# Patient Record
Sex: Female | Born: 1965 | Race: White | Hispanic: Yes | Marital: Married | Smoking: Never smoker
Health system: Southern US, Community
[De-identification: ages and names within clinical notes are randomized; demographics above are authoritative.]

## PROBLEM LIST (undated history)

## (undated) DIAGNOSIS — F32A Depression, unspecified: Secondary | ICD-10-CM

## (undated) DIAGNOSIS — K859 Acute pancreatitis without necrosis or infection, unspecified: Secondary | ICD-10-CM

## (undated) DIAGNOSIS — F419 Anxiety disorder, unspecified: Secondary | ICD-10-CM

## (undated) DIAGNOSIS — F329 Major depressive disorder, single episode, unspecified: Secondary | ICD-10-CM

## (undated) DIAGNOSIS — Z780 Asymptomatic menopausal state: Secondary | ICD-10-CM

## (undated) DIAGNOSIS — D649 Anemia, unspecified: Secondary | ICD-10-CM

## (undated) DIAGNOSIS — E119 Type 2 diabetes mellitus without complications: Secondary | ICD-10-CM

## (undated) DIAGNOSIS — Z992 Dependence on renal dialysis: Secondary | ICD-10-CM

## (undated) DIAGNOSIS — G63 Polyneuropathy in diseases classified elsewhere: Secondary | ICD-10-CM

## (undated) DIAGNOSIS — M1990 Unspecified osteoarthritis, unspecified site: Secondary | ICD-10-CM

## (undated) DIAGNOSIS — Z9221 Personal history of antineoplastic chemotherapy: Secondary | ICD-10-CM

## (undated) DIAGNOSIS — N189 Chronic kidney disease, unspecified: Secondary | ICD-10-CM

## (undated) DIAGNOSIS — A419 Sepsis, unspecified organism: Secondary | ICD-10-CM

## (undated) DIAGNOSIS — J189 Pneumonia, unspecified organism: Secondary | ICD-10-CM

## (undated) DIAGNOSIS — C9000 Multiple myeloma not having achieved remission: Secondary | ICD-10-CM

## (undated) DIAGNOSIS — C801 Malignant (primary) neoplasm, unspecified: Secondary | ICD-10-CM

## (undated) DIAGNOSIS — I10 Essential (primary) hypertension: Secondary | ICD-10-CM

## (undated) HISTORY — DX: Unspecified osteoarthritis, unspecified site: M19.90

## (undated) HISTORY — PX: TUBAL LIGATION: SHX77

## (undated) HISTORY — DX: Asymptomatic menopausal state: Z78.0

## (undated) HISTORY — PX: PORTACATH PLACEMENT: SHX2246

## (undated) HISTORY — DX: Essential (primary) hypertension: I10

## (undated) HISTORY — DX: Multiple myeloma not having achieved remission: C90.00

## (undated) HISTORY — PX: OTHER SURGICAL HISTORY: SHX169

---

## 2007-01-17 ENCOUNTER — Ambulatory Visit: Payer: Self-pay

## 2008-05-15 ENCOUNTER — Ambulatory Visit: Payer: Self-pay | Admitting: Internal Medicine

## 2008-11-05 ENCOUNTER — Ambulatory Visit: Payer: Self-pay

## 2008-11-20 ENCOUNTER — Ambulatory Visit: Payer: Self-pay

## 2011-10-13 ENCOUNTER — Ambulatory Visit: Payer: Self-pay | Admitting: Family Medicine

## 2011-10-20 ENCOUNTER — Ambulatory Visit: Payer: Self-pay | Admitting: Family Medicine

## 2012-03-08 DIAGNOSIS — Z780 Asymptomatic menopausal state: Secondary | ICD-10-CM

## 2012-03-08 HISTORY — DX: Asymptomatic menopausal state: Z78.0

## 2013-08-06 ENCOUNTER — Ambulatory Visit: Payer: Self-pay | Admitting: Oncology

## 2013-08-06 LAB — CBC WITH DIFFERENTIAL/PLATELET
BASOS PCT: 0.6 %
Basophil #: 0.1 10*3/uL (ref 0.0–0.1)
EOS ABS: 0 10*3/uL (ref 0.0–0.7)
EOS PCT: 0.3 %
HCT: 29.2 % — ABNORMAL LOW (ref 35.0–47.0)
HGB: 10.3 g/dL — ABNORMAL LOW (ref 12.0–16.0)
LYMPHS PCT: 30.4 %
Lymphocyte #: 4.2 10*3/uL — ABNORMAL HIGH (ref 1.0–3.6)
MCH: 32.3 pg (ref 26.0–34.0)
MCHC: 35.3 g/dL (ref 32.0–36.0)
MCV: 92 fL (ref 80–100)
Monocyte #: 2 x10 3/mm — ABNORMAL HIGH (ref 0.2–0.9)
Monocyte %: 14.1 %
NEUTROS ABS: 7.6 10*3/uL — AB (ref 1.4–6.5)
Neutrophil %: 54.6 %
Platelet: 263 10*3/uL (ref 150–440)
RBC: 3.19 10*6/uL — ABNORMAL LOW (ref 3.80–5.20)
RDW: 12.7 % (ref 11.5–14.5)
WBC: 13.9 10*3/uL — ABNORMAL HIGH (ref 3.6–11.0)

## 2013-08-07 ENCOUNTER — Inpatient Hospital Stay: Payer: Self-pay | Admitting: Internal Medicine

## 2013-08-07 LAB — URINALYSIS, COMPLETE
Bilirubin,UR: NEGATIVE
Glucose,UR: 50 mg/dL (ref 0–75)
KETONE: NEGATIVE
LEUKOCYTE ESTERASE: NEGATIVE
NITRITE: NEGATIVE
PH: 7 (ref 4.5–8.0)
Protein: 30
SPECIFIC GRAVITY: 1.01 (ref 1.003–1.030)

## 2013-08-07 LAB — BASIC METABOLIC PANEL
Anion Gap: 7 (ref 7–16)
BUN: 47 mg/dL — ABNORMAL HIGH (ref 7–18)
CALCIUM: 11.5 mg/dL — AB (ref 8.5–10.1)
CHLORIDE: 102 mmol/L (ref 98–107)
Co2: 27 mmol/L (ref 21–32)
Creatinine: 4.21 mg/dL — ABNORMAL HIGH (ref 0.60–1.30)
EGFR (African American): 14 — ABNORMAL LOW
GFR CALC NON AF AMER: 12 — AB
GLUCOSE: 191 mg/dL — AB (ref 65–99)
Osmolality: 289 (ref 275–301)
POTASSIUM: 3.3 mmol/L — AB (ref 3.5–5.1)
Sodium: 136 mmol/L (ref 136–145)

## 2013-08-07 LAB — COMPREHENSIVE METABOLIC PANEL
ALK PHOS: 142 U/L — AB
AST: 24 U/L (ref 15–37)
Albumin: 3.6 g/dL (ref 3.4–5.0)
Anion Gap: 15 (ref 7–16)
BILIRUBIN TOTAL: 0.7 mg/dL (ref 0.2–1.0)
BUN: 52 mg/dL — AB (ref 7–18)
CHLORIDE: 92 mmol/L — AB (ref 98–107)
Calcium, Total: 12.5 mg/dL — ABNORMAL HIGH (ref 8.5–10.1)
Co2: 26 mmol/L (ref 21–32)
Creatinine: 4.29 mg/dL — ABNORMAL HIGH (ref 0.60–1.30)
EGFR (African American): 13 — ABNORMAL LOW
EGFR (Non-African Amer.): 11 — ABNORMAL LOW
Glucose: 161 mg/dL — ABNORMAL HIGH (ref 65–99)
OSMOLALITY: 284 (ref 275–301)
Potassium: 3.4 mmol/L — ABNORMAL LOW (ref 3.5–5.1)
SGPT (ALT): 27 U/L (ref 12–78)
Sodium: 133 mmol/L — ABNORMAL LOW (ref 136–145)
TOTAL PROTEIN: 8.3 g/dL — AB (ref 6.4–8.2)

## 2013-08-07 LAB — LIPASE, BLOOD
LIPASE: 892 U/L — AB (ref 73–393)
Lipase: 523 U/L — ABNORMAL HIGH (ref 73–393)

## 2013-08-07 LAB — TROPONIN I: Troponin-I: 0.04 ng/mL

## 2013-08-08 LAB — T4, FREE: FREE THYROXINE: 1.46 ng/dL (ref 0.76–1.46)

## 2013-08-08 LAB — CBC WITH DIFFERENTIAL/PLATELET
BASOS PCT: 0.4 %
Basophil #: 0 10*3/uL (ref 0.0–0.1)
EOS PCT: 0.4 %
Eosinophil #: 0 10*3/uL (ref 0.0–0.7)
HCT: 24.4 % — AB (ref 35.0–47.0)
HGB: 8.7 g/dL — AB (ref 12.0–16.0)
LYMPHS ABS: 1.9 10*3/uL (ref 1.0–3.6)
Lymphocyte %: 25.6 %
MCH: 32.9 pg (ref 26.0–34.0)
MCHC: 35.5 g/dL (ref 32.0–36.0)
MCV: 93 fL (ref 80–100)
MONO ABS: 0.7 x10 3/mm (ref 0.2–0.9)
Monocyte %: 9.4 %
NEUTROS PCT: 64.2 %
Neutrophil #: 4.8 10*3/uL (ref 1.4–6.5)
Platelet: 185 10*3/uL (ref 150–440)
RBC: 2.63 10*6/uL — ABNORMAL LOW (ref 3.80–5.20)
RDW: 12.8 % (ref 11.5–14.5)
WBC: 7.5 10*3/uL (ref 3.6–11.0)

## 2013-08-08 LAB — COMPREHENSIVE METABOLIC PANEL
ALK PHOS: 110 U/L
AST: 25 U/L (ref 15–37)
Albumin: 2.6 g/dL — ABNORMAL LOW (ref 3.4–5.0)
Anion Gap: 8 (ref 7–16)
BUN: 40 mg/dL — ABNORMAL HIGH (ref 7–18)
Bilirubin,Total: 0.4 mg/dL (ref 0.2–1.0)
CHLORIDE: 109 mmol/L — AB (ref 98–107)
CO2: 23 mmol/L (ref 21–32)
Calcium, Total: 10.9 mg/dL — ABNORMAL HIGH (ref 8.5–10.1)
Creatinine: 4.12 mg/dL — ABNORMAL HIGH (ref 0.60–1.30)
EGFR (Non-African Amer.): 12 — ABNORMAL LOW
GFR CALC AF AMER: 14 — AB
GLUCOSE: 186 mg/dL — AB (ref 65–99)
Osmolality: 294 (ref 275–301)
POTASSIUM: 3.5 mmol/L (ref 3.5–5.1)
SGPT (ALT): 20 U/L (ref 12–78)
SODIUM: 140 mmol/L (ref 136–145)
TOTAL PROTEIN: 6.6 g/dL (ref 6.4–8.2)

## 2013-08-08 LAB — MAGNESIUM: MAGNESIUM: 1.5 mg/dL — AB

## 2013-08-08 LAB — TSH: Thyroid Stimulating Horm: 0.278 u[IU]/mL — ABNORMAL LOW

## 2013-08-08 LAB — URINE CULTURE

## 2013-08-09 LAB — BASIC METABOLIC PANEL
Anion Gap: 8 (ref 7–16)
BUN: 34 mg/dL — ABNORMAL HIGH (ref 7–18)
CHLORIDE: 110 mmol/L — AB (ref 98–107)
CO2: 22 mmol/L (ref 21–32)
Calcium, Total: 9.1 mg/dL (ref 8.5–10.1)
Creatinine: 3.67 mg/dL — ABNORMAL HIGH (ref 0.60–1.30)
EGFR (Non-African Amer.): 14 — ABNORMAL LOW
GFR CALC AF AMER: 16 — AB
Glucose: 208 mg/dL — ABNORMAL HIGH (ref 65–99)
Osmolality: 293 (ref 275–301)
POTASSIUM: 3.4 mmol/L — AB (ref 3.5–5.1)
Sodium: 140 mmol/L (ref 136–145)

## 2013-08-09 LAB — HEMOGLOBIN: HGB: 7.8 g/dL — ABNORMAL LOW (ref 12.0–16.0)

## 2013-08-09 LAB — LIPASE, BLOOD: LIPASE: 798 U/L — AB (ref 73–393)

## 2013-08-10 LAB — BASIC METABOLIC PANEL
ANION GAP: 9 (ref 7–16)
BUN: 26 mg/dL — ABNORMAL HIGH (ref 7–18)
CREATININE: 3.25 mg/dL — AB (ref 0.60–1.30)
Calcium, Total: 8.6 mg/dL (ref 8.5–10.1)
Chloride: 112 mmol/L — ABNORMAL HIGH (ref 98–107)
Co2: 22 mmol/L (ref 21–32)
EGFR (African American): 19 — ABNORMAL LOW
GFR CALC NON AF AMER: 16 — AB
Glucose: 155 mg/dL — ABNORMAL HIGH (ref 65–99)
OSMOLALITY: 293 (ref 275–301)
POTASSIUM: 3.3 mmol/L — AB (ref 3.5–5.1)
SODIUM: 143 mmol/L (ref 136–145)

## 2013-08-10 LAB — FERRITIN: Ferritin (ARMC): 772 ng/mL — ABNORMAL HIGH (ref 8–388)

## 2013-08-10 LAB — IRON AND TIBC
Iron Bind.Cap.(Total): 172 ug/dL — ABNORMAL LOW (ref 250–450)
Iron Saturation: 49 %
Iron: 84 ug/dL (ref 50–170)
Unbound Iron-Bind.Cap.: 88 ug/dL

## 2013-08-10 LAB — MAGNESIUM: MAGNESIUM: 1.8 mg/dL

## 2013-08-10 LAB — HEMOGLOBIN: HGB: 7.8 g/dL — AB (ref 12.0–16.0)

## 2013-08-10 LAB — HEMOGLOBIN A1C: Hemoglobin A1C: 8.7 % — ABNORMAL HIGH (ref 4.2–6.3)

## 2013-08-10 LAB — LIPASE, BLOOD: Lipase: 687 U/L — ABNORMAL HIGH (ref 73–393)

## 2013-08-13 ENCOUNTER — Ambulatory Visit: Payer: Self-pay | Admitting: Oncology

## 2013-08-13 LAB — CBC CANCER CENTER
BASOS PCT: 1.1 %
Bands: 2 %
Basophil #: 0.1 x10 3/mm (ref 0.0–0.1)
Basophil: 2 %
COMMENT - H1-COM2: NORMAL
EOS PCT: 2 %
Eosinophil #: 0.1 x10 3/mm (ref 0.0–0.7)
Eosinophil %: 1.2 %
HCT: 26.1 % — ABNORMAL LOW (ref 35.0–47.0)
HGB: 9.1 g/dL — ABNORMAL LOW (ref 12.0–16.0)
Lymphocyte #: 2.6 x10 3/mm (ref 1.0–3.6)
Lymphocyte %: 27.2 %
Lymphocytes: 30 %
MCH: 32.1 pg (ref 26.0–34.0)
MCHC: 34.8 g/dL (ref 32.0–36.0)
MCV: 92 fL (ref 80–100)
Monocyte #: 1 x10 3/mm — ABNORMAL HIGH (ref 0.2–0.9)
Monocyte %: 10.7 %
Monocytes: 12 %
NEUTROS PCT: 59.8 %
Neutrophil #: 5.8 x10 3/mm (ref 1.4–6.5)
PLATELETS: 225 x10 3/mm (ref 150–440)
RBC: 2.83 10*6/uL — ABNORMAL LOW (ref 3.80–5.20)
RDW: 13.5 % (ref 11.5–14.5)
Segmented Neutrophils: 51 %
Variant Lymphocyte: 1 %
WBC: 9.7 x10 3/mm (ref 3.6–11.0)

## 2013-08-13 LAB — COMPREHENSIVE METABOLIC PANEL
Albumin: 3.5 g/dL (ref 3.4–5.0)
Alkaline Phosphatase: 200 U/L — ABNORMAL HIGH
Anion Gap: 11 (ref 7–16)
BUN: 36 mg/dL — ABNORMAL HIGH (ref 7–18)
Bilirubin,Total: 0.5 mg/dL (ref 0.2–1.0)
CALCIUM: 7.7 mg/dL — AB (ref 8.5–10.1)
Chloride: 106 mmol/L (ref 98–107)
Co2: 22 mmol/L (ref 21–32)
Creatinine: 2.48 mg/dL — ABNORMAL HIGH (ref 0.60–1.30)
EGFR (African American): 26 — ABNORMAL LOW
EGFR (Non-African Amer.): 22 — ABNORMAL LOW
GLUCOSE: 156 mg/dL — AB (ref 65–99)
OSMOLALITY: 289 (ref 275–301)
Potassium: 5 mmol/L (ref 3.5–5.1)
SGOT(AST): 64 U/L — ABNORMAL HIGH (ref 15–37)
SGPT (ALT): 124 U/L — ABNORMAL HIGH (ref 12–78)
Sodium: 139 mmol/L (ref 136–145)
Total Protein: 8.4 g/dL — ABNORMAL HIGH (ref 6.4–8.2)

## 2013-08-15 LAB — PROT IMMUNOELECTROPHORES(ARMC)

## 2013-08-15 LAB — KAPPA/LAMBDA FREE LIGHT CHAINS (ARMC)

## 2013-08-15 LAB — PES,RFLX IFE+FREE K/L LT

## 2013-08-20 LAB — CBC CANCER CENTER
BASOS ABS: 0.1 x10 3/mm (ref 0.0–0.1)
BASOS PCT: 1.1 %
EOS PCT: 0.8 %
Eosinophil #: 0 x10 3/mm (ref 0.0–0.7)
HCT: 26.6 % — ABNORMAL LOW (ref 35.0–47.0)
HGB: 9.2 g/dL — ABNORMAL LOW (ref 12.0–16.0)
Lymphocyte #: 1.7 x10 3/mm (ref 1.0–3.6)
Lymphocyte %: 32.5 %
MCH: 32.6 pg (ref 26.0–34.0)
MCHC: 34.7 g/dL (ref 32.0–36.0)
MCV: 94 fL (ref 80–100)
MONOS PCT: 9.1 %
Monocyte #: 0.5 x10 3/mm (ref 0.2–0.9)
NEUTROS PCT: 56.5 %
Neutrophil #: 3 x10 3/mm (ref 1.4–6.5)
PLATELETS: 311 x10 3/mm (ref 150–440)
RBC: 2.83 10*6/uL — ABNORMAL LOW (ref 3.80–5.20)
RDW: 13.6 % (ref 11.5–14.5)
WBC: 5.4 x10 3/mm (ref 3.6–11.0)

## 2013-08-23 LAB — CBC CANCER CENTER
BASOS ABS: 0 x10 3/mm (ref 0.0–0.1)
Basophil %: 0.8 %
Eosinophil #: 0 x10 3/mm (ref 0.0–0.7)
Eosinophil %: 0.8 %
HCT: 26.4 % — ABNORMAL LOW (ref 35.0–47.0)
HGB: 9.3 g/dL — ABNORMAL LOW (ref 12.0–16.0)
LYMPHS PCT: 31.1 %
Lymphocyte #: 1.6 x10 3/mm (ref 1.0–3.6)
MCH: 33.4 pg (ref 26.0–34.0)
MCHC: 35.3 g/dL (ref 32.0–36.0)
MCV: 95 fL (ref 80–100)
MONOS PCT: 12.9 %
Monocyte #: 0.6 x10 3/mm (ref 0.2–0.9)
NEUTROS ABS: 2.7 x10 3/mm (ref 1.4–6.5)
Neutrophil %: 54.4 %
Platelet: 277 x10 3/mm (ref 150–440)
RBC: 2.78 10*6/uL — AB (ref 3.80–5.20)
RDW: 13.7 % (ref 11.5–14.5)
WBC: 5 x10 3/mm (ref 3.6–11.0)

## 2013-08-27 LAB — CBC CANCER CENTER
BASOS PCT: 0.7 %
Basophil #: 0 x10 3/mm (ref 0.0–0.1)
EOS PCT: 0.7 %
Eosinophil #: 0 x10 3/mm (ref 0.0–0.7)
HCT: 26.9 % — AB (ref 35.0–47.0)
HGB: 9.3 g/dL — ABNORMAL LOW (ref 12.0–16.0)
Lymphocyte #: 1.6 x10 3/mm (ref 1.0–3.6)
Lymphocyte %: 30.5 %
MCH: 32.6 pg (ref 26.0–34.0)
MCHC: 34.5 g/dL (ref 32.0–36.0)
MCV: 94 fL (ref 80–100)
MONO ABS: 0.8 x10 3/mm (ref 0.2–0.9)
Monocyte %: 14.7 %
NEUTROS ABS: 2.8 x10 3/mm (ref 1.4–6.5)
Neutrophil %: 53.4 %
PLATELETS: 191 x10 3/mm (ref 150–440)
RBC: 2.84 10*6/uL — AB (ref 3.80–5.20)
RDW: 13.9 % (ref 11.5–14.5)
WBC: 5.3 x10 3/mm (ref 3.6–11.0)

## 2013-08-30 LAB — COMPREHENSIVE METABOLIC PANEL
ALT: 46 U/L (ref 12–78)
ANION GAP: 7 (ref 7–16)
Albumin: 3.4 g/dL (ref 3.4–5.0)
Alkaline Phosphatase: 665 U/L — ABNORMAL HIGH
BUN: 32 mg/dL — ABNORMAL HIGH (ref 7–18)
Bilirubin,Total: 0.3 mg/dL (ref 0.2–1.0)
CALCIUM: 8.5 mg/dL (ref 8.5–10.1)
CREATININE: 1.39 mg/dL — AB (ref 0.60–1.30)
Chloride: 103 mmol/L (ref 98–107)
Co2: 27 mmol/L (ref 21–32)
EGFR (African American): 52 — ABNORMAL LOW
EGFR (Non-African Amer.): 45 — ABNORMAL LOW
GLUCOSE: 81 mg/dL (ref 65–99)
Osmolality: 280 (ref 275–301)
Potassium: 5.3 mmol/L — ABNORMAL HIGH (ref 3.5–5.1)
SGOT(AST): 25 U/L (ref 15–37)
SODIUM: 137 mmol/L (ref 136–145)
TOTAL PROTEIN: 7.3 g/dL (ref 6.4–8.2)

## 2013-08-30 LAB — CBC CANCER CENTER
BASOS PCT: 0.6 %
Basophil #: 0 x10 3/mm (ref 0.0–0.1)
Eosinophil #: 0.1 x10 3/mm (ref 0.0–0.7)
Eosinophil %: 0.7 %
HCT: 26.4 % — ABNORMAL LOW (ref 35.0–47.0)
HGB: 9.2 g/dL — AB (ref 12.0–16.0)
LYMPHS ABS: 1.9 x10 3/mm (ref 1.0–3.6)
LYMPHS PCT: 27.3 %
MCH: 32.8 pg (ref 26.0–34.0)
MCHC: 34.8 g/dL (ref 32.0–36.0)
MCV: 94 fL (ref 80–100)
MONOS PCT: 15.7 %
Monocyte #: 1.1 x10 3/mm — ABNORMAL HIGH (ref 0.2–0.9)
Neutrophil #: 3.9 x10 3/mm (ref 1.4–6.5)
Neutrophil %: 55.7 %
Platelet: 138 x10 3/mm — ABNORMAL LOW (ref 150–440)
RBC: 2.8 10*6/uL — ABNORMAL LOW (ref 3.80–5.20)
RDW: 14.1 % (ref 11.5–14.5)
WBC: 7.1 x10 3/mm (ref 3.6–11.0)

## 2013-09-03 LAB — CBC CANCER CENTER
BASOS ABS: 0 x10 3/mm (ref 0.0–0.1)
Basophil %: 0.2 %
EOS ABS: 0 x10 3/mm (ref 0.0–0.7)
Eosinophil %: 0.5 %
HCT: 24.5 % — ABNORMAL LOW (ref 35.0–47.0)
HGB: 8.5 g/dL — ABNORMAL LOW (ref 12.0–16.0)
LYMPHS PCT: 20.8 %
Lymphocyte #: 0.8 x10 3/mm — ABNORMAL LOW (ref 1.0–3.6)
MCH: 32.6 pg (ref 26.0–34.0)
MCHC: 34.7 g/dL (ref 32.0–36.0)
MCV: 94 fL (ref 80–100)
MONO ABS: 0.5 x10 3/mm (ref 0.2–0.9)
MONOS PCT: 11.7 %
NEUTROS ABS: 2.7 x10 3/mm (ref 1.4–6.5)
Neutrophil %: 66.8 %
Platelet: 131 x10 3/mm — ABNORMAL LOW (ref 150–440)
RBC: 2.6 10*6/uL — ABNORMAL LOW (ref 3.80–5.20)
RDW: 14 % (ref 11.5–14.5)
WBC: 4 x10 3/mm (ref 3.6–11.0)

## 2013-09-05 ENCOUNTER — Ambulatory Visit: Payer: Self-pay | Admitting: Oncology

## 2013-09-06 ENCOUNTER — Ambulatory Visit: Payer: Self-pay | Admitting: Vascular Surgery

## 2013-09-13 LAB — CBC CANCER CENTER
BASOS PCT: 0.9 %
Basophil #: 0 x10 3/mm (ref 0.0–0.1)
EOS PCT: 1.1 %
Eosinophil #: 0 x10 3/mm (ref 0.0–0.7)
HCT: 23.8 % — ABNORMAL LOW (ref 35.0–47.0)
HGB: 8.3 g/dL — AB (ref 12.0–16.0)
LYMPHS ABS: 0.6 x10 3/mm — AB (ref 1.0–3.6)
Lymphocyte %: 21.4 %
MCH: 33.2 pg (ref 26.0–34.0)
MCHC: 35 g/dL (ref 32.0–36.0)
MCV: 95 fL (ref 80–100)
Monocyte #: 0.4 x10 3/mm (ref 0.2–0.9)
Monocyte %: 13.4 %
Neutrophil #: 1.7 x10 3/mm (ref 1.4–6.5)
Neutrophil %: 63.2 %
Platelet: 274 x10 3/mm (ref 150–440)
RBC: 2.51 10*6/uL — ABNORMAL LOW (ref 3.80–5.20)
RDW: 14.6 % — ABNORMAL HIGH (ref 11.5–14.5)
WBC: 2.7 x10 3/mm — ABNORMAL LOW (ref 3.6–11.0)

## 2013-09-13 LAB — COMPREHENSIVE METABOLIC PANEL
AST: 27 U/L (ref 15–37)
Albumin: 3.4 g/dL (ref 3.4–5.0)
Alkaline Phosphatase: 1052 U/L — ABNORMAL HIGH
Anion Gap: 11 (ref 7–16)
BUN: 19 mg/dL — AB (ref 7–18)
Bilirubin,Total: 0.2 mg/dL (ref 0.2–1.0)
CHLORIDE: 106 mmol/L (ref 98–107)
Calcium, Total: 8.6 mg/dL (ref 8.5–10.1)
Co2: 24 mmol/L (ref 21–32)
Creatinine: 1.3 mg/dL (ref 0.60–1.30)
EGFR (Non-African Amer.): 48 — ABNORMAL LOW
GFR CALC AF AMER: 56 — AB
Glucose: 193 mg/dL — ABNORMAL HIGH (ref 65–99)
Osmolality: 289 (ref 275–301)
Potassium: 4 mmol/L (ref 3.5–5.1)
SGPT (ALT): 38 U/L (ref 12–78)
SODIUM: 141 mmol/L (ref 136–145)
TOTAL PROTEIN: 6.9 g/dL (ref 6.4–8.2)

## 2013-09-14 LAB — PROT IMMUNOELECTROPHORES(ARMC)

## 2013-09-14 LAB — KAPPA/LAMBDA FREE LIGHT CHAINS (ARMC)

## 2013-09-17 LAB — CBC CANCER CENTER
Basophil #: 0 x10 3/mm (ref 0.0–0.1)
Basophil %: 0.8 %
EOS PCT: 2.8 %
Eosinophil #: 0.1 x10 3/mm (ref 0.0–0.7)
HCT: 24.8 % — AB (ref 35.0–47.0)
HGB: 8.7 g/dL — AB (ref 12.0–16.0)
Lymphocyte #: 0.5 x10 3/mm — ABNORMAL LOW (ref 1.0–3.6)
Lymphocyte %: 21.5 %
MCH: 32.9 pg (ref 26.0–34.0)
MCHC: 35 g/dL (ref 32.0–36.0)
MCV: 94 fL (ref 80–100)
MONO ABS: 0.5 x10 3/mm (ref 0.2–0.9)
Monocyte %: 20.4 %
NEUTROS ABS: 1.3 x10 3/mm — AB (ref 1.4–6.5)
NEUTROS PCT: 54.5 %
Platelet: 199 x10 3/mm (ref 150–440)
RBC: 2.64 10*6/uL — ABNORMAL LOW (ref 3.80–5.20)
RDW: 14.2 % (ref 11.5–14.5)
WBC: 2.3 x10 3/mm — AB (ref 3.6–11.0)

## 2013-09-20 LAB — CBC CANCER CENTER
Basophil #: 0 x10 3/mm (ref 0.0–0.1)
Basophil %: 0.1 %
EOS ABS: 0 x10 3/mm (ref 0.0–0.7)
Eosinophil %: 0.3 %
HCT: 25.4 % — AB (ref 35.0–47.0)
HGB: 8.9 g/dL — AB (ref 12.0–16.0)
LYMPHS PCT: 13.6 %
Lymphocyte #: 0.6 x10 3/mm — ABNORMAL LOW (ref 1.0–3.6)
MCH: 33.3 pg (ref 26.0–34.0)
MCHC: 35.1 g/dL (ref 32.0–36.0)
MCV: 95 fL (ref 80–100)
Monocyte #: 0.9 x10 3/mm (ref 0.2–0.9)
Monocyte %: 21.3 %
NEUTROS PCT: 64.7 %
Neutrophil #: 2.7 x10 3/mm (ref 1.4–6.5)
Platelet: 149 x10 3/mm — ABNORMAL LOW (ref 150–440)
RBC: 2.68 10*6/uL — ABNORMAL LOW (ref 3.80–5.20)
RDW: 14.4 % (ref 11.5–14.5)
WBC: 4.2 x10 3/mm (ref 3.6–11.0)

## 2013-09-20 LAB — BASIC METABOLIC PANEL
Anion Gap: 13 (ref 7–16)
BUN: 16 mg/dL (ref 7–18)
CHLORIDE: 106 mmol/L (ref 98–107)
CO2: 22 mmol/L (ref 21–32)
Calcium, Total: 8.7 mg/dL (ref 8.5–10.1)
Creatinine: 1.22 mg/dL (ref 0.60–1.30)
EGFR (African American): >60
GFR CALC NON AF AMER: 52 — AB
Glucose: 108 mg/dL — ABNORMAL HIGH (ref 65–99)
Osmolality: 283 (ref 275–301)
Potassium: 3.4 mmol/L — ABNORMAL LOW (ref 3.5–5.1)
SODIUM: 141 mmol/L (ref 136–145)

## 2013-09-24 LAB — CBC CANCER CENTER
BASOS PCT: 0.2 %
Basophil #: 0 x10 3/mm (ref 0.0–0.1)
EOS ABS: 0.1 x10 3/mm (ref 0.0–0.7)
Eosinophil %: 1.9 %
HCT: 26 % — ABNORMAL LOW (ref 35.0–47.0)
HGB: 9.1 g/dL — AB (ref 12.0–16.0)
Lymphocyte #: 0.5 x10 3/mm — ABNORMAL LOW (ref 1.0–3.6)
Lymphocyte %: 9.2 %
MCH: 33.6 pg (ref 26.0–34.0)
MCHC: 35.1 g/dL (ref 32.0–36.0)
MCV: 96 fL (ref 80–100)
Monocyte #: 0.6 x10 3/mm (ref 0.2–0.9)
Monocyte %: 12 %
Neutrophil #: 3.8 x10 3/mm (ref 1.4–6.5)
Neutrophil %: 76.7 %
Platelet: 127 x10 3/mm — ABNORMAL LOW (ref 150–440)
RBC: 2.72 10*6/uL — ABNORMAL LOW (ref 3.80–5.20)
RDW: 14.8 % — ABNORMAL HIGH (ref 11.5–14.5)
WBC: 4.9 x10 3/mm (ref 3.6–11.0)

## 2013-09-27 LAB — CBC CANCER CENTER
BASOS PCT: 0.5 %
Basophil #: 0 x10 3/mm (ref 0.0–0.1)
Eosinophil #: 0.1 x10 3/mm (ref 0.0–0.7)
Eosinophil %: 1.7 %
HCT: 24.2 % — AB (ref 35.0–47.0)
HGB: 8.4 g/dL — ABNORMAL LOW (ref 12.0–16.0)
LYMPHS PCT: 12.1 %
Lymphocyte #: 0.5 x10 3/mm — ABNORMAL LOW (ref 1.0–3.6)
MCH: 33.1 pg (ref 26.0–34.0)
MCHC: 34.6 g/dL (ref 32.0–36.0)
MCV: 96 fL (ref 80–100)
MONOS PCT: 19.7 %
Monocyte #: 0.7 x10 3/mm (ref 0.2–0.9)
NEUTROS PCT: 66 %
Neutrophil #: 2.4 x10 3/mm (ref 1.4–6.5)
PLATELETS: 101 x10 3/mm — AB (ref 150–440)
RBC: 2.53 10*6/uL — ABNORMAL LOW (ref 3.80–5.20)
RDW: 15.3 % — ABNORMAL HIGH (ref 11.5–14.5)
WBC: 3.7 x10 3/mm (ref 3.6–11.0)

## 2013-09-27 LAB — COMPREHENSIVE METABOLIC PANEL
ANION GAP: 6 — AB (ref 7–16)
Albumin: 3.2 g/dL — ABNORMAL LOW (ref 3.4–5.0)
Alkaline Phosphatase: 360 U/L — ABNORMAL HIGH
BUN: 19 mg/dL — ABNORMAL HIGH (ref 7–18)
Bilirubin,Total: 0.3 mg/dL (ref 0.2–1.0)
CHLORIDE: 106 mmol/L (ref 98–107)
Calcium, Total: 8.4 mg/dL — ABNORMAL LOW (ref 8.5–10.1)
Co2: 26 mmol/L (ref 21–32)
Creatinine: 1.13 mg/dL (ref 0.60–1.30)
EGFR (Non-African Amer.): 57 — ABNORMAL LOW
Glucose: 129 mg/dL — ABNORMAL HIGH (ref 65–99)
Osmolality: 280 (ref 275–301)
Potassium: 4.1 mmol/L (ref 3.5–5.1)
SGOT(AST): 34 U/L (ref 15–37)
SGPT (ALT): 49 U/L
SODIUM: 138 mmol/L (ref 136–145)
Total Protein: 6.3 g/dL — ABNORMAL LOW (ref 6.4–8.2)

## 2013-10-01 ENCOUNTER — Ambulatory Visit: Payer: Self-pay | Admitting: Gastroenterology

## 2013-10-06 ENCOUNTER — Ambulatory Visit: Payer: Self-pay | Admitting: Oncology

## 2013-10-08 LAB — COMPREHENSIVE METABOLIC PANEL
ALK PHOS: 223 U/L — AB
ALT: 43 U/L
Albumin: 3.4 g/dL (ref 3.4–5.0)
Anion Gap: 8 (ref 7–16)
BILIRUBIN TOTAL: 0.3 mg/dL (ref 0.2–1.0)
BUN: 19 mg/dL — AB (ref 7–18)
Calcium, Total: 8 mg/dL — ABNORMAL LOW (ref 8.5–10.1)
Chloride: 106 mmol/L (ref 98–107)
Co2: 25 mmol/L (ref 21–32)
Creatinine: 1.19 mg/dL (ref 0.60–1.30)
EGFR (African American): >60
EGFR (Non-African Amer.): 54 — ABNORMAL LOW
Glucose: 145 mg/dL — ABNORMAL HIGH (ref 65–99)
Osmolality: 282 (ref 275–301)
Potassium: 4 mmol/L (ref 3.5–5.1)
SGOT(AST): 28 U/L (ref 15–37)
SODIUM: 139 mmol/L (ref 136–145)
Total Protein: 6.8 g/dL (ref 6.4–8.2)

## 2013-10-08 LAB — CBC CANCER CENTER
BASOS PCT: 1 %
Basophil #: 0 x10 3/mm (ref 0.0–0.1)
Eosinophil #: 0.1 x10 3/mm (ref 0.0–0.7)
Eosinophil %: 2.8 %
HCT: 25.5 % — ABNORMAL LOW (ref 35.0–47.0)
HGB: 8.9 g/dL — ABNORMAL LOW (ref 12.0–16.0)
LYMPHS PCT: 20.5 %
Lymphocyte #: 0.9 x10 3/mm — ABNORMAL LOW (ref 1.0–3.6)
MCH: 33.5 pg (ref 26.0–34.0)
MCHC: 34.7 g/dL (ref 32.0–36.0)
MCV: 97 fL (ref 80–100)
Monocyte #: 0.7 x10 3/mm (ref 0.2–0.9)
Monocyte %: 15.8 %
NEUTROS ABS: 2.6 x10 3/mm (ref 1.4–6.5)
NEUTROS PCT: 59.9 %
PLATELETS: 265 x10 3/mm (ref 150–440)
RBC: 2.64 10*6/uL — AB (ref 3.80–5.20)
RDW: 14.8 % — AB (ref 11.5–14.5)
WBC: 4.3 x10 3/mm (ref 3.6–11.0)

## 2013-10-10 LAB — PROT IMMUNOELECTROPHORES(ARMC)

## 2013-10-10 LAB — KAPPA/LAMBDA FREE LIGHT CHAINS (ARMC)

## 2013-10-18 LAB — PROTEIN, URINE, 24 HOUR
Collection Hours: 24 hours
PROTEIN, URINE: 9 mg/dL (ref 0–12)
Protein, 24 Hour Urine: 230 mg/24HR — ABNORMAL HIGH (ref 30–149)
Total Volume: 2550 mL

## 2013-10-29 LAB — COMPREHENSIVE METABOLIC PANEL
ALT: 43 U/L
Albumin: 3.4 g/dL (ref 3.4–5.0)
Alkaline Phosphatase: 157 U/L — ABNORMAL HIGH
Anion Gap: 12 (ref 7–16)
BUN: 23 mg/dL — AB (ref 7–18)
Bilirubin,Total: 0.2 mg/dL (ref 0.2–1.0)
CALCIUM: 8.4 mg/dL — AB (ref 8.5–10.1)
Chloride: 105 mmol/L (ref 98–107)
Co2: 24 mmol/L (ref 21–32)
Creatinine: 1.26 mg/dL (ref 0.60–1.30)
GFR CALC AF AMER: 58 — AB
GFR CALC NON AF AMER: 50 — AB
Glucose: 157 mg/dL — ABNORMAL HIGH (ref 65–99)
Osmolality: 288 (ref 275–301)
Potassium: 4.1 mmol/L (ref 3.5–5.1)
SGOT(AST): 31 U/L (ref 15–37)
Sodium: 141 mmol/L (ref 136–145)
TOTAL PROTEIN: 6.7 g/dL (ref 6.4–8.2)

## 2013-10-29 LAB — CBC CANCER CENTER
Basophil #: 0 x10 3/mm (ref 0.0–0.1)
Basophil %: 0.8 %
EOS ABS: 0 x10 3/mm (ref 0.0–0.7)
Eosinophil %: 1.1 %
HCT: 30.4 % — AB (ref 35.0–47.0)
HGB: 10.4 g/dL — ABNORMAL LOW (ref 12.0–16.0)
LYMPHS PCT: 15.1 %
Lymphocyte #: 0.7 x10 3/mm — ABNORMAL LOW (ref 1.0–3.6)
MCH: 33.4 pg (ref 26.0–34.0)
MCHC: 34.2 g/dL (ref 32.0–36.0)
MCV: 98 fL (ref 80–100)
MONOS PCT: 11.9 %
Monocyte #: 0.5 x10 3/mm (ref 0.2–0.9)
NEUTROS ABS: 3.2 x10 3/mm (ref 1.4–6.5)
Neutrophil %: 71.1 %
Platelet: 319 x10 3/mm (ref 150–440)
RBC: 3.11 10*6/uL — AB (ref 3.80–5.20)
RDW: 14.6 % — AB (ref 11.5–14.5)
WBC: 4.5 x10 3/mm (ref 3.6–11.0)

## 2013-10-30 LAB — URINE IEP, RANDOM

## 2013-10-31 LAB — PROT IMMUNOELECTROPHORES(ARMC)

## 2013-11-06 ENCOUNTER — Ambulatory Visit: Payer: Self-pay | Admitting: Oncology

## 2013-11-13 LAB — CBC CANCER CENTER
BASOS ABS: 0 x10 3/mm (ref 0.0–0.1)
Basophil %: 0.2 %
EOS PCT: 0.9 %
Eosinophil #: 0 x10 3/mm (ref 0.0–0.7)
HCT: 32 % — AB (ref 35.0–47.0)
HGB: 11.1 g/dL — AB (ref 12.0–16.0)
LYMPHS ABS: 0.7 x10 3/mm — AB (ref 1.0–3.6)
Lymphocyte %: 13.5 %
MCH: 33.3 pg (ref 26.0–34.0)
MCHC: 34.7 g/dL (ref 32.0–36.0)
MCV: 96 fL (ref 80–100)
MONOS PCT: 14.8 %
Monocyte #: 0.8 x10 3/mm (ref 0.2–0.9)
Neutrophil #: 3.6 x10 3/mm (ref 1.4–6.5)
Neutrophil %: 70.6 %
Platelet: 126 x10 3/mm — ABNORMAL LOW (ref 150–440)
RBC: 3.34 10*6/uL — AB (ref 3.80–5.20)
RDW: 13.8 % (ref 11.5–14.5)
WBC: 5.1 x10 3/mm (ref 3.6–11.0)

## 2013-11-13 LAB — COMPREHENSIVE METABOLIC PANEL
ALBUMIN: 3.5 g/dL (ref 3.4–5.0)
ALT: 41 U/L
Alkaline Phosphatase: 122 U/L — ABNORMAL HIGH
Anion Gap: 8 (ref 7–16)
BUN: 25 mg/dL — AB (ref 7–18)
Bilirubin,Total: 0.4 mg/dL (ref 0.2–1.0)
Calcium, Total: 8.6 mg/dL (ref 8.5–10.1)
Chloride: 103 mmol/L (ref 98–107)
Co2: 26 mmol/L (ref 21–32)
Creatinine: 1.19 mg/dL (ref 0.60–1.30)
EGFR (African American): >60
GFR CALC NON AF AMER: 54 — AB
Glucose: 139 mg/dL — ABNORMAL HIGH (ref 65–99)
Osmolality: 280 (ref 275–301)
POTASSIUM: 4.1 mmol/L (ref 3.5–5.1)
SGOT(AST): 30 U/L (ref 15–37)
Sodium: 137 mmol/L (ref 136–145)
TOTAL PROTEIN: 6.9 g/dL (ref 6.4–8.2)

## 2013-11-19 LAB — CBC CANCER CENTER
BASOS PCT: 0.6 %
Basophil #: 0 x10 3/mm (ref 0.0–0.1)
EOS ABS: 0 x10 3/mm (ref 0.0–0.7)
Eosinophil %: 0.7 %
HCT: 32.1 % — ABNORMAL LOW (ref 35.0–47.0)
HGB: 11 g/dL — ABNORMAL LOW (ref 12.0–16.0)
LYMPHS ABS: 0.4 x10 3/mm — AB (ref 1.0–3.6)
Lymphocyte %: 9.4 %
MCH: 32.7 pg (ref 26.0–34.0)
MCHC: 34.2 g/dL (ref 32.0–36.0)
MCV: 96 fL (ref 80–100)
MONO ABS: 0.7 x10 3/mm (ref 0.2–0.9)
Monocyte %: 16.7 %
NEUTROS ABS: 3.1 x10 3/mm (ref 1.4–6.5)
NEUTROS PCT: 72.6 %
Platelet: 305 x10 3/mm (ref 150–440)
RBC: 3.35 10*6/uL — ABNORMAL LOW (ref 3.80–5.20)
RDW: 13.8 % (ref 11.5–14.5)
WBC: 4.3 x10 3/mm (ref 3.6–11.0)

## 2013-11-19 LAB — COMPREHENSIVE METABOLIC PANEL
ALT: 35 U/L
ANION GAP: 9 (ref 7–16)
Albumin: 3.6 g/dL (ref 3.4–5.0)
Alkaline Phosphatase: 106 U/L
BUN: 24 mg/dL — ABNORMAL HIGH (ref 7–18)
Bilirubin,Total: 0.2 mg/dL (ref 0.2–1.0)
CHLORIDE: 106 mmol/L (ref 98–107)
Calcium, Total: 8.9 mg/dL (ref 8.5–10.1)
Co2: 25 mmol/L (ref 21–32)
Creatinine: 1.09 mg/dL (ref 0.60–1.30)
EGFR (African American): >60
GFR CALC NON AF AMER: 60 — AB
GLUCOSE: 124 mg/dL — AB (ref 65–99)
OSMOLALITY: 285 (ref 275–301)
Potassium: 3.9 mmol/L (ref 3.5–5.1)
SGOT(AST): 25 U/L (ref 15–37)
Sodium: 140 mmol/L (ref 136–145)
TOTAL PROTEIN: 6.9 g/dL (ref 6.4–8.2)

## 2013-11-21 LAB — URINE IEP, RANDOM

## 2013-11-22 LAB — PROT IMMUNOELECTROPHORES(ARMC)

## 2013-12-06 ENCOUNTER — Ambulatory Visit: Payer: Self-pay | Admitting: Oncology

## 2013-12-10 LAB — COMPREHENSIVE METABOLIC PANEL
ANION GAP: 9 (ref 7–16)
Albumin: 3.7 g/dL (ref 3.4–5.0)
Alkaline Phosphatase: 86 U/L
BUN: 33 mg/dL — ABNORMAL HIGH (ref 7–18)
Bilirubin,Total: 0.2 mg/dL (ref 0.2–1.0)
CALCIUM: 8.8 mg/dL (ref 8.5–10.1)
CHLORIDE: 108 mmol/L — AB (ref 98–107)
CREATININE: 1.4 mg/dL — AB (ref 0.60–1.30)
Co2: 24 mmol/L (ref 21–32)
EGFR (Non-African Amer.): 42 — ABNORMAL LOW
GFR CALC AF AMER: 51 — AB
Glucose: 129 mg/dL — ABNORMAL HIGH (ref 65–99)
OSMOLALITY: 290 (ref 275–301)
Potassium: 4.2 mmol/L (ref 3.5–5.1)
SGOT(AST): 18 U/L (ref 15–37)
SGPT (ALT): 34 U/L
SODIUM: 141 mmol/L (ref 136–145)
Total Protein: 6.5 g/dL (ref 6.4–8.2)

## 2013-12-10 LAB — CBC CANCER CENTER
BASOS ABS: 0.1 x10 3/mm (ref 0.0–0.1)
Basophil %: 0.9 %
Eosinophil #: 0 x10 3/mm (ref 0.0–0.7)
Eosinophil %: 0.8 %
HCT: 33.9 % — ABNORMAL LOW (ref 35.0–47.0)
HGB: 11.6 g/dL — ABNORMAL LOW (ref 12.0–16.0)
Lymphocyte #: 0.5 x10 3/mm — ABNORMAL LOW (ref 1.0–3.6)
Lymphocyte %: 7.8 %
MCH: 32.4 pg (ref 26.0–34.0)
MCHC: 34.1 g/dL (ref 32.0–36.0)
MCV: 95 fL (ref 80–100)
Monocyte #: 0.6 x10 3/mm (ref 0.2–0.9)
Monocyte %: 9.7 %
NEUTROS ABS: 4.7 x10 3/mm (ref 1.4–6.5)
NEUTROS PCT: 80.8 %
Platelet: 297 x10 3/mm (ref 150–440)
RBC: 3.57 10*6/uL — ABNORMAL LOW (ref 3.80–5.20)
RDW: 13.6 % (ref 11.5–14.5)
WBC: 5.8 x10 3/mm (ref 3.6–11.0)

## 2013-12-11 LAB — URINE IEP, RANDOM

## 2013-12-13 LAB — PROT IMMUNOELECTROPHORES(ARMC)

## 2013-12-17 LAB — HCG, QUANTITATIVE, PREGNANCY: BETA HCG, QUANT.: 2 m[IU]/mL

## 2013-12-31 LAB — BASIC METABOLIC PANEL
Anion Gap: 10 (ref 7–16)
BUN: 25 mg/dL — AB (ref 7–18)
CHLORIDE: 104 mmol/L (ref 98–107)
CO2: 25 mmol/L (ref 21–32)
CREATININE: 1.1 mg/dL (ref 0.60–1.30)
Calcium, Total: 8.7 mg/dL (ref 8.5–10.1)
EGFR (Non-African Amer.): 56 — ABNORMAL LOW
GLUCOSE: 125 mg/dL — AB (ref 65–99)
Osmolality: 283 (ref 275–301)
Potassium: 4.2 mmol/L (ref 3.5–5.1)
Sodium: 139 mmol/L (ref 136–145)

## 2013-12-31 LAB — CBC CANCER CENTER
BASOS PCT: 0.6 %
Basophil #: 0 x10 3/mm (ref 0.0–0.1)
Eosinophil #: 0 x10 3/mm (ref 0.0–0.7)
Eosinophil %: 0.6 %
HCT: 33.8 % — ABNORMAL LOW (ref 35.0–47.0)
HGB: 11.5 g/dL — ABNORMAL LOW (ref 12.0–16.0)
Lymphocyte #: 0.7 x10 3/mm — ABNORMAL LOW (ref 1.0–3.6)
Lymphocyte %: 12.9 %
MCH: 31.7 pg (ref 26.0–34.0)
MCHC: 34 g/dL (ref 32.0–36.0)
MCV: 93 fL (ref 80–100)
MONO ABS: 0.5 x10 3/mm (ref 0.2–0.9)
MONOS PCT: 10.4 %
Neutrophil #: 3.8 x10 3/mm (ref 1.4–6.5)
Neutrophil %: 75.5 %
Platelet: 198 x10 3/mm (ref 150–440)
RBC: 3.62 10*6/uL — ABNORMAL LOW (ref 3.80–5.20)
RDW: 13.3 % (ref 11.5–14.5)
WBC: 5.1 x10 3/mm (ref 3.6–11.0)

## 2014-01-01 LAB — PROT IMMUNOELECTROPHORES(ARMC)

## 2014-01-02 LAB — URINE IEP, RANDOM

## 2014-01-06 ENCOUNTER — Ambulatory Visit: Payer: Self-pay | Admitting: Oncology

## 2014-01-07 LAB — CBC CANCER CENTER
BASOS ABS: 0 x10 3/mm (ref 0.0–0.1)
BASOS PCT: 0.4 %
EOS ABS: 0 x10 3/mm (ref 0.0–0.7)
Eosinophil %: 0.5 %
HCT: 32.9 % — ABNORMAL LOW (ref 35.0–47.0)
HGB: 11.3 g/dL — ABNORMAL LOW (ref 12.0–16.0)
Lymphocyte #: 0.6 x10 3/mm — ABNORMAL LOW (ref 1.0–3.6)
Lymphocyte %: 12 %
MCH: 32.1 pg (ref 26.0–34.0)
MCHC: 34.4 g/dL (ref 32.0–36.0)
MCV: 93 fL (ref 80–100)
MONO ABS: 0.4 x10 3/mm (ref 0.2–0.9)
MONOS PCT: 8.7 %
NEUTROS PCT: 78.4 %
Neutrophil #: 3.6 x10 3/mm (ref 1.4–6.5)
Platelet: 189 x10 3/mm (ref 150–440)
RBC: 3.52 10*6/uL — ABNORMAL LOW (ref 3.80–5.20)
RDW: 13.2 % (ref 11.5–14.5)
WBC: 4.6 x10 3/mm (ref 3.6–11.0)

## 2014-01-07 LAB — BASIC METABOLIC PANEL
Anion Gap: 7 (ref 7–16)
BUN: 22 mg/dL — ABNORMAL HIGH (ref 7–18)
CALCIUM: 8.9 mg/dL (ref 8.5–10.1)
CHLORIDE: 105 mmol/L (ref 98–107)
CO2: 28 mmol/L (ref 21–32)
Creatinine: 1.08 mg/dL (ref 0.60–1.30)
EGFR (African American): >60
GFR CALC NON AF AMER: 57 — AB
Glucose: 175 mg/dL — ABNORMAL HIGH (ref 65–99)
OSMOLALITY: 287 (ref 275–301)
Potassium: 3.8 mmol/L (ref 3.5–5.1)
Sodium: 140 mmol/L (ref 136–145)

## 2014-01-08 LAB — URINE IEP, RANDOM

## 2014-01-09 LAB — PROT IMMUNOELECTROPHORES(ARMC)

## 2014-01-21 LAB — CBC CANCER CENTER
Basophil #: 0 x10 3/mm (ref 0.0–0.1)
Basophil %: 0.3 %
Eosinophil #: 0 x10 3/mm (ref 0.0–0.7)
Eosinophil %: 0.6 %
HCT: 38.8 % (ref 35.0–47.0)
HGB: 13.2 g/dL (ref 12.0–16.0)
Lymphocyte #: 0.7 x10 3/mm — ABNORMAL LOW (ref 1.0–3.6)
Lymphocyte %: 11.2 %
MCH: 31.6 pg (ref 26.0–34.0)
MCHC: 33.9 g/dL (ref 32.0–36.0)
MCV: 93 fL (ref 80–100)
MONO ABS: 0.4 x10 3/mm (ref 0.2–0.9)
MONOS PCT: 7.7 %
NEUTROS ABS: 4.7 x10 3/mm (ref 1.4–6.5)
Neutrophil %: 80.2 %
PLATELETS: 243 x10 3/mm (ref 150–440)
RBC: 4.17 10*6/uL (ref 3.80–5.20)
RDW: 13.6 % (ref 11.5–14.5)
WBC: 5.8 x10 3/mm (ref 3.6–11.0)

## 2014-01-21 LAB — COMPREHENSIVE METABOLIC PANEL
ALBUMIN: 3.7 g/dL (ref 3.4–5.0)
Alkaline Phosphatase: 95 U/L
Anion Gap: 10 (ref 7–16)
BILIRUBIN TOTAL: 0.4 mg/dL (ref 0.2–1.0)
BUN: 21 mg/dL — ABNORMAL HIGH (ref 7–18)
CHLORIDE: 104 mmol/L (ref 98–107)
Calcium, Total: 9.5 mg/dL (ref 8.5–10.1)
Co2: 27 mmol/L (ref 21–32)
Creatinine: 1.11 mg/dL (ref 0.60–1.30)
GFR CALC NON AF AMER: 56 — AB
Glucose: 145 mg/dL — ABNORMAL HIGH (ref 65–99)
OSMOLALITY: 287 (ref 275–301)
Potassium: 4.2 mmol/L (ref 3.5–5.1)
SGOT(AST): 23 U/L (ref 15–37)
SGPT (ALT): 47 U/L
Sodium: 141 mmol/L (ref 136–145)
Total Protein: 7.3 g/dL (ref 6.4–8.2)

## 2014-02-04 LAB — CBC CANCER CENTER
BASOS ABS: 0 x10 3/mm (ref 0.0–0.1)
Basophil %: 0.5 %
Eosinophil #: 0.1 x10 3/mm (ref 0.0–0.7)
Eosinophil %: 1.4 %
HCT: 33.6 % — ABNORMAL LOW (ref 35.0–47.0)
HGB: 11.7 g/dL — ABNORMAL LOW (ref 12.0–16.0)
LYMPHS ABS: 0.6 x10 3/mm — AB (ref 1.0–3.6)
Lymphocyte %: 12.4 %
MCH: 31.9 pg (ref 26.0–34.0)
MCHC: 34.8 g/dL (ref 32.0–36.0)
MCV: 92 fL (ref 80–100)
MONOS PCT: 11.7 %
Monocyte #: 0.6 x10 3/mm (ref 0.2–0.9)
NEUTROS ABS: 3.8 x10 3/mm (ref 1.4–6.5)
NEUTROS PCT: 74 %
Platelet: 202 x10 3/mm (ref 150–440)
RBC: 3.66 10*6/uL — AB (ref 3.80–5.20)
RDW: 13.8 % (ref 11.5–14.5)
WBC: 5.1 x10 3/mm (ref 3.6–11.0)

## 2014-02-04 LAB — BASIC METABOLIC PANEL
Anion Gap: 9 (ref 7–16)
BUN: 24 mg/dL — ABNORMAL HIGH (ref 7–18)
CREATININE: 1.08 mg/dL (ref 0.60–1.30)
Calcium, Total: 9.2 mg/dL (ref 8.5–10.1)
Chloride: 105 mmol/L (ref 98–107)
Co2: 26 mmol/L (ref 21–32)
EGFR (African American): >60
EGFR (Non-African Amer.): 57 — ABNORMAL LOW
Glucose: 136 mg/dL — ABNORMAL HIGH (ref 65–99)
Osmolality: 286 (ref 275–301)
Potassium: 3.8 mmol/L (ref 3.5–5.1)
SODIUM: 140 mmol/L (ref 136–145)

## 2014-02-05 ENCOUNTER — Ambulatory Visit: Payer: Self-pay | Admitting: Oncology

## 2014-02-05 LAB — URINE IEP, RANDOM

## 2014-02-05 LAB — PROT IMMUNOELECTROPHORES(ARMC)

## 2014-02-18 LAB — COMPREHENSIVE METABOLIC PANEL
ALBUMIN: 3.7 g/dL (ref 3.4–5.0)
Alkaline Phosphatase: 75 U/L
Anion Gap: 8 (ref 7–16)
BUN: 25 mg/dL — ABNORMAL HIGH (ref 7–18)
Bilirubin,Total: 0.3 mg/dL (ref 0.2–1.0)
CALCIUM: 9.2 mg/dL (ref 8.5–10.1)
CO2: 27 mmol/L (ref 21–32)
Chloride: 106 mmol/L (ref 98–107)
Creatinine: 1.04 mg/dL (ref 0.60–1.30)
EGFR (Non-African Amer.): 60 — ABNORMAL LOW
Glucose: 125 mg/dL — ABNORMAL HIGH (ref 65–99)
OSMOLALITY: 287 (ref 275–301)
POTASSIUM: 3.9 mmol/L (ref 3.5–5.1)
SGOT(AST): 24 U/L (ref 15–37)
SGPT (ALT): 44 U/L
Sodium: 141 mmol/L (ref 136–145)
Total Protein: 6.9 g/dL (ref 6.4–8.2)

## 2014-02-18 LAB — CBC CANCER CENTER
BASOS ABS: 0 x10 3/mm (ref 0.0–0.1)
Basophil %: 0.3 %
EOS ABS: 0.1 x10 3/mm (ref 0.0–0.7)
EOS PCT: 1.1 %
HCT: 36.4 % (ref 35.0–47.0)
HGB: 12.1 g/dL (ref 12.0–16.0)
LYMPHS ABS: 0.7 x10 3/mm — AB (ref 1.0–3.6)
LYMPHS PCT: 11.5 %
MCH: 31.4 pg (ref 26.0–34.0)
MCHC: 33.3 g/dL (ref 32.0–36.0)
MCV: 94 fL (ref 80–100)
MONOS PCT: 8.9 %
Monocyte #: 0.5 x10 3/mm (ref 0.2–0.9)
NEUTROS ABS: 4.8 x10 3/mm (ref 1.4–6.5)
Neutrophil %: 78.2 %
Platelet: 203 x10 3/mm (ref 150–440)
RBC: 3.87 10*6/uL (ref 3.80–5.20)
RDW: 13.9 % (ref 11.5–14.5)
WBC: 6.1 x10 3/mm (ref 3.6–11.0)

## 2014-03-04 LAB — CBC CANCER CENTER
BASOS ABS: 0 x10 3/mm (ref 0.0–0.1)
BASOS PCT: 0.4 %
EOS ABS: 0.1 x10 3/mm (ref 0.0–0.7)
Eosinophil %: 1.3 %
HCT: 30.4 % — AB (ref 35.0–47.0)
HGB: 10.2 g/dL — AB (ref 12.0–16.0)
Lymphocyte #: 0.5 x10 3/mm — ABNORMAL LOW (ref 1.0–3.6)
Lymphocyte %: 8.6 %
MCH: 30.4 pg (ref 26.0–34.0)
MCHC: 33.6 g/dL (ref 32.0–36.0)
MCV: 91 fL (ref 80–100)
MONO ABS: 0.5 x10 3/mm (ref 0.2–0.9)
MONOS PCT: 7.5 %
NEUTROS ABS: 5 x10 3/mm (ref 1.4–6.5)
NEUTROS PCT: 82.2 %
Platelet: 268 x10 3/mm (ref 150–440)
RBC: 3.36 10*6/uL — ABNORMAL LOW (ref 3.80–5.20)
RDW: 13.2 % (ref 11.5–14.5)
WBC: 6 x10 3/mm (ref 3.6–11.0)

## 2014-03-04 LAB — BASIC METABOLIC PANEL
Anion Gap: 13 (ref 7–16)
BUN: 21 mg/dL — AB (ref 7–18)
CALCIUM: 8.2 mg/dL — AB (ref 8.5–10.1)
CHLORIDE: 104 mmol/L (ref 98–107)
CREATININE: 1.08 mg/dL (ref 0.60–1.30)
Co2: 25 mmol/L (ref 21–32)
EGFR (African American): >60
GFR CALC NON AF AMER: 57 — AB
Glucose: 198 mg/dL — ABNORMAL HIGH (ref 65–99)
Osmolality: 292 (ref 275–301)
POTASSIUM: 3.5 mmol/L (ref 3.5–5.1)
SODIUM: 142 mmol/L (ref 136–145)

## 2014-03-05 LAB — URINE IEP, RANDOM

## 2014-03-05 LAB — PROT IMMUNOELECTROPHORES(ARMC)

## 2014-03-08 ENCOUNTER — Ambulatory Visit: Payer: Self-pay | Admitting: Oncology

## 2014-03-18 LAB — CBC CANCER CENTER
Basophil #: 0 x10 3/mm (ref 0.0–0.1)
Basophil %: 0.5 %
Eosinophil #: 0 x10 3/mm (ref 0.0–0.7)
Eosinophil %: 0.4 %
HCT: 34.1 % — AB (ref 35.0–47.0)
HGB: 11.7 g/dL — ABNORMAL LOW (ref 12.0–16.0)
Lymphocyte #: 0.6 x10 3/mm — ABNORMAL LOW (ref 1.0–3.6)
Lymphocyte %: 8.6 %
MCH: 31 pg (ref 26.0–34.0)
MCHC: 34.4 g/dL (ref 32.0–36.0)
MCV: 90 fL (ref 80–100)
MONOS PCT: 7.3 %
Monocyte #: 0.5 x10 3/mm (ref 0.2–0.9)
NEUTROS ABS: 5.9 x10 3/mm (ref 1.4–6.5)
Neutrophil %: 83.2 %
Platelet: 292 x10 3/mm (ref 150–440)
RBC: 3.78 10*6/uL — AB (ref 3.80–5.20)
RDW: 13.1 % (ref 11.5–14.5)
WBC: 7.1 x10 3/mm (ref 3.6–11.0)

## 2014-03-18 LAB — COMPREHENSIVE METABOLIC PANEL
ALBUMIN: 3.2 g/dL — AB (ref 3.4–5.0)
AST: 18 U/L (ref 15–37)
Alkaline Phosphatase: 104 U/L
Anion Gap: 8 (ref 7–16)
BUN: 21 mg/dL — ABNORMAL HIGH (ref 7–18)
Bilirubin,Total: 0.3 mg/dL (ref 0.2–1.0)
Calcium, Total: 8.8 mg/dL (ref 8.5–10.1)
Chloride: 104 mmol/L (ref 98–107)
Co2: 28 mmol/L (ref 21–32)
Creatinine: 0.94 mg/dL (ref 0.60–1.30)
EGFR (African American): >60
EGFR (Non-African Amer.): >60
GLUCOSE: 200 mg/dL — AB (ref 65–99)
Osmolality: 288 (ref 275–301)
POTASSIUM: 3.7 mmol/L (ref 3.5–5.1)
SGPT (ALT): 31 U/L
SODIUM: 140 mmol/L (ref 136–145)
TOTAL PROTEIN: 7.2 g/dL (ref 6.4–8.2)

## 2014-04-01 LAB — BASIC METABOLIC PANEL
ANION GAP: 12 (ref 7–16)
BUN: 27 mg/dL — AB (ref 7–18)
Calcium, Total: 8.8 mg/dL (ref 8.5–10.1)
Chloride: 103 mmol/L (ref 98–107)
Co2: 24 mmol/L (ref 21–32)
Creatinine: 1.15 mg/dL (ref 0.60–1.30)
GFR CALC NON AF AMER: 53 — AB
GLUCOSE: 236 mg/dL — AB (ref 65–99)
Osmolality: 290 (ref 275–301)
Potassium: 3.9 mmol/L (ref 3.5–5.1)
Sodium: 139 mmol/L (ref 136–145)

## 2014-04-01 LAB — CBC CANCER CENTER
Basophil #: 0 x10 3/mm (ref 0.0–0.1)
Basophil %: 0.7 %
EOS PCT: 0.7 %
Eosinophil #: 0 x10 3/mm (ref 0.0–0.7)
HCT: 34.6 % — ABNORMAL LOW (ref 35.0–47.0)
HGB: 11.7 g/dL — ABNORMAL LOW (ref 12.0–16.0)
LYMPHS PCT: 11.4 %
Lymphocyte #: 0.7 x10 3/mm — ABNORMAL LOW (ref 1.0–3.6)
MCH: 30.9 pg (ref 26.0–34.0)
MCHC: 33.9 g/dL (ref 32.0–36.0)
MCV: 91 fL (ref 80–100)
MONOS PCT: 8.2 %
Monocyte #: 0.5 x10 3/mm (ref 0.2–0.9)
NEUTROS PCT: 79 %
Neutrophil #: 4.6 x10 3/mm (ref 1.4–6.5)
PLATELETS: 235 x10 3/mm (ref 150–440)
RBC: 3.8 10*6/uL (ref 3.80–5.20)
RDW: 13.2 % (ref 11.5–14.5)
WBC: 5.8 x10 3/mm (ref 3.6–11.0)

## 2014-04-02 LAB — PROT IMMUNOELECTROPHORES(ARMC)

## 2014-04-03 LAB — URINE IEP, RANDOM

## 2014-04-08 ENCOUNTER — Ambulatory Visit: Payer: Self-pay | Admitting: Oncology

## 2014-04-15 LAB — COMPREHENSIVE METABOLIC PANEL
ALBUMIN: 3.3 g/dL — AB (ref 3.4–5.0)
AST: 22 U/L (ref 15–37)
Alkaline Phosphatase: 103 U/L (ref 46–116)
Anion Gap: 8 (ref 7–16)
BUN: 23 mg/dL — AB (ref 7–18)
Bilirubin,Total: 0.2 mg/dL (ref 0.2–1.0)
CALCIUM: 8.6 mg/dL (ref 8.5–10.1)
CREATININE: 0.99 mg/dL (ref 0.60–1.30)
Chloride: 106 mmol/L (ref 98–107)
Co2: 27 mmol/L (ref 21–32)
EGFR (African American): >60
EGFR (Non-African Amer.): >60
Glucose: 218 mg/dL — ABNORMAL HIGH (ref 65–99)
OSMOLALITY: 292 (ref 275–301)
Potassium: 4.2 mmol/L (ref 3.5–5.1)
SGPT (ALT): 45 U/L (ref 14–63)
SODIUM: 141 mmol/L (ref 136–145)
Total Protein: 6.9 g/dL (ref 6.4–8.2)

## 2014-04-15 LAB — CBC CANCER CENTER
BASOS PCT: 0.4 %
Basophil #: 0 x10 3/mm (ref 0.0–0.1)
Eosinophil #: 0.1 x10 3/mm (ref 0.0–0.7)
Eosinophil %: 1 %
HCT: 36.7 % (ref 35.0–47.0)
HGB: 12.7 g/dL (ref 12.0–16.0)
LYMPHS ABS: 0.7 x10 3/mm — AB (ref 1.0–3.6)
Lymphocyte %: 10.3 %
MCH: 31.2 pg (ref 26.0–34.0)
MCHC: 34.5 g/dL (ref 32.0–36.0)
MCV: 91 fL (ref 80–100)
Monocyte #: 0.4 x10 3/mm (ref 0.2–0.9)
Monocyte %: 6.8 %
NEUTROS ABS: 5.2 x10 3/mm (ref 1.4–6.5)
NEUTROS PCT: 81.5 %
PLATELETS: 226 x10 3/mm (ref 150–440)
RBC: 4.06 10*6/uL (ref 3.80–5.20)
RDW: 13.1 % (ref 11.5–14.5)
WBC: 6.4 x10 3/mm (ref 3.6–11.0)

## 2014-05-06 ENCOUNTER — Ambulatory Visit: Payer: Self-pay

## 2014-05-07 ENCOUNTER — Ambulatory Visit
Admit: 2014-05-07 | Disposition: A | Discharge status: Home / Self Care | Payer: Self-pay | Attending: Oncology | Admitting: Oncology

## 2014-05-28 DIAGNOSIS — M5126 Other intervertebral disc displacement, lumbar region: Secondary | ICD-10-CM | POA: Insufficient documentation

## 2014-05-28 DIAGNOSIS — M545 Low back pain, unspecified: Secondary | ICD-10-CM | POA: Insufficient documentation

## 2014-05-28 DIAGNOSIS — M5136 Other intervertebral disc degeneration, lumbar region: Secondary | ICD-10-CM | POA: Insufficient documentation

## 2014-06-03 LAB — URINE IEP, RANDOM

## 2014-06-07 ENCOUNTER — Ambulatory Visit
Admit: 2014-06-07 | Disposition: A | Discharge status: Home / Self Care | Payer: Self-pay | Attending: Oncology | Admitting: Oncology

## 2014-06-27 LAB — CBC CANCER CENTER
Basophil #: 0.1 x10 3/mm (ref 0.0–0.1)
Basophil %: 0.8 %
EOS PCT: 2 %
Eosinophil #: 0.2 x10 3/mm (ref 0.0–0.7)
HCT: 34 % — AB (ref 35.0–47.0)
HGB: 11.7 g/dL — ABNORMAL LOW (ref 12.0–16.0)
LYMPHS ABS: 0.9 x10 3/mm — AB (ref 1.0–3.6)
Lymphocyte %: 11.4 %
MCH: 30.2 pg (ref 26.0–34.0)
MCHC: 34.3 g/dL (ref 32.0–36.0)
MCV: 88 fL (ref 80–100)
MONOS PCT: 10.7 %
Monocyte #: 0.9 x10 3/mm (ref 0.2–0.9)
NEUTROS ABS: 6.1 x10 3/mm (ref 1.4–6.5)
Neutrophil %: 75.1 %
Platelet: 267 x10 3/mm (ref 150–440)
RBC: 3.87 10*6/uL (ref 3.80–5.20)
RDW: 13.4 % (ref 11.5–14.5)
WBC: 8.2 x10 3/mm (ref 3.6–11.0)

## 2014-06-27 LAB — BASIC METABOLIC PANEL
Anion Gap: 5 — ABNORMAL LOW (ref 7–16)
BUN: 27 mg/dL — ABNORMAL HIGH
CALCIUM: 9.1 mg/dL
Chloride: 106 mmol/L
Co2: 26 mmol/L
Creatinine: 1.36 mg/dL — ABNORMAL HIGH
EGFR (African American): 53 — ABNORMAL LOW
EGFR (Non-African Amer.): 46 — ABNORMAL LOW
Glucose: 83 mg/dL
Potassium: 4 mmol/L
Sodium: 137 mmol/L

## 2014-06-28 LAB — URINE IEP, RANDOM

## 2014-06-29 NOTE — Consult Note (Signed)
History of Present Illness:  Reason for Consult Multiple lytic bone lesions highly suspicious for underlying malignancy.   HPI   Patient is a 49 year old female who presented to the emergency room with significant nausea, vomiting, abdominal pain, and back pain. Further workup revealed acute renal failure, hypercalcemia, and multiple bony lytic lesions highly suspicious for underlying multiple myeloma. Currently, she feels improved from admission but not back to baseline. She has no neurologic complaints. Her pain is improved. She denies any chest pain or shortness of breath. She denies any further nausea or vomiting. She has no melena or hematochezia. She denies any urinary complaints. Patient offers no further specific complaints.  PFSH:  Additional Past Medical and Surgical History Osteoarthritis, gestational diabetes, hypertension, C-section x2.  Family history: CAD, skin cancer.  Social history: Patient denies tobacco or alcohol.   Review of Systems:  Performance Status (ECOG) 0   NURSING NOTES: **Vital Signs.:   02-Jun-15 14:40   Vital Signs Type: Routine   Temperature Temperature (F): 98.7   Celsius: 37   Temperature Source: oral   Pulse Pulse: 100   Respirations Respirations: 20   Systolic BP Systolic BP: 967   Diastolic BP (mmHg) Diastolic BP (mmHg): 72   Mean BP: 98   Pulse Ox % Pulse Ox %: 96   Pulse Ox Activity Level: At rest   Oxygen Delivery: Room Air/ 21 %   Physical Exam:  Physical Exam General: Well-developed, well-nourished, no acute distress. Eyes: Pink conjunctiva, anicteric sclera. HEENT: Normocephalic, moist mucous membranes, clear oropharnyx. Lungs: Clear to auscultation bilaterally. Heart: Regular rate and rhythm. No rubs, murmurs, or gallops. Abdomen: Soft, nontender, nondistended. No organomegaly noted, normoactive bowel sounds. Musculoskeletal: No edema, cyanosis, or clubbing. Neuro: Alert, answering all questions appropriately.  Cranial nerves grossly intact. Skin: No rashes or petechiae noted. Psych: Normal affect. Lymphatics: No cervical, calvicular, axillary or inguinal LAD.    No Known Allergies:     clarithromycin 500 mg oral tablet: 1 tab(s) orally every 12 hours, Status: Active, Quantity: 0, Refills: None   amoxicillin 500 mg oral capsule: 2 cap(s) orally 2 times a day, Status: Active, Quantity: 0, Refills: None   omeprazole 20 mg oral delayed release capsule: 1 cap(s) orally once a day, Status: Active, Quantity: 0, Refills: None   hydrochlorothiazide 12.5 mg oral capsule: 1 cap(s) orally once a day, Status: Active, Quantity: 0, Refills: None  Laboratory Results: Routine Chem:  02-Jun-15 10:57   Glucose, Serum  191  BUN  47  Creatinine (comp)  4.21  Sodium, Serum 136  Potassium, Serum  3.3  Chloride, Serum 102  CO2, Serum 27  Calcium (Total), Serum  11.5  Anion Gap 7  Osmolality (calc) 289  eGFR (African American)  14  eGFR (Non-African American)  12 (eGFR values <39m/min/1.73 m2 may be an indication of chronic kidney disease (CKD). Calculated eGFR is useful in patients with stable renal function. The eGFR calculation will not be reliable in acutely ill patients when serum creatinine is changing rapidly. It is not useful in  patients on dialysis. The eGFR calculation may not be applicable to patients at the low and high extremes of body sizes, pregnant women, and vegetarians.)   Assessment and Plan: Impression:   Multiple bony lytic lesions highly suspicious for underlying multiple myeloma. Plan:   1. Lytic lesions: CT scan results as above with no obvious mass to suggest metastatic disease. SIEP and UIEP are pending at time of dictation. Patient will likely require bone marrow biopsy  in the near future, but if she requires kyphoplasty for her pathologic fractures of her thoracic spine and this could suffice. Will comment further once full laboratory workup is complete.Hypercalcemia: Likely  secondary to underlying disease process, improving. Continue aggressive IV fluid hydration.Anemia: Likely secondary to underlying disease process, monitor. Patient does not require a blood transfusion at this time. consult, will follow.  Electronic Signatures: Delight Hoh (MD)  (Signed 02-Jun-15 17:20)  Authored: HISTORY OF PRESENT ILLNESS, PFSH, ROS, NURSING NOTES, PE, ALLERGIES, HOME MEDICATIONS, LABS, ASSESSMENT AND PLAN   Last Updated: 02-Jun-15 17:20 by Delight Hoh (MD)

## 2014-06-29 NOTE — Op Note (Signed)
PATIENT NAME:  Jamie Keller, Jamie Keller MR#:  [id number] DATE OF BIRTH:  01/02/1966  DATE OF PROCEDURE:  09/06/2013  PREOPERATIVE DIAGNOSIS:  Multiple myeloma with poor venous access.    POSTOPERATIVE DIAGNOSIS:  Multiple myeloma with poor venous access.   PROCEDURES:  1.  Ultrasound guidance for vascular access, right internal jugular vein.  2.  Fluoroscopic guidance for placement of catheter.  3.  Placement of CT compatible Port-A-Cath, right internal jugular vein.   SURGEON:  Algernon Huxley, MD.  ANESTHESIA:  Local with moderate conscious sedation.   FLUOROSCOPY TIME:  Less than 1 minute.   CONTRAST:  Zero.   ESTIMATED BLOOD LOSS: Minimal.  INDICATION FOR PROCEDURE: A 49 year old Hispanic female with multiple myeloma. She is getting radiation and chemotherapy. She needs a Port-a-Cath for durable venous access. And we are asked to place this. Risks and benefits were discussed. Informed consent was obtained.   DESCRIPTION OF THE PROCEDURE:  The patient was brought to the vascular and interventional radiology suite. The right neck and chest were sterilely prepped and draped, and a sterile surgical field was created. Ultrasound was used to help visualize a patent right internal jugular vein. This was then accessed under direct ultrasound guidance without difficulty with a Seldinger needle and a permanent image was recorded. A J-wire was placed. After skin nick and dilatation, the peel-away sheath was then placed over the wire. I then anesthetized an area under the clavicle approximately 2 fingerbreadths. A transverse incision was created and an inferior pocket was created with electrocautery and blunt dissection. The port was then brought onto the field, placed into the pocket and secured to the chest wall with 2 Prolene sutures. The catheter was connected to the port and tunneled from the subclavicular incision to the access site. Fluoroscopic guidance was used to cut the catheter to an  appropriate length. The catheter was then placed through the peel-away sheath and the peel-away sheath was removed. The catheter tip was parked in excellent location in the initial portion of the right atrium just past the cavoatrial junction.  The pocket was then irrigated with antibiotic-impregnated saline and the wound was closed with a running 3-0 Vicryl and a 4-0 Monocryl. The access incision was closed with a single 4-0 Monocryl. The Huber needle was used to withdraw blood and flush the port with heparinized saline. Dermabond was then placed as a dressing. The patient tolerated the procedure well and was taken to the recovery room in stable condition.     ______________________________ Algernon Huxley, MD jsd:lt D: 09/06/2013 09:39:44 ET T: 09/06/2013 13:39:15 ET JOB#: [id number]  cc: Algernon Huxley, MD, <Dictator> Algernon Huxley MD ELECTRONICALLY SIGNED 09/10/2013 15:46

## 2014-06-29 NOTE — Consult Note (Signed)
Reason for Visit: This 49 year old Female patient presents to the clinic for initial evaluation of  multiple myeloma .   Referred by Dr. Oliva Bustard.  Diagnosis:  Chief Complaint/Diagnosis   49 year old female with significant bone involvement of multiple myeloma with lower back pain MRI evidence of impingement on the spinal canal for palliative radiation therapy to spine.  Pathology Report pathology report reviewed   Imaging Report CT scans reviewed   Referral Report clinical notes reviewed   Planned Treatment Regimen palliative radiation therapy to lower thoracic lumbar spine and SI joints   HPI   patient is a 63 year oldSpanish speaking female with known multiple myeloma who initially presented to the emergency room with nausea back pain and found to be in acute renal failure with halcemia. She was noted to have multiple bony lytic lesions highly suspicious for underlying myeloma. 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 CT scan showed wide spread eczema involvement of multiple lytic lesions. MRI scan of her thoracic lumbar spine showed diffuse marrow involvement with mass effect on the thecal sac at T10-T11 and L1. Shows a mild compression fracture of T11. No cord compression was noted.patient seen today for consideration of palliative radiation therapy. CT scans demonstrate widespread lytic destruction. [name] having no problems ambulating. She has no focal neurologic deficits at this time. [name] been started on Cytoxan Velcade and prednisone as of 08/15/2013.  Past Hx:    HTN:    c section:   Past, Family and Social History:  Past Medical History positive   Cardiovascular hypertension   Past Surgical History C-section   Family History noncontributory   Social History noncontributory   Additional Past Medical and Surgical History accompanied by her daughter and translator today   Allergies:   No Known Allergies:   Home Meds:  Home Medications: Medication  Instructions Status  promethazine 25 mg oral tablet 1 tab(s) orally every 6 hours as needed for nausea and vomiting  Active  verapamil 80 mg oral tablet 0.5 tab(s) orally 2 times a day Active  glipiZIDE 5 mg oral tablet 0.5 tab(s) orally once a day Active  acetaminophen-HYDROcodone 325 mg-5 mg oral tablet 1 tab(s) orally every 4 hours, As Needed, pain , As needed, pain Active  ALPRAZolam 0.25 mg oral tablet 1 tab(s) orally every 8 hours, As Needed, anxiety , As needed, anxiety Active  omeprazole 20 mg oral delayed release capsule 1 cap(s) orally once a day Active  glipiZIDE 5 mg oral tablet 1 tab(s) orally once a day Active   Review of Systems:  General negative   Performance Status (ECOG) 0   Skin negative   Breast negative   Ophthalmologic negative   ENMT negative   Respiratory and Thorax negative   Cardiovascular negative   Gastrointestinal negative   Genitourinary negative   Musculoskeletal negative   Neurological negative   Psychiatric negative   Hematology/Lymphatics negative   Endocrine negative   Allergic/Immunologic negative   Review of Systems   review of systems obtained from nurses notes  Physical Exam:  General/Skin/HEENT:  General normal   Skin normal   Eyes normal   ENMT normal   Head and Neck normal   Additional PE well-developed well-nourished female in NAD. Motor sensory and DTR levels are equal and symmetric in upper and lower extremities. Deep palpation of her thoracic lumbar spine does not elicit pain. Lungs are clear to A&P cardiac examination shows regular rate and rhythm.   Breasts/Resp/CV/GI/GU:  Respiratory and Thorax normal  Cardiovascular normal   Gastrointestinal normal   Genitourinary normal   MS/Neuro/Psych/Lymph:  Musculoskeletal normal   Neurological normal   Lymphatics normal   Other Results:  Radiology Results: LabUnknown:    02-Jun-15 03:53, CT Abdomen and Pelvis Without Contrast  PACS Image      02-Jun-15 13:46, CT Chest Without Contrast  PACS Image     02-Jun-15 14:34, MRI Thoracic Spine Without Contrast  PACS Image   MRI:  MRI Thoracic Spine Without Contrast   REASON FOR EXAM:    fx, pain, concern about metastatic malignancy vs   multiple myeloma  COMMENTS:       PROCEDURE: MR  - MR THORACIC SPINE WO  - Aug 07 2013  2:34PM     CLINICAL DATA:  Thoracic spine fracture. Lytic lesions and bone on  prior examinations.    EXAM:  MRI THORACIC SPINE WITHOUT CONTRAST    TECHNIQUE:  Multiplanar, multisequence MR imaging of the thoracic spine was  performed. No intravenous contrast was administered.  COMPARISON:  CT scans of the chest and abdomen and pelvis earlier  this same day.    FINDINGS:  Marrow signal is abnormal in all visualized bones with decreased T1  signal identified. Scout imaging includes a wide field-of-view  sagittal T1 weighted sequence of the cervical spine which also  demonstrates diffusely abnormal signal. As seen on CT of the chest,  the patient has a T11 compression fracture with vertebral body  height loss of approximately 30%. Destruction of the posterior  cortex of T11 and epidural tumor cause mild mass effect on the  thecal sack. No bony retropulsion is identified. Cortical  destruction posteriorly on the left in the T10 vertebral body  extends into the pedicles with tumor causing some mass effect on the  thecal sac. Similar change is seen posteriorly in the L1 vertebral  body where there is extensive cortical destructive change and  epidural tumor creates mass effect on the thecal sac. The thoracic  cord demonstrates normal signal throughout. There is no mass effect  on the cord. Visualized paraspinous structures are unremarkable.     IMPRESSION:  Diffuse marrow signal abnormality is consistent with neoplastic  process, either metastatic disease or multiple myeloma. Areas of  cortical destruction with epidural tumor causing mass effect on  the  thecal sac are identified at T10, T11 and L1. Mild compression  fracture of T11 is seen. There is no cord compression or cord signal  abnormality.      Electronically Signed    By: Inge Rise M.D.    On: 08/07/2013 14:53         Verified By: Ramond Dial, M.D.,  CT:    02-Jun-15 03:53, CT Abdomen and Pelvis Without Contrast  CT Abdomen and Pelvis Without Contrast   REASON FOR EXAM:    (1) PO CONTRAST ONLY; (2) N/V/ DIFFUSE ABD PAIN X 3   WEEKS;    NOTE: Nursing to G  COMMENTS:   May transport without cardiac monitor    PROCEDURE: CT  - CT ABDOMEN AND PELVIS W0  - Aug 07 2013  3:53AM     CLINICAL DATA:  Diffuse abdominal pain for 3 weeks    EXAM:  CT ABDOMEN AND PELVIS WITHOUT CONTRAST    TECHNIQUE:  Multidetector CT imaging of the abdomen and pelvis was performed  following the standard protocol without IV contrast.  COMPARISON:  None.    FINDINGS:  BODY WALL: Unremarkable.  LOWER CHEST: Calcified granuloma at the right base. No evidence of  malignant nodules in the lower lungs.    ABDOMEN/PELVIS:    Liver: Diffuse fatty infiltration.    Biliary: High-density appearance of the gallbladder without  calcified stone.  Pancreas: Unremarkable.    Spleen: Unremarkable.    Adrenals: Unremarkable.    Kidneys and ureters: No hydronephrosis or stone.    Bladder: Unremarkable.    Reproductive: Unremarkable.    Bowel: No obstruction. Negative appendix.    Retroperitoneum: No mass or adenopathy.  Peritoneum: No free fluid or gas.    Vascular: No acute abnormality.    OSSEOUS: There are innumerable lytic lesions throughout the imaged  skeleton, with particularly large deposits in the left sacral ala,  bilateral iliac wings and left femoral neck. There is remote  appearing right inferior pubic ramus fracture. There is a T11  compression fracture with approximately 25% height loss. Probable  extraosseous tumor spread into the spinal canal at the  L1 and T10  levels. Incidental fatty filum, 3 to 4 mm thickness.     IMPRESSION:  1. Diffuse skeletal malignancy which could be from metastatic  disease or multiple myeloma. There is pathologic fracturing of the  T11 vertebral body with mild height loss. Earlyspinal canal  extension at the levels of T10 and L1; MRI could further evaluate.  2. No evidence of primary malignancy in the abdomen or lower chest.      Electronically Signed    By: Jorje Guild M.D.    On: 08/07/2013 04:13         Verified By: Gilford Silvius, M.D.,    02-Jun-15 13:46, CT Chest Without Contrast  CT Chest Without Contrast   REASON FOR EXAM:    multiple bone lesion, assess for primary...  COMMENTS:       PROCEDURE: CT  - CT CHEST WITHOUT CONTRAST  - Aug 07 2013  1:46PM     CLINICAL DATA:  Multiple bone lesions. Evaluate for potential  primary neoplasm.    EXAM:  CT CHESTWITHOUT CONTRAST    TECHNIQUE:  Multidetector CT imaging of the chest was performed following the  standard protocol without IV contrast..  COMPARISON:  No priors.    FINDINGS:  Mediastinum: Heart size is normal. There is no significant  pericardial fluid, thickening or pericardial calcification. No  pathologically enlarged mediastinal or hilar lymph nodes. Please  note that accurate exclusion of hilar adenopathy is limited on  noncontrast CT scans. Esophagus is unremarkable in appearance.    Lungs/Pleura: Linear opacities in the lower lobes of the lungs  bilaterally, and the inferior segment of the lingula, most  compatible with areas of subsegmental atelectasis and/or scarring.  No confluent consolidative airspace disease. No pleural effusions.  There is a small calcified nodule in the right lower lobe compatible  with a calcified granuloma. No other suspicious appearing pulmonary  nodules or masses are noted to suggest a primary bronchogenic  neoplasm at this time.    Upper Abdomen: Severe low attenuation throughout  the hepatic  parenchyma, compatible with severe hepatic steatosis.    Musculoskeletal: Innumerable lytic lesions throughout all visualized  portions of the axial and appendicular skeleton. At T11 there is a  pathologic compression fracture with approximately 30% loss of  anterior vertebral body height. Healing pathologic fractures are  noted within the ribs bilaterally at anterior right fifth rib,  lateral right ninth rib, lateral left ninth rib, and lateral left  tenth rib.  IMPRESSION:  1. Innumerable lytic lesions throughout the visualized axial and  appendicular skeleton compatible with either widespread metastatic  disease to the bone or multiple myeloma.  2. No primary bronchogenic neoplasm identified.  3. Pathologic compression fracture at T11 with approximately 30%  loss of anterior vertebral body height, and numerous pathologic rib  fractures bilaterally, as above.  4. Severe hepatic steatosis.      Electronically Signed    By: Vinnie Langton M.D.    On: 08/07/2013 14:02       Verified By: Etheleen Mayhew, M.D.,   Relevent Results:   Relevant Scans and Labs MRI scans and CT scans were all reviewed with   Assessment and Plan: Impression:   widespread bony involvement from multiple myeloma in 49 year old female Plan:   at this time elect to start a course of toleration therapy incorporate her lower thoracic lumbar spine lesions as well as her SI joints which show marked myelomatous involvement. We'll treat to 3000 cGy in 15 fractions. I will use a lower dose per fraction based on the large size of involvement of her radiation fields and to spare undue GI toxicity. Risks and benefits of treatment including possible diarrhea, possible urinary frequency urgency, alteration of blood counts, fatigue, all were explained in detail to the patient. Iser for CT simulation early next week. We'll try to get 2 treatments in next week to start her palliative course of  treatment.  I would like to take this opportunity for allowing me to participate in the care of your patient..  CC Referral:  cc: Dr. Marney Setting   Electronic Signatures: Baruch Gouty, Roda Shutters (MD)  (Signed 19-Jun-15 09:00)  Authored: HPI, Diagnosis, Past Hx, PFSH, Allergies, Home Meds, ROS, Physical Exam, Other Results, Relevent Results, Encounter Assessment and Plan, CC Referring Physician   Last Updated: 19-Jun-15 09:00 by Armstead Peaks (MD)

## 2014-06-29 NOTE — H&P (Signed)
PATIENT NAME:  Jamie Keller, Jamie Keller MR#:  [id number] DATE OF BIRTH:  [birth year]  DATE OF ADMISSION:  08/07/2013  PRIMARY CARE PHYSICIAN: Denton Lank, MD  REFERRING PHYSICIAN: Lurline Hare, MD  CHIEF COMPLAINT: Nausea, vomiting, abdominal pain, and back pain.   HISTORY OF PRESENT ILLNESS: This is a very nice 49 year old female with history of recently diagnosed hypertension who comes in with main complaint of abdominal pain, nausea and vomiting. The patient had an episode of a fall that happened back in January. Since then she has been having significant musculoskeletal problems including hip pain and back pain which is pretty much continuous for what she is seeing different providers at urgent cares and also chiropractors. The patient continued to have pain, which has been evolving to be up to 10 out of 10, located in the lower back/hip without any significant radiation, dull pain, occasionally sharp, with no alleviating factors or worsening factors, until she came over here and got pain medication.   The patient also developed some bloating and distention of her abdomen for about a month, and she became really constipated. Multiple  medications for constipation have been given to the patient without any significant success. Also, the patient started having significant abdominal pain just right after that, about 3-1/2 weeks ago, and started having multiple episodes of vomiting. The vomiting happens in small amounts, occasionally could be brought up by cough or by meals, approximately 10 times a day. The patient has significant decrease of her appetite and the only thing that she is able to tolerate is liquids like yogurt. The patient has the pain located in the mesogastric area with intensity 6 out of 10 with radiation to epigastric. Alleviating factors are rest. Worsening factors are moving or eating. The patient was evaluated in the Emergency Department where she has acute kidney injury, hypercalcemia,  and multiple lytic lesions at the level of the back and pelvis. The patient is admitted to the hospital for evaluation of this constellation of symptoms, which is likely multiple myeloma.   TWELVE-SYSTEM REVIEW OF SYSTEMS:  CONSTITUTIONAL: No fever although the patient feels hot and flushed occasionally. Positive fatigue. Positive weight loss of 8 pounds.  EYES: No blurry vision, double vision or pain.  EARS, NOSE, THROAT: No difficulty swallowing or tinnitus.  RESPIRATORY: Occasional cough. No wheezing. No hemoptysis. No painful respirations. No COPD. CARDIOVASCULAR: No chest pain, orthopnea, edema, arrhythmia, palpitations.  GASTROINTESTINAL: Positive nausea. Positive vomiting. Positive constipation. Positive abdominal pain, as mentioned above. The patient denies any hemorrhoids, rectal bleeding or hematemesis.  GENITOURINARY: No dysuria, hematuria, or changes in frequency.  GYNECOLOGIC: No breast masses.  ENDOCRINOLOGY: No polyuria, polydipsia, polyphagia, cold or heat intolerance. HEMATOLOGIC AND LYMPHATIC: No anemia, easy bruising or bleeding.  SKIN: No rashes or petechiae. MUSCULOSKELETAL: No joint effusions. Positive severe back pain, hip pain.  PSYCHIATRIC: No anxiety or agitation.  NEUROLOGIC: No numbness or tingling.   PAST MEDICAL HISTORY:  1.  Osteoarthritis.  2.  Gestational diabetes.  3.  Hypertension.   ALLERGIES: No known drug allergies.   PAST SURGICAL HISTORY: Two C-sections.   FAMILY HISTORY: MI in her sister and another sister had skin cancer. Negative for CVA.   CURRENT MEDICATIONS: Hydrochlorothiazide and omeprazole.   SOCIAL HISTORY: The patient never smoked. Does not drink. Works at [employer]. She lives with her husband an 3 daughters.   PHYSICAL EXAMINATION: VITAL SIGNS: Blood pressure 184/74, pulse 119, respirations 18, temperature 98.4.  GENERAL: The patient is alert and oriented x3. No acute  distress. No respiratory distress. Looks chronically  debilitated, looks weak and looks pale, but hemodynamically stable.  HEENT: Her pupils are equal and reactive. Extraocular movements are intact. Mucosa is dry. Anicteric sclerae. Pale conjunctivae. No oral lesions. No oropharyngeal exudates.  NECK: Supple. No JVD. No thyromegaly. No adenopathy. No carotid bruits. No rigidity.  HEART: Regular rate and rhythm, hyperdynamic, tachycardic. No displacement of PMI. No murmurs appreciated. No displacement of PMI. No tenderness to palpation on anterior chest wall.  LUNGS: Clear without any wheezing or crepitus. No use of accessory muscles.  ABDOMEN: Soft, mildly distended, tender to palpation at the level of epigastrium and mesogastrium. No rebound tenderness. Tympanitic to palpation.  GENITAL: Deferred.  EXTREMITIES: No edema, cyanosis or clubbing. Pulses +2. Capillary refill less than 3.  SKIN: No rashes or petechiae.  MUSCULOSKELETAL: No joint effusions or joint swelling. Tenderness at the level of both hips and lower extremity.  LYMPHATIC: Negative for lymphadenopathy in neck or supraclavicular areas.  NEUROLOGIC: Cranial nerves II through XII intact. Strength is 5/5 in all 4 extremities.   DIAGNOSTIC DATA: Glucose 161, creatinine 4.28, sodium 133, potassium 3.4. GFR around 13. Calcium is 12.5. Lipase 892. Alkaline phosphatase 142. White count is 13, hemoglobin  10.2, and platelet count 263.   Urinalysis has 6 white blood cells, 2 red blood cells, no nitrite, no leukocyte esterase.  EKG: Tachycardiac. No significant ST depression or elevation, mild criteria for LVH.  CT scan of the abdomen and pelvis shows innumerable lytic lesions to the skeleton. Could be from metastases and multiple myeloma. Pathologic fracturing of the T11 vertebral body with height loss. Early spinal canal extension at the level of T10-T11. No evidence of primary malignancy of the abdomen or lower chest.   ASSESSMENT AND PLAN: This is a nice 49 year old female with history of  abdominal pain, back pain, nausea, vomiting, and hypertension.  1.  Abdominal pain, nausea and vomiting. At this moment, this is secondary effect of the hypercalcemia very likely as the patient has a calcium of 12.7. She has been severely constipated. The patient looks to have significant amount of stool. We are going to give her medications for nausea, medications for the pain, and try to keep good cathartics. IV fluids given to  increase diuresis due to the calcium as well bisphosphonates.   2.  Possible multiple myeloma. Constellations of symptoms include hyperglycemia, lytic lesions and acute kidney injury. UPEP and SPEP have been ordered,  PTH. Consultation with hematology/oncology due to the hypercalcemia which is overall symptomatic. We are going to give her pamidronate or Aredia to lower down the calcium levels. We are going to continue diuresis with NS, especially since the patient is going to be n.p.o. We are going to give her KCl to improve her potassium as well as saline solution to increase her sodium levels due to intravascular volume depletion.  3.  As far as her hypertension, the patient remains hypertensive. We are going to give her p.r.n. hydralazine. I am going to hold on her hydrochlorothiazide for now.  4.  Gastrointestinal prophylaxis with Protonix.  5.  Deep vein thrombosis prophylaxis with heparin.  6.  Acute kidney injury. IV fluids. This is likely secondary to Bence-Jones protein related problems. Call nephrology consultation.   CODE STATUS: The patient is a FULL code.   TIME SPENT: I spent about 60 minutes with this patient giving her education about her disease, answering questions.   The patient has an ileus for what we are going to  keep her n.p.o. for now, only ice chips. Whenever the ileus improves and the constipation has resolved, the patient can start eating.    ____________________________ [place] Sink, MD rsg:sb D: 08/07/2013 07:06:57  ET T: 08/07/2013 07:32:45 ET JOB#: [id number]  cc: [place] Sink, MD, <Dictator> [employer] Brown MD ELECTRONICALLY SIGNED 08/12/2013 19:31

## 2014-06-29 NOTE — Discharge Summary (Signed)
PATIENT NAME:  Jamie Keller, Jamie Keller MR#:  [id number] DATE OF BIRTH:  Sep 13, 1965  DATE OF ADMISSION:  08/07/2013 DATE OF DISCHARGE:  08/10/2013  ADMITTING DIAGNOSIS:  Suspected multiple myeloma.   DISCHARGE DIAGNOSES:   1.  Suspected multiple myeloma with lytic bone lesions throughout skeleton with significant bone pain.   2.  Hypercalcemia, status post pamidronate on 08/07/2013.  3.  Acute renal failure, improving.  4.  Malignant hypertension.  5.  Acute pancreatitis due to hypercalcemia.  6.  Diabetes mellitus.  Hemoglobin A1c 8.7.  7.  Acute anxiety.  8.  Anemia of chronic disease.  9.  Hypokalemia. 10.  History of osteoarthritis.  11.  Hypertension. 12.  Gestational diabetes mellitus.   DISCHARGE CONDITION:  Stable.   DISCHARGE MEDICATIONS:  The patient is to continue:  1.  Omeprazole 20 mg by mouth daily.  2.  Acetaminophen/hydrocodone 325/5 mg 1 tablet every four hours as needed.  3.  Alprazolam 0.25 mg every eight hours as needed.  4.  Verapamil 40 mg by mouth twice daily.  5.  Glipizide 2.5 mg once daily.   The patient is not to take HCTZ, amoxicillin or clarithromycin.   DIET:  2 gram salt, low fat, low cholesterol, carbohydrate-controlled diet, mechanical soft.   ACTIVITY LIMITATIONS:  As tolerated.    FOLLOWUP APPOINTMENT:  With Dr. Candiss Norse in two days after discharge done.  Dr. Grayland Ormond in one week after discharge.    CONSULTANTS:  Care management, social work, Dr. Candiss Norse as well as Dr. Grayland Ormond.   RADIOLOGIC STUDIES:  CT scan of the abdomen and pelvis without contrast, 08/07/2013, revealing diffuse skeletal malignancy which could be from metastatic disease or multiple myeloma.  There is pathologic fracturing of the T11 vertebral body with mild height loss, early spinal canal extension at the levels of T10 and L1.  MRI could further evaluate.  No evidence of primary malignancy in the abdomen or lower chest.  CT of chest without contrast 08/07/2013 revealed innumerable  lytic lesions throughout the visualized axial and appendicular skeleton compatible with either widespread metastatic disease to the bone or multiple myeloma.  No primary bronchogenic neoplasm identified.  Pathologic compression fracture at T12 with approximately 30% loss of anterior vertebral body height and numerous pathologic rib fractures bilaterally as above.  Severe hepatic steatosis.  MRI of thoracic spine without contrast 08/07/2013 revealing diffuse marrow signal abnormality which is consistent with neoplastic process, either metastatic disease or multiple myeloma, areas of cortical destruction with epidural tumor causing mass effect of the thecal sac were identified at T10, L11 and L1 mild compression fracture of T11 was seen.  There is no cord compression or cord signal abnormality.   HOSPITAL COURSE:  The patient is a 49 year old Spanish female with past medical history significant for history of diabetes mellitus, history of hypertension, who presents to the hospital on 08/07/2013 with complaints of nausea, vomiting, abdominal pain as well as back pain.  Please refer to Dr. Laurin Coder Gutierrez's admission note on 08/07/2013.  On arrival to the hospital, the patient's vital signs, temperature was 98.4, pulse was 119, respiration rate was 18, blood pressure 184/74.  Physical exam revealed mildly distended abdomen, tender to palpation in the epigastrium as well as mesogastrium and tympanitic to percussion.  The patient's lab data done on admission on 08/06/2013 revealed a glucose level of 161, BUN and creatinine were 52 and 4.29, sodium 133, potassium was 3.4, estimated GFR for non-African American was 11.  The patient's calcium level was 12.5,  and lipase level was elevated at 892.  The patient's total protein was 8.3, otherwise BMP was unremarkable.  However, the patient's alkaline phosphatase was 142.  Troponin was 0.04.  TSH was low at 0.278; however, the patient's Free thyroxine was 1.46 which was  within normal limits.  White blood cell count was elevated to 13.9, hemoglobin was 10.3, platelet count 263, absolute neutrophil count was 7.6.  Urinalysis revealed 6 white blood cells, 2 red blood cells, 1+ bacteria.  Urine cultures were negative.  The patient's EKG showed sinus tach at 121 beats per minute, voltage criteria for LVH, abnormal EKG, but no prior EKG was available to compare with.  The patient was admitted to the hospital for further evaluation.  UPEP as well as SPEP were taken for evaluation for possible multiple myeloma.  Consultations with nephrologist, Dr. Candiss Norse, as well as oncologist, Dr. Grayland Ormond, were obtained.  Dr. Grayland Ormond as well as Dr. Candiss Norse saw the patient in consultation early in the day of admission and followed her along.  The patient was started on IV fluids for hypercalcemia, high rate IV fluids with which kidney function improved as time progressed.  On the day of admission, the patient's creatinine was 4.29, it improved to 3.25 on the day of discharge, 08/10/2013 with estimated GFR rising from 11 to 16.  The patient was advised to drink plenty of fluids and follow up with nephrologist for further recommendations.    In regards to hypercalcemia, the patient was given one dose of pamidronate with improvement of her calcium.  The patient's calcium level on the day of discharge 08/10/2013 was 8.6.  The patient had intact PTH checked which was found to be normal at 21.  There was concern the patient may have underlying primary or secondary hyperparathyroidism.  The patient was seen by oncologist Dr. Grayland Ormond and SPEP and UPEP were ordered as mentioned above.  The patient had protein electrophoresis, urine protein electrophoresis results already by the day of discharge, which showed high total urine protein excretion at 108 mg/dL, calculated 24 hour protein excretion was 3.5 grams in 24 hours.  The patient had M-spike with almost 2.7 grams of protein in 24 hours.  Unfortunately, no SPEP  results were available during the time of dictation.  The patient is to follow up with Dr. Grayland Ormond in the next one week after discharge for further recommendations in regards to therapy for suspected multiple myeloma.  As mentioned above, the patient presented with nausea, vomiting, epigastric abdominal pain.  The patient did have elevated lipase level and although CT scan of abdomen and pelvis did not show any pancreatic inflammation, however her presentation was overall consistent with acute pancreatitis due to hypercalcemia.  The patient was given liquid diet initially and her lipase level improved as well as her condition.  On the day of discharge, she was able to eat a regular diet, soft, low-fat, low-cholesterol diet and was felt that she is stable to be discharged home.  She was advised to drink plenty of fluids and to keep up with urinary losses.   In regards to hypertension, the patient was noted to have malignant hypertension with rehydration.  She was initiated on Norvasc with some improvement of her blood pressure; however, since Norvasc is very expensive for this patient who does not have insurance we made a decision to start her on verapamil.  It is recommended to advance verapamil dose depending on her blood pressure readings as outpatient.   In regards to hypokalemia,  the potassium level was supplemented orally while the patient was in the hospital.   In regards to anemia, the patient was noted to have significant anemia with rehydration with hemoglobin level dropping down to 7.8 on the day of discharge from a level of 10.3 on the day of admission.  It was felt to be anemia of rehydration.  The patient did have iron studies checked while she was in the hospital and iron level was normal at 84.  The patient's unbound iron binding capacity was 88.  The patient's total iron binding capacity was 172, and ferritin level was 772.  It was felt that the patient's anemia is anemia of chronic disease  and possibly related to longstanding multiple myeloma.  The patient was advised to follow up with her nephrologist for further recommendations in regards to anemia management.   In regards to diabetes mellitus, the patient's hemoglobin A1c was checked and was found to be 8.7.  It was felt that the patient would benefit from initiation of diabetic medications, and low dose of glipizide was initiated for the patient.  The patient's glipizide should be advanced to better control her blood glucose levels.  Meanwhile, the patient was seen by dietitian while she was in the hospital and recommended diabetic diet.  She was also evaluated by lifestyle center, and lifestyle outpatient center followup will be recommended for her upon discharge.  The patient on the day of discharge, 08/10/2013, the patient felt satisfactory, did not complain of any significant discomfort.  Her vitals were stable with temperature of 98.3, pulse ranging from 96 to 105 and respiratory rate was 18 to 20, blood pressure 139/71, saturation 99% on room air at rest.   TIME SPENT:  40 minutes.    ____________________________ Theodoro Grist, MD rv:ea D: 08/10/2013 18:15:04 ET T: 08/10/2013 23:27:54 ET JOB#: [id number]  cc: Theodoro Grist, MD, <Dictator> Dr. Rodney Booze. Grayland Ormond, MD Theodoro Grist MD ELECTRONICALLY SIGNED 08/29/2013 14:43

## 2014-07-01 LAB — PROT IMMUNOELECTROPHORES(ARMC)

## 2014-07-24 ENCOUNTER — Other Ambulatory Visit: Payer: Self-pay | Admitting: Oncology

## 2014-07-24 DIAGNOSIS — C9001 Multiple myeloma in remission: Secondary | ICD-10-CM

## 2014-07-25 ENCOUNTER — Inpatient Hospital Stay: Payer: Self-pay

## 2014-07-25 ENCOUNTER — Inpatient Hospital Stay: Payer: Self-pay | Attending: Oncology

## 2014-07-25 VITALS — BP 104/65 | HR 81 | Wt 121.7 lb

## 2014-07-25 DIAGNOSIS — C9000 Multiple myeloma not having achieved remission: Secondary | ICD-10-CM | POA: Insufficient documentation

## 2014-07-25 DIAGNOSIS — Z79899 Other long term (current) drug therapy: Secondary | ICD-10-CM | POA: Insufficient documentation

## 2014-07-25 DIAGNOSIS — C9001 Multiple myeloma in remission: Secondary | ICD-10-CM

## 2014-07-25 LAB — BASIC METABOLIC PANEL
Anion gap: 5 (ref 5–15)
BUN: 28 mg/dL — AB (ref 6–20)
CALCIUM: 9.2 mg/dL (ref 8.9–10.3)
CHLORIDE: 107 mmol/L (ref 101–111)
CO2: 25 mmol/L (ref 22–32)
Creatinine, Ser: 1.01 mg/dL — ABNORMAL HIGH (ref 0.44–1.00)
GFR calc Af Amer: >60 mL/min (ref >60)
GLUCOSE: 171 mg/dL — AB (ref 65–99)
Potassium: 3.7 mmol/L (ref 3.5–5.1)
Sodium: 137 mmol/L (ref 135–145)

## 2014-07-25 MED ORDER — SODIUM CHLORIDE 0.9 % IV SOLN
Freq: Once | INTRAVENOUS | Status: AC
  Administered 2014-07-25: 11:00:00 via INTRAVENOUS
  Filled 2014-07-25: qty 250

## 2014-07-25 MED ORDER — SODIUM CHLORIDE 0.9 % IJ SOLN
10.0000 mL | INTRAMUSCULAR | Status: DC | PRN
  Administered 2014-07-25: 10 mL
  Filled 2014-07-25: qty 10

## 2014-07-25 MED ORDER — HEPARIN SOD (PORK) LOCK FLUSH 100 UNIT/ML IV SOLN: 250.0000 [IU] | Freq: Once | INTRAVENOUS | Status: AC | PRN

## 2014-07-25 MED ORDER — SODIUM CHLORIDE 0.9 % IJ SOLN
3.0000 mL | Freq: Once | INTRAMUSCULAR | Status: AC | PRN
  Filled 2014-07-25: qty 10

## 2014-07-25 MED ORDER — HEPARIN SOD (PORK) LOCK FLUSH 100 UNIT/ML IV SOLN
500.0000 [IU] | Freq: Once | INTRAVENOUS | Status: AC | PRN
  Administered 2014-07-25: 500 [IU]
  Filled 2014-07-25: qty 5

## 2014-07-25 MED ORDER — ZOLEDRONIC ACID 4 MG/5ML IV CONC
3.5000 mg | Freq: Once | INTRAVENOUS | Status: AC
  Administered 2014-07-25: 3.5 mg via INTRAVENOUS
  Filled 2014-07-25: qty 4.38

## 2014-07-26 LAB — PROTEIN ELECTROPHORESIS, SERUM
A/G Ratio: 1.1 (ref 0.7–2.0)
ALPHA-2-GLOBULIN: 0.8 g/dL (ref 0.4–1.2)
Albumin ELP: 3.5 g/dL (ref 3.2–5.6)
Alpha-1-Globulin: 0.2 g/dL (ref 0.1–0.4)
Beta Globulin: 1 g/dL (ref 0.6–1.3)
Gamma Globulin: 1.1 g/dL (ref 0.5–1.6)
Globulin, Total: 3.1 g/dL (ref 2.0–4.5)
TOTAL PROTEIN ELP: 6.6 g/dL (ref 6.0–8.5)

## 2014-08-22 ENCOUNTER — Inpatient Hospital Stay: Payer: Self-pay

## 2014-08-22 ENCOUNTER — Encounter: Payer: Self-pay | Admitting: Oncology

## 2014-08-22 ENCOUNTER — Inpatient Hospital Stay: Payer: Self-pay | Attending: Oncology | Admitting: Oncology

## 2014-08-22 VITALS — BP 149/64 | HR 71 | Temp 97.4°F | Resp 17 | Wt 122.8 lb

## 2014-08-22 DIAGNOSIS — C9001 Multiple myeloma in remission: Secondary | ICD-10-CM

## 2014-08-22 DIAGNOSIS — C9000 Multiple myeloma not having achieved remission: Secondary | ICD-10-CM | POA: Insufficient documentation

## 2014-08-22 DIAGNOSIS — I10 Essential (primary) hypertension: Secondary | ICD-10-CM | POA: Insufficient documentation

## 2014-08-22 DIAGNOSIS — Z79899 Other long term (current) drug therapy: Secondary | ICD-10-CM | POA: Insufficient documentation

## 2014-08-22 DIAGNOSIS — M1990 Unspecified osteoarthritis, unspecified site: Secondary | ICD-10-CM | POA: Insufficient documentation

## 2014-08-22 DIAGNOSIS — M549 Dorsalgia, unspecified: Secondary | ICD-10-CM | POA: Insufficient documentation

## 2014-08-22 DIAGNOSIS — Z923 Personal history of irradiation: Secondary | ICD-10-CM | POA: Insufficient documentation

## 2014-08-22 LAB — BASIC METABOLIC PANEL
Anion gap: 5 (ref 5–15)
BUN: 30 mg/dL — AB (ref 6–20)
CO2: 24 mmol/L (ref 22–32)
CREATININE: 1.03 mg/dL — AB (ref 0.44–1.00)
Calcium: 8.7 mg/dL — ABNORMAL LOW (ref 8.9–10.3)
Chloride: 105 mmol/L (ref 101–111)
Glucose, Bld: 141 mg/dL — ABNORMAL HIGH (ref 65–99)
Potassium: 3.7 mmol/L (ref 3.5–5.1)
Sodium: 134 mmol/L — ABNORMAL LOW (ref 135–145)

## 2014-08-22 MED ORDER — HEPARIN SOD (PORK) LOCK FLUSH 100 UNIT/ML IV SOLN
500.0000 [IU] | Freq: Once | INTRAVENOUS | Status: DC | PRN
  Filled 2014-08-22: qty 5

## 2014-08-22 MED ORDER — ALTEPLASE 2 MG IJ SOLR: 2.0000 mg | Freq: Once | INTRAMUSCULAR | Status: DC | PRN

## 2014-08-22 MED ORDER — SODIUM CHLORIDE 0.9 % IJ SOLN
10.0000 mL | INTRAMUSCULAR | Status: DC | PRN
  Filled 2014-08-22: qty 10

## 2014-08-22 MED ORDER — ZOLEDRONIC ACID 4 MG/5ML IV CONC
3.5000 mg | Freq: Once | INTRAVENOUS | Status: AC
  Administered 2014-08-22: 3.5 mg via INTRAVENOUS
  Filled 2014-08-22: qty 4.38

## 2014-08-22 MED ORDER — SODIUM CHLORIDE 0.9 % IV SOLN
Freq: Once | INTRAVENOUS | Status: AC
  Administered 2014-08-22: 16:00:00 via INTRAVENOUS
  Filled 2014-08-22: qty 1000

## 2014-08-22 MED ORDER — HEPARIN SOD (PORK) LOCK FLUSH 100 UNIT/ML IV SOLN: 250.0000 [IU] | Freq: Once | INTRAVENOUS | Status: DC | PRN

## 2014-08-22 MED ORDER — SODIUM CHLORIDE 0.9 % IJ SOLN
3.0000 mL | Freq: Once | INTRAMUSCULAR | Status: DC | PRN
  Filled 2014-08-22: qty 10

## 2014-09-09 NOTE — Progress Notes (Signed)
[facility]  [phone] Fax:(336) [phone]  ID: Jamie Keller OB: 12-22-1965  MR#: [id number]  [facility]#:[id number]  Patient Care Team: No Pcp Per Patient as PCP - General (General Practice)  CHIEF COMPLAINT:  Chief Complaint  Patient presents with  . Follow-up    multiple myeloma    INTERVAL HISTORY: Patient returns to clinic today for further evaluation and continuation of Zometa. She continues to have chronic back pain which radiates down her legs which is unchanged. She otherwise feels well and is asymptomatic. She has no other neurologic complaints. She denies any recent fevers. She has a fair appetite. She denies any chest pain or shortness of breath.  She denies any nausea, vomiting, constipation, or diarrhea. She denies any other pain. She has no urinary complaints. Patient offers no further specific complaints today.  REVIEW OF SYSTEMS:   Review of Systems  Constitutional: Negative.   Respiratory: Negative.   Cardiovascular: Negative.   Musculoskeletal: Positive for back pain.    As per HPI. Otherwise, a complete review of systems is negatve.  PAST MEDICAL HISTORY: Past Medical History  Diagnosis Date  . Hypertension   . Osteoarthritis     PAST SURGICAL HISTORY: Past Surgical History  Procedure Laterality Date  . Cesarean section      x2    FAMILY HISTORY No family history on file.     ADVANCED DIRECTIVES:    HEALTH MAINTENANCE: History  Substance Use Topics  . Smoking status: Never Smoker   . Smokeless tobacco: Never Used  . Alcohol Use: No     Colonoscopy:  PAP:  Bone density:  Lipid panel:  Allergies  Allergen Reactions  . No Known Allergies     Current Outpatient Prescriptions  Medication Sig Dispense Refill  . glipiZIDE (GLUCOTROL) 5 MG tablet Take 5 mg by mouth.    . verapamil (CALAN) 80 MG tablet Take 80 mg by mouth.     No current facility-administered medications for this visit.    Facility-Administered Medications Ordered in Other Visits  Medication Dose Route Frequency Provider Last Rate Last Dose  . sodium chloride 0.9 % injection 10 mL  10 mL Intracatheter PRN Lloyd Huger, MD   10 mL at 07/25/14 1000    OBJECTIVE: Filed Vitals:   08/22/14 1640  BP: 149/64  Pulse: 71  Temp: 97.4 F (36.3 C)  Resp: 17     There is no height on file to calculate BMI.    ECOG FS:0 - Asymptomatic  General: Well-developed, well-nourished, no acute distress. Eyes: anicteric sclera. Lungs: Clear to auscultation bilaterally. Heart: Regular rate and rhythm. No rubs, murmurs, or gallops. Abdomen: Soft, nontender, nondistended. No organomegaly noted, normoactive bowel sounds. Musculoskeletal: No edema, cyanosis, or clubbing. Neuro: Alert, answering all questions appropriately. Cranial nerves grossly intact. Skin: No rashes or petechiae noted. Psych: Normal affect.   LAB RESULTS:  Lab Results  Component Value Date   NA 134* 08/22/2014   K 3.7 08/22/2014   CL 105 08/22/2014   CO2 24 08/22/2014   GLUCOSE 141* 08/22/2014   BUN 30* 08/22/2014   CREATININE 1.03* 08/22/2014   CALCIUM 8.7* 08/22/2014   PROT 6.9 04/15/2014   ALBUMIN 3.3* 04/15/2014   AST 22 04/15/2014   ALT 45 04/15/2014   ALKPHOS 103 04/15/2014   GFRNONAA >60 08/22/2014   GFRAA >60 08/22/2014    Lab Results  Component Value Date   WBC 8.2 06/27/2014   NEUTROABS 6.1 06/27/2014   HGB 11.7* 06/27/2014  HCT 34.0* 06/27/2014   MCV 88 06/27/2014   PLT 267 06/27/2014     STUDIES: No results found.  ASSESSMENT: Multiple Myeloma with multiple lytic lesions.   PLAN:    1. Multiple Myeloma. Patient has had an excellent response to her treatments as creatinine has returned to normal and pain is grealty improved. Her M spike continues to be undetectable. She was now completed her XRT as well as maintenance Velcade. Continue Zometa every 4 weeks.  Patient initiated Zometa in November 2015. Because  of patient's immigration status, she cannot undergo transplant. Return to clinic in 4 weeks for Zometa only and then in 8 weeks for laboratory work, further evaluation, and continuation of Zometa only. 2.  2. Pain: Patient has now completed XRT.  3. Back pain: Patient has been evaluated by neurosurgery at [facility].  The entire visit was done in the presence of an interpreter.   Patient expressed understanding and was in agreement with this plan. She also understands that She can call clinic at any time with any questions, concerns, or complaints.   No matching staging information was found for the patient.  Lloyd Huger, MD   09/09/2014 12:37 PM

## 2014-09-19 ENCOUNTER — Inpatient Hospital Stay: Payer: Self-pay

## 2014-09-19 ENCOUNTER — Inpatient Hospital Stay: Payer: Self-pay | Attending: Oncology

## 2014-09-19 VITALS — BP 135/83 | HR 89 | Temp 96.8°F | Resp 20

## 2014-09-19 DIAGNOSIS — C9001 Multiple myeloma in remission: Secondary | ICD-10-CM | POA: Insufficient documentation

## 2014-09-19 DIAGNOSIS — Z79899 Other long term (current) drug therapy: Secondary | ICD-10-CM | POA: Insufficient documentation

## 2014-09-19 LAB — BASIC METABOLIC PANEL
Anion gap: 5 (ref 5–15)
BUN: 22 mg/dL — ABNORMAL HIGH (ref 6–20)
CHLORIDE: 107 mmol/L (ref 101–111)
CO2: 24 mmol/L (ref 22–32)
Calcium: 8.8 mg/dL — ABNORMAL LOW (ref 8.9–10.3)
Creatinine, Ser: 1 mg/dL (ref 0.44–1.00)
GFR calc Af Amer: >60 mL/min (ref >60)
GLUCOSE: 104 mg/dL — AB (ref 65–99)
POTASSIUM: 3.7 mmol/L (ref 3.5–5.1)
SODIUM: 136 mmol/L (ref 135–145)

## 2014-09-19 MED ORDER — HEPARIN SOD (PORK) LOCK FLUSH 100 UNIT/ML IV SOLN
500.0000 [IU] | Freq: Once | INTRAVENOUS | Status: AC | PRN
  Administered 2014-09-19: 500 [IU]
  Filled 2014-09-19: qty 5

## 2014-09-19 MED ORDER — SODIUM CHLORIDE 0.9 % IV SOLN
Freq: Once | INTRAVENOUS | Status: AC
  Administered 2014-09-19: 15:00:00 via INTRAVENOUS
  Filled 2014-09-19: qty 1000

## 2014-09-19 MED ORDER — ZOLEDRONIC ACID 4 MG/5ML IV CONC
3.5000 mg | Freq: Once | INTRAVENOUS | Status: AC
  Administered 2014-09-19: 3.5 mg via INTRAVENOUS
  Filled 2014-09-19: qty 4.38

## 2014-09-19 MED ORDER — SODIUM CHLORIDE 0.9 % IJ SOLN
10.0000 mL | INTRAMUSCULAR | Status: DC | PRN
  Administered 2014-09-19: 10 mL
  Filled 2014-09-19: qty 10

## 2014-10-17 ENCOUNTER — Inpatient Hospital Stay: Payer: Self-pay

## 2014-10-17 ENCOUNTER — Inpatient Hospital Stay: Payer: Self-pay | Attending: Oncology | Admitting: Oncology

## 2014-10-17 VITALS — BP 155/83 | HR 77 | Temp 98.7°F | Resp 16 | Wt 125.0 lb

## 2014-10-17 DIAGNOSIS — M1990 Unspecified osteoarthritis, unspecified site: Secondary | ICD-10-CM | POA: Insufficient documentation

## 2014-10-17 DIAGNOSIS — C9001 Multiple myeloma in remission: Secondary | ICD-10-CM

## 2014-10-17 DIAGNOSIS — I10 Essential (primary) hypertension: Secondary | ICD-10-CM | POA: Insufficient documentation

## 2014-10-17 DIAGNOSIS — Z923 Personal history of irradiation: Secondary | ICD-10-CM | POA: Insufficient documentation

## 2014-10-17 DIAGNOSIS — M549 Dorsalgia, unspecified: Secondary | ICD-10-CM | POA: Insufficient documentation

## 2014-10-17 DIAGNOSIS — Z79899 Other long term (current) drug therapy: Secondary | ICD-10-CM | POA: Insufficient documentation

## 2014-10-17 DIAGNOSIS — M542 Cervicalgia: Secondary | ICD-10-CM | POA: Insufficient documentation

## 2014-10-17 DIAGNOSIS — C9000 Multiple myeloma not having achieved remission: Secondary | ICD-10-CM | POA: Insufficient documentation

## 2014-10-17 LAB — BASIC METABOLIC PANEL
Anion gap: 5 (ref 5–15)
BUN: 23 mg/dL — AB (ref 6–20)
CO2: 23 mmol/L (ref 22–32)
Calcium: 8.4 mg/dL — ABNORMAL LOW (ref 8.9–10.3)
Chloride: 107 mmol/L (ref 101–111)
Creatinine, Ser: 0.86 mg/dL (ref 0.44–1.00)
GFR calc Af Amer: >60 mL/min (ref >60)
GFR calc non Af Amer: >60 mL/min (ref >60)
GLUCOSE: 98 mg/dL (ref 65–99)
POTASSIUM: 3.4 mmol/L — AB (ref 3.5–5.1)
Sodium: 135 mmol/L (ref 135–145)

## 2014-10-17 MED ORDER — ZOLEDRONIC ACID 4 MG/100ML IV SOLN
4.0000 mg | Freq: Once | INTRAVENOUS | Status: DC
  Administered 2014-10-17: 4 mg via INTRAVENOUS
  Filled 2014-10-17: qty 100

## 2014-10-17 MED ORDER — SODIUM CHLORIDE 0.9 % IJ SOLN
10.0000 mL | INTRAMUSCULAR | Status: DC | PRN
  Administered 2014-10-17: 10 mL via INTRAVENOUS
  Filled 2014-10-17: qty 10

## 2014-10-17 MED ORDER — SODIUM CHLORIDE 0.9 % IV SOLN
Freq: Once | INTRAVENOUS | Status: AC
  Administered 2014-10-17: 15:00:00 via INTRAVENOUS
  Filled 2014-10-17: qty 1000

## 2014-10-17 MED ORDER — HEPARIN SOD (PORK) LOCK FLUSH 100 UNIT/ML IV SOLN
500.0000 [IU] | Freq: Once | INTRAVENOUS | Status: AC
  Administered 2014-10-17: 500 [IU] via INTRAVENOUS
  Filled 2014-10-17: qty 5

## 2014-10-17 MED ORDER — HEPARIN SOD (PORK) LOCK FLUSH 100 UNIT/ML IV SOLN: 500.0000 [IU] | Freq: Once | INTRAVENOUS | Status: DC | PRN

## 2014-10-20 NOTE — Progress Notes (Signed)
[place]  [phone] [phone]) [phone]  ID: Jamie Keller OB: [birth year]  MR#: [id number]  RKY#:[id number]  Patient Care Team: Denton Lank, MD as PCP - General (Family Medicine)  CHIEF COMPLAINT:  Chief Complaint  Patient presents with  . Follow-up    multiple myeloma    INTERVAL HISTORY: Patient returns to clinic today for further evaluation and continuation of Zometa. She continues to have chronic back pain which radiates down her legs which is unchanged. She otherwise feels well and is asymptomatic. She has no other neurologic complaints. She denies any recent fevers. She has a fair appetite. She denies any chest pain or shortness of breath.  She denies any nausea, vomiting, constipation, or diarrhea. She denies any other pain. She has no urinary complaints. Patient offers no further specific complaints today.  REVIEW OF SYSTEMS:   Review of Systems  Constitutional: Negative.   Respiratory: Negative.   Cardiovascular: Negative.   Musculoskeletal: Positive for back pain and neck pain.    As per HPI. Otherwise, a complete review of systems is negatve.  PAST MEDICAL HISTORY: Past Medical History  Diagnosis Date  . Hypertension   . Osteoarthritis     PAST SURGICAL HISTORY: Past Surgical History  Procedure Laterality Date  . Cesarean section      x2    FAMILY HISTORY No family history on file.     ADVANCED DIRECTIVES:    HEALTH MAINTENANCE: Social History  Substance Use Topics  . Smoking status: Never Smoker   . Smokeless tobacco: Never Used  . Alcohol Use: No     Colonoscopy:  PAP:  Bone density:  Lipid panel:  Allergies  Allergen Reactions  . No Known Allergies     Current Outpatient Prescriptions  Medication Sig Dispense Refill  . glipiZIDE (GLUCOTROL) 5 MG tablet Take 5 mg by mouth.    . verapamil (CALAN) 80 MG tablet Take 80 mg by mouth.     No current facility-administered medications for this  visit.    OBJECTIVE: Filed Vitals:   10/17/14 1439  BP: 155/83  Pulse: 77  Temp: 98.7 F (37.1 C)  Resp: 16     There is no height on file to calculate BMI.    ECOG FS:0 - Asymptomatic  General: Well-developed, well-nourished, no acute distress. Eyes: anicteric sclera. Lungs: Clear to auscultation bilaterally. Heart: Regular rate and rhythm. No rubs, murmurs, or gallops. Abdomen: Soft, nontender, nondistended. No organomegaly noted, normoactive bowel sounds. Musculoskeletal: No edema, cyanosis, or clubbing. Neuro: Alert, answering all questions appropriately. Cranial nerves grossly intact. Skin: No rashes or petechiae noted. Psych: Normal affect.   LAB RESULTS:  Lab Results  Component Value Date   NA 135 10/17/2014   K 3.4* 10/17/2014   CL 107 10/17/2014   CO2 23 10/17/2014   GLUCOSE 98 10/17/2014   BUN 23* 10/17/2014   CREATININE 0.86 10/17/2014   CALCIUM 8.4* 10/17/2014   PROT 6.9 04/15/2014   ALBUMIN 3.3* 04/15/2014   AST 22 04/15/2014   ALT 45 04/15/2014   ALKPHOS 103 04/15/2014   BILITOT 0.2 04/15/2014   GFRNONAA >60 10/17/2014   GFRAA >60 10/17/2014    Lab Results  Component Value Date   WBC 8.2 06/27/2014   NEUTROABS 6.1 06/27/2014   HGB 11.7* 06/27/2014   HCT 34.0* 06/27/2014   MCV 88 06/27/2014   PLT 267 06/27/2014     STUDIES: No results found.  ASSESSMENT: Multiple Myeloma with multiple lytic lesions.   PLAN:  1. Multiple Myeloma. Patient has had an excellent response to her treatments as creatinine has returned to normal and pain is grealty improved. Her M spike continues to be undetectable. She was now completed her XRT as well as maintenance Velcade. Continue Zometa every 4 weeks.  Patient initiated Zometa in November 2015. Because of patient's immigration status, she cannot undergo transplant. Return to clinic in 4 weeks for Zometa only and then in 8 weeks for laboratory work, further evaluation, and continuation of Zometa only.  2.  Pain: Patient has now completed XRT.  3. Back pain: Patient has been evaluated by neurosurgery at [facility].  The entire visit was done in the presence of an interpreter.   Patient expressed understanding and was in agreement with this plan. She also understands that She can call clinic at any time with any questions, concerns, or complaints.   No matching staging information was found for the patient.  Lloyd Huger, MD   10/20/2014 10:52 PM

## 2014-11-14 ENCOUNTER — Inpatient Hospital Stay: Payer: Self-pay

## 2014-11-14 ENCOUNTER — Inpatient Hospital Stay: Payer: Self-pay | Attending: Oncology

## 2014-11-14 VITALS — BP 123/74 | HR 66 | Temp 96.7°F

## 2014-11-14 DIAGNOSIS — C9000 Multiple myeloma not having achieved remission: Secondary | ICD-10-CM

## 2014-11-14 DIAGNOSIS — C9001 Multiple myeloma in remission: Secondary | ICD-10-CM

## 2014-11-14 DIAGNOSIS — Z79899 Other long term (current) drug therapy: Secondary | ICD-10-CM | POA: Insufficient documentation

## 2014-11-14 LAB — BASIC METABOLIC PANEL
Anion gap: 2 — ABNORMAL LOW (ref 5–15)
BUN: 26 mg/dL — AB (ref 6–20)
CHLORIDE: 105 mmol/L (ref 101–111)
CO2: 28 mmol/L (ref 22–32)
Calcium: 8.6 mg/dL — ABNORMAL LOW (ref 8.9–10.3)
Creatinine, Ser: 1.21 mg/dL — ABNORMAL HIGH (ref 0.44–1.00)
GFR calc Af Amer: 59 mL/min — ABNORMAL LOW (ref >60)
GFR calc non Af Amer: 51 mL/min — ABNORMAL LOW (ref >60)
Glucose, Bld: 100 mg/dL — ABNORMAL HIGH (ref 65–99)
POTASSIUM: 3.5 mmol/L (ref 3.5–5.1)
Sodium: 135 mmol/L (ref 135–145)

## 2014-11-14 MED ORDER — SODIUM CHLORIDE 0.9 % IJ SOLN
10.0000 mL | INTRAMUSCULAR | Status: DC | PRN
  Administered 2014-11-14: 10 mL
  Filled 2014-11-14: qty 10

## 2014-11-14 MED ORDER — HEPARIN SOD (PORK) LOCK FLUSH 100 UNIT/ML IV SOLN
500.0000 [IU] | Freq: Once | INTRAVENOUS | Status: AC
  Administered 2014-11-14: 500 [IU] via INTRAVENOUS
  Filled 2014-11-14: qty 5

## 2014-11-14 MED ORDER — SODIUM CHLORIDE 0.9 % IV SOLN
INTRAVENOUS | Status: DC
  Administered 2014-11-14: 15:00:00 via INTRAVENOUS
  Filled 2014-11-14: qty 1000

## 2014-11-14 MED ORDER — SODIUM CHLORIDE 0.9 % IV SOLN
3.5000 mg | Freq: Once | INTRAVENOUS | Status: AC
  Administered 2014-11-14: 3.5 mg via INTRAVENOUS
  Filled 2014-11-14: qty 4.38

## 2014-11-15 LAB — PROTEIN ELECTROPHORESIS, SERUM
A/G RATIO SPE: 1 (ref 0.7–1.7)
ALPHA-2-GLOBULIN: 0.7 g/dL (ref 0.4–1.0)
Albumin ELP: 3.2 g/dL (ref 2.9–4.4)
Alpha-1-Globulin: 0.2 g/dL (ref 0.0–0.4)
Beta Globulin: 1 g/dL (ref 0.7–1.3)
Gamma Globulin: 1.2 g/dL (ref 0.4–1.8)
Globulin, Total: 3.1 g/dL (ref 2.2–3.9)
Total Protein ELP: 6.3 g/dL (ref 6.0–8.5)

## 2014-12-12 ENCOUNTER — Inpatient Hospital Stay: Payer: Self-pay

## 2014-12-12 ENCOUNTER — Inpatient Hospital Stay (HOSPITAL_BASED_OUTPATIENT_CLINIC_OR_DEPARTMENT_OTHER): Payer: Self-pay | Admitting: Oncology

## 2014-12-12 ENCOUNTER — Inpatient Hospital Stay: Payer: Self-pay | Attending: Oncology

## 2014-12-12 VITALS — BP 148/84 | HR 81 | Temp 96.3°F | Wt 126.1 lb

## 2014-12-12 DIAGNOSIS — G8929 Other chronic pain: Secondary | ICD-10-CM

## 2014-12-12 DIAGNOSIS — C9001 Multiple myeloma in remission: Secondary | ICD-10-CM

## 2014-12-12 DIAGNOSIS — C9000 Multiple myeloma not having achieved remission: Secondary | ICD-10-CM

## 2014-12-12 DIAGNOSIS — M549 Dorsalgia, unspecified: Secondary | ICD-10-CM | POA: Insufficient documentation

## 2014-12-12 DIAGNOSIS — Z79899 Other long term (current) drug therapy: Secondary | ICD-10-CM

## 2014-12-12 DIAGNOSIS — I10 Essential (primary) hypertension: Secondary | ICD-10-CM | POA: Insufficient documentation

## 2014-12-12 DIAGNOSIS — Z7984 Long term (current) use of oral hypoglycemic drugs: Secondary | ICD-10-CM | POA: Insufficient documentation

## 2014-12-12 DIAGNOSIS — Z923 Personal history of irradiation: Secondary | ICD-10-CM | POA: Insufficient documentation

## 2014-12-12 LAB — BASIC METABOLIC PANEL
Anion gap: 7 (ref 5–15)
BUN: 19 mg/dL (ref 6–20)
CO2: 24 mmol/L (ref 22–32)
CREATININE: 0.99 mg/dL (ref 0.44–1.00)
Calcium: 8.5 mg/dL — ABNORMAL LOW (ref 8.9–10.3)
Chloride: 105 mmol/L (ref 101–111)
Glucose, Bld: 159 mg/dL — ABNORMAL HIGH (ref 65–99)
POTASSIUM: 3.8 mmol/L (ref 3.5–5.1)
SODIUM: 136 mmol/L (ref 135–145)

## 2014-12-12 MED ORDER — ZOLEDRONIC ACID 4 MG/100ML IV SOLN
4.0000 mg | Freq: Once | INTRAVENOUS | Status: AC
  Administered 2014-12-12: 4 mg via INTRAVENOUS
  Filled 2014-12-12: qty 100

## 2014-12-12 MED ORDER — HEPARIN SOD (PORK) LOCK FLUSH 100 UNIT/ML IV SOLN
500.0000 [IU] | Freq: Once | INTRAVENOUS | Status: AC | PRN
  Administered 2014-12-12: 500 [IU]
  Filled 2014-12-12: qty 5

## 2014-12-12 MED ORDER — ZOLPIDEM TARTRATE 10 MG PO TABS: 10.0000 mg | ORAL_TABLET | Freq: Every evening | ORAL | Status: DC | PRN

## 2014-12-12 MED ORDER — SODIUM CHLORIDE 0.9 % IV SOLN
Freq: Once | INTRAVENOUS | Status: AC
  Administered 2014-12-12: 16:00:00 via INTRAVENOUS
  Filled 2014-12-12: qty 1000

## 2014-12-12 NOTE — Progress Notes (Signed)
Patient complains of weakness  in her mid back.  Also states upper right arm feels strange at times after having radiation there. Patient not sleeping well.

## 2014-12-13 LAB — PROTEIN ELECTROPHORESIS, SERUM
A/G Ratio: 1.1 (ref 0.7–1.7)
ALPHA-1-GLOBULIN: 0.2 g/dL (ref 0.0–0.4)
ALPHA-2-GLOBULIN: 0.7 g/dL (ref 0.4–1.0)
Albumin ELP: 3.5 g/dL (ref 2.9–4.4)
BETA GLOBULIN: 1 g/dL (ref 0.7–1.3)
Gamma Globulin: 1.2 g/dL (ref 0.4–1.8)
Globulin, Total: 3.2 g/dL (ref 2.2–3.9)
M-SPIKE, %: 0.3 g/dL — AB
Total Protein ELP: 6.7 g/dL (ref 6.0–8.5)

## 2014-12-27 NOTE — Progress Notes (Signed)
[place]  Telephone:([phone] [phone]) [phone]  ID: [name] OB: 26-Aug-1965  MR#: [id number]  AJO#:[id number]  Patient Care Team: Denton Lank, MD as PCP - General (Family Medicine)  CHIEF COMPLAINT:  Chief Complaint  Patient presents with  . Follow-up    INTERVAL HISTORY: Patient returns to clinic today for further evaluation and continuation of Zometa. She continues to have chronic back pain which is unchanged. She otherwise feels well and is asymptomatic. She has no other neurologic complaints. She denies any recent fevers. She has a fair appetite. She denies any chest pain or shortness of breath.  She denies any nausea, vomiting, constipation, or diarrhea. She denies any other pain. She has no urinary complaints. Patient offers no further specific complaints today.  REVIEW OF SYSTEMS:   Review of Systems  Constitutional: Negative.   Respiratory: Negative.   Cardiovascular: Negative.   Musculoskeletal: Positive for back pain.    As per HPI. Otherwise, a complete review of systems is negatve.  PAST MEDICAL HISTORY: Past Medical History  Diagnosis Date  . Hypertension   . Osteoarthritis     PAST SURGICAL HISTORY: Past Surgical History  Procedure Laterality Date  . Cesarean section      x2    FAMILY HISTORY: Reviewed and unchanged. No reported history of malignancy or chronic disease.     ADVANCED DIRECTIVES:    HEALTH MAINTENANCE: Social History  Substance Use Topics  . Smoking status: Never Smoker   . Smokeless tobacco: Never Used  . Alcohol Use: No     Colonoscopy:  PAP:  Bone density:  Lipid panel:  Allergies  Allergen Reactions  . No Known Allergies     Current Outpatient Prescriptions  Medication Sig Dispense Refill  . glipiZIDE (GLUCOTROL) 5 MG tablet Take 5 mg by mouth.    . verapamil (CALAN) 80 MG tablet Take 80 mg by mouth.    . zolpidem (AMBIEN) 10 MG tablet Take 1 tablet (10 mg total) by mouth at  bedtime as needed for sleep. 30 tablet 2   No current facility-administered medications for this visit.    OBJECTIVE: Filed Vitals:   12/12/14 1453  BP: 148/84  Pulse: 81  Temp: 96.3 F (35.7 C)     There is no height on file to calculate BMI.    ECOG FS:0 - Asymptomatic  General: Well-developed, well-nourished, no acute distress. Eyes: anicteric sclera. Lungs: Clear to auscultation bilaterally. Heart: Regular rate and rhythm. No rubs, murmurs, or gallops. Abdomen: Soft, nontender, nondistended. No organomegaly noted, normoactive bowel sounds. Musculoskeletal: No edema, cyanosis, or clubbing. Neuro: Alert, answering all questions appropriately. Cranial nerves grossly intact. Skin: No rashes or petechiae noted. Psych: Normal affect.   LAB RESULTS:  Lab Results  Component Value Date   NA 136 12/12/2014   K 3.8 12/12/2014   CL 105 12/12/2014   CO2 24 12/12/2014   GLUCOSE 159* 12/12/2014   BUN 19 12/12/2014   CREATININE 0.99 12/12/2014   CALCIUM 8.5* 12/12/2014   PROT 6.9 04/15/2014   ALBUMIN 3.3* 04/15/2014   AST 22 04/15/2014   ALT 45 04/15/2014   ALKPHOS 103 04/15/2014   BILITOT 0.2 04/15/2014   GFRNONAA >60 12/12/2014   GFRAA >60 12/12/2014    Lab Results  Component Value Date   WBC 8.2 06/27/2014   NEUTROABS 6.1 06/27/2014   HGB 11.7* 06/27/2014   HCT 34.0* 06/27/2014   MCV 88 06/27/2014   PLT 267 06/27/2014     STUDIES: No  results found.  ASSESSMENT: Multiple Myeloma with multiple lytic lesions.   PLAN:    1. Multiple Myeloma. Patient has had an excellent response to her treatments as creatinine has returned to normal and pain is grealty improved. Her M spike has trended up slightly to 0.3. She was now completed her XRT as well as maintenance Velcade. Continue Zometa every 4 weeks.  Patient initiated Zometa in November 2015. Because of patient's immigration status, she cannot undergo transplant. Return to clinic in 4 weeks for Zometa only and then in  8 weeks for laboratory work, further evaluation, and continuation of Zometa only. Repeat SPEP at next lab draw. 2. Pain: Patient has now completed XRT.  3. Back pain: Patient has been evaluated by neurosurgery at [facility].  The entire visit was done in the presence of an interpreter.   Patient expressed understanding and was in agreement with this plan. She also understands that She can call clinic at any time with any questions, concerns, or complaints.   No matching staging information was found for the patient.  Lloyd Huger, MD   12/27/2014 6:18 PM

## 2015-01-09 ENCOUNTER — Inpatient Hospital Stay: Payer: Self-pay

## 2015-01-09 ENCOUNTER — Inpatient Hospital Stay: Payer: Self-pay | Attending: Oncology

## 2015-01-09 DIAGNOSIS — Z79899 Other long term (current) drug therapy: Secondary | ICD-10-CM | POA: Insufficient documentation

## 2015-01-09 DIAGNOSIS — C9001 Multiple myeloma in remission: Secondary | ICD-10-CM

## 2015-01-09 LAB — BASIC METABOLIC PANEL
ANION GAP: 5 (ref 5–15)
BUN: 20 mg/dL (ref 6–20)
CHLORIDE: 105 mmol/L (ref 101–111)
CO2: 24 mmol/L (ref 22–32)
Calcium: 8.3 mg/dL — ABNORMAL LOW (ref 8.9–10.3)
Creatinine, Ser: 0.82 mg/dL (ref 0.44–1.00)
GFR calc Af Amer: >60 mL/min (ref >60)
Glucose, Bld: 135 mg/dL — ABNORMAL HIGH (ref 65–99)
POTASSIUM: 3.6 mmol/L (ref 3.5–5.1)
SODIUM: 134 mmol/L — AB (ref 135–145)

## 2015-01-09 MED ORDER — SODIUM CHLORIDE 0.9 % IV SOLN
Freq: Once | INTRAVENOUS | Status: AC
  Administered 2015-01-09: 15:00:00 via INTRAVENOUS
  Filled 2015-01-09: qty 1000

## 2015-01-09 MED ORDER — HEPARIN SOD (PORK) LOCK FLUSH 100 UNIT/ML IV SOLN
500.0000 [IU] | Freq: Once | INTRAVENOUS | Status: AC
  Administered 2015-01-09: 500 [IU] via INTRAVENOUS
  Filled 2015-01-09: qty 5

## 2015-01-09 MED ORDER — ZOLEDRONIC ACID 4 MG/100ML IV SOLN
4.0000 mg | Freq: Once | INTRAVENOUS | Status: AC
  Administered 2015-01-09: 4 mg via INTRAVENOUS

## 2015-01-09 MED ORDER — SODIUM CHLORIDE 0.9 % IJ SOLN
10.0000 mL | INTRAMUSCULAR | Status: DC | PRN
  Administered 2015-01-09: 10 mL via INTRAVENOUS
  Filled 2015-01-09: qty 10

## 2015-01-10 LAB — PROTEIN ELECTROPHORESIS, SERUM
A/G Ratio: 1 (ref 0.7–1.7)
ALBUMIN ELP: 3.3 g/dL (ref 2.9–4.4)
Alpha-1-Globulin: 0.2 g/dL (ref 0.0–0.4)
Alpha-2-Globulin: 0.7 g/dL (ref 0.4–1.0)
BETA GLOBULIN: 1 g/dL (ref 0.7–1.3)
GAMMA GLOBULIN: 1.3 g/dL (ref 0.4–1.8)
Globulin, Total: 3.3 g/dL (ref 2.2–3.9)
M-SPIKE, %: 0.3 g/dL — AB
TOTAL PROTEIN ELP: 6.6 g/dL (ref 6.0–8.5)

## 2015-02-06 ENCOUNTER — Encounter: Payer: Self-pay | Admitting: Oncology

## 2015-02-06 ENCOUNTER — Inpatient Hospital Stay: Payer: Self-pay

## 2015-02-06 ENCOUNTER — Inpatient Hospital Stay: Payer: Self-pay | Attending: Oncology

## 2015-02-06 ENCOUNTER — Inpatient Hospital Stay (HOSPITAL_BASED_OUTPATIENT_CLINIC_OR_DEPARTMENT_OTHER): Payer: Self-pay | Admitting: Oncology

## 2015-02-06 VITALS — BP 129/85 | HR 87 | Temp 97.1°F | Resp 18 | Ht 60.0 in | Wt 124.8 lb

## 2015-02-06 DIAGNOSIS — C9001 Multiple myeloma in remission: Secondary | ICD-10-CM | POA: Insufficient documentation

## 2015-02-06 DIAGNOSIS — M1990 Unspecified osteoarthritis, unspecified site: Secondary | ICD-10-CM | POA: Insufficient documentation

## 2015-02-06 DIAGNOSIS — G8929 Other chronic pain: Secondary | ICD-10-CM

## 2015-02-06 DIAGNOSIS — M549 Dorsalgia, unspecified: Secondary | ICD-10-CM | POA: Insufficient documentation

## 2015-02-06 DIAGNOSIS — N289 Disorder of kidney and ureter, unspecified: Secondary | ICD-10-CM

## 2015-02-06 DIAGNOSIS — Z79899 Other long term (current) drug therapy: Secondary | ICD-10-CM | POA: Insufficient documentation

## 2015-02-06 DIAGNOSIS — I10 Essential (primary) hypertension: Secondary | ICD-10-CM

## 2015-02-06 LAB — BASIC METABOLIC PANEL
Anion gap: 9 (ref 5–15)
BUN: 20 mg/dL (ref 6–20)
CHLORIDE: 103 mmol/L (ref 101–111)
CO2: 23 mmol/L (ref 22–32)
CREATININE: 1.23 mg/dL — AB (ref 0.44–1.00)
Calcium: 8.9 mg/dL (ref 8.9–10.3)
GFR calc Af Amer: 58 mL/min — ABNORMAL LOW (ref >60)
GFR calc non Af Amer: 50 mL/min — ABNORMAL LOW (ref >60)
Glucose, Bld: 137 mg/dL — ABNORMAL HIGH (ref 65–99)
Potassium: 3.7 mmol/L (ref 3.5–5.1)
SODIUM: 135 mmol/L (ref 135–145)

## 2015-02-06 MED ORDER — ZOLEDRONIC ACID 4 MG/100ML IV SOLN
4.0000 mg | Freq: Once | INTRAVENOUS | Status: AC
  Administered 2015-02-06: 4 mg via INTRAVENOUS
  Filled 2015-02-06: qty 100

## 2015-02-06 MED ORDER — HEPARIN SOD (PORK) LOCK FLUSH 100 UNIT/ML IV SOLN
500.0000 [IU] | Freq: Once | INTRAVENOUS | Status: AC | PRN
  Administered 2015-02-06: 500 [IU]
  Filled 2015-02-06: qty 5

## 2015-02-06 MED ORDER — SODIUM CHLORIDE 0.9 % IV SOLN
Freq: Once | INTRAVENOUS | Status: AC
  Administered 2015-02-06: 16:00:00 via INTRAVENOUS
  Filled 2015-02-06: qty 1000

## 2015-02-06 MED ORDER — SODIUM CHLORIDE 0.9 % IJ SOLN
10.0000 mL | INTRAMUSCULAR | Status: DC | PRN
  Administered 2015-02-06 (×2): 10 mL
  Filled 2015-02-06: qty 10

## 2015-02-06 NOTE — Progress Notes (Signed)
Patient here for follow up for Multiple Myeloma. States she has numbness and tingling in her hands and feet.Reports the more she moves her hands, for example when she is knitting, the less tingling she notices.  Denies any pain today.

## 2015-02-21 NOTE — Progress Notes (Signed)
[facility] [place]  [phone] [phone]) [phone]  ID: [name] OB: 03-12-1965  MR#: [id number]  WLS#:[id number]  Patient Care Team: Denton Lank, MD as PCP - General (Family Medicine)  CHIEF COMPLAINT:  Chief Complaint  Patient presents with  . Multiple Myeloma    INTERVAL HISTORY: Patient returns to clinic today for further evaluation and continuation of Zometa. She continues to have chronic back pain which is unchanged. She otherwise feels well and is asymptomatic. She has no other neurologic complaints. She denies any recent fevers. She has a fair appetite. She denies any chest pain or shortness of breath.  She denies any nausea, vomiting, constipation, or diarrhea. She denies any other pain. She has no urinary complaints. Patient offers no further specific complaints today.  REVIEW OF SYSTEMS:   Review of Systems  Constitutional: Negative.   Respiratory: Negative.   Cardiovascular: Negative.   Musculoskeletal: Positive for back pain.    As per HPI. Otherwise, a complete review of systems is negatve.  PAST MEDICAL HISTORY: Past Medical History  Diagnosis Date  . Hypertension   . Osteoarthritis   . Multiple myeloma ([place])     PAST SURGICAL HISTORY: Past Surgical History  Procedure Laterality Date  . Cesarean section      x2    FAMILY HISTORY: Reviewed and unchanged. No reported history of malignancy or chronic disease.     ADVANCED DIRECTIVES:    HEALTH MAINTENANCE: Social History  Substance Use Topics  . Smoking status: Never Smoker   . Smokeless tobacco: Never Used  . Alcohol Use: No     Colonoscopy:  PAP:  Bone density:  Lipid panel:  Allergies  Allergen Reactions  . No Known Allergies     Current Outpatient Prescriptions  Medication Sig Dispense Refill  . glipiZIDE (GLUCOTROL) 5 MG tablet Take 5 mg by mouth.    . verapamil (CALAN) 80 MG tablet Take 80 mg by mouth.    . Vitamin D, Cholecalciferol, 400  UNITS CAPS Take 400 mg by mouth 2 (two) times daily.    Marland Kitchen zolpidem (AMBIEN) 10 MG tablet Take 1 tablet (10 mg total) by mouth at bedtime as needed for sleep. 30 tablet 2   No current facility-administered medications for this visit.    OBJECTIVE: Filed Vitals:   02/06/15 1425  BP: 129/85  Pulse: 87  Temp: 97.1 F (36.2 C)  Resp: 18     Body mass index is 24.37 kg/(m^2).    ECOG FS:0 - Asymptomatic  General: Well-developed, well-nourished, no acute distress. Eyes: anicteric sclera. Lungs: Clear to auscultation bilaterally. Heart: Regular rate and rhythm. No rubs, murmurs, or gallops. Abdomen: Soft, nontender, nondistended. No organomegaly noted, normoactive bowel sounds. Musculoskeletal: No edema, cyanosis, or clubbing. Neuro: Alert, answering all questions appropriately. Cranial nerves grossly intact. Skin: No rashes or petechiae noted. Psych: Normal affect.   LAB RESULTS:  Lab Results  Component Value Date   NA 135 02/06/2015   K 3.7 02/06/2015   CL 103 02/06/2015   CO2 23 02/06/2015   GLUCOSE 137* 02/06/2015   BUN 20 02/06/2015   CREATININE 1.23* 02/06/2015   CALCIUM 8.9 02/06/2015   PROT 6.9 04/15/2014   ALBUMIN 3.3* 04/15/2014   AST 22 04/15/2014   ALT 45 04/15/2014   ALKPHOS 103 04/15/2014   BILITOT 0.2 04/15/2014   GFRNONAA 50* 02/06/2015   GFRAA 58* 02/06/2015    Lab Results  Component Value Date   WBC 8.2 06/27/2014   NEUTROABS 6.1 06/27/2014  HGB 11.7* 06/27/2014   HCT 34.0* 06/27/2014   MCV 88 06/27/2014   PLT 267 06/27/2014     STUDIES: No results found.  ASSESSMENT: Multiple Myeloma with multiple lytic lesions.   PLAN:    1. Multiple Myeloma. Patient has had an excellent response to her treatments as creatinine has returned to normal and pain is grealty improved. Her M spike has trended up slightly to 0.3. Her creatinine is also slightly trending up. Patient completed maintenance Velcade on April 22, 2014.  Continue Zometa every 4  weeks.  Patient initiated Zometa in November 2015. Because of patient's immigration status, she cannot undergo transplant. Return to clinic in 4 weeks for Zometa only and then in 8 weeks for laboratory work, further evaluation, and continuation of Zometa only. Repeat SPEP at next lab draw. 2. Pain: Patient has now completed XRT.  3. Back pain: Patient has been evaluated by neurosurgery at [facility]. 4. Renal insufficiency: Creatinine mildly elevated today, continue to monitor given rising M spike.  The entire visit was done in the presence of an interpreter.  Patient expressed understanding and was in agreement with this plan. She also understands that She can call clinic at any time with any questions, concerns, or complaints.    Lloyd Huger, MD   02/21/2015 1:02 PM

## 2015-03-06 ENCOUNTER — Inpatient Hospital Stay: Payer: Self-pay

## 2015-03-06 DIAGNOSIS — C9001 Multiple myeloma in remission: Secondary | ICD-10-CM

## 2015-03-06 DIAGNOSIS — C9000 Multiple myeloma not having achieved remission: Secondary | ICD-10-CM

## 2015-03-06 LAB — BASIC METABOLIC PANEL
Anion gap: 6 (ref 5–15)
BUN: 26 mg/dL — AB (ref 6–20)
CALCIUM: 9 mg/dL (ref 8.9–10.3)
CHLORIDE: 107 mmol/L (ref 101–111)
CO2: 22 mmol/L (ref 22–32)
CREATININE: 0.75 mg/dL (ref 0.44–1.00)
GFR calc Af Amer: >60 mL/min (ref >60)
GFR calc non Af Amer: >60 mL/min (ref >60)
GLUCOSE: 110 mg/dL — AB (ref 65–99)
Potassium: 3.7 mmol/L (ref 3.5–5.1)
Sodium: 135 mmol/L (ref 135–145)

## 2015-03-06 LAB — CBC WITH DIFFERENTIAL/PLATELET
BASOS ABS: 0.1 10*3/uL (ref 0–0.1)
BASOS PCT: 1 %
Eosinophils Absolute: 0.1 10*3/uL (ref 0–0.7)
Eosinophils Relative: 1 %
HEMATOCRIT: 35.8 % (ref 35.0–47.0)
HEMOGLOBIN: 12.4 g/dL (ref 12.0–16.0)
LYMPHS PCT: 26 %
Lymphs Abs: 1.9 10*3/uL (ref 1.0–3.6)
MCH: 30.9 pg (ref 26.0–34.0)
MCHC: 34.6 g/dL (ref 32.0–36.0)
MCV: 89.5 fL (ref 80.0–100.0)
MONOS PCT: 11 %
Monocytes Absolute: 0.8 10*3/uL (ref 0.2–0.9)
NEUTROS ABS: 4.6 10*3/uL (ref 1.4–6.5)
NEUTROS PCT: 61 %
Platelets: 256 10*3/uL (ref 150–440)
RBC: 4 MIL/uL (ref 3.80–5.20)
RDW: 13.4 % (ref 11.5–14.5)
WBC: 7.4 10*3/uL (ref 3.6–11.0)

## 2015-03-06 MED ORDER — SODIUM CHLORIDE 0.9 % IV SOLN
Freq: Once | INTRAVENOUS | Status: AC
  Administered 2015-03-06: 15:00:00 via INTRAVENOUS
  Filled 2015-03-06: qty 1000

## 2015-03-06 MED ORDER — HEPARIN SOD (PORK) LOCK FLUSH 100 UNIT/ML IV SOLN
500.0000 [IU] | Freq: Once | INTRAVENOUS | Status: AC
  Administered 2015-03-06: 500 [IU] via INTRAVENOUS
  Filled 2015-03-06: qty 5

## 2015-03-06 MED ORDER — SODIUM CHLORIDE 0.9 % IJ SOLN
10.0000 mL | INTRAMUSCULAR | Status: DC | PRN
  Administered 2015-03-06: 10 mL via INTRAVENOUS
  Filled 2015-03-06: qty 10

## 2015-03-06 MED ORDER — ZOLEDRONIC ACID 4 MG/100ML IV SOLN
4.0000 mg | Freq: Once | INTRAVENOUS | Status: AC
  Administered 2015-03-06: 4 mg via INTRAVENOUS
  Filled 2015-03-06: qty 100

## 2015-03-07 LAB — PROTEIN ELECTROPHORESIS, SERUM
A/G RATIO SPE: 1 (ref 0.7–1.7)
ALBUMIN ELP: 3.6 g/dL (ref 2.9–4.4)
Alpha-1-Globulin: 0.3 g/dL (ref 0.0–0.4)
Alpha-2-Globulin: 0.7 g/dL (ref 0.4–1.0)
Beta Globulin: 1.1 g/dL (ref 0.7–1.3)
GLOBULIN, TOTAL: 3.5 g/dL (ref 2.2–3.9)
Gamma Globulin: 1.3 g/dL (ref 0.4–1.8)
M-Spike, %: 0.3 g/dL — ABNORMAL HIGH
TOTAL PROTEIN ELP: 7.1 g/dL (ref 6.0–8.5)

## 2015-04-03 ENCOUNTER — Inpatient Hospital Stay (HOSPITAL_BASED_OUTPATIENT_CLINIC_OR_DEPARTMENT_OTHER): Payer: Self-pay | Admitting: Oncology

## 2015-04-03 ENCOUNTER — Inpatient Hospital Stay: Payer: Self-pay | Attending: Oncology

## 2015-04-03 ENCOUNTER — Inpatient Hospital Stay: Payer: Self-pay

## 2015-04-03 VITALS — BP 126/81 | HR 77 | Temp 97.8°F | Resp 16 | Wt 125.9 lb

## 2015-04-03 DIAGNOSIS — I10 Essential (primary) hypertension: Secondary | ICD-10-CM

## 2015-04-03 DIAGNOSIS — Z7984 Long term (current) use of oral hypoglycemic drugs: Secondary | ICD-10-CM | POA: Insufficient documentation

## 2015-04-03 DIAGNOSIS — Z923 Personal history of irradiation: Secondary | ICD-10-CM | POA: Insufficient documentation

## 2015-04-03 DIAGNOSIS — M1990 Unspecified osteoarthritis, unspecified site: Secondary | ICD-10-CM

## 2015-04-03 DIAGNOSIS — C9000 Multiple myeloma not having achieved remission: Secondary | ICD-10-CM

## 2015-04-03 DIAGNOSIS — N2889 Other specified disorders of kidney and ureter: Secondary | ICD-10-CM

## 2015-04-03 DIAGNOSIS — Z79899 Other long term (current) drug therapy: Secondary | ICD-10-CM

## 2015-04-03 DIAGNOSIS — C9001 Multiple myeloma in remission: Secondary | ICD-10-CM

## 2015-04-03 LAB — BASIC METABOLIC PANEL
Anion gap: 7 (ref 5–15)
BUN: 20 mg/dL (ref 6–20)
CHLORIDE: 103 mmol/L (ref 101–111)
CO2: 23 mmol/L (ref 22–32)
Calcium: 8.4 mg/dL — ABNORMAL LOW (ref 8.9–10.3)
Creatinine, Ser: 0.89 mg/dL (ref 0.44–1.00)
GFR calc Af Amer: >60 mL/min (ref >60)
GFR calc non Af Amer: >60 mL/min (ref >60)
Glucose, Bld: 148 mg/dL — ABNORMAL HIGH (ref 65–99)
POTASSIUM: 3.5 mmol/L (ref 3.5–5.1)
SODIUM: 133 mmol/L — AB (ref 135–145)

## 2015-04-03 MED ORDER — SODIUM CHLORIDE 0.9% FLUSH
10.0000 mL | Freq: Once | INTRAVENOUS | Status: AC
  Administered 2015-04-03: 10 mL via INTRAVENOUS
  Filled 2015-04-03: qty 10

## 2015-04-03 MED ORDER — SODIUM CHLORIDE 0.9 % IV SOLN
Freq: Once | INTRAVENOUS | Status: AC
  Administered 2015-04-03: 15:00:00 via INTRAVENOUS
  Filled 2015-04-03: qty 1000

## 2015-04-03 MED ORDER — HEPARIN SOD (PORK) LOCK FLUSH 100 UNIT/ML IV SOLN
500.0000 [IU] | Freq: Once | INTRAVENOUS | Status: AC
  Administered 2015-04-03: 500 [IU] via INTRAVENOUS
  Filled 2015-04-03: qty 5

## 2015-04-03 MED ORDER — ZOLEDRONIC ACID 4 MG/100ML IV SOLN
4.0000 mg | Freq: Once | INTRAVENOUS | Status: AC
  Administered 2015-04-03: 4 mg via INTRAVENOUS
  Filled 2015-04-03: qty 100

## 2015-04-03 NOTE — Progress Notes (Signed)
[place]  [phone]) [phone] [phone]) [phone]  ID: Jamie Keller OB: 1965/07/29  MR#: [id number]  EOF#:[id number]  Patient Care Team: Jamie Lank, MD as PCP - General (Family Medicine)  CHIEF COMPLAINT:  Chief Complaint  Patient presents with  . Multiple Myeloma    INTERVAL HISTORY: Patient returns to clinic today for further evaluation and continuation of Zometa. She feels well and is asymptomatic. She does not complain of pain today. She has no other neurologic complaints. She denies any recent fevers. She has a fair appetite. She denies any chest pain or shortness of breath.  She denies any nausea, vomiting, constipation, or diarrhea. She denies any other pain. She has no urinary complaints. Patient offers no further specific complaints today.  REVIEW OF SYSTEMS:   Review of Systems  Constitutional: Negative.   Respiratory: Negative.   Cardiovascular: Negative.   Musculoskeletal: Negative.     As per HPI. Otherwise, a complete review of systems is negatve.  PAST MEDICAL HISTORY: Past Medical History  Diagnosis Date  . Hypertension   . Osteoarthritis   . Multiple myeloma ([place])     PAST SURGICAL HISTORY: Past Surgical History  Procedure Laterality Date  . Cesarean section      x2    FAMILY HISTORY: Reviewed and unchanged. No reported history of malignancy or chronic disease.     ADVANCED DIRECTIVES:    HEALTH MAINTENANCE: Social History  Substance Use Topics  . Smoking status: Never Smoker   . Smokeless tobacco: Never Used  . Alcohol Use: No     Colonoscopy:  PAP:  Bone density:  Lipid panel:  Allergies  Allergen Reactions  . No Known Allergies     Current Outpatient Prescriptions  Medication Sig Dispense Refill  . glipiZIDE (GLUCOTROL) 5 MG tablet Take 5 mg by mouth.    . verapamil (CALAN) 80 MG tablet Take 80 mg by mouth.    . Vitamin D, Cholecalciferol, 400 UNITS CAPS Take 400 mg by mouth 2 (two) times  daily. Reported on 04/03/2015    . zolpidem (AMBIEN) 10 MG tablet Take 1 tablet (10 mg total) by mouth at bedtime as needed for sleep. 30 tablet 2   No current facility-administered medications for this visit.   Facility-Administered Medications Ordered in Other Visits  Medication Dose Route Frequency Provider Last Rate Last Dose  . heparin lock flush 100 unit/mL  500 Units Intravenous Once Jamie Huger, MD        OBJECTIVE: Filed Vitals:   04/03/15 1410  BP: 126/81  Pulse: 77  Temp: 97.8 F (36.6 C)  Resp: 16     Body mass index is 24.58 kg/(m^2).    ECOG FS:0 - Asymptomatic  General: Well-developed, well-nourished, no acute distress. Eyes: anicteric sclera. Lungs: Clear to auscultation bilaterally. Heart: Regular rate and rhythm. No rubs, murmurs, or gallops. Abdomen: Soft, nontender, nondistended. No organomegaly noted, normoactive bowel sounds. Musculoskeletal: No edema, cyanosis, or clubbing. Neuro: Alert, answering all questions appropriately. Cranial nerves grossly intact. Skin: No rashes or petechiae noted. Psych: Normal affect.   LAB RESULTS:  Lab Results  Component Value Date   NA 133* 04/03/2015   K 3.5 04/03/2015   CL 103 04/03/2015   CO2 23 04/03/2015   GLUCOSE 148* 04/03/2015   BUN 20 04/03/2015   CREATININE 0.89 04/03/2015   CALCIUM 8.4* 04/03/2015   PROT 6.9 04/15/2014   ALBUMIN 3.3* 04/15/2014   AST 22 04/15/2014   ALT 45 04/15/2014   ALKPHOS 103  04/15/2014   BILITOT 0.2 04/15/2014   GFRNONAA >60 04/03/2015   GFRAA >60 04/03/2015    Lab Results  Component Value Date   WBC 7.4 03/06/2015   NEUTROABS 4.6 03/06/2015   HGB 12.4 03/06/2015   HCT 35.8 03/06/2015   MCV 89.5 03/06/2015   PLT 256 03/06/2015     STUDIES: No results found.  ASSESSMENT: Multiple Myeloma with multiple bony lytic lesions.   PLAN:    1. Multiple Myeloma. Patient has had an excellent response to her treatments as creatinine has returned to normal and pain  is grealty improved. Her M spike has trended up slightly to 0.3, but remains stable Patient completed maintenance Velcade on April 22, 2014.  Continue Zometa every 4 weeks.  Patient initiated Zometa in November 2015. Because of patient's immigration status, she cannot undergo transplant. Return to clinic in 4 weeks for Zometa only and then in 8 weeks for laboratory work, further evaluation, and continuation of Zometa only. Repeat SPEP at next lab draw. 2. Pain: Resolved. Patient has now completed XRT.  3. Back pain: Resolved. 4. Renal insufficiency: Creatinine WNL today, continue to monitor given rising M spike.  The entire visit was done in the presence of an interpreter.  Patient expressed understanding and was in agreement with this plan. She also understands that She can call clinic at any time with any questions, concerns, or complaints.    Jamie Reel, NP   04/03/2015 2:29 PM   Patient was seen and evaluated independently and I agree with the assessment and plan as dictated above.  Jamie Huger, MD 04/05/2015 2:23 PM

## 2015-04-03 NOTE — Progress Notes (Signed)
For the past 5 days patient is having headache, cough, nausea with abdominal pain.  The abdominal pain feels like a hunger pain then she will vomit.  Feeling very weak since symptoms stated.  Now having mid to upper back pain and can't sit for very long feels like her bones are weak.

## 2015-04-04 LAB — PROTEIN ELECTROPHORESIS, SERUM
A/G Ratio: 1 (ref 0.7–1.7)
ALBUMIN ELP: 3.2 g/dL (ref 2.9–4.4)
ALPHA-1-GLOBULIN: 0.2 g/dL (ref 0.0–0.4)
Alpha-2-Globulin: 0.8 g/dL (ref 0.4–1.0)
BETA GLOBULIN: 1 g/dL (ref 0.7–1.3)
GAMMA GLOBULIN: 1.3 g/dL (ref 0.4–1.8)
Globulin, Total: 3.3 g/dL (ref 2.2–3.9)
M-Spike, %: 0.3 g/dL — ABNORMAL HIGH
Total Protein ELP: 6.5 g/dL (ref 6.0–8.5)

## 2015-05-01 ENCOUNTER — Inpatient Hospital Stay: Payer: Self-pay | Attending: Oncology

## 2015-05-01 ENCOUNTER — Inpatient Hospital Stay: Payer: Self-pay

## 2015-05-01 VITALS — BP 131/78 | HR 80 | Temp 97.3°F | Resp 20

## 2015-05-01 DIAGNOSIS — C9001 Multiple myeloma in remission: Secondary | ICD-10-CM

## 2015-05-01 DIAGNOSIS — Z79899 Other long term (current) drug therapy: Secondary | ICD-10-CM | POA: Insufficient documentation

## 2015-05-01 DIAGNOSIS — C9000 Multiple myeloma not having achieved remission: Secondary | ICD-10-CM

## 2015-05-01 LAB — CBC WITH DIFFERENTIAL/PLATELET
BASOS ABS: 0 10*3/uL (ref 0–0.1)
Basophils Relative: 1 %
EOS ABS: 0.3 10*3/uL (ref 0–0.7)
EOS PCT: 4 %
HCT: 36.8 % (ref 35.0–47.0)
Hemoglobin: 12.9 g/dL (ref 12.0–16.0)
Lymphocytes Relative: 33 %
Lymphs Abs: 2.1 10*3/uL (ref 1.0–3.6)
MCH: 31.4 pg (ref 26.0–34.0)
MCHC: 35.2 g/dL (ref 32.0–36.0)
MCV: 89.3 fL (ref 80.0–100.0)
Monocytes Absolute: 0.7 10*3/uL (ref 0.2–0.9)
Monocytes Relative: 11 %
NEUTROS PCT: 51 %
Neutro Abs: 3.2 10*3/uL (ref 1.4–6.5)
PLATELETS: 270 10*3/uL (ref 150–440)
RBC: 4.12 MIL/uL (ref 3.80–5.20)
RDW: 13.3 % (ref 11.5–14.5)
WBC: 6.3 10*3/uL (ref 3.6–11.0)

## 2015-05-01 LAB — BASIC METABOLIC PANEL
Anion gap: 8 (ref 5–15)
BUN: 21 mg/dL — AB (ref 6–20)
CHLORIDE: 106 mmol/L (ref 101–111)
CO2: 22 mmol/L (ref 22–32)
CREATININE: 1.14 mg/dL — AB (ref 0.44–1.00)
Calcium: 9 mg/dL (ref 8.9–10.3)
GFR, EST NON AFRICAN AMERICAN: 55 mL/min — AB (ref >60)
Glucose, Bld: 113 mg/dL — ABNORMAL HIGH (ref 65–99)
POTASSIUM: 3.4 mmol/L — AB (ref 3.5–5.1)
SODIUM: 136 mmol/L (ref 135–145)

## 2015-05-01 MED ORDER — HEPARIN SOD (PORK) LOCK FLUSH 100 UNIT/ML IV SOLN
500.0000 [IU] | Freq: Once | INTRAVENOUS | Status: AC | PRN
  Administered 2015-05-01: 500 [IU]
  Filled 2015-05-01: qty 5

## 2015-05-01 MED ORDER — ZOLEDRONIC ACID 4 MG/100ML IV SOLN
4.0000 mg | Freq: Once | INTRAVENOUS | Status: AC
  Administered 2015-05-01: 4 mg via INTRAVENOUS
  Filled 2015-05-01: qty 100

## 2015-05-01 MED ORDER — SODIUM CHLORIDE 0.9 % IV SOLN
Freq: Once | INTRAVENOUS | Status: AC
  Administered 2015-05-01: 15:00:00 via INTRAVENOUS
  Filled 2015-05-01: qty 1000

## 2015-05-01 MED ORDER — SODIUM CHLORIDE 0.9 % IJ SOLN
10.0000 mL | INTRAMUSCULAR | Status: DC | PRN
  Administered 2015-05-01: 10 mL
  Filled 2015-05-01: qty 10

## 2015-05-02 LAB — PROTEIN ELECTROPHORESIS, SERUM
A/G Ratio: 0.9 (ref 0.7–1.7)
ALBUMIN ELP: 3.4 g/dL (ref 2.9–4.4)
ALPHA-1-GLOBULIN: 0.2 g/dL (ref 0.0–0.4)
Alpha-2-Globulin: 0.8 g/dL (ref 0.4–1.0)
BETA GLOBULIN: 1.1 g/dL (ref 0.7–1.3)
GAMMA GLOBULIN: 1.4 g/dL (ref 0.4–1.8)
Globulin, Total: 3.6 g/dL (ref 2.2–3.9)
M-Spike, %: 0.4 g/dL — ABNORMAL HIGH
Total Protein ELP: 7 g/dL (ref 6.0–8.5)

## 2015-05-14 ENCOUNTER — Other Ambulatory Visit: Payer: Self-pay | Admitting: Family Medicine

## 2015-05-14 DIAGNOSIS — M25552 Pain in left hip: Secondary | ICD-10-CM

## 2015-05-29 ENCOUNTER — Inpatient Hospital Stay: Payer: Self-pay

## 2015-05-29 ENCOUNTER — Inpatient Hospital Stay: Payer: Self-pay | Attending: Oncology

## 2015-05-29 ENCOUNTER — Inpatient Hospital Stay (HOSPITAL_BASED_OUTPATIENT_CLINIC_OR_DEPARTMENT_OTHER): Payer: Self-pay | Admitting: Oncology

## 2015-05-29 VITALS — BP 151/83 | HR 81 | Temp 98.8°F | Resp 16 | Wt 125.7 lb

## 2015-05-29 DIAGNOSIS — C9000 Multiple myeloma not having achieved remission: Secondary | ICD-10-CM | POA: Insufficient documentation

## 2015-05-29 DIAGNOSIS — C9001 Multiple myeloma in remission: Secondary | ICD-10-CM

## 2015-05-29 DIAGNOSIS — I10 Essential (primary) hypertension: Secondary | ICD-10-CM

## 2015-05-29 DIAGNOSIS — Z79899 Other long term (current) drug therapy: Secondary | ICD-10-CM | POA: Insufficient documentation

## 2015-05-29 DIAGNOSIS — M549 Dorsalgia, unspecified: Secondary | ICD-10-CM

## 2015-05-29 DIAGNOSIS — M1990 Unspecified osteoarthritis, unspecified site: Secondary | ICD-10-CM

## 2015-05-29 DIAGNOSIS — Z7984 Long term (current) use of oral hypoglycemic drugs: Secondary | ICD-10-CM

## 2015-05-29 DIAGNOSIS — N289 Disorder of kidney and ureter, unspecified: Secondary | ICD-10-CM

## 2015-05-29 LAB — BASIC METABOLIC PANEL
Anion gap: 6 (ref 5–15)
BUN: 20 mg/dL (ref 6–20)
CHLORIDE: 106 mmol/L (ref 101–111)
CO2: 24 mmol/L (ref 22–32)
CREATININE: 0.88 mg/dL (ref 0.44–1.00)
Calcium: 9.3 mg/dL (ref 8.9–10.3)
GFR calc Af Amer: >60 mL/min (ref >60)
GFR calc non Af Amer: >60 mL/min (ref >60)
GLUCOSE: 96 mg/dL (ref 65–99)
POTASSIUM: 3.6 mmol/L (ref 3.5–5.1)
Sodium: 136 mmol/L (ref 135–145)

## 2015-05-29 MED ORDER — HEPARIN SOD (PORK) LOCK FLUSH 100 UNIT/ML IV SOLN
500.0000 [IU] | Freq: Once | INTRAVENOUS | Status: AC
  Administered 2015-05-29: 500 [IU] via INTRAVENOUS
  Filled 2015-05-29: qty 5

## 2015-05-29 MED ORDER — ZOLEDRONIC ACID 4 MG/100ML IV SOLN
4.0000 mg | Freq: Once | INTRAVENOUS | Status: AC
  Administered 2015-05-29: 4 mg via INTRAVENOUS
  Filled 2015-05-29: qty 100

## 2015-05-29 MED ORDER — SODIUM CHLORIDE 0.9% FLUSH
10.0000 mL | INTRAVENOUS | Status: DC | PRN
  Filled 2015-05-29: qty 10

## 2015-05-29 MED ORDER — SODIUM CHLORIDE 0.9 % IV SOLN
Freq: Once | INTRAVENOUS | Status: AC
  Administered 2015-05-29: 15:00:00 via INTRAVENOUS
  Filled 2015-05-29: qty 1000

## 2015-05-29 NOTE — Progress Notes (Signed)
[place]  Telephone:(336) [phone] Fax:(336) [phone]  ID: Jamie Keller OB: Aug 06, 1965  MR#: [id number]  JGG#:[id number]  Patient Care Team: Denton Lank, MD as PCP - General (Family Medicine)  CHIEF COMPLAINT:  Chief Complaint  Patient presents with  . Multiple Myeloma    INTERVAL HISTORY: Patient returns to clinic today for further evaluation and continuation of Zometa. She feels well and is asymptomatic. She does complain of back pain today. She has no other neurologic complaints. She denies any recent fevers. She has a fair appetite. She denies any chest pain or shortness of breath.  She denies any nausea, vomiting, constipation, or diarrhea. She denies any other pain. She has no urinary complaints. Patient offers no further specific complaints today.  REVIEW OF SYSTEMS:   Review of Systems  Constitutional: Negative.   Respiratory: Negative.   Cardiovascular: Negative.   Musculoskeletal: Positive for back pain.    As per HPI. Otherwise, a complete review of systems is negatve.  PAST MEDICAL HISTORY: Past Medical History  Diagnosis Date  . Hypertension   . Osteoarthritis   . Multiple myeloma ([place])     PAST SURGICAL HISTORY: Past Surgical History  Procedure Laterality Date  . Cesarean section      x2    FAMILY HISTORY: Reviewed and unchanged. No reported history of malignancy or chronic disease.     ADVANCED DIRECTIVES:    HEALTH MAINTENANCE: Social History  Substance Use Topics  . Smoking status: Never Smoker   . Smokeless tobacco: Never Used  . Alcohol Use: No     Colonoscopy:  PAP:  Bone density:  Lipid panel:  Allergies  Allergen Reactions  . No Known Allergies     Current Outpatient Prescriptions  Medication Sig Dispense Refill  . atropine 1 % ophthalmic solution TAKE 1 DROP(S) IN RIGHT EYE 4 TIMES A DAY  0  . glipiZIDE (GLUCOTROL) 5 MG tablet Take 5 mg by mouth.    . prednisoLONE acetate (PRED FORTE) 1 %  ophthalmic suspension TAKE 1 DROP(S) IN LEFT EYE EVERY HOUR WHILE AWAKE  2  . verapamil (CALAN) 80 MG tablet Take 80 mg by mouth.    . Vitamin D, Cholecalciferol, 400 UNITS CAPS Take 400 mg by mouth 2 (two) times daily. Reported on 04/03/2015    . zolpidem (AMBIEN) 10 MG tablet Take 1 tablet (10 mg total) by mouth at bedtime as needed for sleep. 30 tablet 2   No current facility-administered medications for this visit.   Facility-Administered Medications Ordered in Other Visits  Medication Dose Route Frequency Provider Last Rate Last Dose  . heparin lock flush 100 unit/mL  500 Units Intravenous Once Lloyd Huger, MD      . sodium chloride flush (NS) 0.9 % injection 10 mL  10 mL Intravenous PRN Lloyd Huger, MD        OBJECTIVE: Filed Vitals:   05/29/15 1348  BP: 151/83  Pulse: 81  Temp: 98.8 F (37.1 C)  Resp: 16     Body mass index is 24.54 kg/(m^2).    ECOG FS:0 - Asymptomatic  General: Well-developed, well-nourished, no acute distress. Eyes: anicteric sclera. Lungs: Clear to auscultation bilaterally. Heart: Regular rate and rhythm. No rubs, murmurs, or gallops. Abdomen: Soft, nontender, nondistended. No organomegaly noted, normoactive bowel sounds. Musculoskeletal: No edema, cyanosis, or clubbing. Neuro: Alert, answering all questions appropriately. Cranial nerves grossly intact. Skin: No rashes or petechiae noted. Psych: Normal affect.   LAB RESULTS:  Lab Results  Component Value Date   NA 136 05/29/2015   K 3.6 05/29/2015   CL 106 05/29/2015   CO2 24 05/29/2015   GLUCOSE 96 05/29/2015   BUN 20 05/29/2015   CREATININE 0.88 05/29/2015   CALCIUM 9.3 05/29/2015   PROT 6.9 04/15/2014   ALBUMIN 3.3* 04/15/2014   AST 22 04/15/2014   ALT 45 04/15/2014   ALKPHOS 103 04/15/2014   BILITOT 0.2 04/15/2014   GFRNONAA >60 05/29/2015   GFRAA >60 05/29/2015    Lab Results  Component Value Date   WBC 6.3 05/01/2015   NEUTROABS 3.2 05/01/2015   HGB 12.9  05/01/2015   HCT 36.8 05/01/2015   MCV 89.3 05/01/2015   PLT 270 05/01/2015   Lab Results  Component Value Date   TOTALPROTELP 7.0 05/01/2015   ALBUMINELP 3.4 05/01/2015   A1GS 0.2 05/01/2015   A2GS 0.8 05/01/2015   BETS 1.1 05/01/2015   GAMS 1.4 05/01/2015   MSPIKE 0.4* 05/01/2015   SPEI Comment 05/01/2015     STUDIES: No results found.  ASSESSMENT: Multiple Myeloma with multiple bony lytic lesions.   PLAN:    1. Multiple Myeloma. Patient has had excellent response to treatment, her M spike has trended up slightly to 0.4, but remains stable. Patient completed maintenance Velcade on April 22, 2014. Patient initiated Zometa in November 2015. Because of patient's immigration status, she cannot undergo transplant. Proceed with Zometa today. Return to clinic in 5 weeks for Zometa only (patient is going on a trip and requested to be delayed one week) and then in 9 weeks for laboratory work, further evaluation, and continuation of Zometa only.   2. Back pain:  Etiology unclear.  Monitor, will consider repeat MRI if pain worsens.. She, patient was evaluated by neurosurgery. 3. Renal insufficiency: Creatinine WNL today, continue to monitor given rising M spike.  The entire visit was done in the presence of an interpreter.  Patient expressed understanding and was in agreement with this plan. She also understands that She can call clinic at any time with any questions, concerns, or complaints.    [name], NP   05/29/2015 2:30 PM    Patient was seen and evaluated independently and I agree with the assessment and plan as dictated above.  Lloyd Huger, MD 05/30/2015 6:13 AM

## 2015-05-29 NOTE — Progress Notes (Signed)
Patient has an itching sensation on left side of back that started as a burning feeling for the past 2 months.  She was evaluated by her PCP who advised her to discuss with Dr. Grayland Ormond.  Still has the chronic low back pain.

## 2015-05-30 LAB — PROTEIN ELECTROPHORESIS, SERUM
A/G RATIO SPE: 1.1 (ref 0.7–1.7)
ALBUMIN ELP: 3.5 g/dL (ref 2.9–4.4)
ALPHA-2-GLOBULIN: 0.8 g/dL (ref 0.4–1.0)
Alpha-1-Globulin: 0.2 g/dL (ref 0.0–0.4)
BETA GLOBULIN: 1 g/dL (ref 0.7–1.3)
Gamma Globulin: 1.3 g/dL (ref 0.4–1.8)
Globulin, Total: 3.2 g/dL (ref 2.2–3.9)
M-Spike, %: 0.3 g/dL — ABNORMAL HIGH
Total Protein ELP: 6.7 g/dL (ref 6.0–8.5)

## 2015-06-26 ENCOUNTER — Telehealth: Payer: Self-pay

## 2015-06-26 NOTE — Telephone Encounter (Signed)
Jamie Keller received a call from patient stating that she is having the same abdominal pain and nausea that she was having at time of initial diagnosis.  She is concerned and would like to know if she should come see Dr. Grayland Ormond for evaluation.

## 2015-06-26 NOTE — Telephone Encounter (Signed)
Left message on Jackie's voicemail informing MD advice and to call back if patient has any further questions.

## 2015-06-26 NOTE — Telephone Encounter (Signed)
Her m-spike is unchanged for several months.  I doubt this is a progression.  Recommend seeing her PCP first.  Thanks!

## 2015-07-03 ENCOUNTER — Ambulatory Visit: Payer: Self-pay

## 2015-07-10 ENCOUNTER — Inpatient Hospital Stay: Payer: Self-pay | Attending: Oncology

## 2015-07-10 VITALS — BP 148/74 | HR 116 | Temp 97.1°F | Resp 18

## 2015-07-10 DIAGNOSIS — N289 Disorder of kidney and ureter, unspecified: Secondary | ICD-10-CM | POA: Insufficient documentation

## 2015-07-10 DIAGNOSIS — R109 Unspecified abdominal pain: Secondary | ICD-10-CM | POA: Insufficient documentation

## 2015-07-10 DIAGNOSIS — Z79899 Other long term (current) drug therapy: Secondary | ICD-10-CM | POA: Insufficient documentation

## 2015-07-10 DIAGNOSIS — M5440 Lumbago with sciatica, unspecified side: Secondary | ICD-10-CM | POA: Insufficient documentation

## 2015-07-10 DIAGNOSIS — Z7984 Long term (current) use of oral hypoglycemic drugs: Secondary | ICD-10-CM | POA: Insufficient documentation

## 2015-07-10 DIAGNOSIS — M1990 Unspecified osteoarthritis, unspecified site: Secondary | ICD-10-CM | POA: Insufficient documentation

## 2015-07-10 DIAGNOSIS — C801 Malignant (primary) neoplasm, unspecified: Secondary | ICD-10-CM

## 2015-07-10 DIAGNOSIS — I10 Essential (primary) hypertension: Secondary | ICD-10-CM | POA: Insufficient documentation

## 2015-07-10 DIAGNOSIS — C9001 Multiple myeloma in remission: Secondary | ICD-10-CM

## 2015-07-10 DIAGNOSIS — R531 Weakness: Secondary | ICD-10-CM | POA: Insufficient documentation

## 2015-07-10 DIAGNOSIS — R202 Paresthesia of skin: Secondary | ICD-10-CM | POA: Insufficient documentation

## 2015-07-10 DIAGNOSIS — C9000 Multiple myeloma not having achieved remission: Secondary | ICD-10-CM | POA: Insufficient documentation

## 2015-07-10 DIAGNOSIS — R5383 Other fatigue: Secondary | ICD-10-CM | POA: Insufficient documentation

## 2015-07-10 LAB — BASIC METABOLIC PANEL
Anion gap: 9 (ref 5–15)
BUN: 35 mg/dL — ABNORMAL HIGH (ref 6–20)
CO2: 21 mmol/L — AB (ref 22–32)
Calcium: 9.7 mg/dL (ref 8.9–10.3)
Chloride: 108 mmol/L (ref 101–111)
Creatinine, Ser: 1.09 mg/dL — ABNORMAL HIGH (ref 0.44–1.00)
GFR calc Af Amer: >60 mL/min (ref >60)
GFR calc non Af Amer: 58 mL/min — ABNORMAL LOW (ref >60)
GLUCOSE: 124 mg/dL — AB (ref 65–99)
POTASSIUM: 3.5 mmol/L (ref 3.5–5.1)
SODIUM: 138 mmol/L (ref 135–145)

## 2015-07-10 MED ORDER — ZOLEDRONIC ACID 4 MG/100ML IV SOLN
4.0000 mg | Freq: Once | INTRAVENOUS | Status: AC
  Administered 2015-07-10: 4 mg via INTRAVENOUS
  Filled 2015-07-10: qty 100

## 2015-07-10 MED ORDER — HEPARIN SOD (PORK) LOCK FLUSH 100 UNIT/ML IV SOLN
500.0000 [IU] | Freq: Once | INTRAVENOUS | Status: AC
  Administered 2015-07-10: 500 [IU] via INTRAVENOUS

## 2015-07-10 MED ORDER — HEPARIN SOD (PORK) LOCK FLUSH 100 UNIT/ML IV SOLN
INTRAVENOUS | Status: AC
  Filled 2015-07-10: qty 5

## 2015-07-10 MED ORDER — SODIUM CHLORIDE 0.9 % IV SOLN
Freq: Once | INTRAVENOUS | Status: AC
  Administered 2015-07-10: 20 mL/h via INTRAVENOUS
  Filled 2015-07-10: qty 1000

## 2015-07-10 MED ORDER — SODIUM CHLORIDE 0.9% FLUSH
10.0000 mL | Freq: Once | INTRAVENOUS | Status: AC
  Administered 2015-07-10: 10 mL via INTRAVENOUS
  Filled 2015-07-10: qty 10

## 2015-07-11 LAB — PROTEIN ELECTROPHORESIS, SERUM
A/G Ratio: 1 (ref 0.7–1.7)
ALPHA-1-GLOBULIN: 0.3 g/dL (ref 0.0–0.4)
Albumin ELP: 3.6 g/dL (ref 2.9–4.4)
Alpha-2-Globulin: 0.9 g/dL (ref 0.4–1.0)
Beta Globulin: 1.4 g/dL — ABNORMAL HIGH (ref 0.7–1.3)
GAMMA GLOBULIN: 1.1 g/dL (ref 0.4–1.8)
Globulin, Total: 3.5 g/dL (ref 2.2–3.9)
M-SPIKE, %: 0.4 g/dL — AB
TOTAL PROTEIN ELP: 7.1 g/dL (ref 6.0–8.5)

## 2015-07-31 ENCOUNTER — Inpatient Hospital Stay (HOSPITAL_BASED_OUTPATIENT_CLINIC_OR_DEPARTMENT_OTHER): Payer: Self-pay | Admitting: Oncology

## 2015-07-31 ENCOUNTER — Inpatient Hospital Stay: Payer: Self-pay

## 2015-07-31 VITALS — BP 125/71 | HR 97 | Resp 18 | Wt 121.7 lb

## 2015-07-31 DIAGNOSIS — C9001 Multiple myeloma in remission: Secondary | ICD-10-CM

## 2015-07-31 DIAGNOSIS — Z7984 Long term (current) use of oral hypoglycemic drugs: Secondary | ICD-10-CM

## 2015-07-31 DIAGNOSIS — C9000 Multiple myeloma not having achieved remission: Secondary | ICD-10-CM

## 2015-07-31 DIAGNOSIS — M5440 Lumbago with sciatica, unspecified side: Secondary | ICD-10-CM

## 2015-07-31 DIAGNOSIS — R531 Weakness: Secondary | ICD-10-CM

## 2015-07-31 DIAGNOSIS — R109 Unspecified abdominal pain: Secondary | ICD-10-CM

## 2015-07-31 DIAGNOSIS — R202 Paresthesia of skin: Secondary | ICD-10-CM

## 2015-07-31 DIAGNOSIS — M1990 Unspecified osteoarthritis, unspecified site: Secondary | ICD-10-CM

## 2015-07-31 DIAGNOSIS — R5383 Other fatigue: Secondary | ICD-10-CM

## 2015-07-31 DIAGNOSIS — I10 Essential (primary) hypertension: Secondary | ICD-10-CM

## 2015-07-31 DIAGNOSIS — N289 Disorder of kidney and ureter, unspecified: Secondary | ICD-10-CM

## 2015-07-31 DIAGNOSIS — Z79899 Other long term (current) drug therapy: Secondary | ICD-10-CM

## 2015-07-31 LAB — CBC WITH DIFFERENTIAL/PLATELET
BASOS ABS: 0 10*3/uL (ref 0–0.1)
Basophils Relative: 0 %
EOS PCT: 1 %
Eosinophils Absolute: 0.1 10*3/uL (ref 0–0.7)
HEMATOCRIT: 28.6 % — AB (ref 35.0–47.0)
Hemoglobin: 10 g/dL — ABNORMAL LOW (ref 12.0–16.0)
LYMPHS PCT: 28 %
Lymphs Abs: 2.2 10*3/uL (ref 1.0–3.6)
MCH: 31.6 pg (ref 26.0–34.0)
MCHC: 35 g/dL (ref 32.0–36.0)
MCV: 90.2 fL (ref 80.0–100.0)
Monocytes Absolute: 1 10*3/uL — ABNORMAL HIGH (ref 0.2–0.9)
Monocytes Relative: 13 %
NEUTROS ABS: 4.4 10*3/uL (ref 1.4–6.5)
NEUTROS PCT: 58 %
PLATELETS: 215 10*3/uL (ref 150–440)
RBC: 3.17 MIL/uL — ABNORMAL LOW (ref 3.80–5.20)
RDW: 14 % (ref 11.5–14.5)
WBC: 7.7 10*3/uL (ref 3.6–11.0)

## 2015-07-31 LAB — BASIC METABOLIC PANEL
Anion gap: 7 (ref 5–15)
BUN: 37 mg/dL — AB (ref 6–20)
CO2: 21 mmol/L — ABNORMAL LOW (ref 22–32)
CREATININE: 1.19 mg/dL — AB (ref 0.44–1.00)
Calcium: 9.6 mg/dL (ref 8.9–10.3)
Chloride: 111 mmol/L (ref 101–111)
GFR calc Af Amer: >60 mL/min (ref >60)
GFR, EST NON AFRICAN AMERICAN: 52 mL/min — AB (ref >60)
Glucose, Bld: 126 mg/dL — ABNORMAL HIGH (ref 65–99)
Potassium: 3.8 mmol/L (ref 3.5–5.1)
SODIUM: 139 mmol/L (ref 135–145)

## 2015-07-31 MED ORDER — SODIUM CHLORIDE 0.9 % IV SOLN
Freq: Once | INTRAVENOUS | Status: AC
  Administered 2015-07-31: 16:00:00 via INTRAVENOUS
  Filled 2015-07-31: qty 1000

## 2015-07-31 MED ORDER — SODIUM CHLORIDE 0.9% FLUSH
10.0000 mL | INTRAVENOUS | Status: DC | PRN
  Administered 2015-07-31: 10 mL via INTRAVENOUS
  Filled 2015-07-31: qty 10

## 2015-07-31 MED ORDER — HEPARIN SOD (PORK) LOCK FLUSH 100 UNIT/ML IV SOLN
500.0000 [IU] | Freq: Once | INTRAVENOUS | Status: AC | PRN
  Administered 2015-07-31: 500 [IU]
  Filled 2015-07-31: qty 5

## 2015-07-31 MED ORDER — ZOLEDRONIC ACID 4 MG/100ML IV SOLN
4.0000 mg | Freq: Once | INTRAVENOUS | Status: AC
  Administered 2015-07-31: 4 mg via INTRAVENOUS
  Filled 2015-07-31: qty 100

## 2015-07-31 NOTE — Progress Notes (Signed)
[place]  [phone] Fax:(336) [phone]  ID: Jamie Keller OB: Mar 25, 1965  MR#: [id number]  JAS#:[id number]  Patient Care Team: Denton Lank, MD as PCP - General (Family Medicine)  CHIEF COMPLAINT:  Chief Complaint  Patient presents with  . Multiple Myeloma    INTERVAL HISTORY: Patient returns to clinic today for further evaluation and continuation of Zometa. She has multiple complaints today.  She has pain in her mid back that is wrapping around to her abdomen on both sides and goes down her legs. She has bilateral leg cramps in both calves and feet. She has tingling in her lower lip and chin.  She denies any recent fevers. She has a fair appetite. She denies any chest pain or shortness of breath.  She denies any nausea, vomiting, constipation, or diarrhea. She denies any other pain. She has no urinary complaints. Patient offers no further specific complaints today.  REVIEW OF SYSTEMS:   Review of Systems  Constitutional: Positive for malaise/fatigue.  Respiratory: Negative.   Cardiovascular: Negative.   Gastrointestinal: Positive for abdominal pain.  Musculoskeletal: Positive for back pain.  Neurological: Positive for tingling and weakness.       Tingling under her lower lip/chin    As per HPI. Otherwise, a complete review of systems is negatve.  PAST MEDICAL HISTORY: Past Medical History  Diagnosis Date  . Hypertension   . Osteoarthritis   . Multiple myeloma ([place])     PAST SURGICAL HISTORY: Past Surgical History  Procedure Laterality Date  . Cesarean section      x2    FAMILY HISTORY: Reviewed and unchanged. No reported history of malignancy or chronic disease.     ADVANCED DIRECTIVES:    HEALTH MAINTENANCE: Social History  Substance Use Topics  . Smoking status: Never Smoker   . Smokeless tobacco: Never Used  . Alcohol Use: No     Allergies  Allergen Reactions  . No Known Allergies     Current Outpatient  Prescriptions  Medication Sig Dispense Refill  . glipiZIDE (GLUCOTROL) 5 MG tablet Take 5 mg by mouth.    . verapamil (CALAN) 80 MG tablet Take 80 mg by mouth.    Marland Kitchen atropine 1 % ophthalmic solution Reported on 07/31/2015  0  . prednisoLONE acetate (PRED FORTE) 1 % ophthalmic suspension Reported on 07/31/2015  2  . Vitamin D, Cholecalciferol, 400 UNITS CAPS Take 400 mg by mouth 2 (two) times daily. Reported on 07/31/2015     No current facility-administered medications for this visit.   Facility-Administered Medications Ordered in Other Visits  Medication Dose Route Frequency Provider Last Rate Last Dose  . sodium chloride flush (NS) 0.9 % injection 10 mL  10 mL Intravenous PRN Lloyd Huger, MD   10 mL at 07/31/15 1419    OBJECTIVE: Filed Vitals:   07/31/15 1438  BP: 125/71  Pulse: 97  Resp: 18     Body mass index is 23.77 kg/(m^2).    ECOG FS:0 - Asymptomatic  General: Well-developed, well-nourished, no acute distress. Eyes: anicteric sclera. Lungs: Clear to auscultation bilaterally. Heart: Regular rate and rhythm. No rubs, murmurs, or gallops. Abdomen: Soft, nontender, nondistended. No organomegaly noted, normoactive bowel sounds. Musculoskeletal: No edema, cyanosis, or clubbing. Neuro: Alert, answering all questions appropriately. Cranial nerves grossly intact. Skin: No rashes or petechiae noted. Psych: Normal affect.   LAB RESULTS:  Lab Results  Component Value Date   NA 139 07/31/2015   K 3.8 07/31/2015   CL 111  07/31/2015   CO2 21* 07/31/2015   GLUCOSE 126* 07/31/2015   BUN 37* 07/31/2015   CREATININE 1.19* 07/31/2015   CALCIUM 9.6 07/31/2015   PROT 6.9 04/15/2014   ALBUMIN 3.3* 04/15/2014   AST 22 04/15/2014   ALT 45 04/15/2014   ALKPHOS 103 04/15/2014   BILITOT 0.2 04/15/2014   GFRNONAA 52* 07/31/2015   GFRAA >60 07/31/2015    Lab Results  Component Value Date   WBC 7.7 07/31/2015   NEUTROABS 4.4 07/31/2015   HGB 10.0* 07/31/2015   HCT 28.6*  07/31/2015   MCV 90.2 07/31/2015   PLT 215 07/31/2015   Lab Results  Component Value Date   TOTALPROTELP 7.1 07/10/2015   ALBUMINELP 3.6 07/10/2015   A1GS 0.3 07/10/2015   A2GS 0.9 07/10/2015   BETS 1.4* 07/10/2015   GAMS 1.1 07/10/2015   MSPIKE 0.4* 07/10/2015   SPEI Comment 07/10/2015     STUDIES: No results found.  ASSESSMENT: Multiple Myeloma with multiple bony lytic lesions.   PLAN:    1. Multiple Myeloma. Patient has had excellent response to treatment, her M spike has trended up slightly to 0.4, but remains essentially stable. Patient completed maintenance Velcade on April 22, 2014. Patient initiated Zometa in November 2015. Because of patient's immigration status, she could not undergo transplant. Proceed with Zometa today. Return to clinic in 4 weeks for Zometa and then in 8 weeks for laboratory work, further evaluation, and continuation of Zometa.   2. Back pain:  Etiology unclear.  Repeat MRI, will call patient with results. May need referral back to neurosurgery. 3. Renal insufficiency: Creatinine up to 1.19 today, continue to monitor given rising M spike. 4.Tingling in lip: Unclear etiology. Monitor.  The entire visit was done in the presence of an interpreter.  Patient expressed understanding and was in agreement with this plan. She also understands that She can call clinic at any time with any questions, concerns, or complaints.    [name], NP   07/31/2015 3:23 PM   Patient was seen and evaluated independently and I agree with the assessment and plan as dictated above.  Lloyd Huger, MD 08/01/2015 4:05 PM

## 2015-07-31 NOTE — Progress Notes (Signed)
Patient is having more bad days than good days.  A bad day consist of low back pain that radiates down bilateral hips that is 7/10 on pain scale.  Also has episodes of muscle cramps in her legs and hands.  Has epigastric pain that is relieved by eating a few bites of food.  Has a tingling and swollen sensation on her lower lip for the past week.

## 2015-08-01 LAB — PROTEIN ELECTROPHORESIS, SERUM
A/G RATIO SPE: 0.9 (ref 0.7–1.7)
Albumin ELP: 3.3 g/dL (ref 2.9–4.4)
Alpha-1-Globulin: 0.3 g/dL (ref 0.0–0.4)
Alpha-2-Globulin: 1 g/dL (ref 0.4–1.0)
BETA GLOBULIN: 1.3 g/dL (ref 0.7–1.3)
GAMMA GLOBULIN: 1 g/dL (ref 0.4–1.8)
Globulin, Total: 3.5 g/dL (ref 2.2–3.9)
M-Spike, %: 0.4 g/dL — ABNORMAL HIGH
Total Protein ELP: 6.8 g/dL (ref 6.0–8.5)

## 2015-08-12 ENCOUNTER — Ambulatory Visit
Admission: RE | Admit: 2015-08-12 | Discharge: 2015-08-12 | Disposition: A | Discharge status: Home / Self Care | Payer: Self-pay | Source: Ambulatory Visit | Attending: Oncology | Admitting: Oncology

## 2015-08-12 DIAGNOSIS — M5440 Lumbago with sciatica, unspecified side: Secondary | ICD-10-CM

## 2015-08-12 DIAGNOSIS — M4856XA Collapsed vertebra, not elsewhere classified, lumbar region, initial encounter for fracture: Secondary | ICD-10-CM | POA: Insufficient documentation

## 2015-08-12 DIAGNOSIS — M4854XA Collapsed vertebra, not elsewhere classified, thoracic region, initial encounter for fracture: Secondary | ICD-10-CM | POA: Insufficient documentation

## 2015-08-12 DIAGNOSIS — M545 Low back pain: Secondary | ICD-10-CM | POA: Insufficient documentation

## 2015-08-12 MED ORDER — GADOBENATE DIMEGLUMINE 529 MG/ML IV SOLN
10.0000 mL | Freq: Once | INTRAVENOUS | Status: AC | PRN
  Administered 2015-08-12: 10 mL via INTRAVENOUS

## 2015-08-15 ENCOUNTER — Encounter: Payer: Self-pay | Admitting: *Deleted

## 2015-08-15 NOTE — Progress Notes (Signed)
Dr. Grayland Ormond aware of MRI results, states patient needs to be seen next week. Patient to be scheduled to see Dr. Grayland Ormond and Dr. Baruch Gouty, scheduling to notify patient of appointment.

## 2015-08-21 ENCOUNTER — Encounter: Payer: Self-pay | Admitting: Radiation Oncology

## 2015-08-21 ENCOUNTER — Ambulatory Visit
Admission: RE | Admit: 2015-08-21 | Discharge: 2015-08-21 | Disposition: A | Discharge status: Home / Self Care | Payer: Self-pay | Source: Ambulatory Visit | Attending: Radiation Oncology | Admitting: Radiation Oncology

## 2015-08-21 ENCOUNTER — Inpatient Hospital Stay: Payer: Self-pay | Attending: Oncology | Admitting: Oncology

## 2015-08-21 VITALS — BP 123/63 | HR 109 | Temp 96.7°F | Wt 121.4 lb

## 2015-08-21 DIAGNOSIS — Z8781 Personal history of (healed) traumatic fracture: Secondary | ICD-10-CM | POA: Insufficient documentation

## 2015-08-21 DIAGNOSIS — C9000 Multiple myeloma not having achieved remission: Secondary | ICD-10-CM | POA: Insufficient documentation

## 2015-08-21 DIAGNOSIS — Z79899 Other long term (current) drug therapy: Secondary | ICD-10-CM | POA: Insufficient documentation

## 2015-08-21 DIAGNOSIS — M1990 Unspecified osteoarthritis, unspecified site: Secondary | ICD-10-CM | POA: Insufficient documentation

## 2015-08-21 DIAGNOSIS — M549 Dorsalgia, unspecified: Secondary | ICD-10-CM | POA: Insufficient documentation

## 2015-08-21 DIAGNOSIS — H838X9 Other specified diseases of inner ear, unspecified ear: Secondary | ICD-10-CM | POA: Insufficient documentation

## 2015-08-21 DIAGNOSIS — D649 Anemia, unspecified: Secondary | ICD-10-CM | POA: Insufficient documentation

## 2015-08-21 DIAGNOSIS — N289 Disorder of kidney and ureter, unspecified: Secondary | ICD-10-CM | POA: Insufficient documentation

## 2015-08-21 DIAGNOSIS — K760 Fatty (change of) liver, not elsewhere classified: Secondary | ICD-10-CM | POA: Insufficient documentation

## 2015-08-21 DIAGNOSIS — R5383 Other fatigue: Secondary | ICD-10-CM | POA: Insufficient documentation

## 2015-08-21 DIAGNOSIS — R531 Weakness: Secondary | ICD-10-CM | POA: Insufficient documentation

## 2015-08-21 DIAGNOSIS — C9002 Multiple myeloma in relapse: Secondary | ICD-10-CM

## 2015-08-21 DIAGNOSIS — Z510 Encounter for antineoplastic radiation therapy: Secondary | ICD-10-CM | POA: Insufficient documentation

## 2015-08-21 DIAGNOSIS — Z7984 Long term (current) use of oral hypoglycemic drugs: Secondary | ICD-10-CM | POA: Insufficient documentation

## 2015-08-21 DIAGNOSIS — I2510 Atherosclerotic heart disease of native coronary artery without angina pectoris: Secondary | ICD-10-CM | POA: Insufficient documentation

## 2015-08-21 DIAGNOSIS — R252 Cramp and spasm: Secondary | ICD-10-CM | POA: Insufficient documentation

## 2015-08-21 DIAGNOSIS — I10 Essential (primary) hypertension: Secondary | ICD-10-CM | POA: Insufficient documentation

## 2015-08-21 NOTE — Progress Notes (Signed)
Pt has questions regarding how the doctors know when her disease is progressing and also would like referral to [place] [facility] for transportation issues. Message has been sent to [place].

## 2015-08-21 NOTE — Progress Notes (Signed)
Radiation Oncology Follow up Note  Name: Jamie Keller   Date:   08/21/2015 MRN:  [id number] DOB: 1966/02/16    This 50 y.o. female presents to the clinic today for reevaluation of his progression of multiple myeloma within the lower thoracic lumbar spine.  REFERRING PROVIDER: Denton Lank, MD  HPI: Patient is a 51 year old Spanish speaking female well-known to department having been treated little less than 2 years prior for multiple myeloma involvement of her thoracic lumbar spine. She receives rather extensive field from T10 through the SI joints 3000 cGy in 15 fractions which she tolerated well and had a good pain response. She has recently had. Repeat MRI scans showing marked progression of multiple myeloma with a left paraspinal mass at T10-T11 measuring approximate 4 x 2.5 x 3 cm. There is also spread of tumor posteriorly and T12 L2 and L3. I been asked to evaluate the patient for increasing pain tightness in her lower back. [name] having no motor or sensory level. She has currently been on Zometa. [name] also complaining of some ear congestion when she moves her head in a certain position not sure the etiology of that. She is ambulating well without assistance.  COMPLICATIONS OF TREATMENT: none  FOLLOW UP COMPLIANCE: keeps appointments   PHYSICAL EXAM:  BP 123/63 mmHg  Pulse 109  Temp(Src) 96.7 F (35.9 C)  Wt 121 lb 5.8 oz (55.05 kg) Motor sensory and DTR levels are equal and symmetric in the lower extremities. Range of motion of her lower extreme is does not elicit pain deep palpation of her lower spine does not elicit pain. Well-developed well-nourished patient in NAD. HEENT reveals PERLA, EOMI, discs not visualized.  Oral cavity is clear. No oral mucosal lesions are identified. Neck is clear without evidence of cervical or supraclavicular adenopathy. Lungs are clear to A&P. Cardiac examination is essentially unremarkable with regular rate and rhythm without murmur rub or  thrill. Abdomen is benign with no organomegaly or masses noted. Motor sensory and DTR levels are equal and symmetric in the upper and lower extremities. Cranial nerves II through XII are grossly intact. Proprioception is intact. No peripheral adenopathy or edema is identified. No motor or sensory levels are noted. Crude visual fields are within normal range.  RADIOLOGY RESULTS: MRI scans are reviewed  PLAN: At this time I discussed the case personally with medical oncology. I believe I can offer another limited course of palliative radiation therapy to her spine without approaching spinal cord toxicity. I believe 2000 cGy in 10 fractions might help alleviate some of her pain. Other treatment options including further chemotherapy will be discussed with medical oncology. I have personally set up and her ordered CT simulation for early next week. Risks and benefits of treatment including possible diarrhea, fatigue skin reaction alteration of blood counts all were described in detail. Interpreter was present throughout are consultation.  I would like to take this opportunity to thank you for allowing me to participate in the care of your patient.Armstead Peaks., MD

## 2015-08-26 ENCOUNTER — Ambulatory Visit
Admission: RE | Admit: 2015-08-26 | Discharge: 2015-08-26 | Disposition: A | Discharge status: Home / Self Care | Payer: Self-pay | Source: Ambulatory Visit | Attending: Radiation Oncology | Admitting: Radiation Oncology

## 2015-08-29 ENCOUNTER — Encounter: Payer: Self-pay | Admitting: *Deleted

## 2015-08-29 DIAGNOSIS — Z780 Asymptomatic menopausal state: Secondary | ICD-10-CM | POA: Insufficient documentation

## 2015-09-01 ENCOUNTER — Other Ambulatory Visit: Payer: Self-pay

## 2015-09-01 ENCOUNTER — Ambulatory Visit: Payer: Self-pay

## 2015-09-02 ENCOUNTER — Ambulatory Visit: Admission: RE | Admit: 2015-09-02 | Payer: Self-pay | Source: Ambulatory Visit

## 2015-09-02 ENCOUNTER — Encounter
Admission: RE | Admit: 2015-09-02 | Discharge: 2015-09-02 | Disposition: A | Discharge status: Home / Self Care | Payer: Self-pay | Source: Ambulatory Visit | Attending: Oncology | Admitting: Oncology

## 2015-09-02 ENCOUNTER — Ambulatory Visit
Admission: RE | Admit: 2015-09-02 | Discharge: 2015-09-02 | Disposition: A | Discharge status: Home / Self Care | Payer: Self-pay | Source: Ambulatory Visit | Attending: Oncology | Admitting: Oncology

## 2015-09-02 ENCOUNTER — Ambulatory Visit: Payer: Self-pay

## 2015-09-02 DIAGNOSIS — M488X4 Other specified spondylopathies, thoracic region: Secondary | ICD-10-CM | POA: Insufficient documentation

## 2015-09-02 DIAGNOSIS — K760 Fatty (change of) liver, not elsewhere classified: Secondary | ICD-10-CM | POA: Insufficient documentation

## 2015-09-02 DIAGNOSIS — C9002 Multiple myeloma in relapse: Secondary | ICD-10-CM | POA: Insufficient documentation

## 2015-09-02 DIAGNOSIS — I700 Atherosclerosis of aorta: Secondary | ICD-10-CM | POA: Insufficient documentation

## 2015-09-02 MED ORDER — TECHNETIUM TC 99M MEDRONATE IV KIT
25.0000 | PACK | Freq: Once | INTRAVENOUS | Status: AC | PRN
  Administered 2015-09-02: 21.44 via INTRAVENOUS

## 2015-09-02 MED ORDER — IOPAMIDOL (ISOVUE-300) INJECTION 61%
100.0000 mL | Freq: Once | INTRAVENOUS | Status: AC | PRN
  Administered 2015-09-02: 100 mL via INTRAVENOUS

## 2015-09-03 ENCOUNTER — Other Ambulatory Visit: Payer: Self-pay | Admitting: *Deleted

## 2015-09-03 ENCOUNTER — Ambulatory Visit
Admission: RE | Admit: 2015-09-03 | Discharge: 2015-09-03 | Disposition: A | Discharge status: Home / Self Care | Payer: Self-pay | Source: Ambulatory Visit | Attending: Radiation Oncology | Admitting: Radiation Oncology

## 2015-09-03 MED ORDER — PROMETHAZINE HCL 25 MG PO TABS: 25.0000 mg | ORAL_TABLET | Freq: Four times a day (QID) | ORAL | Status: DC | PRN

## 2015-09-03 MED ORDER — SUCRALFATE 1 G PO TABS: 1.0000 g | ORAL_TABLET | Freq: Two times a day (BID) | ORAL | Status: DC

## 2015-09-04 ENCOUNTER — Ambulatory Visit
Admission: RE | Admit: 2015-09-04 | Discharge: 2015-09-04 | Disposition: A | Discharge status: Home / Self Care | Payer: Self-pay | Source: Ambulatory Visit | Attending: Radiation Oncology | Admitting: Radiation Oncology

## 2015-09-04 ENCOUNTER — Ambulatory Visit: Payer: Self-pay

## 2015-09-05 ENCOUNTER — Ambulatory Visit
Admission: RE | Admit: 2015-09-05 | Discharge: 2015-09-05 | Disposition: A | Discharge status: Home / Self Care | Payer: Self-pay | Source: Ambulatory Visit | Attending: Radiation Oncology | Admitting: Radiation Oncology

## 2015-09-06 MED ORDER — ONDANSETRON HCL 8 MG PO TABS: 8.0000 mg | ORAL_TABLET | Freq: Two times a day (BID) | ORAL | Status: DC | PRN

## 2015-09-06 MED ORDER — PROCHLORPERAZINE MALEATE 10 MG PO TABS: 10.0000 mg | ORAL_TABLET | Freq: Four times a day (QID) | ORAL | Status: DC | PRN

## 2015-09-06 MED ORDER — LIDOCAINE-PRILOCAINE 2.5-2.5 % EX CREA: TOPICAL_CREAM | CUTANEOUS | Status: DC

## 2015-09-06 NOTE — Progress Notes (Signed)
[place]  [phone] [phone]  ID: [name] OB: [birth year]  MR#: [id number]  FXT#:[id number]  [name], MD as PCP - General (Family Medicine)  CHIEF COMPLAINT: Recurrent multiple myeloma.  INTERVAL HISTORY: Patient returns to clinic today for further evaluation, discussion of her MRI results, and treatment planning. She continues to have significant back pain that is wrapping around to her abdomen on both sides and goes down her legs. She has bilateral leg cramps in both calves and feet. She denies any recent fevers. She has a fair appetite. She denies any chest pain or shortness of breath.  She denies any nausea, vomiting, constipation, or diarrhea. She denies any other pain. She has no urinary complaints. Patient offers no further specific complaints today.  REVIEW OF SYSTEMS:   Review of Systems  Constitutional: Positive for malaise/fatigue. Negative for fever and weight loss.  Respiratory: Negative.  Negative for cough and shortness of breath.   Cardiovascular: Negative.  Negative for chest pain.  Gastrointestinal: Positive for abdominal pain.  Musculoskeletal: Positive for back pain.  Neurological: Positive for weakness. Negative for tingling and focal weakness.  Psychiatric/Behavioral: The patient is nervous/anxious.     As per HPI. Otherwise, a complete review of systems is negatve.  PAST MEDICAL HISTORY: Past Medical History  Diagnosis Date  . Hypertension   . Osteoarthritis   . Multiple myeloma ([place])   . Post-menopausal 2014    PAST SURGICAL HISTORY: Past Surgical History  Procedure Laterality Date  . Cesarean section      x2    FAMILY HISTORY: Reviewed and unchanged. No reported history of malignancy or chronic disease.     ADVANCED DIRECTIVES:    HEALTH MAINTENANCE: Social History  Substance Use Topics  . Smoking status: Never Smoker   . Smokeless tobacco: Never  Used  . Alcohol Use: No     Allergies  Allergen Reactions  . No Known Allergies     Current Outpatient Prescriptions  Medication Sig Dispense Refill  . atropine 1 % ophthalmic solution Reported on 07/31/2015  0  . glipiZIDE (GLUCOTROL) 5 MG tablet Take 5 mg by mouth.    . prednisoLONE acetate (PRED FORTE) 1 % ophthalmic suspension Reported on 07/31/2015  2  . promethazine (PHENERGAN) 25 MG tablet Take 1 tablet (25 mg total) by mouth every 6 (six) hours as needed for nausea or vomiting. 60 tablet 0  . sucralfate (CARAFATE) 1 g tablet Take 1 tablet (1 g total) by mouth 2 (two) times daily. 60 tablet 3  . verapamil (CALAN) 80 MG tablet Take 80 mg by mouth.    . Vitamin D, Cholecalciferol, 400 UNITS CAPS Take 400 mg by mouth 2 (two) times daily. Reported on 07/31/2015     No current facility-administered medications for this visit.    OBJECTIVE: There were no vitals filed for this visit.   There is no weight on file to calculate BMI.    ECOG FS:0 - Asymptomatic  General: Well-developed, well-nourished, no acute distress. Eyes: anicteric sclera. Lungs: Clear to auscultation bilaterally. Heart: Regular rate and rhythm. No rubs, murmurs, or gallops. Abdomen: Soft, nontender, nondistended. No organomegaly noted, normoactive bowel sounds. Musculoskeletal: No edema, cyanosis, or clubbing. Neuro: Alert, answering all questions appropriately. Cranial nerves grossly intact. Skin: No rashes or petechiae noted. Psych: Normal affect.   LAB RESULTS:  Lab Results  Component Value Date   NA 139 07/31/2015   K 3.8 07/31/2015   CL 111  07/31/2015   CO2 21* 07/31/2015   GLUCOSE 126* 07/31/2015   BUN 37* 07/31/2015   CREATININE 1.19* 07/31/2015   CALCIUM 9.6 07/31/2015   PROT 6.9 04/15/2014   ALBUMIN 3.3* 04/15/2014   AST 22 04/15/2014   ALT 45 04/15/2014   ALKPHOS 103 04/15/2014   BILITOT 0.2 04/15/2014   GFRNONAA 52* 07/31/2015   GFRAA >60 07/31/2015    Lab Results  Component Value  Date   WBC 7.7 07/31/2015   NEUTROABS 4.4 07/31/2015   HGB 10.0* 07/31/2015   HCT 28.6* 07/31/2015   MCV 90.2 07/31/2015   PLT 215 07/31/2015   Lab Results  Component Value Date   TOTALPROTELP 6.8 07/31/2015   ALBUMINELP 3.3 07/31/2015   A1GS 0.3 07/31/2015   A2GS 1.0 07/31/2015   BETS 1.3 07/31/2015   GAMS 1.0 07/31/2015   MSPIKE 0.4* 07/31/2015   SPEI Comment 07/31/2015     STUDIES: Ct Chest W Contrast  09/02/2015  CLINICAL DATA:  50 year old female presents for restaging of multiple myeloma. EXAM: CT CHEST, ABDOMEN, AND PELVIS WITH CONTRAST TECHNIQUE: Multidetector CT imaging of the chest, abdomen and pelvis was performed following the standard protocol during bolus administration of intravenous contrast. CONTRAST:  177m ISOVUE-300 IOPAMIDOL (ISOVUE-300) INJECTION 61% COMPARISON:  08/07/2013 CT chest, abdomen and pelvis. FINDINGS: CT CHEST Mediastinum/Nodes: Normal heart size. No significant pericardial fluid/thickening. Right internal jugular MediPort terminates in the right atrium near the cavoatrial junction. Great vessels are normal in course and caliber. No central pulmonary emboli. No discrete thyroid nodules. Unremarkable esophagus. No pathologically enlarged axillary, mediastinal or hilar lymph nodes. Lungs/Pleura: No pneumothorax. No pleural effusion. Calcified subcentimeter right lower lobe granuloma. Subsegmental scarring versus atelectasis in the lingula. No acute consolidative airspace disease or significant pulmonary nodules. Mild mosaic attenuation throughout both lungs. Musculoskeletal: There is patchy confluent mixed lytic and sclerotic change throughout the entire thoracic skeleton, with interval increased sclerosis throughout the bones compared to 08/07/2013. Stable mild chronic T11 vertebral compression fracture. Numerous healed deformities in the bilateral ribs. There is an infiltrative left paraspinal 3.8 x 3.7 x 5.3 cm mass (series 2/ image 36) involving the T9-T11  levels with involvement of the T9-10 left neural foramen and left T9 spinal canal. CT ABDOMEN AND PELVIS Hepatobiliary: Normal liver size. Diffuse hepatic steatosis. No liver mass. Normal gallbladder with no radiopaque cholelithiasis. No biliary ductal dilatation. Pancreas: Normal, with no mass or duct dilation. Spleen: Normal size. No mass. Adrenals/Urinary Tract: Normal adrenals. Normal kidneys with no hydronephrosis and no renal mass. Normal bladder. Stomach/Bowel: Grossly normal stomach. Normal caliber small bowel with no small bowel wall thickening. Normal appendix. Normal large bowel with no diverticulosis, large bowel wall thickening or pericolonic fat stranding. Vascular/Lymphatic: Atherosclerotic nonaneurysmal abdominal aorta. Patent portal, splenic, hepatic and renal veins. No pathologically enlarged lymph nodes in the abdomen or pelvis. Reproductive: Grossly normal uterus.  No adnexal mass. Other: No pneumoperitoneum, ascites or focal fluid collection. Musculoskeletal: There is patchy confluent mixed lytic and sclerotic change throughout the entire abdominopelvic skeleton, with increased sclerosis throughout the bones compared to 08/07/2013. IMPRESSION: 1. 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 2. Patchy confluent mixed lytic and sclerotic change throughout the entire skeleton, consistent with diffuse myeloma, with increased sclerosis throughout the bones compared to the 08/07/2013 CT study, which could represent interval treatment effect. 3. Stable  mild chronic T11 vertebral compression fracture. Numerous healed deformities in the bilateral ribs. 4. No lymphadenopathy in the chest, abdomen or pelvis. 5. Aortic atherosclerosis. 6. Diffuse hepatic steatosis. Electronically Signed   By: Ilona Sorrel M.D.    On: 09/02/2015 12:13   Mr Lumbar Spine W Wo Contrast  08/12/2015  CLINICAL DATA:  50 year old female with multiple myeloma. Low back pain and bilateral hip pain. Initial encounter. EXAM: MRI LUMBAR SPINE WITHOUT AND WITH CONTRAST TECHNIQUE: Multiplanar and multiecho pulse sequences of the lumbar spine were obtained without and with intravenous contrast. CONTRAST:  97m MULTIHANCE GADOBENATE DIMEGLUMINE 529 MG/ML IV SOLN COMPARISON:  12/24/2013. FINDINGS: Segmentation: Last fully open disk space is labeled L5-S1. Present examination incorporates from T10 through S3. Alignment:  Straightening of the lower thoracic and lumbar spine. Vertebrae: Marked progression of diffuse bone marrow abnormality consistent with involvement by myeloma. Remote T11 compression fracture. Inferior T11 Schmorl's node deformity has progressed. Remote inferior L1 compression fracture stable. Remote L5 superior endplate Schmorl's node deformity to the right of midline stable. Conus medullaris: Extends to the L1 level and appears normal. Lipoma extends from the L2 level into the sacrum maximal transverse dimension of 3 mm. Paraspinal and other soft tissues: Left paraspinal soft tissue mass extends from the T10 to the T11 level consistent with spread of tumor. Extension to left T10-11 neural foramen. Disc levels: T10-11: 4 x 2.5 x 3 cm left paraspinal soft tissue mass partially extends into the left T10-11 neural foramen consistent with spread of tumor. T11-12:  Mild posterior element hypertrophy.  Minimal bulge. T12: Spread of tumor ventral aspect of the canal greatest mid T12 level with slight flattening of the thecal sac. T12-L1: Minimal Schmorl's node deformity superior endplate L1. Minimal left facet bony overgrowth. L1-2: Mild facet bony overgrowth greater on left. Minimal indentation left post lateral thecal L2: Spread of tumor ventral aspect the canal greatest mid L2 level with mild flattening of the thecal sac greater on the right.  L2-3:  Minimal facet bony overgrowth. L3: Spread of tumor ventral aspect the canal with slight flattening of thecal sac. L3-4: Bulge. Facet degenerative changes. Short pedicles. Dorsal epidural fat. Mild multifactorial thecal sac narrowing. L4-5: Moderate bulge. Facet degenerative changes. Short pedicles. Ligamentum flavum hypertrophy. Multifactorial mild to slightly moderate spinal stenosis and bilateral lateral recess stenosis. L5-S1: Bulge with minimal caudal extension. Minimal contact upper S1 nerve roots. Facet degenerative changes. Diffuse bone marrow replacement of the ilium bilaterally and sacrum consistent with myeloma involvement. IMPRESSION: Marked change since prior examination with diffuse bone marrow involvement by myeloma of all visualized osseous structures T10 through the mid to lower sacrum (including ilium bilaterally). Left paraspinal 4 x 2.5 x 3 cm soft tissue mass T10 - T11 level with slight extension into the left T10-11 neural foramen consistent with extraosseous spread of tumor. Spread of tumor posterior to the T12, L2 and L3 vertebral body contributing to slight narrowing ventral aspect of the canal. Degenerative changes most prominent L4-5 level as detailed above. Remote T11 compression fracture. Inferior T11 Schmorl's node deformity has progressed. Remote inferior L1 compression fracture stable. Remote L5 superior endplate Schmorl's node deformity to the right of midline stable. These results will be called to the ordering clinician or representative by the Radiologist Assistant, and communication documented in the PACS or zVision Dashboard. Electronically Signed   By: SGenia DelM.D.   On: 08/12/2015 09:31   Nm Bone Scan Whole Body  09/02/2015  CLINICAL DATA: Multiple myeloma. EXAM: NUCLEAR  MEDICINE WHOLE BODY BONE SCAN TECHNIQUE: Whole body anterior and posterior images were obtained approximately 3 hours after intravenous injection of radiopharmaceutical. RADIOPHARMACEUTICALS:   21.4 mCi Technetium-67mMDP IV COMPARISON:  CT 09/02/2015.  MRI 08/12/2015 . FINDINGS: The bilateral renal function excretion is present. Multiple focal areas of increased activity noted about the skull, thoracic spine, ribs, pelvis, right humerus, sternum. These changes may be related to myeloma however metastatic disease cannot be excluded . IMPRESSION: Multifocal areas of increased activity. These changes may be related to the patient's known myeloma however metastatic disease could also present in this fashion. Electronically Signed   By: TMarcello Moores Register   On: 09/02/2015 14:02   Ct Abdomen Pelvis W Contrast  09/02/2015  CLINICAL DATA:  50year old female presents for restaging of multiple myeloma. EXAM: CT CHEST, ABDOMEN, AND PELVIS WITH CONTRAST TECHNIQUE: Multidetector CT imaging of the chest, abdomen and pelvis was performed following the standard protocol during bolus administration of intravenous contrast. CONTRAST:  1050mISOVUE-300 IOPAMIDOL (ISOVUE-300) INJECTION 61% COMPARISON:  08/07/2013 CT chest, abdomen and pelvis. FINDINGS: CT CHEST Mediastinum/Nodes: Normal heart size. No significant pericardial fluid/thickening. Right internal jugular MediPort terminates in the right atrium near the cavoatrial junction. Great vessels are normal in course and caliber. No central pulmonary emboli. No discrete thyroid nodules. Unremarkable esophagus. No pathologically enlarged axillary, mediastinal or hilar lymph nodes. Lungs/Pleura: No pneumothorax. No pleural effusion. Calcified subcentimeter right lower lobe granuloma. Subsegmental scarring versus atelectasis in the lingula. No acute consolidative airspace disease or significant pulmonary nodules. Mild mosaic attenuation throughout both lungs. Musculoskeletal: There is patchy confluent mixed lytic and sclerotic change throughout the entire thoracic skeleton, with interval increased sclerosis throughout the bones compared to 08/07/2013. Stable mild chronic T11  vertebral compression fracture. Numerous healed deformities in the bilateral ribs. There is an infiltrative left paraspinal 3.8 x 3.7 x 5.3 cm mass (series 2/ image 36) involving the T9-T11 levels with involvement of the T9-10 left neural foramen and left T9 spinal canal. CT ABDOMEN AND PELVIS Hepatobiliary: Normal liver size. Diffuse hepatic steatosis. No liver mass. Normal gallbladder with no radiopaque cholelithiasis. No biliary ductal dilatation. Pancreas: Normal, with no mass or duct dilation. Spleen: Normal size. No mass. Adrenals/Urinary Tract: Normal adrenals. Normal kidneys with no hydronephrosis and no renal mass. Normal bladder. Stomach/Bowel: Grossly normal stomach. Normal caliber small bowel with no small bowel wall thickening. Normal appendix. Normal large bowel with no diverticulosis, large bowel wall thickening or pericolonic fat stranding. Vascular/Lymphatic: Atherosclerotic nonaneurysmal abdominal aorta. Patent portal, splenic, hepatic and renal veins. No pathologically enlarged lymph nodes in the abdomen or pelvis. Reproductive: Grossly normal uterus.  No adnexal mass. Other: No pneumoperitoneum, ascites or focal fluid collection. Musculoskeletal: There is patchy confluent mixed lytic and sclerotic change throughout the entire abdominopelvic skeleton, with increased sclerosis throughout the bones compared to 08/07/2013. IMPRESSION: 1. 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 2. Patchy confluent mixed lytic and sclerotic change throughout the entire skeleton, consistent with diffuse myeloma, with increased sclerosis throughout the bones compared to the 08/07/2013 CT study, which could represent interval treatment effect. 3. Stable mild chronic T11 vertebral compression fracture. Numerous healed  deformities in the bilateral ribs. 4. No lymphadenopathy in the chest, abdomen or pelvis. 5. Aortic atherosclerosis. 6. Diffuse hepatic steatosis. Electronically Signed   By: JaJanina Mayo.  On: 09/02/2015 12:13    ASSESSMENT: Multiple Myeloma with multiple bony lytic lesions.   PLAN:    1. Multiple Myeloma. MRI, CT, bone scan results reviewed independently indicating progression of disease. Patient's M spike has trended up slightly to 0.4. Patient has an appointment for radiation oncology for XRT to her lumbar spine. She will return to clinic on September 15, 2015 to initiate single agent Velcade on days 1, 4, 8, and 11. Will also continue Zometa every 4 weeks. Because of patient's immigration status, she could not undergo transplant.  2. Back pain:  Secondary to progression of disease, referral has been made to radiation oncology.  3. Renal insufficiency: Creatinine up to 1.19 today, continue to monitor given rising M spike. 4. Anemia: Monitor.  The entire visit was done in the presence of an interpreter.  Patient expressed understanding and was in agreement with this plan. She also understands that She can call clinic at any time with any questions, concerns, or complaints.    Lloyd Huger, MD   09/06/2015 6:09 PM

## 2015-09-08 ENCOUNTER — Ambulatory Visit
Admission: RE | Admit: 2015-09-08 | Discharge: 2015-09-08 | Disposition: A | Discharge status: Home / Self Care | Payer: Self-pay | Source: Ambulatory Visit | Attending: Radiation Oncology | Admitting: Radiation Oncology

## 2015-09-10 ENCOUNTER — Inpatient Hospital Stay: Payer: Self-pay | Attending: Oncology

## 2015-09-10 ENCOUNTER — Ambulatory Visit
Admission: RE | Admit: 2015-09-10 | Discharge: 2015-09-10 | Disposition: A | Discharge status: Home / Self Care | Payer: Self-pay | Source: Ambulatory Visit | Attending: Radiation Oncology | Admitting: Radiation Oncology

## 2015-09-10 DIAGNOSIS — Z79899 Other long term (current) drug therapy: Secondary | ICD-10-CM | POA: Insufficient documentation

## 2015-09-10 DIAGNOSIS — M549 Dorsalgia, unspecified: Secondary | ICD-10-CM | POA: Insufficient documentation

## 2015-09-10 DIAGNOSIS — C9002 Multiple myeloma in relapse: Secondary | ICD-10-CM | POA: Insufficient documentation

## 2015-09-10 DIAGNOSIS — R112 Nausea with vomiting, unspecified: Secondary | ICD-10-CM | POA: Insufficient documentation

## 2015-09-10 DIAGNOSIS — N2889 Other specified disorders of kidney and ureter: Secondary | ICD-10-CM | POA: Insufficient documentation

## 2015-09-10 DIAGNOSIS — R739 Hyperglycemia, unspecified: Secondary | ICD-10-CM | POA: Insufficient documentation

## 2015-09-10 DIAGNOSIS — R531 Weakness: Secondary | ICD-10-CM | POA: Insufficient documentation

## 2015-09-10 DIAGNOSIS — F329 Major depressive disorder, single episode, unspecified: Secondary | ICD-10-CM | POA: Insufficient documentation

## 2015-09-10 DIAGNOSIS — R5383 Other fatigue: Secondary | ICD-10-CM | POA: Insufficient documentation

## 2015-09-10 DIAGNOSIS — H578 Other specified disorders of eye and adnexa: Secondary | ICD-10-CM | POA: Insufficient documentation

## 2015-09-10 DIAGNOSIS — Z5111 Encounter for antineoplastic chemotherapy: Secondary | ICD-10-CM | POA: Insufficient documentation

## 2015-09-10 DIAGNOSIS — D649 Anemia, unspecified: Secondary | ICD-10-CM | POA: Insufficient documentation

## 2015-09-10 DIAGNOSIS — E119 Type 2 diabetes mellitus without complications: Secondary | ICD-10-CM | POA: Insufficient documentation

## 2015-09-10 DIAGNOSIS — M1990 Unspecified osteoarthritis, unspecified site: Secondary | ICD-10-CM | POA: Insufficient documentation

## 2015-09-11 ENCOUNTER — Ambulatory Visit
Admission: RE | Admit: 2015-09-11 | Discharge: 2015-09-11 | Disposition: A | Discharge status: Home / Self Care | Payer: Self-pay | Source: Ambulatory Visit | Attending: Radiation Oncology | Admitting: Radiation Oncology

## 2015-09-11 ENCOUNTER — Ambulatory Visit: Payer: Self-pay

## 2015-09-11 ENCOUNTER — Inpatient Hospital Stay: Payer: Self-pay

## 2015-09-12 ENCOUNTER — Inpatient Hospital Stay: Payer: Self-pay

## 2015-09-12 ENCOUNTER — Ambulatory Visit
Admission: RE | Admit: 2015-09-12 | Discharge: 2015-09-12 | Disposition: A | Discharge status: Home / Self Care | Payer: Self-pay | Source: Ambulatory Visit | Attending: Radiation Oncology | Admitting: Radiation Oncology

## 2015-09-15 ENCOUNTER — Ambulatory Visit
Admission: RE | Admit: 2015-09-15 | Discharge: 2015-09-15 | Disposition: A | Discharge status: Home / Self Care | Payer: Self-pay | Source: Ambulatory Visit | Attending: Radiation Oncology | Admitting: Radiation Oncology

## 2015-09-15 ENCOUNTER — Inpatient Hospital Stay (HOSPITAL_BASED_OUTPATIENT_CLINIC_OR_DEPARTMENT_OTHER): Payer: Self-pay | Admitting: Oncology

## 2015-09-15 ENCOUNTER — Inpatient Hospital Stay: Payer: Self-pay

## 2015-09-15 ENCOUNTER — Encounter: Payer: Self-pay | Admitting: *Deleted

## 2015-09-15 VITALS — BP 117/68 | HR 106 | Temp 97.3°F | Wt 120.4 lb

## 2015-09-15 DIAGNOSIS — R531 Weakness: Secondary | ICD-10-CM

## 2015-09-15 DIAGNOSIS — R5383 Other fatigue: Secondary | ICD-10-CM

## 2015-09-15 DIAGNOSIS — N289 Disorder of kidney and ureter, unspecified: Secondary | ICD-10-CM

## 2015-09-15 DIAGNOSIS — H578 Other specified disorders of eye and adnexa: Secondary | ICD-10-CM

## 2015-09-15 DIAGNOSIS — Z79899 Other long term (current) drug therapy: Secondary | ICD-10-CM

## 2015-09-15 DIAGNOSIS — C9002 Multiple myeloma in relapse: Secondary | ICD-10-CM

## 2015-09-15 DIAGNOSIS — M1990 Unspecified osteoarthritis, unspecified site: Secondary | ICD-10-CM

## 2015-09-15 DIAGNOSIS — C9001 Multiple myeloma in remission: Secondary | ICD-10-CM

## 2015-09-15 DIAGNOSIS — R739 Hyperglycemia, unspecified: Secondary | ICD-10-CM

## 2015-09-15 DIAGNOSIS — D649 Anemia, unspecified: Secondary | ICD-10-CM

## 2015-09-15 DIAGNOSIS — R112 Nausea with vomiting, unspecified: Secondary | ICD-10-CM

## 2015-09-15 LAB — CBC WITH DIFFERENTIAL/PLATELET
BASOS ABS: 0 10*3/uL (ref 0–0.1)
BASOS PCT: 1 %
EOS PCT: 1 %
Eosinophils Absolute: 0.1 10*3/uL (ref 0–0.7)
HEMATOCRIT: 24.7 % — AB (ref 35.0–47.0)
Hemoglobin: 8.5 g/dL — ABNORMAL LOW (ref 12.0–16.0)
Lymphocytes Relative: 18 %
Lymphs Abs: 1 10*3/uL (ref 1.0–3.6)
MCH: 32.1 pg (ref 26.0–34.0)
MCHC: 34.3 g/dL (ref 32.0–36.0)
MCV: 93.5 fL (ref 80.0–100.0)
MONO ABS: 0.8 10*3/uL (ref 0.2–0.9)
Monocytes Relative: 13 %
NEUTROS ABS: 4 10*3/uL (ref 1.4–6.5)
Neutrophils Relative %: 67 %
PLATELETS: 207 10*3/uL (ref 150–440)
RBC: 2.65 MIL/uL — ABNORMAL LOW (ref 3.80–5.20)
RDW: 16.2 % — AB (ref 11.5–14.5)
WBC: 5.9 10*3/uL (ref 3.6–11.0)

## 2015-09-15 LAB — BASIC METABOLIC PANEL
ANION GAP: 7 (ref 5–15)
BUN: 34 mg/dL — ABNORMAL HIGH (ref 6–20)
CALCIUM: 9.2 mg/dL (ref 8.9–10.3)
CO2: 21 mmol/L — ABNORMAL LOW (ref 22–32)
Chloride: 102 mmol/L (ref 101–111)
Creatinine, Ser: 1.37 mg/dL — ABNORMAL HIGH (ref 0.44–1.00)
GFR calc Af Amer: 51 mL/min — ABNORMAL LOW (ref >60)
GFR, EST NON AFRICAN AMERICAN: 44 mL/min — AB (ref >60)
GLUCOSE: 573 mg/dL — AB (ref 65–99)
Potassium: 4.2 mmol/L (ref 3.5–5.1)
Sodium: 130 mmol/L — ABNORMAL LOW (ref 135–145)

## 2015-09-15 MED ORDER — SODIUM CHLORIDE 0.9% FLUSH
10.0000 mL | INTRAVENOUS | Status: DC | PRN
  Administered 2015-09-15: 10 mL via INTRAVENOUS
  Filled 2015-09-15: qty 10

## 2015-09-15 MED ORDER — BORTEZOMIB CHEMO SQ INJECTION 3.5 MG (2.5MG/ML)
1.3000 mg/m2 | Freq: Once | INTRAMUSCULAR | Status: AC
  Administered 2015-09-15: 2 mg via SUBCUTANEOUS
  Filled 2015-09-15: qty 2

## 2015-09-15 MED ORDER — PROCHLORPERAZINE MALEATE 10 MG PO TABS
10.0000 mg | ORAL_TABLET | Freq: Once | ORAL | Status: AC
  Administered 2015-09-15: 10 mg via ORAL
  Filled 2015-09-15: qty 1

## 2015-09-15 MED ORDER — SODIUM CHLORIDE 0.9 % IV SOLN
3.3000 mg | Freq: Once | INTRAVENOUS | Status: AC
  Administered 2015-09-15: 3.3 mg via INTRAVENOUS
  Filled 2015-09-15: qty 4.13

## 2015-09-15 MED ORDER — SODIUM CHLORIDE 0.9 % IV SOLN
Freq: Once | INTRAVENOUS | Status: AC
  Administered 2015-09-15: 11:00:00 via INTRAVENOUS
  Filled 2015-09-15: qty 1000

## 2015-09-15 MED ORDER — HEPARIN SOD (PORK) LOCK FLUSH 100 UNIT/ML IV SOLN
500.0000 [IU] | Freq: Once | INTRAVENOUS | Status: AC
  Administered 2015-09-15: 500 [IU] via INTRAVENOUS
  Filled 2015-09-15: qty 5

## 2015-09-15 NOTE — Progress Notes (Signed)
[facility]  [phone] [phone]  ID: Jamie Keller OB: 1965/12/21  MR#: [id number]  GHW#:[id number]  [name], MD as PCP - General (Family Medicine)  CHIEF COMPLAINT: Multiple Myeloma in relapse with multiple bony lytic lesions.   INTERVAL HISTORY: Patient returns to clinic today for further evaluation and reinitiation of Velcade. Her back pain has improved since starting XRT. She also complains of swelling around her eyes. She has occasional nausea and vomiting.  She denies any recent fevers. She has a fair appetite. She denies any chest pain or shortness of breath.  She denies any constipation or diarrhea. She denies any other pain. She has no urinary complaints. Patient offers no further specific complaints today.  REVIEW OF SYSTEMS:   Review of Systems  Constitutional: Positive for malaise/fatigue. Negative for fever and weight loss.  Respiratory: Negative.  Negative for cough and shortness of breath.   Cardiovascular: Negative.  Negative for chest pain.  Gastrointestinal: Positive for nausea and vomiting. Negative for abdominal pain.  Musculoskeletal: Positive for back pain.  Neurological: Positive for weakness. Negative for tingling and focal weakness.  Psychiatric/Behavioral: The patient is nervous/anxious.     As per HPI. Otherwise, a complete review of systems is negatve.  PAST MEDICAL HISTORY: Past Medical History  Diagnosis Date  . Hypertension   . Osteoarthritis   . Multiple myeloma ([place])   . Post-menopausal 2014    PAST SURGICAL HISTORY: Past Surgical History  Procedure Laterality Date  . Cesarean section      x2    FAMILY HISTORY: Reviewed and unchanged. No reported history of malignancy or chronic disease.     ADVANCED DIRECTIVES:    HEALTH MAINTENANCE: Social History  Substance Use Topics  . Smoking status: Never Smoker   . Smokeless tobacco: Never Used  . Alcohol  Use: No     Allergies  Allergen Reactions  . No Known Allergies     Current Outpatient Prescriptions  Medication Sig Dispense Refill  . glipiZIDE (GLUCOTROL) 5 MG tablet Take 5 mg by mouth.    . lidocaine-prilocaine (EMLA) cream Apply to affected area once 30 g 3  . ondansetron (ZOFRAN) 8 MG tablet Take 1 tablet (8 mg total) by mouth 2 (two) times daily as needed (Nausea or vomiting). 30 tablet 1  . prochlorperazine (COMPAZINE) 10 MG tablet Take 1 tablet (10 mg total) by mouth every 6 (six) hours as needed (Nausea or vomiting). 30 tablet 1  . promethazine (PHENERGAN) 25 MG tablet Take 1 tablet (25 mg total) by mouth every 6 (six) hours as needed for nausea or vomiting. 60 tablet 0  . sucralfate (CARAFATE) 1 g tablet Take 1 tablet (1 g total) by mouth 2 (two) times daily. 60 tablet 3  . verapamil (CALAN) 80 MG tablet Take 80 mg by mouth.    . Vitamin D, Cholecalciferol, 400 UNITS CAPS Take 400 mg by mouth 2 (two) times daily. Reported on 07/31/2015     No current facility-administered medications for this visit.   Facility-Administered Medications Ordered in Other Visits  Medication Dose Route Frequency Provider Last Rate Last Dose  . heparin lock flush 100 unit/mL  500 Units Intravenous Once Lloyd Huger, MD      . sodium chloride flush (NS) 0.9 % injection 10 mL  10 mL Intravenous PRN Lloyd Huger, MD   10 mL at 09/15/15 0944    OBJECTIVE: Filed Vitals:   09/15/15 1006  BP: 117/68  Pulse: 106  Temp: 97.3 F (36.3 C)     Body mass index is 23.51 kg/(m^2).    ECOG FS:1 - Symptomatic but completely ambulatory  General: Well-developed, well-nourished, no acute distress. Eyes: anicteric sclera. Lungs: Clear to auscultation bilaterally. Heart: Regular rate and rhythm. No rubs, murmurs, or gallops. Abdomen: Soft, nontender, nondistended. No organomegaly noted, normoactive bowel sounds. Musculoskeletal: No edema, cyanosis, or clubbing. Neuro: Alert, answering all  questions appropriately. Cranial nerves grossly intact. Skin: No rashes or petechiae noted. Psych: Normal affect.   LAB RESULTS:  Lab Results  Component Value Date   NA 139 07/31/2015   K 3.8 07/31/2015   CL 111 07/31/2015   CO2 21* 07/31/2015   GLUCOSE 126* 07/31/2015   BUN 37* 07/31/2015   CREATININE 1.19* 07/31/2015   CALCIUM 9.6 07/31/2015   PROT 6.9 04/15/2014   ALBUMIN 3.3* 04/15/2014   AST 22 04/15/2014   ALT 45 04/15/2014   ALKPHOS 103 04/15/2014   BILITOT 0.2 04/15/2014   GFRNONAA 52* 07/31/2015   GFRAA >60 07/31/2015    Lab Results  Component Value Date   WBC 5.9 09/15/2015   NEUTROABS 4.0 09/15/2015   HGB 8.5* 09/15/2015   HCT 24.7* 09/15/2015   MCV 93.5 09/15/2015   PLT 207 09/15/2015   Lab Results  Component Value Date   TOTALPROTELP 6.8 07/31/2015   ALBUMINELP 3.3 07/31/2015   A1GS 0.3 07/31/2015   A2GS 1.0 07/31/2015   BETS 1.3 07/31/2015   GAMS 1.0 07/31/2015   MSPIKE 0.4* 07/31/2015   SPEI Comment 07/31/2015     STUDIES: Ct Chest W Contrast  09/02/2015  CLINICAL DATA:  50 year old female presents for restaging of multiple myeloma. EXAM: CT CHEST, ABDOMEN, AND PELVIS WITH CONTRAST TECHNIQUE: Multidetector CT imaging of the chest, abdomen and pelvis was performed following the standard protocol during bolus administration of intravenous contrast. CONTRAST:  157m ISOVUE-300 IOPAMIDOL (ISOVUE-300) INJECTION 61% COMPARISON:  08/07/2013 CT chest, abdomen and pelvis. FINDINGS: CT CHEST Mediastinum/Nodes: Normal heart size. No significant pericardial fluid/thickening. Right internal jugular MediPort terminates in the right atrium near the cavoatrial junction. Great vessels are normal in course and caliber. No central pulmonary emboli. No discrete thyroid nodules. Unremarkable esophagus. No pathologically enlarged axillary, mediastinal or hilar lymph nodes. Lungs/Pleura: No pneumothorax. No pleural effusion. Calcified subcentimeter right lower lobe  granuloma. Subsegmental scarring versus atelectasis in the lingula. No acute consolidative airspace disease or significant pulmonary nodules. Mild mosaic attenuation throughout both lungs. Musculoskeletal: There is patchy confluent mixed lytic and sclerotic change throughout the entire thoracic skeleton, with interval increased sclerosis throughout the bones compared to 08/07/2013. Stable mild chronic T11 vertebral compression fracture. Numerous healed deformities in the bilateral ribs. There is an infiltrative left paraspinal 3.8 x 3.7 x 5.3 cm mass (series 2/ image 36) involving the T9-T11 levels with involvement of the T9-10 left neural foramen and left T9 spinal canal. CT ABDOMEN AND PELVIS Hepatobiliary: Normal liver size. Diffuse hepatic steatosis. No liver mass. Normal gallbladder with no radiopaque cholelithiasis. No biliary ductal dilatation. Pancreas: Normal, with no mass or duct dilation. Spleen: Normal size. No mass. Adrenals/Urinary Tract: Normal adrenals. Normal kidneys with no hydronephrosis and no renal mass. Normal bladder. Stomach/Bowel: Grossly normal stomach. Normal caliber small bowel with no small bowel wall thickening. Normal appendix. Normal large bowel with no diverticulosis, large bowel wall thickening or pericolonic fat stranding. Vascular/Lymphatic: Atherosclerotic nonaneurysmal abdominal aorta. Patent portal, splenic, hepatic and renal veins. No pathologically enlarged lymph nodes in the abdomen or pelvis. Reproductive:  Grossly normal uterus.  No adnexal mass. Other: No pneumoperitoneum, ascites or focal fluid collection. Musculoskeletal: There is patchy confluent mixed lytic and sclerotic change throughout the entire abdominopelvic skeleton, with increased sclerosis throughout the bones compared to 08/07/2013. IMPRESSION: 1. 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 2. Patchy confluent mixed lytic and sclerotic change throughout the entire skeleton, consistent with diffuse myeloma, with increased sclerosis throughout the bones compared to the 08/07/2013 CT study, which could represent interval treatment effect. 3. Stable mild chronic T11 vertebral compression fracture. Numerous healed deformities in the bilateral ribs. 4. No lymphadenopathy in the chest, abdomen or pelvis. 5. Aortic atherosclerosis. 6. Diffuse hepatic steatosis. Electronically Signed   By: Ilona Sorrel M.D.   On: 09/02/2015 12:13   Nm Bone Scan Whole Body  09/02/2015  CLINICAL DATA: Multiple myeloma. EXAM: NUCLEAR MEDICINE WHOLE BODY BONE SCAN TECHNIQUE: Whole body anterior and posterior images were obtained approximately 3 hours after intravenous injection of radiopharmaceutical. RADIOPHARMACEUTICALS:  21.4 mCi Technetium-45mMDP IV COMPARISON:  CT 09/02/2015.  MRI 08/12/2015 . FINDINGS: The bilateral renal function excretion is present. Multiple focal areas of increased activity noted about the skull, thoracic spine, ribs, pelvis, right humerus, sternum. These changes may be related to myeloma however metastatic disease cannot be excluded . IMPRESSION: Multifocal areas of increased activity. These changes may be related to the patient's known myeloma however metastatic disease could also present in this fashion. Electronically Signed   By: TMarcello Moores Register   On: 09/02/2015 14:02   Ct Abdomen Pelvis W Contrast  09/02/2015  CLINICAL DATA:  50year old female presents for restaging of multiple myeloma. EXAM: CT CHEST, ABDOMEN, AND PELVIS WITH CONTRAST TECHNIQUE: Multidetector CT imaging of the chest, abdomen and pelvis was performed following the standard protocol during bolus administration of intravenous contrast. CONTRAST:  1038mISOVUE-300 IOPAMIDOL (ISOVUE-300) INJECTION 61% COMPARISON:  08/07/2013 CT chest, abdomen and  pelvis. FINDINGS: CT CHEST Mediastinum/Nodes: Normal heart size. No significant pericardial fluid/thickening. Right internal jugular MediPort terminates in the right atrium near the cavoatrial junction. Great vessels are normal in course and caliber. No central pulmonary emboli. No discrete thyroid nodules. Unremarkable esophagus. No pathologically enlarged axillary, mediastinal or hilar lymph nodes. Lungs/Pleura: No pneumothorax. No pleural effusion. Calcified subcentimeter right lower lobe granuloma. Subsegmental scarring versus atelectasis in the lingula. No acute consolidative airspace disease or significant pulmonary nodules. Mild mosaic attenuation throughout both lungs. Musculoskeletal: There is patchy confluent mixed lytic and sclerotic change throughout the entire thoracic skeleton, with interval increased sclerosis throughout the bones compared to 08/07/2013. Stable mild chronic T11 vertebral compression fracture. Numerous healed deformities in the bilateral ribs. There is an infiltrative left paraspinal 3.8 x 3.7 x 5.3 cm mass (series 2/ image 36) involving the T9-T11 levels with involvement of the T9-10 left neural foramen and left T9 spinal canal. CT ABDOMEN AND PELVIS Hepatobiliary: Normal liver size. Diffuse hepatic steatosis. No liver mass. Normal gallbladder with no radiopaque cholelithiasis. No biliary ductal dilatation. Pancreas: Normal, with no mass or duct dilation. Spleen: Normal size. No mass. Adrenals/Urinary Tract: Normal adrenals. Normal kidneys with no hydronephrosis and no renal mass. Normal bladder. Stomach/Bowel: Grossly normal stomach. Normal caliber small bowel with no small bowel wall thickening. Normal appendix. Normal large bowel with no diverticulosis, large bowel wall thickening or pericolonic fat stranding. Vascular/Lymphatic: Atherosclerotic nonaneurysmal abdominal aorta.  Patent portal, splenic, hepatic and renal veins. No pathologically enlarged lymph nodes in the abdomen or  pelvis. Reproductive: Grossly normal uterus.  No adnexal mass. Other: No pneumoperitoneum, ascites or focal fluid collection. Musculoskeletal: There is patchy confluent mixed lytic and sclerotic change throughout the entire abdominopelvic skeleton, with increased sclerosis throughout the bones compared to 08/07/2013. IMPRESSION: 1. 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 2. Patchy confluent mixed lytic and sclerotic change throughout the entire skeleton, consistent with diffuse myeloma, with increased sclerosis throughout the bones compared to the 08/07/2013 CT study, which could represent interval treatment effect. 3. Stable mild chronic T11 vertebral compression fracture. Numerous healed deformities in the bilateral ribs. 4. No lymphadenopathy in the chest, abdomen or pelvis. 5. Aortic atherosclerosis. 6. Diffuse hepatic steatosis. Electronically Signed   By: Ilona Sorrel M.D.   On: 09/02/2015 12:13    ASSESSMENT: Multiple Myeloma in relapse with multiple bony lytic lesions.   PLAN:    1. Multiple Myeloma in relapse with multiple bony lytic lesions: MRI, CT, bone scan results reviewed independently indicating progression of disease. Patient's M spike has trended up slightly to 0.4. Continue daily XRT. Proceed with single agent Velcade today on days 1, 4, 8, and 11. Will also continue Zometa every 4 weeks. Because of patient's immigration status, she could not undergo transplant. Return to clinic for Velcade and then in 3 weeks for consideration of cycle 2. 2. Back pain:  XRT as above. Continue current narcotic regimen. 3. Renal insufficiency: Mild, monitor. 4. Anemia: Monitor. 5. Hyperglycemia: Patient's blood glucose is significantly elevated today. Patient states she is "prediabetic" and has not been  following her diet as prescribed by her primary care physician. Patient had an appointment with her PCP tomorrow, but this coincides with her XRT treatment therefore she will reschedule. Have instructed that she needs to be evaluated for management of her diabetes within 1 week. 6. Eye swelling: Likely secondary to significantly elevated blood glucose.  The entire visit was done in the presence of an interpreter.  Patient expressed understanding and was in agreement with this plan. She also understands that She can call clinic at any time with any questions, concerns, or complaints.    Lloyd Huger, MD   09/15/2015 10:21 AM

## 2015-09-15 NOTE — Progress Notes (Signed)
Patient ambulates without assistance, brought to exam room 7.  Patient denies pain or discomfort at this time, but states when she stands for long periods of time she has right lower back pain.  Vitals documented.  Medication record updated, information provided by patient.

## 2015-09-15 NOTE — Progress Notes (Signed)
Lab results reviewed with Dr. Grayland Ormond. Proceed with Zometa and Velcade treatment today per MD order.

## 2015-09-15 NOTE — Progress Notes (Signed)
Faxed copy of lab results to Dr. Ladoris Gene at [facility]. Patient had critical high glucose of 573 today. Patient reports she has been eating diet high in sugar and has not had recent follow up with PCP. Patient encouraged to schedule follow up appointment this week with Dr. Ladoris Gene. Patient verbalized understanding.

## 2015-09-16 ENCOUNTER — Ambulatory Visit
Admission: RE | Admit: 2015-09-16 | Discharge: 2015-09-16 | Disposition: A | Discharge status: Home / Self Care | Payer: Self-pay | Source: Ambulatory Visit | Attending: Radiation Oncology | Admitting: Radiation Oncology

## 2015-09-16 ENCOUNTER — Inpatient Hospital Stay: Payer: Self-pay

## 2015-09-17 ENCOUNTER — Ambulatory Visit
Admission: RE | Admit: 2015-09-17 | Discharge: 2015-09-17 | Disposition: A | Discharge status: Home / Self Care | Payer: Self-pay | Source: Ambulatory Visit | Attending: Radiation Oncology | Admitting: Radiation Oncology

## 2015-09-17 ENCOUNTER — Inpatient Hospital Stay: Payer: Self-pay

## 2015-09-18 ENCOUNTER — Inpatient Hospital Stay: Payer: Self-pay

## 2015-09-18 ENCOUNTER — Telehealth: Payer: Self-pay | Admitting: *Deleted

## 2015-09-18 ENCOUNTER — Ambulatory Visit: Payer: Self-pay

## 2015-09-18 VITALS — BP 138/74 | HR 103 | Temp 96.6°F | Resp 16

## 2015-09-18 DIAGNOSIS — C9002 Multiple myeloma in relapse: Secondary | ICD-10-CM

## 2015-09-18 MED ORDER — PROCHLORPERAZINE MALEATE 10 MG PO TABS
10.0000 mg | ORAL_TABLET | Freq: Once | ORAL | Status: AC
  Administered 2015-09-18: 10 mg via ORAL
  Filled 2015-09-18: qty 1

## 2015-09-18 MED ORDER — BORTEZOMIB CHEMO SQ INJECTION 3.5 MG (2.5MG/ML)
1.3000 mg/m2 | Freq: Once | INTRAMUSCULAR | Status: AC
  Administered 2015-09-18: 2 mg via SUBCUTANEOUS
  Filled 2015-09-18: qty 2

## 2015-09-18 NOTE — Telephone Encounter (Signed)
Call received from [facility] in radiation regarding patients vital signs. Patient seen in radiation oncology and was found to have elevated HR at 103 today. Patient states her heart has been "pounding in her chest" and her HR has been elevated for a while and prior to beginning Velcade this week. I told [name] to have patient call her PCP asap to be seen due to her blood sugars and now elevated HR. Lab results faxed to Dr. Ladoris Gene earlier this week regarding patients blood sugars.

## 2015-09-18 NOTE — Telephone Encounter (Signed)
Sounds good. Thank you

## 2015-09-19 ENCOUNTER — Ambulatory Visit: Payer: Self-pay

## 2015-09-22 ENCOUNTER — Inpatient Hospital Stay: Payer: Self-pay

## 2015-09-22 ENCOUNTER — Emergency Department: Payer: Self-pay

## 2015-09-22 ENCOUNTER — Telehealth: Payer: Self-pay | Admitting: *Deleted

## 2015-09-22 ENCOUNTER — Emergency Department
Admission: EM | Admit: 2015-09-22 | Discharge: 2015-09-22 | Disposition: A | Discharge status: Home / Self Care | Payer: Self-pay | Attending: Student | Admitting: Student

## 2015-09-22 VITALS — BP 134/73 | HR 99 | Temp 96.7°F | Resp 18

## 2015-09-22 DIAGNOSIS — J40 Bronchitis, not specified as acute or chronic: Secondary | ICD-10-CM | POA: Insufficient documentation

## 2015-09-22 DIAGNOSIS — M1990 Unspecified osteoarthritis, unspecified site: Secondary | ICD-10-CM | POA: Insufficient documentation

## 2015-09-22 DIAGNOSIS — C9002 Multiple myeloma in relapse: Secondary | ICD-10-CM

## 2015-09-22 DIAGNOSIS — E1165 Type 2 diabetes mellitus with hyperglycemia: Secondary | ICD-10-CM | POA: Insufficient documentation

## 2015-09-22 DIAGNOSIS — Z7984 Long term (current) use of oral hypoglycemic drugs: Secondary | ICD-10-CM | POA: Insufficient documentation

## 2015-09-22 DIAGNOSIS — R739 Hyperglycemia, unspecified: Secondary | ICD-10-CM

## 2015-09-22 DIAGNOSIS — I10 Essential (primary) hypertension: Secondary | ICD-10-CM | POA: Insufficient documentation

## 2015-09-22 DIAGNOSIS — Z79899 Other long term (current) drug therapy: Secondary | ICD-10-CM | POA: Insufficient documentation

## 2015-09-22 LAB — COMPREHENSIVE METABOLIC PANEL
ALBUMIN: 4 g/dL (ref 3.5–5.0)
ALBUMIN: 3.7 g/dL (ref 3.5–5.0)
ALK PHOS: 127 U/L — AB (ref 38–126)
ALT: 68 U/L — ABNORMAL HIGH (ref 14–54)
ALT: 65 U/L — ABNORMAL HIGH (ref 14–54)
ANION GAP: 9 (ref 5–15)
AST: 65 U/L — AB (ref 15–41)
AST: 59 U/L — AB (ref 15–41)
Alkaline Phosphatase: 101 U/L (ref 38–126)
Anion gap: 10 (ref 5–15)
BILIRUBIN TOTAL: 0.7 mg/dL (ref 0.3–1.2)
BUN: 38 mg/dL — AB (ref 6–20)
BUN: 37 mg/dL — AB (ref 6–20)
CALCIUM: 9.4 mg/dL (ref 8.9–10.3)
CHLORIDE: 101 mmol/L (ref 101–111)
CO2: 20 mmol/L — ABNORMAL LOW (ref 22–32)
CO2: 18 mmol/L — ABNORMAL LOW (ref 22–32)
Calcium: 9.2 mg/dL (ref 8.9–10.3)
Chloride: 98 mmol/L — ABNORMAL LOW (ref 101–111)
Creatinine, Ser: 1.7 mg/dL — ABNORMAL HIGH (ref 0.44–1.00)
Creatinine, Ser: 1.68 mg/dL — ABNORMAL HIGH (ref 0.44–1.00)
GFR calc Af Amer: 39 mL/min — ABNORMAL LOW (ref >60)
GFR calc Af Amer: 40 mL/min — ABNORMAL LOW (ref >60)
GFR, EST NON AFRICAN AMERICAN: 34 mL/min — AB (ref >60)
GFR, EST NON AFRICAN AMERICAN: 34 mL/min — AB (ref >60)
GLUCOSE: 585 mg/dL — AB (ref 65–99)
Glucose, Bld: 490 mg/dL — ABNORMAL HIGH (ref 65–99)
POTASSIUM: 4.5 mmol/L (ref 3.5–5.1)
Potassium: 4.4 mmol/L (ref 3.5–5.1)
Sodium: 127 mmol/L — ABNORMAL LOW (ref 135–145)
Sodium: 129 mmol/L — ABNORMAL LOW (ref 135–145)
TOTAL PROTEIN: 8 g/dL (ref 6.5–8.1)
Total Bilirubin: 0.6 mg/dL (ref 0.3–1.2)
Total Protein: 7.2 g/dL (ref 6.5–8.1)

## 2015-09-22 LAB — URINALYSIS COMPLETE WITH MICROSCOPIC (ARMC ONLY)
Bilirubin Urine: NEGATIVE
Glucose, UA: >500 mg/dL — AB
Ketones, ur: NEGATIVE mg/dL
LEUKOCYTES UA: NEGATIVE
Nitrite: NEGATIVE
PH: 6 (ref 5.0–8.0)
PROTEIN: 30 mg/dL — AB
Specific Gravity, Urine: 1.02 (ref 1.005–1.030)

## 2015-09-22 LAB — CBC WITH DIFFERENTIAL/PLATELET
BASOS ABS: 0 10*3/uL (ref 0–0.1)
BASOS PCT: 1 %
Basophils Absolute: 0 10*3/uL (ref 0–0.1)
Basophils Relative: 1 %
EOS PCT: 1 %
Eosinophils Absolute: 0.1 10*3/uL (ref 0–0.7)
Eosinophils Absolute: 0.1 10*3/uL (ref 0–0.7)
Eosinophils Relative: 1 %
HEMATOCRIT: 27.5 % — AB (ref 35.0–47.0)
HEMATOCRIT: 27.2 % — AB (ref 35.0–47.0)
HEMOGLOBIN: 9.4 g/dL — AB (ref 12.0–16.0)
HEMOGLOBIN: 9.5 g/dL — AB (ref 12.0–16.0)
LYMPHS PCT: 13 %
LYMPHS PCT: 12 %
Lymphs Abs: 0.8 10*3/uL — ABNORMAL LOW (ref 1.0–3.6)
Lymphs Abs: 0.7 10*3/uL — ABNORMAL LOW (ref 1.0–3.6)
MCH: 31.8 pg (ref 26.0–34.0)
MCH: 32.4 pg (ref 26.0–34.0)
MCHC: 34.1 g/dL (ref 32.0–36.0)
MCHC: 34.9 g/dL (ref 32.0–36.0)
MCV: 93.2 fL (ref 80.0–100.0)
MCV: 92.9 fL (ref 80.0–100.0)
MONO ABS: 1.1 10*3/uL — AB (ref 0.2–0.9)
Monocytes Absolute: 1 10*3/uL — ABNORMAL HIGH (ref 0.2–0.9)
Monocytes Relative: 16 %
Monocytes Relative: 18 %
NEUTROS ABS: 4.1 10*3/uL (ref 1.4–6.5)
NEUTROS ABS: 4.4 10*3/uL (ref 1.4–6.5)
Neutrophils Relative %: 69 %
Neutrophils Relative %: 68 %
Platelets: 159 10*3/uL (ref 150–440)
Platelets: 141 10*3/uL — ABNORMAL LOW (ref 150–440)
RBC: 2.96 MIL/uL — ABNORMAL LOW (ref 3.80–5.20)
RBC: 2.93 MIL/uL — AB (ref 3.80–5.20)
RDW: 16.3 % — ABNORMAL HIGH (ref 11.5–14.5)
RDW: 16.6 % — AB (ref 11.5–14.5)
WBC: 6 10*3/uL (ref 3.6–11.0)
WBC: 6.3 10*3/uL (ref 3.6–11.0)

## 2015-09-22 LAB — BLOOD GAS, VENOUS
Acid-base deficit: 8.9 mmol/L — ABNORMAL HIGH (ref 0.0–2.0)
BICARBONATE: 14.9 meq/L — AB (ref 21.0–28.0)
FIO2: 21
O2 Saturation: 97.3 %
PATIENT TEMPERATURE: 37
pCO2, Ven: 24 mmHg — ABNORMAL LOW (ref 44.0–60.0)
pH, Ven: 7.4 (ref 7.320–7.430)
pO2, Ven: 94 mmHg — ABNORMAL HIGH (ref 31.0–45.0)

## 2015-09-22 LAB — GLUCOSE, CAPILLARY
GLUCOSE-CAPILLARY: 484 mg/dL — AB (ref 65–99)
GLUCOSE-CAPILLARY: 395 mg/dL — AB (ref 65–99)
Glucose-Capillary: 328 mg/dL — ABNORMAL HIGH (ref 65–99)

## 2015-09-22 MED ORDER — ALBUTEROL SULFATE HFA 108 (90 BASE) MCG/ACT IN AERS: 2.0000 | INHALATION_SPRAY | Freq: Four times a day (QID) | RESPIRATORY_TRACT | Status: DC | PRN

## 2015-09-22 MED ORDER — INSULIN ASPART 100 UNIT/ML ~~LOC~~ SOLN
6.0000 [IU] | Freq: Once | SUBCUTANEOUS | Status: AC
  Administered 2015-09-22: 6 [IU] via INTRAVENOUS
  Filled 2015-09-22: qty 6

## 2015-09-22 MED ORDER — PROCHLORPERAZINE MALEATE 10 MG PO TABS
10.0000 mg | ORAL_TABLET | Freq: Once | ORAL | Status: AC
  Administered 2015-09-22: 10 mg via ORAL
  Filled 2015-09-22: qty 1

## 2015-09-22 MED ORDER — SODIUM CHLORIDE 0.9 % IV BOLUS (SEPSIS)
1000.0000 mL | Freq: Once | INTRAVENOUS | Status: AC
  Administered 2015-09-22: 1000 mL via INTRAVENOUS

## 2015-09-22 MED ORDER — BORTEZOMIB CHEMO SQ INJECTION 3.5 MG (2.5MG/ML)
1.3000 mg/m2 | Freq: Once | INTRAMUSCULAR | Status: AC
  Administered 2015-09-22: 2 mg via SUBCUTANEOUS
  Filled 2015-09-22: qty 2

## 2015-09-22 MED ORDER — GLIPIZIDE 10 MG PO TABS: 10.0000 mg | ORAL_TABLET | Freq: Every day | ORAL | Status: DC

## 2015-09-22 MED ORDER — BENZONATATE 100 MG PO CAPS: 100.0000 mg | ORAL_CAPSULE | Freq: Three times a day (TID) | ORAL | Status: DC | PRN

## 2015-09-22 NOTE — ED Provider Notes (Addendum)
[facility] Emergency Department Provider Note   ____________________________________________  Time seen: Approximately 3:16 PM  I have reviewed the triage vital signs and the nursing notes.   HISTORY  Chief Complaint Hyperglycemia    HPI Jamie Keller is a 49 y.o. female with hypertension, multiple myeloma currently being treated with Velcade, diabetes who presents for evaluation of hyperglycemia ongoing for at least a week, gradual onset, constant, severe, no modifying factors. Patient saw her oncologist Dr. Grayland Ormond this morning and received her Velcade injection however her blood glucose read high. This happened last week and she was told to follow-up with her primary care doctor but she did not do that. She was sent to the emergency department for evaluation. She has had cough but no fever or chills. She has had some posttussive emesis but otherwise no vomiting or diarrhea. No dysuria or hematuria. She reports that her vision in both eyes has been worsening over the past 2-3 weeks. She denies any sudden vision loss or eye pain. No recent steroid use.   Past Medical History  Diagnosis Date  . Hypertension   . Osteoarthritis   . Multiple myeloma ([place])   . Post-menopausal 2014    Patient Active Problem List   Diagnosis Date Noted  . Multiple myeloma in relapse ([place]) 09/06/2015  . Post-menopausal   . Multiple myeloma in remission ([place]) 07/24/2014    Past Surgical History  Procedure Laterality Date  . Cesarean section      x2    Current Outpatient Rx  Name  Route  Sig  Dispense  Refill  . albuterol (PROVENTIL HFA;VENTOLIN HFA) 108 (90 Base) MCG/ACT inhaler   Inhalation   Inhale 2 puffs into the lungs every 6 (six) hours as needed for wheezing or shortness of breath.   1 Inhaler   0   . benzonatate (TESSALON PERLES) 100 MG capsule   Oral   Take 1 capsule (100 mg total) by mouth 3 (three) times daily as needed for cough.   15  capsule   0   . glipiZIDE (GLUCOTROL) 10 MG tablet   Oral   Take 1 tablet (10 mg total) by mouth daily.   30 tablet   0   . lidocaine-prilocaine (EMLA) cream      Apply to affected area once   30 g   3   . ondansetron (ZOFRAN) 8 MG tablet   Oral   Take 1 tablet (8 mg total) by mouth 2 (two) times daily as needed (Nausea or vomiting).   30 tablet   1   . prochlorperazine (COMPAZINE) 10 MG tablet   Oral   Take 1 tablet (10 mg total) by mouth every 6 (six) hours as needed (Nausea or vomiting).   30 tablet   1   . promethazine (PHENERGAN) 25 MG tablet   Oral   Take 1 tablet (25 mg total) by mouth every 6 (six) hours as needed for nausea or vomiting.   60 tablet   0     [place] per [employer], SW   . sucralfate (CARAFATE) 1 g tablet   Oral   Take 1 tablet (1 g total) by mouth 2 (two) times daily.   60 tablet   3     Place on [employer] per [employer], S.W.   . verapamil (CALAN) 80 MG tablet   Oral   Take 80 mg by mouth.         . Vitamin  D, Cholecalciferol, 400 UNITS CAPS   Oral   Take 400 mg by mouth 2 (two) times daily. Reported on 07/31/2015           Allergies No known allergies  Family History  Problem Relation Age of Onset  . Hypercholesterolemia Mother   . Hypertension Father     Social History Social History  Substance Use Topics  . Smoking status: Never Smoker   . Smokeless tobacco: Never Used  . Alcohol Use: No    Review of Systems Constitutional: No fever/chills Eyes: No visual changes. ENT: No sore throat. Cardiovascular: Denies chest pain. Respiratory: Denies shortness of breath. Gastrointestinal: No abdominal pain.  No nausea, no vomiting.  No diarrhea.  No constipation. Genitourinary: Negative for dysuria. Musculoskeletal: Negative for back pain. Skin: Negative for rash. Neurological: Negative for headaches, focal weakness or numbness.  10-point ROS otherwise  negative.  ____________________________________________   PHYSICAL EXAM:  Filed Vitals:   09/22/15 1545 09/22/15 1600 09/22/15 1615 09/22/15 1630  BP:  132/64  114/58  Pulse: 103 105 103 102  Temp:      TempSrc:      Resp:      Height:      Weight:      SpO2: 100% 98% 98% 97%    VITAL SIGNS: ED Triage Vitals  Enc Vitals Group     BP 09/22/15 1506 145/64 mmHg     Pulse Rate 09/22/15 1506 100     Resp 09/22/15 1506 20     Temp 09/22/15 1506 98.8 F (37.1 C)     Temp Source 09/22/15 1506 Oral     SpO2 09/22/15 1506 99 %     Weight 09/22/15 1506 124 lb (56.246 kg)     Height 09/22/15 1506 5' 3"  (1.6 m)     Head Cir --      Peak Flow --      Pain Score 09/22/15 1507 3     Pain Loc --      Pain Edu? --      Excl. in [place]? --     Constitutional: Alert and oriented. Well appearing and in no acute distress. Eyes: Conjunctivae are normal. PERRL. EOMI. Head: Atraumatic. Nose: No congestion/rhinnorhea. Mouth/Throat: Mucous membranes are moist.  Oropharynx non-erythematous. Neck: No stridor.  Supple without meningismus. Cardiovascular: Normal rate, regular rhythm. Grossly normal heart sounds.  Good peripheral circulation. Respiratory: Normal respiratory effort.  No retractions. Lungs CTAB. Gastrointestinal: Soft and nontender. No distention.  No CVA tenderness. Genitourinary: deferred Musculoskeletal: No lower extremity tenderness nor edema.  No joint effusions. Neurologic:  Normal speech and language. No gross focal neurologic deficits are appreciated. No gait instability. Skin:  Skin is warm, dry and intact. No rash noted. Psychiatric: Mood and affect are normal. Speech and behavior are normal.  ____________________________________________   LABS (all labs ordered are listed, but only abnormal results are displayed)  Labs Reviewed  BLOOD GAS, VENOUS - Abnormal; Notable for the following:    pCO2, Ven 24 (*)    pO2, Ven 94.0 (*)    Bicarbonate 14.9 (*)    Acid-base  deficit 8.9 (*)    All other components within normal limits  COMPREHENSIVE METABOLIC PANEL - Abnormal; Notable for the following:    Sodium 129 (*)    CO2 18 (*)    Glucose, Bld 490 (*)    BUN 37 (*)    Creatinine, Ser 1.68 (*)    AST 59 (*)    ALT 65 (*)  GFR calc non Af Amer 34 (*)    GFR calc Af Amer 40 (*)    All other components within normal limits  GLUCOSE, CAPILLARY - Abnormal; Notable for the following:    Glucose-Capillary 484 (*)    All other components within normal limits  CBC WITH DIFFERENTIAL/PLATELET - Abnormal; Notable for the following:    RBC 2.93 (*)    Hemoglobin 9.5 (*)    HCT 27.2 (*)    RDW 16.6 (*)    Platelets 141 (*)    Lymphs Abs 0.7 (*)    Monocytes Absolute 1.1 (*)    All other components within normal limits  URINALYSIS COMPLETEWITH MICROSCOPIC ([facility] ONLY) - Abnormal; Notable for the following:    Color, Urine STRAW (*)    APPearance CLEAR (*)    Glucose, UA >500 (*)    Hgb urine dipstick 1+ (*)    Protein, ur 30 (*)    Bacteria, UA RARE (*)    Squamous Epithelial / LPF 0-5 (*)    All other components within normal limits  GLUCOSE, CAPILLARY - Abnormal; Notable for the following:    Glucose-Capillary 395 (*)    All other components within normal limits  GLUCOSE, CAPILLARY - Abnormal; Notable for the following:    Glucose-Capillary 328 (*)    All other components within normal limits  CBC WITH DIFFERENTIAL/PLATELET  CBG MONITORING, ED   ____________________________________________  EKG  none ____________________________________________  RADIOLOGY  CXR IMPRESSION: 1. Mild diffuse peribronchial cuffing, suggesting an acute bronchitis.  ____________________________________________   PROCEDURES  Procedure(s) performed: None  Procedures  Critical Care performed: No  ____________________________________________   INITIAL IMPRESSION / ASSESSMENT AND PLAN / ED COURSE  Pertinent labs & imaging results that were  available during my care of the patient were reviewed by me and considered in my medical decision making (see chart for details).  Mauria Ranae Palms is a 50 y.o. female with hypertension, multiple myeloma currently being treated with Velcade, diabetes who presents for evaluation of hyperglycemia ongoing for at least a week. She has also had increased clarity in her long distance vision over the past 2-3 weeks. On exam, she is nontoxic appearing and in no acute distress. Her vital signs are stable, she is afebrile. She is a physical examination. We'll obtain screening labs, chest x-ray, urinalysis, treat her hyperglycemia, give IV fluids and reassess for disposition.  ----------------------------------------- 8:07 PM on 09/22/2015 ----------------------------------------- Patient continues to rest comfortably with no complaints. Her heart rate is 102 bpm, on chart review, during her cancer center visits, she is also mildly tachycardic and I suspect this is her baseline. She has no chest pain, no difficulty breathing and I doubt  PE. Her blood sugar has improved to 328 with IV fluids and insulin. She reports the me that she has not been taking her glipizide over the past month because she was feeling well and thought she didn't need to take it anymore. She has also not been checking her blood sugars. I discussed with her that we would restart this medication at the same dose, that she will take it and monitor her blood sugars and follow up with her primary care doctor within the next 2-3 days. CXR shows bronchitis, no pneumonia, we'll discharge with albuterol inhaler as well as Tessalon Perles. Urinalysis is not consistent with infection. CBC with mild chronic anemia, hemoglobin 9.5. CMP shows creatinine elevation at 1.68 which is improved from prior, sodium corrects appropriately to 135 when accounting for the  degree of initial hyperglycemia. ABG with pH of 7.4, anion gap 10, no DKA. We discussed the  return precautions, need for close follow-up and she and her daughter at bedside are comfortable with the discharge plan. All information was discussed with the assistance of Spanish interpreter and questions were answered to her satisfaction.  ____________________________________________   FINAL CLINICAL IMPRESSION(S) / ED DIAGNOSES  Final diagnoses:  Hyperglycemia  Bronchitis      NEW MEDICATIONS STARTED DURING THIS VISIT:  New Prescriptions   ALBUTEROL (PROVENTIL HFA;VENTOLIN HFA) 108 (90 BASE) MCG/ACT INHALER    Inhale 2 puffs into the lungs every 6 (six) hours as needed for wheezing or shortness of breath.   BENZONATATE (TESSALON PERLES) 100 MG CAPSULE    Take 1 capsule (100 mg total) by mouth 3 (three) times daily as needed for cough.   GLIPIZIDE (GLUCOTROL) 10 MG TABLET    Take 1 tablet (10 mg total) by mouth daily.     Note:  This document was prepared using Dragon voice recognition software and may include unintentional dictation errors.    Joanne Gavel, MD 09/22/15 2010  Joanne Gavel, MD 09/22/15 2023

## 2015-09-22 NOTE — Telephone Encounter (Signed)
Jamie Keller called with critical glucose of 585. Dr. Grayland Ormond notified of patients critical results. Patient to be evaluated in ER for high glucose, I spoke with April in infusion and she will let patient know to go the ER after her Velcade injection today.

## 2015-09-22 NOTE — ED Notes (Signed)
Pt sent from the cancer center with FS>500 with c/o changes in vision.

## 2015-09-22 NOTE — ED Notes (Signed)
Blood glucose 395

## 2015-09-22 NOTE — ED Notes (Signed)
Pt assisted to bathroom to urinate 

## 2015-09-23 LAB — IGG, IGA, IGM
IGM, SERUM: 20 mg/dL — AB (ref 26–217)
IgA: 128 mg/dL (ref 87–352)
IgG (Immunoglobin G), Serum: 1063 mg/dL (ref 700–1600)

## 2015-09-24 LAB — PROTEIN ELECTROPHORESIS, SERUM
A/G RATIO SPE: 1 (ref 0.7–1.7)
ALPHA-1-GLOBULIN: 0.3 g/dL (ref 0.0–0.4)
ALPHA-2-GLOBULIN: 1.1 g/dL — AB (ref 0.4–1.0)
Albumin ELP: 3.4 g/dL (ref 2.9–4.4)
BETA GLOBULIN: 1.2 g/dL (ref 0.7–1.3)
Gamma Globulin: 0.9 g/dL (ref 0.4–1.8)
Globulin, Total: 3.5 g/dL (ref 2.2–3.9)
M-Spike, %: 0.4 g/dL — ABNORMAL HIGH
Total Protein ELP: 6.9 g/dL (ref 6.0–8.5)

## 2015-09-24 LAB — KAPPA/LAMBDA LIGHT CHAINS
Kappa free light chain: 3535.3 mg/L — ABNORMAL HIGH (ref 3.3–19.4)
Kappa, lambda light chain ratio: 310.11 — ABNORMAL HIGH (ref 0.26–1.65)
LAMDA FREE LIGHT CHAINS: 11.4 mg/L (ref 5.7–26.3)

## 2015-09-25 ENCOUNTER — Inpatient Hospital Stay: Payer: Self-pay

## 2015-09-25 VITALS — BP 125/70 | HR 108 | Temp 97.6°F | Resp 16

## 2015-09-25 DIAGNOSIS — C9002 Multiple myeloma in relapse: Secondary | ICD-10-CM

## 2015-09-25 MED ORDER — BORTEZOMIB CHEMO SQ INJECTION 3.5 MG (2.5MG/ML)
1.3000 mg/m2 | Freq: Once | INTRAMUSCULAR | Status: AC
  Administered 2015-09-25: 2 mg via SUBCUTANEOUS
  Filled 2015-09-25: qty 2

## 2015-09-25 MED ORDER — PROCHLORPERAZINE MALEATE 10 MG PO TABS
10.0000 mg | ORAL_TABLET | Freq: Once | ORAL | Status: AC
  Administered 2015-09-25: 10 mg via ORAL
  Filled 2015-09-25: qty 1

## 2015-09-25 NOTE — Progress Notes (Signed)
Creatinine level is elevated to 1.68, per Dr. Florian Buff patient may proceed with injection

## 2015-09-30 ENCOUNTER — Ambulatory Visit: Payer: Self-pay

## 2015-09-30 ENCOUNTER — Other Ambulatory Visit: Payer: Self-pay

## 2015-09-30 ENCOUNTER — Ambulatory Visit: Payer: Self-pay | Admitting: Oncology

## 2015-10-03 ENCOUNTER — Other Ambulatory Visit: Payer: Self-pay | Admitting: Oncology

## 2015-10-03 MED ORDER — OXYCODONE HCL 10 MG PO TABS: 10.0000 mg | ORAL_TABLET | Freq: Four times a day (QID) | ORAL | 0 refills | Status: DC | PRN

## 2015-10-05 NOTE — Progress Notes (Signed)
[place]  [phone] [phone]  ID: Jamie Keller OB: [birth year]  MR#: [id number]  WGY#:[id number]  [name], MD as PCP - General (Family Medicine)  CHIEF COMPLAINT: Multiple Myeloma in relapse with multiple bony lytic lesions.   INTERVAL HISTORY: Patient returns to clinic today for further evaluation and cycle 2 of Velcade. Her back pain has improved since starting XRT, but still evident. She has a poor appetite and weight loss. She does not complain of any further nausea or vomiting. She denies any recent fevers. She denies any chest pain or shortness of breath.  She denies any constipation or diarrhea. She denies any other pain. She has no urinary complaints. Patient offers no further specific complaints today.  REVIEW OF SYSTEMS:   Review of Systems  Constitutional: Positive for malaise/fatigue and weight loss. Negative for fever.  Respiratory: Negative.  Negative for cough and shortness of breath.   Cardiovascular: Negative.  Negative for chest pain.  Gastrointestinal: Negative.  Negative for abdominal pain, nausea and vomiting.  Musculoskeletal: Positive for back pain.  Neurological: Positive for weakness. Negative for tingling and focal weakness.  Psychiatric/Behavioral: Positive for depression. The patient is nervous/anxious.     As per HPI. Otherwise, a complete review of systems is negatve.  PAST MEDICAL HISTORY: Past Medical History:  Diagnosis Date  . Hypertension   . Multiple myeloma ([place])   . Osteoarthritis   . Post-menopausal 2014    PAST SURGICAL HISTORY: Past Surgical History:  Procedure Laterality Date  . CESAREAN SECTION     x2    FAMILY HISTORY: Reviewed and unchanged. No reported history of malignancy or chronic disease.     ADVANCED DIRECTIVES:    HEALTH MAINTENANCE: Social History  Substance Use Topics  . Smoking status: Never Smoker  . Smokeless tobacco:  Never Used  . Alcohol use No     Allergies  Allergen Reactions  . No Known Allergies     Current Outpatient Prescriptions  Medication Sig Dispense Refill  . Oxycodone HCl 10 MG TABS Take 1 tablet (10 mg total) by mouth every 6 (six) hours as needed. 60 tablet 0  . verapamil (CALAN) 80 MG tablet Take 80 mg by mouth.    . megestrol (MEGACE) 40 MG tablet Take 1 tablet (40 mg total) by mouth daily. 30 tablet 2  . ondansetron (ZOFRAN) 8 MG tablet Take 1 tablet (8 mg total) by mouth 2 (two) times daily as needed (Nausea or vomiting). (Patient not taking: Reported on 10/06/2015) 30 tablet 1  . prochlorperazine (COMPAZINE) 10 MG tablet Take 1 tablet (10 mg total) by mouth every 6 (six) hours as needed (Nausea or vomiting). (Patient not taking: Reported on 10/06/2015) 30 tablet 1  . promethazine (PHENERGAN) 25 MG tablet Take 1 tablet (25 mg total) by mouth every 6 (six) hours as needed for nausea or vomiting. (Patient not taking: Reported on 10/06/2015) 60 tablet 0  . sucralfate (CARAFATE) 1 g tablet Take 1 tablet (1 g total) by mouth 2 (two) times daily. (Patient not taking: Reported on 10/06/2015) 60 tablet 3  . Vitamin D, Cholecalciferol, 400 UNITS CAPS Take 400 mg by mouth 2 (two) times daily. Reported on 07/31/2015     No current facility-administered medications for this visit.     OBJECTIVE: Vitals:   10/06/15 1438  BP: 122/72  Pulse: 98  Resp: 18  Temp: (!) 95 F (35 C)     Body mass index is 20.23  kg/m.    ECOG FS:1 - Symptomatic but completely ambulatory  General: Well-developed, well-nourished, no acute distress. Eyes: anicteric sclera. Lungs: Clear to auscultation bilaterally. Heart: Regular rate and rhythm. No rubs, murmurs, or gallops. Abdomen: Soft, nontender, nondistended. No organomegaly noted, normoactive bowel sounds. Musculoskeletal: No edema, cyanosis, or clubbing. Neuro: Alert, answering all questions appropriately. Cranial nerves grossly intact. Skin: No rashes or  petechiae noted. Psych: Normal affect.   LAB RESULTS:  Lab Results  Component Value Date   NA 133 (L) 10/06/2015   K 4.4 10/06/2015   CL 100 (L) 10/06/2015   CO2 24 10/06/2015   GLUCOSE 180 (H) 10/06/2015   BUN 26 (H) 10/06/2015   CREATININE 1.51 (H) 10/06/2015   CALCIUM 9.6 10/06/2015   PROT 8.1 10/06/2015   ALBUMIN 4.1 10/06/2015   AST 81 (H) 10/06/2015   ALT 68 (H) 10/06/2015   ALKPHOS 97 10/06/2015   BILITOT 0.9 10/06/2015   GFRNONAA 39 (L) 10/06/2015   GFRAA 45 (L) 10/06/2015    Lab Results  Component Value Date   WBC 6.6 10/06/2015   NEUTROABS 4.9 10/06/2015   HGB 8.4 (L) 10/06/2015   HCT 24.0 (L) 10/06/2015   MCV 90.9 10/06/2015   PLT 198 10/06/2015   Lab Results  Component Value Date   TOTALPROTELP 6.9 09/22/2015   ALBUMINELP 3.4 09/22/2015   A1GS 0.3 09/22/2015   A2GS 1.1 (H) 09/22/2015   BETS 1.2 09/22/2015   GAMS 0.9 09/22/2015   MSPIKE 0.4 (H) 09/22/2015   SPEI Comment 09/22/2015     STUDIES: Dg Chest 2 View  Result Date: 09/22/2015 CLINICAL DATA:  50 year old female with history of cough for a couple of days. Multiple myeloma. EXAM: CHEST  2 VIEW COMPARISON:  Chest CT 09/02/2015. FINDINGS: Mild diffuse peribronchial cuffing. Lung volumes are low. No consolidative airspace disease. No pleural effusions. No pneumothorax. No pulmonary nodule or mass noted. Pulmonary vasculature and the cardiomediastinal silhouette are within normal limits. Right-sided internal jugular single-lumen porta cath with tip terminating at the superior cavoatrial junction. Multiple healed rib fractures bilaterally. IMPRESSION: 1. Mild diffuse peribronchial cuffing, suggesting an acute bronchitis. Electronically Signed   By: Vinnie Langton M.D.   On: 09/22/2015 17:30    ASSESSMENT: Multiple Myeloma in relapse with multiple bony lytic lesions.   PLAN:    1. Multiple Myeloma in relapse with multiple bony lytic lesions: MRI, CT, bone scan results reviewed independently  indicating progression of disease. Patient's M spike has trended up slightly to 0.4. Continue daily XRT. Proceed with cycle 2 of single agent Velcade today on days 1, 4, 8, and 11. Will also continue Zometa every 6 weeks on day 1 of the odd numbered cycles. Dexamethasone has been discontinued given her difficult to control blood sugar. Because of patient's immigration status, she could not undergo transplant. Return to clinic for Velcade and then in 3 weeks for consideration of cycle 3. 2. Back pain:  XRT as above. She was recently given a prescription for oxycodone. 3. Renal insufficiency: Mild, monitor. 4. Anemia: Monitor. 5. Hyperglycemia: Patient's blood glucose has improved since initiating insulin. Continue monitoring and treatment by primary care.   The entire visit was done in the presence of an interpreter.  Patient expressed understanding and was in agreement with this plan. She also understands that She can call clinic at any time with any questions, concerns, or complaints.    Lloyd Huger, MD   10/06/2015 5:31 PM

## 2015-10-06 ENCOUNTER — Encounter: Payer: Self-pay | Admitting: Oncology

## 2015-10-06 ENCOUNTER — Inpatient Hospital Stay: Payer: Self-pay

## 2015-10-06 ENCOUNTER — Inpatient Hospital Stay (HOSPITAL_BASED_OUTPATIENT_CLINIC_OR_DEPARTMENT_OTHER): Payer: Self-pay | Admitting: Oncology

## 2015-10-06 VITALS — BP 122/72 | HR 98 | Temp 95.0°F | Resp 18 | Ht 63.0 in | Wt 114.2 lb

## 2015-10-06 DIAGNOSIS — E119 Type 2 diabetes mellitus without complications: Secondary | ICD-10-CM

## 2015-10-06 DIAGNOSIS — N2889 Other specified disorders of kidney and ureter: Secondary | ICD-10-CM

## 2015-10-06 DIAGNOSIS — C9002 Multiple myeloma in relapse: Secondary | ICD-10-CM

## 2015-10-06 DIAGNOSIS — R739 Hyperglycemia, unspecified: Secondary | ICD-10-CM

## 2015-10-06 DIAGNOSIS — D649 Anemia, unspecified: Secondary | ICD-10-CM

## 2015-10-06 DIAGNOSIS — R531 Weakness: Secondary | ICD-10-CM

## 2015-10-06 DIAGNOSIS — F329 Major depressive disorder, single episode, unspecified: Secondary | ICD-10-CM

## 2015-10-06 DIAGNOSIS — C9001 Multiple myeloma in remission: Secondary | ICD-10-CM

## 2015-10-06 DIAGNOSIS — Z79899 Other long term (current) drug therapy: Secondary | ICD-10-CM

## 2015-10-06 DIAGNOSIS — M549 Dorsalgia, unspecified: Secondary | ICD-10-CM

## 2015-10-06 DIAGNOSIS — H578 Other specified disorders of eye and adnexa: Secondary | ICD-10-CM

## 2015-10-06 DIAGNOSIS — M1990 Unspecified osteoarthritis, unspecified site: Secondary | ICD-10-CM

## 2015-10-06 DIAGNOSIS — R5383 Other fatigue: Secondary | ICD-10-CM

## 2015-10-06 LAB — CBC WITH DIFFERENTIAL/PLATELET
Basophils Absolute: 0 10*3/uL (ref 0–0.1)
Basophils Relative: 1 %
Eosinophils Absolute: 0 10*3/uL (ref 0–0.7)
Eosinophils Relative: 0 %
HEMATOCRIT: 24 % — AB (ref 35.0–47.0)
HEMOGLOBIN: 8.4 g/dL — AB (ref 12.0–16.0)
LYMPHS ABS: 0.6 10*3/uL — AB (ref 1.0–3.6)
LYMPHS PCT: 9 %
MCH: 31.9 pg (ref 26.0–34.0)
MCHC: 35.1 g/dL (ref 32.0–36.0)
MCV: 90.9 fL (ref 80.0–100.0)
MONO ABS: 1.1 10*3/uL — AB (ref 0.2–0.9)
MONOS PCT: 17 %
NEUTROS ABS: 4.9 10*3/uL (ref 1.4–6.5)
NEUTROS PCT: 73 %
Platelets: 198 10*3/uL (ref 150–440)
RBC: 2.64 MIL/uL — ABNORMAL LOW (ref 3.80–5.20)
RDW: 16.1 % — ABNORMAL HIGH (ref 11.5–14.5)
WBC: 6.6 10*3/uL (ref 3.6–11.0)

## 2015-10-06 LAB — COMPREHENSIVE METABOLIC PANEL
ALBUMIN: 4.1 g/dL (ref 3.5–5.0)
ALK PHOS: 97 U/L (ref 38–126)
ALT: 68 U/L — ABNORMAL HIGH (ref 14–54)
ANION GAP: 9 (ref 5–15)
AST: 81 U/L — ABNORMAL HIGH (ref 15–41)
BILIRUBIN TOTAL: 0.9 mg/dL (ref 0.3–1.2)
BUN: 26 mg/dL — ABNORMAL HIGH (ref 6–20)
CALCIUM: 9.6 mg/dL (ref 8.9–10.3)
CO2: 24 mmol/L (ref 22–32)
Chloride: 100 mmol/L — ABNORMAL LOW (ref 101–111)
Creatinine, Ser: 1.51 mg/dL — ABNORMAL HIGH (ref 0.44–1.00)
GFR calc Af Amer: 45 mL/min — ABNORMAL LOW (ref >60)
GFR, EST NON AFRICAN AMERICAN: 39 mL/min — AB (ref >60)
GLUCOSE: 180 mg/dL — AB (ref 65–99)
Potassium: 4.4 mmol/L (ref 3.5–5.1)
Sodium: 133 mmol/L — ABNORMAL LOW (ref 135–145)
TOTAL PROTEIN: 8.1 g/dL (ref 6.5–8.1)

## 2015-10-06 MED ORDER — BORTEZOMIB CHEMO SQ INJECTION 3.5 MG (2.5MG/ML)
1.3000 mg/m2 | Freq: Once | INTRAMUSCULAR | Status: AC
  Administered 2015-10-06: 2 mg via SUBCUTANEOUS
  Filled 2015-10-06: qty 2

## 2015-10-06 MED ORDER — PROCHLORPERAZINE MALEATE 10 MG PO TABS
10.0000 mg | ORAL_TABLET | Freq: Once | ORAL | Status: AC
  Administered 2015-10-06: 10 mg via ORAL
  Filled 2015-10-06: qty 1

## 2015-10-06 MED ORDER — MEGESTROL ACETATE 40 MG PO TABS: 40.0000 mg | ORAL_TABLET | Freq: Every day | ORAL | 2 refills | Status: DC

## 2015-10-06 NOTE — Progress Notes (Signed)
Pt reports decreased appetite at %5 and with nausea and weight loss.  Nothing taste good, either  tastes bitter or has no taste.

## 2015-10-07 LAB — PROTEIN ELECTROPHORESIS, SERUM
A/G RATIO SPE: 0.9 (ref 0.7–1.7)
Albumin ELP: 3.4 g/dL (ref 2.9–4.4)
Alpha-1-Globulin: 0.3 g/dL (ref 0.0–0.4)
Alpha-2-Globulin: 1.1 g/dL — ABNORMAL HIGH (ref 0.4–1.0)
BETA GLOBULIN: 1.3 g/dL (ref 0.7–1.3)
GAMMA GLOBULIN: 0.9 g/dL (ref 0.4–1.8)
Globulin, Total: 3.6 g/dL (ref 2.2–3.9)
M-Spike, %: 0.3 g/dL — ABNORMAL HIGH
TOTAL PROTEIN ELP: 7 g/dL (ref 6.0–8.5)

## 2015-10-09 ENCOUNTER — Ambulatory Visit: Payer: Self-pay

## 2015-10-09 ENCOUNTER — Inpatient Hospital Stay: Payer: Self-pay

## 2015-10-13 ENCOUNTER — Inpatient Hospital Stay: Payer: Self-pay

## 2015-10-13 ENCOUNTER — Ambulatory Visit: Payer: Self-pay

## 2015-10-13 ENCOUNTER — Other Ambulatory Visit: Payer: Self-pay

## 2015-10-16 ENCOUNTER — Other Ambulatory Visit: Payer: Self-pay | Admitting: Hematology and Oncology

## 2015-10-16 ENCOUNTER — Inpatient Hospital Stay: Payer: Self-pay

## 2015-10-16 ENCOUNTER — Inpatient Hospital Stay: Payer: Self-pay | Attending: Oncology

## 2015-10-16 VITALS — BP 108/63 | HR 102 | Temp 99.8°F | Resp 18

## 2015-10-16 DIAGNOSIS — M549 Dorsalgia, unspecified: Secondary | ICD-10-CM | POA: Insufficient documentation

## 2015-10-16 DIAGNOSIS — I10 Essential (primary) hypertension: Secondary | ICD-10-CM | POA: Insufficient documentation

## 2015-10-16 DIAGNOSIS — R630 Anorexia: Secondary | ICD-10-CM | POA: Insufficient documentation

## 2015-10-16 DIAGNOSIS — Z5111 Encounter for antineoplastic chemotherapy: Secondary | ICD-10-CM | POA: Insufficient documentation

## 2015-10-16 DIAGNOSIS — C9001 Multiple myeloma in remission: Secondary | ICD-10-CM

## 2015-10-16 DIAGNOSIS — C9000 Multiple myeloma not having achieved remission: Secondary | ICD-10-CM

## 2015-10-16 DIAGNOSIS — N2889 Other specified disorders of kidney and ureter: Secondary | ICD-10-CM | POA: Insufficient documentation

## 2015-10-16 DIAGNOSIS — M1990 Unspecified osteoarthritis, unspecified site: Secondary | ICD-10-CM | POA: Insufficient documentation

## 2015-10-16 DIAGNOSIS — R531 Weakness: Secondary | ICD-10-CM | POA: Insufficient documentation

## 2015-10-16 DIAGNOSIS — C9002 Multiple myeloma in relapse: Secondary | ICD-10-CM | POA: Insufficient documentation

## 2015-10-16 DIAGNOSIS — F329 Major depressive disorder, single episode, unspecified: Secondary | ICD-10-CM | POA: Insufficient documentation

## 2015-10-16 DIAGNOSIS — R5383 Other fatigue: Secondary | ICD-10-CM | POA: Insufficient documentation

## 2015-10-16 DIAGNOSIS — Z79899 Other long term (current) drug therapy: Secondary | ICD-10-CM | POA: Insufficient documentation

## 2015-10-16 DIAGNOSIS — R739 Hyperglycemia, unspecified: Secondary | ICD-10-CM | POA: Insufficient documentation

## 2015-10-16 DIAGNOSIS — F419 Anxiety disorder, unspecified: Secondary | ICD-10-CM | POA: Insufficient documentation

## 2015-10-16 LAB — COMPREHENSIVE METABOLIC PANEL
ALT: 51 U/L (ref 14–54)
AST: 68 U/L — AB (ref 15–41)
Albumin: 3.4 g/dL — ABNORMAL LOW (ref 3.5–5.0)
Alkaline Phosphatase: 88 U/L (ref 38–126)
Anion gap: 9 (ref 5–15)
BILIRUBIN TOTAL: 0.5 mg/dL (ref 0.3–1.2)
BUN: 33 mg/dL — AB (ref 6–20)
CALCIUM: 10 mg/dL (ref 8.9–10.3)
CO2: 18 mmol/L — ABNORMAL LOW (ref 22–32)
CREATININE: 1.38 mg/dL — AB (ref 0.44–1.00)
Chloride: 110 mmol/L (ref 101–111)
GFR, EST AFRICAN AMERICAN: 50 mL/min — AB (ref >60)
GFR, EST NON AFRICAN AMERICAN: 43 mL/min — AB (ref >60)
Glucose, Bld: 218 mg/dL — ABNORMAL HIGH (ref 65–99)
Potassium: 4 mmol/L (ref 3.5–5.1)
Sodium: 137 mmol/L (ref 135–145)
TOTAL PROTEIN: 7.2 g/dL (ref 6.5–8.1)

## 2015-10-16 LAB — ABO/RH: ABO/RH(D): B POS

## 2015-10-16 LAB — CBC WITH DIFFERENTIAL/PLATELET
BASOS ABS: 0 10*3/uL (ref 0–0.1)
Basophils Relative: 1 %
EOS PCT: 1 %
Eosinophils Absolute: 0.1 10*3/uL (ref 0–0.7)
HEMATOCRIT: 20.4 % — AB (ref 35.0–47.0)
Hemoglobin: 7.1 g/dL — ABNORMAL LOW (ref 12.0–16.0)
LYMPHS ABS: 0.7 10*3/uL — AB (ref 1.0–3.6)
LYMPHS PCT: 15 %
MCH: 32.2 pg (ref 26.0–34.0)
MCHC: 34.9 g/dL (ref 32.0–36.0)
MCV: 92.2 fL (ref 80.0–100.0)
MONO ABS: 0.8 10*3/uL (ref 0.2–0.9)
Monocytes Relative: 17 %
NEUTROS ABS: 3.3 10*3/uL (ref 1.4–6.5)
Neutrophils Relative %: 66 %
Platelets: 147 10*3/uL — ABNORMAL LOW (ref 150–440)
RBC: 2.21 MIL/uL — AB (ref 3.80–5.20)
RDW: 17.1 % — ABNORMAL HIGH (ref 11.5–14.5)
WBC: 5 10*3/uL (ref 3.6–11.0)

## 2015-10-16 LAB — PREPARE RBC (CROSSMATCH)

## 2015-10-16 MED ORDER — HEPARIN SOD (PORK) LOCK FLUSH 100 UNIT/ML IV SOLN
500.0000 [IU] | Freq: Once | INTRAVENOUS | Status: AC | PRN
  Administered 2015-10-16: 500 [IU]
  Filled 2015-10-16: qty 5

## 2015-10-16 MED ORDER — SODIUM CHLORIDE 0.9 % IV SOLN
Freq: Once | INTRAVENOUS | Status: AC
  Administered 2015-10-16: 10:00:00 via INTRAVENOUS
  Filled 2015-10-16: qty 1000

## 2015-10-16 MED ORDER — PROCHLORPERAZINE MALEATE 10 MG PO TABS
10.0000 mg | ORAL_TABLET | Freq: Once | ORAL | Status: AC
  Administered 2015-10-16: 10 mg via ORAL
  Filled 2015-10-16: qty 1

## 2015-10-16 MED ORDER — ZOLEDRONIC ACID 4 MG/5ML IV CONC
3.3000 mg | Freq: Once | INTRAVENOUS | Status: DC
  Filled 2015-10-16: qty 4.13

## 2015-10-16 MED ORDER — ACETAMINOPHEN 325 MG PO TABS
650.0000 mg | ORAL_TABLET | Freq: Once | ORAL | Status: AC
  Administered 2015-10-16: 650 mg via ORAL
  Filled 2015-10-16: qty 2

## 2015-10-16 MED ORDER — BORTEZOMIB CHEMO SQ INJECTION 3.5 MG (2.5MG/ML)
1.3000 mg/m2 | Freq: Once | INTRAMUSCULAR | Status: AC
  Administered 2015-10-16: 2 mg via SUBCUTANEOUS
  Filled 2015-10-16: qty 2

## 2015-10-16 MED ORDER — DIPHENHYDRAMINE HCL 25 MG PO CAPS
25.0000 mg | ORAL_CAPSULE | Freq: Once | ORAL | Status: AC
  Administered 2015-10-16: 25 mg via ORAL
  Filled 2015-10-16: qty 1

## 2015-10-16 MED ORDER — SODIUM CHLORIDE 0.9 % IJ SOLN
10.0000 mL | INTRAMUSCULAR | Status: DC | PRN
  Filled 2015-10-16: qty 10

## 2015-10-17 ENCOUNTER — Telehealth: Payer: Self-pay | Admitting: *Deleted

## 2015-10-17 LAB — TYPE AND SCREEN
ABO/RH(D): B POS
Antibody Screen: NEGATIVE
Unit division: 0
Unit division: 0

## 2015-10-17 LAB — PROTEIN ELECTROPHORESIS, SERUM
A/G Ratio: 1 (ref 0.7–1.7)
ALPHA-1-GLOBULIN: 0.3 g/dL (ref 0.0–0.4)
ALPHA-2-GLOBULIN: 1.1 g/dL — AB (ref 0.4–1.0)
Albumin ELP: 3.3 g/dL (ref 2.9–4.4)
Beta Globulin: 1.2 g/dL (ref 0.7–1.3)
GAMMA GLOBULIN: 0.8 g/dL (ref 0.4–1.8)
Globulin, Total: 3.4 g/dL (ref 2.2–3.9)
M-SPIKE, %: 0.3 g/dL — AB
TOTAL PROTEIN ELP: 6.7 g/dL (ref 6.0–8.5)

## 2015-10-17 NOTE — Telephone Encounter (Signed)
Via the interpreter, pt has experienced 2 episodes of diarrhea this morning. Via interpreter, pt instructed to take imodium and to call back if has not helped relieve diarrhea.

## 2015-10-23 ENCOUNTER — Ambulatory Visit: Payer: Self-pay | Admitting: Radiation Oncology

## 2015-10-26 NOTE — Progress Notes (Signed)
[place]  [phone] [phone]) [phone]  ID: [name] OB: March 11, 1965  MR#: [id number]  WCB#:[id number]  [name], MD as PCP - General (Family Medicine)  CHIEF COMPLAINT: Multiple Myeloma in relapse with multiple bony lytic lesions.   INTERVAL HISTORY: Patient returns to clinic today for further evaluation and cycle 3 of Velcade. Her back pain has improved since starting XRT, but still evident. Her appetite has improved. She does not complain of any further nausea or vomiting. She denies any recent fevers. She denies any chest pain or shortness of breath.  She denies any constipation or diarrhea. She denies any other pain. She has no urinary complaints. Patient offers no further specific complaints today.  REVIEW OF SYSTEMS:   Review of Systems  Constitutional: Positive for malaise/fatigue and weight loss. Negative for fever.  Respiratory: Negative.  Negative for cough and shortness of breath.   Cardiovascular: Negative.  Negative for chest pain.  Gastrointestinal: Negative.  Negative for abdominal pain, nausea and vomiting.  Musculoskeletal: Positive for back pain.  Neurological: Positive for weakness. Negative for tingling and focal weakness.  Psychiatric/Behavioral: Positive for depression. The patient is nervous/anxious.     As per HPI. Otherwise, a complete review of systems is negatve.  PAST MEDICAL HISTORY: Past Medical History:  Diagnosis Date  . Hypertension   . Multiple myeloma ([place])   . Osteoarthritis   . Post-menopausal 2014    PAST SURGICAL HISTORY: Past Surgical History:  Procedure Laterality Date  . CESAREAN SECTION     x2    FAMILY HISTORY: Reviewed and unchanged. No reported history of malignancy or chronic disease.     ADVANCED DIRECTIVES:    HEALTH MAINTENANCE: Social History  Substance Use Topics  . Smoking status: Never Smoker  . Smokeless tobacco: Never Used  .  Alcohol use No     Allergies  Allergen Reactions  . No Known Allergies     Current Outpatient Prescriptions  Medication Sig Dispense Refill  . megestrol (MEGACE) 40 MG tablet Take 1 tablet (40 mg total) by mouth daily. 30 tablet 2  . Oxycodone HCl 10 MG TABS Take 1 tablet (10 mg total) by mouth every 6 (six) hours as needed. 60 tablet 0  . verapamil (CALAN) 80 MG tablet Take 80 mg by mouth.    . sucralfate (CARAFATE) 1 g tablet Take 1 tablet (1 g total) by mouth 2 (two) times daily. (Patient not taking: Reported on 10/06/2015) 60 tablet 3  . Vitamin D, Cholecalciferol, 400 UNITS CAPS Take 400 mg by mouth 2 (two) times daily. Reported on 07/31/2015     No current facility-administered medications for this visit.    Facility-Administered Medications Ordered in Other Visits  Medication Dose Route Frequency Provider Last Rate Last Dose  . heparin lock flush 100 unit/mL  500 Units Intravenous Once Lloyd Huger, MD      . sodium chloride flush (NS) 0.9 % injection 10 mL  10 mL Intravenous PRN Lloyd Huger, MD        OBJECTIVE: Vitals:   10/27/15 0914  BP: 117/61  Pulse: 95  Temp: 97.3 F (36.3 C)     Body mass index is 20.42 kg/m.    ECOG FS:1 - Symptomatic but completely ambulatory  General: Well-developed, well-nourished, no acute distress. Eyes: anicteric sclera. Lungs: Clear to auscultation bilaterally. Heart: Regular rate and rhythm. No rubs, murmurs, or gallops. Abdomen: Soft, nontender, nondistended. No organomegaly noted, normoactive bowel sounds.  Musculoskeletal: No edema, cyanosis, or clubbing. Neuro: Alert, answering all questions appropriately. Cranial nerves grossly intact. Skin: No rashes or petechiae noted. Psych: Normal affect.   LAB RESULTS:  Lab Results  Component Value Date   NA 136 10/27/2015   K 3.9 10/27/2015   CL 111 10/27/2015   CO2 15 (L) 10/27/2015   GLUCOSE 135 (H) 10/27/2015   BUN 35 (H) 10/27/2015   CREATININE 1.51 (H)  10/27/2015   CALCIUM 9.8 10/27/2015   PROT 7.5 10/27/2015   ALBUMIN 3.5 10/27/2015   AST 106 (H) 10/27/2015   ALT 111 (H) 10/27/2015   ALKPHOS 103 10/27/2015   BILITOT 0.9 10/27/2015   GFRNONAA 39 (L) 10/27/2015   GFRAA 45 (L) 10/27/2015    Lab Results  Component Value Date   WBC 5.1 10/27/2015   NEUTROABS 3.5 10/27/2015   HGB 9.4 (L) 10/27/2015   HCT 26.9 (L) 10/27/2015   MCV 87.8 10/27/2015   PLT 140 (L) 10/27/2015   Lab Results  Component Value Date   TOTALPROTELP 6.7 10/16/2015   ALBUMINELP 3.3 10/16/2015   A1GS 0.3 10/16/2015   A2GS 1.1 (H) 10/16/2015   BETS 1.2 10/16/2015   GAMS 0.8 10/16/2015   MSPIKE 0.3 (H) 10/16/2015   SPEI Comment 10/16/2015     STUDIES: No results found.  ASSESSMENT: Multiple Myeloma in relapse with multiple bony lytic lesions.   PLAN:    1. Multiple Myeloma in relapse with multiple bony lytic lesions: MRI, CT, bone scan results reviewed independently indicating progression of disease. Patient's M spike is now 0.3. Kappa and lambda light chains are pending at time of dictation. Patient has now completed XRT. Proceed with cycle 3 of single agent Velcade today on days 1, 4, 8, and 11. Will also continue Zometa every 6 weeks on day 1 of the odd numbered cycles. Dexamethasone has been discontinued given her difficult to control blood sugar. Because of patient's immigration status, she could not undergo transplant. Return to clinic for Velcade and then in 3 weeks for consideration of cycle 4. 2. Back pain:  XRT as above. She was recently given a prescription for oxycodone. 3. Renal insufficiency: Mild, monitor. 4. Anemia: Monitor. 5. Hyperglycemia: Patient's blood glucose has improved since initiating insulin. Continue monitoring and treatment by primary care.   The entire visit was done in the presence of an interpreter.  Patient expressed understanding and was in agreement with this plan. She also understands that She can call clinic at any  time with any questions, concerns, or complaints.    Lloyd Huger, MD   10/27/2015 3:50 PM

## 2015-10-27 ENCOUNTER — Inpatient Hospital Stay: Payer: Self-pay

## 2015-10-27 ENCOUNTER — Encounter: Payer: Self-pay | Admitting: Oncology

## 2015-10-27 ENCOUNTER — Inpatient Hospital Stay (HOSPITAL_BASED_OUTPATIENT_CLINIC_OR_DEPARTMENT_OTHER): Payer: Self-pay | Admitting: Oncology

## 2015-10-27 ENCOUNTER — Inpatient Hospital Stay: Payer: Self-pay | Admitting: Internal Medicine

## 2015-10-27 ENCOUNTER — Ambulatory Visit
Admission: RE | Admit: 2015-10-27 | Discharge: 2015-10-27 | Disposition: A | Discharge status: Home / Self Care | Payer: Self-pay | Source: Ambulatory Visit | Attending: Radiation Oncology | Admitting: Radiation Oncology

## 2015-10-27 ENCOUNTER — Encounter (INDEPENDENT_AMBULATORY_CARE_PROVIDER_SITE_OTHER): Payer: Self-pay

## 2015-10-27 VITALS — BP 117/61 | HR 95 | Temp 97.3°F | Wt 115.3 lb

## 2015-10-27 DIAGNOSIS — F419 Anxiety disorder, unspecified: Secondary | ICD-10-CM

## 2015-10-27 DIAGNOSIS — M1990 Unspecified osteoarthritis, unspecified site: Secondary | ICD-10-CM

## 2015-10-27 DIAGNOSIS — R531 Weakness: Secondary | ICD-10-CM

## 2015-10-27 DIAGNOSIS — C9000 Multiple myeloma not having achieved remission: Secondary | ICD-10-CM | POA: Insufficient documentation

## 2015-10-27 DIAGNOSIS — C9002 Multiple myeloma in relapse: Secondary | ICD-10-CM

## 2015-10-27 DIAGNOSIS — R630 Anorexia: Secondary | ICD-10-CM

## 2015-10-27 DIAGNOSIS — R739 Hyperglycemia, unspecified: Secondary | ICD-10-CM

## 2015-10-27 DIAGNOSIS — Z923 Personal history of irradiation: Secondary | ICD-10-CM | POA: Insufficient documentation

## 2015-10-27 DIAGNOSIS — R5383 Other fatigue: Secondary | ICD-10-CM

## 2015-10-27 DIAGNOSIS — M549 Dorsalgia, unspecified: Secondary | ICD-10-CM

## 2015-10-27 DIAGNOSIS — C9001 Multiple myeloma in remission: Secondary | ICD-10-CM

## 2015-10-27 DIAGNOSIS — Z79899 Other long term (current) drug therapy: Secondary | ICD-10-CM

## 2015-10-27 DIAGNOSIS — N2889 Other specified disorders of kidney and ureter: Secondary | ICD-10-CM

## 2015-10-27 DIAGNOSIS — G893 Neoplasm related pain (acute) (chronic): Secondary | ICD-10-CM | POA: Insufficient documentation

## 2015-10-27 DIAGNOSIS — I10 Essential (primary) hypertension: Secondary | ICD-10-CM

## 2015-10-27 DIAGNOSIS — F329 Major depressive disorder, single episode, unspecified: Secondary | ICD-10-CM

## 2015-10-27 LAB — COMPREHENSIVE METABOLIC PANEL
ALBUMIN: 3.5 g/dL (ref 3.5–5.0)
ALK PHOS: 103 U/L (ref 38–126)
ALT: 111 U/L — AB (ref 14–54)
ANION GAP: 10 (ref 5–15)
AST: 106 U/L — ABNORMAL HIGH (ref 15–41)
BILIRUBIN TOTAL: 0.9 mg/dL (ref 0.3–1.2)
BUN: 35 mg/dL — ABNORMAL HIGH (ref 6–20)
CALCIUM: 9.8 mg/dL (ref 8.9–10.3)
CO2: 15 mmol/L — AB (ref 22–32)
CREATININE: 1.51 mg/dL — AB (ref 0.44–1.00)
Chloride: 111 mmol/L (ref 101–111)
GFR calc Af Amer: 45 mL/min — ABNORMAL LOW (ref >60)
GFR calc non Af Amer: 39 mL/min — ABNORMAL LOW (ref >60)
GLUCOSE: 135 mg/dL — AB (ref 65–99)
Potassium: 3.9 mmol/L (ref 3.5–5.1)
SODIUM: 136 mmol/L (ref 135–145)
TOTAL PROTEIN: 7.5 g/dL (ref 6.5–8.1)

## 2015-10-27 LAB — CBC WITH DIFFERENTIAL/PLATELET
BASOS PCT: 1 %
Basophils Absolute: 0 10*3/uL (ref 0–0.1)
EOS ABS: 0 10*3/uL (ref 0–0.7)
Eosinophils Relative: 1 %
HCT: 26.9 % — ABNORMAL LOW (ref 35.0–47.0)
Hemoglobin: 9.4 g/dL — ABNORMAL LOW (ref 12.0–16.0)
Lymphocytes Relative: 15 %
Lymphs Abs: 0.8 10*3/uL — ABNORMAL LOW (ref 1.0–3.6)
MCH: 30.8 pg (ref 26.0–34.0)
MCHC: 35.1 g/dL (ref 32.0–36.0)
MCV: 87.8 fL (ref 80.0–100.0)
MONO ABS: 0.8 10*3/uL (ref 0.2–0.9)
MONOS PCT: 16 %
Neutro Abs: 3.5 10*3/uL (ref 1.4–6.5)
Neutrophils Relative %: 67 %
PLATELETS: 140 10*3/uL — AB (ref 150–440)
RBC: 3.06 MIL/uL — ABNORMAL LOW (ref 3.80–5.20)
RDW: 16.2 % — AB (ref 11.5–14.5)
WBC: 5.1 10*3/uL (ref 3.6–11.0)

## 2015-10-27 MED ORDER — HEPARIN SOD (PORK) LOCK FLUSH 100 UNIT/ML IV SOLN
500.0000 [IU] | Freq: Once | INTRAVENOUS | Status: AC
  Administered 2015-10-27: 500 [IU] via INTRAVENOUS
  Filled 2015-10-27: qty 5

## 2015-10-27 MED ORDER — ZOLEDRONIC ACID 4 MG/5ML IV CONC
3.5000 mg | Freq: Once | INTRAVENOUS | Status: AC
  Administered 2015-10-27: 3.5 mg via INTRAVENOUS
  Filled 2015-10-27: qty 4.38

## 2015-10-27 MED ORDER — BORTEZOMIB CHEMO SQ INJECTION 3.5 MG (2.5MG/ML)
1.3000 mg/m2 | Freq: Once | INTRAMUSCULAR | Status: AC
  Administered 2015-10-27: 2 mg via SUBCUTANEOUS
  Filled 2015-10-27: qty 2

## 2015-10-27 MED ORDER — SODIUM CHLORIDE 0.9% FLUSH
10.0000 mL | INTRAVENOUS | Status: DC | PRN
  Filled 2015-10-27: qty 10

## 2015-10-27 MED ORDER — SODIUM CHLORIDE 0.9 % IV SOLN
Freq: Once | INTRAVENOUS | Status: AC
  Administered 2015-10-27: 10:00:00 via INTRAVENOUS
  Filled 2015-10-27: qty 1000

## 2015-10-27 MED ORDER — PROCHLORPERAZINE MALEATE 10 MG PO TABS
10.0000 mg | ORAL_TABLET | Freq: Once | ORAL | Status: AC
  Administered 2015-10-27: 10 mg via ORAL
  Filled 2015-10-27: qty 1

## 2015-10-27 NOTE — Progress Notes (Signed)
Radiation Oncology Follow up Note  Name: Jamie Keller   Date:   10/27/2015 MRN:  [id number] DOB: 08/26/1965    This 50 y.o. female presents to the clinic today for one-month follow-up for palliative radiation therapy for multiple myeloma. With radiation to her spine  REFERRING PROVIDER: Patel, Sarah, MD  HPI: Patient is a 50-year-old Spanish female with multiple myeloma is received multiple courses of palliative radiation therapy for her disease. [name] now 1 month out having completed retreatment to her spine. She still having some significant pain no problems with motor or sensory levels. She does ambulate well.. She is currently receiving Zometa and Velcade which is tolerating well. Her dexamethasone has been stopped second to her problems with controlling her blood sugar levels. She is seen today accompanied by interpreter.  COMPLICATIONS OF TREATMENT: none  FOLLOW UP COMPLIANCE: keeps appointments   PHYSICAL EXAM:  There were no vitals taken for this visit. No pain is elicited on deep palpation of her spine motor sensory and DTR levels are equal and symmetric in upper lower extremities. Well-developed well-nourished patient in NAD. HEENT reveals PERLA, EOMI, discs not visualized.  Oral cavity is clear. No oral mucosal lesions are identified. Neck is clear without evidence of cervical or supraclavicular adenopathy. Lungs are clear to A&P. Cardiac examination is essentially unremarkable with regular rate and rhythm without murmur rub or thrill. Abdomen is benign with no organomegaly or masses noted. Motor sensory and DTR levels are equal and symmetric in the upper and lower extremities. Cranial nerves II through XII are grossly intact. Proprioception is intact. No peripheral adenopathy or edema is identified. No motor or sensory levels are noted. Crude visual fields are within normal range.  RADIOLOGY RESULTS: No current films for review  PLAN: At the present time patient is doing  doing fair. She continues under medical oncology's direction with her Velcade and Zometa. I will turn follow-up care over to medical oncology would be happy to reevaluate the patient any time should further treatment be indicated.  I would like to take this opportunity to thank you for allowing me to participate in the care of your patient..    CHRYSTAL,GLENN S., MD   

## 2015-10-30 ENCOUNTER — Inpatient Hospital Stay: Payer: Self-pay

## 2015-10-30 VITALS — BP 130/73 | HR 103 | Temp 98.8°F | Resp 18

## 2015-10-30 DIAGNOSIS — C9002 Multiple myeloma in relapse: Secondary | ICD-10-CM

## 2015-10-30 MED ORDER — BORTEZOMIB CHEMO SQ INJECTION 3.5 MG (2.5MG/ML)
1.3000 mg/m2 | Freq: Once | INTRAMUSCULAR | Status: AC
  Administered 2015-10-30: 2 mg via SUBCUTANEOUS
  Filled 2015-10-30: qty 2

## 2015-10-30 MED ORDER — PROCHLORPERAZINE MALEATE 10 MG PO TABS
10.0000 mg | ORAL_TABLET | Freq: Once | ORAL | Status: AC
  Administered 2015-10-30: 10 mg via ORAL
  Filled 2015-10-30: qty 1

## 2015-10-31 ENCOUNTER — Telehealth: Payer: Self-pay | Admitting: *Deleted

## 2015-10-31 NOTE — Telephone Encounter (Signed)
Per Leola Brazil the interpreter, pt develops diarrhea after each treatment and takes 2 imodium then develops constipation until next treatment. Pt was instructed to only take 1 imodium after episode of diarrhea and if develops constipation may take 1 stool softener per day. Pt instructed via interpreter and informed to callback over the weekend if has further questions or concerns.

## 2015-11-03 ENCOUNTER — Other Ambulatory Visit: Payer: Self-pay | Admitting: *Deleted

## 2015-11-03 ENCOUNTER — Inpatient Hospital Stay: Payer: Self-pay

## 2015-11-03 DIAGNOSIS — C9002 Multiple myeloma in relapse: Secondary | ICD-10-CM

## 2015-11-03 DIAGNOSIS — D649 Anemia, unspecified: Secondary | ICD-10-CM

## 2015-11-03 LAB — CBC WITH DIFFERENTIAL/PLATELET
Basophils Absolute: 0 10*3/uL (ref 0–0.1)
Basophils Relative: 0 %
EOS ABS: 0 10*3/uL (ref 0–0.7)
Eosinophils Relative: 1 %
HEMATOCRIT: 27.2 % — AB (ref 35.0–47.0)
HEMOGLOBIN: 9.4 g/dL — AB (ref 12.0–16.0)
LYMPHS ABS: 0.7 10*3/uL — AB (ref 1.0–3.6)
MCH: 30.6 pg (ref 26.0–34.0)
MCHC: 34.8 g/dL (ref 32.0–36.0)
MCV: 88.2 fL (ref 80.0–100.0)
Monocytes Absolute: 0.7 10*3/uL (ref 0.2–0.9)
Neutro Abs: 3.6 10*3/uL (ref 1.4–6.5)
Platelets: 49 10*3/uL — ABNORMAL LOW (ref 150–440)
RBC: 3.08 MIL/uL — AB (ref 3.80–5.20)
RDW: 16.6 % — ABNORMAL HIGH (ref 11.5–14.5)
WBC: 5.1 10*3/uL (ref 3.6–11.0)

## 2015-11-03 LAB — COMPREHENSIVE METABOLIC PANEL
ALK PHOS: 96 U/L (ref 38–126)
ALT: 55 U/L — AB (ref 14–54)
ANION GAP: 10 (ref 5–15)
AST: 54 U/L — ABNORMAL HIGH (ref 15–41)
Albumin: 3.5 g/dL (ref 3.5–5.0)
BILIRUBIN TOTAL: 0.7 mg/dL (ref 0.3–1.2)
BUN: 36 mg/dL — ABNORMAL HIGH (ref 6–20)
CALCIUM: 9.4 mg/dL (ref 8.9–10.3)
CO2: 15 mmol/L — ABNORMAL LOW (ref 22–32)
CREATININE: 1.41 mg/dL — AB (ref 0.44–1.00)
Chloride: 109 mmol/L (ref 101–111)
GFR, EST AFRICAN AMERICAN: 49 mL/min — AB (ref >60)
GFR, EST NON AFRICAN AMERICAN: 42 mL/min — AB (ref >60)
Glucose, Bld: 152 mg/dL — ABNORMAL HIGH (ref 65–99)
Potassium: 4.3 mmol/L (ref 3.5–5.1)
SODIUM: 134 mmol/L — AB (ref 135–145)
TOTAL PROTEIN: 7.3 g/dL (ref 6.5–8.1)

## 2015-11-03 LAB — IRON AND TIBC
Iron: 104 ug/dL (ref 28–170)
SATURATION RATIOS: 38 % — AB (ref 10.4–31.8)
TIBC: 275 ug/dL (ref 250–450)
UIBC: 171 ug/dL

## 2015-11-03 NOTE — Progress Notes (Unsigned)
Platelet count 49000 today. Spoke with Dr Grayland Ormond. Hold Velcade today and recheck labs next week.

## 2015-11-04 LAB — PROTEIN ELECTROPHORESIS, SERUM
A/G RATIO SPE: 1 (ref 0.7–1.7)
Albumin ELP: 3.1 g/dL (ref 2.9–4.4)
Alpha-1-Globulin: 0.3 g/dL (ref 0.0–0.4)
Alpha-2-Globulin: 1.1 g/dL — ABNORMAL HIGH (ref 0.4–1.0)
Beta Globulin: 1.1 g/dL (ref 0.7–1.3)
GLOBULIN, TOTAL: 3.2 g/dL (ref 2.2–3.9)
Gamma Globulin: 0.7 g/dL (ref 0.4–1.8)
M-Spike, %: 0.3 g/dL — ABNORMAL HIGH
Total Protein ELP: 6.3 g/dL (ref 6.0–8.5)

## 2015-11-04 LAB — IGG, IGA, IGM
IgA: 72 mg/dL — ABNORMAL LOW (ref 87–352)
IgG (Immunoglobin G), Serum: 870 mg/dL (ref 700–1600)
IgM, Serum: 10 mg/dL — ABNORMAL LOW (ref 26–217)

## 2015-11-04 LAB — KAPPA/LAMBDA LIGHT CHAINS
KAPPA FREE LGHT CHN: 3497.5 mg/L — AB (ref 3.3–19.4)
KAPPA, LAMDA LIGHT CHAIN RATIO: 376.08 — AB (ref 0.26–1.65)
LAMDA FREE LIGHT CHAINS: 9.3 mg/L (ref 5.7–26.3)

## 2015-11-06 ENCOUNTER — Inpatient Hospital Stay: Payer: Self-pay

## 2015-11-06 ENCOUNTER — Other Ambulatory Visit: Payer: Self-pay

## 2015-11-06 ENCOUNTER — Other Ambulatory Visit: Payer: Self-pay | Admitting: *Deleted

## 2015-11-06 ENCOUNTER — Encounter (INDEPENDENT_AMBULATORY_CARE_PROVIDER_SITE_OTHER): Payer: Self-pay

## 2015-11-06 DIAGNOSIS — C9002 Multiple myeloma in relapse: Secondary | ICD-10-CM

## 2015-11-06 LAB — CBC WITH DIFFERENTIAL/PLATELET
BASOS ABS: 0 10*3/uL (ref 0–0.1)
EOS ABS: 0 10*3/uL (ref 0–0.7)
Eosinophils Relative: 1 %
HEMATOCRIT: 25.7 % — AB (ref 35.0–47.0)
HEMOGLOBIN: 9.1 g/dL — AB (ref 12.0–16.0)
Lymphocytes Relative: 14 %
Lymphs Abs: 0.8 10*3/uL — ABNORMAL LOW (ref 1.0–3.6)
MCH: 31.1 pg (ref 26.0–34.0)
MCHC: 35.3 g/dL (ref 32.0–36.0)
MCV: 88.1 fL (ref 80.0–100.0)
Monocytes Absolute: 1.1 10*3/uL — ABNORMAL HIGH (ref 0.2–0.9)
NEUTROS ABS: 4 10*3/uL (ref 1.4–6.5)
Platelets: 77 10*3/uL — ABNORMAL LOW (ref 150–440)
RBC: 2.92 MIL/uL — AB (ref 3.80–5.20)
RDW: 17 % — ABNORMAL HIGH (ref 11.5–14.5)
WBC: 5.9 10*3/uL (ref 3.6–11.0)

## 2015-11-06 LAB — COMPREHENSIVE METABOLIC PANEL
ALBUMIN: 3.5 g/dL (ref 3.5–5.0)
ALK PHOS: 87 U/L (ref 38–126)
ALT: 60 U/L — AB (ref 14–54)
AST: 64 U/L — AB (ref 15–41)
Anion gap: 8 (ref 5–15)
BILIRUBIN TOTAL: 0.4 mg/dL (ref 0.3–1.2)
BUN: 30 mg/dL — AB (ref 6–20)
CALCIUM: 9.8 mg/dL (ref 8.9–10.3)
CO2: 18 mmol/L — AB (ref 22–32)
Chloride: 112 mmol/L — ABNORMAL HIGH (ref 101–111)
Creatinine, Ser: 1.64 mg/dL — ABNORMAL HIGH (ref 0.44–1.00)
GFR calc Af Amer: 41 mL/min — ABNORMAL LOW (ref >60)
GFR calc non Af Amer: 35 mL/min — ABNORMAL LOW (ref >60)
GLUCOSE: 117 mg/dL — AB (ref 65–99)
Potassium: 4.8 mmol/L (ref 3.5–5.1)
SODIUM: 138 mmol/L (ref 135–145)
TOTAL PROTEIN: 7.5 g/dL (ref 6.5–8.1)

## 2015-11-06 MED ORDER — GABAPENTIN 300 MG PO CAPS: 300.0000 mg | ORAL_CAPSULE | Freq: Three times a day (TID) | ORAL | 2 refills | Status: DC

## 2015-11-13 ENCOUNTER — Other Ambulatory Visit: Payer: Self-pay

## 2015-11-13 ENCOUNTER — Inpatient Hospital Stay
Admission: EM | Admit: 2015-11-13 | Discharge: 2015-11-28 | DRG: 682 | Disposition: A | Discharge status: Home / Self Care | Payer: Medicaid Other | Attending: Internal Medicine | Admitting: Internal Medicine

## 2015-11-13 ENCOUNTER — Encounter: Payer: Self-pay | Admitting: Emergency Medicine

## 2015-11-13 ENCOUNTER — Emergency Department: Payer: Medicaid Other

## 2015-11-13 DIAGNOSIS — E1122 Type 2 diabetes mellitus with diabetic chronic kidney disease: Secondary | ICD-10-CM | POA: Diagnosis present

## 2015-11-13 DIAGNOSIS — C9002 Multiple myeloma in relapse: Secondary | ICD-10-CM

## 2015-11-13 DIAGNOSIS — E872 Acidosis, unspecified: Secondary | ICD-10-CM

## 2015-11-13 DIAGNOSIS — J9600 Acute respiratory failure, unspecified whether with hypoxia or hypercapnia: Secondary | ICD-10-CM | POA: Diagnosis present

## 2015-11-13 DIAGNOSIS — M1990 Unspecified osteoarthritis, unspecified site: Secondary | ICD-10-CM | POA: Diagnosis present

## 2015-11-13 DIAGNOSIS — E875 Hyperkalemia: Secondary | ICD-10-CM | POA: Diagnosis not present

## 2015-11-13 DIAGNOSIS — D696 Thrombocytopenia, unspecified: Secondary | ICD-10-CM | POA: Diagnosis not present

## 2015-11-13 DIAGNOSIS — E1165 Type 2 diabetes mellitus with hyperglycemia: Secondary | ICD-10-CM | POA: Diagnosis not present

## 2015-11-13 DIAGNOSIS — R740 Nonspecific elevation of levels of transaminase and lactic acid dehydrogenase [LDH]: Secondary | ICD-10-CM

## 2015-11-13 DIAGNOSIS — Z8249 Family history of ischemic heart disease and other diseases of the circulatory system: Secondary | ICD-10-CM | POA: Diagnosis not present

## 2015-11-13 DIAGNOSIS — I120 Hypertensive chronic kidney disease with stage 5 chronic kidney disease or end stage renal disease: Secondary | ICD-10-CM | POA: Diagnosis present

## 2015-11-13 DIAGNOSIS — R34 Anuria and oliguria: Secondary | ICD-10-CM | POA: Diagnosis present

## 2015-11-13 DIAGNOSIS — R0600 Dyspnea, unspecified: Secondary | ICD-10-CM

## 2015-11-13 DIAGNOSIS — C9000 Multiple myeloma not having achieved remission: Secondary | ICD-10-CM | POA: Diagnosis present

## 2015-11-13 DIAGNOSIS — K5900 Constipation, unspecified: Secondary | ICD-10-CM | POA: Diagnosis present

## 2015-11-13 DIAGNOSIS — A150 Tuberculosis of lung: Secondary | ICD-10-CM

## 2015-11-13 DIAGNOSIS — N170 Acute kidney failure with tubular necrosis: Principal | ICD-10-CM | POA: Diagnosis present

## 2015-11-13 DIAGNOSIS — R7401 Elevation of levels of liver transaminase levels: Secondary | ICD-10-CM

## 2015-11-13 DIAGNOSIS — Z992 Dependence on renal dialysis: Secondary | ICD-10-CM | POA: Diagnosis not present

## 2015-11-13 DIAGNOSIS — R0603 Acute respiratory distress: Secondary | ICD-10-CM

## 2015-11-13 DIAGNOSIS — K859 Acute pancreatitis without necrosis or infection, unspecified: Secondary | ICD-10-CM | POA: Diagnosis not present

## 2015-11-13 DIAGNOSIS — D631 Anemia in chronic kidney disease: Secondary | ICD-10-CM | POA: Diagnosis present

## 2015-11-13 DIAGNOSIS — D649 Anemia, unspecified: Secondary | ICD-10-CM

## 2015-11-13 DIAGNOSIS — N186 End stage renal disease: Secondary | ICD-10-CM | POA: Diagnosis present

## 2015-11-13 DIAGNOSIS — N179 Acute kidney failure, unspecified: Secondary | ICD-10-CM

## 2015-11-13 DIAGNOSIS — Z780 Asymptomatic menopausal state: Secondary | ICD-10-CM | POA: Diagnosis not present

## 2015-11-13 DIAGNOSIS — R000 Tachycardia, unspecified: Secondary | ICD-10-CM | POA: Diagnosis not present

## 2015-11-13 HISTORY — DX: Type 2 diabetes mellitus without complications: E11.9

## 2015-11-13 LAB — CBC WITH DIFFERENTIAL/PLATELET
BASOS ABS: 0 10*3/uL (ref 0–0.1)
Basophils Relative: 1 %
Eosinophils Absolute: 0 10*3/uL (ref 0–0.7)
Eosinophils Relative: 0 %
HCT: 26.8 % — ABNORMAL LOW (ref 35.0–47.0)
HEMOGLOBIN: 8.7 g/dL — AB (ref 12.0–16.0)
LYMPHS ABS: 1.6 10*3/uL (ref 1.0–3.6)
LYMPHS PCT: 18 %
MCH: 30.9 pg (ref 26.0–34.0)
MCHC: 32.7 g/dL (ref 32.0–36.0)
MCV: 94.6 fL (ref 80.0–100.0)
MONO ABS: 1.4 10*3/uL — AB (ref 0.2–0.9)
Monocytes Relative: 16 %
NEUTROS ABS: 5.7 10*3/uL (ref 1.4–6.5)
Neutrophils Relative %: 65 %
PLATELETS: 184 10*3/uL (ref 150–440)
RBC: 2.83 MIL/uL — AB (ref 3.80–5.20)
RDW: 18.8 % — ABNORMAL HIGH (ref 11.5–14.5)
WBC: 8.8 10*3/uL (ref 3.6–11.0)

## 2015-11-13 LAB — BLOOD GAS, VENOUS
ACID-BASE DEFICIT: 19.2 mmol/L — AB (ref 0.0–2.0)
Bicarbonate: 7.8 mmol/L — ABNORMAL LOW (ref 20.0–28.0)
O2 Saturation: 57.7 %
PCO2 VEN: 22 mmHg — AB (ref 44.0–60.0)
PH VEN: 7.16 — AB (ref 7.250–7.430)
Patient temperature: 37
pO2, Ven: 40 mmHg (ref 32.0–45.0)

## 2015-11-13 LAB — COMPREHENSIVE METABOLIC PANEL
ALBUMIN: 3.3 g/dL — AB (ref 3.5–5.0)
ALT: 272 U/L — ABNORMAL HIGH (ref 14–54)
ANION GAP: 15 (ref 5–15)
AST: 249 U/L — ABNORMAL HIGH (ref 15–41)
Alkaline Phosphatase: 142 U/L — ABNORMAL HIGH (ref 38–126)
BUN: 89 mg/dL — ABNORMAL HIGH (ref 6–20)
CHLORIDE: 109 mmol/L (ref 101–111)
CO2: 11 mmol/L — AB (ref 22–32)
Calcium: 9.2 mg/dL (ref 8.9–10.3)
Creatinine, Ser: 6.29 mg/dL — ABNORMAL HIGH (ref 0.44–1.00)
GFR calc non Af Amer: 7 mL/min — ABNORMAL LOW (ref >60)
GFR, EST AFRICAN AMERICAN: 8 mL/min — AB (ref >60)
GLUCOSE: 200 mg/dL — AB (ref 65–99)
POTASSIUM: 6.2 mmol/L — AB (ref 3.5–5.1)
SODIUM: 135 mmol/L (ref 135–145)
Total Bilirubin: 1.4 mg/dL — ABNORMAL HIGH (ref 0.3–1.2)
Total Protein: 7 g/dL (ref 6.5–8.1)

## 2015-11-13 LAB — TROPONIN I: Troponin I: <0.03 ng/mL (ref <0.03)

## 2015-11-13 MED ORDER — SODIUM CHLORIDE 0.9 % IV SOLN
Freq: Once | INTRAVENOUS | Status: AC
  Administered 2015-11-13: 1000 mL via INTRAVENOUS

## 2015-11-13 MED ORDER — SODIUM BICARBONATE 8.4 % IV SOLN
50.0000 meq | Freq: Once | INTRAVENOUS | Status: AC
  Administered 2015-11-13: 50 meq via INTRAVENOUS
  Filled 2015-11-13: qty 50

## 2015-11-13 MED ORDER — INSULIN ASPART 100 UNIT/ML ~~LOC~~ SOLN
10.0000 [IU] | Freq: Once | SUBCUTANEOUS | Status: AC
  Administered 2015-11-13: 10 [IU] via INTRAVENOUS
  Filled 2015-11-13: qty 10

## 2015-11-13 MED ORDER — LORAZEPAM 2 MG/ML IJ SOLN
0.5000 mg | Freq: Once | INTRAMUSCULAR | Status: AC
  Administered 2015-11-13: 0.5 mg via INTRAVENOUS

## 2015-11-13 MED ORDER — DEXTROSE 50 % IV SOLN
25.0000 g | Freq: Once | INTRAVENOUS | Status: AC
  Administered 2015-11-13: 25 g via INTRAVENOUS
  Filled 2015-11-13: qty 50

## 2015-11-13 MED ORDER — IOPAMIDOL (ISOVUE-300) INJECTION 61%: 30.0000 mL | Freq: Once | INTRAVENOUS | Status: DC

## 2015-11-13 MED ORDER — SODIUM CHLORIDE 0.9 % IV SOLN
10.0000 mL/h | Freq: Once | INTRAVENOUS | Status: AC
  Administered 2015-11-13: 10 mL/h via INTRAVENOUS

## 2015-11-13 MED ORDER — SODIUM CHLORIDE 0.9 % IV SOLN
1.0000 g | Freq: Once | INTRAVENOUS | Status: AC
  Administered 2015-11-13: 1 g via INTRAVENOUS
  Filled 2015-11-13: qty 10

## 2015-11-13 MED ORDER — ONDANSETRON HCL 4 MG/2ML IJ SOLN
4.0000 mg | Freq: Once | INTRAMUSCULAR | Status: AC
  Administered 2015-11-13: 4 mg via INTRAVENOUS

## 2015-11-13 MED ORDER — ONDANSETRON HCL 4 MG/2ML IJ SOLN
INTRAMUSCULAR | Status: AC
  Administered 2015-11-13: 4 mg via INTRAVENOUS
  Filled 2015-11-13: qty 2

## 2015-11-13 MED ORDER — LORAZEPAM 2 MG/ML IJ SOLN
INTRAMUSCULAR | Status: AC
  Administered 2015-11-13: 0.5 mg via INTRAVENOUS
  Filled 2015-11-13: qty 1

## 2015-11-13 NOTE — ED Notes (Signed)
Blood consent form signed with medical interpreter at bedside.

## 2015-11-13 NOTE — ED Provider Notes (Signed)
[facility] Emergency Department Provider Note        Time seen: ----------------------------------------- 9:45 PM on 11/13/2015 -----------------------------------------    I have reviewed the triage vital signs and the nursing notes.   HISTORY  Chief Complaint Abdominal Pain and Respiratory Distress    HPI Jamie Keller is a 50 y.o. female who is being treated for multiple myeloma but has not had recent chemotherapy treatment due to low blood counts. Patient reports abdominal distention since Monday, poor bowel movements since then. [name] also been very weak and near syncopal as well as short of breath. She has required blood transfusion about a month ago. She denies fever but has had chills, denies chest pain but has trouble breathing. She has had one episode of vomiting.   Past Medical History:  Diagnosis Date  . Diabetes mellitus without complication ([place])   . Hypertension   . Multiple myeloma ([place])   . Osteoarthritis   . Post-menopausal 2014    Patient Active Problem List   Diagnosis Date Noted  . Multiple myeloma in relapse ([place]) 09/06/2015  . Post-menopausal   . Bulging lumbar disc 05/28/2014  . Low back pain 05/28/2014    Past Surgical History:  Procedure Laterality Date  . CESAREAN SECTION     x2    Allergies No known allergies  Social History Social History  Substance Use Topics  . Smoking status: Never Smoker  . Smokeless tobacco: Never Used  . Alcohol use No    Review of Systems Constitutional: Negative for fever.Positive for chills Cardiovascular: Negative for chest pain. Respiratory: Positive for dyspnea Gastrointestinal: Positive for abdominal distention, vomiting Genitourinary: Negative for dysuria. Musculoskeletal: Negative for back pain. Skin: Positive for pallor Neurological: Positive for weakness  10-point ROS otherwise negative.  ____________________________________________   PHYSICAL  EXAM:  VITAL SIGNS: ED Triage Vitals  Enc Vitals Group     BP 11/13/15 2137 122/61     Pulse Rate 11/13/15 2137 75     Resp 11/13/15 2137 18     Temp 11/13/15 2137 97.7 F (36.5 C)     Temp Source 11/13/15 2137 Oral     SpO2 11/13/15 2137 99 %     Weight 11/13/15 2135 120 lb (54.4 kg)     Height --      Head Circumference --      Peak Flow --      Pain Score 11/13/15 2136 6     Pain Loc --      Pain Edu? --      Excl. in [place]? --    Constitutional: Alert and oriented. Moderate distress Eyes: Conjunctivae are pale. PERRL. Normal extraocular movements. ENT   Head: Normocephalic and atraumatic.   Nose: No congestion/rhinnorhea.   Mouth/Throat: Mucous membranes are moist.   Neck: No stridor. Cardiovascular: Rapid rate, regular rhythm. No murmurs, rubs, or gallops. Respiratory: Tachypnea with clear breath sounds Gastrointestinal: Soft and nontender. Normal bowel sounds Musculoskeletal: Nontender with normal range of motion in all extremities. No lower extremity tenderness nor edema. Neurologic:  Normal speech and language. No gross focal neurologic deficits are appreciated.  Skin:  Skin is warm, dry and intact. Pallor is noted Psychiatric: Anxious mood ____________________________________________  EKG: Interpreted by me. Sinus rhythm rate of 60 bpm, normal PR interval, wide QRS, normal QT interval, right bundle branch block, nonspecific ST-T wave changes.  ____________________________________________  ED COURSE:  Pertinent labs & imaging results that were available during my care of the patient were  reviewed by me and considered in my medical decision making (see chart for details). Clinical Course  Patient resents to ER with dyspnea which is likely anemia related. Patient appears very pale, we will give her emergency release blood as she is very dyspneic. CRITICAL CARE Performed by: Earleen Newport   Total critical care time: 60 minutes  Critical care  time was exclusive of separately billable procedures and treating other patients.  Critical care was necessary to treat or prevent imminent or life-threatening deterioration.  Critical care was time spent personally by me on the following activities: development of treatment plan with patient and/or surrogate as well as nursing, discussions with consultants, evaluation of patient's response to treatment, examination of patient, obtaining history from patient or surrogate, ordering and performing treatments and interventions, ordering and review of laboratory studies, ordering and review of radiographic studies, pulse oximetry and re-evaluation of patient's condition.   Procedures ____________________________________________   LABS (pertinent positives/negatives)  Labs Reviewed  CBC WITH DIFFERENTIAL/PLATELET - Abnormal; Notable for the following:       Result Value   RBC 2.83 (*)    Hemoglobin 8.7 (*)    HCT 26.8 (*)    RDW 18.8 (*)    Monocytes Absolute 1.4 (*)    All other components within normal limits  COMPREHENSIVE METABOLIC PANEL - Abnormal; Notable for the following:    Potassium 6.2 (*)    CO2 11 (*)    Glucose, Bld 200 (*)    BUN 89 (*)    Creatinine, Ser 6.29 (*)    Albumin 3.3 (*)    AST 249 (*)    ALT 272 (*)    Alkaline Phosphatase 142 (*)    Total Bilirubin 1.4 (*)    GFR calc non Af Amer 7 (*)    GFR calc Af Amer 8 (*)    All other components within normal limits  BLOOD GAS, VENOUS - Abnormal; Notable for the following:    pH, Ven 7.16 (*)    pCO2, Ven 22 (*)    Bicarbonate 7.8 (*)    Acid-base deficit 19.2 (*)    All other components within normal limits  TROPONIN I  URINALYSIS COMPLETEWITH MICROSCOPIC ([facility] ONLY)  BASIC METABOLIC PANEL  TYPE AND SCREEN  PREPARE RBC (CROSSMATCH)    RADIOLOGY Images were viewed by me  Chest x-ray, CT of the abdomen and pelvis IMPRESSION: 1. Obscuration of both hemidiaphragms is increased from 09/22/2015 and may  reflect atelectasis or pneumonia. The underlying pleural effusions are not readily excluded either. 2. Mild enlargement of the cardiopericardial silhouette.  ____________________________________________  FINAL ASSESSMENT AND PLAN  Dyspnea, anemia, abdominal pain, acute renal failure, metabolic acidosis with respiratory compensation, elevated liver transaminases  Plan: Patient with labs and imaging as dictated above. Patient presents in acute distress from unclear etiology. Found to be in acute renal failure and acidemic from same. I did initiate blood transfusion because she appeared very anemic however this did not turn out to be the primary problem. She appears to have acute renal failure which has caused significant acidemia and an attempted respiratory compensation. She is also received IV fluid, calcium, insulin, D50 and we will order Kayexalate. I have discussed the case with oncology on call as well as nephrology on call. Etiology for this is uncertain at this time. She remains in critical condition, I will discuss with the eICU doctor on call.   Earleen Newport, MD   Note: This dictation was prepared with Viviann Spare  dictation. Any transcriptional errors that result from this process are unintentional    Earleen Newport, MD 11/13/15 2336

## 2015-11-13 NOTE — ED Notes (Signed)
Pt become very faint, reports difficulty breathing, O2 sat down to 77% on 2L nasal cannula.  Pt placed on non-rebreather, O2 sat up to 100%.  MD notified.

## 2015-11-13 NOTE — ED Notes (Signed)
Pt presents to STAT desk via w/c, alert but labored respirations, pale in color, accomp by family who reports pt with multiple myeloma, has not been able to have chemo past few tx due to "blood counts being low"; developed abd pain and SHOB today; pt taken immed to room 6 accomp by [facility] interpreter; charge nurse, care nurse and Dr Jimmye Norman called to room for further evaluation and assessment

## 2015-11-14 ENCOUNTER — Inpatient Hospital Stay: Payer: Medicaid Other

## 2015-11-14 DIAGNOSIS — R000 Tachycardia, unspecified: Secondary | ICD-10-CM | POA: Diagnosis not present

## 2015-11-14 DIAGNOSIS — N170 Acute kidney failure with tubular necrosis: Secondary | ICD-10-CM | POA: Diagnosis present

## 2015-11-14 DIAGNOSIS — E1165 Type 2 diabetes mellitus with hyperglycemia: Secondary | ICD-10-CM | POA: Diagnosis present

## 2015-11-14 DIAGNOSIS — D631 Anemia in chronic kidney disease: Secondary | ICD-10-CM | POA: Diagnosis present

## 2015-11-14 DIAGNOSIS — E872 Acidosis: Secondary | ICD-10-CM | POA: Diagnosis present

## 2015-11-14 DIAGNOSIS — Z780 Asymptomatic menopausal state: Secondary | ICD-10-CM | POA: Diagnosis not present

## 2015-11-14 DIAGNOSIS — E1122 Type 2 diabetes mellitus with diabetic chronic kidney disease: Secondary | ICD-10-CM | POA: Diagnosis present

## 2015-11-14 DIAGNOSIS — D696 Thrombocytopenia, unspecified: Secondary | ICD-10-CM | POA: Diagnosis present

## 2015-11-14 DIAGNOSIS — J9601 Acute respiratory failure with hypoxia: Secondary | ICD-10-CM

## 2015-11-14 DIAGNOSIS — M1990 Unspecified osteoarthritis, unspecified site: Secondary | ICD-10-CM | POA: Diagnosis present

## 2015-11-14 DIAGNOSIS — K859 Acute pancreatitis without necrosis or infection, unspecified: Secondary | ICD-10-CM | POA: Diagnosis present

## 2015-11-14 DIAGNOSIS — K5900 Constipation, unspecified: Secondary | ICD-10-CM | POA: Diagnosis present

## 2015-11-14 DIAGNOSIS — E875 Hyperkalemia: Secondary | ICD-10-CM | POA: Diagnosis present

## 2015-11-14 DIAGNOSIS — T50901A Poisoning by unspecified drugs, medicaments and biological substances, accidental (unintentional), initial encounter: Secondary | ICD-10-CM | POA: Insufficient documentation

## 2015-11-14 DIAGNOSIS — N186 End stage renal disease: Secondary | ICD-10-CM | POA: Diagnosis present

## 2015-11-14 DIAGNOSIS — Z8249 Family history of ischemic heart disease and other diseases of the circulatory system: Secondary | ICD-10-CM | POA: Diagnosis not present

## 2015-11-14 DIAGNOSIS — J9600 Acute respiratory failure, unspecified whether with hypoxia or hypercapnia: Secondary | ICD-10-CM | POA: Diagnosis present

## 2015-11-14 DIAGNOSIS — N179 Acute kidney failure, unspecified: Secondary | ICD-10-CM

## 2015-11-14 DIAGNOSIS — C9000 Multiple myeloma not having achieved remission: Secondary | ICD-10-CM | POA: Diagnosis present

## 2015-11-14 DIAGNOSIS — R34 Anuria and oliguria: Secondary | ICD-10-CM | POA: Diagnosis present

## 2015-11-14 DIAGNOSIS — Z992 Dependence on renal dialysis: Secondary | ICD-10-CM | POA: Diagnosis not present

## 2015-11-14 DIAGNOSIS — I120 Hypertensive chronic kidney disease with stage 5 chronic kidney disease or end stage renal disease: Secondary | ICD-10-CM | POA: Diagnosis present

## 2015-11-14 LAB — BASIC METABOLIC PANEL
ANION GAP: 15 (ref 5–15)
Anion gap: 10 (ref 5–15)
BUN: 85 mg/dL — ABNORMAL HIGH (ref 6–20)
BUN: 82 mg/dL — AB (ref 6–20)
CALCIUM: 9.1 mg/dL (ref 8.9–10.3)
CHLORIDE: 110 mmol/L (ref 101–111)
CHLORIDE: 113 mmol/L — AB (ref 101–111)
CO2: 15 mmol/L — AB (ref 22–32)
CO2: 9 mmol/L — ABNORMAL LOW (ref 22–32)
CREATININE: 5.68 mg/dL — AB (ref 0.44–1.00)
Calcium: 9.9 mg/dL (ref 8.9–10.3)
Creatinine, Ser: 5.67 mg/dL — ABNORMAL HIGH (ref 0.44–1.00)
GFR calc Af Amer: 9 mL/min — ABNORMAL LOW (ref >60)
GFR calc non Af Amer: 8 mL/min — ABNORMAL LOW (ref >60)
GFR, EST AFRICAN AMERICAN: 9 mL/min — AB (ref >60)
GFR, EST NON AFRICAN AMERICAN: 8 mL/min — AB (ref >60)
GLUCOSE: 201 mg/dL — AB (ref 65–99)
Glucose, Bld: 240 mg/dL — ABNORMAL HIGH (ref 65–99)
POTASSIUM: 5.6 mmol/L — AB (ref 3.5–5.1)
Potassium: 5.9 mmol/L — ABNORMAL HIGH (ref 3.5–5.1)
SODIUM: 137 mmol/L (ref 135–145)
Sodium: 135 mmol/L (ref 135–145)

## 2015-11-14 LAB — BLOOD GAS, ARTERIAL
Acid-base deficit: 8.1 mmol/L — ABNORMAL HIGH (ref 0.0–2.0)
Bicarbonate: 16.4 mmol/L — ABNORMAL LOW (ref 20.0–28.0)
EXPIRATORY PAP: 5
INSPIRATORY PAP: 10
O2 Saturation: 88.5 %
PATIENT TEMPERATURE: 37
PH ART: 7.36 (ref 7.350–7.450)
PO2 ART: 58 mmHg — AB (ref 83.0–108.0)
pCO2 arterial: 29 mmHg — ABNORMAL LOW (ref 32.0–48.0)

## 2015-11-14 LAB — CBC
HEMATOCRIT: 26.3 % — AB (ref 35.0–47.0)
HEMOGLOBIN: 9.2 g/dL — AB (ref 12.0–16.0)
MCH: 31.2 pg (ref 26.0–34.0)
MCHC: 34.9 g/dL (ref 32.0–36.0)
MCV: 89.2 fL (ref 80.0–100.0)
Platelets: 123 10*3/uL — ABNORMAL LOW (ref 150–440)
RBC: 2.94 MIL/uL — ABNORMAL LOW (ref 3.80–5.20)
RDW: 18.3 % — AB (ref 11.5–14.5)
WBC: 7.2 10*3/uL (ref 3.6–11.0)

## 2015-11-14 LAB — LIPASE, BLOOD: LIPASE: 44 U/L (ref 11–51)

## 2015-11-14 LAB — MAGNESIUM: Magnesium: 2 mg/dL (ref 1.7–2.4)

## 2015-11-14 LAB — GLUCOSE, CAPILLARY
GLUCOSE-CAPILLARY: 178 mg/dL — AB (ref 65–99)
Glucose-Capillary: 191 mg/dL — ABNORMAL HIGH (ref 65–99)
Glucose-Capillary: 91 mg/dL (ref 65–99)
Glucose-Capillary: 133 mg/dL — ABNORMAL HIGH (ref 65–99)

## 2015-11-14 LAB — LACTIC ACID, PLASMA
LACTIC ACID, VENOUS: 7.6 mmol/L — AB (ref 0.5–1.9)
Lactic Acid, Venous: 2.1 mmol/L (ref 0.5–1.9)

## 2015-11-14 LAB — PHOSPHORUS
PHOSPHORUS: 6.7 mg/dL — AB (ref 2.5–4.6)
Phosphorus: 4.9 mg/dL — ABNORMAL HIGH (ref 2.5–4.6)

## 2015-11-14 LAB — PREPARE RBC (CROSSMATCH)

## 2015-11-14 LAB — MRSA PCR SCREENING: MRSA BY PCR: NEGATIVE

## 2015-11-14 MED ORDER — LIDOCAINE HCL (PF) 1 % IJ SOLN
5.0000 mL | INTRAMUSCULAR | Status: DC | PRN
  Filled 2015-11-14: qty 5

## 2015-11-14 MED ORDER — SODIUM CHLORIDE 0.9 % IV SOLN: 250.0000 mL | Freq: Once | INTRAVENOUS | Status: DC

## 2015-11-14 MED ORDER — VERAPAMIL HCL 80 MG PO TABS
80.0000 mg | ORAL_TABLET | Freq: Every day | ORAL | Status: DC
  Administered 2015-11-14 – 2015-11-28 (×12): 80 mg via ORAL
  Filled 2015-11-14 (×7): qty 1
  Filled 2015-11-14: qty 2
  Filled 2015-11-14 (×6): qty 1

## 2015-11-14 MED ORDER — LIDOCAINE-PRILOCAINE 2.5-2.5 % EX CREA
1.0000 "application " | TOPICAL_CREAM | CUTANEOUS | Status: DC | PRN
  Filled 2015-11-14: qty 5

## 2015-11-14 MED ORDER — FAMOTIDINE IN NACL 20-0.9 MG/50ML-% IV SOLN
20.0000 mg | INTRAVENOUS | Status: DC
  Administered 2015-11-14 – 2015-11-16 (×3): 20 mg via INTRAVENOUS
  Filled 2015-11-14 (×3): qty 50

## 2015-11-14 MED ORDER — PIPERACILLIN-TAZOBACTAM 3.375 G IVPB
3.3750 g | Freq: Two times a day (BID) | INTRAVENOUS | Status: DC
  Administered 2015-11-15 – 2015-11-18 (×8): 3.375 g via INTRAVENOUS
  Filled 2015-11-14 (×8): qty 50

## 2015-11-14 MED ORDER — PIPERACILLIN-TAZOBACTAM 3.375 G IVPB
3.3750 g | Freq: Three times a day (TID) | INTRAVENOUS | Status: DC
  Administered 2015-11-14: 3.375 g via INTRAVENOUS
  Filled 2015-11-14: qty 50

## 2015-11-14 MED ORDER — ENOXAPARIN SODIUM 40 MG/0.4ML ~~LOC~~ SOLN
30.0000 mg | Freq: Two times a day (BID) | SUBCUTANEOUS | Status: DC
  Filled 2015-11-14: qty 0.4

## 2015-11-14 MED ORDER — OXYCODONE HCL 5 MG PO TABS
10.0000 mg | ORAL_TABLET | ORAL | Status: DC | PRN
  Administered 2015-11-14 – 2015-11-18 (×3): 10 mg via ORAL
  Filled 2015-11-14 (×3): qty 2

## 2015-11-14 MED ORDER — ENOXAPARIN SODIUM 40 MG/0.4ML ~~LOC~~ SOLN: 30.0000 mg | Freq: Every day | SUBCUTANEOUS | Status: DC

## 2015-11-14 MED ORDER — SODIUM BICARBONATE 8.4 % IV SOLN
INTRAVENOUS | Status: DC
  Administered 2015-11-14 – 2015-11-16 (×4): via INTRAVENOUS
  Filled 2015-11-14 (×6): qty 850

## 2015-11-14 MED ORDER — SODIUM CHLORIDE 0.9% FLUSH: 3.0000 mL | INTRAVENOUS | Status: DC | PRN

## 2015-11-14 MED ORDER — VANCOMYCIN HCL IN DEXTROSE 1-5 GM/200ML-% IV SOLN
1000.0000 mg | Freq: Once | INTRAVENOUS | Status: AC
  Administered 2015-11-14: 1000 mg via INTRAVENOUS
  Filled 2015-11-14: qty 200

## 2015-11-14 MED ORDER — HEPARIN SOD (PORK) LOCK FLUSH 100 UNIT/ML IV SOLN
500.0000 [IU] | Freq: Every day | INTRAVENOUS | Status: DC | PRN
  Filled 2015-11-14: qty 5

## 2015-11-14 MED ORDER — SODIUM CHLORIDE 0.9 % IV SOLN: 100.0000 mL | INTRAVENOUS | Status: DC | PRN

## 2015-11-14 MED ORDER — SODIUM CHLORIDE 0.9% FLUSH: 10.0000 mL | INTRAVENOUS | Status: DC | PRN

## 2015-11-14 MED ORDER — HEPARIN SODIUM (PORCINE) 5000 UNIT/ML IJ SOLN
5000.0000 [IU] | Freq: Two times a day (BID) | INTRAMUSCULAR | Status: DC
  Administered 2015-11-14 – 2015-11-28 (×26): 5000 [IU] via SUBCUTANEOUS
  Filled 2015-11-14 (×27): qty 1

## 2015-11-14 MED ORDER — HEPARIN SODIUM (PORCINE) 5000 UNIT/ML IJ SOLN: 5000.0000 [IU] | Freq: Three times a day (TID) | INTRAMUSCULAR | Status: DC

## 2015-11-14 MED ORDER — SODIUM CHLORIDE 0.9 % IV SOLN: 250.0000 mL | INTRAVENOUS | Status: DC | PRN

## 2015-11-14 MED ORDER — INSULIN REGULAR HUMAN 100 UNIT/ML IJ SOLN
10.0000 [IU] | Freq: Once | INTRAMUSCULAR | Status: AC
  Administered 2015-11-14: 10 [IU] via INTRAVENOUS
  Filled 2015-11-14: qty 0.1

## 2015-11-14 MED ORDER — ALTEPLASE 2 MG IJ SOLR: 2.0000 mg | Freq: Once | INTRAMUSCULAR | Status: DC | PRN

## 2015-11-14 MED ORDER — DEXTROSE 50 % IV SOLN
25.0000 mL | Freq: Once | INTRAVENOUS | Status: AC
  Administered 2015-11-14: 25 mL via INTRAVENOUS
  Filled 2015-11-14: qty 50

## 2015-11-14 MED ORDER — HEPARIN SODIUM (PORCINE) 1000 UNIT/ML DIALYSIS
1000.0000 [IU] | INTRAMUSCULAR | Status: DC | PRN
  Filled 2015-11-14: qty 1

## 2015-11-14 MED ORDER — MORPHINE SULFATE (PF) 2 MG/ML IV SOLN
2.0000 mg | Freq: Once | INTRAVENOUS | Status: AC
  Administered 2015-11-14: 2 mg via INTRAVENOUS
  Filled 2015-11-14: qty 1

## 2015-11-14 MED ORDER — HEPARIN SOD (PORK) LOCK FLUSH 100 UNIT/ML IV SOLN
250.0000 [IU] | INTRAVENOUS | Status: DC | PRN
  Filled 2015-11-14: qty 3

## 2015-11-14 MED ORDER — PENTAFLUOROPROP-TETRAFLUOROETH EX AERO
1.0000 "application " | INHALATION_SPRAY | CUTANEOUS | Status: DC | PRN
  Filled 2015-11-14: qty 30

## 2015-11-14 NOTE — Progress Notes (Signed)
Pt being transferred to room 204. Report called to [place]. Pt and belongings transferred to room without incident.

## 2015-11-14 NOTE — Progress Notes (Signed)
HD COMPLETED  

## 2015-11-14 NOTE — Progress Notes (Signed)
HD STARTED  

## 2015-11-14 NOTE — Procedures (Signed)
Central Venous Dailysis Catheter Placement: Indication: Hemo Dialysis/CRRT   Consent:in chart.   Risks and benefits explained in detail including risk of infection, bleeding, respiratory failure and death..   Hand washing performed prior to starting the procedure.   Procedure: An active timeout was performed and correct patient, name, & ID confirmed.  After explaining risk and benefits, patient was positioned correctly for central venous access. Patient was prepped using strict sterile technique including chlorohexadine preps, sterile drape, sterile gown and sterile gloves.  The area was prepped, draped and anesthetized in the usual sterile manner. Patient comfort was obtained by pre-administration of 2 mg of morphine IV.  A triple lumen dialysis catheter was placed in right femoral Vein There was good blood return, catheter caps were placed on lumens, catheter flushed easily, the line was secured and a sterile dressing and BIO-PATCH applied.   Ultrasound was used to visualize vasculature and guidance of needle.   Number of Attempts: 1 Complications:none  Estimated Blood Loss: 20 mL    Deep Ashby Dawes, M.D. 11/14/2015

## 2015-11-14 NOTE — Progress Notes (Signed)
Sign out by Dr. Isidore Moos. She was admitted with renal failure , acidosis, requiring bipap.  Started on HD, Bicarb drip, Improved, now on room air. Have some Pancreatic swelling and GI consult placed for that.  We will resume care tomorrow morning.

## 2015-11-14 NOTE — Progress Notes (Signed)
Pharmacy Antibiotic Note  Jamie Keller is a 50 y.o. female admitted on 11/13/2015 with pneumonia.  Pharmacy has been consulted for vancomycin dosing.  Plan: DW 51.4kg  Vd 38L kei 0.013 hr-1 t1/2 53 hours Vancomycin 1 gram x1 ordered. Level ordered for tomorrow AM.  Would recommend dosing based on serial levels until renal function improves/stabilizes.  Weight: 120 lb (54.4 kg)  Temp (24hrs), Avg:97.4 F (36.3 C), Min:96.3 F (35.7 C), Max:98 F (36.7 C)   Recent Labs Lab 11/13/15 2138 11/14/15 0000  WBC 8.8  --   CREATININE 6.29* 5.67*  LATICACIDVEN  --  7.6*    Estimated Creatinine Clearance: 9.7 mL/min (by C-G formula based on SCr of 5.67 mg/dL).    Allergies  Allergen Reactions  . No Known Allergies     Antimicrobials this admission: vancomycin  >>  Zosyn  >>   Dose adjustments this admission:   Microbiology results: No micro ordered   9/7 CXR atelectasis vs. PNA 9/7 UA; pending  Thank you for allowing pharmacy to be a part of this patient's care.  Gita Dilger S 11/14/2015 3:03 AM

## 2015-11-14 NOTE — Progress Notes (Signed)
[facility]  ROUNDING NOTE   Subjective:  Patient known to [place] from the office. We last saw her in 2015. Patient has known underlying multiple myeloma and is being treated for the same. On 11/03/2015 creatinine was 1.4. Creatinine has now risen.  Yesterday creatinine was 6.29. With IV fluid hydration creatinine is come down to 5.6. However urine output is low and potassium remains high at 5.9. Patient states that she was having abdominal pain which started on Monday. It has been persistent in nature. There was a suggestion of possible pancreatitis on the abdominal CT however lipase was normal at 44.   Objective:  Vital signs in last 24 hours:  Temp:  [96.3 F (35.7 C)-98.8 F (37.1 C)] 98.8 F (37.1 C) (09/08 0500) Pulse Rate:  [61-93] 86 (09/08 0500) Resp:  [14-33] 15 (09/08 0500) BP: (106-179)/(54-86) 109/65 (09/08 0500) SpO2:  [96 %-100 %] 100 % (09/08 0500) Weight:  [54.4 kg (120 lb)] 54.4 kg (120 lb) (09/07 2135)  Weight change:  Filed Weights   11/13/15 2135  Weight: 54.4 kg (120 lb)    Intake/Output: I/O last 3 completed shifts: In: 1056.3 [I.V.:383.3; Blood:673] Out: 150 [Urine:150]   Intake/Output this shift:  No intake/output data recorded.  Physical Exam: General: No acute distress, resting in bed  Head: Normocephalic, atraumatic. Moist oral mucosal membranes  Eyes: Anicteric  Neck: Supple, trachea midline  Lungs:  Clear to auscultation, normal effort  Heart: S1S2 no rubs  Abdomen:  Soft, mid upper abdominal tenderness noted  Extremities: no peripheral edema.  Neurologic: Nonfocal, moving all four extremities  Skin: No lesions  Access:     Basic Metabolic Panel:  Recent Labs Lab 11/13/15 2138 11/14/15 0000 11/14/15 0326  NA 135 137 135  K 6.2* 5.6* 5.9*  CL 109 113* 110  CO2 11* 9* 15*  GLUCOSE 200* 240* 201*  BUN 89* 82* 85*  CREATININE 6.29* 5.67* 5.68*  CALCIUM 9.2 9.9 9.1  MG  --   --  2.0  PHOS  --   --  6.7*     Liver Function Tests:  Recent Labs Lab 11/13/15 2138  AST 249*  ALT 272*  ALKPHOS 142*  BILITOT 1.4*  PROT 7.0  ALBUMIN 3.3*    Recent Labs Lab 11/14/15 0326  LIPASE 44   No results for input(s): AMMONIA in the last 168 hours.  CBC:  Recent Labs Lab 11/13/15 2138 11/14/15 0326  WBC 8.8 7.2  NEUTROABS 5.7  --   HGB 8.7* 9.2*  HCT 26.8* 26.3*  MCV 94.6 89.2  PLT 184 123*    Cardiac Enzymes:  Recent Labs Lab 11/13/15 2138  TROPONINI <0.03    BNP: Invalid input(s): POCBNP  CBG:  Recent Labs Lab 11/14/15 0154 11/14/15 0745  GLUCAP 191* 69*    Microbiology: Results for orders placed or performed during the hospital encounter of 11/13/15  MRSA PCR Screening     Status: None   Collection Time: 11/14/15  3:11 AM  Result Value Ref Range Status   MRSA by PCR NEGATIVE NEGATIVE Final    Comment:        The GeneXpert MRSA Assay (FDA approved for NASAL specimens only), is one component of a comprehensive MRSA colonization surveillance program. It is not intended to diagnose MRSA infection nor to guide or monitor treatment for MRSA infections.     Coagulation Studies: No results for input(s): LABPROT, INR in the last 72 hours.  Urinalysis: No results for input(s): COLORURINE, LABSPEC,  PHURINE, GLUCOSEU, HGBUR, BILIRUBINUR, KETONESUR, PROTEINUR, UROBILINOGEN, NITRITE, LEUKOCYTESUR in the last 72 hours.  Invalid input(s): APPERANCEUR    Imaging: Ct Abdomen Pelvis Wo Contrast  Result Date: 11/14/2015 CLINICAL DATA:  Acute onset of generalized abdominal distention. Generalized weakness and near syncope. Shortness of breath. Current history of multiple myeloma. Chills and vomiting. Initial encounter. EXAM: CT ABDOMEN AND PELVIS WITHOUT CONTRAST TECHNIQUE: Multidetector CT imaging of the abdomen and pelvis was performed following the standard protocol without IV contrast. COMPARISON:  CT of the abdomen and pelvis performed 09/02/2015 FINDINGS:  Lower chest: Small bilateral pleural effusions are noted, right greater than left, with bibasilar airspace opacities, possibly reflecting atelectasis or pneumonia. Hepatobiliary: The liver is unremarkable in appearance. Trace ascites is seen tracking about the liver and spleen. The gallbladder is grossly unremarkable in appearance. The common bile duct remains normal in caliber. Pancreas: Vague soft tissue inflammation is suggested about the pancreas, with fluid tracking inferiorly along the duodenum and Gerota's fascia on the right, concerning for acute pancreatitis. Evaluation for devascularization is limited without contrast. No pseudocysts are seen. Spleen: The spleen is unremarkable in appearance. Adrenals/Urinary Tract: The adrenal glands are grossly unremarkable. Nonspecific perinephric stranding is noted bilaterally. There is no evidence of hydronephrosis. No renal or ureteral stones are identified. Stomach/Bowel: The stomach is grossly unremarkable in appearance. No significant small bowel abnormalities are seen. The appendix is normal in caliber, without evidence of appendicitis. The colon is decompressed and grossly unremarkable in appearance. Vascular/Lymphatic: The vasculature is not well assessed without contrast. Minimal vascular calcification is seen. No retroperitoneal lymphadenopathy is seen. No pelvic sidewall lymphadenopathy is identified. Reproductive: The bladder is mildly distended and grossly unremarkable. The uterus is grossly unremarkable in appearance. The ovaries are relatively symmetric. No suspicious adnexal masses are seen. Other: A small amount of fluid within the pelvis. Presacral stranding is noted. Musculoskeletal: There is diffuse sclerosis and numerous lytic lesions throughout the visualized osseous structures, with chronic loss of height noted at T11. The visualized musculature is grossly unremarkable in appearance. IMPRESSION: 1. Vague soft tissue inflammation about the  pancreas, with fluid tracking inferiorly along the duodenum and Gerota's fascia on the right, concerning for acute pancreatitis. Would correlate with pancreatic lab values. No pseudocyst seen. Evaluation for devascularization is limited without contrast. 2. Trace ascites noted within the abdomen and pelvis. 3. Small bilateral pleural effusions, right greater than left, with bibasilar airspace opacities possibly reflecting atelectasis or pneumonia. 4. Diffuse sclerosis and numerous lytic lesions throughout the visualized osseous structures, with chronic loss of height at T11. This reflects the patient's known multiple myeloma. Electronically Signed   By: Garald Balding M.D.   On: 11/14/2015 02:32   Dg Chest 1 View  Result Date: 11/13/2015 CLINICAL DATA:  Pallor. Abdominal pain and shortness of breath. Multiple myeloma. EXAM: CHEST 1 VIEW COMPARISON:  09/22/2015 FINDINGS: Power injectable right IJ Port-A-Cath tip: SVC. Mild enlargement of the cardiopericardial silhouette with bibasilar airspace opacities obscuring the hemidiaphragms. Old bilateral lateral lower rib fractures. Thoracic spondylosis. IMPRESSION: 1. Obscuration of both hemidiaphragms is increased from 09/22/2015 and may reflect atelectasis or pneumonia. The underlying pleural effusions are not readily excluded either. 2. Mild enlargement of the cardiopericardial silhouette. Electronically Signed   By: Van Clines M.D.   On: 11/13/2015 23:07     Medications:   .  sodium bicarbonate 150 mEq in sterile water 1000 mL infusion 125 mL/hr at 11/14/15 0231   . famotidine (PEPCID) IV  20 mg Intravenous Q24H  .  heparin subcutaneous  5,000 Units Subcutaneous Q12H  . iopamidol  30 mL Oral Once  . piperacillin-tazobactam (ZOSYN)  IV  3.375 g Intravenous Q12H  . verapamil  80 mg Oral Daily   sodium chloride, oxyCODONE  Assessment/ Plan:  50 y.o. female with past medical history of diabetes mellitus type 2, hypertension, multiple myeloma on  chemotherapy, osteoarthritis, chronic kidney disease stage III who presents now with abdominal pain and acute renal failure.  1. Acute renal failure. 2. Hyperkalemia. 3. Abdominal pain with inflammation around the pancreas noted on CT scan of the abdomen and pelvis with normal lipase. 4. Metabolic acidosis with serum bicarbonate of 15. 5. Anemia likely related to chronic kidney disease and multiple myeloma.  Plan: The patient has had sudden decrease in renal function. Creatinine was 6.29 upon admission but now down to 5.68. However patient has ongoing acidosis as well as hyperkalemia. Therefore we recommend a trial of hemodialysis at this time. I have discussed the case with pulmonary critical care who will place a dialysis catheter. We will proceed with a short dialysis treatment today of 1.5 hours using a 2 potassium bath. We will reassess the patient's renal function tomorrow as well. We'll also recommend gastroenterology consultation given CT scan findings. Thanks for consultation.   LOS: 0 Rowene Suto 9/8/201710:10 AM

## 2015-11-14 NOTE — Progress Notes (Signed)
Inpatient Diabetes Program Recommendations  AACE/ADA: New Consensus Statement on Inpatient Glycemic Control (2015)  Target Ranges:  Prepandial:   less than 140 mg/dL      Peak postprandial:   less than 180 mg/dL (1-2 hours)      Critically ill patients:  140 - 180 mg/dL   Lab Results  Component Value Date   GLUCAP 178 (H) 11/14/2015   HGBA1C 8.7 (H) 08/10/2013    Review of Glycemic Control  Results for CRISTIE, [facility] (MRN [id number]) as of 11/14/2015 15:09  Ref. Range 11/14/2015 01:54 11/14/2015 07:45  Glucose-Capillary Latest Ref Range: 65 - 99 mg/dL 191 (H) 178 (H)    Diabetes history: Type 2 Outpatient Diabetes medications: none noted on her med list but on MD note dated 07/31/15, patient had Glipizide 5mg  /day ordered  Current orders for Inpatient glycemic control: none  Inpatient Diabetes Program Recommendations:  Consider ordering CBG and Novolog sensitive correction (0-9 units) q6h   Per ADA recommendations "consider performing an A1C on all patients with diabetes or hyperglycemia admitted to the hospital if not performed in the prior 3 months".   Gentry Fitz, RN, BA, MHA, CDE Diabetes Coordinator Inpatient Diabetes Program  [phone] (Team Pager) [phone] ([place]) 11/14/2015 3:16 PM

## 2015-11-14 NOTE — Progress Notes (Signed)
Intrepreter Rafael in to see pt. Explained care to pt. Pt verbalized understanding.

## 2015-11-14 NOTE — Progress Notes (Signed)
Initial Nutrition Assessment  DOCUMENTATION CODES:   Not applicable  INTERVENTION:  -Monitor diet progression and need for supplementation.    NUTRITION DIAGNOSIS:   Inadequate oral intake related to altered GI function as evidenced by NPO status.    GOAL:   Patient will meet greater than or equal to 90% of their needs    MONITOR:   Diet advancement  REASON FOR ASSESSMENT:   Malnutrition Screening Tool    ASSESSMENT:     50 y.o. Female with history of DM type 2, HTN, multiple myeloma on chemotherapy, osteoarthritis, CKD stage III admitted with abdominal pain and acute renal failure and hyperkalemia. Planning to start HD today per nephrology.  Noted per MD CT with inflammation around pancreas with normal lipase  Pt currently NPO at this time  Medications reviewed: Labs reviewed: K 5.9, phosphorus 4.9, glucose 201, BUN 85, creatinine 5.68   Unable to complete Nutrition-Focused physical exam at this time.    Diet Order:  Diet NPO time specified  Skin:  Reviewed, no issues  Last BM:  9/7, noted distended abdomen  Height:   Ht Readings from Last 1 Encounters:  10/06/15 5' 3"  (1.6 m)    Weight: Reviewed wt encounters and noted wt gain just prior to admission but from 03/2015-10/2015 8% wt loss noted in the last 7 months  Wt Readings from Last 1 Encounters:  11/14/15 130 lb 1.1 oz (59 kg)   Wt Readings from Last 10 Encounters:  11/14/15 130 lb 1.1 oz (59 kg)  10/27/15 115 lb 4.8 oz (52.3 kg)  10/06/15 114 lb 3.2 oz (51.8 kg)  09/22/15 124 lb (56.2 kg)  09/15/15 120 lb 5.9 oz (54.6 kg)  08/21/15 121 lb 5.8 oz (55 kg)  07/31/15 121 lb 11.1 oz (55.2 kg)  05/29/15 125 lb 10.6 oz (57 kg)  04/03/15 125 lb 14.1 oz (57.1 kg)  02/06/15 124 lb 12.5 oz (56.6 kg)    Ideal Body Weight:     BMI:  Body mass index is 23.04 kg/m.  Estimated Nutritional Needs:   Kcal:  1770-2000 kcals/d  Protein:  71-89 g/d  Fluid:  1037m + UOP  EDUCATION NEEDS:   No  education needs identified at this time  Marjorie Lussier B. AZenia Resides RBenson [place](pager) Weekend/On-Call pager ([phone]

## 2015-11-14 NOTE — Progress Notes (Signed)
POST DIALYSIS ASSESSMENT 

## 2015-11-14 NOTE — Progress Notes (Signed)
Pre dialysis assessment 

## 2015-11-14 NOTE — H&P (Signed)
PULMONARY / CRITICAL CARE MEDICINE   Name: Jamie Keller MRN: [id number] DOB: 06-28-1965    ADMISSION DATE:  11/13/2015 CONSULTATION DATE:  11/13/15  ATTESTATION NOTE:  [name] with MM, presented with respiratory distress. She was found to have acidosis and ARF with hyperkalemia, she was put on a bicarb drip and bipap. This morning she is doing better and notes that her breathing is much better, her family is at bedside and also note that she looks much better overall.  Case discussed on multidisciplinary rounds, and with nephro. Will plan for placement of HD catheter, continue supportive measures today.   Marda Stalker, M.D.  11/14/2015   Critical Care Attestation.  I have personally obtained a history, examined the patient, evaluated laboratory and imaging results, formulated the assessment and plan and placed orders. The Patient requires high complexity decision making for assessment and support, frequent evaluation and titration of therapies, application of advanced monitoring technologies and extensive interpretation of multiple databases. The patient has critical illness that could lead imminently to failure of 1 or more organ systems and requires the highest level of physician preparedness to intervene.  Critical Care Time devoted to patient care services described in this note is 45 minutes and is exclusive of time spent in procedures.      REFERRING MD:  Dr. Gwyndolyn Saxon CHIEF COMPLAINT:  Acute shortness of breath  HISTORY OF PRESENT ILLNESS:   [name] is a 82 year with past medical Hx of Multiple Myeloma, Diabetes Melitus, and hypertension.  Patient  Presented to ED on 9/7 with labored respiration.  Patient has not received any chemotherapy due to low blood counts.  Patient complaints of abdominal distension since Monday , poor bowel movement since then.  She has been very weak and short of breath. She denies any fever, chills , chest pain.  PAST  MEDICAL HISTORY :  She  has a past medical history of Diabetes mellitus without complication ([place]); Hypertension; Multiple myeloma ([place]); Osteoarthritis; and Post-menopausal (2014).  PAST SURGICAL HISTORY: She  has a past surgical history that includes Cesarean section.  Allergies  Allergen Reactions  . No Known Allergies     No current facility-administered medications on file prior to encounter.    Current Outpatient Prescriptions on File Prior to Encounter  Medication Sig  . gabapentin (NEURONTIN) 300 MG capsule Take 1 capsule (300 mg total) by mouth 3 (three) times daily.  . megestrol (MEGACE) 40 MG tablet Take 1 tablet (40 mg total) by mouth daily.  . Oxycodone HCl 10 MG TABS Take 1 tablet (10 mg total) by mouth every 6 (six) hours as needed.  . verapamil (CALAN) 80 MG tablet Take 80 mg by mouth.  . sucralfate (CARAFATE) 1 g tablet Take 1 tablet (1 g total) by mouth 2 (two) times daily. (Patient not taking: Reported on 10/06/2015)  . Vitamin D, Cholecalciferol, 400 UNITS CAPS Take 400 mg by mouth 2 (two) times daily. Reported on 07/31/2015    FAMILY HISTORY:  Her indicated that the status of her mother is unknown. She indicated that the status of her father is unknown.    SOCIAL HISTORY: She  reports that she has never smoked. She has never used smokeless tobacco. She reports that she does not drink alcohol or use drugs.  REVIEW OF SYSTEMS:   Unable to obtain due to BiPAP  SUBJECTIVE:  Unable to obtain VITAL SIGNS: BP (!) 154/65   Pulse 82   Temp 97.3  F (36.3 C) (Axillary)   Resp (!) 25   Wt 54.4 kg (120 lb)   SpO2 100%   BMI 21.26 kg/m   HEMODYNAMICS:    VENTILATOR SETTINGS:    INTAKE / OUTPUT: No intake/output data recorded.  PHYSICAL EXAMINATION: General:  Sickly appearing, Hispanic female, in moderate distress Neuro: Awake, alert, oriented, follows command, no focal deficits HEENT:  Atraumatic, Normocephalic, no JVD appreciated,  PERRLA Cardiovascular:  S1S2,RRR, No MRG Lungs: Expiratory wheezes , no crackles, rhonchi noted Abdomen: distended abdomen, active bowel sounds Musculoskeletal: No inflammation/deformity noted Skin:  Grossly intact  LABS:  BMET  Recent Labs Lab 11/13/15 2138 11/14/15 0000  NA 135 137  K 6.2* 5.6*  CL 109 113*  CO2 11* 9*  BUN 89* 82*  CREATININE 6.29* 5.67*  GLUCOSE 200* 240*    Electrolytes  Recent Labs Lab 11/13/15 2138 11/14/15 0000  CALCIUM 9.2 9.9    CBC  Recent Labs Lab 11/13/15 2138  WBC 8.8  HGB 8.7*  HCT 26.8*  PLT 184    Coag's No results for input(s): APTT, INR in the last 168 hours.  Sepsis Markers  Recent Labs Lab 11/14/15 0000  LATICACIDVEN 7.6*    ABG No results for input(s): PHART, PCO2ART, PO2ART in the last 168 hours.  Liver Enzymes  Recent Labs Lab 11/13/15 2138  AST 249*  ALT 272*  ALKPHOS 142*  BILITOT 1.4*  ALBUMIN 3.3*    Cardiac Enzymes  Recent Labs Lab 11/13/15 2138  TROPONINI <0.03    Glucose No results for input(s): GLUCAP in the last 168 hours.  Imaging Dg Chest 1 View  Result Date: 11/13/2015 CLINICAL DATA:  Pallor. Abdominal pain and shortness of breath. Multiple myeloma. EXAM: CHEST 1 VIEW COMPARISON:  09/22/2015 FINDINGS: Power injectable right IJ Port-A-Cath tip: SVC. Mild enlargement of the cardiopericardial silhouette with bibasilar airspace opacities obscuring the hemidiaphragms. Old bilateral lateral lower rib fractures. Thoracic spondylosis. IMPRESSION: 1. Obscuration of both hemidiaphragms is increased from 09/22/2015 and may reflect atelectasis or pneumonia. The underlying pleural effusions are not readily excluded either. 2. Mild enlargement of the cardiopericardial silhouette. Electronically Signed   By: Van Clines M.D.   On: 11/13/2015 23:07     STUDIES:  09/02/15 CT Abdomen pelvis >>. 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 Started on Bicarbonate drip. Acute tubular necrosis with Cr- 6.29. On BiPAP and critically ill.  ASSESSMENT / PLAN:  PULMONARY A: Acute respiratory failure PNA Severe metabolic acidosis with full respiratory compensation P Continue On BiPAP to keep sats >92% HCO3 gtt Routine ABG Vanc/zosyn CXR in am  CARDIOVASCULAR A:  Hx of Hypertension P:  Continuous telemetry Verapamill  RENAL A:   Acute tubular Necrosis Hyperkalemia P:   Strict I/O Monitor renal function closely Received 2l of fluid in the ER Received d-50 and insulin in ED 1gm of CaCl in ED  GASTROINTESTINAL A:   Abdominal pain Nausea P:   NPO pepcid Oxycodone zofran  HEMATOLOGIC A:   Multiple myeloma Anemia of chronic disease P:  Transfused one unit of blood velcade held(8/28) due  to low platelet count Lovenox for DVT prophylaxis  INFECTIOUS A:   PNA Elevated lactic acid  P:   Monitor fever curve Trend lactic acid Vanc/zosyn  ENDOCRINE A:   Diabetes Melitus P:   POCT Q4 hours SSI coverage  NEUROLOGIC A:   No active issues  P:   RASS goal: 0 Minimize medications with sedation       Bincy Varughese,AG-ACNP Pulmonary and [place]   11/14/2015, 1:59 AM

## 2015-11-15 ENCOUNTER — Encounter: Payer: Self-pay | Admitting: *Deleted

## 2015-11-15 LAB — CBC
HCT: 23.7 % — ABNORMAL LOW (ref 35.0–47.0)
Hemoglobin: 8.4 g/dL — ABNORMAL LOW (ref 12.0–16.0)
MCH: 30.8 pg (ref 26.0–34.0)
MCHC: 35.6 g/dL (ref 32.0–36.0)
MCV: 86.6 fL (ref 80.0–100.0)
PLATELETS: 121 10*3/uL — AB (ref 150–440)
RBC: 2.73 MIL/uL — ABNORMAL LOW (ref 3.80–5.20)
RDW: 18.3 % — AB (ref 11.5–14.5)
WBC: 4.8 10*3/uL (ref 3.6–11.0)

## 2015-11-15 LAB — PARATHYROID HORMONE, INTACT (NO CA): PTH: 107 pg/mL — ABNORMAL HIGH (ref 15–65)

## 2015-11-15 LAB — BASIC METABOLIC PANEL
ANION GAP: 11 (ref 5–15)
BUN: 52 mg/dL — AB (ref 6–20)
CALCIUM: 7.5 mg/dL — AB (ref 8.9–10.3)
CO2: 30 mmol/L (ref 22–32)
Chloride: 98 mmol/L — ABNORMAL LOW (ref 101–111)
Creatinine, Ser: 4.64 mg/dL — ABNORMAL HIGH (ref 0.44–1.00)
GFR calc Af Amer: 12 mL/min — ABNORMAL LOW (ref >60)
GFR, EST NON AFRICAN AMERICAN: 10 mL/min — AB (ref >60)
GLUCOSE: 105 mg/dL — AB (ref 65–99)
Potassium: 3.6 mmol/L (ref 3.5–5.1)
SODIUM: 139 mmol/L (ref 135–145)

## 2015-11-15 LAB — PHOSPHORUS: Phosphorus: 4.5 mg/dL (ref 2.5–4.6)

## 2015-11-15 LAB — GLUCOSE, CAPILLARY
GLUCOSE-CAPILLARY: 101 mg/dL — AB (ref 65–99)
GLUCOSE-CAPILLARY: 115 mg/dL — AB (ref 65–99)
GLUCOSE-CAPILLARY: 157 mg/dL — AB (ref 65–99)
Glucose-Capillary: 109 mg/dL — ABNORMAL HIGH (ref 65–99)
Glucose-Capillary: 137 mg/dL — ABNORMAL HIGH (ref 65–99)
Glucose-Capillary: 97 mg/dL (ref 65–99)

## 2015-11-15 LAB — TYPE AND SCREEN
ABO/RH(D): B POS
ANTIBODY SCREEN: NEGATIVE
UNIT DIVISION: 0

## 2015-11-15 LAB — HEPATITIS C ANTIBODY

## 2015-11-15 LAB — HEPATITIS B SURFACE ANTIBODY, QUANTITATIVE: Hepatitis B-Post: <3.1 m[IU]/mL — ABNORMAL LOW (ref >9.9)

## 2015-11-15 LAB — MAGNESIUM: MAGNESIUM: 1.5 mg/dL — AB (ref 1.7–2.4)

## 2015-11-15 LAB — HEPATITIS B SURFACE ANTIGEN: Hepatitis B Surface Ag: NEGATIVE

## 2015-11-15 LAB — HEPATITIS B CORE ANTIBODY, TOTAL: Hep B Core Total Ab: NEGATIVE

## 2015-11-15 NOTE — Progress Notes (Signed)
PRE DIALYSIS ASSESSMENT 

## 2015-11-15 NOTE — Progress Notes (Signed)
POST TREATMENT ASSESSMENT 

## 2015-11-15 NOTE — Progress Notes (Signed)
Used interpreter Rafeal - educated on medications for this shift, importance of measuring urine, q4 CBGs, explained physical assessment and heart monitor. Pt's husband at bedside. Answered all questions and stated that she had no further questions.

## 2015-11-15 NOTE — Progress Notes (Signed)
[facility] [place] NAME: Jamie Keller    MR#:  [id number]  DATE OF BIRTH:  10/10/1965  SUBJECTIVE:  CHIEF COMPLAINT:   Chief Complaint  Patient presents with  . Abdominal Pain  . Respiratory Distress   No further shortness of breath. Continues off upper abdominal pain. No vomiting. Has chronic mild diarrhea which is unchanged.  REVIEW OF SYSTEMS:    Review of Systems  Constitutional: Negative for chills and fever.  HENT: Negative for sore throat.   Eyes: Negative for blurred vision, double vision and pain.  Respiratory: Negative for cough, hemoptysis, shortness of breath and wheezing.   Cardiovascular: Negative for chest pain, palpitations, orthopnea and leg swelling.  Gastrointestinal: Positive for abdominal pain. Negative for constipation, diarrhea, heartburn, nausea and vomiting.  Genitourinary: Negative for dysuria and hematuria.  Musculoskeletal: Negative for back pain and joint pain.  Skin: Negative for rash.  Neurological: Negative for sensory change, speech change, focal weakness and headaches.  Endo/Heme/Allergies: Does not bruise/bleed easily.  Psychiatric/Behavioral: Negative for depression. The patient is not nervous/anxious.     DRUG ALLERGIES:   Allergies  Allergen Reactions  . No Known Allergies     VITALS:  Blood pressure (!) 111/99, pulse 98, temperature 98.1 F (36.7 C), temperature source Oral, resp. rate 18, weight 58.5 kg (128 lb 14.4 oz), SpO2 93 %.  PHYSICAL EXAMINATION:   Physical Exam  GENERAL:  50 y.o.-year-old patient lying in the bed with no acute distress.  EYES: Pupils equal, round, reactive to light and accommodation. No scleral icterus. Extraocular muscles intact.  HEENT: Head atraumatic, normocephalic. Oropharynx and nasopharynx clear.  NECK:  Supple, no jugular venous distention. No thyroid enlargement, no tenderness.  LUNGS: Normal breath sounds bilaterally, no wheezing,  rales, rhonchi. No use of accessory muscles of respiration.  CARDIOVASCULAR: S1, S2 normal. No murmurs, rubs, or gallops.  ABDOMEN: Soft, nondistended. Bowel sounds present. No organomegaly or mass. Mild epigastric area tenderness EXTREMITIES: No cyanosis, clubbing or edema b/l.    NEUROLOGIC: Cranial nerves II through XII are intact. No focal Motor or sensory deficits b/l.   PSYCHIATRIC: The patient is alert and oriented x 3.  SKIN: No obvious rash, lesion, or ulcer.   LABORATORY PANEL:   CBC  Recent Labs Lab 11/15/15 0532  WBC 4.8  HGB 8.4*  HCT 23.7*  PLT 121*   ------------------------------------------------------------------------------------------------------------------ Chemistries   Recent Labs Lab 11/13/15 2138  11/15/15 0532  NA 135  < > 139  K 6.2*  < > 3.6  CL 109  < > 98*  CO2 11*  < > 30  GLUCOSE 200*  < > 105*  BUN 89*  < > 52*  CREATININE 6.29*  < > 4.64*  CALCIUM 9.2  < > 7.5*  MG  --   < > 1.5*  AST 249*  --   --   ALT 272*  --   --   ALKPHOS 142*  --   --   BILITOT 1.4*  --   --   < > = values in this interval not displayed. ------------------------------------------------------------------------------------------------------------------  Cardiac Enzymes  Recent Labs Lab 11/13/15 2138  TROPONINI <0.03   ------------------------------------------------------------------------------------------------------------------  RADIOLOGY:  Ct Abdomen Pelvis Wo Contrast  Result Date: 11/14/2015 CLINICAL DATA:  Acute onset of generalized abdominal distention. Generalized weakness and near syncope. Shortness of breath. Current history of multiple myeloma. Chills and vomiting. Initial encounter. EXAM: CT ABDOMEN AND PELVIS WITHOUT CONTRAST TECHNIQUE: Multidetector  CT imaging of the abdomen and pelvis was performed following the standard protocol without IV contrast. COMPARISON:  CT of the abdomen and pelvis performed 09/02/2015 FINDINGS: Lower chest: Small  bilateral pleural effusions are noted, right greater than left, with bibasilar airspace opacities, possibly reflecting atelectasis or pneumonia. Hepatobiliary: The liver is unremarkable in appearance. Trace ascites is seen tracking about the liver and spleen. The gallbladder is grossly unremarkable in appearance. The common bile duct remains normal in caliber. Pancreas: Vague soft tissue inflammation is suggested about the pancreas, with fluid tracking inferiorly along the duodenum and Gerota's fascia on the right, concerning for acute pancreatitis. Evaluation for devascularization is limited without contrast. No pseudocysts are seen. Spleen: The spleen is unremarkable in appearance. Adrenals/Urinary Tract: The adrenal glands are grossly unremarkable. Nonspecific perinephric stranding is noted bilaterally. There is no evidence of hydronephrosis. No renal or ureteral stones are identified. Stomach/Bowel: The stomach is grossly unremarkable in appearance. No significant small bowel abnormalities are seen. The appendix is normal in caliber, without evidence of appendicitis. The colon is decompressed and grossly unremarkable in appearance. Vascular/Lymphatic: The vasculature is not well assessed without contrast. Minimal vascular calcification is seen. No retroperitoneal lymphadenopathy is seen. No pelvic sidewall lymphadenopathy is identified. Reproductive: The bladder is mildly distended and grossly unremarkable. The uterus is grossly unremarkable in appearance. The ovaries are relatively symmetric. No suspicious adnexal masses are seen. Other: A small amount of fluid within the pelvis. Presacral stranding is noted. Musculoskeletal: There is diffuse sclerosis and numerous lytic lesions throughout the visualized osseous structures, with chronic loss of height noted at T11. The visualized musculature is grossly unremarkable in appearance. IMPRESSION: 1. Vague soft tissue inflammation about the pancreas, with fluid  tracking inferiorly along the duodenum and Gerota's fascia on the right, concerning for acute pancreatitis. Would correlate with pancreatic lab values. No pseudocyst seen. Evaluation for devascularization is limited without contrast. 2. Trace ascites noted within the abdomen and pelvis. 3. Small bilateral pleural effusions, right greater than left, with bibasilar airspace opacities possibly reflecting atelectasis or pneumonia. 4. Diffuse sclerosis and numerous lytic lesions throughout the visualized osseous structures, with chronic loss of height at T11. This reflects the patient's known multiple myeloma. Electronically Signed   By: Garald Balding M.D.   On: 11/14/2015 02:32   Dg Chest 1 View  Result Date: 11/13/2015 CLINICAL DATA:  Pallor. Abdominal pain and shortness of breath. Multiple myeloma. EXAM: CHEST 1 VIEW COMPARISON:  09/22/2015 FINDINGS: Power injectable right IJ Port-A-Cath tip: SVC. Mild enlargement of the cardiopericardial silhouette with bibasilar airspace opacities obscuring the hemidiaphragms. Old bilateral lateral lower rib fractures. Thoracic spondylosis. IMPRESSION: 1. Obscuration of both hemidiaphragms is increased from 09/22/2015 and may reflect atelectasis or pneumonia. The underlying pleural effusions are not readily excluded either. 2. Mild enlargement of the cardiopericardial silhouette. Electronically Signed   By: Van Clines M.D.   On: 11/13/2015 23:07     ASSESSMENT AND PLAN:   * Acute kidney injury over CKD3 Patient has been started on dialysis. Monitor urine output. BUN and creatinine. Will need to check with nephrology regarding continuing dialysis. No urinary symptoms.  * Mild acute pancreatitis Improving  * Anemia of chronic disease is stable  * Multiple myeloma Follow-up as outpatient  All the records are reviewed and case discussed with Care Management/Social Workerr. Management plans discussed with the patient, family and they are in  agreement.  CODE STATUS: FULL CODE  DVT Prophylaxis: SCDs  TOTAL TIME TAKING CARE OF THIS PATIENT: 35  minutes.   POSSIBLE D/C IN 2-3 DAYS, DEPENDING ON CLINICAL CONDITION.  Hillary Bow R M.D on 11/15/2015 at 9:44 AM  Between 7am to 6pm - Pager - [phone]  After 6pm go to [email or web address] - password [email or web address] [facility]  Office  [phone]  CC: Primary care physician; [name], MD  Note: This dictation was prepared with Dragon dictation along with smaller phrase technology. Any transcriptional errors that result from this process are unintentional.

## 2015-11-15 NOTE — Progress Notes (Signed)
HD STARTED  

## 2015-11-15 NOTE — Progress Notes (Signed)
[facility]  ROUNDING NOTE   Subjective:  Patient states that she is starting to make a bit more urine. BUN currently 52 with a creatinine of 4.6. However documented urine output was only 200 cc. We plan to proceed with hemodialysis today. Serum potassium is come down to 3.6.   Objective:  Vital signs in last 24 hours:  Temp:  [97.6 F (36.4 C)-98.4 F (36.9 C)] 98.3 F (36.8 C) (09/09 1100) Pulse Rate:  [79-99] 95 (09/09 1130) Resp:  [9-27] 14 (09/09 1130) BP: (106-139)/(55-99) 116/56 (09/09 1130) SpO2:  [92 %-100 %] 95 % (09/09 1050) Weight:  [58.5 kg (128 lb 14.4 oz)-60.2 kg (132 lb 11.5 oz)] 60.2 kg (132 lb 11.5 oz) (09/09 1100)  Weight change: 4.568 kg (10 lb 1.1 oz) Filed Weights   11/14/15 1330 11/15/15 0500 11/15/15 1100  Weight: 59 kg (130 lb 1.1 oz) 58.5 kg (128 lb 14.4 oz) 60.2 kg (132 lb 11.5 oz)    Intake/Output: I/O last 3 completed shifts: In: 5125 [I.V.:4452; Blood:673] Out: 350 [Urine:350]   Intake/Output this shift:  Total I/O In: 5597 [P.O.:120; I.V.:1372] Out: -   Physical Exam: General: No acute distress, resting in bed  Head: Normocephalic, atraumatic. Moist oral mucosal membranes  Eyes: Anicteric  Neck: Supple, trachea midline  Lungs:  Clear to auscultation, normal effort  Heart: S1S2 no rubs  Abdomen:  Soft, mid upper abdominal tenderness noted  Extremities: no peripheral edema.  Neurologic: Nonfocal, moving all four extremities  Skin: No lesions  Access:     Basic Metabolic Panel:  Recent Labs Lab 11/13/15 2138 11/14/15 0000 11/14/15 0326 11/14/15 1335 11/15/15 0532 11/15/15 1100  NA 135 137 135  --  139  --   K 6.2* 5.6* 5.9*  --  3.6  --   CL 109 113* 110  --  98*  --   CO2 11* 9* 15*  --  30  --   GLUCOSE 200* 240* 201*  --  105*  --   BUN 89* 82* 85*  --  52*  --   CREATININE 6.29* 5.67* 5.68*  --  4.64*  --   CALCIUM 9.2 9.9 9.1  --  7.5*  --   MG  --   --  2.0  --  1.5*  --   PHOS  --   --  6.7* 4.9*   --  4.5    Liver Function Tests:  Recent Labs Lab 11/13/15 2138  AST 249*  ALT 272*  ALKPHOS 142*  BILITOT 1.4*  PROT 7.0  ALBUMIN 3.3*    Recent Labs Lab 11/14/15 0326  LIPASE 44   No results for input(s): AMMONIA in the last 168 hours.  CBC:  Recent Labs Lab 11/13/15 2138 11/14/15 0326 11/15/15 0532  WBC 8.8 7.2 4.8  NEUTROABS 5.7  --   --   HGB 8.7* 9.2* 8.4*  HCT 26.8* 26.3* 23.7*  MCV 94.6 89.2 86.6  PLT 184 123* 121*    Cardiac Enzymes:  Recent Labs Lab 11/13/15 2138  TROPONINI <0.03    BNP: Invalid input(s): POCBNP  CBG:  Recent Labs Lab 11/14/15 1556 11/14/15 2017 11/15/15 0004 11/15/15 0442 11/15/15 0740  GLUCAP 91 133* 137* 101* 78    Microbiology: Results for orders placed or performed during the hospital encounter of 11/13/15  MRSA PCR Screening     Status: None   Collection Time: 11/14/15  3:11 AM  Result Value Ref Range Status   MRSA by PCR  NEGATIVE NEGATIVE Final    Comment:        The GeneXpert MRSA Assay (FDA approved for NASAL specimens only), is one component of a comprehensive MRSA colonization surveillance program. It is not intended to diagnose MRSA infection nor to guide or monitor treatment for MRSA infections.     Coagulation Studies: No results for input(s): LABPROT, INR in the last 72 hours.  Urinalysis: No results for input(s): COLORURINE, LABSPEC, PHURINE, GLUCOSEU, HGBUR, BILIRUBINUR, KETONESUR, PROTEINUR, UROBILINOGEN, NITRITE, LEUKOCYTESUR in the last 72 hours.  Invalid input(s): APPERANCEUR    Imaging: Ct Abdomen Pelvis Wo Contrast  Result Date: 11/14/2015 CLINICAL DATA:  Acute onset of generalized abdominal distention. Generalized weakness and near syncope. Shortness of breath. Current history of multiple myeloma. Chills and vomiting. Initial encounter. EXAM: CT ABDOMEN AND PELVIS WITHOUT CONTRAST TECHNIQUE: Multidetector CT imaging of the abdomen and pelvis was performed following the  standard protocol without IV contrast. COMPARISON:  CT of the abdomen and pelvis performed 09/02/2015 FINDINGS: Lower chest: Small bilateral pleural effusions are noted, right greater than left, with bibasilar airspace opacities, possibly reflecting atelectasis or pneumonia. Hepatobiliary: The liver is unremarkable in appearance. Trace ascites is seen tracking about the liver and spleen. The gallbladder is grossly unremarkable in appearance. The common bile duct remains normal in caliber. Pancreas: Vague soft tissue inflammation is suggested about the pancreas, with fluid tracking inferiorly along the duodenum and Gerota's fascia on the right, concerning for acute pancreatitis. Evaluation for devascularization is limited without contrast. No pseudocysts are seen. Spleen: The spleen is unremarkable in appearance. Adrenals/Urinary Tract: The adrenal glands are grossly unremarkable. Nonspecific perinephric stranding is noted bilaterally. There is no evidence of hydronephrosis. No renal or ureteral stones are identified. Stomach/Bowel: The stomach is grossly unremarkable in appearance. No significant small bowel abnormalities are seen. The appendix is normal in caliber, without evidence of appendicitis. The colon is decompressed and grossly unremarkable in appearance. Vascular/Lymphatic: The vasculature is not well assessed without contrast. Minimal vascular calcification is seen. No retroperitoneal lymphadenopathy is seen. No pelvic sidewall lymphadenopathy is identified. Reproductive: The bladder is mildly distended and grossly unremarkable. The uterus is grossly unremarkable in appearance. The ovaries are relatively symmetric. No suspicious adnexal masses are seen. Other: A small amount of fluid within the pelvis. Presacral stranding is noted. Musculoskeletal: There is diffuse sclerosis and numerous lytic lesions throughout the visualized osseous structures, with chronic loss of height noted at T11. The visualized  musculature is grossly unremarkable in appearance. IMPRESSION: 1. Vague soft tissue inflammation about the pancreas, with fluid tracking inferiorly along the duodenum and Gerota's fascia on the right, concerning for acute pancreatitis. Would correlate with pancreatic lab values. No pseudocyst seen. Evaluation for devascularization is limited without contrast. 2. Trace ascites noted within the abdomen and pelvis. 3. Small bilateral pleural effusions, right greater than left, with bibasilar airspace opacities possibly reflecting atelectasis or pneumonia. 4. Diffuse sclerosis and numerous lytic lesions throughout the visualized osseous structures, with chronic loss of height at T11. This reflects the patient's known multiple myeloma. Electronically Signed   By: Garald Balding M.D.   On: 11/14/2015 02:32   Dg Chest 1 View  Result Date: 11/13/2015 CLINICAL DATA:  Pallor. Abdominal pain and shortness of breath. Multiple myeloma. EXAM: CHEST 1 VIEW COMPARISON:  09/22/2015 FINDINGS: Power injectable right IJ Port-A-Cath tip: SVC. Mild enlargement of the cardiopericardial silhouette with bibasilar airspace opacities obscuring the hemidiaphragms. Old bilateral lateral lower rib fractures. Thoracic spondylosis. IMPRESSION: 1. Obscuration of both hemidiaphragms is  increased from 09/22/2015 and may reflect atelectasis or pneumonia. The underlying pleural effusions are not readily excluded either. 2. Mild enlargement of the cardiopericardial silhouette. Electronically Signed   By: Van Clines M.D.   On: 11/13/2015 23:07     Medications:   .  sodium bicarbonate 150 mEq in sterile water 1000 mL infusion 125 mL/hr at 11/15/15 0413   . sodium chloride  250 mL Intravenous Once  . famotidine (PEPCID) IV  20 mg Intravenous Q24H  . heparin subcutaneous  5,000 Units Subcutaneous Q12H  . iopamidol  30 mL Oral Once  . piperacillin-tazobactam (ZOSYN)  IV  3.375 g Intravenous Q12H  . verapamil  80 mg Oral Daily    sodium chloride, sodium chloride, sodium chloride, alteplase, heparin, heparin lock flush, heparin lock flush, lidocaine (PF), lidocaine-prilocaine, oxyCODONE, pentafluoroprop-tetrafluoroeth, sodium chloride flush, sodium chloride flush  Assessment/ Plan:  50 y.o. female with past medical history of diabetes mellitus type 2, hypertension, multiple myeloma on chemotherapy, osteoarthritis, chronic kidney disease stage III who presents now with abdominal pain and acute renal failure.  1. Acute renal failure/CKD stage III baseline Cr 1.4 from 8/28/217. 2. Hyperkalemia. 3. Abdominal pain with inflammation around the pancreas noted on CT scan of the abdomen and pelvis with normal lipase. 4. Metabolic acidosis with serum bicarbonate of 15. 5. Anemia likely related to chronic kidney disease and multiple myeloma.  Plan: We currently doubt that the patient's acute renal failure is likely related to her underlying multiple myeloma.  Most recent M spike was 0.3 g/dL. Patient reports that her urine output is increasing however this does not appear to be documented. For now we will plan for hemodialysis today and reassess for additional dialysis treatment tomorrow. Continue to monitor renal parameters as well as urine output closely. We will follow along with you.   LOS: 1 Bryar Dahms 9/9/201711:39 AM

## 2015-11-15 NOTE — Progress Notes (Signed)
HD COMPLETED  

## 2015-11-15 NOTE — Progress Notes (Signed)
Spoke with Dr. Jannifer Franklin about continuous pulse ox order - MD discontinued.

## 2015-11-16 LAB — GLUCOSE, CAPILLARY
GLUCOSE-CAPILLARY: 120 mg/dL — AB (ref 65–99)
GLUCOSE-CAPILLARY: 121 mg/dL — AB (ref 65–99)
GLUCOSE-CAPILLARY: 123 mg/dL — AB (ref 65–99)
Glucose-Capillary: 138 mg/dL — ABNORMAL HIGH (ref 65–99)
Glucose-Capillary: 144 mg/dL — ABNORMAL HIGH (ref 65–99)
Glucose-Capillary: 187 mg/dL — ABNORMAL HIGH (ref 65–99)
Glucose-Capillary: 192 mg/dL — ABNORMAL HIGH (ref 65–99)

## 2015-11-16 LAB — BASIC METABOLIC PANEL
Anion gap: 11 (ref 5–15)
BUN: 36 mg/dL — AB (ref 6–20)
CHLORIDE: 97 mmol/L — AB (ref 101–111)
CO2: 34 mmol/L — AB (ref 22–32)
Calcium: 7.3 mg/dL — ABNORMAL LOW (ref 8.9–10.3)
Creatinine, Ser: 3.95 mg/dL — ABNORMAL HIGH (ref 0.44–1.00)
GFR calc Af Amer: 14 mL/min — ABNORMAL LOW (ref >60)
GFR calc non Af Amer: 12 mL/min — ABNORMAL LOW (ref >60)
GLUCOSE: 127 mg/dL — AB (ref 65–99)
POTASSIUM: 3.5 mmol/L (ref 3.5–5.1)
Sodium: 142 mmol/L (ref 135–145)

## 2015-11-16 MED ORDER — FAMOTIDINE 20 MG PO TABS
20.0000 mg | ORAL_TABLET | Freq: Every day | ORAL | Status: DC
  Administered 2015-11-17 – 2015-11-20 (×4): 20 mg via ORAL
  Filled 2015-11-16 (×4): qty 1

## 2015-11-16 NOTE — Progress Notes (Signed)
[facility] [place] NAME: Annia Gomm    MR#:  [id number]  DATE OF BIRTH:  May 29, 1965  SUBJECTIVE:  CHIEF COMPLAINT:   Chief Complaint  Patient presents with  . Abdominal Pain  . Respiratory Distress   No further shortness of breath. Mild upper abdominal pain. No vomiting. Has chronic mild diarrhea which is unchanged.  REVIEW OF SYSTEMS:    Review of Systems  Constitutional: Negative for chills and fever.  HENT: Negative for sore throat.   Eyes: Negative for blurred vision, double vision and pain.  Respiratory: Negative for cough, hemoptysis, shortness of breath and wheezing.   Cardiovascular: Negative for chest pain, palpitations, orthopnea and leg swelling.  Gastrointestinal: Positive for abdominal pain. Negative for constipation, diarrhea, heartburn, nausea and vomiting.  Genitourinary: Negative for dysuria and hematuria.  Musculoskeletal: Negative for back pain and joint pain.  Skin: Negative for rash.  Neurological: Negative for sensory change, speech change, focal weakness and headaches.  Endo/Heme/Allergies: Does not bruise/bleed easily.  Psychiatric/Behavioral: Negative for depression. The patient is not nervous/anxious.     DRUG ALLERGIES:   Allergies  Allergen Reactions  . No Known Allergies     VITALS:  Blood pressure (!) 102/48, pulse 100, temperature 98.2 F (36.8 C), temperature source Oral, resp. rate 20, height '5\' 5"'$  (1.651 m), weight 62.2 kg (137 lb 3.2 oz), SpO2 94 %.  PHYSICAL EXAMINATION:   Physical Exam  GENERAL:  50 y.o.-year-old patient lying in the bed with no acute distress.  EYES: Pupils equal, round, reactive to light and accommodation. No scleral icterus. Extraocular muscles intact.  HEENT: Head atraumatic, normocephalic. Oropharynx and nasopharynx clear.  NECK:  Supple, no jugular venous distention. No thyroid enlargement, no tenderness.  LUNGS: Normal breath sounds  bilaterally, no wheezing, rales, rhonchi. No use of accessory muscles of respiration.  CARDIOVASCULAR: S1, S2 normal. No murmurs, rubs, or gallops.  ABDOMEN: Soft, nondistended. Bowel sounds present. No organomegaly or mass. Mild epigastric area tenderness EXTREMITIES: No cyanosis, clubbing or edema b/l.    NEUROLOGIC: Cranial nerves II through XII are intact. No focal Motor or sensory deficits b/l.   PSYCHIATRIC: The patient is alert and oriented x 3.  SKIN: No obvious rash, lesion, or ulcer.   LABORATORY PANEL:   CBC  Recent Labs Lab 11/15/15 0532  WBC 4.8  HGB 8.4*  HCT 23.7*  PLT 121*   ------------------------------------------------------------------------------------------------------------------ Chemistries   Recent Labs Lab 11/13/15 2138  11/15/15 0532 11/16/15 0441  NA 135  < > 139 142  K 6.2*  < > 3.6 3.5  CL 109  < > 98* 97*  CO2 11*  < > 30 34*  GLUCOSE 200*  < > 105* 127*  BUN 89*  < > 52* 36*  CREATININE 6.29*  < > 4.64* 3.95*  CALCIUM 9.2  < > 7.5* 7.3*  MG  --   < > 1.5*  --   AST 249*  --   --   --   ALT 272*  --   --   --   ALKPHOS 142*  --   --   --   BILITOT 1.4*  --   --   --   < > = values in this interval not displayed. ------------------------------------------------------------------------------------------------------------------  Cardiac Enzymes  Recent Labs Lab 11/13/15 2138  TROPONINI <0.03   ------------------------------------------------------------------------------------------------------------------  RADIOLOGY:  No results found.   ASSESSMENT AND PLAN:   * Acute kidney injury over CKD3  Patient has been started on dialysis. Poor urine output. Oliguric. Monitor urine output. BUN and creatinine. No uremic symptoms.  * Mild acute pancreatitis Improving  * Anemia of chronic disease is stable  * Multiple myeloma Follow-up as outpatient with Dr. Grayland Ormond  All the records are reviewed and case discussed with Care  Management/Social Workerr. Management plans discussed with the patient, family and they are in agreement.  CODE STATUS: FULL CODE  DVT Prophylaxis: SCDs  TOTAL TIME TAKING CARE OF THIS PATIENT: 35 minutes.   POSSIBLE D/C IN 2-3 DAYS, DEPENDING ON CLINICAL CONDITION.  Hillary Bow R M.D on 11/16/2015 at 9:54 AM  Between 7am to 6pm - Pager - [phone]  After 6pm go to [email or web address] - password [email or web address] [facility]  Office  [phone]  CC: Primary care physician; [name], MD  Note: This dictation was prepared with Dragon dictation along with smaller phrase technology. Any transcriptional errors that result from this process are unintentional.

## 2015-11-16 NOTE — Progress Notes (Signed)
Key Points: Use following P&T approved IV to PO antibiotic change policy.  Description contains the criteria that are approved Note: Policy Excludes:  Esophagectomy patientsPHARMACIST - PHYSICIAN COMMUNICATION DR:   Darvin Neighbours CONCERNING: IV to Oral Route Change Policy  RECOMMENDATION: This patient is receiving famotidine by the intravenous route.  Based on criteria approved by the Pharmacy and Therapeutics Committee, the intravenous medication(s) is/are being converted to the equivalent oral dose form(s).   DESCRIPTION: These criteria include:  The patient is eating (either orally or via tube) and/or has been taking other orally administered medications for a least 24 hours  The patient has no evidence of active gastrointestinal bleeding or impaired GI absorption (gastrectomy, short bowel, patient on TNA or NPO).  If you have questions about this conversion, please contact the Pharmacy Department  []   [phone] )  [facility] []   [phone] )  [facility] []   [phone] )  [facility] []   [phone] )  [facility] []   [phone] )  [place], [facility] 11/16/2015 2:08 PM

## 2015-11-16 NOTE — Progress Notes (Signed)
[facility]  ROUNDING NOTE   Subjective:  Patient still has some mild epigastric abdominal pain. Tolerated dialysis well yesterday. Creatinine has come down under the influence of dialysis. Urine output only documented as 300 cc over the preceding 24 hours.  Objective:  Vital signs in last 24 hours:  Temp:  [98.2 F (36.8 C)-99.2 F (37.3 C)] 98.2 F (36.8 C) (09/10 0405) Pulse Rate:  [95-105] 100 (09/10 0405) Resp:  [14-20] 20 (09/10 0405) BP: (102-126)/(40-61) 102/48 (09/10 0405) SpO2:  [92 %-96 %] 94 % (09/10 0405) Weight:  [62.2 kg (137 lb 3.2 oz)] 62.2 kg (137 lb 3.2 oz) (09/10 0428)  Weight change: 1.2 kg (2 lb 10.3 oz) Filed Weights   11/15/15 0500 11/15/15 1100 11/16/15 0428  Weight: 58.5 kg (128 lb 14.4 oz) 60.2 kg (132 lb 11.5 oz) 62.2 kg (137 lb 3.2 oz)    Intake/Output: I/O last 3 completed shifts: In: 5792.3 [P.O.:120; I.V.:5593.1; IV Piggyback:79.2] Out: 300 [Urine:300]   Intake/Output this shift:  Total I/O In: 725 [P.O.:120; I.V.:580; IV Piggyback:25] Out: 500 [Urine:500]  Physical Exam: General: No acute distress, resting in bed  Head: Normocephalic, atraumatic. Moist oral mucosal membranes  Eyes: Anicteric  Neck: Supple, trachea midline  Lungs:  Clear to auscultation, normal effort  Heart: S1S2 no rubs  Abdomen:  Soft, mid upper abdominal tenderness noted  Extremities: no peripheral edema.  Neurologic: Nonfocal, moving all four extremities  Skin: No lesions  Access:     Basic Metabolic Panel:  Recent Labs Lab 11/13/15 2138 11/14/15 0000 11/14/15 0326 11/14/15 1335 11/15/15 0532 11/15/15 1100 11/16/15 0441  NA 135 137 135  --  139  --  142  K 6.2* 5.6* 5.9*  --  3.6  --  3.5  CL 109 113* 110  --  98*  --  97*  CO2 11* 9* 15*  --  30  --  34*  GLUCOSE 200* 240* 201*  --  105*  --  127*  BUN 89* 82* 85*  --  52*  --  36*  CREATININE 6.29* 5.67* 5.68*  --  4.64*  --  3.95*  CALCIUM 9.2 9.9 9.1  --  7.5*  --  7.3*  MG   --   --  2.0  --  1.5*  --   --   PHOS  --   --  6.7* 4.9*  --  4.5  --     Liver Function Tests:  Recent Labs Lab 11/13/15 2138  AST 249*  ALT 272*  ALKPHOS 142*  BILITOT 1.4*  PROT 7.0  ALBUMIN 3.3*    Recent Labs Lab 11/14/15 0326  LIPASE 44   No results for input(s): AMMONIA in the last 168 hours.  CBC:  Recent Labs Lab 11/13/15 2138 11/14/15 0326 11/15/15 0532  WBC 8.8 7.2 4.8  NEUTROABS 5.7  --   --   HGB 8.7* 9.2* 8.4*  HCT 26.8* 26.3* 23.7*  MCV 94.6 89.2 86.6  PLT 184 123* 121*    Cardiac Enzymes:  Recent Labs Lab 11/13/15 2138  TROPONINI <0.03    BNP: Invalid input(s): POCBNP  CBG:  Recent Labs Lab 11/15/15 1627 11/15/15 2120 11/16/15 0002 11/16/15 0403 11/16/15 0811  GLUCAP 109* 157* 138* 123* 120*    Microbiology: Results for orders placed or performed during the hospital encounter of 11/13/15  MRSA PCR Screening     Status: None   Collection Time: 11/14/15  3:11 AM  Result Value Ref Range Status  MRSA by PCR NEGATIVE NEGATIVE Final    Comment:        The GeneXpert MRSA Assay (FDA approved for NASAL specimens only), is one component of a comprehensive MRSA colonization surveillance program. It is not intended to diagnose MRSA infection nor to guide or monitor treatment for MRSA infections.     Coagulation Studies: No results for input(s): LABPROT, INR in the last 72 hours.  Urinalysis: No results for input(s): COLORURINE, LABSPEC, PHURINE, GLUCOSEU, HGBUR, BILIRUBINUR, KETONESUR, PROTEINUR, UROBILINOGEN, NITRITE, LEUKOCYTESUR in the last 72 hours.  Invalid input(s): APPERANCEUR    Imaging: No results found.   Medications:     . famotidine (PEPCID) IV  20 mg Intravenous Q24H  . heparin subcutaneous  5,000 Units Subcutaneous Q12H  . iopamidol  30 mL Oral Once  . piperacillin-tazobactam (ZOSYN)  IV  3.375 g Intravenous Q12H  . verapamil  80 mg Oral Daily   sodium chloride, heparin lock flush, heparin lock  flush, oxyCODONE, sodium chloride flush, sodium chloride flush  Assessment/ Plan:  50 y.o. female with past medical history of diabetes mellitus type 2, hypertension, multiple myeloma on chemotherapy, osteoarthritis, chronic kidney disease stage III who presents now with abdominal pain and acute renal failure.  1. Acute renal failure/CKD stage III baseline Cr 1.4 from 8/28/217. Etiology unclear but doubt progression of myeloma as cause of kidney disease as on 11/03/15 renal function was at baseline. 2. Hyperkalemia. 3. Abdominal pain with inflammation around the pancreas noted on CT scan of the abdomen and pelvis with normal lipase. 4. Metabolic acidosis with serum bicarbonate of 15. 5. Anemia likely related to chronic kidney disease and multiple myeloma.  Plan: BUN and creatinine have come down under the influence of dialysis. However the patient does not appear to be making much urine at this point in time.  CT scan of the abdomen and pelvis was negative for obstruction. On 11/03/2015 the patient was at her baseline creatinine. Continue to monitor renal parameters for now. We will consider performing another dialysis treatment tomorrow if urine output remains low.    LOS: 2 Jamie Keller 9/10/201711:35 AM

## 2015-11-17 ENCOUNTER — Inpatient Hospital Stay: Payer: Self-pay

## 2015-11-17 ENCOUNTER — Inpatient Hospital Stay
Admit: 2015-11-17 | Discharge: 2015-11-17 | Disposition: A | Discharge status: Home / Self Care | Payer: Medicaid Other | Attending: Nephrology | Admitting: Nephrology

## 2015-11-17 ENCOUNTER — Inpatient Hospital Stay: Payer: Self-pay | Admitting: Oncology

## 2015-11-17 ENCOUNTER — Inpatient Hospital Stay: Admit: 2015-11-17 | Payer: Medicaid Other

## 2015-11-17 LAB — CBC WITH DIFFERENTIAL/PLATELET
BASOS PCT: 1 %
Basophils Absolute: 0.1 10*3/uL (ref 0–0.1)
EOS PCT: 1 %
Eosinophils Absolute: 0.1 10*3/uL (ref 0–0.7)
HEMATOCRIT: 24.4 % — AB (ref 35.0–47.0)
HEMOGLOBIN: 8.3 g/dL — AB (ref 12.0–16.0)
Lymphocytes Relative: 12 %
Lymphs Abs: 0.6 10*3/uL — ABNORMAL LOW (ref 1.0–3.6)
MCH: 30.1 pg (ref 26.0–34.0)
MCHC: 34 g/dL (ref 32.0–36.0)
MCV: 88.5 fL (ref 80.0–100.0)
MONO ABS: 0.8 10*3/uL (ref 0.2–0.9)
MONOS PCT: 15 %
NEUTROS PCT: 71 %
Neutro Abs: 3.5 10*3/uL (ref 1.4–6.5)
PLATELETS: 114 10*3/uL — AB (ref 150–440)
RBC: 2.76 MIL/uL — AB (ref 3.80–5.20)
RDW: 18 % — ABNORMAL HIGH (ref 11.5–14.5)
WBC: 5.1 10*3/uL (ref 3.6–11.0)

## 2015-11-17 LAB — ECHOCARDIOGRAM COMPLETE
HEIGHTINCHES: 65 in
WEIGHTICAEL: 2163.2 [oz_av]

## 2015-11-17 LAB — BASIC METABOLIC PANEL
Anion gap: 9 (ref 5–15)
BUN: 43 mg/dL — ABNORMAL HIGH (ref 6–20)
CHLORIDE: 97 mmol/L — AB (ref 101–111)
CO2: 35 mmol/L — AB (ref 22–32)
CREATININE: 4.99 mg/dL — AB (ref 0.44–1.00)
Calcium: 7.6 mg/dL — ABNORMAL LOW (ref 8.9–10.3)
GFR calc non Af Amer: 9 mL/min — ABNORMAL LOW (ref >60)
GFR, EST AFRICAN AMERICAN: 11 mL/min — AB (ref >60)
Glucose, Bld: 106 mg/dL — ABNORMAL HIGH (ref 65–99)
POTASSIUM: 4 mmol/L (ref 3.5–5.1)
Sodium: 141 mmol/L (ref 135–145)

## 2015-11-17 LAB — GLUCOSE, CAPILLARY
GLUCOSE-CAPILLARY: 133 mg/dL — AB (ref 65–99)
GLUCOSE-CAPILLARY: 121 mg/dL — AB (ref 65–99)
GLUCOSE-CAPILLARY: 158 mg/dL — AB (ref 65–99)
Glucose-Capillary: 108 mg/dL — ABNORMAL HIGH (ref 65–99)
Glucose-Capillary: 109 mg/dL — ABNORMAL HIGH (ref 65–99)
Glucose-Capillary: 180 mg/dL — ABNORMAL HIGH (ref 65–99)

## 2015-11-17 NOTE — Progress Notes (Signed)
[facility]  ROUNDING NOTE   Subjective:  History taken with interpreter. Complains of peripheral edema, shortness of breath and dysguesia.   Last dialysis was 9/9 UOP 875  Objective:  Vital signs in last 24 hours:  Temp:  [98 F (36.7 C)-98.8 F (37.1 C)] 98 F (36.7 C) (09/11 0424) Pulse Rate:  [92-104] 92 (09/11 0424) Resp:  [17-18] 17 (09/11 0424) BP: (95-130)/(40-63) 119/63 (09/11 0424) SpO2:  [90 %-96 %] 96 % (09/11 0424) Weight:  [61.3 kg (135 lb 3.2 oz)] 61.3 kg (135 lb 3.2 oz) (09/11 0500)  Weight change: 1.126 kg (2 lb 7.7 oz) Filed Weights   11/15/15 1100 11/16/15 0428 11/17/15 0500  Weight: 60.2 kg (132 lb 11.5 oz) 62.2 kg (137 lb 3.2 oz) 61.3 kg (135 lb 3.2 oz)    Intake/Output: I/O last 3 completed shifts: In: 2802.3 [P.O.:300; I.V.:2349.1; IV Piggyback:153.2] Out: 1175 [Urine:1175]   Intake/Output this shift:  Total I/O In: 240 [P.O.:240] Out: -   Physical Exam: General: No acute distress, resting in bed  Head: Normocephalic, atraumatic. Moist oral mucosal membranes  Eyes: Anicteric  Neck: Supple, trachea midline  Lungs:  Clear to auscultation, normal effort  Heart: S1S2 no rubs  Abdomen:  Soft, mid upper abdominal tenderness noted  Extremities: Trace  peripheral edema.  Neurologic: Nonfocal, moving all four extremities  Skin: No lesions  Access: Right femoral HD temp catheter 9/8, Dr. Isidore Moos    Basic Metabolic Panel:  Recent Labs Lab 11/14/15 0000 11/14/15 0326 11/14/15 1335 11/15/15 0532 11/15/15 1100 11/16/15 0441 11/17/15 0430  NA 137 135  --  139  --  142 141  K 5.6* 5.9*  --  3.6  --  3.5 4.0  CL 113* 110  --  98*  --  97* 97*  CO2 9* 15*  --  30  --  34* 35*  GLUCOSE 240* 201*  --  105*  --  127* 106*  BUN 82* 85*  --  52*  --  36* 43*  CREATININE 5.67* 5.68*  --  4.64*  --  3.95* 4.99*  CALCIUM 9.9 9.1  --  7.5*  --  7.3* 7.6*  MG  --  2.0  --  1.5*  --   --   --   PHOS  --  6.7* 4.9*  --  4.5  --   --      Liver Function Tests:  Recent Labs Lab 11/13/15 2138  AST 249*  ALT 272*  ALKPHOS 142*  BILITOT 1.4*  PROT 7.0  ALBUMIN 3.3*    Recent Labs Lab 11/14/15 0326  LIPASE 44   No results for input(s): AMMONIA in the last 168 hours.  CBC:  Recent Labs Lab 11/13/15 2138 11/14/15 0326 11/15/15 0532 11/17/15 0430  WBC 8.8 7.2 4.8 5.1  NEUTROABS 5.7  --   --  3.5  HGB 8.7* 9.2* 8.4* 8.3*  HCT 26.8* 26.3* 23.7* 24.4*  MCV 94.6 89.2 86.6 88.5  PLT 184 123* 121* 114*    Cardiac Enzymes:  Recent Labs Lab 11/13/15 2138  TROPONINI <0.03    BNP: Invalid input(s): POCBNP  CBG:  Recent Labs Lab 11/16/15 1650 11/16/15 2001 11/16/15 2341 11/17/15 0427 11/17/15 0747  GLUCAP 187* 192* 121* 108* 109*    Microbiology: Results for orders placed or performed during the hospital encounter of 11/13/15  MRSA PCR Screening     Status: None   Collection Time: 11/14/15  3:11 AM  Result Value Ref Range  Status   MRSA by PCR NEGATIVE NEGATIVE Final    Comment:        The GeneXpert MRSA Assay (FDA approved for NASAL specimens only), is one component of a comprehensive MRSA colonization surveillance program. It is not intended to diagnose MRSA infection nor to guide or monitor treatment for MRSA infections.     Coagulation Studies: No results for input(s): LABPROT, INR in the last 72 hours.  Urinalysis: No results for input(s): COLORURINE, LABSPEC, PHURINE, GLUCOSEU, HGBUR, BILIRUBINUR, KETONESUR, PROTEINUR, UROBILINOGEN, NITRITE, LEUKOCYTESUR in the last 72 hours.  Invalid input(s): APPERANCEUR    Imaging: No results found.   Medications:     . famotidine  20 mg Oral Daily  . heparin subcutaneous  5,000 Units Subcutaneous Q12H  . iopamidol  30 mL Oral Once  . piperacillin-tazobactam (ZOSYN)  IV  3.375 g Intravenous Q12H  . verapamil  80 mg Oral Daily   sodium chloride, heparin lock flush, heparin lock flush, oxyCODONE, sodium chloride flush, sodium  chloride flush  Assessment/ Plan:  50 y.o.hispanic female with diabetes mellitus type 2, hypertension, multiple myeloma on chemotherapy, osteoarthritis, chronic kidney disease stage III   1. Acute renal failure on CKD stage III baseline Cr 1.4 from 8/28/217. Dialysis on 9/9. Potassium at goal. Metabolic acidosis improved. Concern for progression of myeloma kidney.  - Discussed outpatient dialysis.  - evaluate for dialysis daily.   2. 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 With thrombocytopenia. PRBC transfusion on 9/7 - discuss case with Oncology.   3. Multiple Myeloma: on velcade and zometa. Held steroids due to hyperglycemia.   4. Hypertension: at goal.  - verapamil   LOS: Shelby, Odem 9/11/201711:33 AM

## 2015-11-17 NOTE — Progress Notes (Addendum)
[facility] NAME: Jamie Keller    MR#:  [id number]  DATE OF BIRTH:  Dec 01, 1965  SUBJECTIVE:  CHIEF COMPLAINT:   Chief Complaint  Patient presents with  . Abdominal Pain  . Respiratory Distress   Mild shortness of breath along with edema. Urine output 875 ML in last 24 hours.  Patient seen and case discussed with him to put her on the phone through language line. Spanish.  REVIEW OF SYSTEMS:    Review of Systems  Constitutional: Negative for chills and fever.  HENT: Negative for sore throat.   Eyes: Negative for blurred vision, double vision and pain.  Respiratory: Negative for cough, hemoptysis, shortness of breath and wheezing.   Cardiovascular: Negative for chest pain, palpitations, orthopnea and leg swelling.  Gastrointestinal: Positive for abdominal pain. Negative for constipation, diarrhea, heartburn, nausea and vomiting.  Genitourinary: Negative for dysuria and hematuria.  Musculoskeletal: Negative for back pain and joint pain.  Skin: Negative for rash.  Neurological: Negative for sensory change, speech change, focal weakness and headaches.  Endo/Heme/Allergies: Does not bruise/bleed easily.  Psychiatric/Behavioral: Negative for depression. The patient is not nervous/anxious.     DRUG ALLERGIES:   Allergies  Allergen Reactions  . No Known Allergies     VITALS:  Blood pressure 122/64, pulse 91, temperature 98.2 F (36.8 C), temperature source Oral, resp. rate 18, height 5' 5"  (1.651 m), weight 61.3 kg (135 lb 3.2 oz), SpO2 96 %.  PHYSICAL EXAMINATION:   Physical Exam  GENERAL:  50 y.o.-year-old patient lying in the bed with no acute distress.  EYES: Pupils equal, round, reactive to light and accommodation. No scleral icterus. Extraocular muscles intact.  HEENT: Head atraumatic, normocephalic. Oropharynx and nasopharynx clear.  NECK:  Supple, no jugular venous distention. No thyroid  enlargement, no tenderness.  LUNGS: Normal breath sounds bilaterally, no wheezing, rales, rhonchi. No use of accessory muscles of respiration.  CARDIOVASCULAR: S1, S2 normal. No murmurs, rubs, or gallops.  ABDOMEN: Soft, nondistended. Bowel sounds present. No organomegaly or mass. Mild epigastric area tenderness EXTREMITIES: No cyanosis, clubbing or edema b/l.    NEUROLOGIC: Cranial nerves II through XII are intact. No focal Motor or sensory deficits b/l.   PSYCHIATRIC: The patient is alert and oriented x 3.  SKIN: No obvious rash, lesion, or ulcer.   LABORATORY PANEL:   CBC  Recent Labs Lab 11/17/15 0430  WBC 5.1  HGB 8.3*  HCT 24.4*  PLT 114*   ------------------------------------------------------------------------------------------------------------------ Chemistries   Recent Labs Lab 11/13/15 2138  11/15/15 0532  11/17/15 0430  NA 135  < > 139  < > 141  K 6.2*  < > 3.6  < > 4.0  CL 109  < > 98*  < > 97*  CO2 11*  < > 30  < > 35*  GLUCOSE 200*  < > 105*  < > 106*  BUN 89*  < > 52*  < > 43*  CREATININE 6.29*  < > 4.64*  < > 4.99*  CALCIUM 9.2  < > 7.5*  < > 7.6*  MG  --   < > 1.5*  --   --   AST 249*  --   --   --   --   ALT 272*  --   --   --   --   ALKPHOS 142*  --   --   --   --   BILITOT 1.4*  --   --   --   --   < > =  values in this interval not displayed. ------------------------------------------------------------------------------------------------------------------  Cardiac Enzymes  Recent Labs Lab 11/13/15 2138  TROPONINI <0.03   ------------------------------------------------------------------------------------------------------------------  RADIOLOGY:  No results found.   ASSESSMENT AND PLAN:   * Acute kidney injury over CKD3 Patient has been started on dialysis. Poor urine output. Oliguric. Monitor urine output. BUN and creatinine. No uremic symptoms. Discussed with Dr. Juleen China nephrology and no dialysis today. Monitor urine output and  BMP tomorrow.  * Mild acute pancreatitis Improving Tolerating food  * Anemia of chronic disease is stable  * Multiple myeloma Discussed with Dr. Grayland Ormond. Hold treatment at this point due to renal failure. Patient was on Velcade.  All the records are reviewed and case discussed with Care Management/Social Workerr. Management plans discussed with the patient, family and they are in agreement.  CODE STATUS: FULL CODE  DVT Prophylaxis: SCDs  TOTAL TIME TAKING CARE OF THIS PATIENT: 35 minutes.   POSSIBLE D/C IN 2-3 DAYS, DEPENDING ON CLINICAL CONDITION.  Hillary Bow R M.D on 11/17/2015 at 1:07 PM  Between 7am to 6pm - Pager - [phone]  After 6pm go to [email or web address] - password [email or web address] [facility]  Office  [phone]  CC: Primary care physician; [name], MD  Note: This dictation was prepared with Dragon dictation along with smaller phrase technology. Any transcriptional errors that result from this process are unintentional.

## 2015-11-17 NOTE — Care Management Note (Signed)
Case Management Note  Patient Details  Name: Jamie Keller MRN: [id number] Date of Birth: 1966/01/27  Subjective/Objective:     Accompanied by a Spanish Language interpreter: 50yo Mrs Jamie Keller was admitted with acute renal failure and is currently receiving temporary hemodialysis ar [place]. Hx Multiple myeloma and is followed at the [facility]. This Probation officer provided Mrs Jamie Keller with the name, address, phone number to [place] and strongly encouraged her to apply for Medicaid as soon as possible. Mrs Jamie Keller resides with her husband who assists with transportation needs. She takes the bus to her [facility] appointments. PCP=Adrian Macheno. Pharmacy=[facility]. Mrs Jamie Keller denies having any home assistive equipment, home oxygen, or home health services. Case management will follow for discharge planning.  .              Message left on the phone of Jamie Keller Patient Financial Resource Specialist requesting that she advise/assist Mrs Jamie Keller with applying for Medicaid if possible.   Action/Plan:        Expected Discharge Date:  11/16/15               Expected Discharge Plan:     In-House Referral:     Discharge planning Services     Post Acute Care Choice:    Choice offered to:     DME Arranged:    DME Agency:     HH Arranged:    HH Agency:     Status of Service:     If discussed at [employer], dates discussed:    Additional Comments:  Tyrhonda Georgiades A, RN 11/17/2015, 11:20 AM

## 2015-11-18 LAB — BASIC METABOLIC PANEL WITH GFR
Anion gap: 12 (ref 5–15)
BUN: 45 mg/dL — ABNORMAL HIGH (ref 6–20)
CO2: 29 mmol/L (ref 22–32)
Calcium: 7.9 mg/dL — ABNORMAL LOW (ref 8.9–10.3)
Chloride: 97 mmol/L — ABNORMAL LOW (ref 101–111)
Creatinine, Ser: 5.82 mg/dL — ABNORMAL HIGH (ref 0.44–1.00)
GFR calc Af Amer: 9 mL/min — ABNORMAL LOW
GFR calc non Af Amer: 8 mL/min — ABNORMAL LOW
Glucose, Bld: 124 mg/dL — ABNORMAL HIGH (ref 65–99)
Potassium: 4.2 mmol/L (ref 3.5–5.1)
Sodium: 138 mmol/L (ref 135–145)

## 2015-11-18 LAB — GLUCOSE, CAPILLARY
GLUCOSE-CAPILLARY: 131 mg/dL — AB (ref 65–99)
GLUCOSE-CAPILLARY: 112 mg/dL — AB (ref 65–99)
GLUCOSE-CAPILLARY: 180 mg/dL — AB (ref 65–99)
GLUCOSE-CAPILLARY: 206 mg/dL — AB (ref 65–99)
Glucose-Capillary: 173 mg/dL — ABNORMAL HIGH (ref 65–99)

## 2015-11-18 MED ORDER — GABAPENTIN 100 MG PO CAPS
100.0000 mg | ORAL_CAPSULE | Freq: Three times a day (TID) | ORAL | Status: DC
  Administered 2015-11-18 – 2015-11-23 (×14): 100 mg via ORAL
  Filled 2015-11-18 (×14): qty 1

## 2015-11-18 MED ORDER — OXYCODONE HCL 5 MG PO TABS
10.0000 mg | ORAL_TABLET | ORAL | Status: DC | PRN
  Administered 2015-11-20 – 2015-11-25 (×2): 10 mg via ORAL
  Filled 2015-11-18 (×2): qty 2

## 2015-11-18 MED ORDER — ACETAMINOPHEN 325 MG PO TABS
650.0000 mg | ORAL_TABLET | Freq: Four times a day (QID) | ORAL | Status: DC | PRN
  Administered 2015-11-19 – 2015-11-23 (×2): 650 mg via ORAL
  Filled 2015-11-18 (×2): qty 2

## 2015-11-18 NOTE — Progress Notes (Signed)
[facility]  ROUNDING NOTE   Subjective:   History taken with help of interpreter.  UOP 1725  Creatinine 5.82 (4.99)  Continues to complain uremic symptoms   Objective:  Vital signs in last 24 hours:  Temp:  [98.2 F (36.8 C)-98.4 F (36.9 C)] 98.4 F (36.9 C) (09/12 0438) Pulse Rate:  [91-102] 102 (09/12 0438) Resp:  [17-18] 17 (09/12 0438) BP: (117-136)/(50-65) 136/65 (09/12 0438) SpO2:  [95 %-98 %] 98 % (09/12 0438) Weight:  [62.1 kg (136 lb 12.8 oz)] 62.1 kg (136 lb 12.8 oz) (09/12 0500)  Weight change: 0.726 kg (1 lb 9.6 oz) Filed Weights   11/16/15 0428 11/17/15 0500 11/18/15 0500  Weight: 62.2 kg (137 lb 3.2 oz) 61.3 kg (135 lb 3.2 oz) 62.1 kg (136 lb 12.8 oz)    Intake/Output: I/O last 3 completed shifts: In: 1060 [P.O.:960; IV Piggyback:100] Out: 1950 [Urine:1950]   Intake/Output this shift:  Total I/O In: -  Out: 175 [Urine:175]  Physical Exam: General: No acute distress, resting in bed  Head: Normocephalic, atraumatic. Moist oral mucosal membranes  Eyes: Anicteric  Neck: Supple, trachea midline  Lungs:  Clear to auscultation, normal effort  Heart: S1S2 no rubs  Abdomen:  Soft, epigastric abdominal tenderness   Extremities: Trace  peripheral edema.  Neurologic: Nonfocal, moving all four extremities  Skin: No lesions  Access: Right femoral HD temp catheter 9/8, Dr. Isidore Moos    Basic Metabolic Panel:  Recent Labs Lab 11/14/15 0326 11/14/15 1335 11/15/15 0532 11/15/15 1100 11/16/15 0441 11/17/15 0430 11/18/15 0515  NA 135  --  139  --  142 141 138  K 5.9*  --  3.6  --  3.5 4.0 4.2  CL 110  --  98*  --  97* 97* 97*  CO2 15*  --  30  --  34* 35* 29  GLUCOSE 201*  --  105*  --  127* 106* 124*  BUN 85*  --  52*  --  36* 43* 45*  CREATININE 5.68*  --  4.64*  --  3.95* 4.99* 5.82*  CALCIUM 9.1  --  7.5*  --  7.3* 7.6* 7.9*  MG 2.0  --  1.5*  --   --   --   --   PHOS 6.7* 4.9*  --  4.5  --   --   --     Liver Function  Tests:  Recent Labs Lab 11/13/15 2138  AST 249*  ALT 272*  ALKPHOS 142*  BILITOT 1.4*  PROT 7.0  ALBUMIN 3.3*    Recent Labs Lab 11/14/15 0326  LIPASE 44   No results for input(s): AMMONIA in the last 168 hours.  CBC:  Recent Labs Lab 11/13/15 2138 11/14/15 0326 11/15/15 0532 11/17/15 0430  WBC 8.8 7.2 4.8 5.1  NEUTROABS 5.7  --   --  3.5  HGB 8.7* 9.2* 8.4* 8.3*  HCT 26.8* 26.3* 23.7* 24.4*  MCV 94.6 89.2 86.6 88.5  PLT 184 123* 121* 114*    Cardiac Enzymes:  Recent Labs Lab 11/13/15 2138  TROPONINI <0.03    BNP: Invalid input(s): POCBNP  CBG:  Recent Labs Lab 11/17/15 1639 11/17/15 2021 11/17/15 2351 11/18/15 0438 11/18/15 0743  GLUCAP 121* 158* 180* 131* 112*    Microbiology: Results for orders placed or performed during the hospital encounter of 11/13/15  MRSA PCR Screening     Status: None   Collection Time: 11/14/15  3:11 AM  Result Value Ref Range Status  MRSA by PCR NEGATIVE NEGATIVE Final    Comment:        The GeneXpert MRSA Assay (FDA approved for NASAL specimens only), is one component of a comprehensive MRSA colonization surveillance program. It is not intended to diagnose MRSA infection nor to guide or monitor treatment for MRSA infections.     Coagulation Studies: No results for input(s): LABPROT, INR in the last 72 hours.  Urinalysis: No results for input(s): COLORURINE, LABSPEC, PHURINE, GLUCOSEU, HGBUR, BILIRUBINUR, KETONESUR, PROTEINUR, UROBILINOGEN, NITRITE, LEUKOCYTESUR in the last 72 hours.  Invalid input(s): APPERANCEUR    Imaging: No results found.   Medications:     . famotidine  20 mg Oral Daily  . heparin subcutaneous  5,000 Units Subcutaneous Q12H  . iopamidol  30 mL Oral Once  . piperacillin-tazobactam (ZOSYN)  IV  3.375 g Intravenous Q12H  . verapamil  80 mg Oral Daily   sodium chloride, heparin lock flush, heparin lock flush, oxyCODONE, sodium chloride flush, sodium chloride  flush  Assessment/ Plan:  50 y.o.hispanic female with diabetes mellitus type 2, hypertension, multiple myeloma on chemotherapy, osteoarthritis, chronic kidney disease stage III   1. Acute renal failure on CKD stage III baseline Cr 1.4 from 8/28/217. Dialysis on 9/8 and 9/9. Potassium at goal. Metabolic acidosis improved. Concern for progression of myeloma kidney.  - Discussed outpatient dialysis.  - evaluate for dialysis daily.   2. 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 With thrombocytopenia. PRBC transfusion on 9/7  3. Multiple Myeloma: on velcade and zometa. Held steroids due to hyperglycemia.   4. Hypertension: at goal.  - verapamil   LOS: Pierce, Kenmar 9/12/201710:03 AM

## 2015-11-18 NOTE — Progress Notes (Signed)
   11/15/15 1330  Post-Hemodialysis Assessment  Rinseback Volume (mL) 250 mL  Dialyzer Clearance Lightly streaked  Duration of HD Treatment -hour(s) 2.5 hour(s)  Hemodialysis Intake (mL) 500 mL  UF Total -Machine (mL) 500 mL  Net UF (mL) 0 mL  Tolerated HD Treatment Yes

## 2015-11-18 NOTE — Plan of Care (Signed)
Problem: Activity: Goal: Risk for activity intolerance will decrease Outcome: Not Progressing Patient on strict bedrest.

## 2015-11-18 NOTE — Progress Notes (Signed)
[place] [facility] [place] NAME: Jamie Keller    MR#:  [id number]  DATE OF BIRTH:  04-01-1965  SUBJECTIVE:  CHIEF COMPLAINT:   Chief Complaint  Patient presents with  . Abdominal Pain  . Respiratory Distress   Increased urine output over last 24 hours.  Planes of some dizziness, fatigue, abdominal pain.  REVIEW OF SYSTEMS:    Review of Systems  Constitutional: Negative for chills and fever.  HENT: Negative for sore throat.   Eyes: Negative for blurred vision, double vision and pain.  Respiratory: Negative for cough, hemoptysis, shortness of breath and wheezing.   Cardiovascular: Negative for chest pain, palpitations, orthopnea and leg swelling.  Gastrointestinal: Positive for abdominal pain. Negative for constipation, diarrhea, heartburn, nausea and vomiting.  Genitourinary: Negative for dysuria and hematuria.  Musculoskeletal: Negative for back pain and joint pain.  Skin: Negative for rash.  Neurological: Positive for dizziness. Negative for sensory change, speech change, focal weakness and headaches.  Endo/Heme/Allergies: Does not bruise/bleed easily.  Psychiatric/Behavioral: Negative for depression. The patient is not nervous/anxious.     DRUG ALLERGIES:   Allergies  Allergen Reactions  . No Known Allergies     VITALS:  Blood pressure 136/65, pulse (!) 102, temperature 98.4 F (36.9 C), temperature source Oral, resp. rate 17, height _0  (1.651 m), weight 62.1 kg (136 lb 12.8 oz), SpO2 98 %.  PHYSICAL EXAMINATION:   Physical Exam  GENERAL:  50 y.o.-year-old patient lying in the bed with no acute distress.  EYES: Pupils equal, round, reactive to light and accommodation. No scleral icterus. Extraocular muscles intact.  HEENT: Head atraumatic, normocephalic. Oropharynx and nasopharynx clear.  NECK:  Supple, no jugular venous distention. No thyroid enlargement, no tenderness.  LUNGS: Normal breath sounds  bilaterally, no wheezing, rales, rhonchi. No use of accessory muscles of respiration.  CARDIOVASCULAR: S1, S2 normal. No murmurs, rubs, or gallops.  ABDOMEN: Soft, nondistended. Bowel sounds present. No organomegaly or mass. Mild epigastric area tenderness EXTREMITIES: No cyanosis, clubbing or edema b/l.    NEUROLOGIC: Cranial nerves II through XII are intact. No focal Motor or sensory deficits b/l.   PSYCHIATRIC: The patient is alert and oriented x 3.  SKIN: No obvious rash, lesion, or ulcer.   LABORATORY PANEL:   CBC  Recent Labs Lab 11/17/15 0430  WBC 5.1  HGB 8.3*  HCT 24.4*  PLT 114*   ------------------------------------------------------------------------------------------------------------------ Chemistries   Recent Labs Lab 11/13/15 2138  11/15/15 0532  11/18/15 0515  NA 135  < > 139  < > 138  K 6.2*  < > 3.6  < > 4.2  CL 109  < > 98*  < > 97*  CO2 11*  < > 30  < > 29  GLUCOSE 200*  < > 105*  < > 124*  BUN 89*  < > 52*  < > 45*  CREATININE 6.29*  < > 4.64*  < > 5.82*  CALCIUM 9.2  < > 7.5*  < > 7.9*  MG  --   < > 1.5*  --   --   AST 249*  --   --   --   --   ALT 272*  --   --   --   --   ALKPHOS 142*  --   --   --   --   BILITOT 1.4*  --   --   --   --   < > = values  in this interval not displayed. ------------------------------------------------------------------------------------------------------------------  Cardiac Enzymes  Recent Labs Lab 11/13/15 2138  TROPONINI <0.03   ------------------------------------------------------------------------------------------------------------------  RADIOLOGY:  No results found.   ASSESSMENT AND PLAN:   * Acute kidney injury over CKD3 Patient has had 2 consecutive days of dialysis and stopped Poor urine output. Urine output improving. Monitor urine output. BUN and creatinine. Discussed with Dr. Juleen China nephrology and no dialysis today. Monitor urine output and BMP tomorrow.  * Mild acute  pancreatitis Improving. Pain medications as needed Tolerating food  * Anemia of chronic disease is stable  * Multiple myeloma Discussed with Dr. Grayland Ormond. Hold treatment at this point due to renal failure. Patient was on Velcade.  All the records are reviewed and case discussed with Care Management/Social Workerr. Management plans discussed with the patient, family and they are in agreement.  CODE STATUS: FULL CODE  DVT Prophylaxis: SCDs  TOTAL TIME TAKING CARE OF THIS PATIENT: 30 minutes.   POSSIBLE D/C IN 2-3 DAYS, DEPENDING ON CLINICAL CONDITION.  Hillary Bow R M.D on 11/18/2015 at 12:29 PM  Between 7am to 6pm - Pager - [phone]  After 6pm go to [email or web address] - password [email or web address] [facility]  Office  [phone]  CC: Primary care physician; [name], MD  Note: This dictation was prepared with Dragon dictation along with smaller phrase technology. Any transcriptional errors that result from this process are unintentional.

## 2015-11-18 NOTE — Care Management (Signed)
New HD information faxed to [facility] Patient Relations Liaison

## 2015-11-18 NOTE — Progress Notes (Signed)
   11/14/15 1530  Post-Hemodialysis Assessment  Rinseback Volume (mL) 250 mL  Dialyzer Clearance Lightly streaked  Duration of HD Treatment -hour(s) 2 hour(s)  Hemodialysis Intake (mL) 500 mL  UF Total -Machine (mL) 500 mL  Net UF (mL) 0 mL  Tolerated HD Treatment Yes

## 2015-11-18 NOTE — Progress Notes (Signed)
Nutrition Follow-up  DOCUMENTATION CODES:   Not applicable  INTERVENTION:  -Encouraged good sources of protein along with well balanced diet.  Interpreter present during education.  Pt verbalized understanding and expect good compliance. -If unable to meet nutritional needs recommend adding oral nutrition supplement   NUTRITION DIAGNOSIS:   Inadequate oral intake related to altered GI function as evidenced by NPO status.   GOAL:   Patient will meet greater than or equal to 90% of their needs   MONITOR:   Diet advancement  REASON FOR ASSESSMENT:   Malnutrition Screening Tool    ASSESSMENT:    Pt has completed 2 days of hemodialysis, noted not planning HD tomorrow per MD note.  Mild acute pancreatitis improving.  Spoke with pt via interpreter. Pt reports poor po intake for the past 1 week prior to admission, only eating 1-2 meals per day secondary to nausea, vomiting off and on.  Pt eating some better in the hospital at tuna salad sandwich today for lunch, eggs, milk and fruit for breakfast this am.  Per I and O sheet intake 40-100% of meals.    UOP: 1761ml  Medications and Labs reviewed: BUN 45, creatinine 5.82, glucose 124   Diet Order:  Diet heart healthy/carb modified Room service appropriate? Yes; Fluid consistency: Thin  Skin:  Reviewed, no issues  Last BM:  9/7, noted distended abdomen  Height:   Ht Readings from Last 1 Encounters:  11/15/15 5\' 5"  (1.651 m)    Weight: Pt unsure if lost any wt.   Wt Readings from Last 1 Encounters:  11/18/15 136 lb 12.8 oz (62.1 kg)    Ideal Body Weight:     BMI:  Body mass index is 22.76 kg/m.  Estimated Nutritional Needs:   Kcal:  1770-2000 kcals/d  Protein:  71-89 g/d  Fluid:  1061ml + UOP  EDUCATION NEEDS:   No education needs identified at this time  Draden Cottingham B. Zenia Resides, [place], [place] (pager) Weekend/On-Call pager [phone])

## 2015-11-19 ENCOUNTER — Encounter
Admission: EM | Disposition: A | Discharge status: Home / Self Care | Payer: Self-pay | Source: Home / Self Care | Attending: Internal Medicine

## 2015-11-19 HISTORY — PX: PERIPHERAL VASCULAR CATHETERIZATION: SHX172C

## 2015-11-19 LAB — GLUCOSE, CAPILLARY
GLUCOSE-CAPILLARY: 110 mg/dL — AB (ref 65–99)
GLUCOSE-CAPILLARY: 148 mg/dL — AB (ref 65–99)
Glucose-Capillary: 125 mg/dL — ABNORMAL HIGH (ref 65–99)
Glucose-Capillary: 116 mg/dL — ABNORMAL HIGH (ref 65–99)
Glucose-Capillary: 151 mg/dL — ABNORMAL HIGH (ref 65–99)

## 2015-11-19 LAB — BASIC METABOLIC PANEL
Anion gap: 11 (ref 5–15)
BUN: 55 mg/dL — AB (ref 6–20)
CALCIUM: 8.5 mg/dL — AB (ref 8.9–10.3)
CO2: 28 mmol/L (ref 22–32)
CREATININE: 6.54 mg/dL — AB (ref 0.44–1.00)
Chloride: 102 mmol/L (ref 101–111)
GFR, EST AFRICAN AMERICAN: 8 mL/min — AB (ref >60)
GFR, EST NON AFRICAN AMERICAN: 7 mL/min — AB (ref >60)
Glucose, Bld: 128 mg/dL — ABNORMAL HIGH (ref 65–99)
Potassium: 4.3 mmol/L (ref 3.5–5.1)
SODIUM: 141 mmol/L (ref 135–145)

## 2015-11-19 SURGERY — DIALYSIS/PERMA CATHETER INSERTION
Anesthesia: Moderate Sedation

## 2015-11-19 MED ORDER — INSULIN ASPART 100 UNIT/ML ~~LOC~~ SOLN
0.0000 [IU] | Freq: Three times a day (TID) | SUBCUTANEOUS | Status: DC
  Administered 2015-11-20: 2 [IU] via SUBCUTANEOUS
  Administered 2015-11-21 (×2): 1 [IU] via SUBCUTANEOUS
  Administered 2015-11-21 – 2015-11-22 (×3): 2 [IU] via SUBCUTANEOUS
  Administered 2015-11-22: 1 [IU] via SUBCUTANEOUS
  Administered 2015-11-23: 2 [IU] via SUBCUTANEOUS
  Administered 2015-11-23 – 2015-11-24 (×2): 1 [IU] via SUBCUTANEOUS
  Administered 2015-11-24: 2 [IU] via SUBCUTANEOUS
  Administered 2015-11-24: 1 [IU] via SUBCUTANEOUS
  Administered 2015-11-25: 3 [IU] via SUBCUTANEOUS
  Administered 2015-11-26: 1 [IU] via SUBCUTANEOUS
  Administered 2015-11-26 – 2015-11-27 (×3): 2 [IU] via SUBCUTANEOUS
  Administered 2015-11-27 – 2015-11-28 (×2): 1 [IU] via SUBCUTANEOUS
  Administered 2015-11-28: 2 [IU] via SUBCUTANEOUS
  Filled 2015-11-19: qty 3
  Filled 2015-11-19 (×2): qty 2
  Filled 2015-11-19 (×3): qty 1
  Filled 2015-11-19 (×3): qty 2
  Filled 2015-11-19: qty 1
  Filled 2015-11-19: qty 0.09
  Filled 2015-11-19 (×2): qty 2
  Filled 2015-11-19: qty 1
  Filled 2015-11-19: qty 3
  Filled 2015-11-19 (×2): qty 2
  Filled 2015-11-19 (×2): qty 1
  Filled 2015-11-19 (×2): qty 2

## 2015-11-19 MED ORDER — DEXTROSE 5 % IV SOLN
1.5000 g | Freq: Once | INTRAVENOUS | Status: AC
  Administered 2015-11-19: 1.5 g via INTRAVENOUS

## 2015-11-19 MED ORDER — HEPARIN SOD (PORK) LOCK FLUSH 10 UNIT/ML IV SOLN
INTRAVENOUS | Status: AC
  Filled 2015-11-19: qty 1

## 2015-11-19 MED ORDER — FENTANYL CITRATE (PF) 100 MCG/2ML IJ SOLN
INTRAMUSCULAR | Status: AC
  Filled 2015-11-19: qty 2

## 2015-11-19 MED ORDER — DEXTROSE 5 % IV SOLN
INTRAVENOUS | Status: AC
  Filled 2015-11-19: qty 1.5

## 2015-11-19 MED ORDER — INSULIN ASPART 100 UNIT/ML ~~LOC~~ SOLN
0.0000 [IU] | Freq: Every day | SUBCUTANEOUS | Status: DC
  Administered 2015-11-21 – 2015-11-27 (×3): 2 [IU] via SUBCUTANEOUS
  Filled 2015-11-19 (×3): qty 2
  Filled 2015-11-19: qty 0.05

## 2015-11-19 MED ORDER — HEPARIN (PORCINE) IN NACL 2-0.9 UNIT/ML-% IJ SOLN
INTRAMUSCULAR | Status: AC
  Filled 2015-11-19: qty 500

## 2015-11-19 MED ORDER — MIDAZOLAM HCL 2 MG/2ML IJ SOLN
INTRAMUSCULAR | Status: DC | PRN
  Administered 2015-11-19 (×2): 2 mg via INTRAVENOUS

## 2015-11-19 MED ORDER — FENTANYL CITRATE (PF) 100 MCG/2ML IJ SOLN
INTRAMUSCULAR | Status: DC | PRN
  Administered 2015-11-19 (×2): 50 ug via INTRAVENOUS

## 2015-11-19 MED ORDER — HEPARIN SODIUM (PORCINE) 10000 UNIT/ML IJ SOLN
INTRAMUSCULAR | Status: AC
  Filled 2015-11-19: qty 1

## 2015-11-19 MED ORDER — LIDOCAINE-EPINEPHRINE (PF) 1 %-1:200000 IJ SOLN
INTRAMUSCULAR | Status: AC
  Filled 2015-11-19: qty 30

## 2015-11-19 MED ORDER — MIDAZOLAM HCL 2 MG/2ML IJ SOLN
INTRAMUSCULAR | Status: AC
  Filled 2015-11-19: qty 2

## 2015-11-19 MED ORDER — SODIUM CHLORIDE 0.9 % IV SOLN
INTRAVENOUS | Status: DC
  Administered 2015-11-19: 15:00:00 via INTRAVENOUS

## 2015-11-19 SURGICAL SUPPLY — 2 items
CATH PALINDROME RT-P 15FX23CM (CATHETERS) ×3 IMPLANT
PACK ANGIOGRAPHY (CUSTOM PROCEDURE TRAY) ×3 IMPLANT

## 2015-11-19 NOTE — Progress Notes (Signed)
Pre Dialysis 

## 2015-11-19 NOTE — Progress Notes (Signed)
Post HD, pt transferred to [facility] post HD, floor nurse notified.

## 2015-11-19 NOTE — Consult Note (Signed)
[facility] Vascular Consult Note  MRN : [id number]  Jamie Keller is a 50 y.o. (February 24, 1966) female who presents with chief complaint of  Chief Complaint  Patient presents with  . Abdominal Pain  . Respiratory Distress  .  History of Present Illness: I am asked by Dr. Juleen China to see the patient for Permcath insertion.  She has known stage III chronic kidney disease and was admitted with abdominal pain and pancreatitis. Her renal function has worsened and she has received 2 hemodialysis treatments through a temporary catheter. It now appears that her renal failure will be long-standing and likely end-stage renal disease, and so she will need a PermCath for dialysis both now and once she is discharged from the hospital. She is feeling better and her abdominal pain has improved since admission. She has a right sided Port-A-Cath in place from multiple myeloma. She does feel better after the 2 dialysis treatments and does not have respiratory complaints today. The exam and history is taken with help from a translator  Current Facility-Administered Medications  Medication Dose Route Frequency Provider Last Rate Last Dose  . [MAR Hold] 0.9 %  sodium chloride infusion  250 mL Intravenous PRN Bincy S Varughese, NP      . [MAR Hold] acetaminophen (TYLENOL) tablet 650 mg  650 mg Oral Q6H PRN Hillary Bow, MD      . Doug Sou Hold] famotidine (PEPCID) tablet 20 mg  20 mg Oral Daily Hillary Bow, MD   20 mg at 11/18/15 1126  . [MAR Hold] gabapentin (NEURONTIN) capsule 100 mg  100 mg Oral TID Hillary Bow, MD   100 mg at 11/18/15 2315  . [MAR Hold] heparin injection 5,000 Units  5,000 Units Subcutaneous Q12H Lenis Noon, RPH   5,000 Units at 11/18/15 2315  . [MAR Hold] heparin lock flush 100 unit/mL  500 Units Intracatheter Daily PRN Lequita Asal, MD      . Doug Sou Hold] heparin lock flush 100 unit/mL  250 Units Intracatheter PRN Lequita Asal, MD      . Doug Sou Hold]  insulin aspart (novoLOG) injection 0-5 Units  0-5 Units Subcutaneous QHS Epifanio Lesches, MD      . Doug Sou Hold] insulin aspart (novoLOG) injection 0-9 Units  0-9 Units Subcutaneous TID WC Epifanio Lesches, MD      . Doug Sou Hold] iopamidol (ISOVUE-300) 61 % injection 30 mL  30 mL Oral Once Earleen Newport, MD      . Doug Sou Hold] oxyCODONE (Oxy IR/ROXICODONE) immediate release tablet 10 mg  10 mg Oral Q4H PRN Hillary Bow, MD      . Doug Sou Hold] sodium chloride flush (NS) 0.9 % injection 10 mL  10 mL Intracatheter PRN Lequita Asal, MD      . Doug Sou Hold] sodium chloride flush (NS) 0.9 % injection 3 mL  3 mL Intracatheter PRN Lequita Asal, MD      . Doug Sou Hold] verapamil (CALAN) tablet 80 mg  80 mg Oral Daily Holley Raring, NP   80 mg at 11/18/15 1127    Past Medical History:  Diagnosis Date  . Diabetes mellitus without complication ([place])   . Hypertension   . Multiple myeloma ([place])   . Osteoarthritis   . Post-menopausal 2014    Past Surgical History:  Procedure Laterality Date  . CESAREAN SECTION     x2    Social History Social History  Substance Use Topics  . Smoking status: Never Smoker  .  Smokeless tobacco: Never Used  . Alcohol use No  No IV drug use  Family History Family History  Problem Relation Age of Onset  . Hypercholesterolemia Mother   . Hypertension Father   No bleeding disorders or clotting disorders  Allergies  Allergen Reactions  . No Known Allergies      REVIEW OF SYSTEMS (Negative unless checked)  Constitutional: _0 Weight loss  _1 Fever  _2 Chills Cardiac: _3 Chest pain   _4 Chest pressure   _5 Palpitations   _6 Shortness of breath when laying flat   _7 Shortness of breath at rest   _8 Shortness of breath with exertion. Vascular:  _9 Pain in legs with walking   _10 Pain in legs at rest   _11 Pain in legs when laying flat   _12 Claudication   _13 Pain in feet when walking  _14 Pain in feet at rest  _15 Pain in feet when laying flat   _16 History of DVT    _17 Phlebitis   _18 Swelling in legs   _19 Varicose veins   _20 Non-healing ulcers Pulmonary:   _21 Uses home oxygen   _22 Productive cough   _23 Hemoptysis   _24 Wheeze  _25 COPD   _26 Asthma Neurologic:  _27 Dizziness  _28 Blackouts   _29 Seizures   _30 History of stroke   _31 History of TIA  _32 Aphasia   _33 Temporary blindness   _34 Dysphagia   _35 Weakness or numbness in arms   _36 Weakness or numbness in legs Musculoskeletal:  _37 Arthritis   _38 Joint swelling   _39 Joint pain   _40 Low back pain Hematologic:  _41 Easy bruising  _42 Easy bleeding   _43 Hypercoagulable state   _44 Anemic  _45 Hepatitis Gastrointestinal:  _46 Blood in stool   _47 Vomiting blood  _48 Gastroesophageal reflux/heartburn   _49 Difficulty swallowing. Genitourinary:  _50 Chronic kidney disease   _51 Difficult urination  _52 Frequent urination  _53 Burning with urination   _54 Blood in urine Skin:  _55 Rashes   _56 Ulcers   _57 Wounds Psychological:  _58 History of anxiety   _59  History of major depression.  Physical Examination  Vitals:   11/19/15 1130 11/19/15 1209 11/19/15 1217 11/19/15 1248  BP: 139/62 (!) 149/99 (!) 154/73 (!) 150/71  Pulse: 100 (!) 108 (!) 106 (!) 107  Resp: 16 20 (!) 23 (!) 23  Temp:   98.4 F (36.9 C) 98.6 F (37 C)  TempSrc:   Oral Oral  SpO2: 98% 98% 98% 98%  Weight:      Height:       Body mass index is 21.2 kg/m. Gen:  WD/WN, NAD Head: [place]/AT, No temporalis wasting. Prominent temp pulse not noted. Ear/Nose/Throat: Hearing grossly intact, nares w/o erythema or drainage, oropharynx w/o Erythema/Exudate Eyes: PERRLA, EOMI.  Neck: Supple, no nuchal rigidity.  No JVD.  Pulmonary:  Good air movement, no use of accessory muscles.  Cardiac: RRR, normal S1, S2 Vascular: Port located in the right subclavicular region Vessel Right Left  Radial Palpable Palpable                                   Gastrointestinal: soft, non-tender/non-distended. No guarding/reflex. No masses, surgical incisions, or scars. Musculoskeletal: M/S 5/5 throughout.  Extremities  without ischemic changes.  No deformity or atrophy. 1-2+ BLE edema. Neurologic: CN 2-12 intact. Pain and light touch intact in extremities.  Symmetrical.  Speech is fluent. Motor exam as listed above. Psychiatric: Judgment intact, Mood & affect appropriate for pt's clinical situation. Dermatologic: No rashes or ulcers noted.  No cellulitis or open wounds. Lymph : No Cervical, Axillary, or Inguinal lymphadenopathy.    CBC Lab Results  Component Value Date   WBC 5.1 11/17/2015   HGB 8.3 (L) 11/17/2015   HCT 24.4 (L) 11/17/2015   MCV 88.5 11/17/2015   PLT 114 (L) 11/17/2015    BMET    Component Value Date/Time   NA 141 11/19/2015 0449   NA 137 06/27/2014 1344   K 4.3 11/19/2015 0449   K 4.0 06/27/2014 1344   CL 102 11/19/2015 0449   CL 106 06/27/2014 1344   CO2 28 11/19/2015 0449   CO2 26 06/27/2014 1344   GLUCOSE 128 (H) 11/19/2015 0449   GLUCOSE 83 06/27/2014 1344   BUN 55 (H) 11/19/2015 0449   BUN 27 (H) 06/27/2014 1344   CREATININE 6.54 (H) 11/19/2015 0449   CREATININE 1.36 (H) 06/27/2014 1344   CALCIUM 8.5 (L) 11/19/2015 0449   CALCIUM 9.1 06/27/2014 1344   GFRNONAA 7 (L) 11/19/2015 0449   GFRNONAA 46 (L) 06/27/2014 1344   GFRAA 8 (L) 11/19/2015 0449   GFRAA 53 (L) 06/27/2014 1344   Estimated Creatinine Clearance: 9.2 mL/min (by C-G formula based on SCr of 6.54 mg/dL (H)).  COAG No results found for: INR, PROTIME  Radiology Ct Abdomen Pelvis Wo Contrast  Result Date: 11/14/2015 CLINICAL DATA:  Acute onset of generalized abdominal distention. Generalized weakness and near syncope. Shortness of breath. Current history of multiple myeloma. Chills and vomiting. Initial encounter. EXAM: CT ABDOMEN AND PELVIS WITHOUT CONTRAST TECHNIQUE: Multidetector CT imaging of the abdomen and pelvis was performed following the standard protocol without IV contrast. COMPARISON:  CT of the abdomen and pelvis performed 09/02/2015 FINDINGS: Lower chest: Small bilateral pleural effusions  are noted, right greater than left, with bibasilar airspace opacities, possibly reflecting atelectasis or pneumonia. Hepatobiliary: The liver is unremarkable in appearance. Trace ascites is seen tracking about the liver and spleen. The gallbladder is grossly unremarkable in appearance. The common bile duct remains normal in caliber. Pancreas: Vague soft tissue inflammation is suggested about the pancreas, with fluid tracking inferiorly along the duodenum and Gerota's fascia on the right, concerning for acute pancreatitis. Evaluation for devascularization is limited without contrast. No pseudocysts are seen. Spleen: The spleen is unremarkable in appearance. Adrenals/Urinary Tract: The adrenal glands are grossly unremarkable. Nonspecific perinephric stranding is noted bilaterally. There is no evidence of hydronephrosis. No renal or ureteral stones are identified. Stomach/Bowel: The stomach is grossly unremarkable in appearance. No significant small bowel abnormalities are seen. The appendix is normal in caliber, without evidence of appendicitis. The colon is decompressed and grossly unremarkable in appearance. Vascular/Lymphatic: The vasculature is not well assessed without contrast. Minimal vascular calcification is seen. No retroperitoneal lymphadenopathy is seen. No pelvic sidewall lymphadenopathy is identified. Reproductive: The bladder is mildly distended and grossly unremarkable. The uterus is grossly unremarkable in appearance. The ovaries are relatively symmetric. No suspicious adnexal masses are seen. Other: A small amount of fluid within the pelvis. Presacral stranding is noted. Musculoskeletal: There is diffuse sclerosis and numerous lytic lesions throughout the visualized osseous structures, with chronic loss of height noted at T11. The visualized musculature is grossly unremarkable in appearance. IMPRESSION: 1. Vague soft tissue inflammation about the pancreas, with fluid tracking inferiorly along the  duodenum and Gerota's fascia on the right, concerning for acute pancreatitis. Would correlate with pancreatic lab values. No pseudocyst seen. Evaluation for devascularization is limited without contrast. 2. Trace ascites noted within the abdomen and pelvis. 3. Small bilateral pleural effusions, right greater than left, with bibasilar airspace opacities possibly reflecting atelectasis or pneumonia. 4. Diffuse sclerosis and numerous  lytic lesions throughout the visualized osseous structures, with chronic loss of height at T11. This reflects the patient's known multiple myeloma. Electronically Signed   By: Garald Balding M.D.   On: 11/14/2015 02:32   Dg Chest 1 View  Result Date: 11/13/2015 CLINICAL DATA:  Pallor. Abdominal pain and shortness of breath. Multiple myeloma. EXAM: CHEST 1 VIEW COMPARISON:  09/22/2015 FINDINGS: Power injectable right IJ Port-A-Cath tip: SVC. Mild enlargement of the cardiopericardial silhouette with bibasilar airspace opacities obscuring the hemidiaphragms. Old bilateral lateral lower rib fractures. Thoracic spondylosis. IMPRESSION: 1. Obscuration of both hemidiaphragms is increased from 09/22/2015 and may reflect atelectasis or pneumonia. The underlying pleural effusions are not readily excluded either. 2. Mild enlargement of the cardiopericardial silhouette. Electronically Signed   By: Van Clines M.D.   On: 11/13/2015 23:07      Assessment/Plan 1. Renal failure. Had acute on chronic injury and now appears to be end-stage renal disease and will need outpatient dialysis. PermCath is indicated for this reason, and we will plan on placing this today. Risks and benefits were discussed. 2. Multiple myeloma. Likely the underlying cause of her renal failure. Already has a right jugular Port-A-Cath in place so we'll likely have to place the PermCath on the left. 3. Pancreatitis. Receiving treatment currently and this will continue. 4. Diabetes. Stable outpatient medications.  Likely an underlying cause of renal insufficiency 5. Hypertension. Likely an underlying cause of renal insufficiency.   DEW,JASON, MD  11/19/2015 1:44 PM

## 2015-11-19 NOTE — Progress Notes (Signed)
Dialysis started 

## 2015-11-19 NOTE — Progress Notes (Signed)
[facility] [place] NAME: Jamie Keller    MR#:  [id number]  DATE OF BIRTH:  11/21/1965  Sorrento denies any shortness of breath today. Chest pain. Kidney function is worsening so she is scheduled for hemodialysis today.spoke with the [facility],  CHIEF COMPLAINT:   Chief Complaint  Patient presents with  . Abdominal Pain  . Respiratory Distress   Increased urine output over last 24 hours.  Planes of some dizziness, fatigue, abdominal pain.  REVIEW OF SYSTEMS:    Review of Systems  Constitutional: Negative for chills and fever.  HENT: Negative for sore throat.   Eyes: Negative for blurred vision, double vision and pain.  Respiratory: Negative for cough, hemoptysis, shortness of breath and wheezing.   Cardiovascular: Negative for chest pain, palpitations, orthopnea and leg swelling.  Gastrointestinal: Negative for abdominal pain, constipation, diarrhea, heartburn, nausea and vomiting.  Genitourinary: Negative for dysuria and hematuria.  Musculoskeletal: Negative for back pain and joint pain.  Skin: Negative for rash.  Neurological: Negative for dizziness, sensory change, speech change, focal weakness and headaches.  Endo/Heme/Allergies: Does not bruise/bleed easily.  Psychiatric/Behavioral: Negative for depression. The patient is not nervous/anxious.     DRUG ALLERGIES:   Allergies  Allergen Reactions  . No Known Allergies     VITALS:  Blood pressure 139/69, pulse (!) 101, temperature 98.4 F (36.9 C), temperature source Oral, resp. rate 19, height 5' 5"  (1.651 m), weight 57.8 kg (127 lb 6.8 oz), SpO2 98 %.  PHYSICAL EXAMINATION:   Physical Exam  GENERAL:  50 y.o.-year-old patient lying in the bed with no acute distress.  EYES: Pupils equal, round, reactive to light and accommodation. No scleral icterus. Extraocular muscles intact.  HEENT: Head atraumatic, normocephalic. Oropharynx  and nasopharynx clear.  NECK:  Supple, no jugular venous distention. No thyroid enlargement, no tenderness.  LUNGS: Normal breath sounds bilaterally, no wheezing, rales, rhonchi. No use of accessory muscles of respiration.  CARDIOVASCULAR: S1, S2 normal. No murmurs, rubs, or gallops.  ABDOMEN: Soft, nondistended. Bowel sounds present. No organomegaly or mass. Mild epigastric area tenderness EXTREMITIES: No cyanosis, clubbing or edema b/l.    NEUROLOGIC: Cranial nerves II through XII are intact. No focal Motor or sensory deficits b/l.   PSYCHIATRIC: The patient is alert and oriented x 3.  SKIN: No obvious rash, lesion, or ulcer.   LABORATORY PANEL:   CBC  Recent Labs Lab 11/17/15 0430  WBC 5.1  HGB 8.3*  HCT 24.4*  PLT 114*   ------------------------------------------------------------------------------------------------------------------ Chemistries   Recent Labs Lab 11/13/15 2138  11/15/15 0532  11/19/15 0449  NA 135  < > 139  < > 141  K 6.2*  < > 3.6  < > 4.3  CL 109  < > 98*  < > 102  CO2 11*  < > 30  < > 28  GLUCOSE 200*  < > 105*  < > 128*  BUN 89*  < > 52*  < > 55*  CREATININE 6.29*  < > 4.64*  < > 6.54*  CALCIUM 9.2  < > 7.5*  < > 8.5*  MG  --   < > 1.5*  --   --   AST 249*  --   --   --   --   ALT 272*  --   --   --   --   ALKPHOS 142*  --   --   --   --  BILITOT 1.4*  --   --   --   --   < > = values in this interval not displayed. ------------------------------------------------------------------------------------------------------------------  Cardiac Enzymes  Recent Labs Lab 11/13/15 2138  TROPONINI <0.03   ------------------------------------------------------------------------------------------------------------------  RADIOLOGY:  No results found.   ASSESSMENT AND PLAN:   * Acute kidney injury over CKD3 Patient received 2 sessions  of dialysis,a scheduledfor hemodialysis today because of worsening kidney function.  * Mild acute  pancreatitis Improving. Pain medications as needed Tolerating food  * Anemia of chronic disease is stable  * Multiple myeloma Discussed with Dr. Grayland Ormond. Hold treatment at this point due to renal failure. Patient was on Velcade. Discussed with the help of translator, answered her questions. Patient can use the bedside commode, I discussed with issues nephrologist.  Appreciate presents a registered nurse in the room while rounding.  All the records are reviewed and case discussed with Care Management/Social Workerr. Management plans discussed with the patient, family and they are in agreement.  CODE STATUS: FULL CODE  DVT Prophylaxis: SCDs  TOTAL TIME TAKING CARE OF THIS PATIENT: 30 minutes.   POSSIBLE D/C IN 2-3 DAYS, DEPENDING ON CLINICAL CONDITION.  Epifanio Lesches M.D on 11/19/2015 at 11:14 AM  Between 7am to 6pm - Pager - [phone]  After 6pm go to [email or web address] - password [email or web address] [facility]  Office  [phone]  CC: Primary care physician; [name], MD  Note: This dictation was prepared with Dragon dictation along with smaller phrase technology. Any transcriptional errors that result from this process are unintentional.

## 2015-11-19 NOTE — Progress Notes (Signed)
Tx ended    

## 2015-11-19 NOTE — Op Note (Signed)
OPERATIVE NOTE    PRE-OPERATIVE DIAGNOSIS: 1. ESRD 2. Multiple myeloma  POST-OPERATIVE DIAGNOSIS: same as above  PROCEDURE: 1. Ultrasound guidance for vascular access to the left internal jugular vein 2. Fluoroscopic guidance for placement of catheter 3. Placement of a 23 cm tip to cuff tunneled hemodialysis catheter via the left internal jugular vein  SURGEON: Leotis Pain, MD  ANESTHESIA:  Local with Moderate conscious sedation for approximately 25 minutes using 4 mg of Versed and 100 mcg of Fentanyl  ESTIMATED BLOOD LOSS: 35 cc  FLUORO TIME: less than one minute  CONTRAST: none  FINDING(S): 1.  Patent left internal jugular vein  SPECIMEN(S):  None  INDICATIONS:   Jamie Keller is a 50 y.o. female who presents with progression of renal failure and ESRD.  The patient needs long term dialysis access for their ESRD, and a Permcath is necessary.  Risks and benefits are discussed and informed consent is obtained.    DESCRIPTION: After obtaining full informed written consent, the patient was brought back to the vascular suited. The patient's left neck and chest were sterilely prepped and draped in a sterile surgical field was created. Moderate conscious sedation was administered during a face to face encounter with the patient throughout the procedure with my supervision of the RN administering medicines and monitoring the patient's vital signs, pulse oximetry, telemetry and mental status throughout from the start of the procedure until the patient was taken to the recovery room.  The left internal jugular vein was visualized with ultrasound and found to be patent. It was then accessed under direct ultrasound guidance and a permanent image was recorded. A wire was placed. After skin nick and dilatation, the peel-away sheath was placed over the wire. I then turned my attention to an area under the clavicle. Approximately 1-2 fingerbreadths below the clavicle a small counterincision  was created and tunneled from the subclavicular incision to the access site. Using fluoroscopic guidance, a 23 centimeter tip to cuff tunneled hemodialysis catheter was selected, and tunneled from the subclavicular incision to the access site. It was then placed through the peel-away sheath and the peel-away sheath was removed. Using fluoroscopic guidance the catheter tips were parked in the right atrium. The appropriate distal connectors were placed. It withdrew blood well and flushed easily with heparinized saline and a concentrated heparin solution was then placed. It was secured to the chest wall with 2 Prolene sutures. The access incision was closed single 4-0 Monocryl. A 4-0 Monocryl pursestring suture was placed around the exit site. Sterile dressings were placed. The patient tolerated the procedure well and was taken to the recovery room in stable condition.  COMPLICATIONS: None  CONDITION: Stable  DEW,JASON  11/19/2015, 3:30 PM

## 2015-11-19 NOTE — Progress Notes (Signed)
[facility]  ROUNDING NOTE   Subjective:   History taken with assistance with Spanish interpreter.  Seen and examined on hemodialysis. Dialysis today due to increasing creatinine.  Tolerating dialysis well. States her uremic symptoms are improving: confusion, edema, nausea but still with abdominal pain and shortness of breath.     HEMODIALYSIS FLOWSHEET:  Blood Flow Rate (mL/min): 300 mL/min Arterial Pressure (mmHg): -90 mmHg Venous Pressure (mmHg): 130 mmHg Transmembrane Pressure (mmHg): 60 mmHg Ultrafiltration Rate (mL/min): 170 mL/min Dialysate Flow Rate (mL/min): 600 ml/min Conductivity: Machine : 13.9 Conductivity: Machine : 13.9 Dialysis Fluid Bolus: Normal Saline Bolus Amount (mL): 250 mL Dialysate Change:  (3K) Intra-Hemodialysis Comments: 121. resting    Objective:  Vital signs in last 24 hours:  Temp:  [98.4 F (36.9 C)] 98.4 F (36.9 C) (09/13 0946) Pulse Rate:  [92-113] 101 (09/13 1000) Resp:  [18-21] 19 (09/13 1000) BP: (123-162)/(56-76) 139/69 (09/13 1000) SpO2:  [95 %-98 %] 98 % (09/13 1000) Weight:  [57.8 kg (127 lb 6.8 oz)-59 kg (130 lb)] 57.8 kg (127 lb 6.8 oz) (09/13 0946)  Weight change: -3.084 kg (-6 lb 12.8 oz) Filed Weights   11/18/15 0500 11/19/15 0600 11/19/15 0946  Weight: 62.1 kg (136 lb 12.8 oz) 59 kg (130 lb) 57.8 kg (127 lb 6.8 oz)    Intake/Output: I/O last 3 completed shifts: In: 23 [P.O.:440; IV Piggyback:100] Out: 1300 [Urine:1300]   Intake/Output this shift:  Total I/O In: 240 [P.O.:240] Out: 0   Physical Exam: General: No acute distress  Head: Normocephalic, atraumatic. Moist oral mucosal membranes  Eyes: Anicteric  Neck: Supple, trachea midline  Lungs:  Clear to auscultation, normal effort  Heart: S1S2 no rubs  Abdomen:  Soft, mid upper abdominal tenderness noted  Extremities: Trace  peripheral edema.  Neurologic: Nonfocal, moving all four extremities  Skin: No lesions  Access: Right femoral HD temp  catheter 9/8, Dr. Isidore Moos    Basic Metabolic Panel:  Recent Labs Lab 11/14/15 0326 11/14/15 1335 11/15/15 0532 11/15/15 1100 11/16/15 0441 11/17/15 0430 11/18/15 0515 11/19/15 0449  NA 135  --  139  --  142 141 138 141  K 5.9*  --  3.6  --  3.5 4.0 4.2 4.3  CL 110  --  98*  --  97* 97* 97* 102  CO2 15*  --  30  --  34* 35* 29 28  GLUCOSE 201*  --  105*  --  127* 106* 124* 128*  BUN 85*  --  52*  --  36* 43* 45* 55*  CREATININE 5.68*  --  4.64*  --  3.95* 4.99* 5.82* 6.54*  CALCIUM 9.1  --  7.5*  --  7.3* 7.6* 7.9* 8.5*  MG 2.0  --  1.5*  --   --   --   --   --   PHOS 6.7* 4.9*  --  4.5  --   --   --   --     Liver Function Tests:  Recent Labs Lab 11/13/15 2138  AST 249*  ALT 272*  ALKPHOS 142*  BILITOT 1.4*  PROT 7.0  ALBUMIN 3.3*    Recent Labs Lab 11/14/15 0326  LIPASE 44   No results for input(s): AMMONIA in the last 168 hours.  CBC:  Recent Labs Lab 11/13/15 2138 11/14/15 0326 11/15/15 0532 11/17/15 0430  WBC 8.8 7.2 4.8 5.1  NEUTROABS 5.7  --   --  3.5  HGB 8.7* 9.2* 8.4* 8.3*  HCT 26.8*  26.3* 23.7* 24.4*  MCV 94.6 89.2 86.6 88.5  PLT 184 123* 121* 114*    Cardiac Enzymes:  Recent Labs Lab 11/13/15 2138  TROPONINI <0.03    BNP: Invalid input(s): POCBNP  CBG:  Recent Labs Lab 11/18/15 1634 11/18/15 2012 11/19/15 0041 11/19/15 0447 11/19/15 0729  GLUCAP 180* 206* 148* 125* 116*    Microbiology: Results for orders placed or performed during the hospital encounter of 11/13/15  MRSA PCR Screening     Status: None   Collection Time: 11/14/15  3:11 AM  Result Value Ref Range Status   MRSA by PCR NEGATIVE NEGATIVE Final    Comment:        The GeneXpert MRSA Assay (FDA approved for NASAL specimens only), is one component of a comprehensive MRSA colonization surveillance program. It is not intended to diagnose MRSA infection nor to guide or monitor treatment for MRSA infections.     Coagulation Studies: No  results for input(s): LABPROT, INR in the last 72 hours.  Urinalysis: No results for input(s): COLORURINE, LABSPEC, PHURINE, GLUCOSEU, HGBUR, BILIRUBINUR, KETONESUR, PROTEINUR, UROBILINOGEN, NITRITE, LEUKOCYTESUR in the last 72 hours.  Invalid input(s): APPERANCEUR    Imaging: No results found.   Medications:     . famotidine  20 mg Oral Daily  . gabapentin  100 mg Oral TID  . heparin subcutaneous  5,000 Units Subcutaneous Q12H  . iopamidol  30 mL Oral Once  . verapamil  80 mg Oral Daily   sodium chloride, acetaminophen, heparin lock flush, heparin lock flush, oxyCODONE, sodium chloride flush, sodium chloride flush  Assessment/ Plan:  50 y.o.hispanic female with diabetes mellitus type 2, hypertension, multiple myeloma on chemotherapy, osteoarthritis, chronic kidney disease stage III   1. Acute renal failure on CKD stage III baseline Cr 1.4 from 8/28/217.  Concern for progression of myeloma kidney.  - Will schedule for tunneled dialysis catheter to be placed. Discussed case with Dr. Lucky Cowboy. Make NPO and will attempt to be placed later today.  - Discussed outpatient dialysis.  - evaluate for dialysis daily. Dialysis today, monitor volume status, urine output, electrolytes and renal function.   2. 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 With thrombocytopenia. PRBC transfusion on 9/7 - Oncology: Dr. Grayland Ormond  3. Multiple Myeloma: on velcade and zometa. Held steroids due to hyperglycemia.   4. Hypertension: at goal.  - verapamil   LOS: [place], [place] 9/13/201710:58 AM

## 2015-11-20 ENCOUNTER — Encounter: Payer: Self-pay | Admitting: Vascular Surgery

## 2015-11-20 ENCOUNTER — Inpatient Hospital Stay: Payer: Medicaid Other

## 2015-11-20 LAB — CBC
HCT: 22.5 % — ABNORMAL LOW (ref 35.0–47.0)
Hemoglobin: 7.6 g/dL — ABNORMAL LOW (ref 12.0–16.0)
MCH: 30.6 pg (ref 26.0–34.0)
MCHC: 33.7 g/dL (ref 32.0–36.0)
MCV: 90.8 fL (ref 80.0–100.0)
PLATELETS: 110 10*3/uL — AB (ref 150–440)
RBC: 2.48 MIL/uL — AB (ref 3.80–5.20)
RDW: 18.6 % — ABNORMAL HIGH (ref 11.5–14.5)
WBC: 5.7 10*3/uL (ref 3.6–11.0)

## 2015-11-20 LAB — GLUCOSE, CAPILLARY
GLUCOSE-CAPILLARY: 114 mg/dL — AB (ref 65–99)
GLUCOSE-CAPILLARY: 128 mg/dL — AB (ref 65–99)
GLUCOSE-CAPILLARY: 128 mg/dL — AB (ref 65–99)
GLUCOSE-CAPILLARY: 177 mg/dL — AB (ref 65–99)
Glucose-Capillary: 173 mg/dL — ABNORMAL HIGH (ref 65–99)
Glucose-Capillary: 78 mg/dL (ref 65–99)

## 2015-11-20 LAB — RENAL FUNCTION PANEL
Albumin: 2.6 g/dL — ABNORMAL LOW (ref 3.5–5.0)
Anion gap: 10 (ref 5–15)
BUN: 41 mg/dL — AB (ref 6–20)
CALCIUM: 8.4 mg/dL — AB (ref 8.9–10.3)
CHLORIDE: 103 mmol/L (ref 101–111)
CO2: 25 mmol/L (ref 22–32)
CREATININE: 4.93 mg/dL — AB (ref 0.44–1.00)
GFR calc non Af Amer: 9 mL/min — ABNORMAL LOW (ref >60)
GFR, EST AFRICAN AMERICAN: 11 mL/min — AB (ref >60)
GLUCOSE: 114 mg/dL — AB (ref 65–99)
Phosphorus: 4.2 mg/dL (ref 2.5–4.6)
Potassium: 4.6 mmol/L (ref 3.5–5.1)
SODIUM: 138 mmol/L (ref 135–145)

## 2015-11-20 MED ORDER — VERAPAMIL HCL 80 MG PO TABS
40.0000 mg | ORAL_TABLET | ORAL | Status: DC
  Administered 2015-11-20 – 2015-11-26 (×7): 40 mg via ORAL
  Filled 2015-11-20 (×6): qty 1

## 2015-11-20 NOTE — Progress Notes (Signed)
R femoral HD catheter was removed per MD order.

## 2015-11-20 NOTE — Progress Notes (Signed)
Nursing student note. Communicated with nurse preceptor Pryor Montes about patient's respiratory status. D: Bilateral lower lung crackles this am. RR @ 20, with SaO2 @ 98 on RA. A: Increase ambulation in room  R: Diminished crackles in bilateral lower lung, vital signs stable.

## 2015-11-20 NOTE — Progress Notes (Addendum)
[facility] at [place] NAME: Jamie Keller    MR#:  [id number]  DATE OF BIRTH:  [birth year]  SUBJECTIVE: Spoke with the help of Spanish translator. Patient denies shortness of breath, cough, pedal edema. Appetite is improved. Patient received permanent dialysis catheter in the  Left  IJ yesterday.   CHIEF COMPLAINT:   Chief Complaint  Patient presents with  . Abdominal Pain  . Respiratory Distress     REVIEW OF SYSTEMS:    Review of Systems  Constitutional: Negative for chills and fever.  HENT: Negative for sore throat.   Eyes: Negative for blurred vision, double vision and pain.  Respiratory: Negative for cough, hemoptysis, shortness of breath and wheezing.   Cardiovascular: Negative for chest pain, palpitations, orthopnea and leg swelling.  Gastrointestinal: Negative for abdominal pain, constipation, diarrhea, heartburn, nausea and vomiting.  Genitourinary: Negative for dysuria and hematuria.  Musculoskeletal: Negative for back pain and joint pain.  Skin: Negative for rash.  Neurological: Negative for dizziness, sensory change, speech change, focal weakness and headaches.  Endo/Heme/Allergies: Does not bruise/bleed easily.  Psychiatric/Behavioral: Negative for depression. The patient is not nervous/anxious.     DRUG ALLERGIES:   Allergies  Allergen Reactions  . No Known Allergies     VITALS:  Blood pressure 136/72, pulse 94, temperature 98.6 F (37 C), temperature source Oral, resp. rate 20, height 5' 5"  (1.651 m), weight 57.9 kg (127 lb 10.3 oz), SpO2 98 %.  PHYSICAL EXAMINATION:   Physical Exam  GENERAL:  50 y.o.-year-old patient lying in the bed with no acute distress.  EYES: Pupils equal, round, reactive to light and accommodation. No scleral icterus. Extraocular muscles intact.  HEENT: Head atraumatic, normocephalic. Oropharynx and nasopharynx clear.  NECK:  Supple, no jugular venous distention. No  thyroid enlargement, no tenderness.  LUNGS: Normal breath sounds bilaterally, no wheezing, rales, rhonchi. No use of accessory muscles of respiration.  CARDIOVASCULAR: S1, S2 normal. No murmurs, rubs, or gallops.  ABDOMEN: Soft, nondistended. Bowel sounds present. No organomegaly or mass. Mild epigastric area tenderness EXTREMITIES: No cyanosis, clubbing or edema b/l.    NEUROLOGIC: Cranial nerves II through XII are intact. No focal Motor or sensory deficits b/l.   PSYCHIATRIC: The patient is alert and oriented x 3.  SKIN: No obvious rash, lesion, or ulcer.   LABORATORY PANEL:   CBC  Recent Labs Lab 11/20/15 0619  WBC 5.7  HGB 7.6*  HCT 22.5*  PLT 110*   ------------------------------------------------------------------------------------------------------------------ Chemistries   Recent Labs Lab 11/13/15 2138  11/15/15 0532  11/20/15 0619  NA 135  < > 139  < > 138  K 6.2*  < > 3.6  < > 4.6  CL 109  < > 98*  < > 103  CO2 11*  < > 30  < > 25  GLUCOSE 200*  < > 105*  < > 114*  BUN 89*  < > 52*  < > 41*  CREATININE 6.29*  < > 4.64*  < > 4.93*  CALCIUM 9.2  < > 7.5*  < > 8.4*  MG  --   < > 1.5*  --   --   AST 249*  --   --   --   --   ALT 272*  --   --   --   --   ALKPHOS 142*  --   --   --   --   BILITOT 1.4*  --   --   --   --   < > =  values in this interval not displayed. ------------------------------------------------------------------------------------------------------------------  Cardiac Enzymes  Recent Labs Lab 11/13/15 2138  TROPONINI <0.03   ------------------------------------------------------------------------------------------------------------------  RADIOLOGY:  No results found.   ASSESSMENT AND PLAN:   * Acute kidney injury over CKD3;Patient started to feel better, urine output 1050 yesterday. Patient needs intermittent dialysis, nephrology  is planning to  set up outpatient acute dialysis. Patient is aware of this. Discussed this with help  of Spanish translator. Possible progression of myeloma kidney. Scheduled for 24-hour creatinine clearance starting tomorrow. Possible discharge early next week.  P* Mild acute pancreatitis Improving. Pain medications as needed Tolerating food  * Anemia of chronic disease is stable  * Multiple myeloma Discussed with Dr. Grayland Ormond. Hold treatment at this point due to renal failure. Patient was on Velcade. Discussed with the help of translator, answered her questions. Discussed with  nephrologist also.   Hypertension: Does have tachycardia:continue Verapamil 80 mg daily,add  ,verapamil 40 mg in the vening  Appreciate presents a registered nurse in the room while rounding.    All the records are reviewed and case discussed with Care Management/Social Workerr. Management plans discussed with the patient, family and they are in agreement.  CODE STATUS: FULL CODE  DVT Prophylaxis: SCDs  TOTAL TIME TAKING CARE OF THIS PATIENT: 30 minutes.   POSSIBLE D/C IN 2-3 DAYS, DEPENDING ON CLINICAL CONDITION.  Epifanio Lesches M.D on 11/20/2015 at 12:36 PM  Between 7am to 6pm - Pager - [phone]  After 6pm go to [email or web address] - password [email or web address] [facility]  Office  [phone]  CC: Primary care physician; [name], MD  Note: This dictation was prepared with Dragon dictation along with smaller phrase technology. Any transcriptional errors that result from this process are unintentional.

## 2015-11-20 NOTE — Progress Notes (Signed)
[facility]  ROUNDING NOTE   Subjective:   History taken with assistance with Spanish interpreter.   [facility] permcath placed yesterday by Dr. Lucky Cowboy  Patient is starting to feel better. UOP 1050. Hemodialysis yesterday. Tolerated treatment well. No ultrafiltration.   Objective:  Vital signs in last 24 hours:  Temp:  [98.5 F (36.9 C)-98.8 F (37.1 C)] 98.6 F (37 C) (09/14 1221) Pulse Rate:  [94-120] 94 (09/14 1221) Resp:  [16-23] 20 (09/14 1221) BP: (126-150)/(48-72) 136/72 (09/14 1221) SpO2:  [95 %-98 %] 98 % (09/14 1221) Weight:  [57.9 kg (127 lb 10.3 oz)] 57.9 kg (127 lb 10.3 oz) (09/14 0426)  Weight change: -1.168 kg (-2 lb 9.2 oz) Filed Weights   11/19/15 0600 11/19/15 0946 11/20/15 0426  Weight: 59 kg (130 lb) 57.8 kg (127 lb 6.8 oz) 57.9 kg (127 lb 10.3 oz)    Intake/Output: I/O last 3 completed shifts: In: 300 [P.O.:300] Out: 899 [Urine:1050]   Intake/Output this shift:  Total I/O In: 300 [P.O.:300] Out: 375 [Urine:375]  Physical Exam: General: No acute distress  Head: Normocephalic, atraumatic. Moist oral mucosal membranes  Eyes: Anicteric  Neck: Supple, trachea midline  Lungs:  Clear to auscultation, normal effort  Heart: S1S2 no rubs  Abdomen:  Soft, mid upper abdominal tenderness noted  Extremities: No peripheral edema.  Neurologic: Nonfocal, moving all four extremities  Skin: No lesions  Access: Right femoral HD temp catheter 9/8, Dr. Lorrene Reid permcath 9/13 Dr. Lucky Cowboy    Basic Metabolic Panel:  Recent Labs Lab 11/14/15 0326 11/14/15 1335 11/15/15 0532 11/15/15 1100 11/16/15 0441 11/17/15 0430 11/18/15 0515 11/19/15 0449 11/20/15 0619  NA 135  --  139  --  142 141 138 141 138  K 5.9*  --  3.6  --  3.5 4.0 4.2 4.3 4.6  CL 110  --  98*  --  97* 97* 97* 102 103  CO2 15*  --  30  --  34* 35* _0 GLUCOSE 201*  --  105*  --  127* 106* 124* 128* 114*  BUN 85*  --  52*  --  36* 43* 45* 55* 41*  CREATININE 5.68*  --  4.64*   --  3.95* 4.99* 5.82* 6.54* 4.93*  CALCIUM 9.1  --  7.5*  --  7.3* 7.6* 7.9* 8.5* 8.4*  MG 2.0  --  1.5*  --   --   --   --   --   --   PHOS 6.7* 4.9*  --  4.5  --   --   --   --  4.2    Liver Function Tests:  Recent Labs Lab 11/13/15 2138 11/20/15 0619  AST 249*  --   ALT 272*  --   ALKPHOS 142*  --   BILITOT 1.4*  --   PROT 7.0  --   ALBUMIN 3.3* 2.6*    Recent Labs Lab 11/14/15 0326  LIPASE 44   No results for input(s): AMMONIA in the last 168 hours.  CBC:  Recent Labs Lab 11/13/15 2138 11/14/15 0326 11/15/15 0532 11/17/15 0430 11/20/15 0619  WBC 8.8 7.2 4.8 5.1 5.7  NEUTROABS 5.7  --   --  3.5  --   HGB 8.7* 9.2* 8.4* 8.3* 7.6*  HCT 26.8* 26.3* 23.7* 24.4* 22.5*  MCV 94.6 89.2 86.6 88.5 90.8  PLT 184 123* 121* 114* 110*    Cardiac Enzymes:  Recent Labs Lab 11/13/15 2138  TROPONINI <0.03  BNP: Invalid input(s): POCBNP  CBG:  Recent Labs Lab 11/19/15 2103 11/20/15 0020 11/20/15 0431 11/20/15 0731 11/20/15 1155  GLUCAP 151* 128* 128* 114* 173*    Microbiology: Results for orders placed or performed during the hospital encounter of 11/13/15  MRSA PCR Screening     Status: None   Collection Time: 11/14/15  3:11 AM  Result Value Ref Range Status   MRSA by PCR NEGATIVE NEGATIVE Final    Comment:        The GeneXpert MRSA Assay (FDA approved for NASAL specimens only), is one component of a comprehensive MRSA colonization surveillance program. It is not intended to diagnose MRSA infection nor to guide or monitor treatment for MRSA infections.     Coagulation Studies: No results for input(s): LABPROT, INR in the last 72 hours.  Urinalysis: No results for input(s): COLORURINE, LABSPEC, PHURINE, GLUCOSEU, HGBUR, BILIRUBINUR, KETONESUR, PROTEINUR, UROBILINOGEN, NITRITE, LEUKOCYTESUR in the last 72 hours.  Invalid input(s): APPERANCEUR    Imaging: No results found.   Medications:     . famotidine  20 mg Oral Daily  .  gabapentin  100 mg Oral TID  . heparin subcutaneous  5,000 Units Subcutaneous Q12H  . insulin aspart  0-5 Units Subcutaneous QHS  . insulin aspart  0-9 Units Subcutaneous TID WC  . iopamidol  30 mL Oral Once  . verapamil  80 mg Oral Daily   sodium chloride, acetaminophen, heparin lock flush, heparin lock flush, oxyCODONE, sodium chloride flush, sodium chloride flush  Assessment/ Plan:  49 y.o.hispanic female with diabetes mellitus type 2, hypertension, multiple myeloma on chemotherapy, osteoarthritis, chronic kidney disease stage III   1. Acute renal failure on CKD stage III baseline Cr 1.4 from 8/28/217.  Concern for progression of myeloma kidney. Tunneled catheter placed.  - will need intermittent dialysis. Set up outpatient acute dialysis - Schedule 24 hour for creatinine clearance starting tomorrow.  - CXR for TB exposure.   2. 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 With thrombocytopenia. PRBC transfusion on 9/7 - Oncology: Dr. Grayland Ormond  3. Multiple Myeloma: on velcade and zometa. Held steroids due to hyperglycemia.   4. Hypertension: at goal.  - verapamil   LOS: [place], Fatimata Talsma 9/14/201712:27 PM

## 2015-11-20 NOTE — Care Management (Addendum)
Spoke with [name] from financially counseling [phone]. States that patient has initiated application for emergency Medicaid.

## 2015-11-21 ENCOUNTER — Inpatient Hospital Stay: Payer: Medicaid Other

## 2015-11-21 ENCOUNTER — Encounter: Payer: Self-pay | Admitting: Radiology

## 2015-11-21 LAB — CBC
HEMATOCRIT: 21.1 % — AB (ref 35.0–47.0)
Hemoglobin: 7.3 g/dL — ABNORMAL LOW (ref 12.0–16.0)
MCH: 30.9 pg (ref 26.0–34.0)
MCHC: 34.5 g/dL (ref 32.0–36.0)
MCV: 89.8 fL (ref 80.0–100.0)
PLATELETS: 113 10*3/uL — AB (ref 150–440)
RBC: 2.35 MIL/uL — AB (ref 3.80–5.20)
RDW: 18.7 % — AB (ref 11.5–14.5)
WBC: 6.6 10*3/uL (ref 3.6–11.0)

## 2015-11-21 LAB — GLUCOSE, CAPILLARY
GLUCOSE-CAPILLARY: 140 mg/dL — AB (ref 65–99)
GLUCOSE-CAPILLARY: 211 mg/dL — AB (ref 65–99)
Glucose-Capillary: 132 mg/dL — ABNORMAL HIGH (ref 65–99)
Glucose-Capillary: 182 mg/dL — ABNORMAL HIGH (ref 65–99)

## 2015-11-21 LAB — BASIC METABOLIC PANEL
ANION GAP: 11 (ref 5–15)
BUN: 48 mg/dL — AB (ref 6–20)
CO2: 25 mmol/L (ref 22–32)
Calcium: 9 mg/dL (ref 8.9–10.3)
Chloride: 103 mmol/L (ref 101–111)
Creatinine, Ser: 5.71 mg/dL — ABNORMAL HIGH (ref 0.44–1.00)
GFR calc Af Amer: 9 mL/min — ABNORMAL LOW (ref >60)
GFR calc non Af Amer: 8 mL/min — ABNORMAL LOW (ref >60)
GLUCOSE: 138 mg/dL — AB (ref 65–99)
POTASSIUM: 4.8 mmol/L (ref 3.5–5.1)
Sodium: 139 mmol/L (ref 135–145)

## 2015-11-21 LAB — RENAL FUNCTION PANEL
Albumin: 2.7 g/dL — ABNORMAL LOW (ref 3.5–5.0)
Anion gap: 11 (ref 5–15)
BUN: 47 mg/dL — AB (ref 6–20)
CHLORIDE: 103 mmol/L (ref 101–111)
CO2: 25 mmol/L (ref 22–32)
CREATININE: 5.76 mg/dL — AB (ref 0.44–1.00)
Calcium: 9 mg/dL (ref 8.9–10.3)
GFR calc Af Amer: 9 mL/min — ABNORMAL LOW (ref >60)
GFR, EST NON AFRICAN AMERICAN: 8 mL/min — AB (ref >60)
Glucose, Bld: 138 mg/dL — ABNORMAL HIGH (ref 65–99)
POTASSIUM: 4.8 mmol/L (ref 3.5–5.1)
Phosphorus: 4.4 mg/dL (ref 2.5–4.6)
Sodium: 139 mmol/L (ref 135–145)

## 2015-11-21 LAB — HEMOGLOBIN A1C
HEMOGLOBIN A1C: 7.2 % — AB (ref 4.8–5.6)
Mean Plasma Glucose: 160 mg/dL

## 2015-11-21 LAB — TSH: TSH: 1.938 u[IU]/mL (ref 0.350–4.500)

## 2015-11-21 MED ORDER — TECHNETIUM TC 99M DIETHYLENETRIAME-PENTAACETIC ACID
30.0000 | Freq: Once | INTRAVENOUS | Status: AC | PRN
  Administered 2015-11-21: 29.814 via INTRAVENOUS

## 2015-11-21 MED ORDER — TECHNETIUM TO 99M ALBUMIN AGGREGATED
4.0000 | Freq: Once | INTRAVENOUS | Status: AC | PRN
  Administered 2015-11-21: 2.565 via INTRAVENOUS

## 2015-11-21 NOTE — Progress Notes (Signed)
[facility] NAME: Jamie Keller    MR#:  [id number]  DATE OF BIRTH:  August 23, 1965  SUBJECTIVE: Spoke with the help of Spanish translator. Patient admits of shortness of breath, less cough, pedal edema. Appetite has improved. Patient received permanent dialysis catheter in the  Left  IJ , day before yesterday. Feels comfortable. Management is working to establish dialysis facility. Patient is concerned about dialysis, is wondering if her kidney function will improve. She is also complaining of hunger pains in midabdomen .   CHIEF COMPLAINT:   Chief Complaint  Patient presents with  . Abdominal Pain  . Respiratory Distress     REVIEW OF SYSTEMS:    Review of Systems  Constitutional: Negative for chills and fever.  HENT: Negative for sore throat.   Eyes: Negative for blurred vision, double vision and pain.  Respiratory: Negative for cough, hemoptysis, shortness of breath and wheezing.   Cardiovascular: Negative for chest pain, palpitations, orthopnea and leg swelling.  Gastrointestinal: Negative for abdominal pain, constipation, diarrhea, heartburn, nausea and vomiting.  Genitourinary: Negative for dysuria and hematuria.  Musculoskeletal: Negative for back pain and joint pain.  Skin: Negative for rash.  Neurological: Negative for dizziness, sensory change, speech change, focal weakness and headaches.  Endo/Heme/Allergies: Does not bruise/bleed easily.  Psychiatric/Behavioral: Negative for depression. The patient is not nervous/anxious.     DRUG ALLERGIES:   Allergies  Allergen Reactions  . No Known Allergies     VITALS:  Blood pressure (!) 134/59, pulse (!) 112, temperature 98.8 F (37.1 C), temperature source Oral, resp. rate 20, height 5' 5"  (1.651 m), weight 58.5 kg (128 lb 15.5 oz), SpO2 97 %.  PHYSICAL EXAMINATION:   Physical Exam  GENERAL:  50 y.o.-year-old patient lying in the bed with no acute  distress.  EYES: Pupils equal, round, reactive to light and accommodation. No scleral icterus. Extraocular muscles intact.  HEENT: Head atraumatic, normocephalic. Oropharynx and nasopharynx clear.  NECK:  Supple, no jugular venous distention. No thyroid enlargement, no tenderness.  LUNGS: Diminished breath sounds bilaterally at bases, right basilar crackles, no wheezing, rales, few rhonchi. Intermittent use of accessory muscles of respiration, mostly with movement, speech.  CARDIOVASCULAR: S1, S2 normal. No murmurs, rubs, or gallops.  ABDOMEN: Soft, nondistended. Bowel sounds present. No organomegaly or mass. Mild epigastric area tenderness EXTREMITIES: No cyanosis, clubbing or edema b/l.    NEUROLOGIC: Cranial nerves II through XII are intact. No focal Motor or sensory deficits b/l.   PSYCHIATRIC: The patient is alert and oriented x 3.  SKIN: No obvious rash, lesion, or ulcer.   LABORATORY PANEL:   CBC  Recent Labs Lab 11/21/15 0415  WBC 6.6  HGB 7.3*  HCT 21.1*  PLT 113*   ------------------------------------------------------------------------------------------------------------------ Chemistries   Recent Labs Lab 11/15/15 0532  11/21/15 0415  NA 139  < > 139  139  K 3.6  < > 4.8  4.8  CL 98*  < > 103  103  CO2 30  < > 25  25  GLUCOSE 105*  < > 138*  138*  BUN 52*  < > 47*  48*  CREATININE 4.64*  < > 5.76*  5.71*  CALCIUM 7.5*  < > 9.0  9.0  MG 1.5*  --   --   < > = values in this interval not displayed. ------------------------------------------------------------------------------------------------------------------  Cardiac Enzymes No results for input(s): TROPONINI in the last 168 hours. ------------------------------------------------------------------------------------------------------------------  RADIOLOGY:  Dg  Chest 2 View  Result Date: 11/20/2015 CLINICAL DATA:  Abdominal pain.  TB. EXAM: CHEST  2 VIEW COMPARISON:  11/13/2015 FINDINGS:  Cardiopericardial enlargement that is stable. New dialysis catheter from the left with tips at the right atrium. Small pleural effusions and bibasilar atelectasis, increased. No septal thickening to suggest edema. History of multiple myeloma with spinal sclerosis and chronic T11 compression fracture IMPRESSION: Increased pleural effusions and bibasilar atelectasis. Cannot exclude superimposed pneumonia. Electronically Signed   By: Monte Fantasia M.D.   On: 11/20/2015 14:01     ASSESSMENT AND PLAN:   * Acute kidney injury over CKD3;Patient started to feel better, urine output 1350 with a past 24 hours. Patient needs intermittent dialysis, nephrology  is planning to  set up outpatient acute dialysis. Patient is aware of this. Discussed this with help of Spanish translator. Possible progression of myeloma kidney. Scheduled for 24-hour creatinine clearance starting today, however, patient's nurses are not aware about that. Possible discharge early next week. [place] discussed with Dr. Juleen China  * Mid abdominal pain, hunger pain, thought to be due to Mild acute pancreatitis Improving. Pain medications as needed Tolerating food. Get H. pylori testing  * Anemia of chronic disease is stable  * Multiple myeloma Discussed with Dr. Grayland Ormond. Hold treatment at this point due to renal failure. Patient was on Velcade. Discussed with the help of translator, answered her questions. Discussed with  nephrologist also.   *Hypertension: Does have tachycardia:continue Verapamil 80 mg daily,add  ,verapamil 40 mg in the Evening. Blood pressure readings have improved significantly, but not heart rate, patient remains in sinus tachycardia. Get V/Q SCAN   * Sinus tachycardia of unclear etiology, patient is being continued on verapamil with no significant improvement, patient is not hypoxic, however, dyspneic, get VQ scan, get TSH    All the records are reviewed and case discussed with Care Management/Social  Workerr. Management plans discussed with the patient, family and they are in agreement.  CODE STATUS: FULL CODE  DVT Prophylaxis: SCDs  TOTAL TIME TAKING CARE OF THIS PATIENT: 30 minutes.   POSSIBLE D/C IN 2-3 DAYS, DEPENDING ON CLINICAL CONDITION.  Theodoro Grist M.D on 11/21/2015 at 2:12 PM  Between 7am to 6pm - Pager - [phone]  After 6pm go to [email or web address] - password [email or web address]  Office  [phone]  CC: Primary care physician; [name], MD  Note: This dictation was prepared with Dragon dictation along with smaller phrase technology. Any transcriptional errors that result from this process are unintentional.

## 2015-11-21 NOTE — Care Management (Signed)
Initial dialysis order faxed to [facility] Relations Liaison

## 2015-11-21 NOTE — Progress Notes (Signed)
[facility]  ROUNDING NOTE   Subjective:   History taken with assistance with Spanish interpreter.   Patient undergoing 24 hour urine collection for creatinine clearance  Objective:  Vital signs in last 24 hours:  Temp:  [98.5 F (36.9 C)-98.6 F (37 C)] 98.6 F (37 C) (09/15 0451) Pulse Rate:  [94-111] 108 (09/15 1024) Resp:  [20] 20 (09/15 0451) BP: (132-141)/(59-72) 141/66 (09/15 1024) SpO2:  [96 %-98 %] 97 % (09/15 0451) Weight:  [58.5 kg (128 lb 15.5 oz)] 58.5 kg (128 lb 15.5 oz) (09/15 0451)  Weight change: 0.7 kg (1 lb 8.7 oz) Filed Weights   11/19/15 0946 11/20/15 0426 11/21/15 0451  Weight: 57.8 kg (127 lb 6.8 oz) 57.9 kg (127 lb 10.3 oz) 58.5 kg (128 lb 15.5 oz)    Intake/Output: I/O last 3 completed shifts: In: 92 [P.O.:780] Out: 1900 [Urine:1900]   Intake/Output this shift:  Total I/O In: -  Out: 250 [Urine:250]  Physical Exam: General: No acute distress  Head: Normocephalic, atraumatic. Moist oral mucosal membranes  Eyes: Anicteric  Neck: Supple, trachea midline  Lungs:  Clear to auscultation, normal effort  Heart: S1S2 no rubs  Abdomen:  Soft, mid upper abdominal tenderness noted  Extremities: No peripheral edema.  Neurologic: Nonfocal, moving all four extremities  Skin: No lesions  Access: Right femoral HD temp catheter 9/8, Dr. Lorrene Reid permcath 9/13 Dr. Lucky Cowboy    Basic Metabolic Panel:  Recent Labs Lab 11/14/15 1335 11/15/15 0532 11/15/15 1100  11/17/15 0430 11/18/15 0515 11/19/15 0449 11/20/15 0619 11/21/15 0415  NA  --  139  --   < > 141 138 141 138 139  139  K  --  3.6  --   < > 4.0 4.2 4.3 4.6 4.8  4.8  CL  --  98*  --   < > 97* 97* 102 103 103  103  CO2  --  30  --   < > 35* _0 GLUCOSE  --  105*  --   < > 106* 124* 128* 114* 138*  138*  BUN  --  52*  --   < > 43* 45* 55* 41* 47*  48*  CREATININE  --  4.64*  --   < > 4.99* 5.82* 6.54* 4.93* 5.76*  5.71*  CALCIUM  --  7.5*  --   < > 7.6*  7.9* 8.5* 8.4* 9.0  9.0  MG  --  1.5*  --   --   --   --   --   --   --   PHOS 4.9*  --  4.5  --   --   --   --  4.2 4.4  < > = values in this interval not displayed.  Liver Function Tests:  Recent Labs Lab 11/20/15 0619 11/21/15 0415  ALBUMIN 2.6* 2.7*   No results for input(s): LIPASE, AMYLASE in the last 168 hours. No results for input(s): AMMONIA in the last 168 hours.  CBC:  Recent Labs Lab 11/15/15 0532 11/17/15 0430 11/20/15 0619 11/21/15 0415  WBC 4.8 5.1 5.7 6.6  NEUTROABS  --  3.5  --   --   HGB 8.4* 8.3* 7.6* 7.3*  HCT 23.7* 24.4* 22.5* 21.1*  MCV 86.6 88.5 90.8 89.8  PLT 121* 114* 110* 113*    Cardiac Enzymes: No results for input(s): CKTOTAL, CKMB, CKMBINDEX, TROPONINI in the last 168 hours.  BNP: Invalid input(s): POCBNP  CBG:  Recent Labs Lab 11/20/15 1155 11/20/15 1630 11/20/15 2125 11/21/15 0728 11/21/15 1134  GLUCAP 173* 76 177* 84* 67*    Microbiology: Results for orders placed or performed during the hospital encounter of 11/13/15  MRSA PCR Screening     Status: None   Collection Time: 11/14/15  3:11 AM  Result Value Ref Range Status   MRSA by PCR NEGATIVE NEGATIVE Final    Comment:        The GeneXpert MRSA Assay (FDA approved for NASAL specimens only), is one component of a comprehensive MRSA colonization surveillance program. It is not intended to diagnose MRSA infection nor to guide or monitor treatment for MRSA infections.     Coagulation Studies: No results for input(s): LABPROT, INR in the last 72 hours.  Urinalysis: No results for input(s): COLORURINE, LABSPEC, PHURINE, GLUCOSEU, HGBUR, BILIRUBINUR, KETONESUR, PROTEINUR, UROBILINOGEN, NITRITE, LEUKOCYTESUR in the last 72 hours.  Invalid input(s): APPERANCEUR    Imaging: Dg Chest 2 View  Result Date: 11/20/2015 CLINICAL DATA:  Abdominal pain.  TB. EXAM: CHEST  2 VIEW COMPARISON:  11/13/2015 FINDINGS: Cardiopericardial enlargement that is stable. New  dialysis catheter from the left with tips at the right atrium. Small pleural effusions and bibasilar atelectasis, increased. No septal thickening to suggest edema. History of multiple myeloma with spinal sclerosis and chronic T11 compression fracture IMPRESSION: Increased pleural effusions and bibasilar atelectasis. Cannot exclude superimposed pneumonia. Electronically Signed   By: Monte Fantasia M.D.   On: 11/20/2015 14:01     Medications:     . gabapentin  100 mg Oral TID  . heparin subcutaneous  5,000 Units Subcutaneous Q12H  . insulin aspart  0-5 Units Subcutaneous QHS  . insulin aspart  0-9 Units Subcutaneous TID WC  . iopamidol  30 mL Oral Once  . verapamil  40 mg Oral Q24H  . verapamil  80 mg Oral Daily   sodium chloride, acetaminophen, heparin lock flush, heparin lock flush, oxyCODONE, sodium chloride flush, sodium chloride flush  Assessment/ Plan:  50 y.o.hispanic female with diabetes mellitus type 2, hypertension, multiple myeloma on chemotherapy, osteoarthritis, chronic kidney disease stage III   1. Acute renal failure on CKD stage III baseline Cr 1.4 from 8/28/217.  Concern for progression of myeloma kidney. Tunneled catheter placed.  - will need intermittent dialysis. Set up outpatient acute dialysis - 24 hour for creatinine clearance    2. 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 With thrombocytopenia. PRBC transfusion on 9/7 - Oncology: Dr. Grayland Ormond  3. Multiple Myeloma: on velcade and zometa. Held steroids due to hyperglycemia.   4. Hypertension: at goal.  - verapamil   LOS: [place], [place] 9/15/201712:18 PM

## 2015-11-21 NOTE — Progress Notes (Signed)
Patient up and voided about 06:00.  First urine discarded; 24 hour urine collection begun with 07:45 void.

## 2015-11-22 LAB — CREATININE CLEARANCE, URINE, 24 HOUR
CREAT CLEAR: 7 mL/min — AB (ref 75–115)
CREATININE 24H UR: 625 mg/d (ref 600–1800)
Collection Interval-CRCL: 24 hours
Creatinine, Urine: 41 mg/dL
Urine Total Volume-CRCL: 1525 mL

## 2015-11-22 LAB — CBC
HCT: 21 % — ABNORMAL LOW (ref 35.0–47.0)
HEMOGLOBIN: 7.2 g/dL — AB (ref 12.0–16.0)
MCH: 31.1 pg (ref 26.0–34.0)
MCHC: 34.2 g/dL (ref 32.0–36.0)
MCV: 91.1 fL (ref 80.0–100.0)
Platelets: 117 10*3/uL — ABNORMAL LOW (ref 150–440)
RBC: 2.31 MIL/uL — ABNORMAL LOW (ref 3.80–5.20)
RDW: 19 % — AB (ref 11.5–14.5)
WBC: 6.7 10*3/uL (ref 3.6–11.0)

## 2015-11-22 LAB — RENAL FUNCTION PANEL
ANION GAP: 10 (ref 5–15)
Albumin: 2.7 g/dL — ABNORMAL LOW (ref 3.5–5.0)
BUN: 62 mg/dL — AB (ref 6–20)
CALCIUM: 9.2 mg/dL (ref 8.9–10.3)
CO2: 22 mmol/L (ref 22–32)
Chloride: 104 mmol/L (ref 101–111)
Creatinine, Ser: 6.49 mg/dL — ABNORMAL HIGH (ref 0.44–1.00)
GFR calc Af Amer: 8 mL/min — ABNORMAL LOW (ref >60)
GFR calc non Af Amer: 7 mL/min — ABNORMAL LOW (ref >60)
GLUCOSE: 165 mg/dL — AB (ref 65–99)
POTASSIUM: 4.7 mmol/L (ref 3.5–5.1)
Phosphorus: 4.8 mg/dL — ABNORMAL HIGH (ref 2.5–4.6)
SODIUM: 136 mmol/L (ref 135–145)

## 2015-11-22 LAB — GLUCOSE, CAPILLARY
GLUCOSE-CAPILLARY: 176 mg/dL — AB (ref 65–99)
GLUCOSE-CAPILLARY: 136 mg/dL — AB (ref 65–99)
Glucose-Capillary: 150 mg/dL — ABNORMAL HIGH (ref 65–99)
Glucose-Capillary: 162 mg/dL — ABNORMAL HIGH (ref 65–99)

## 2015-11-22 NOTE — Progress Notes (Signed)
[facility]  ROUNDING NOTE   Subjective:   History taken with assistance with Spanish interpreter.   Pending 24 hour urine collection for creatinine clearance.   Objective:  Vital signs in last 24 hours:  Temp:  [98.6 F (37 C)-98.8 F (37.1 C)] 98.6 F (37 C) (09/16 1133) Pulse Rate:  [91-104] 91 (09/16 1133) Resp:  [18-20] 18 (09/16 1133) BP: (122-131)/(59-63) 122/63 (09/16 1133) SpO2:  [97 %-99 %] 99 % (09/16 1133) Weight:  [58.3 kg (128 lb 8.5 oz)] 58.3 kg (128 lb 8.5 oz) (09/16 0441)  Weight change: -0.2 kg (-7.1 oz) Filed Weights   11/20/15 0426 11/21/15 0451 11/22/15 0441  Weight: 57.9 kg (127 lb 10.3 oz) 58.5 kg (128 lb 15.5 oz) 58.3 kg (128 lb 8.5 oz)    Intake/Output: I/O last 3 completed shifts: In: 480 [P.O.:480] Out: 775 [Urine:775]   Intake/Output this shift:  No intake/output data recorded.  Physical Exam: General: No acute distress  Head: Normocephalic, atraumatic. Moist oral mucosal membranes  Eyes: Anicteric  Neck: Supple, trachea midline  Lungs:  Clear to auscultation, normal effort  Heart: S1S2 no rubs  Abdomen:  Soft, mid upper abdominal tenderness noted  Extremities: No peripheral edema.  Neurologic: Nonfocal, moving all four extremities  Skin: No lesions  Access: [facility] permcath 9/13 Dr. Lucky Cowboy    Basic Metabolic Panel:  Recent Labs Lab 11/18/15 0515 11/19/15 0449 11/20/15 0619 11/21/15 0415 11/22/15 0557  NA 138 141 138 139  139 136  K 4.2 4.3 4.6 4.8  4.8 4.7  CL 97* 102 103 103  103 104  CO2 29 28 25 25  25 22   GLUCOSE 124* 128* 114* 138*  138* 165*  BUN 45* 55* 41* 47*  48* 62*  CREATININE 5.82* 6.54* 4.93* 5.76*  5.71* 6.49*  CALCIUM 7.9* 8.5* 8.4* 9.0  9.0 9.2  PHOS  --   --  4.2 4.4 4.8*    Liver Function Tests:  Recent Labs Lab 11/20/15 0619 11/21/15 0415 11/22/15 0557  ALBUMIN 2.6* 2.7* 2.7*   No results for input(s): LIPASE, AMYLASE in the last 168 hours. No results for input(s): AMMONIA in  the last 168 hours.  CBC:  Recent Labs Lab 11/17/15 0430 11/20/15 0619 11/21/15 0415 11/22/15 0557  WBC 5.1 5.7 6.6 6.7  NEUTROABS 3.5  --   --   --   HGB 8.3* 7.6* 7.3* 7.2*  HCT 24.4* 22.5* 21.1* 21.0*  MCV 88.5 90.8 89.8 91.1  PLT 114* 110* 113* 117*    Cardiac Enzymes: No results for input(s): CKTOTAL, CKMB, CKMBINDEX, TROPONINI in the last 168 hours.  BNP: Invalid input(s): POCBNP  CBG:  Recent Labs Lab 11/21/15 1134 11/21/15 1644 11/21/15 2127 11/22/15 0740 11/22/15 1149  GLUCAP 182* 140* 211* 150* 162*    Microbiology: Results for orders placed or performed during the hospital encounter of 11/13/15  MRSA PCR Screening     Status: None   Collection Time: 11/14/15  3:11 AM  Result Value Ref Range Status   MRSA by PCR NEGATIVE NEGATIVE Final    Comment:        The GeneXpert MRSA Assay (FDA approved for NASAL specimens only), is one component of a comprehensive MRSA colonization surveillance program. It is not intended to diagnose MRSA infection nor to guide or monitor treatment for MRSA infections.     Coagulation Studies: No results for input(s): LABPROT, INR in the last 72 hours.  Urinalysis: No results for input(s): COLORURINE, LABSPEC, [place], [place],  HGBUR, BILIRUBINUR, KETONESUR, PROTEINUR, UROBILINOGEN, NITRITE, LEUKOCYTESUR in the last 72 hours.  Invalid input(s): APPERANCEUR    Imaging: Dg Chest 2 View  Result Date: 11/21/2015 CLINICAL DATA:  50 year old with end-stage renal disease on hemodialysis presenting with generalized weakness, cough and shortness of breath. Follow-up bilateral pleural effusions. EXAM: CHEST  2 VIEW COMPARISON:  11/20/2015, 11/13/2015 and earlier, including CT chest 09/02/2015 and earlier. FINDINGS: Cardiac silhouette markedly enlarged. Pulmonary venous hypertension and mild diffuse interstitial pulmonary edema, increased since yesterday. Bilateral pleural effusions, left greater than right, with associated  consolidation the lower lobes, slightly increased since yesterday. Left jugular dialysis catheter tips in the right atrium, unchanged. Right jugular Port-A-Cath tip at or near the cavoatrial junction, unchanged. Visualized bony thorax intact. Old compression fracture of the upper endplate of K18, unchanged. IMPRESSION: Worsening CHF and/or fluid overload, with increased interstitial pulmonary edema and enlarging bilateral pleural effusions since yesterday. Worsening associated passive atelectasis and/or pneumonia involving the lower lobes, left greater than right. Electronically Signed   By: Evangeline Dakin M.D.   On: 11/21/2015 15:59   Dg Chest 2 View  Result Date: 11/20/2015 CLINICAL DATA:  Abdominal pain.  TB. EXAM: CHEST  2 VIEW COMPARISON:  11/13/2015 FINDINGS: Cardiopericardial enlargement that is stable. New dialysis catheter from the left with tips at the right atrium. Small pleural effusions and bibasilar atelectasis, increased. No septal thickening to suggest edema. History of multiple myeloma with spinal sclerosis and chronic T11 compression fracture IMPRESSION: Increased pleural effusions and bibasilar atelectasis. Cannot exclude superimposed pneumonia. Electronically Signed   By: Monte Fantasia M.D.   On: 11/20/2015 14:01   Nm Pulmonary Perf And Vent  Result Date: 11/21/2015 CLINICAL DATA:  Subacute onset of dyspnea and shortness of breath. Initial encounter. EXAM: NUCLEAR MEDICINE VENTILATION - PERFUSION LUNG SCAN TECHNIQUE: Ventilation images were obtained in multiple projections using inhaled aerosol Tc-81mDTPA. Perfusion images were obtained in multiple projections after intravenous injection of Tc-989mAA. RADIOPHARMACEUTICALS:  29.814 mCi Technetium-9945mPA aerosol inhalation and 2.565 mCi Technetium-2m39m IV COMPARISON:  Chest radiograph performed earlier today at 3:44 p.m. FINDINGS: Ventilation: Diffuse heterogeneity is noted with regard to ventilation within both lungs, with  numerous small peripheral defects seen bilaterally. No large segmental ventilation defects are identified. Perfusion: There is minimal peripheral heterogeneity of perfusion, without a focal defect to suggest pulmonary embolus. Perfusion of both lungs is much more normal in appearance than corresponding ventilation. IMPRESSION: Low probability for pulmonary embolus. Scattered small peripheral ventilation defects, without perfusion abnormality. Electronically Signed   By: JeffGarald Balding.   On: 11/21/2015 19:59     Medications:     . gabapentin  100 mg Oral TID  . heparin subcutaneous  5,000 Units Subcutaneous Q12H  . insulin aspart  0-5 Units Subcutaneous QHS  . insulin aspart  0-9 Units Subcutaneous TID WC  . iopamidol  30 mL Oral Once  . verapamil  40 mg Oral Q24H  . verapamil  80 mg Oral Daily   sodium chloride, acetaminophen, heparin lock flush, heparin lock flush, oxyCODONE, sodium chloride flush, sodium chloride flush  Assessment/ Plan:  50 y69.hispanic female with diabetes mellitus type 2, hypertension, multiple myeloma on chemotherapy, osteoarthritis, chronic kidney disease stage III   1. Acute renal failure on CKD stage III baseline Cr 1.4 from 8/28/217.  Concern for progression of myeloma kidney. Tunneled catheter placed.  - will need intermittent dialysis until new baseline renal function established. Set up outpatient acute dialysis at [facility]  have been sent.  - 24 hour for creatinine clearance pending.   2. 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 With thrombocytopenia. PRBC transfusion on 9/7 - Oncology: Dr. Grayland Ormond  3. Multiple Myeloma: on velcade and zometa. Held steroids due to hyperglycemia.   4. Hypertension: at goal.  - verapamil   LOS: [place], [place] 9/16/20171:09 PM

## 2015-11-22 NOTE — Evaluation (Signed)
Occupational Therapy Evaluation Patient Details Name: Jamie Keller MRN: [id number] DOB: [birth year] Today's Date: 11/22/2015    History of Present Illness 50yo female with CKD3, mild acute pancreatitis, anemia, multiple myeloma, HTN, sinus tachycardia admitted for abdominal pain and respiratory distress.   Clinical Impression   Spanish interpreter utilized for OT evaluation and treatment session. Pt presents with decreased activity tolerance and overall BUE strength (grip strength within functional limits), however independent with all basic ADL and no report of pain. Pt educated in energy conservation strategies to support maximal independence in ADL/IADL at home as well as to minimize falls risk given low energy levels with pt verbalizing understanding. No additional OT needs at this time. Pt plans to return home with intermittent help from family members.     Follow Up Recommendations  No OT follow up    Equipment Recommendations  None recommended by OT    Recommendations for Other Services       Precautions / Restrictions Precautions Precautions: None      Mobility Bed Mobility Overal bed mobility: Independent                Transfers Overall transfer level: Independent                    Balance Overall balance assessment: Independent                                          ADL Overall ADL's : Independent                                       General ADL Comments: Pt indep with all basic self care tasks     Vision Vision Assessment?: No apparent visual deficits   Perception     Praxis Praxis Praxis tested?: Within functional limits    Pertinent Vitals/Pain Pain Assessment: No/denies pain     Hand Dominance     Extremity/Trunk Assessment Upper Extremity Assessment Upper Extremity Assessment: Generalized weakness   Lower Extremity Assessment Lower Extremity Assessment: Overall WFL for  tasks assessed       Communication Communication Communication: Interpreter utilized   Cognition Arousal/Alertness: Awake/alert Behavior During Therapy: WFL for tasks assessed/performed Overall Cognitive Status: Within Functional Limits for tasks assessed                     General Comments       Exercises       Shoulder Instructions      Home Living Family/patient expects to be discharged to:: Private residence Living Arrangements: Spouse/significant other;Children (lives with spouse, adult daughter and 73yo granddaughter are moving in soon.) Available Help at Discharge: Available PRN/intermittently;Family (spouse and daughter both work during the day; pt cares for 4yo granddtr during the day) Type of Home: House Home Access: Stairs to enter (a "couple" steps to get from front of house to the driveway) Technical brewer of Steps: a "couple" steps to get from front of house to the [place]: One level     Bathroom Shower/Tub: Tub/shower unit Shower/tub characteristics: Door Biochemist, clinical: Handicapped height (1 std toilet, 1 handicapped height)     Home Equipment: None          Prior Functioning/Environment Level of Independence: Needs  assistance    ADL's / Homemaking Assistance Needed: Pt increasingly required some assistance for cooking, cleaning,etc. Indep with all ADL due to decreased activity tolerance            OT Problem List: Decreased activity tolerance;Decreased strength   OT Treatment/Interventions:      OT Goals(Current goals can be found in the care plan section)    OT Frequency:     Barriers to D/C:            Co-evaluation              End of Session    Activity Tolerance: Patient tolerated treatment well Patient left: in bed;with call bell/phone within reach   Time: 1140-1225 OT Time Calculation (min): 45 min Charges:  OT General Charges $OT Visit: 1 Procedure OT Evaluation $OT Eval Low  Complexity: 1 Procedure OT Treatments $Self Care/Home Management : 23-37 mins G-Codes:    Corky Sox, OTR/L 11/22/2015, 1:01 PM

## 2015-11-22 NOTE — Progress Notes (Signed)
[facility] [place] NAME: Jamie Keller    MR#:  [id number]  DATE OF BIRTH:  02-Jul-1965  SUBJECTIVE: Spoke with the help of Spanish translator. Patient admits of chest tightness, some shortness of breath, especially whenever she lays down, some cough, but left lower extremity and pedal edema. Appetite has improved. Patient received permanent dialysis catheter in the  Left  IJ . Feels comfortable overall. Management is working to establish dialysis facility.   CHIEF COMPLAINT:   Chief Complaint  Patient presents with  . Abdominal Pain  . Respiratory Distress     REVIEW OF SYSTEMS:    Review of Systems  Constitutional: Negative for chills and fever.  HENT: Negative for sore throat.   Eyes: Negative for blurred vision, double vision and pain.  Respiratory: Negative for cough, hemoptysis, shortness of breath and wheezing.   Cardiovascular: Negative for chest pain, palpitations, orthopnea and leg swelling.  Gastrointestinal: Negative for abdominal pain, constipation, diarrhea, heartburn, nausea and vomiting.  Genitourinary: Negative for dysuria and hematuria.  Musculoskeletal: Negative for back pain and joint pain.  Skin: Negative for rash.  Neurological: Negative for dizziness, sensory change, speech change, focal weakness and headaches.  Endo/Heme/Allergies: Does not bruise/bleed easily.  Psychiatric/Behavioral: Negative for depression. The patient is not nervous/anxious.     DRUG ALLERGIES:   Allergies  Allergen Reactions  . No Known Allergies     VITALS:  Blood pressure 122/63, pulse 91, temperature 98.6 F (37 C), temperature source Oral, resp. rate 18, height '5\' 5"'$  (1.651 m), weight 58.3 kg (128 lb 8.5 oz), last menstrual period 11/20/1985, SpO2 99 %.  PHYSICAL EXAMINATION:   Physical Exam  GENERAL:  50 y.o.-year-old patient lying in the bed with no acute distress.  EYES: Pupils equal, round, reactive to  light and accommodation. No scleral icterus. Extraocular muscles intact.  HEENT: Head atraumatic, normocephalic. Oropharynx and nasopharynx clear.  NECK:  Supple, no jugular venous distention. No thyroid enlargement, no tenderness.  LUNGS: Markedly Diminished breath sounds bilaterally posteriorly, no wheezing, rales, rhonchi or crackles. Intermittent use of accessory muscles of respiration, mostly with movement, speech.  CARDIOVASCULAR: S1, S2 normal. No murmurs, rubs, or gallops.  ABDOMEN: Soft, nondistended. Bowel sounds present. No organomegaly or mass. Mild epigastric area tenderness EXTREMITIES: No cyanosis, clubbing , 1-2+ lower extremity and pedal edema b/l.    NEUROLOGIC: Cranial nerves II through XII are intact. No focal Motor or sensory deficits b/l.   PSYCHIATRIC: The patient is alert and oriented x 3.  SKIN: No obvious rash, lesion, or ulcer.   LABORATORY PANEL:   CBC  Recent Labs Lab 11/22/15 0557  WBC 6.7  HGB 7.2*  HCT 21.0*  PLT 117*   ------------------------------------------------------------------------------------------------------------------ Chemistries   Recent Labs Lab 11/22/15 0557  NA 136  K 4.7  CL 104  CO2 22  GLUCOSE 165*  BUN 62*  CREATININE 6.49*  CALCIUM 9.2   ------------------------------------------------------------------------------------------------------------------  Cardiac Enzymes No results for input(s): TROPONINI in the last 168 hours. ------------------------------------------------------------------------------------------------------------------  RADIOLOGY:  Dg Chest 2 View  Result Date: 11/21/2015 CLINICAL DATA:  50 year old with end-stage renal disease on hemodialysis presenting with generalized weakness, cough and shortness of breath. Follow-up bilateral pleural effusions. EXAM: CHEST  2 VIEW COMPARISON:  11/20/2015, 11/13/2015 and earlier, including CT chest 09/02/2015 and earlier. FINDINGS: Cardiac silhouette markedly  enlarged. Pulmonary venous hypertension and mild diffuse interstitial pulmonary edema, increased since yesterday. Bilateral pleural effusions, left greater than right, with associated consolidation  the lower lobes, slightly increased since yesterday. Left jugular dialysis catheter tips in the right atrium, unchanged. Right jugular Port-A-Cath tip at or near the cavoatrial junction, unchanged. Visualized bony thorax intact. Old compression fracture of the upper endplate of U38, unchanged. IMPRESSION: Worsening CHF and/or fluid overload, with increased interstitial pulmonary edema and enlarging bilateral pleural effusions since yesterday. Worsening associated passive atelectasis and/or pneumonia involving the lower lobes, left greater than right. Electronically Signed   By: Evangeline Dakin M.D.   On: 11/21/2015 15:59   Nm Pulmonary Perf And Vent  Result Date: 11/21/2015 CLINICAL DATA:  Subacute onset of dyspnea and shortness of breath. Initial encounter. EXAM: NUCLEAR MEDICINE VENTILATION - PERFUSION LUNG SCAN TECHNIQUE: Ventilation images were obtained in multiple projections using inhaled aerosol Tc-59mDTPA. Perfusion images were obtained in multiple projections after intravenous injection of Tc-919mAA. RADIOPHARMACEUTICALS:  29.814 mCi Technetium-9945mPA aerosol inhalation and 2.565 mCi Technetium-19m39m IV COMPARISON:  Chest radiograph performed earlier today at 3:44 p.m. FINDINGS: Ventilation: Diffuse heterogeneity is noted with regard to ventilation within both lungs, with numerous small peripheral defects seen bilaterally. No large segmental ventilation defects are identified. Perfusion: There is minimal peripheral heterogeneity of perfusion, without a focal defect to suggest pulmonary embolus. Perfusion of both lungs is much more normal in appearance than corresponding ventilation. IMPRESSION: Low probability for pulmonary embolus. Scattered small peripheral ventilation defects, without perfusion  abnormality. Electronically Signed   By: JeffGarald Balding.   On: 11/21/2015 19:59     ASSESSMENT AND PLAN:   * Acute kidney injury over CKD3;Patient started to feel better, urine output only 250 cc with a past 24 hours. Patient will continue dialysis, nephrology  is planning to  set up outpatient acute dialysis. Patient is aware of this. Discussed this with help of Spanish translator. Likely progression of myeloma kidney. Scheduled for 24-hour creatinine clearance started yesterday. Possible discharge early next week. Discussed with Dr. KOLLJuleen ChinaMid abdominal pain, hunger pain, thought to be due to Mild acute pancreatitis Improved. Pain medications as needed Tolerating food. H. pylori testing is pending  * Anemia of chronic disease is stable  * Multiple myeloma Discussed with Dr. FinnGrayland Ormondld treatment at this point due to renal failure. Patient was on Velcade. Discussed with the help of translator, answered her questions. Discussed with  nephrologist also.   *Hypertension: Does have tachycardia:continue Verapamil 80 mg daily,add  ,verapamil 40 mg in the Evening. Blood pressure readings have improved significantly, but not heart rate, patient remains in sinus tachycardia. V/Q SCAN is low probability for pulmonary embolism, discussed this patient  * Sinus tachycardia of unclear etiology, patient is being continued on verapamil with no significant improvement, VQ scan is low probability, TSH normal at 1.9, patient is tachycardic, likely due to fluid overload, discussed this patient and nephrologist, supportive therapy with verapamil    All the records are reviewed and case discussed with Care Management/Social Workerr. Management plans discussed with the patient, family and they are in agreement.  CODE STATUS: FULL CODE  DVT Prophylaxis: SCDs  TOTAL TIME TAKING CARE OF THIS PATIENT: 35  minutes. discussed via SpanWoodbineerpreter help, discussed with nephrologist  POSSIBLE D/C IN  2-3 DAYS, DEPENDING ON CLINICAL CONDITION.  VAICTheodoro Grist on 11/22/2015 at 1:52 PM  Between 7am to 6pm - Pager - [phone]  After 6pm go to [email or web address] - password [email or web address]  Office  [phone]: Primary care physician; AdriElyse Jarvis  Note: This  dictation was prepared with Dragon dictation along with smaller phrase technology. Any transcriptional errors that result from this process are unintentional.  

## 2015-11-23 LAB — HEMOGLOBIN AND HEMATOCRIT, BLOOD
HCT: 21.5 % — ABNORMAL LOW (ref 35.0–47.0)
Hemoglobin: 7.3 g/dL — ABNORMAL LOW (ref 12.0–16.0)

## 2015-11-23 LAB — GLUCOSE, CAPILLARY
GLUCOSE-CAPILLARY: 118 mg/dL — AB (ref 65–99)
GLUCOSE-CAPILLARY: 191 mg/dL — AB (ref 65–99)
Glucose-Capillary: 150 mg/dL — ABNORMAL HIGH (ref 65–99)
Glucose-Capillary: 151 mg/dL — ABNORMAL HIGH (ref 65–99)
Glucose-Capillary: 219 mg/dL — ABNORMAL HIGH (ref 65–99)

## 2015-11-23 LAB — PREPARE RBC (CROSSMATCH)

## 2015-11-23 MED ORDER — SODIUM CHLORIDE 0.9 % IV SOLN: Freq: Once | INTRAVENOUS | Status: DC

## 2015-11-23 NOTE — Progress Notes (Signed)
HD COMPLETED  

## 2015-11-23 NOTE — Progress Notes (Signed)
PRE DIALYSIS ASSESSMENT 

## 2015-11-23 NOTE — Progress Notes (Signed)
POST DIALYSIS ASSESSMENT 

## 2015-11-23 NOTE — Progress Notes (Signed)
[facility] at [place] NAME: Jamie Keller    MR#:  [id number]  DATE OF BIRTH:  [birth year]  SUBJECTIVE: Spoke with the help of Spanish translator. Patient admits of chest tightness, some shortness of breath, especially whenever she lays down, some cough, but left lower extremity and pedal edema. Appetite has improved. Patient received permanent dialysis catheter in the  Left  IJ . Feels comfortable overall, but weak, tired, anxious. Management is working to establish dialysis facility. Progressively anemic and weak, 4 packed red blood cell transfusion today with hemodialysis   CHIEF COMPLAINT:   Chief Complaint  Patient presents with  . Abdominal Pain  . Respiratory Distress     REVIEW OF SYSTEMS:    Review of Systems  Constitutional: Negative for chills and fever.  HENT: Negative for sore throat.   Eyes: Negative for blurred vision, double vision and pain.  Respiratory: Negative for cough, hemoptysis, shortness of breath and wheezing.   Cardiovascular: Negative for chest pain, palpitations, orthopnea and leg swelling.  Gastrointestinal: Negative for abdominal pain, constipation, diarrhea, heartburn, nausea and vomiting.  Genitourinary: Negative for dysuria and hematuria.  Musculoskeletal: Negative for back pain and joint pain.  Skin: Negative for rash.  Neurological: Negative for dizziness, sensory change, speech change, focal weakness and headaches.  Endo/Heme/Allergies: Does not bruise/bleed easily.  Psychiatric/Behavioral: Negative for depression. The patient is not nervous/anxious.     DRUG ALLERGIES:   Allergies  Allergen Reactions  . No Known Allergies     VITALS:  Blood pressure 128/66, pulse 98, temperature 98.6 F (37 C), temperature source Oral, resp. rate 20, height 5' 5"  (1.651 m), weight 57.6 kg (126 lb 15.8 oz), last menstrual period 11/20/1985, SpO2 98 %.  PHYSICAL EXAMINATION:   Physical  Exam  GENERAL:  50 y.o.-year-old patient lying in the bed with no acute distress.  EYES: Pupils equal, round, reactive to light and accommodation. No scleral icterus. Extraocular muscles intact.  HEENT: Head atraumatic, normocephalic. Oropharynx and nasopharynx clear.  NECK:  Supple, no jugular venous distention. No thyroid enlargement, no tenderness.  LUNGS: Markedly Diminished breath sounds bilaterally posteriorly, no wheezing, rales, rhonchi or crackles. Intermittent use of accessory muscles of respiration, mostly with movement, speech.  CARDIOVASCULAR: S1, S2 normal. No murmurs, rubs, or gallops.  ABDOMEN: Soft, nondistended. Bowel sounds present. No organomegaly or mass. Mild epigastric area tenderness EXTREMITIES: No cyanosis, clubbing , 1-2+ lower extremity and pedal edema b/l.    NEUROLOGIC: Cranial nerves II through XII are intact. No focal Motor or sensory deficits b/l.   PSYCHIATRIC: The patient is alert and oriented x 3.  SKIN: No obvious rash, lesion, or ulcer.   LABORATORY PANEL:   CBC  Recent Labs Lab 11/22/15 0557 11/23/15 1123  WBC 6.7  --   HGB 7.2* 7.3*  HCT 21.0* 21.5*  PLT 117*  --    ------------------------------------------------------------------------------------------------------------------ Chemistries   Recent Labs Lab 11/22/15 0557  NA 136  K 4.7  CL 104  CO2 22  GLUCOSE 165*  BUN 62*  CREATININE 6.49*  CALCIUM 9.2   ------------------------------------------------------------------------------------------------------------------  Cardiac Enzymes No results for input(s): TROPONINI in the last 168 hours. ------------------------------------------------------------------------------------------------------------------  RADIOLOGY:  Dg Chest 2 View  Result Date: 11/21/2015 CLINICAL DATA:  50 year old with end-stage renal disease on hemodialysis presenting with generalized weakness, cough and shortness of breath. Follow-up bilateral pleural  effusions. EXAM: CHEST  2 VIEW COMPARISON:  11/20/2015, 11/13/2015 and earlier, including CT chest 09/02/2015 and earlier.  FINDINGS: Cardiac silhouette markedly enlarged. Pulmonary venous hypertension and mild diffuse interstitial pulmonary edema, increased since yesterday. Bilateral pleural effusions, left greater than right, with associated consolidation the lower lobes, slightly increased since yesterday. Left jugular dialysis catheter tips in the right atrium, unchanged. Right jugular Port-A-Cath tip at or near the cavoatrial junction, unchanged. Visualized bony thorax intact. Old compression fracture of the upper endplate of T01, unchanged. IMPRESSION: Worsening CHF and/or fluid overload, with increased interstitial pulmonary edema and enlarging bilateral pleural effusions since yesterday. Worsening associated passive atelectasis and/or pneumonia involving the lower lobes, left greater than right. Electronically Signed   By: Evangeline Dakin M.D.   On: 11/21/2015 15:59   Nm Pulmonary Perf And Vent  Result Date: 11/21/2015 CLINICAL DATA:  Subacute onset of dyspnea and shortness of breath. Initial encounter. EXAM: NUCLEAR MEDICINE VENTILATION - PERFUSION LUNG SCAN TECHNIQUE: Ventilation images were obtained in multiple projections using inhaled aerosol Tc-63mDTPA. Perfusion images were obtained in multiple projections after intravenous injection of Tc-971mAA. RADIOPHARMACEUTICALS:  29.814 mCi Technetium-9977mPA aerosol inhalation and 2.565 mCi Technetium-54m21m IV COMPARISON:  Chest radiograph performed earlier today at 3:44 p.m. FINDINGS: Ventilation: Diffuse heterogeneity is noted with regard to ventilation within both lungs, with numerous small peripheral defects seen bilaterally. No large segmental ventilation defects are identified. Perfusion: There is minimal peripheral heterogeneity of perfusion, without a focal defect to suggest pulmonary embolus. Perfusion of both lungs is much more normal in  appearance than corresponding ventilation. IMPRESSION: Low probability for pulmonary embolus. Scattered small peripheral ventilation defects, without perfusion abnormality. Electronically Signed   By: JeffGarald Balding.   On: 11/21/2015 19:59     ASSESSMENT AND PLAN:   * Acute kidney injury over CKD3;Patient is somewhat better, urine output only 120 cc with a past 24 hours. Patient will continue dialysis, nephrology  is planning to  set up outpatient acute dialysis. Patient is aware of this. Discussed this with help of Spanish translator. Likely progression of myeloma kidney.  24-hour creatinine clearance was 7. Possible discharge early next week. Discussed with Dr. KOLLJuleen Chinaay  * Mid abdominal pain, hunger pain, thought to be due to Mild acute pancreatitis Improved. Pain medications as needed Tolerating food. H. pylori testing is pending  * Anemia of chronic disease is stable  * Multiple myeloma Discussed with Dr. FinnGrayland Ormondld treatment at this point due to renal failure. Patient was on Velcade. Discussed with the help of translator, answered her questions. Discussed with  nephrologist also.   *Hypertension: Does have tachycardia:continue Verapamil 80 mg daily,add  ,verapamil 40 mg in the Evening. Blood pressure readings have improved significantly, as well as heart rate. V/Q SCAN was low probability for pulmonary embolism.  * Sinus tachycardia , likely due to fluid overload, improved with verapamil, VQ scan is low probability, TSH normal at 1.9.  *Anemia of renal failure and of multiple myeloma, worsening, transfuse patient with one packed red blood cell unit today with dialysis due to fatigue, weakness, follow hemoglobin level tomorrow morning    All the records are reviewed and case discussed with Care Management/Social Workerr. Management plans discussed with the patient, family and they are in agreement.  CODE STATUS: FULL CODE  DVT Prophylaxis: SCDs  TOTAL TIME TAKING  CARE OF THIS PATIENT: 35  minutes. discussed via SpanHastingserpreter help, discussed with nephrologist  POSSIBLE D/C IN 2-3 DAYS, DEPENDING ON CLINICAL CONDITION.  VAICTheodoro Grist on 11/23/2015 at 1:44 PM  Between 7am to 6pm - Pager - [phone]  After 6pm go to [email or web address] - password [email or web address] [place] [facility]  Office  [phone]  CC: Primary care physician; [name], MD  Note: This dictation was prepared with Dragon dictation along with smaller phrase technology. Any transcriptional errors that result from this process are unintentional.

## 2015-11-23 NOTE — Progress Notes (Signed)
HD STARTED  

## 2015-11-23 NOTE — Progress Notes (Signed)
[facility]  ROUNDING NOTE   Subjective:   History taken with assistance with Spanish interpreter.   Creatinine clearance for 24 hour urine collection is 7.   Scheduled for dialysis later today.   Objective:  Vital signs in last 24 hours:  Temp:  [98.4 F (36.9 C)-98.6 F (37 C)] 98.4 F (36.9 C) (09/17 0505) Pulse Rate:  [89-101] 101 (09/17 0505) Resp:  [18] 18 (09/16 2025) BP: (122-130)/(51-63) 130/58 (09/17 0505) SpO2:  [96 %-99 %] 96 % (09/17 0505) Weight:  [57.6 kg (126 lb 15.8 oz)] 57.6 kg (126 lb 15.8 oz) (09/17 0500)  Weight change: -0.7 kg (-1 lb 8.7 oz) Filed Weights   11/21/15 0451 11/22/15 0441 11/23/15 0500  Weight: 58.5 kg (128 lb 15.5 oz) 58.3 kg (128 lb 8.5 oz) 57.6 kg (126 lb 15.8 oz)    Intake/Output: I/O last 3 completed shifts: In: 120 [P.O.:120] Out: -    Intake/Output this shift:  Total I/O In: 240 [P.O.:240] Out: -   Physical Exam: General: No acute distress  Head: Normocephalic, atraumatic. Moist oral mucosal membranes  Eyes: Anicteric  Neck: Supple, trachea midline  Lungs:  Clear to auscultation, normal effort  Heart: S1S2 no rubs  Abdomen:  Soft, mid upper abdominal tenderness noted  Extremities: No peripheral edema.  Neurologic: Nonfocal, moving all four extremities  Skin: No lesions  Access: LIJ permcath 9/13 Dr. Lucky Cowboy    Basic Metabolic Panel:  Recent Labs Lab 11/18/15 0515 11/19/15 0449 11/20/15 0619 11/21/15 0415 11/22/15 0557  NA 138 141 138 139  139 136  K 4.2 4.3 4.6 4.8  4.8 4.7  CL 97* 102 103 103  103 104  CO2 _0 GLUCOSE 124* 128* 114* 138*  138* 165*  BUN 45* 55* 41* 47*  48* 62*  CREATININE 5.82* 6.54* 4.93* 5.76*  5.71* 6.49*  CALCIUM 7.9* 8.5* 8.4* 9.0  9.0 9.2  PHOS  --   --  4.2 4.4 4.8*    Liver Function Tests:  Recent Labs Lab 11/20/15 0619 11/21/15 0415 11/22/15 0557  ALBUMIN 2.6* 2.7* 2.7*   No results for input(s): LIPASE, AMYLASE in the last 168  hours. No results for input(s): AMMONIA in the last 168 hours.  CBC:  Recent Labs Lab 11/17/15 0430 11/20/15 0619 11/21/15 0415 11/22/15 0557  WBC 5.1 5.7 6.6 6.7  NEUTROABS 3.5  --   --   --   HGB 8.3* 7.6* 7.3* 7.2*  HCT 24.4* 22.5* 21.1* 21.0*  MCV 88.5 90.8 89.8 91.1  PLT 114* 110* 113* 117*    Cardiac Enzymes: No results for input(s): CKTOTAL, CKMB, CKMBINDEX, TROPONINI in the last 168 hours.  BNP: Invalid input(s): POCBNP  CBG:  Recent Labs Lab 11/22/15 0740 11/22/15 1149 11/22/15 1703 11/22/15 2114 11/23/15 0732  GLUCAP 150* 162* 176* 136* 118*    Microbiology: Results for orders placed or performed during the hospital encounter of 11/13/15  MRSA PCR Screening     Status: None   Collection Time: 11/14/15  3:11 AM  Result Value Ref Range Status   MRSA by PCR NEGATIVE NEGATIVE Final    Comment:        The GeneXpert MRSA Assay (FDA approved for NASAL specimens only), is one component of a comprehensive MRSA colonization surveillance program. It is not intended to diagnose MRSA infection nor to guide or monitor treatment for MRSA infections.     Coagulation Studies: No results for input(s): LABPROT, INR in  the last 72 hours.  Urinalysis: No results for input(s): COLORURINE, LABSPEC, PHURINE, GLUCOSEU, HGBUR, BILIRUBINUR, KETONESUR, PROTEINUR, UROBILINOGEN, NITRITE, LEUKOCYTESUR in the last 72 hours.  Invalid input(s): APPERANCEUR    Imaging: Dg Chest 2 View  Result Date: 11/21/2015 CLINICAL DATA:  50 year old with end-stage renal disease on hemodialysis presenting with generalized weakness, cough and shortness of breath. Follow-up bilateral pleural effusions. EXAM: CHEST  2 VIEW COMPARISON:  11/20/2015, 11/13/2015 and earlier, including CT chest 09/02/2015 and earlier. FINDINGS: Cardiac silhouette markedly enlarged. Pulmonary venous hypertension and mild diffuse interstitial pulmonary edema, increased since yesterday. Bilateral pleural effusions,  left greater than right, with associated consolidation the lower lobes, slightly increased since yesterday. Left jugular dialysis catheter tips in the right atrium, unchanged. Right jugular Port-A-Cath tip at or near the cavoatrial junction, unchanged. Visualized bony thorax intact. Old compression fracture of the upper endplate of G29, unchanged. IMPRESSION: Worsening CHF and/or fluid overload, with increased interstitial pulmonary edema and enlarging bilateral pleural effusions since yesterday. Worsening associated passive atelectasis and/or pneumonia involving the lower lobes, left greater than right. Electronically Signed   By: Evangeline Dakin M.D.   On: 11/21/2015 15:59   Nm Pulmonary Perf And Vent  Result Date: 11/21/2015 CLINICAL DATA:  Subacute onset of dyspnea and shortness of breath. Initial encounter. EXAM: NUCLEAR MEDICINE VENTILATION - PERFUSION LUNG SCAN TECHNIQUE: Ventilation images were obtained in multiple projections using inhaled aerosol Tc-91mDTPA. Perfusion images were obtained in multiple projections after intravenous injection of Tc-944mAA. RADIOPHARMACEUTICALS:  29.814 mCi Technetium-995mPA aerosol inhalation and 2.565 mCi Technetium-75m15m IV COMPARISON:  Chest radiograph performed earlier today at 3:44 p.m. FINDINGS: Ventilation: Diffuse heterogeneity is noted with regard to ventilation within both lungs, with numerous small peripheral defects seen bilaterally. No large segmental ventilation defects are identified. Perfusion: There is minimal peripheral heterogeneity of perfusion, without a focal defect to suggest pulmonary embolus. Perfusion of both lungs is much more normal in appearance than corresponding ventilation. IMPRESSION: Low probability for pulmonary embolus. Scattered small peripheral ventilation defects, without perfusion abnormality. Electronically Signed   By: JeffGarald Balding.   On: 11/21/2015 19:59     Medications:     . gabapentin  100 mg Oral TID  .  heparin subcutaneous  5,000 Units Subcutaneous Q12H  . insulin aspart  0-5 Units Subcutaneous QHS  . insulin aspart  0-9 Units Subcutaneous TID WC  . iopamidol  30 mL Oral Once  . verapamil  40 mg Oral Q24H  . verapamil  80 mg Oral Daily   sodium chloride, acetaminophen, heparin lock flush, heparin lock flush, oxyCODONE, sodium chloride flush, sodium chloride flush  Assessment/ Plan:  50 y65.hispanic female with diabetes mellitus type 2, hypertension, multiple myeloma on chemotherapy, osteoarthritis, chronic kidney disease stage III   1. Acute renal failure on CKD stage III baseline Cr 1.4 from 8/28/217.  Concern for progression of myeloma kidney. Nonoliguric urine output.  Tunneled catheter placed.  - Dialysis later today, orders prepared. Then will start a TTS schedule.  - will need intermittent dialysis until new baseline renal function established. Set up outpatient acute dialysis at [facility] have been sent.  - 24 hour for creatinine clearance 7.   2. 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 With thrombocytopenia. PRBC transfusion on 9/7. Hemoglobin 7.2 - scheduled for PRBC transfusion today with dialysis as per Primary - Oncology: Dr. FinnGrayland Ormond Multiple Myeloma: on velcade and zometa - being held due to renal failure. Held steroids due  to hyperglycemia.   4. Hypertension: at goal.  - verapamil   LOS: [place], [place] 9/17/201710:54 AM

## 2015-11-24 LAB — H PYLORI, IGM, IGG, IGA AB: H. Pylogi, Iga Abs: <9 units (ref 0.0–8.9)

## 2015-11-24 LAB — GLUCOSE, CAPILLARY
GLUCOSE-CAPILLARY: 198 mg/dL — AB (ref 65–99)
GLUCOSE-CAPILLARY: 143 mg/dL — AB (ref 65–99)
GLUCOSE-CAPILLARY: 164 mg/dL — AB (ref 65–99)
Glucose-Capillary: 127 mg/dL — ABNORMAL HIGH (ref 65–99)

## 2015-11-24 LAB — TYPE AND SCREEN
ABO/RH(D): B POS
Antibody Screen: NEGATIVE
Unit division: 0

## 2015-11-24 LAB — HEMOGLOBIN: Hemoglobin: 8.2 g/dL — ABNORMAL LOW (ref 12.0–16.0)

## 2015-11-24 MED ORDER — GABAPENTIN 100 MG PO CAPS
200.0000 mg | ORAL_CAPSULE | Freq: Three times a day (TID) | ORAL | Status: DC
  Administered 2015-11-24 – 2015-11-28 (×11): 200 mg via ORAL
  Filled 2015-11-24 (×13): qty 2

## 2015-11-24 MED ORDER — MENTHOL 3 MG MT LOZG
1.0000 | LOZENGE | OROMUCOSAL | Status: DC | PRN
  Filled 2015-11-24: qty 9

## 2015-11-24 MED ORDER — FERROUS SULFATE 325 (65 FE) MG PO TABS
325.0000 mg | ORAL_TABLET | Freq: Two times a day (BID) | ORAL | Status: DC
  Administered 2015-11-24 – 2015-11-28 (×8): 325 mg via ORAL
  Filled 2015-11-24 (×8): qty 1

## 2015-11-24 MED ORDER — RENA-VITE PO TABS
1.0000 | ORAL_TABLET | Freq: Every day | ORAL | Status: DC
  Administered 2015-11-24 – 2015-11-27 (×4): 1 via ORAL
  Filled 2015-11-24 (×4): qty 1

## 2015-11-24 NOTE — Progress Notes (Signed)
[facility]  ROUNDING NOTE   Subjective:   Conversation with patient is through [place] interpreter.  Overall doing well Asking about going home Reports left hip pain but tolerable No shortness of breath No nausea or vomiting   Objective:  Vital signs in last 24 hours:  Temp:  [98.5 F (36.9 C)-98.8 F (37.1 C)] 98.6 F (37 C) (09/18 1147) Pulse Rate:  [84-114] 84 (09/18 1147) Resp:  [18-25] 18 (09/18 1147) BP: (118-140)/(53-65) 118/54 (09/18 1147) SpO2:  [96 %-98 %] 96 % (09/18 1147) Weight:  [54.3 kg (119 lb 11.4 oz)-55.5 kg (122 lb 5.7 oz)] 54.3 kg (119 lb 11.4 oz) (09/18 0508)  Weight change: -0.4 kg (-14.1 oz) Filed Weights   11/23/15 1400 11/23/15 1700 11/24/15 0508  Weight: 57.2 kg (126 lb 1.7 oz) 55.5 kg (122 lb 5.7 oz) 54.3 kg (119 lb 11.4 oz)    Intake/Output: I/O last 3 completed shifts: In: 260 [P.O.:240; I.V.:20] Out: 1000 [Other:1000]   Intake/Output this shift:  No intake/output data recorded.  Physical Exam: General: No acute distress  Head: Normocephalic, atraumatic. Moist oral mucosal membranes  Eyes: Anicteric  Neck: Supple, trachea midline  Lungs:  Clear to auscultation, normal effort  Heart: S1S2 no rubs  Abdomen:  Soft, mid upper abdominal tenderness noted  Extremities: Trace to 1+ peripheral edema.  Neurologic: Nonfocal, moving all four extremities  Skin: No lesions  Access: LIJ permcath 9/13 Dr. Lucky Cowboy    Basic Metabolic Panel:  Recent Labs Lab 11/18/15 0515 11/19/15 0449 11/20/15 0619 11/21/15 0415 11/22/15 0557  NA 138 141 138 139  139 136  K 4.2 4.3 4.6 4.8  4.8 4.7  CL 97* 102 103 103  103 104  CO2 29 28 25 25  25 22   GLUCOSE 124* 128* 114* 138*  138* 165*  BUN 45* 55* 41* 47*  48* 62*  CREATININE 5.82* 6.54* 4.93* 5.76*  5.71* 6.49*  CALCIUM 7.9* 8.5* 8.4* 9.0  9.0 9.2  PHOS  --   --  4.2 4.4 4.8*    Liver Function Tests:  Recent Labs Lab 11/20/15 0619 11/21/15 0415 11/22/15 0557  ALBUMIN  2.6* 2.7* 2.7*   No results for input(s): LIPASE, AMYLASE in the last 168 hours. No results for input(s): AMMONIA in the last 168 hours.  CBC:  Recent Labs Lab 11/20/15 0619 11/21/15 0415 11/22/15 0557 11/23/15 1123 11/24/15 0503  WBC 5.7 6.6 6.7  --   --   HGB 7.6* 7.3* 7.2* 7.3* 8.2*  HCT 22.5* 21.1* 21.0* 21.5*  --   MCV 90.8 89.8 91.1  --   --   PLT 110* 113* 117*  --   --     Cardiac Enzymes: No results for input(s): CKTOTAL, CKMB, CKMBINDEX, TROPONINI in the last 168 hours.  BNP: Invalid input(s): POCBNP  CBG:  Recent Labs Lab 11/23/15 1755 11/23/15 2128 11/23/15 2300 11/24/15 0728 11/24/15 1125  GLUCAP 151* 219* 191* 127* 198*    Microbiology: Results for orders placed or performed during the hospital encounter of 11/13/15  MRSA PCR Screening     Status: None   Collection Time: 11/14/15  3:11 AM  Result Value Ref Range Status   MRSA by PCR NEGATIVE NEGATIVE Final    Comment:        The GeneXpert MRSA Assay (FDA approved for NASAL specimens only), is one component of a comprehensive MRSA colonization surveillance program. It is not intended to diagnose MRSA infection nor to guide or monitor treatment for  MRSA infections.     Coagulation Studies: No results for input(s): LABPROT, INR in the last 72 hours.  Urinalysis: No results for input(s): COLORURINE, LABSPEC, PHURINE, GLUCOSEU, HGBUR, BILIRUBINUR, KETONESUR, PROTEINUR, UROBILINOGEN, NITRITE, LEUKOCYTESUR in the last 72 hours.  Invalid input(s): APPERANCEUR    Imaging: No results found.   Medications:     . sodium chloride   Intravenous Once  . ferrous sulfate  325 mg Oral BID WC  . gabapentin  200 mg Oral TID  . heparin subcutaneous  5,000 Units Subcutaneous Q12H  . insulin aspart  0-5 Units Subcutaneous QHS  . insulin aspart  0-9 Units Subcutaneous TID WC  . iopamidol  30 mL Oral Once  . multivitamin  1 tablet Oral QHS  . verapamil  40 mg Oral Q24H  . verapamil  80 mg Oral  Daily   sodium chloride, acetaminophen, heparin lock flush, heparin lock flush, menthol-cetylpyridinium, oxyCODONE, sodium chloride flush, sodium chloride flush  Assessment/ Plan:  50 y.o.hispanic female with diabetes mellitus type 2, hypertension, multiple myeloma on chemotherapy, osteoarthritis, chronic kidney disease stage III   1. Acute renal failure on CKD stage III baseline Cr 1.4 from 8/28/217.  Concern for progression of myeloma kidney. Nonoliguric urine output. - 24 hour for creatinine clearance 7.  Tunneled catheter placed.  - Dialysis  TTS schedule.  - will need intermittent dialysis until new baseline renal function established.  Set up outpatient acute dialysis at [facility]. Orders have been sent by Dr Juleen China - awaiting placement   2. 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 With thrombocytopenia. PRBC transfusion on 9/7. Hemoglobin 8.2 - Oncology: Dr. Grayland Ormond  3. Multiple Myeloma: on velcade and zometa - being held due to renal failure. Held steroids due to hyperglycemia.      LOS: 10 Jamie Keller 9/18/20172:08 PM

## 2015-11-24 NOTE — Progress Notes (Signed)
[facility] [place] NAME: Jamie Keller    MR#:  [id number]  DATE OF BIRTH:  10/26/1965  SUBJECTIVE: Spoke with the help of Spanish translator. Patient admits of chest tightness, some shortness of breath, especially whenever she lays down, some cough, but left lower extremity and pedal edema. Appetite has improved. Patient received permanent dialysis catheter in the  Left  IJ . Feels comfortable overall, but weak, tired, anxious. Management is working to establish dialysis facility. Progressively anemic and weak, 4 packed red blood cell transfusion today with hemodialysis   CHIEF COMPLAINT:   Chief Complaint  Patient presents with  . Abdominal Pain  . Respiratory Distress   No complains now. Had some tingling sensation in both feet.  REVIEW OF SYSTEMS:    Review of Systems  Constitutional: Negative for chills and fever.  HENT: Negative for sore throat.   Eyes: Negative for blurred vision, double vision and pain.  Respiratory: Negative for cough, hemoptysis, shortness of breath and wheezing.   Cardiovascular: Negative for chest pain, palpitations, orthopnea and leg swelling.  Gastrointestinal: Negative for abdominal pain, constipation, diarrhea, heartburn, nausea and vomiting.  Genitourinary: Negative for dysuria and hematuria.  Musculoskeletal: Negative for back pain and joint pain.  Skin: Negative for rash.  Neurological: Negative for dizziness, sensory change, speech change, focal weakness and headaches.  Endo/Heme/Allergies: Does not bruise/bleed easily.  Psychiatric/Behavioral: Negative for depression. The patient is not nervous/anxious.     DRUG ALLERGIES:   Allergies  Allergen Reactions  . No Known Allergies     VITALS:  Blood pressure (!) 118/54, pulse 84, temperature 98.6 F (37 C), temperature source Oral, resp. rate 18, height 5' 5" (1.651 m), weight 54.3 kg (119 lb 11.4 oz), last menstrual period  11/20/1985, SpO2 96 %.  PHYSICAL EXAMINATION:   Physical Exam  GENERAL:  50 y.o.-year-old patient lying in the bed with no acute distress.  EYES: Pupils equal, round, reactive to light and accommodation. No scleral icterus. Extraocular muscles intact.  HEENT: Head atraumatic, normocephalic. Oropharynx and nasopharynx clear.  NECK:  Supple, no jugular venous distention. No thyroid enlargement, no tenderness.  LUNGS: Markedly Diminished breath sounds bilaterally posteriorly, no wheezing, rales, rhonchi or crackles. Intermittent use of accessory muscles of respiration, mostly with movement, speech.  CARDIOVASCULAR: S1, S2 normal. No murmurs, rubs, or gallops.  ABDOMEN: Soft, nondistended. Bowel sounds present. No organomegaly or mass. Mild epigastric area tenderness EXTREMITIES: No cyanosis, clubbing , no lower extremity and pedal edema b/l.    NEUROLOGIC: Cranial nerves II through XII are intact. No focal Motor or sensory deficits b/l.   PSYCHIATRIC: The patient is alert and oriented x 3.  SKIN: No obvious rash, lesion, or ulcer.   LABORATORY PANEL:   CBC  Recent Labs Lab 11/22/15 0557 11/23/15 1123 11/24/15 0503  WBC 6.7  --   --   HGB 7.2* 7.3* 8.2*  HCT 21.0* 21.5*  --   PLT 117*  --   --    ------------------------------------------------------------------------------------------------------------------ Chemistries   Recent Labs Lab 11/22/15 0557  NA 136  K 4.7  CL 104  CO2 22  GLUCOSE 165*  BUN 62*  CREATININE 6.49*  CALCIUM 9.2   ------------------------------------------------------------------------------------------------------------------  Cardiac Enzymes No results for input(s): TROPONINI in the last 168 hours. ------------------------------------------------------------------------------------------------------------------  RADIOLOGY:  No results found.   ASSESSMENT AND PLAN:   * Acute kidney injury over CKD3;Patient is somewhat better,   continue  dialysis, nephrology  is  planning to  set up outpatient acute dialysis.    Discussed this with help of Spanish translator. Likely progression of myeloma kidney.  24-hour creatinine clearance was 7.    Discharge once out pt HD is arranged.  * Mid abdominal pain, hunger pain, thought to be due to Mild acute pancreatitis Improved. Pain medications as needed Tolerating food. H. pylori testing is pending  * Anemia of chronic disease is stable   Required blood transfusion due to gradual drop.   Started on renal vitamin and Iron orally.  * Multiple myeloma Discussed with Dr. Grayland Keller. Hold treatment at this point due to renal failure. Patient was on Velcade. Discussed with the help of translator, answered her questions. Discussed with  nephrologist also.   *Hypertension: Does have tachycardia:continue Verapamil 80 mg daily,add  ,verapamil 40 mg in the Evening. Blood pressure readings have improved significantly, as well as heart rate. V/Q SCAN was low probability for pulmonary embolism.  * Sinus tachycardia , likely due to fluid overload, improved with verapamil, VQ scan is low probability, TSH normal at 1.9.  *Anemia of renal failure and of multiple myeloma, worsening, transfuse patient with one packed red blood cell unit 11/23/15 with dialysis due to fatigue, weakness, follow hemoglobin level tomorrow morning    All the records are reviewed and case discussed with Care Management/Social Workerr. Management plans discussed with the patient, family and they are in agreement.  CODE STATUS: FULL CODE  DVT Prophylaxis: SCDs  TOTAL TIME TAKING CARE OF THIS PATIENT: 35  minutes. discussed via [place] interpreter help, discussed with nephrologist  POSSIBLE D/C IN 2-3 DAYS, DEPENDING ON CLINICAL CONDITION.  Jamie Keller M.D on 11/24/2015 at 2:50 PM  Between 7am to 6pm - Pager - [phone]  After 6pm go to [email or web address] - password [email or web address] [facility]  Office   [phone]  CC: Primary care physician; Jamie Jarvis, MD  Note: This dictation was prepared with Dragon dictation along with smaller phrase technology. Any transcriptional errors that result from this process are unintentional.

## 2015-11-24 NOTE — Care Management Note (Signed)
Case Management Note  Patient Details  Name: Tinlee Sessum MRN: [id number] Date of Birth: [birth year]  Subjective/Objective:      Per Dr Assunta Gambles note he sent dialysis orders to [facility] on [address].This Probation officer called [name] at [facility] on [address] this morning and was told that they had received no orders but that they would have to get approval before accepting this uninsured patient for dialysis. Jackelyn Poling will call me back when she receives a decision from management about whether or not they can accept this patient. Case management will continue to follow for discharge planning.               Action/Plan:   Expected Discharge Date:  11/16/15               Expected Discharge Plan:     In-House Referral:     Discharge planning Services     Post Acute Care Choice:    Choice offered to:     DME Arranged:    DME Agency:     HH Arranged:    HH Agency:     Status of Service:     If discussed at [employer], dates discussed:    Additional Comments:  Colleen Kotlarz A, RN 11/24/2015, 10:38 AM

## 2015-11-25 LAB — GLUCOSE, CAPILLARY
GLUCOSE-CAPILLARY: 119 mg/dL — AB (ref 65–99)
Glucose-Capillary: 203 mg/dL — ABNORMAL HIGH (ref 65–99)
Glucose-Capillary: 156 mg/dL — ABNORMAL HIGH (ref 65–99)

## 2015-11-25 MED ORDER — POLYETHYLENE GLYCOL 3350 17 G PO PACK
17.0000 g | PACK | Freq: Every day | ORAL | Status: DC
  Administered 2015-11-25 – 2015-11-27 (×3): 17 g via ORAL
  Filled 2015-11-25 (×4): qty 1

## 2015-11-25 MED ORDER — FAMOTIDINE 20 MG PO TABS
20.0000 mg | ORAL_TABLET | Freq: Two times a day (BID) | ORAL | Status: DC
  Administered 2015-11-25 – 2015-11-28 (×7): 20 mg via ORAL
  Filled 2015-11-25 (×7): qty 1

## 2015-11-25 MED ORDER — FUROSEMIDE 80 MG PO TABS
80.0000 mg | ORAL_TABLET | Freq: Every day | ORAL | Status: DC
  Administered 2015-11-25 – 2015-11-28 (×4): 80 mg via ORAL
  Filled 2015-11-25 (×4): qty 1

## 2015-11-25 MED ORDER — TUBERCULIN PPD 5 UNIT/0.1ML ID SOLN
5.0000 [IU] | Freq: Once | INTRADERMAL | Status: AC
  Administered 2015-11-25: 5 [IU] via INTRADERMAL
  Filled 2015-11-25 (×2): qty 0.1

## 2015-11-25 NOTE — Care Management Note (Signed)
Case Management Note  Patient Details  Name: Tylashia Schafer MRN: [id number] Date of Birth: Dec 02, 1965  Subjective/Objective:     Called again to [facility] on [address] ph: [phone] to inquire again about the referral by Dr Juleen China for this uninsured patient for new hemodialysis. [name] reports that she is still waiting for [facility] management to approve services for Ms Ranae Palms.            Action/Plan:   Expected Discharge Date:  11/16/15               Expected Discharge Plan:     In-House Referral:     Discharge planning Services     Post Acute Care Choice:    Choice offered to:     DME Arranged:    DME Agency:     HH Arranged:    HH Agency:     Status of Service:     If discussed at [employer], dates discussed:    Additional Comments:  Talyn Dessert A, RN 11/25/2015, 1:49 PM

## 2015-11-25 NOTE — Progress Notes (Signed)
Dailysis complete

## 2015-11-25 NOTE — Progress Notes (Signed)
Nutrition Follow-up  DOCUMENTATION CODES:   Not applicable  INTERVENTION:  -Pt out of room during visit this am.  Spanish diet education materials left at bedside.  Will follow-up as able this pm. RD available at dialysis center outpatient as well for continued education.     NUTRITION DIAGNOSIS:   Inadequate oral intake related to altered GI function as evidenced by NPO status.  Improving as tolerating po diet  GOAL:   Patient will meet greater than or equal to 90% of their needs  improving  MONITOR:   Diet advancement  REASON FOR ASSESSMENT:   Malnutrition Screening Tool    ASSESSMENT:      Planning HD as outpatient.  Medications and labs reviewed:  Diet Order:  Diet Carb Modified Fluid consistency: Thin; Room service appropriate? Yes  Skin:  Reviewed, no issues  Last BM:  9/7, noted distended abdomen  Height:   Ht Readings from Last 1 Encounters:  11/15/15 5\' 5"  (1.651 m)    Weight: noted wt decrease, dialysis continues  Wt Readings from Last 1 Encounters:  11/25/15 119 lb 14.9 oz (54.4 kg)    Ideal Body Weight:     BMI:  Body mass index is 19.96 kg/m.  Estimated Nutritional Needs:   Kcal:  1770-2000 kcals/d  Protein:  71-89 g/d  Fluid:  101ml + UOP  EDUCATION NEEDS:   No education needs identified at this time  Coty Larsh B. Zenia Resides, [place], [place] (pager) Weekend/On-Call pager [phone])

## 2015-11-25 NOTE — Progress Notes (Signed)
[facility]  ROUNDING NOTE   Subjective:   Overall doing well  Reports abdominal discomfort, constipation No shortness of breath No nausea or vomiting Tolerated dialysis well  Objective:  Vital signs in last 24 hours:  Temp:  [98.4 F (36.9 C)-99.5 F (37.5 C)] 98.4 F (36.9 C) (09/19 1229) Pulse Rate:  [96-107] 102 (09/19 1229) Resp:  [16-24] 22 (09/19 1229) BP: (114-149)/(51-68) 134/62 (09/19 1229) SpO2:  [97 %-100 %] 97 % (09/19 1229) Weight:  [54.3 kg (119 lb 11.4 oz)-54.4 kg (119 lb 14.9 oz)] 54.4 kg (119 lb 14.9 oz) (09/19 1229)  Weight change: -2.9 kg (-6 lb 6.3 oz) Filed Weights   11/25/15 0423 11/25/15 0919 11/25/15 1229  Weight: 54.3 kg (119 lb 11.4 oz) 54.4 kg (119 lb 14.9 oz) 54.4 kg (119 lb 14.9 oz)    Intake/Output: No intake/output data recorded.   Intake/Output this shift:  Total I/O In: 240 [P.O.:240] Out: 0   Physical Exam: General: No acute distress  Head: Normocephalic, atraumatic. Moist oral mucosal membranes  Eyes: Anicteric  Neck: Supple, trachea midline  Lungs:  Clear to auscultation, normal effort  Heart: S1S2 no rubs  Abdomen:  Soft, mid upper abdominal tenderness noted  Extremities: Trace to 1+ peripheral edema.  Neurologic: Nonfocal, moving all four extremities  Skin: No lesions  Access: LIJ permcath 9/13 Dr. Lucky Cowboy    Basic Metabolic Panel:  Recent Labs Lab 11/19/15 0449 11/20/15 0619 11/21/15 0415 11/22/15 0557  NA 141 138 139  139 136  K 4.3 4.6 4.8  4.8 4.7  CL 102 103 103  103 104  CO2 28 25 25  25 22   GLUCOSE 128* 114* 138*  138* 165*  BUN 55* 41* 47*  48* 62*  CREATININE 6.54* 4.93* 5.76*  5.71* 6.49*  CALCIUM 8.5* 8.4* 9.0  9.0 9.2  PHOS  --  4.2 4.4 4.8*    Liver Function Tests:  Recent Labs Lab 11/20/15 0619 11/21/15 0415 11/22/15 0557  ALBUMIN 2.6* 2.7* 2.7*   No results for input(s): LIPASE, AMYLASE in the last 168 hours. No results for input(s): AMMONIA in the last 168  hours.  CBC:  Recent Labs Lab 11/20/15 0619 11/21/15 0415 11/22/15 0557 11/23/15 1123 11/24/15 0503  WBC 5.7 6.6 6.7  --   --   HGB 7.6* 7.3* 7.2* 7.3* 8.2*  HCT 22.5* 21.1* 21.0* 21.5*  --   MCV 90.8 89.8 91.1  --   --   PLT 110* 113* 117*  --   --     Cardiac Enzymes: No results for input(s): CKTOTAL, CKMB, CKMBINDEX, TROPONINI in the last 168 hours.  BNP: Invalid input(s): POCBNP  CBG:  Recent Labs Lab 11/24/15 0728 11/24/15 1125 11/24/15 1653 11/24/15 2122 11/25/15 0731  GLUCAP 127* 198* 143* 164* 119*    Microbiology: Results for orders placed or performed during the hospital encounter of 11/13/15  MRSA PCR Screening     Status: None   Collection Time: 11/14/15  3:11 AM  Result Value Ref Range Status   MRSA by PCR NEGATIVE NEGATIVE Final    Comment:        The GeneXpert MRSA Assay (FDA approved for NASAL specimens only), is one component of a comprehensive MRSA colonization surveillance program. It is not intended to diagnose MRSA infection nor to guide or monitor treatment for MRSA infections.     Coagulation Studies: No results for input(s): LABPROT, INR in the last 72 hours.  Urinalysis: No results for input(s): COLORURINE,  LABSPEC, PHURINE, GLUCOSEU, HGBUR, BILIRUBINUR, KETONESUR, PROTEINUR, UROBILINOGEN, NITRITE, LEUKOCYTESUR in the last 72 hours.  Invalid input(s): APPERANCEUR    Imaging: No results found.   Medications:     . sodium chloride   Intravenous Once  . ferrous sulfate  325 mg Oral BID WC  . furosemide  80 mg Oral Daily  . gabapentin  200 mg Oral TID  . heparin subcutaneous  5,000 Units Subcutaneous Q12H  . insulin aspart  0-5 Units Subcutaneous QHS  . insulin aspart  0-9 Units Subcutaneous TID WC  . iopamidol  30 mL Oral Once  . multivitamin  1 tablet Oral QHS  . tuberculin  5 Units Intradermal Once  . verapamil  40 mg Oral Q24H  . verapamil  80 mg Oral Daily   sodium chloride, acetaminophen, heparin lock  flush, heparin lock flush, menthol-cetylpyridinium, oxyCODONE, sodium chloride flush, sodium chloride flush  Assessment/ Plan:  50 y.o.hispanic female with diabetes mellitus type 2, hypertension, multiple myeloma on chemotherapy, osteoarthritis, chronic kidney disease stage III   1. Acute renal failure on CKD stage III baseline Cr 1.4 from 8/28/217.  Concern for progression of myeloma kidney. Nonoliguric urine output. - 24 hour for creatinine clearance 7.  Tunneled catheter placed.  - Dialysis  TTS schedule.  - will need intermittent dialysis until new baseline renal function established.  - Set up outpatient acute dialysis at [facility]. Orders have been sent by Dr Juleen China - awaiting placement   2. 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 With thrombocytopenia. PRBC transfusion on 9/7. Hemoglobin 8.2 - Oncology: Dr. Grayland Ormond  3. Multiple Myeloma: on velcade and zometa - being held due to renal failure. Held steroids due to hyperglycemia.       LOS: [place] 9/19/20171:53 PM

## 2015-11-25 NOTE — Progress Notes (Signed)
Pre Dialysis 

## 2015-11-25 NOTE — Progress Notes (Signed)
Post dialysis 

## 2015-11-25 NOTE — Progress Notes (Signed)
Dialysis started 

## 2015-11-25 NOTE — Progress Notes (Signed)
[facility] [place] NAME: Jamie Keller    MR#:  [id number]  DATE OF BIRTH:  Mar 01, 1966  SUBJECTIVE: Spoke with the help of Spanish translator. Patient admits of chest tightness, some shortness of breath, especially whenever she lays down, some cough, but left lower extremity and pedal edema. Appetite has improved. Patient received permanent dialysis catheter in the  Left  IJ . Feels comfortable overall, but weak, tired, anxious. Management is working to establish dialysis facility. Progressively anemic and weak, s/p Blood transfusion. No complains now.   CHIEF COMPLAINT:   Chief Complaint  Patient presents with  . Abdominal Pain  . Respiratory Distress   No complains now.   REVIEW OF SYSTEMS:    Review of Systems  Constitutional: Negative for chills and fever.  HENT: Negative for sore throat.   Eyes: Negative for blurred vision, double vision and pain.  Respiratory: Negative for cough, hemoptysis, shortness of breath and wheezing.   Cardiovascular: Negative for chest pain, palpitations, orthopnea and leg swelling.  Gastrointestinal: Negative for abdominal pain, constipation, diarrhea, heartburn, nausea and vomiting.  Genitourinary: Negative for dysuria and hematuria.  Musculoskeletal: Negative for back pain and joint pain.  Skin: Negative for rash.  Neurological: Negative for dizziness, sensory change, speech change, focal weakness and headaches.  Endo/Heme/Allergies: Does not bruise/bleed easily.  Psychiatric/Behavioral: Negative for depression. The patient is not nervous/anxious.     DRUG ALLERGIES:   Allergies  Allergen Reactions  . No Known Allergies     VITALS:  Blood pressure 134/62, pulse (!) 102, temperature 98.4 F (36.9 C), temperature source Oral, resp. rate (!) 22, height _0  (1.651 m), weight 54.4 kg (119 lb 14.9 oz), last menstrual period 11/20/1985, SpO2 97 %.  PHYSICAL EXAMINATION:   Physical  Exam  GENERAL:  50 y.o.-year-old patient lying in the bed with no acute distress.  EYES: Pupils equal, round, reactive to light and accommodation. No scleral icterus. Extraocular muscles intact.  HEENT: Head atraumatic, normocephalic. Oropharynx and nasopharynx clear.  NECK:  Supple, no jugular venous distention. No thyroid enlargement, no tenderness.  LUNGS: Markedly Diminished breath sounds bilaterally posteriorly, no wheezing, rales, rhonchi or crackles. Intermittent use of accessory muscles of respiration, mostly with movement, speech.  CARDIOVASCULAR: S1, S2 normal. No murmurs, rubs, or gallops.  ABDOMEN: Soft, nondistended. Bowel sounds present. No organomegaly or mass. Mild epigastric area tenderness EXTREMITIES: No cyanosis, clubbing , no lower extremity and pedal edema b/l.    NEUROLOGIC: Cranial nerves II through XII are intact. No focal Motor or sensory deficits b/l.   PSYCHIATRIC: The patient is alert and oriented x 3.  SKIN: No obvious rash, lesion, or ulcer.   LABORATORY PANEL:   CBC  Recent Labs Lab 11/22/15 0557 11/23/15 1123 11/24/15 0503  WBC 6.7  --   --   HGB 7.2* 7.3* 8.2*  HCT 21.0* 21.5*  --   PLT 117*  --   --    ------------------------------------------------------------------------------------------------------------------ Chemistries   Recent Labs Lab 11/22/15 0557  NA 136  K 4.7  CL 104  CO2 22  GLUCOSE 165*  BUN 62*  CREATININE 6.49*  CALCIUM 9.2   ------------------------------------------------------------------------------------------------------------------  Cardiac Enzymes No results for input(s): TROPONINI in the last 168 hours. ------------------------------------------------------------------------------------------------------------------  RADIOLOGY:  No results found.   ASSESSMENT AND PLAN:   * Acute kidney injury over CKD3;Patient is somewhat better,   continue dialysis, nephrology  is planning to  set up outpatient  acute dialysis.  Discussed this with help of Spanish translator. Likely progression of myeloma kidney.  24-hour creatinine clearance was 7.    Discharge once out pt HD is arranged.   As per case manager- still no approval of that.  * Mid abdominal pain, hunger pain, thought to be due to Mild acute pancreatitis   Improved. Pain medications as needed  Tolerating food. H. pylori testing is negative.  * Anemia of chronic disease is stable   Required blood transfusion due to gradual drop.   Started on renal vitamin and Iron orally.  * Multiple myeloma Discussed with Dr. Grayland Ormond. Hold treatment at this point due to renal failure. Patient was on Velcade.  *Hypertension: Does have tachycardia:continue Verapamil 80 mg daily,add  ,verapamil 40 mg in the Evening. Blood pressure readings have improved significantly, as well as heart rate. V/Q SCAN was low probability for pulmonary embolism.  * Sinus tachycardia , likely due to fluid overload, improved with verapamil, VQ scan is low probability, TSH normal at 1.9.  *Anemia of renal failure and of multiple myeloma, worsening, transfuse patient with one packed red blood cell unit 11/23/15 with dialysis due to fatigue, weakness, follow hemoglobin level .   All the records are reviewed and case discussed with Care Management/Social Workerr. Management plans discussed with the patient, family and they are in agreement.  CODE STATUS: FULL CODE  DVT Prophylaxis: SCDs  TOTAL TIME TAKING CARE OF THIS PATIENT: 35  minutes. discussed via [place] interpreter help, discussed with nephrologist  POSSIBLE D/C IN 2-3 DAYS, DEPENDING ON CLINICAL CONDITION. Awaited Outpt HD arrangement.  Vaughan Basta M.D on 11/25/2015 at 2:38 PM  Between 7am to 6pm - Pager - [phone]  After 6pm go to [email or web address] - password [email or web address] [facility]  Office  [phone]  CC: Primary care physician; [name], MD  Note: This dictation  was prepared with Dragon dictation along with smaller phrase technology. Any transcriptional errors that result from this process are unintentional.

## 2015-11-26 LAB — GLUCOSE, CAPILLARY
GLUCOSE-CAPILLARY: 183 mg/dL — AB (ref 65–99)
Glucose-Capillary: 121 mg/dL — ABNORMAL HIGH (ref 65–99)
Glucose-Capillary: 163 mg/dL — ABNORMAL HIGH (ref 65–99)
Glucose-Capillary: 207 mg/dL — ABNORMAL HIGH (ref 65–99)

## 2015-11-26 NOTE — Progress Notes (Signed)
   11/25/15 1229  Post-Hemodialysis Assessment  Rinseback Volume (mL) 250 mL  Dialyzer Clearance Lightly streaked  Duration of HD Treatment -hour(s) 3 hour(s)  Hemodialysis Intake (mL) 500 mL  UF Total -Machine (mL) 500 mL  Net UF (mL) 0 mL  Tolerated HD Treatment Yes  Post-Hemodialysis Comments Goal met

## 2015-11-26 NOTE — Progress Notes (Signed)
[facility] at [place] NAME: Jamie Keller    MR#:  [id number]  DATE OF BIRTH:  April 23, 1965  SUBJECTIVE: Spoke with the help of Spanish translator. Patient admits of chest tightness, some shortness of breath, especially whenever she lays down, some cough, but left lower extremity and pedal edema. Appetite has improved. Patient received permanent dialysis catheter in the  Left  IJ . Feels comfortable overall, but weak, tired, anxious. Management is working to establish dialysis facility. Progressively anemic and weak, s/p Blood transfusion. No complains now.   CHIEF COMPLAINT:   Chief Complaint  Patient presents with  . Abdominal Pain  . Respiratory Distress   No complains now.  REVIEW OF SYSTEMS:    Review of Systems  Constitutional: Negative for chills and fever.  HENT: Negative for sore throat.   Eyes: Negative for blurred vision, double vision and pain.  Respiratory: Negative for cough, hemoptysis, shortness of breath and wheezing.   Cardiovascular: Negative for chest pain, palpitations, orthopnea and leg swelling.  Gastrointestinal: Negative for abdominal pain, constipation, diarrhea, heartburn, nausea and vomiting.  Genitourinary: Negative for dysuria and hematuria.  Musculoskeletal: Negative for back pain and joint pain.  Skin: Negative for rash.  Neurological: Negative for dizziness, sensory change, speech change, focal weakness and headaches.  Endo/Heme/Allergies: Does not bruise/bleed easily.  Psychiatric/Behavioral: Negative for depression. The patient is not nervous/anxious.     DRUG ALLERGIES:   Allergies  Allergen Reactions  . No Known Allergies     VITALS:  Blood pressure (!) 121/53, pulse (!) 103, temperature 99.3 F (37.4 C), temperature source Oral, resp. rate 20, height 5' 5"  (1.651 m), weight 55.6 kg (122 lb 9.2 oz), last menstrual period 11/20/1985, SpO2 97 %.  PHYSICAL EXAMINATION:   Physical  Exam  GENERAL:  49 y.o.-year-old patient lying in the bed with no acute distress.  EYES: Pupils equal, round, reactive to light and accommodation. No scleral icterus. Extraocular muscles intact.  HEENT: Head atraumatic, normocephalic. Oropharynx and nasopharynx clear.  NECK:  Supple, no jugular venous distention. No thyroid enlargement, no tenderness.  LUNGS: Markedly Diminished breath sounds bilaterally posteriorly, no wheezing, rales, rhonchi or crackles. Intermittent use of accessory muscles of respiration, mostly with movement, speech.  CARDIOVASCULAR: S1, S2 normal. No murmurs, rubs, or gallops.  ABDOMEN: Soft, nondistended. Bowel sounds present. No organomegaly or mass. Mild epigastric area tenderness EXTREMITIES: No cyanosis, clubbing , no lower extremity and pedal edema b/l.    NEUROLOGIC: Cranial nerves II through XII are intact. No focal Motor or sensory deficits b/l.   PSYCHIATRIC: The patient is alert and oriented x 3.  SKIN: No obvious rash, lesion, or ulcer.   LABORATORY PANEL:   CBC  Recent Labs Lab 11/22/15 0557 11/23/15 1123 11/24/15 0503  WBC 6.7  --   --   HGB 7.2* 7.3* 8.2*  HCT 21.0* 21.5*  --   PLT 117*  --   --    ------------------------------------------------------------------------------------------------------------------ Chemistries   Recent Labs Lab 11/22/15 0557  NA 136  K 4.7  CL 104  CO2 22  GLUCOSE 165*  BUN 62*  CREATININE 6.49*  CALCIUM 9.2   ------------------------------------------------------------------------------------------------------------------  Cardiac Enzymes No results for input(s): TROPONINI in the last 168 hours. ------------------------------------------------------------------------------------------------------------------  RADIOLOGY:  No results found.   ASSESSMENT AND PLAN:   * Acute kidney injury over CKD3;Patient is somewhat better,   continue dialysis, nephrology  is planning to  set up outpatient  acute dialysis.  Discussed this with help of Spanish translator. Likely progression of myeloma kidney.  24-hour creatinine clearance was 7.    Discharge once out pt HD is arranged.   As per case manager- still no approval of that.  * Mid abdominal pain, hunger pain, thought to be due to Mild acute pancreatitis   Improved. Pain medications as needed  Tolerating food. H. pylori testing is negative.  * Anemia of chronic disease is stable   Required blood transfusion due to gradual drop.   Started on renal vitamin and Iron orally.  * Multiple myeloma Discussed with Dr. Grayland Ormond. Hold treatment at this point due to renal failure. Patient was on Velcade.  *Hypertension: Does have tachycardia:continue Verapamil 80 mg daily,add  ,verapamil 40 mg in the Evening. Blood pressure readings have improved significantly, as well as heart rate. V/Q SCAN was low probability for pulmonary embolism.  * Sinus tachycardia , likely due to fluid overload, improved with verapamil, VQ scan is low probability, TSH normal at 1.9.  *Anemia of renal failure and of multiple myeloma, worsening, transfuse patient with one packed red blood cell unit 11/23/15 with dialysis due to fatigue, weakness, follow hemoglobin level .   Started on renal vitamin and iron supplementation.   All the records are reviewed and case discussed with Care Management/Social Workerr. Management plans discussed with the patient and she is in agreement.  CODE STATUS: FULL CODE  DVT Prophylaxis: SCDs  TOTAL TIME TAKING CARE OF THIS PATIENT: 35  minutes. discussed via [place] interpreter help, discussed with nephrologist  POSSIBLE D/C IN 2-3 DAYS, DEPENDING ON CLINICAL CONDITION. Awaited Outpt HD arrangement.  Vaughan Basta M.D on 11/26/2015 at 10:35 PM  Between 7am to 6pm - Pager - [phone]  After 6pm go to [email or web address] - password [email or web address] [facility]  Office  [phone]  CC: Primary care  physician; [name], MD  Note: This dictation was prepared with Dragon dictation along with smaller phrase technology. Any transcriptional errors that result from this process are unintentional.

## 2015-11-26 NOTE — Care Management (Signed)
Spoke with [name] from financially counseling [phone].  Jamie Keller has submitted financial information to case worker. Has not received medicaid ID number.  Spoke with [name] at [place] on [address]  She states that she is awaiting for approval from [employer] language [employer].  Dr. Candiss Norse updated.

## 2015-11-26 NOTE — Care Management (Signed)
Additional financial information faxed to faxed to [facility] Patient Relations Liaison, also placement of PPD documentation and recent HD flowsheet

## 2015-11-27 LAB — GLUCOSE, CAPILLARY
GLUCOSE-CAPILLARY: 203 mg/dL — AB (ref 65–99)
Glucose-Capillary: 137 mg/dL — ABNORMAL HIGH (ref 65–99)
Glucose-Capillary: 154 mg/dL — ABNORMAL HIGH (ref 65–99)

## 2015-11-27 LAB — CBC
HEMATOCRIT: 24.2 % — AB (ref 35.0–47.0)
Hemoglobin: 8.3 g/dL — ABNORMAL LOW (ref 12.0–16.0)
MCH: 31 pg (ref 26.0–34.0)
MCHC: 34.3 g/dL (ref 32.0–36.0)
MCV: 90.4 fL (ref 80.0–100.0)
Platelets: 121 10*3/uL — ABNORMAL LOW (ref 150–440)
RBC: 2.67 MIL/uL — ABNORMAL LOW (ref 3.80–5.20)
RDW: 18.6 % — ABNORMAL HIGH (ref 11.5–14.5)
WBC: 7.6 10*3/uL (ref 3.6–11.0)

## 2015-11-27 LAB — RENAL FUNCTION PANEL
ALBUMIN: 2.9 g/dL — AB (ref 3.5–5.0)
Anion gap: 13 (ref 5–15)
BUN: 61 mg/dL — AB (ref 6–20)
CALCIUM: 9 mg/dL (ref 8.9–10.3)
CO2: 20 mmol/L — AB (ref 22–32)
CREATININE: 5.04 mg/dL — AB (ref 0.44–1.00)
Chloride: 104 mmol/L (ref 101–111)
GFR calc Af Amer: 11 mL/min — ABNORMAL LOW (ref >60)
GFR calc non Af Amer: 9 mL/min — ABNORMAL LOW (ref >60)
GLUCOSE: 224 mg/dL — AB (ref 65–99)
Phosphorus: 4.5 mg/dL (ref 2.5–4.6)
Potassium: 4.5 mmol/L (ref 3.5–5.1)
SODIUM: 137 mmol/L (ref 135–145)

## 2015-11-27 NOTE — Progress Notes (Signed)
Post hd vitals 

## 2015-11-27 NOTE — Progress Notes (Signed)
[facility]  ROUNDING NOTE   Subjective:   Conversation through [employer] interpreter  Overall doing well Renal - continued on dialysis. Seen during dialysis treatment. Tolerating well.    HEMODIALYSIS FLOWSHEET:  Blood Flow Rate (mL/min): 400 mL/min Arterial Pressure (mmHg): -190 mmHg Venous Pressure (mmHg): 150 mmHg Transmembrane Pressure (mmHg): 50 mmHg Ultrafiltration Rate (mL/min): 290 mL/min Dialysate Flow Rate (mL/min): 600 ml/min Conductivity: Machine : 14.1 Conductivity: Machine : 14.1 Dialysis Fluid Bolus: Normal Saline Bolus Amount (mL): 250 mL Dialysate Change: Other (comment) (3K) Intra-Hemodialysis Comments: 215. Resting  GI- no nausea or vomiting. Abdominal discomfort, constipation have improved. No nausea or vomiting Pulmonary - No SOB,     Objective:  Vital signs in last 24 hours:  Temp:  [98.2 F (36.8 C)-99.3 F (37.4 C)] 98.6 F (37 C) (09/21 0953) Pulse Rate:  [99-107] 100 (09/21 1030) Resp:  [18-22] 21 (09/21 1030) BP: (121-136)/(53-66) 131/59 (09/21 1030) SpO2:  [96 %-99 %] 98 % (09/21 1030) Weight:  [54.3 kg (119 lb 12.8 oz)-56.3 kg (124 lb 3.2 oz)] 54.3 kg (119 lb 12.8 oz) (09/21 0953)  Weight change: 1.937 kg (4 lb 4.3 oz) Filed Weights   11/26/15 0412 11/27/15 0526 11/27/15 0953  Weight: 55.6 kg (122 lb 9.2 oz) 56.3 kg (124 lb 3.2 oz) 54.3 kg (119 lb 12.8 oz)    Intake/Output: I/O last 3 completed shifts: In: 300 [P.O.:300] Out: 5 [Urine:5]   Intake/Output this shift:  Total I/O In: 360 [P.O.:360] Out: -   Physical Exam: General: No acute distress  Head: Normocephalic, atraumatic. Moist oral mucosal membranes  Eyes: Anicteric  Neck: Supple, trachea midline  Lungs:  Clear to auscultation, normal effort  Heart: S1S2 no rubs  Abdomen:  Soft, non tender  Extremities: Trace  peripheral edema.  Neurologic: Nonfocal, moving all four extremities  Skin: No lesions  Access: LIJ permcath 9/13 Dr. Lucky Cowboy, ry IJ port     Basic Metabolic Panel:  Recent Labs Lab 11/21/15 0415 11/22/15 0557 11/27/15 1005  NA 139  139 136 137  K 4.8  4.8 4.7 4.5  CL 103  103 104 104  CO2 25  25 22  20*  GLUCOSE 138*  138* 165* 224*  BUN 47*  48* 62* 61*  CREATININE 5.76*  5.71* 6.49* 5.04*  CALCIUM 9.0  9.0 9.2 9.0  PHOS 4.4 4.8* 4.5    Liver Function Tests:  Recent Labs Lab 11/21/15 0415 11/22/15 0557 11/27/15 1005  ALBUMIN 2.7* 2.7* 2.9*   No results for input(s): LIPASE, AMYLASE in the last 168 hours. No results for input(s): AMMONIA in the last 168 hours.  CBC:  Recent Labs Lab 11/21/15 0415 11/22/15 0557 11/23/15 1123 11/24/15 0503 11/27/15 0517  WBC 6.6 6.7  --   --  7.6  HGB 7.3* 7.2* 7.3* 8.2* 8.3*  HCT 21.1* 21.0* 21.5*  --  24.2*  MCV 89.8 91.1  --   --  90.4  PLT 113* 117*  --   --  121*    Cardiac Enzymes: No results for input(s): CKTOTAL, CKMB, CKMBINDEX, TROPONINI in the last 168 hours.  BNP: Invalid input(s): POCBNP  CBG:  Recent Labs Lab 11/26/15 0734 11/26/15 1147 11/26/15 1649 11/26/15 2131 11/27/15 0756  GLUCAP 121* 163* 183* 207* 137*    Microbiology: Results for orders placed or performed during the hospital encounter of 11/13/15  MRSA PCR Screening     Status: None   Collection Time: 11/14/15  3:11 AM  Result Value Ref Range Status  MRSA by PCR NEGATIVE NEGATIVE Final    Comment:        The GeneXpert MRSA Assay (FDA approved for NASAL specimens only), is one component of a comprehensive MRSA colonization surveillance program. It is not intended to diagnose MRSA infection nor to guide or monitor treatment for MRSA infections.     Coagulation Studies: No results for input(s): LABPROT, INR in the last 72 hours.  Urinalysis: No results for input(s): COLORURINE, LABSPEC, PHURINE, GLUCOSEU, HGBUR, BILIRUBINUR, KETONESUR, PROTEINUR, UROBILINOGEN, NITRITE, LEUKOCYTESUR in the last 72 hours.  Invalid input(s): APPERANCEUR     Imaging: No results found.   Medications:     . sodium chloride   Intravenous Once  . famotidine  20 mg Oral BID  . ferrous sulfate  325 mg Oral BID WC  . furosemide  80 mg Oral Daily  . gabapentin  200 mg Oral TID  . heparin subcutaneous  5,000 Units Subcutaneous Q12H  . insulin aspart  0-5 Units Subcutaneous QHS  . insulin aspart  0-9 Units Subcutaneous TID WC  . iopamidol  30 mL Oral Once  . multivitamin  1 tablet Oral QHS  . polyethylene glycol  17 g Oral Daily  . tuberculin  5 Units Intradermal Once  . verapamil  40 mg Oral Q24H  . verapamil  80 mg Oral Daily   sodium chloride, acetaminophen, heparin lock flush, heparin lock flush, menthol-cetylpyridinium, oxyCODONE, sodium chloride flush, sodium chloride flush  Assessment/ Plan:  50 y.o.hispanic female with diabetes mellitus type 2, hypertension, multiple myeloma on chemotherapy, osteoarthritis, chronic kidney disease stage III   1. Acute renal failure on CKD stage III baseline Cr 1.4 from 8/28/217.  Concern for progression of myeloma kidney. Nonoliguric urine output. - 24 hour for creatinine clearance 7.    - Dialysis  TTS schedule.  - will need intermittent dialysis until new baseline renal function established.  -  [facility] [address] Orders have been sent by Dr Juleen China - awaiting placement   2. 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 With thrombocytopenia. PRBC transfusion on 9/7. Hemoglobin 8.3 - Oncology: Dr. Grayland Ormond  3. Multiple Myeloma:  Chemo plans as per oncology team      LOS: [place] 9/21/201710:57 AM

## 2015-11-27 NOTE — Progress Notes (Signed)
  End of hd 

## 2015-11-27 NOTE — Progress Notes (Signed)
PPD result was read  11/27/2015 at 18:18 No induration noted.  0 mm induration noted.

## 2015-11-27 NOTE — Care Management (Signed)
Spoke with patient via interpreter.  I provided patient with list of additional local dialysis center, should [facility] on [address] not be able to accept the patient.  Patient states that she would be willing to pursue any center in [place].  I have notified  [facility] that patient would also be willing to attend [facility] on [address] or [facility] on [address] [place].

## 2015-11-27 NOTE — Progress Notes (Signed)
Pre dialysis  

## 2015-11-27 NOTE — Progress Notes (Signed)
Post hd assessment 

## 2015-11-27 NOTE — Progress Notes (Signed)
[facility] at [place] NAME: Jamie Keller    MR#:  [id number]  DATE OF BIRTH:  1966/01/21    CHIEF COMPLAINT:   Chief Complaint  Patient presents with  . Abdominal Pain  . Respiratory Distress   Waiting for OP HD for discharge. Feels weak and dizzy at times  REVIEW OF SYSTEMS:    Review of Systems  Constitutional: Negative for chills and fever.  HENT: Negative for sore throat.   Eyes: Negative for blurred vision, double vision and pain.  Respiratory: Negative for cough, hemoptysis, shortness of breath and wheezing.   Cardiovascular: Negative for chest pain, palpitations, orthopnea and leg swelling.  Gastrointestinal: Negative for abdominal pain, constipation, diarrhea, heartburn, nausea and vomiting.  Genitourinary: Negative for dysuria and hematuria.  Musculoskeletal: Negative for back pain and joint pain.  Skin: Negative for rash.  Neurological: Negative for dizziness, sensory change, speech change, focal weakness and headaches.  Endo/Heme/Allergies: Does not bruise/bleed easily.  Psychiatric/Behavioral: Negative for depression. The patient is not nervous/anxious.     DRUG ALLERGIES:   Allergies  Allergen Reactions  . No Known Allergies     VITALS:  Blood pressure (!) 131/55, pulse (!) 103, temperature 98.4 F (36.9 C), temperature source Oral, resp. rate 18, height 5' 5"  (1.651 m), weight 54.3 kg (119 lb 12.8 oz), last menstrual period 11/20/1985, SpO2 97 %.  PHYSICAL EXAMINATION:   Physical Exam  GENERAL:  50 y.o.-year-old patient lying in the bed with no acute distress.  EYES: Pupils equal, round, reactive to light and accommodation. No scleral icterus. Extraocular muscles intact.  HEENT: Head atraumatic, normocephalic. Oropharynx and nasopharynx clear.  NECK:  Supple, no jugular venous distention. No thyroid enlargement, no tenderness.  LUNGS: Markedly Diminished breath sounds bilaterally posteriorly,  no wheezing, rales, rhonchi or crackles. Intermittent use of accessory muscles of respiration, mostly with movement, speech.  CARDIOVASCULAR: S1, S2 normal. No murmurs, rubs, or gallops.  ABDOMEN: Soft, nondistended. Bowel sounds present. No organomegaly or mass. Mild epigastric area tenderness EXTREMITIES: No cyanosis, clubbing , no lower extremity and pedal edema b/l.    NEUROLOGIC: Cranial nerves II through XII are intact. No focal Motor or sensory deficits b/l.   PSYCHIATRIC: The patient is alert and oriented x 3.  SKIN: No obvious rash, lesion, or ulcer.   LABORATORY PANEL:   CBC  Recent Labs Lab 11/27/15 0517  WBC 7.6  HGB 8.3*  HCT 24.2*  PLT 121*   ------------------------------------------------------------------------------------------------------------------ Chemistries   Recent Labs Lab 11/27/15 1005  NA 137  K 4.5  CL 104  CO2 20*  GLUCOSE 224*  BUN 61*  CREATININE 5.04*  CALCIUM 9.0   ------------------------------------------------------------------------------------------------------------------  Cardiac Enzymes No results for input(s): TROPONINI in the last 168 hours. ------------------------------------------------------------------------------------------------------------------  RADIOLOGY:  No results found.   ASSESSMENT AND PLAN:   * Acute kidney injury over CKD3 Now ESRD  continue dialysis Likely progression of myeloma kidney.  24-hour creatinine clearance was 7.    Discharge once out pt HD is arranged.  * Mild acute pancreatitis   Improved. Pain medications as needed  Tolerating food. H. pylori testing is negative.  * Anemia of chronic disease   Required blood transfusion due to gradual drop.  * Multiple myeloma Per Dr. Grayland Ormond. Hold treatment at this point due to renal failure. Patient was on Velcade.  *Hypertension On Verapamil  All the records are reviewed and case discussed with Care Management/Social Workerr. Management  plans discussed with  the patient and she is in agreement.  CODE STATUS: FULL CODE  DVT Prophylaxis: SCDs  TOTAL TIME TAKING CARE OF THIS PATIENT - 25 minutes  Waiting on Outpt HD arrangement for discharge  Hillary Bow R M.D on 11/27/2015 at 1:43 PM  Between 7am to 6pm - Pager - [phone]  After 6pm go to [email or web address] - password [email or web address] [place] [facility]  Office  [phone]  CC: Primary care physician; Jamie Jarvis, MD  Note: This dictation was prepared with Dragon dictation along with smaller phrase technology. Any transcriptional errors that result from this process are unintentional.

## 2015-11-27 NOTE — Progress Notes (Signed)
Dialysis started 

## 2015-11-27 NOTE — Progress Notes (Signed)
Pre Dialysis 

## 2015-11-28 LAB — GLUCOSE, CAPILLARY
GLUCOSE-CAPILLARY: 165 mg/dL — AB (ref 65–99)
Glucose-Capillary: 131 mg/dL — ABNORMAL HIGH (ref 65–99)

## 2015-11-28 MED ORDER — FERROUS SULFATE 325 (65 FE) MG PO TABS: 325.0000 mg | ORAL_TABLET | Freq: Every day | ORAL | 0 refills | Status: DC

## 2015-11-28 MED ORDER — FUROSEMIDE 80 MG PO TABS: 80.0000 mg | ORAL_TABLET | Freq: Every day | ORAL | 0 refills | Status: DC

## 2015-11-28 MED ORDER — VERAPAMIL HCL 40 MG PO TABS: 40.0000 mg | ORAL_TABLET | Freq: Two times a day (BID) | ORAL | 0 refills | Status: DC

## 2015-11-28 NOTE — Discharge Summary (Signed)
[facility] NAME: Jamie Keller    MR#:  [id number]  DATE OF BIRTH:  05/09/1965  DATE OF ADMISSION:  11/13/2015 ADMITTING PHYSICIAN: Laverle Hobby, MD  DATE OF DISCHARGE: No discharge date for patient encounter.  PRIMARY CARE PHYSICIAN: [name], MD   ADMISSION DIAGNOSIS:  Metabolic acidosis, normal anion gap (NAG) [E87.2] Respiratory distress [R06.00] Transaminitis [R74.0] Multiple myeloma not having achieved remission (HCC) [C90.00] Acute renal failure, unspecified acute renal failure type ([place]) [N17.9] Anemia, unspecified anemia type [D64.9]  DISCHARGE DIAGNOSIS:  Active Problems:   Acute respiratory failure (HCC)   Acute renal failure ([place])   SECONDARY DIAGNOSIS:   Past Medical History:  Diagnosis Date  . Diabetes mellitus without complication ([place])   . Hypertension   . Multiple myeloma ([place])   . Osteoarthritis   . Post-menopausal 2014     ADMITTING HISTORY  HISTORY OF PRESENT ILLNESS:   [name] is a 50 year with past medical Hx of Multiple Myeloma, Diabetes Melitus, and hypertension.  Patient  Presented to ED on 9/7 with labored respiration.  Patient has not received any chemotherapy due to low blood counts.  Patient complaints of abdominal distension since Monday , poor bowel movement since then.  She has been very weak and short of breath. She denies any fever, chills , chest pain.  PAST MEDICAL HISTORY :  She  has a past medical history of Diabetes mellitus without complication ([place]); Hypertension; Multiple myeloma ([place]); Osteoarthritis; and Post-menopausal (2014).  PAST SURGICAL HISTORY: She  has a past surgical history that includes Cesarean section.  HOSPITAL COURSE:   * Acute kidney injury over CKD3 Patient continues to need intermittent dialysis. Tunnel dialysis catheter in place. This has been set up with [facility] and first appointment is on  12/02/2015. Likely progression of myeloma kidney.  24-hour creatinine clearance was 7.    discussed with Dr. Candiss Norse of nephrology on day of discharge. Patient will follow-up with Dr. Holley Raring.  * Mild acute pancreatitis   Improved. Pain medications as needed  Tolerating food. H. pylori testing is negative.  * Anemia of chronic disease   Required blood transfusion due to gradual drop.  * Multiple myeloma Per Dr. Grayland Ormond. Hold treatment at this point due to renal failure. Patient was on Velcade. Follow-up as outpatient at the cancer center  *Hypertension On Verapamil  Stable for discharge home.  CONSULTS OBTAINED:  Treatment Team:  Anthonette Legato, MD  DRUG ALLERGIES:   Allergies  Allergen Reactions  . No Known Allergies     DISCHARGE MEDICATIONS:   Current Discharge Medication List    START taking these medications   Details  ferrous sulfate 325 (65 FE) MG tablet Take 1 tablet (325 mg total) by mouth daily with breakfast. Qty: 30 tablet, Refills: 0    furosemide (LASIX) 80 MG tablet Take 1 tablet (80 mg total) by mouth daily. Qty: 30 tablet, Refills: 0      CONTINUE these medications which have CHANGED   Details  verapamil (CALAN) 40 MG tablet Take 1 tablet (40 mg total) by mouth 2 (two) times daily. Qty: 60 tablet, Refills: 0      CONTINUE these medications which have NOT CHANGED   Details  gabapentin (NEURONTIN) 300 MG capsule Take 1 capsule (300 mg total) by mouth 3 (three) times daily. Qty: 90 capsule, Refills: 2    megestrol (MEGACE) 40 MG tablet Take 1 tablet (40 mg total) by mouth  daily. Qty: 30 tablet, Refills: 2    Oxycodone HCl 10 MG TABS Take 1 tablet (10 mg total) by mouth every 6 (six) hours as needed. Qty: 60 tablet, Refills: 0    sucralfate (CARAFATE) 1 g tablet Take 1 tablet (1 g total) by mouth 2 (two) times daily. Qty: 60 tablet, Refills: 3    Vitamin D, Cholecalciferol, 400 UNITS CAPS Take 400 mg by mouth 2 (two) times daily. Reported  on 07/31/2015   Associated Diagnoses: Multiple myeloma in remission ([place])        Today   VITAL SIGNS:  Blood pressure (!) 106/40, pulse 100, temperature 98.7 F (37.1 C), temperature source Oral, resp. rate 16, height 5' 5"  (1.651 m), weight 50.9 kg (112 lb 4.8 oz), last menstrual period 11/20/1985, SpO2 99 %.  I/O:   Intake/Output Summary (Last 24 hours) at 11/28/15 1303 Last data filed at 11/28/15 1000  Gross per 24 hour  Intake              240 ml  Output             1700 ml  Net            -1460 ml    PHYSICAL EXAMINATION:  Physical Exam  GENERAL:  50 y.o.-year-old patient lying in the bed with no acute distress.  LUNGS: Normal breath sounds bilaterally, no wheezing, rales,rhonchi or crepitation. No use of accessory muscles of respiration.  CARDIOVASCULAR: S1, S2 normal. No murmurs, rubs, or gallops.  ABDOMEN: Soft, non-tender, non-distended. Bowel sounds present. No organomegaly or mass.  NEUROLOGIC: Moves all 4 extremities. PSYCHIATRIC: The patient is alert and oriented x 3.  SKIN: No obvious rash, lesion, or ulcer.   DATA REVIEW:   CBC  Recent Labs Lab 11/27/15 0517  WBC 7.6  HGB 8.3*  HCT 24.2*  PLT 121*    Chemistries   Recent Labs Lab 11/27/15 1005  NA 137  K 4.5  CL 104  CO2 20*  GLUCOSE 224*  BUN 61*  CREATININE 5.04*  CALCIUM 9.0    Cardiac Enzymes No results for input(s): TROPONINI in the last 168 hours.  Microbiology Results  Results for orders placed or performed during the hospital encounter of 11/13/15  MRSA PCR Screening     Status: None   Collection Time: 11/14/15  3:11 AM  Result Value Ref Range Status   MRSA by PCR NEGATIVE NEGATIVE Final    Comment:        The GeneXpert MRSA Assay (FDA approved for NASAL specimens only), is one component of a comprehensive MRSA colonization surveillance program. It is not intended to diagnose MRSA infection nor to guide or monitor treatment for MRSA infections.     RADIOLOGY:   No results found.  Follow up with PCP in 1 week.  Management plans discussed with the patient, family and they are in agreement.  CODE STATUS:     Code Status Orders        Start     Ordered   11/14/15 0051  Full code  Continuous     11/14/15 0052    Code Status History    Date Active Date Inactive Code Status Order ID Comments User Context   11/14/2015 12:52 AM 11/17/2015  1:12 PM Full Code [id number]  Holley Raring, NP ED   11/14/2015 12:06 AM 11/14/2015 12:12 AM Full Code [id number]  Holley Raring, NP ED      TOTAL TIME TAKING CARE OF THIS  PATIENT ON DAY OF DISCHARGE: more than 30 minutes.   Hillary Bow R M.D on 11/28/2015 at 1:03 PM  Between 7am to 6pm - Pager - [phone]  After 6pm go to [email or web address] - password [email or web address] [facility]  Office  [phone]  CC: Primary care physician; [name], MD  Note: This dictation was prepared with Dragon dictation along with smaller phrase technology. Any transcriptional errors that result from this process are unintentional.

## 2015-11-28 NOTE — Care Management (Signed)
Patient has received confirmation of outpatient dialysis.  Patient to go to [facility].  [place]  Tuesdays. Thursdays, Saturdays 11:20am with a start date of 12/02/15.   I have reviewed this information with patient via [place] interpreter.

## 2015-11-28 NOTE — Discharge Instructions (Signed)
Resume diet and activity as before ° ° °

## 2015-11-28 NOTE — Progress Notes (Signed)
Patient discharged, R chest port deaccessed. Concerns addressed. Prescription given. Interpreter utilized. Discharge summary given to patient.

## 2015-11-28 NOTE — Care Management (Signed)
Patient to discharge today.  Daughter at bedside for transportation.  Outpatient HD information also relayed to patient's daughter.  Requesting additional resources for transportation should family not be available for transport to HD.  Patient and daughter provided information on [employer].  Patient states that they are able to afford the $6 round trip.  I have encouraged patient to call today to set up transportation for Tuesday.  Daughter states that she amy be able to transport Tuesday if needed.  RNCM signing off

## 2015-11-28 NOTE — Care Management (Signed)
Negative PPD results faxed to [facility] Patient pathways liaison.

## 2015-12-01 ENCOUNTER — Inpatient Hospital Stay
Admission: EM | Admit: 2015-12-01 | Discharge: 2015-12-08 | DRG: 853 | Disposition: A | Discharge status: Home / Self Care | Payer: Self-pay | Attending: Internal Medicine | Admitting: Internal Medicine

## 2015-12-01 ENCOUNTER — Encounter: Payer: Self-pay | Admitting: Emergency Medicine

## 2015-12-01 ENCOUNTER — Emergency Department: Payer: Self-pay

## 2015-12-01 DIAGNOSIS — F32A Depression, unspecified: Secondary | ICD-10-CM | POA: Diagnosis present

## 2015-12-01 DIAGNOSIS — Z9889 Other specified postprocedural states: Secondary | ICD-10-CM

## 2015-12-01 DIAGNOSIS — J9600 Acute respiratory failure, unspecified whether with hypoxia or hypercapnia: Secondary | ICD-10-CM | POA: Diagnosis present

## 2015-12-01 DIAGNOSIS — I120 Hypertensive chronic kidney disease with stage 5 chronic kidney disease or end stage renal disease: Secondary | ICD-10-CM | POA: Diagnosis present

## 2015-12-01 DIAGNOSIS — M1990 Unspecified osteoarthritis, unspecified site: Secondary | ICD-10-CM | POA: Diagnosis present

## 2015-12-01 DIAGNOSIS — Z992 Dependence on renal dialysis: Secondary | ICD-10-CM

## 2015-12-01 DIAGNOSIS — J189 Pneumonia, unspecified organism: Secondary | ICD-10-CM | POA: Diagnosis present

## 2015-12-01 DIAGNOSIS — E119 Type 2 diabetes mellitus without complications: Secondary | ICD-10-CM | POA: Diagnosis present

## 2015-12-01 DIAGNOSIS — Z79899 Other long term (current) drug therapy: Secondary | ICD-10-CM

## 2015-12-01 DIAGNOSIS — R0602 Shortness of breath: Secondary | ICD-10-CM

## 2015-12-01 DIAGNOSIS — N186 End stage renal disease: Secondary | ICD-10-CM | POA: Diagnosis present

## 2015-12-01 DIAGNOSIS — A419 Sepsis, unspecified organism: Principal | ICD-10-CM | POA: Diagnosis present

## 2015-12-01 DIAGNOSIS — Y95 Nosocomial condition: Secondary | ICD-10-CM | POA: Diagnosis present

## 2015-12-01 DIAGNOSIS — R0600 Dyspnea, unspecified: Secondary | ICD-10-CM

## 2015-12-01 DIAGNOSIS — C9000 Multiple myeloma not having achieved remission: Secondary | ICD-10-CM | POA: Diagnosis present

## 2015-12-01 DIAGNOSIS — N950 Postmenopausal bleeding: Secondary | ICD-10-CM | POA: Diagnosis present

## 2015-12-01 DIAGNOSIS — I10 Essential (primary) hypertension: Secondary | ICD-10-CM | POA: Diagnosis present

## 2015-12-01 DIAGNOSIS — D631 Anemia in chronic kidney disease: Secondary | ICD-10-CM | POA: Diagnosis present

## 2015-12-01 DIAGNOSIS — D696 Thrombocytopenia, unspecified: Secondary | ICD-10-CM | POA: Diagnosis present

## 2015-12-01 DIAGNOSIS — N2581 Secondary hyperparathyroidism of renal origin: Secondary | ICD-10-CM | POA: Diagnosis present

## 2015-12-01 DIAGNOSIS — F329 Major depressive disorder, single episode, unspecified: Secondary | ICD-10-CM | POA: Diagnosis present

## 2015-12-01 LAB — COMPREHENSIVE METABOLIC PANEL
ALT: 48 U/L (ref 14–54)
ANION GAP: 14 (ref 5–15)
AST: 142 U/L — ABNORMAL HIGH (ref 15–41)
Albumin: 3.2 g/dL — ABNORMAL LOW (ref 3.5–5.0)
Alkaline Phosphatase: 104 U/L (ref 38–126)
BILIRUBIN TOTAL: 1.4 mg/dL — AB (ref 0.3–1.2)
BUN: 33 mg/dL — ABNORMAL HIGH (ref 6–20)
CALCIUM: 8.7 mg/dL — AB (ref 8.9–10.3)
CO2: 27 mmol/L (ref 22–32)
Chloride: 98 mmol/L — ABNORMAL LOW (ref 101–111)
Creatinine, Ser: 3.01 mg/dL — ABNORMAL HIGH (ref 0.44–1.00)
GFR calc non Af Amer: 17 mL/min — ABNORMAL LOW (ref >60)
GFR, EST AFRICAN AMERICAN: 20 mL/min — AB (ref >60)
Glucose, Bld: 176 mg/dL — ABNORMAL HIGH (ref 65–99)
POTASSIUM: 3.3 mmol/L — AB (ref 3.5–5.1)
SODIUM: 139 mmol/L (ref 135–145)
TOTAL PROTEIN: 7.6 g/dL (ref 6.5–8.1)

## 2015-12-01 LAB — URINALYSIS COMPLETE WITH MICROSCOPIC (ARMC ONLY)
BILIRUBIN URINE: NEGATIVE
Bacteria, UA: NONE SEEN
GLUCOSE, UA: 150 mg/dL — AB
KETONES UR: NEGATIVE mg/dL
LEUKOCYTES UA: NEGATIVE
Nitrite: NEGATIVE
PH: 9 — AB (ref 5.0–8.0)
Protein, ur: 100 mg/dL — AB
Specific Gravity, Urine: 1.008 (ref 1.005–1.030)

## 2015-12-01 LAB — CBC WITH DIFFERENTIAL/PLATELET
BLASTS: 0 %
Band Neutrophils: 4 %
Basophils Absolute: 0 10*3/uL (ref 0–0.1)
Basophils Relative: 0 %
Eosinophils Absolute: 0.1 10*3/uL (ref 0–0.7)
Eosinophils Relative: 1 %
HEMATOCRIT: 22.9 % — AB (ref 35.0–47.0)
HEMOGLOBIN: 7.9 g/dL — AB (ref 12.0–16.0)
LYMPHS PCT: 10 %
Lymphs Abs: 0.6 10*3/uL — ABNORMAL LOW (ref 1.0–3.6)
MCH: 30.3 pg (ref 26.0–34.0)
MCHC: 34.3 g/dL (ref 32.0–36.0)
MCV: 88.2 fL (ref 80.0–100.0)
Metamyelocytes Relative: 2 %
Monocytes Absolute: 0.3 10*3/uL (ref 0.2–0.9)
Monocytes Relative: 5 %
Myelocytes: 0 %
NEUTROS PCT: 78 %
NRBC: 0 /100{WBCs}
Neutro Abs: 4.5 10*3/uL (ref 1.4–6.5)
OTHER: 0 %
PROMYELOCYTES ABS: 0 %
Platelets: 126 10*3/uL — ABNORMAL LOW (ref 150–440)
RBC: 2.6 MIL/uL — ABNORMAL LOW (ref 3.80–5.20)
RDW: 18.2 % — ABNORMAL HIGH (ref 11.5–14.5)
WBC: 5.5 10*3/uL (ref 3.6–11.0)

## 2015-12-01 LAB — LACTIC ACID, PLASMA: LACTIC ACID, VENOUS: 1.8 mmol/L (ref 0.5–1.9)

## 2015-12-01 MED ORDER — SODIUM CHLORIDE 0.9 % IV BOLUS (SEPSIS)
500.0000 mL | Freq: Once | INTRAVENOUS | Status: AC
  Administered 2015-12-01: 500 mL via INTRAVENOUS

## 2015-12-01 MED ORDER — SODIUM CHLORIDE 0.9 % IV SOLN: 1000.0000 mL | Freq: Once | INTRAVENOUS | Status: DC

## 2015-12-01 MED ORDER — DEXTROSE 5 % IV SOLN
2.0000 g | Freq: Once | INTRAVENOUS | Status: AC
  Administered 2015-12-01: 2 g via INTRAVENOUS
  Filled 2015-12-01: qty 2

## 2015-12-01 MED ORDER — VANCOMYCIN HCL IN DEXTROSE 1-5 GM/200ML-% IV SOLN
1000.0000 mg | Freq: Once | INTRAVENOUS | Status: AC
  Administered 2015-12-01: 1000 mg via INTRAVENOUS
  Filled 2015-12-01: qty 200

## 2015-12-01 NOTE — Progress Notes (Signed)
Pharmacy Antibiotic Note  Jamie Keller is a 50 y.o. female admitted on 12/01/2015 with pneumonia.  Pharmacy has been consulted for cefepime and vancomycin dosing.  Plan: 1. Cefepime 2 gm IV Q24H (CrCl approximately 15 to 20 mL/min) 2. Vancomycin 1 gm IV x 1 in ED followed in 36 hours (stacked dosing) by vancomycin 1 gm IV Q48H. Predicted trough 18 mcg/mL. Pharmacy will continue to follow and adjust as needed to maintain trough 15 to 20 mcg/mL.   Vd 36.1 L, Ke 0.019 hr-1, T1/2 35.9 hr  Height: 5\' 2"  (157.5 cm) Weight: 120 lb (54.4 kg) IBW/kg (Calculated) : 50.1  Temp (24hrs), Avg:102 F (38.9 C), Min:101.5 F (38.6 C), Max:102.4 F (39.1 C)   Recent Labs Lab 11/27/15 0517 11/27/15 1005 12/01/15 2042  WBC 7.6  --  5.5  CREATININE  --  5.04* 3.01*  LATICACIDVEN  --   --  1.8    Estimated Creatinine Clearance: 17.5 mL/min (by C-G formula based on SCr of 3.01 mg/dL (H)).    Allergies  Allergen Reactions  . No Known Allergies    Thank you for allowing pharmacy to be a part of this patient's care.  Laural Benes, Pharm.D., BCPS Clinical Pharmacist 12/01/2015 10:38 PM

## 2015-12-01 NOTE — H&P (Signed)
[facility] [place] NAME: Jamie Keller    MR#:  [id number]  DATE OF BIRTH:  [birth year]  DATE OF ADMISSION:  12/01/2015  PRIMARY CARE PHYSICIAN: [name], MD   REQUESTING/REFERRING PHYSICIAN: Owens Shark, MD  CHIEF COMPLAINT:   Chief Complaint  Patient presents with  . Fever    HISTORY OF PRESENT ILLNESS:  Tiarrah Saville  is a 50 y.o. female who presents with Fever, cough.  Patient was recently admitted here for an extended period of time. She has a history of multiple myeloma and was recently taken off her Velcade. She had some kidney failure requiring dialysis which was continued intermittently on discharge. She returns today meeting sepsis criteria. Likely source is pneumonia. Hospitalists were called for admission.  PAST MEDICAL HISTORY:   Past Medical History:  Diagnosis Date  . Diabetes mellitus without complication ([place])   . Hypertension   . Multiple myeloma ([place])   . Osteoarthritis   . Post-menopausal 2014    PAST SURGICAL HISTORY:   Past Surgical History:  Procedure Laterality Date  . CESAREAN SECTION     x2  . PERIPHERAL VASCULAR CATHETERIZATION N/A 11/19/2015   Procedure: Dialysis/Perma Catheter Insertion;  Surgeon: Algernon Huxley, MD;  Location: [facility];  Service: Cardiovascular;  Laterality: N/A;    SOCIAL HISTORY:   Social History  Substance Use Topics  . Smoking status: Never Smoker  . Smokeless tobacco: Never Used  . Alcohol use No    FAMILY HISTORY:   Family History  Problem Relation Age of Onset  . Hypercholesterolemia Mother   . Hypertension Father     DRUG ALLERGIES:   Allergies  Allergen Reactions  . No Known Allergies     MEDICATIONS AT HOME:   Prior to Admission medications   Medication Sig Start Date End Date Taking? Authorizing Provider  ferrous sulfate 325 (65 FE) MG tablet Take 1 tablet (325 mg total) by mouth daily with breakfast. 11/28/15    Hillary Bow, MD  furosemide (LASIX) 80 MG tablet Take 1 tablet (80 mg total) by mouth daily. 11/29/15   Srikar Sudini, MD  gabapentin (NEURONTIN) 300 MG capsule Take 1 capsule (300 mg total) by mouth 3 (three) times daily. 11/06/15   Lloyd Huger, MD  megestrol (MEGACE) 40 MG tablet Take 1 tablet (40 mg total) by mouth daily. 10/06/15   Lloyd Huger, MD  Oxycodone HCl 10 MG TABS Take 1 tablet (10 mg total) by mouth every 6 (six) hours as needed. 10/03/15   Lloyd Huger, MD  sucralfate (CARAFATE) 1 g tablet Take 1 tablet (1 g total) by mouth 2 (two) times daily. Patient not taking: Reported on 10/06/2015 09/03/15   Noreene Filbert, MD  verapamil (CALAN) 40 MG tablet Take 1 tablet (40 mg total) by mouth 2 (two) times daily. 11/28/15   Hillary Bow, MD  Vitamin D, Cholecalciferol, 400 UNITS CAPS Take 400 mg by mouth 2 (two) times daily. Reported on 07/31/2015    Historical Provider, MD    REVIEW OF SYSTEMS:  Review of Systems  Constitutional: Positive for fever and malaise/fatigue. Negative for chills and weight loss.  HENT: Negative for ear pain, hearing loss and tinnitus.   Eyes: Negative for blurred vision, double vision, pain and redness.  Respiratory: Positive for cough and shortness of breath. Negative for hemoptysis.   Cardiovascular: Negative for chest pain, palpitations, orthopnea and leg swelling.  Gastrointestinal: Negative for abdominal pain,  constipation, diarrhea, nausea and vomiting.  Genitourinary: Negative for dysuria, frequency and hematuria.  Musculoskeletal: Negative for back pain, joint pain and neck pain.  Skin:       No acne, rash, or lesions  Neurological: Negative for dizziness, tremors, focal weakness and weakness.  Endo/Heme/Allergies: Negative for polydipsia. Does not bruise/bleed easily.  Psychiatric/Behavioral: Negative for depression. The patient is not nervous/anxious and does not have insomnia.      VITAL SIGNS:   Vitals:   12/01/15 2230  12/01/15 2315 12/01/15 2330 12/01/15 2345  BP: (!) 151/55  (!) 137/55   Pulse: (!) 123 (!) 120 (!) 119 (!) 118  Resp: 19 (!) 28 (!) 27 (!) 28  Temp:      TempSrc:      SpO2: 95% 95% 95% 94%  Weight:      Height:       Wt Readings from Last 3 Encounters:  12/01/15 54.4 kg (120 lb)  11/28/15 50.9 kg (112 lb 4.8 oz)  10/27/15 52.3 kg (115 lb 4.8 oz)    PHYSICAL EXAMINATION:  Physical Exam  Vitals reviewed. Constitutional: She is oriented to person, place, and time. She appears well-developed and well-nourished. No distress.  HENT:  Head: Normocephalic and atraumatic.  Mouth/Throat: Oropharynx is clear and moist.  Eyes: Conjunctivae and EOM are normal. Pupils are equal, round, and reactive to light. No scleral icterus.  Neck: Normal range of motion. Neck supple. No JVD present. No thyromegaly present.  Cardiovascular: Normal rate, regular rhythm and intact distal pulses.  Exam reveals no gallop and no friction rub.   No murmur heard. Respiratory: Effort normal. No respiratory distress. She has no wheezes. She has no rales.  Left lower lobe coarse breath sounds  GI: Soft. Bowel sounds are normal. She exhibits no distension. There is no tenderness.  Musculoskeletal: Normal range of motion. She exhibits no edema.  No arthritis, no gout  Lymphadenopathy:    She has no cervical adenopathy.  Neurological: She is alert and oriented to person, place, and time. No cranial nerve deficit.  No dysarthria, no aphasia  Skin: Skin is warm and dry. No rash noted. No erythema.  Psychiatric: She has a normal mood and affect. Her behavior is normal. Judgment and thought content normal.    LABORATORY PANEL:   CBC  Recent Labs Lab 12/01/15 2042  WBC 5.5  HGB 7.9*  HCT 22.9*  PLT 126*   ------------------------------------------------------------------------------------------------------------------  Chemistries   Recent Labs Lab 12/01/15 2042  NA 139  K 3.3*  CL 98*  CO2 27   GLUCOSE 176*  BUN 33*  CREATININE 3.01*  CALCIUM 8.7*  AST 142*  ALT 48  ALKPHOS 104  BILITOT 1.4*   ------------------------------------------------------------------------------------------------------------------  Cardiac Enzymes No results for input(s): TROPONINI in the last 168 hours. ------------------------------------------------------------------------------------------------------------------  RADIOLOGY:  Dg Chest Port 1 View  Result Date: 12/01/2015 CLINICAL DATA:  Fever.  Dialysis patient. EXAM: PORTABLE CHEST 1 VIEW COMPARISON:  11/21/2015 FINDINGS: Improved aeration with decrease in pulmonary edema. Improved aeration in the lung bases with decreased atelectasis/infiltrate. Improvement in small bilateral pleural effusions Left jugular dialysis catheter in the right atrium unchanged. Port-A-Cath tip in the SVC unchanged. IMPRESSION: Interval improvement in pulmonary edema. Improvement in bibasilar airspace disease and small bilateral effusions. Electronically Signed   By: Franchot Gallo M.D.   On: 12/01/2015 21:44    EKG:   Orders placed or performed during the hospital encounter of 11/13/15  . ED EKG  . ED EKG  IMPRESSION AND PLAN:  Principal Problem:   Sepsis ([place]) - IV antibiotics started in the ED, continue on admission. Culture sent from the ED. Patient is hemodynamically stable. Lactic acid was within normal limits. Active Problems:   HCAP (healthcare-associated pneumonia) - IV antibiotics and cultures as above, when necessary antitussive, when necessary duo nebs   Multiple myeloma ([place]) - recently taken off of her Velcade, will monitor for now   End stage renal failure on dialysis [facility]) - nephrology consult for dialysis support   HTN (hypertension) - stable, continue home meds   Depression - continue home meds  All the records are reviewed and case discussed with ED provider. Management plans discussed with the patient and/or family.  DVT PROPHYLAXIS:  SubQ heparin  GI PROPHYLAXIS: None  ADMISSION STATUS: Inpatient  CODE STATUS: Full Code Status History    Date Active Date Inactive Code Status Order ID Comments User Context   11/14/2015 12:52 AM 11/17/2015  1:12 PM Full Code [id number]  Holley Raring, NP ED   11/14/2015 12:06 AM 11/14/2015 12:12 AM Full Code [id number]  Holley Raring, NP ED      TOTAL TIME TAKING CARE OF THIS PATIENT: 45 minutes.    Shakiah Wester FIELDING 12/01/2015, 11:59 PM  [facility]  Office  [phone]  CC: Primary care physician; [name], MD

## 2015-12-01 NOTE — ED Provider Notes (Signed)
[facility] Emergency Department Provider Note   ____________________________________________    I have reviewed the triage vital signs and the nursing notes.   HISTORY  Chief Complaint Fever  Spanish interpreter used   HPI Jamie Keller is a 50 y.o. female who was sent to the emergency department from dialysis by Dr. Holley Raring for elevated heart rate, fever and cough. Patient was recently admitted to the hospital for several weeks. She notes she has developed a productive cough which is constant and feels short of breath. She has had a fever today. She denies chest pain. She has a history of multiple myeloma not currently being treated because of low blood counts     Past Medical History:  Diagnosis Date  . Diabetes mellitus without complication ([place])   . Hypertension   . Multiple myeloma ([place])   . Osteoarthritis   . Post-menopausal 2014    Patient Active Problem List   Diagnosis Date Noted  . Acute respiratory failure ([place]) 11/14/2015  . Acute renal failure ([place])   . Multiple myeloma in relapse ([place]) 09/06/2015  . Post-menopausal   . Bulging lumbar disc 05/28/2014    Past Surgical History:  Procedure Laterality Date  . CESAREAN SECTION     x2  . PERIPHERAL VASCULAR CATHETERIZATION N/A 11/19/2015   Procedure: Dialysis/Perma Catheter Insertion;  Surgeon: Algernon Huxley, MD;  Location: [facility];  Service: Cardiovascular;  Laterality: N/A;    Prior to Admission medications   Medication Sig Start Date End Date Taking? Authorizing Provider  ferrous sulfate 325 (65 FE) MG tablet Take 1 tablet (325 mg total) by mouth daily with breakfast. 11/28/15   Hillary Bow, MD  furosemide (LASIX) 80 MG tablet Take 1 tablet (80 mg total) by mouth daily. 11/29/15   Srikar Sudini, MD  gabapentin (NEURONTIN) 300 MG capsule Take 1 capsule (300 mg total) by mouth 3 (three) times daily. 11/06/15   Lloyd Huger, MD  megestrol (MEGACE) 40 MG  tablet Take 1 tablet (40 mg total) by mouth daily. 10/06/15   Lloyd Huger, MD  Oxycodone HCl 10 MG TABS Take 1 tablet (10 mg total) by mouth every 6 (six) hours as needed. 10/03/15   Lloyd Huger, MD  sucralfate (CARAFATE) 1 g tablet Take 1 tablet (1 g total) by mouth 2 (two) times daily. Patient not taking: Reported on 10/06/2015 09/03/15   Noreene Filbert, MD  verapamil (CALAN) 40 MG tablet Take 1 tablet (40 mg total) by mouth 2 (two) times daily. 11/28/15   Hillary Bow, MD  Vitamin D, Cholecalciferol, 400 UNITS CAPS Take 400 mg by mouth 2 (two) times daily. Reported on 07/31/2015    Historical Provider, MD     Allergies No known allergies  Family History  Problem Relation Age of Onset  . Hypercholesterolemia Mother   . Hypertension Father     Social History Social History  Substance Use Topics  . Smoking status: Never Smoker  . Smokeless tobacco: Never Used  . Alcohol use No    Review of Systems  Constitutional:Positive fever  Cardiovascular: Denies chest pain. Respiratory: Positive shortness of breath and cough Gastrointestinal: No abdominal pain.   Genitourinary: Negative for dysuria. Musculoskeletal: Negative for back pain. Skin: Negative for rash. Neurological: Negative for headaches  10-point ROS otherwise negative.  ____________________________________________   PHYSICAL EXAM:  VITAL SIGNS: ED Triage Vitals  Enc Vitals Group     BP --      Pulse  Rate 12/01/15 2026 (!) 128     Resp 12/01/15 2026 16     Temp 12/01/15 2026 (!) 102.4 F (39.1 C)     Temp Source 12/01/15 2026 Oral     SpO2 12/01/15 2026 94 %     Weight 12/01/15 2027 120 lb (54.4 kg)     Height 12/01/15 2027 _0  (1.575 m)     Head Circumference --      Peak Flow --      Pain Score --      Pain Loc --      Pain Edu? --      Excl. in [place]? --     Constitutional: Alert and oriented. No acute distress Eyes: Conjunctivae are normal.  Head: Atraumatic.  Nose: No  congestion/rhinnorhea. Mouth/Throat: Mucous membranes are moist.   Neck:  Painless ROM, no meningismus Cardiovascular: Tachycardia, regular rhythm. Grossly normal heart sounds.  Good peripheral circulation. Port noted no evidence of infection, left dialysis catheter clean dry, no surrounding erythema or tenderness Respiratory: Mild tachypnea..  No retractions. Bibasilar rales Gastrointestinal: Soft and nontender. No distention.  No CVA tenderness. Genitourinary: deferred Musculoskeletal: No lower extremity tenderness.  Warm and well perfused Neurologic:  Normal speech and language. No gross focal neurologic deficits are appreciated.  Skin:  Skin is warm, dry and intact. No rash noted. Psychiatric: Mood and affect are normal. Speech and behavior are normal.  ____________________________________________   LABS (all labs ordered are listed, but only abnormal results are displayed)  Labs Reviewed  CULTURE, BLOOD (ROUTINE X 2)  CULTURE, BLOOD (ROUTINE X 2)  URINE CULTURE  LACTIC ACID, PLASMA  LACTIC ACID, PLASMA  COMPREHENSIVE METABOLIC PANEL  CBC WITH DIFFERENTIAL/PLATELET  URINALYSIS COMPLETEWITH MICROSCOPIC ([facility] ONLY)   ____________________________________________  EKG  ED ECG REPORT I, Lavonia Drafts, the attending physician, personally viewed and interpreted this ECG.  Date: 12/01/2015 EKG Time: 10:28 PM Rate: 128 Rhythm: Sinus tachycardia QRS Axis: Left axis deviation Intervals: normal ST/T Wave abnormalities: normal Conduction Disturbances: none   ____________________________________________  RADIOLOGY  CXR improved since last  ____________________________________________   PROCEDURES  Procedure(s) performed: No    Critical Care performed: No ____________________________________________   INITIAL IMPRESSION / ASSESSMENT AND PLAN / ED COURSE  Pertinent labs & imaging results that were available during my care of the patient were reviewed by me and  considered in my medical decision making (see chart for details).  Patient presents with fever, tachycardia, cough, mild hypoxia most concerning for HCAP/sepsis. Code sepsis called. Dialysis catheter does not demonstrate any evidence of infection nor does port. Patient denies dysuria. No abdominal tenderness to palpation, no rash noted.  Clinical Course  CXR does not demonstrate obvious PNA although this is the working dx. ABX given. Patient will be admitted for further work up ____________________________________________   FINAL CLINICAL IMPRESSION(S) / ED DIAGNOSES  Final diagnoses:  Sepsis, due to unspecified organism [facility])      NEW MEDICATIONS STARTED DURING THIS VISIT:  New Prescriptions   No medications on file     Note:  This document was prepared using Dragon voice recognition software and may include unintentional dictation errors.    Lavonia Drafts, MD 12/01/15 2203

## 2015-12-01 NOTE — ED Triage Notes (Signed)
Pt was recently hospitalized for 2 weeks, discharged 9/22 for kidney failure. Pt has hx of multiple myeloma. Pt was at dialysis today, Dr. Gita Kudo seen pt, wanted pt taken to ED for possible pneumonia and fever of 101F. Pts family member, daughter, reports pt has had a persistent dry cough for 3 weeks.

## 2015-12-02 ENCOUNTER — Inpatient Hospital Stay: Payer: Self-pay

## 2015-12-02 LAB — GLUCOSE, CAPILLARY
GLUCOSE-CAPILLARY: 190 mg/dL — AB (ref 65–99)
GLUCOSE-CAPILLARY: 151 mg/dL — AB (ref 65–99)
GLUCOSE-CAPILLARY: 151 mg/dL — AB (ref 65–99)
Glucose-Capillary: 147 mg/dL — ABNORMAL HIGH (ref 65–99)
Glucose-Capillary: 180 mg/dL — ABNORMAL HIGH (ref 65–99)

## 2015-12-02 LAB — BASIC METABOLIC PANEL
ANION GAP: 10 (ref 5–15)
BUN: 40 mg/dL — ABNORMAL HIGH (ref 6–20)
CALCIUM: 7.8 mg/dL — AB (ref 8.9–10.3)
CO2: 27 mmol/L (ref 22–32)
CREATININE: 3.68 mg/dL — AB (ref 0.44–1.00)
Chloride: 102 mmol/L (ref 101–111)
GFR, EST AFRICAN AMERICAN: 15 mL/min — AB (ref >60)
GFR, EST NON AFRICAN AMERICAN: 13 mL/min — AB (ref >60)
GLUCOSE: 146 mg/dL — AB (ref 65–99)
Potassium: 3.3 mmol/L — ABNORMAL LOW (ref 3.5–5.1)
Sodium: 139 mmol/L (ref 135–145)

## 2015-12-02 LAB — CBC
HCT: 19.3 % — ABNORMAL LOW (ref 35.0–47.0)
HEMOGLOBIN: 6.5 g/dL — AB (ref 12.0–16.0)
MCH: 30.4 pg (ref 26.0–34.0)
MCHC: 34 g/dL (ref 32.0–36.0)
MCV: 89.6 fL (ref 80.0–100.0)
PLATELETS: 98 10*3/uL — AB (ref 150–440)
RBC: 2.15 MIL/uL — AB (ref 3.80–5.20)
RDW: 18.7 % — ABNORMAL HIGH (ref 11.5–14.5)
WBC: 3.6 10*3/uL (ref 3.6–11.0)

## 2015-12-02 LAB — HEMOGLOBIN AND HEMATOCRIT, BLOOD
HCT: 26.3 % — ABNORMAL LOW (ref 35.0–47.0)
HEMOGLOBIN: 8.9 g/dL — AB (ref 12.0–16.0)

## 2015-12-02 LAB — PREPARE RBC (CROSSMATCH)

## 2015-12-02 MED ORDER — VERAPAMIL HCL 40 MG PO TABS
40.0000 mg | ORAL_TABLET | Freq: Two times a day (BID) | ORAL | Status: DC
  Administered 2015-12-02 – 2015-12-06 (×6): 40 mg via ORAL
  Filled 2015-12-02 (×12): qty 1

## 2015-12-02 MED ORDER — VANCOMYCIN HCL IN DEXTROSE 1-5 GM/200ML-% IV SOLN: 1000.0000 mg | INTRAVENOUS | Status: DC

## 2015-12-02 MED ORDER — OXYCODONE HCL 5 MG PO TABS: 10.0000 mg | ORAL_TABLET | Freq: Four times a day (QID) | ORAL | Status: DC | PRN

## 2015-12-02 MED ORDER — SODIUM CHLORIDE 0.9 % IV SOLN: INTRAVENOUS | Status: DC

## 2015-12-02 MED ORDER — SODIUM CHLORIDE 0.9 % IV SOLN
INTRAVENOUS | Status: DC
Start: 2015-12-02 — End: 2015-12-02

## 2015-12-02 MED ORDER — ACETAMINOPHEN 650 MG RE SUPP: 650.0000 mg | Freq: Four times a day (QID) | RECTAL | Status: DC | PRN

## 2015-12-02 MED ORDER — ONDANSETRON HCL 4 MG PO TABS: 4.0000 mg | ORAL_TABLET | Freq: Four times a day (QID) | ORAL | Status: DC | PRN

## 2015-12-02 MED ORDER — SODIUM CHLORIDE 0.9 % IV SOLN: INTRAVENOUS | Status: AC

## 2015-12-02 MED ORDER — BENZONATATE 100 MG PO CAPS
200.0000 mg | ORAL_CAPSULE | Freq: Three times a day (TID) | ORAL | Status: DC | PRN
  Administered 2015-12-02 – 2015-12-07 (×8): 200 mg via ORAL
  Filled 2015-12-02 (×8): qty 2

## 2015-12-02 MED ORDER — ACETAMINOPHEN 325 MG PO TABS
650.0000 mg | ORAL_TABLET | Freq: Four times a day (QID) | ORAL | Status: DC | PRN
  Administered 2015-12-02: 650 mg via ORAL
  Filled 2015-12-02: qty 2

## 2015-12-02 MED ORDER — FERROUS SULFATE 325 (65 FE) MG PO TABS
325.0000 mg | ORAL_TABLET | Freq: Every day | ORAL | Status: DC
  Administered 2015-12-03 – 2015-12-07 (×4): 325 mg via ORAL
  Filled 2015-12-02 (×4): qty 1

## 2015-12-02 MED ORDER — HEPARIN SODIUM (PORCINE) 5000 UNIT/ML IJ SOLN
5000.0000 [IU] | Freq: Three times a day (TID) | INTRAMUSCULAR | Status: DC
  Administered 2015-12-02 – 2015-12-08 (×17): 5000 [IU] via SUBCUTANEOUS
  Filled 2015-12-02 (×17): qty 1

## 2015-12-02 MED ORDER — INSULIN ASPART 100 UNIT/ML ~~LOC~~ SOLN
0.0000 [IU] | Freq: Every day | SUBCUTANEOUS | Status: DC
  Administered 2015-12-04 – 2015-12-05 (×2): 2 [IU] via SUBCUTANEOUS
  Filled 2015-12-02 (×2): qty 2

## 2015-12-02 MED ORDER — VANCOMYCIN HCL 500 MG IV SOLR
500.0000 mg | INTRAVENOUS | Status: DC
  Administered 2015-12-03: 500 mg via INTRAVENOUS
  Filled 2015-12-02 (×3): qty 500

## 2015-12-02 MED ORDER — GUAIFENESIN-DM 100-10 MG/5ML PO SYRP
5.0000 mL | ORAL_SOLUTION | ORAL | Status: DC | PRN
  Administered 2015-12-02 (×2): 5 mL via ORAL
  Filled 2015-12-02 (×2): qty 5

## 2015-12-02 MED ORDER — DEXTROSE 5 % IV SOLN
1.0000 g | INTRAVENOUS | Status: DC
  Administered 2015-12-02 – 2015-12-04 (×3): 1 g via INTRAVENOUS
  Filled 2015-12-02 (×4): qty 1

## 2015-12-02 MED ORDER — VANCOMYCIN HCL IN DEXTROSE 750-5 MG/150ML-% IV SOLN: 750.0000 mg | INTRAVENOUS | Status: DC

## 2015-12-02 MED ORDER — SODIUM CHLORIDE 0.9 % IV SOLN
Freq: Once | INTRAVENOUS | Status: AC
  Administered 2015-12-02: 15:00:00 via INTRAVENOUS

## 2015-12-02 MED ORDER — GABAPENTIN 300 MG PO CAPS
300.0000 mg | ORAL_CAPSULE | Freq: Three times a day (TID) | ORAL | Status: DC
  Administered 2015-12-02 – 2015-12-07 (×14): 300 mg via ORAL
  Filled 2015-12-02 (×14): qty 1

## 2015-12-02 MED ORDER — ONDANSETRON HCL 4 MG/2ML IJ SOLN
4.0000 mg | Freq: Four times a day (QID) | INTRAMUSCULAR | Status: DC | PRN
  Administered 2015-12-02 – 2015-12-03 (×3): 4 mg via INTRAVENOUS
  Filled 2015-12-02 (×3): qty 2

## 2015-12-02 MED ORDER — IPRATROPIUM-ALBUTEROL 0.5-2.5 (3) MG/3ML IN SOLN: 3.0000 mL | Freq: Four times a day (QID) | RESPIRATORY_TRACT | Status: DC | PRN

## 2015-12-02 MED ORDER — DEXTROSE 5 % IV SOLN
2.0000 g | INTRAVENOUS | Status: DC
  Filled 2015-12-02: qty 2

## 2015-12-02 MED ORDER — INSULIN ASPART 100 UNIT/ML ~~LOC~~ SOLN
0.0000 [IU] | Freq: Three times a day (TID) | SUBCUTANEOUS | Status: DC
  Administered 2015-12-02: 2 [IU] via SUBCUTANEOUS
  Administered 2015-12-02: 1 [IU] via SUBCUTANEOUS
  Administered 2015-12-02: 2 [IU] via SUBCUTANEOUS
  Administered 2015-12-03: 1 [IU] via SUBCUTANEOUS
  Administered 2015-12-03 – 2015-12-05 (×4): 2 [IU] via SUBCUTANEOUS
  Administered 2015-12-06: 1 [IU] via SUBCUTANEOUS
  Administered 2015-12-06 – 2015-12-07 (×2): 2 [IU] via SUBCUTANEOUS
  Administered 2015-12-07 – 2015-12-08 (×3): 1 [IU] via SUBCUTANEOUS
  Filled 2015-12-02 (×7): qty 2
  Filled 2015-12-02 (×3): qty 1
  Filled 2015-12-02 (×2): qty 2
  Filled 2015-12-02: qty 1
  Filled 2015-12-02 (×2): qty 2
  Filled 2015-12-02 (×2): qty 1

## 2015-12-02 MED ORDER — SODIUM CHLORIDE 0.9 % IV SOLN
INTRAVENOUS | Status: AC
  Administered 2015-12-02: 02:00:00 via INTRAVENOUS

## 2015-12-02 MED ORDER — FUROSEMIDE 80 MG PO TABS
80.0000 mg | ORAL_TABLET | Freq: Every day | ORAL | Status: DC
  Administered 2015-12-02 – 2015-12-07 (×4): 80 mg via ORAL
  Filled 2015-12-02 (×4): qty 1

## 2015-12-02 MED ORDER — GUAIFENESIN-CODEINE 100-10 MG/5ML PO SOLN
10.0000 mL | ORAL | Status: DC | PRN
  Administered 2015-12-02 – 2015-12-07 (×11): 10 mL via ORAL
  Filled 2015-12-02 (×11): qty 10

## 2015-12-02 MED ORDER — IPRATROPIUM-ALBUTEROL 0.5-2.5 (3) MG/3ML IN SOLN
3.0000 mL | RESPIRATORY_TRACT | Status: DC | PRN
  Administered 2015-12-04: 3 mL via RESPIRATORY_TRACT
  Filled 2015-12-02: qty 3

## 2015-12-02 MED ORDER — VANCOMYCIN HCL 500 MG IV SOLR
500.0000 mg | INTRAVENOUS | Status: DC | PRN
  Filled 2015-12-02: qty 500

## 2015-12-02 NOTE — Progress Notes (Addendum)
Pharmacy Antibiotic Note  Jamie Keller is a 50 y.o. female admitted on 12/01/2015 with pneumonia.  Pharmacy has been consulted for cefepime and vancomycin dosing. Pt is on intermittent HD. nephrology consult pending.   Plan: Pt received 1g of vancomycin in the ED. This is sufficient for a load. Vancomycin 500mg  q HD. Awaiting nephrology note for plan.  Cefepime 1g q 24 hours for now. Once schedule is known, could change to 2g q HD.  Height: 5\' 2"  (157.5 cm) Weight: 108 lb 14.4 oz (49.4 kg) IBW/kg (Calculated) : 50.1  Temp (24hrs), Avg:100.6 F (38.1 C), Min:98.4 F (36.9 C), Max:102.4 F (39.1 C)   Recent Labs Lab 11/27/15 0517 11/27/15 1005 12/01/15 2042 12/02/15 0416  WBC 7.6  --  5.5 3.6  CREATININE  --  5.04* 3.01* 3.68*  LATICACIDVEN  --   --  1.8  --     Estimated Creatinine Clearance: 14.1 mL/min (by C-G formula based on SCr of 3.68 mg/dL (H)).    Allergies  Allergen Reactions  . No Known Allergies    Thank you for allowing pharmacy to be a part of this patient's care.  Ramond Dial, Pharm.D. Clinical Pharmacist 12/02/2015 8:15 AM

## 2015-12-02 NOTE — Progress Notes (Signed)
RBC Blood transfusion started at 1520 on 12/02/2015. VS prior were stable. Pt is resting comfortably. Will monitor pt closely.   Angus Seller

## 2015-12-02 NOTE — Care Management (Signed)
Patient is Spanish speaking, self pay patient who was recently discharge on new outpatient HD.  Readmitted for sepsis. PCP=Adrian Macheno. Pharmacy=[facility]. Mrs [name] denies having any home assistive equipment, home oxygen, or home health services. Patient was established at [facility].  [place]  Tuesdays. Thursdays, Saturdays 11:20am.  Patient lives at home with her husband and children.  Depends on husband and daughter for transportation.  Provided patient with information on [facility] transportation previous admission, as well as [place], and Medication management. I have notified Alda Lea Patient pathway liaison.  I have faxed H&P, and updated nephrology note.  Was called by [name] from financial services with patients Medicaid ID # [id number] o.

## 2015-12-02 NOTE — Progress Notes (Addendum)
Blood transfusion is completed at 1829 VSS. Pt is resting comfortably.   Angus Seller

## 2015-12-02 NOTE — Progress Notes (Signed)
[place], is a 50 y.o. female, DOB - 1965-03-18, VOZ:[id number]  Admit date - 12/01/2015   Admitting Physician Lance Coon, MD  Outpatient Primary MD for the patient is [name], MD   LOS - 1  Subjective: Patient was recently discharged from the hospital with acute respiratory failure and acute renal failure who has been getting dialyzed as outpatient presents with shortness of breath and cough denies any chest pain or palpitations    Review of Systems:   CONSTITUTIONAL: Positive fever. No fatigue, weakness. No weight gain, no weight loss.  EYES: No blurry or double vision.  ENT: No tinnitus. No postnasal drip. No redness of the oropharynx.  RESPIRATORY: Positive cough, no wheeze, no hemoptysis. No dyspnea.  CARDIOVASCULAR: No chest pain. No orthopnea. No palpitations. No syncope.  GASTROINTESTINAL: No nausea, no vomiting or diarrhea. + abdominal pain. No melena or hematochezia.  GENITOURINARY: No dysuria or hematuria.  ENDOCRINE: No polyuria or nocturia. No heat or cold intolerance.  HEMATOLOGY: No anemia. No bruising. No bleeding.  INTEGUMENTARY: No rashes. No lesions.  MUSCULOSKELETAL: No arthritis. No swelling. No gout.  NEUROLOGIC: No numbness, tingling, or ataxia. No seizure-type activity.  PSYCHIATRIC: No anxiety. No insomnia. No ADD.    Vitals:   Vitals:   12/02/15 0203 12/02/15 0551 12/02/15 1002 12/02/15 1210  BP:  (!) 110/50 (!) 129/54 130/60  Pulse:  98 (!) 102 89  Resp:  17  16  Temp:  98.4 F (36.9 C)  98.6 F (37 C)  TempSrc:  Oral  Oral  SpO2:  96%  97%  Weight: 108 lb 14.4 oz (49.4 kg)     Height:        Wt Readings from Last 3 Encounters:  12/02/15 108 lb 14.4 oz  (49.4 kg)  11/28/15 112 lb 4.8 oz (50.9 kg)  10/27/15 115 lb 4.8 oz (52.3 kg)     Intake/Output Summary (Last 24 hours) at 12/02/15 1234 Last data filed at 12/02/15 1215  Gross per 24 hour  Intake              864 ml  Output              200 ml  Net              664 ml    Physical  Exam:   GENERAL: Pleasant-appearing in no apparent distress.  HEAD, EYES, EARS, NOSE AND THROAT: Atraumatic, normocephalic. Extraocular muscles are intact. Pupils equal and reactive to light. Sclerae anicteric. No conjunctival injection. No oro-pharyngeal erythema.  NECK: Supple. There is no jugular venous distention. No bruits, no lymphadenopathy, no thyromegaly.  HEART: Regular rate and rhythm,. No murmurs, no rubs, no clicks.  LUNGS: Clear to auscultation bilaterally. No rales or rhonchi. No wheezes.  ABDOMEN: Soft, flat, nontender, nondistended. Has good bowel sounds. No hepatosplenomegaly appreciated.  EXTREMITIES: No evidence of any cyanosis, clubbing, or peripheral edema.  +2 pedal and radial pulses bilaterally.  NEUROLOGIC: The patient is alert, awake, and oriented x3 with no focal motor or sensory deficits appreciated bilaterally.  SKIN: Moist and warm with no rashes appreciated.  Psych: Not anxious, depressed LN: No inguinal LN enlargement    Antibiotics   Anti-infectives    Start     Dose/Rate Route Frequency Ordered Stop   12/03/15 0900  vancomycin (VANCOCIN) IVPB 1000 mg/200 mL premix  Status:  Discontinued     1,000 mg 200 mL/hr over 60 Minutes Intravenous Every 48 hours 12/02/15 0110 12/02/15 0815   12/03/15 0900  vancomycin (VANCOCIN) IVPB 750 mg/150 ml premix  Status:  Discontinued     750 mg 150 mL/hr over 60 Minutes Intravenous Every 48 hours 12/02/15 0815 12/02/15 0844   12/02/15 2200  ceFEPIme (MAXIPIME) 2 g in dextrose 5 % 50 mL IVPB  Status:  Discontinued     2 g 100 mL/hr over 30 Minutes Intravenous Every 24 hours 12/02/15 0110 12/02/15 0829   12/02/15 2200  ceFEPIme  (MAXIPIME) 1 g in dextrose 5 % 50 mL IVPB     1 g 100 mL/hr over 30 Minutes Intravenous Every 24 hours 12/02/15 0829     12/02/15 0843  vancomycin (VANCOCIN) 500 mg in sodium chloride 0.9 % 100 mL IVPB     500 mg 100 mL/hr over 60 Minutes Intravenous Every Dialysis 12/02/15 0844     12/01/15 2045  ceFEPIme (MAXIPIME) 2 g in dextrose 5 % 50 mL IVPB     2 g 100 mL/hr over 30 Minutes Intravenous  Once 12/01/15 2032 12/01/15 2139   12/01/15 2045  vancomycin (VANCOCIN) IVPB 1000 mg/200 mL premix     1,000 mg 200 mL/hr over 60 Minutes Intravenous  Once 12/01/15 2032 12/01/15 2202      Medications   Scheduled Meds: . sodium chloride   Intravenous Once  . ceFEPime (MAXIPIME) IV  1 g Intravenous Q24H  . heparin  5,000 Units Subcutaneous Q8H  . insulin aspart  0-5 Units Subcutaneous QHS  . insulin aspart  0-9 Units Subcutaneous TID WC  . verapamil  40 mg Oral BID   Continuous Infusions: . sodium chloride     PRN Meds:.acetaminophen **OR** acetaminophen, guaiFENesin-dextromethorphan, ipratropium-albuterol, ondansetron **OR** ondansetron (ZOFRAN) IV, vancomycin   Data Review:   Micro Results Recent Results (from the past 240 hour(s))  Blood Culture (routine x 2)     Status: None (Preliminary result)   Collection Time: 12/01/15  8:42 PM  Result Value Ref Range Status   Specimen Description BLOOD LEFT HAND  Final   Special Requests BOTTLES DRAWN AEROBIC AND ANAEROBIC 5ML  Final   Culture NO GROWTH < 24 HOURS  Final   Report Status PENDING  Incomplete  Blood Culture (routine x 2)     Status: None (Preliminary result)   Collection Time: 12/01/15  8:42 PM  Result Value Ref  Range Status   Specimen Description BLOOD PORTA CATH  Final   Special Requests BOTTLES DRAWN AEROBIC AND ANAEROBIC 7ML  Final   Culture NO GROWTH < 24 HOURS  Final   Report Status PENDING  Incomplete    Radiology Reports Ct Abdomen Pelvis Wo Contrast  Result Date: 11/14/2015 CLINICAL DATA:  Acute onset of  generalized abdominal distention. Generalized weakness and near syncope. Shortness of breath. Current history of multiple myeloma. Chills and vomiting. Initial encounter. EXAM: CT ABDOMEN AND PELVIS WITHOUT CONTRAST TECHNIQUE: Multidetector CT imaging of the abdomen and pelvis was performed following the standard protocol without IV contrast. COMPARISON:  CT of the abdomen and pelvis performed 09/02/2015 FINDINGS: Lower chest: Small bilateral pleural effusions are noted, right greater than left, with bibasilar airspace opacities, possibly reflecting atelectasis or pneumonia. Hepatobiliary: The liver is unremarkable in appearance. Trace ascites is seen tracking about the liver and spleen. The gallbladder is grossly unremarkable in appearance. The common bile duct remains normal in caliber. Pancreas: Vague soft tissue inflammation is suggested about the pancreas, with fluid tracking inferiorly along the duodenum and Gerota's fascia on the right, concerning for acute pancreatitis. Evaluation for devascularization is limited without contrast. No pseudocysts are seen. Spleen: The spleen is unremarkable in appearance. Adrenals/Urinary Tract: The adrenal glands are grossly unremarkable. Nonspecific perinephric stranding is noted bilaterally. There is no evidence of hydronephrosis. No renal or ureteral stones are identified. Stomach/Bowel: The stomach is grossly unremarkable in appearance. No significant small bowel abnormalities are seen. The appendix is normal in caliber, without evidence of appendicitis. The colon is decompressed and grossly unremarkable in appearance. Vascular/Lymphatic: The vasculature is not well assessed without contrast. Minimal vascular calcification is seen. No retroperitoneal lymphadenopathy is seen. No pelvic sidewall lymphadenopathy is identified. Reproductive: The bladder is mildly distended and grossly unremarkable. The uterus is grossly unremarkable in appearance. The ovaries are relatively  symmetric. No suspicious adnexal masses are seen. Other: A small amount of fluid within the pelvis. Presacral stranding is noted. Musculoskeletal: There is diffuse sclerosis and numerous lytic lesions throughout the visualized osseous structures, with chronic loss of height noted at T11. The visualized musculature is grossly unremarkable in appearance. IMPRESSION: 1. Vague soft tissue inflammation about the pancreas, with fluid tracking inferiorly along the duodenum and Gerota's fascia on the right, concerning for acute pancreatitis. Would correlate with pancreatic lab values. No pseudocyst seen. Evaluation for devascularization is limited without contrast. 2. Trace ascites noted within the abdomen and pelvis. 3. Small bilateral pleural effusions, right greater than left, with bibasilar airspace opacities possibly reflecting atelectasis or pneumonia. 4. Diffuse sclerosis and numerous lytic lesions throughout the visualized osseous structures, with chronic loss of height at T11. This reflects the patient's known multiple myeloma. Electronically Signed   By: Garald Balding M.D.   On: 11/14/2015 02:32   Dg Chest 1 View  Result Date: 11/13/2015 CLINICAL DATA:  Pallor. Abdominal pain and shortness of breath. Multiple myeloma. EXAM: CHEST 1 VIEW COMPARISON:  09/22/2015 FINDINGS: Power injectable right IJ Port-A-Cath tip: SVC. Mild enlargement of the cardiopericardial silhouette with bibasilar airspace opacities obscuring the hemidiaphragms. Old bilateral lateral lower rib fractures. Thoracic spondylosis. IMPRESSION: 1. Obscuration of both hemidiaphragms is increased from 09/22/2015 and may reflect atelectasis or pneumonia. The underlying pleural effusions are not readily excluded either. 2. Mild enlargement of the cardiopericardial silhouette. Electronically Signed   By: Van Clines M.D.   On: 11/13/2015 23:07   Dg Chest 2 View  Result Date: 11/21/2015 CLINICAL DATA:  50 year old  with end-stage renal disease  on hemodialysis presenting with generalized weakness, cough and shortness of breath. Follow-up bilateral pleural effusions. EXAM: CHEST  2 VIEW COMPARISON:  11/20/2015, 11/13/2015 and earlier, including CT chest 09/02/2015 and earlier. FINDINGS: Cardiac silhouette markedly enlarged. Pulmonary venous hypertension and mild diffuse interstitial pulmonary edema, increased since yesterday. Bilateral pleural effusions, left greater than right, with associated consolidation the lower lobes, slightly increased since yesterday. Left jugular dialysis catheter tips in the right atrium, unchanged. Right jugular Port-A-Cath tip at or near the cavoatrial junction, unchanged. Visualized bony thorax intact. Old compression fracture of the upper endplate of O96, unchanged. IMPRESSION: Worsening CHF and/or fluid overload, with increased interstitial pulmonary edema and enlarging bilateral pleural effusions since yesterday. Worsening associated passive atelectasis and/or pneumonia involving the lower lobes, left greater than right. Electronically Signed   By: Evangeline Dakin M.D.   On: 11/21/2015 15:59   Dg Chest 2 View  Result Date: 11/20/2015 CLINICAL DATA:  Abdominal pain.  TB. EXAM: CHEST  2 VIEW COMPARISON:  11/13/2015 FINDINGS: Cardiopericardial enlargement that is stable. New dialysis catheter from the left with tips at the right atrium. Small pleural effusions and bibasilar atelectasis, increased. No septal thickening to suggest edema. History of multiple myeloma with spinal sclerosis and chronic T11 compression fracture IMPRESSION: Increased pleural effusions and bibasilar atelectasis. Cannot exclude superimposed pneumonia. Electronically Signed   By: Monte Fantasia M.D.   On: 11/20/2015 14:01   Nm Pulmonary Perf And Vent  Result Date: 11/21/2015 CLINICAL DATA:  Subacute onset of dyspnea and shortness of breath. Initial encounter. EXAM: NUCLEAR MEDICINE VENTILATION - PERFUSION LUNG SCAN TECHNIQUE: Ventilation images  were obtained in multiple projections using inhaled aerosol Tc-58mDTPA. Perfusion images were obtained in multiple projections after intravenous injection of Tc-91mAA. RADIOPHARMACEUTICALS:  29.814 mCi Technetium-9935mPA aerosol inhalation and 2.565 mCi Technetium-3m73m IV COMPARISON:  Chest radiograph performed earlier today at 3:44 p.m. FINDINGS: Ventilation: Diffuse heterogeneity is noted with regard to ventilation within both lungs, with numerous small peripheral defects seen bilaterally. No large segmental ventilation defects are identified. Perfusion: There is minimal peripheral heterogeneity of perfusion, without a focal defect to suggest pulmonary embolus. Perfusion of both lungs is much more normal in appearance than corresponding ventilation. IMPRESSION: Low probability for pulmonary embolus. Scattered small peripheral ventilation defects, without perfusion abnormality. Electronically Signed   By: JeffGarald Balding.   On: 11/21/2015 19:59   Dg Chest Port 1 View  Result Date: 12/01/2015 CLINICAL DATA:  Fever.  Dialysis patient. EXAM: PORTABLE CHEST 1 VIEW COMPARISON:  11/21/2015 FINDINGS: Improved aeration with decrease in pulmonary edema. Improved aeration in the lung bases with decreased atelectasis/infiltrate. Improvement in small bilateral pleural effusions Left jugular dialysis catheter in the right atrium unchanged. Port-A-Cath tip in the SVC unchanged. IMPRESSION: Interval improvement in pulmonary edema. Improvement in bibasilar airspace disease and small bilateral effusions. Electronically Signed   By: CharFranchot Gallo.   On: 12/01/2015 21:44     CBC  Recent Labs Lab 11/27/15 0517 12/01/15 2042 12/02/15 0416  WBC 7.6 5.5 3.6  HGB 8.3* 7.9* 6.5*  HCT 24.2* 22.9* 19.3*  PLT 121* 126* 98*  MCV 90.4 88.2 89.6  MCH 31.0 30.3 30.4  MCHC 34.3 34.3 34.0  RDW 18.6* 18.2* 18.7*  LYMPHSABS  --  0.6*  --   MONOABS  --  0.3  --   EOSABS  --  0.1  --   BASOSABS  --  0.0  --  Chemistries   Recent Labs Lab 11/27/15 1005 12/01/15 2042 12/02/15 0416  NA 137 139 139  K 4.5 3.3* 3.3*  CL 104 98* 102  CO2 20* 27 27  GLUCOSE 224* 176* 146*  BUN 61* 33* 40*  CREATININE 5.04* 3.01* 3.68*  CALCIUM 9.0 8.7* 7.8*  AST  --  142*  --   ALT  --  48  --   ALKPHOS  --  104  --   BILITOT  --  1.4*  --    ------------------------------------------------------------------------------------------------------------------ estimated creatinine clearance is 14.1 mL/min (by C-G formula based on SCr of 3.68 mg/dL (H)). ------------------------------------------------------------------------------------------------------------------ No results for input(s): HGBA1C in the last 72 hours. ------------------------------------------------------------------------------------------------------------------ No results for input(s): CHOL, HDL, LDLCALC, TRIG, CHOLHDL, LDLDIRECT in the last 72 hours. ------------------------------------------------------------------------------------------------------------------ No results for input(s): TSH, T4TOTAL, T3FREE, THYROIDAB in the last 72 hours.  Invalid input(s): FREET3 ------------------------------------------------------------------------------------------------------------------ No results for input(s): VITAMINB12, FOLATE, FERRITIN, TIBC, IRON, RETICCTPCT in the last 72 hours.  Coagulation profile No results for input(s): INR, PROTIME in the last 168 hours.  No results for input(s): DDIMER in the last 72 hours.  Cardiac Enzymes No results for input(s): CKMB, TROPONINI, MYOGLOBIN in the last 168 hours.  Invalid input(s): CK ------------------------------------------------------------------------------------------------------------------ Invalid input(s): [place]  Patient is a 50 year old with multiple myeloma presenting with sepsis and pneumonia #1 Sepsis ([place]) - continue IV cefepime and vancomycin #2  multiple myeloma with worsening anemia I have discussed with the patient regarding transfusion she is agreeable risk and benefits explained  #3     End stage renal failure on dialysis [facility]) - nephrology consult for dialysis support  #4  HTN (hypertension) - stable, continue home meds #5   Depression - continue home meds #6  multiple myeloma prognosis poor oncology input     Code Status Orders        Start     Ordered   12/02/15 0111  Full code  Continuous     12/02/15 0110    Code Status History    Date Active Date Inactive Code Status Order ID Comments User Context   11/14/2015 12:52 AM 11/17/2015  1:12 PM Full Code [id number]  Holley Raring, NP ED   11/14/2015 12:06 AM 11/14/2015 12:12 AM Full Code [id number]  Holley Raring, NP ED           Consults  ONCOLOGY   DVT Prophylaxis   heparin  Lab Results  Component Value Date   PLT 98 (L) 12/02/2015     Time Spent in minutes   35MIN  Greater than 50% of time spent in care coordination and counseling patient regarding the condition and plan of care.   Dustin Flock M.D on 12/02/2015 at 12:34 PM  Between 7am to 6pm - Pager - [phone]  After 6pm go to [email or web address] - password [email or web address] [place] [facility]   Office  [phone]

## 2015-12-02 NOTE — Progress Notes (Signed)
[facility]  ROUNDING NOTE   Subjective:  We saw the patient has an outpatient yesterday during dialysis. At that point in time she was tachycardic and short of breath. The clinical impression at that time was that she may have underlying pneumonia. CT chest was performed in appears to have an atypical pneumonia pattern. Patient completed hemodialysis yesterday.  Objective:  Vital signs in last 24 hours:  Temp:  [98.4 F (36.9 C)-102.4 F (39.1 C)] 98.6 F (37 C) (09/26 1210) Pulse Rate:  [89-128] 116 (09/26 1505) Resp:  [16-28] 18 (09/26 1505) BP: (110-158)/(49-65) 127/49 (09/26 1505) SpO2:  [93 %-98 %] 98 % (09/26 1505) Weight:  [49.4 kg (108 lb 14.4 oz)-54.4 kg (120 lb)] 49.4 kg (108 lb 14.4 oz) (09/26 0203)  Weight change:  Filed Weights   12/01/15 2027 12/02/15 0203  Weight: 54.4 kg (120 lb) 49.4 kg (108 lb 14.4 oz)    Intake/Output: I/O last 3 completed shifts: In: 296 [P.O.:60; I.V.:236] Out: 0    Intake/Output this shift:  Total I/O In: 568 [P.O.:240; I.V.:328] Out: 200 [Urine:200]  Physical Exam: General: No acute distress  Head: Normocephalic, atraumatic. Moist oral mucosal membranes  Eyes: Anicteric  Neck: Supple, trachea midline  Lungs:  Bilateral rales, normal effort  Heart: S1S2 no rubs  Abdomen:  Soft, non tender, BS present  Extremities: Trace peripheral edema.  Neurologic: Nonfocal, moving all four extremities  Skin: No lesions  Access: [facility] permcath 9/13 Dr. Lucky Cowboy, rt IJ port    Basic Metabolic Panel:  Recent Labs Lab 11/27/15 1005 12/01/15 2042 12/02/15 0416  NA 137 139 139  K 4.5 3.3* 3.3*  CL 104 98* 102  CO2 20* 27 27  GLUCOSE 224* 176* 146*  BUN 61* 33* 40*  CREATININE 5.04* 3.01* 3.68*  CALCIUM 9.0 8.7* 7.8*  PHOS 4.5  --   --     Liver Function Tests:  Recent Labs Lab 11/27/15 1005 12/01/15 2042  AST  --  142*  ALT  --  48  ALKPHOS  --  104  BILITOT  --  1.4*  PROT  --  7.6  ALBUMIN 2.9* 3.2*   No  results for input(s): LIPASE, AMYLASE in the last 168 hours. No results for input(s): AMMONIA in the last 168 hours.  CBC:  Recent Labs Lab 11/27/15 0517 12/01/15 2042 12/02/15 0416  WBC 7.6 5.5 3.6  NEUTROABS  --  4.5  --   HGB 8.3* 7.9* 6.5*  HCT 24.2* 22.9* 19.3*  MCV 90.4 88.2 89.6  PLT 121* 126* 98*    Cardiac Enzymes: No results for input(s): CKTOTAL, CKMB, CKMBINDEX, TROPONINI in the last 168 hours.  BNP: Invalid input(s): POCBNP  CBG:  Recent Labs Lab 11/28/15 0740 11/28/15 1129 12/02/15 0132 12/02/15 0727 12/02/15 1127  GLUCAP 131* 165* 190* 147* 180*    Microbiology: Results for orders placed or performed during the hospital encounter of 12/01/15  Blood Culture (routine x 2)     Status: None (Preliminary result)   Collection Time: 12/01/15  8:42 PM  Result Value Ref Range Status   Specimen Description BLOOD LEFT HAND  Final   Special Requests BOTTLES DRAWN AEROBIC AND ANAEROBIC 5ML  Final   Culture NO GROWTH < 24 HOURS  Final   Report Status PENDING  Incomplete  Blood Culture (routine x 2)     Status: None (Preliminary result)   Collection Time: 12/01/15  8:42 PM  Result Value Ref Range Status   Specimen Description BLOOD  PORTA CATH  Final   Special Requests BOTTLES DRAWN AEROBIC AND ANAEROBIC 7ML  Final   Culture NO GROWTH < 24 HOURS  Final   Report Status PENDING  Incomplete    Coagulation Studies: No results for input(s): LABPROT, INR in the last 72 hours.  Urinalysis:  Recent Labs  12/01/15 2042  COLORURINE STRAW*  LABSPEC 1.008  PHURINE 9.0*  GLUCOSEU 150*  HGBUR 2+*  BILIRUBINUR NEGATIVE  KETONESUR NEGATIVE  PROTEINUR 100*  NITRITE NEGATIVE  LEUKOCYTESUR NEGATIVE      Imaging: Ct Chest Wo Contrast  Result Date: 12/02/2015 CLINICAL DATA:  Hospitalized for 2 weeks for kidney failure, discharge 11/28/2015. History of multiple myeloma. Persistent dry cough for 3 weeks. Productive cough beginning yesterday. EXAM: CT CHEST  WITHOUT CONTRAST TECHNIQUE: Multidetector CT imaging of the chest was performed following the standard protocol without IV contrast. COMPARISON:  Chest CT dated 09/02/2015. Chest x-ray dated 12/01/2015. FINDINGS: Cardiovascular: Heart size is upper normal, stable. No pericardial effusion. Thoracic aorta is normal in caliber. Mediastinum/Nodes: Scattered small lymph nodes appear stable. No masses or enlarged lymph nodes appreciated in the mediastinum or perihilar regions. Esophagus appears normal. Trachea and central bronchi are unremarkable. Lungs/Pleura: Small nodular consolidations and areas of interstitial thickening and/or ground-glass opacity within the central perihilar lungs bilaterally, new compared to the earlier chest CT. More peripheral lungs are clear. Small bilateral pleural effusions with adjacent atelectasis. Upper Abdomen: Limited images of the upper abdomen are unremarkable. Musculoskeletal: The infiltrative paraspinal mass described in the left lower thoracic spine (T9-T10 level) on the previous chest contrast-enhanced CT is difficult to visualize on this noncontrast exam. Again noted are mixed lytic and sclerotic changes throughout the entire skeleton compatible with previous description of diffuse myeloma. Again noted is an increase in the sclerotic change which may be secondary to interval treatment. There are, however, increase lytic changes also noted in the lower thoracic spine and upper lumbar spine suspicious for progression of disease. IMPRESSION: 1. Scattered small nodular consolidations with interposed areas of ground-glass opacity within the central perihilar lungs bilaterally, new compared to the earlier chest CT of 09/02/2015. This is highly concerning for atypical pneumonia including fungal, pneumocystis or viral (especially if currently immunocompromised). Less likely, this could represent perihilar edema related to volume overload. Neoplastic process cannot be confidently excluded  but is also considered less likely given the bilaterality. 2. Diffuse osseous metastases related to patient's known multiple myeloma, overall similar to the previous exam of 09/02/2015. Some increased lytic changes within the lower thoracic spine and upper lumbar spine is suspicious for progression of disease. Areas of increased sclerotic change throughout the skeleton may be secondary to interval treatment. 3. Small bilateral pleural effusions with adjacent atelectasis. Electronically Signed   By: Franki Cabot M.D.   On: 12/02/2015 14:34   Dg Chest Port 1 View  Result Date: 12/01/2015 CLINICAL DATA:  Fever.  Dialysis patient. EXAM: PORTABLE CHEST 1 VIEW COMPARISON:  11/21/2015 FINDINGS: Improved aeration with decrease in pulmonary edema. Improved aeration in the lung bases with decreased atelectasis/infiltrate. Improvement in small bilateral pleural effusions Left jugular dialysis catheter in the right atrium unchanged. Port-A-Cath tip in the SVC unchanged. IMPRESSION: Interval improvement in pulmonary edema. Improvement in bibasilar airspace disease and small bilateral effusions. Electronically Signed   By: Franchot Gallo M.D.   On: 12/01/2015 21:44     Medications:   . sodium chloride     . sodium chloride   Intravenous Once  . ceFEPime (MAXIPIME) IV  1 g Intravenous Q24H  . [START ON 12/03/2015] ferrous sulfate  325 mg Oral Q breakfast  . furosemide  80 mg Oral Daily  . gabapentin  300 mg Oral TID  . heparin  5,000 Units Subcutaneous Q8H  . insulin aspart  0-5 Units Subcutaneous QHS  . insulin aspart  0-9 Units Subcutaneous TID WC  . [START ON 12/03/2015] vancomycin  500 mg Intravenous Q M,W,F-HD  . verapamil  40 mg Oral BID   acetaminophen **OR** acetaminophen, guaiFENesin-dextromethorphan, ipratropium-albuterol, ondansetron **OR** ondansetron (ZOFRAN) IV, oxyCODONE  Assessment/ Plan:  50 y.o.hispanic female with diabetes mellitus type 2, hypertension, multiple myeloma on chemotherapy,  osteoarthritis, chronic kidney disease stage III   1. Acute renal failure on CKD stage III baseline Cr 1.4 from 8/28/217.  Concern for progression of myeloma kidney. Nonoliguric urine output. - 24 hour for creatinine clearance 7.   - at this point in time the patient remains dialysis dependent.  She had her first outpatient hemodialysis yesterday.  We will plan for dialysis again tomorrow.    2. 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 With thrombocytopenia.  - Oncology: Dr. Grayland Ormond -  Hemoglobin has dropped again to 6.5.  Blood transfusion planned.  3. Multiple Myeloma:  Patient appears to have had progression of her underlying multiple myeloma.  Await further input from Dr. Grayland Ormond.  4.  Pneumonia, bacterial vs fungal.  J18.9.  Patient found to have atypical pattern of pneumonitis on chest CT.  She has underlying multiple myeloma and can be considered as being immunosuppressed.     LOS: 1 LATEEF, MUNSOOR 9/26/20173:10 PM

## 2015-12-03 DIAGNOSIS — N179 Acute kidney failure, unspecified: Secondary | ICD-10-CM

## 2015-12-03 DIAGNOSIS — Z79899 Other long term (current) drug therapy: Secondary | ICD-10-CM

## 2015-12-03 DIAGNOSIS — A419 Sepsis, unspecified organism: Principal | ICD-10-CM

## 2015-12-03 DIAGNOSIS — I129 Hypertensive chronic kidney disease with stage 1 through stage 4 chronic kidney disease, or unspecified chronic kidney disease: Secondary | ICD-10-CM

## 2015-12-03 DIAGNOSIS — J189 Pneumonia, unspecified organism: Secondary | ICD-10-CM

## 2015-12-03 DIAGNOSIS — C9000 Multiple myeloma not having achieved remission: Secondary | ICD-10-CM

## 2015-12-03 DIAGNOSIS — R5383 Other fatigue: Secondary | ICD-10-CM

## 2015-12-03 DIAGNOSIS — M1990 Unspecified osteoarthritis, unspecified site: Secondary | ICD-10-CM

## 2015-12-03 DIAGNOSIS — R531 Weakness: Secondary | ICD-10-CM

## 2015-12-03 DIAGNOSIS — N186 End stage renal disease: Secondary | ICD-10-CM

## 2015-12-03 DIAGNOSIS — D649 Anemia, unspecified: Secondary | ICD-10-CM

## 2015-12-03 DIAGNOSIS — Z794 Long term (current) use of insulin: Secondary | ICD-10-CM

## 2015-12-03 DIAGNOSIS — E119 Type 2 diabetes mellitus without complications: Secondary | ICD-10-CM

## 2015-12-03 LAB — TYPE AND SCREEN
ABO/RH(D): B POS
Antibody Screen: NEGATIVE
Unit division: 0

## 2015-12-03 LAB — CBC
HEMATOCRIT: 25.5 % — AB (ref 35.0–47.0)
Hemoglobin: 8.7 g/dL — ABNORMAL LOW (ref 12.0–16.0)
MCH: 29.9 pg (ref 26.0–34.0)
MCHC: 34.1 g/dL (ref 32.0–36.0)
MCV: 87.7 fL (ref 80.0–100.0)
PLATELETS: 106 10*3/uL — AB (ref 150–440)
RBC: 2.9 MIL/uL — AB (ref 3.80–5.20)
RDW: 17.7 % — ABNORMAL HIGH (ref 11.5–14.5)
WBC: 4 10*3/uL (ref 3.6–11.0)

## 2015-12-03 LAB — GLUCOSE, CAPILLARY
GLUCOSE-CAPILLARY: 111 mg/dL — AB (ref 65–99)
GLUCOSE-CAPILLARY: 136 mg/dL — AB (ref 65–99)
Glucose-Capillary: 138 mg/dL — ABNORMAL HIGH (ref 65–99)
Glucose-Capillary: 182 mg/dL — ABNORMAL HIGH (ref 65–99)

## 2015-12-03 LAB — URINE CULTURE: Culture: <10000 — AB

## 2015-12-03 LAB — BASIC METABOLIC PANEL
ANION GAP: 14 (ref 5–15)
BUN: 52 mg/dL — ABNORMAL HIGH (ref 6–20)
CO2: 20 mmol/L — ABNORMAL LOW (ref 22–32)
Calcium: 8.2 mg/dL — ABNORMAL LOW (ref 8.9–10.3)
Chloride: 102 mmol/L (ref 101–111)
Creatinine, Ser: 4.61 mg/dL — ABNORMAL HIGH (ref 0.44–1.00)
GFR calc Af Amer: 12 mL/min — ABNORMAL LOW (ref >60)
GFR, EST NON AFRICAN AMERICAN: 10 mL/min — AB (ref >60)
GLUCOSE: 160 mg/dL — AB (ref 65–99)
POTASSIUM: 3.5 mmol/L (ref 3.5–5.1)
Sodium: 136 mmol/L (ref 135–145)

## 2015-12-03 LAB — PHOSPHORUS: PHOSPHORUS: 4.9 mg/dL — AB (ref 2.5–4.6)

## 2015-12-03 MED ORDER — LEVOFLOXACIN IN D5W 750 MG/150ML IV SOLN
750.0000 mg | INTRAVENOUS | Status: DC
  Filled 2015-12-03: qty 150

## 2015-12-03 MED ORDER — LEVOFLOXACIN IN D5W 750 MG/150ML IV SOLN
750.0000 mg | Freq: Once | INTRAVENOUS | Status: AC
  Administered 2015-12-03: 750 mg via INTRAVENOUS
  Filled 2015-12-03: qty 150

## 2015-12-03 MED ORDER — LEVOFLOXACIN IN D5W 500 MG/100ML IV SOLN
500.0000 mg | INTRAVENOUS | Status: DC
  Administered 2015-12-07: 500 mg via INTRAVENOUS
  Filled 2015-12-03 (×2): qty 100

## 2015-12-03 MED ORDER — EPOETIN ALFA 10000 UNIT/ML IJ SOLN
10000.0000 [IU] | INTRAMUSCULAR | Status: DC
  Administered 2015-12-05 – 2015-12-08 (×2): 10000 [IU] via INTRAVENOUS
  Filled 2015-12-03 (×2): qty 1

## 2015-12-03 NOTE — Progress Notes (Signed)
Dialysis Completed without issue

## 2015-12-03 NOTE — Consult Note (Signed)
[facility]     Reason for Consult: Immunocompromised, PNA    Referring Physician:  Valentino Nose Date of Admission:  12/01/2015   Principal Problem:   Sepsis ([place]) Active Problems:   Multiple myeloma ([place])   HCAP (healthcare-associated pneumonia)   End stage renal failure on dialysis ([place])   HTN (hypertension)   Depression   HPI: Lorianne Ranae Palms is a 50 y.o. female immunocompromised from MM and chemo with velemid admitted with 5-6 days progressive dry cough and fevers. She has had some wt loss as well. Denies night sweats. Cough is nonproductive.  CT shows diffuse scattered nodular densities and ground glass opacities. She has recently developed ARF from progressive MM and is on HD. Has a HD cath in chest and a port in place. Recently had neg PPD done a few weeks ago. Originally from [place] but in [place] for 18 years. Had a sick contact in a friends son with ear infection. No other unusual exposures. Denies hx TB.    Past Medical History:  Diagnosis Date  . Diabetes mellitus without complication ([place])   . Hypertension   . Multiple myeloma ([place])   . Osteoarthritis   . Post-menopausal 2014   Past Surgical History:  Procedure Laterality Date  . CESAREAN SECTION     x2  . PERIPHERAL VASCULAR CATHETERIZATION N/A 11/19/2015   Procedure: Dialysis/Perma Catheter Insertion;  Surgeon: Algernon Huxley, MD;  Location: [facility];  Service: Cardiovascular;  Laterality: N/A;   Social History  Substance Use Topics  . Smoking status: Never Smoker  . Smokeless tobacco: Never Used  . Alcohol use No   Family History  Problem Relation Age of Onset  . Hypercholesterolemia Mother   . Hypertension Father     Allergies:  Allergies  Allergen Reactions  . No Known Allergies     Current antibiotics: Antibiotics Given (last 72 hours)    Date/Time Action Medication Dose Rate   12/02/15 2303 Given   ceFEPIme (MAXIPIME) 1 g in dextrose 5 % 50 mL IVPB 1 g 100 mL/hr    12/03/15 1159 Given   vancomycin (VANCOCIN) 500 mg in sodium chloride 0.9 % 100 mL IVPB 500 mg 100 mL/hr      MEDICATIONS: . ceFEPime (MAXIPIME) IV  1 g Intravenous Q24H  . [START ON 12/05/2015] epoetin (EPOGEN/PROCRIT) injection  10,000 Units Intravenous Q M,W,F-HD  . ferrous sulfate  325 mg Oral Q breakfast  . furosemide  80 mg Oral Daily  . gabapentin  300 mg Oral TID  . heparin  5,000 Units Subcutaneous Q8H  . insulin aspart  0-5 Units Subcutaneous QHS  . insulin aspart  0-9 Units Subcutaneous TID WC  . vancomycin  500 mg Intravenous Q M,W,F-HD  . verapamil  40 mg Oral BID    Review of Systems - 11 systems reviewed and negative per HPI   OBJECTIVE: Temp:  [99 F (37.2 C)-99.5 F (37.5 C)] 99.5 F (37.5 C) (09/27 1316) Pulse Rate:  [102-119] 118 (09/27 1316) Resp:  [17-27] 17 (09/27 1316) BP: (108-169)/(46-63) 148/53 (09/27 1316) SpO2:  [96 %-100 %] 97 % (09/27 1316) Weight:  [49.4 kg (108 lb 14.5 oz)-51.3 kg (113 lb 1.5 oz)] 50.1 kg (110 lb 8 oz) (09/27 1316) Physical Exam  Constitutional:  oriented to person, place, and time. Thin, in mild resp distress, has repeated dry cough HENT: [place]/AT, PERRLA, no scleral icterus Mouth/Throat: Oropharynx is clear and moist. No oropharyngeal exudate.  Cardiovascular: Normal rate, regular  rhythm and normal heart sounds.  Pulmonary/Chest: Poor air movement throughout Neck supple, no nuchal rigidity Abdominal: Soft. Bowel sounds are normal.  exhibits no distension. There is no tenderness.  Lymphadenopathy: no cervical adenopathy. No axillary adenopathy Neurological: alert and oriented to person, place, and time.  Skin: Skin is warm and dry. No rash noted. No erythema.  Psychiatric: a normal mood and affect.  behavior is normal.   LABS: Results for orders placed or performed during the hospital encounter of 12/01/15 (from the past 48 hour(s))  Lactic acid, plasma     Status: None   Collection Time: 12/01/15  8:42 PM  Result Value  Ref Range   Lactic Acid, Venous 1.8 0.5 - 1.9 mmol/L  Comprehensive metabolic panel     Status: Abnormal   Collection Time: 12/01/15  8:42 PM  Result Value Ref Range   Sodium 139 135 - 145 mmol/L   Potassium 3.3 (L) 3.5 - 5.1 mmol/L   Chloride 98 (L) 101 - 111 mmol/L   CO2 27 22 - 32 mmol/L   Glucose, Bld 176 (H) 65 - 99 mg/dL   BUN 33 (H) 6 - 20 mg/dL   Creatinine, Ser 3.01 (H) 0.44 - 1.00 mg/dL   Calcium 8.7 (L) 8.9 - 10.3 mg/dL   Total Protein 7.6 6.5 - 8.1 g/dL   Albumin 3.2 (L) 3.5 - 5.0 g/dL   AST 142 (H) 15 - 41 U/L   ALT 48 14 - 54 U/L   Alkaline Phosphatase 104 38 - 126 U/L   Total Bilirubin 1.4 (H) 0.3 - 1.2 mg/dL   GFR calc non Af Amer 17 (L) >60 mL/min   GFR calc Af Amer 20 (L) >60 mL/min    Comment: (NOTE) The eGFR has been calculated using the CKD EPI equation. This calculation has not been validated in all clinical situations. eGFR's persistently <60 mL/min signify possible Chronic Kidney Disease.    Anion gap 14 5 - 15  CBC WITH DIFFERENTIAL     Status: Abnormal   Collection Time: 12/01/15  8:42 PM  Result Value Ref Range   WBC 5.5 3.6 - 11.0 K/uL   RBC 2.60 (L) 3.80 - 5.20 MIL/uL   Hemoglobin 7.9 (L) 12.0 - 16.0 g/dL   HCT 22.9 (L) 35.0 - 47.0 %   MCV 88.2 80.0 - 100.0 fL   MCH 30.3 26.0 - 34.0 pg   MCHC 34.3 32.0 - 36.0 g/dL   RDW 18.2 (H) 11.5 - 14.5 %   Platelets 126 (L) 150 - 440 K/uL   Neutrophils Relative % 78 %   Lymphocytes Relative 10 %   Monocytes Relative 5 %   Eosinophils Relative 1 %   Basophils Relative 0 %   Band Neutrophils 4 %   Metamyelocytes Relative 2 %   Myelocytes 0 %   Promyelocytes Absolute 0 %   Blasts 0 %   nRBC 0 0 /100 WBC   Other 0 %   Neutro Abs 4.5 1.4 - 6.5 K/uL   Lymphs Abs 0.6 (L) 1.0 - 3.6 K/uL   Monocytes Absolute 0.3 0.2 - 0.9 K/uL   Eosinophils Absolute 0.1 0 - 0.7 K/uL   Basophils Absolute 0.0 0 - 0.1 K/uL   RBC Morphology MIXED RBC POPULATION     Comment: HYPOCHROMIA   Smear Review LARGE PLATELETS  PRESENT   Blood Culture (routine x 2)     Status: None (Preliminary result)   Collection Time: 12/01/15  8:42 PM  Result  Value Ref Range   Specimen Description BLOOD LEFT HAND    Special Requests BOTTLES DRAWN AEROBIC AND ANAEROBIC 5ML    Culture NO GROWTH 2 DAYS    Report Status PENDING   Blood Culture (routine x 2)     Status: None (Preliminary result)   Collection Time: 12/01/15  8:42 PM  Result Value Ref Range   Specimen Description BLOOD PORTA CATH    Special Requests BOTTLES DRAWN AEROBIC AND ANAEROBIC 7ML    Culture NO GROWTH 2 DAYS    Report Status PENDING   Urine culture     Status: Abnormal   Collection Time: 12/01/15  8:42 PM  Result Value Ref Range   Specimen Description URINE, RANDOM    Special Requests NONE    Culture (A)     <10,000 COLONIES/mL INSIGNIFICANT GROWTH Performed at [facility]    Report Status 12/03/2015 FINAL   Urinalysis complete, with microscopic ([place] only)     Status: Abnormal   Collection Time: 12/01/15  8:42 PM  Result Value Ref Range   Color, Urine STRAW (A) YELLOW   APPearance CLEAR (A) CLEAR   Glucose, UA 150 (A) NEGATIVE mg/dL   Bilirubin Urine NEGATIVE NEGATIVE   Ketones, ur NEGATIVE NEGATIVE mg/dL   Specific Gravity, Urine 1.008 1.005 - 1.030   Hgb urine dipstick 2+ (A) NEGATIVE   pH 9.0 (H) 5.0 - 8.0   Protein, ur 100 (A) NEGATIVE mg/dL   Nitrite NEGATIVE NEGATIVE   Leukocytes, UA NEGATIVE NEGATIVE   RBC / HPF 0-5 0 - 5 RBC/hpf   WBC, UA 0-5 0 - 5 WBC/hpf   Bacteria, UA NONE SEEN NONE SEEN   Squamous Epithelial / LPF 0-5 (A) NONE SEEN   Mucous PRESENT   Glucose, capillary     Status: Abnormal   Collection Time: 12/02/15  1:32 AM  Result Value Ref Range   Glucose-Capillary 190 (H) 65 - 99 mg/dL   Comment 1 Notify RN   Basic metabolic panel     Status: Abnormal   Collection Time: 12/02/15  4:16 AM  Result Value Ref Range   Sodium 139 135 - 145 mmol/L   Potassium 3.3 (L) 3.5 - 5.1 mmol/L   Chloride 102 101 - 111  mmol/L   CO2 27 22 - 32 mmol/L   Glucose, Bld 146 (H) 65 - 99 mg/dL   BUN 40 (H) 6 - 20 mg/dL   Creatinine, Ser 3.68 (H) 0.44 - 1.00 mg/dL   Calcium 7.8 (L) 8.9 - 10.3 mg/dL   GFR calc non Af Amer 13 (L) >60 mL/min   GFR calc Af Amer 15 (L) >60 mL/min    Comment: (NOTE) The eGFR has been calculated using the CKD EPI equation. This calculation has not been validated in all clinical situations. eGFR's persistently <60 mL/min signify possible Chronic Kidney Disease.    Anion gap 10 5 - 15  CBC     Status: Abnormal   Collection Time: 12/02/15  4:16 AM  Result Value Ref Range   WBC 3.6 3.6 - 11.0 K/uL   RBC 2.15 (L) 3.80 - 5.20 MIL/uL   Hemoglobin 6.5 (L) 12.0 - 16.0 g/dL   HCT 19.3 (L) 35.0 - 47.0 %   MCV 89.6 80.0 - 100.0 fL   MCH 30.4 26.0 - 34.0 pg   MCHC 34.0 32.0 - 36.0 g/dL   RDW 18.7 (H) 11.5 - 14.5 %   Platelets 98 (L) 150 - 440 K/uL  Glucose, capillary  Status: Abnormal   Collection Time: 12/02/15  7:27 AM  Result Value Ref Range   Glucose-Capillary 147 (H) 65 - 99 mg/dL  Glucose, capillary     Status: Abnormal   Collection Time: 12/02/15 11:27 AM  Result Value Ref Range   Glucose-Capillary 180 (H) 65 - 99 mg/dL  Type and screen [place]     Status: None   Collection Time: 12/02/15  1:10 PM  Result Value Ref Range   ABO/RH(D) B POS    Antibody Screen NEG    Sample Expiration 12/05/2015    Unit Number [id number]    Blood Component Type RBC, LR IRR    Unit division 00    Status of Unit ISSUED,FINAL    Transfusion Status OK TO TRANSFUSE    Crossmatch Result Compatible   Prepare RBC     Status: None   Collection Time: 12/02/15  1:11 PM  Result Value Ref Range   Order Confirmation ORDER PROCESSED BY BLOOD BANK   Glucose, capillary     Status: Abnormal   Collection Time: 12/02/15  4:27 PM  Result Value Ref Range   Glucose-Capillary 151 (H) 65 - 99 mg/dL  Hemoglobin and hematocrit, blood     Status: Abnormal   Collection Time:  12/02/15  7:09 PM  Result Value Ref Range   Hemoglobin 8.9 (L) 12.0 - 16.0 g/dL   HCT 26.3 (L) 35.0 - 47.0 %  Glucose, capillary     Status: Abnormal   Collection Time: 12/02/15  9:14 PM  Result Value Ref Range   Glucose-Capillary 151 (H) 65 - 99 mg/dL  CBC     Status: Abnormal   Collection Time: 12/03/15  5:50 AM  Result Value Ref Range   WBC 4.0 3.6 - 11.0 K/uL   RBC 2.90 (L) 3.80 - 5.20 MIL/uL   Hemoglobin 8.7 (L) 12.0 - 16.0 g/dL   HCT 25.5 (L) 35.0 - 47.0 %   MCV 87.7 80.0 - 100.0 fL   MCH 29.9 26.0 - 34.0 pg   MCHC 34.1 32.0 - 36.0 g/dL   RDW 17.7 (H) 11.5 - 14.5 %   Platelets 106 (L) 150 - 440 K/uL  Basic metabolic panel     Status: Abnormal   Collection Time: 12/03/15  5:50 AM  Result Value Ref Range   Sodium 136 135 - 145 mmol/L   Potassium 3.5 3.5 - 5.1 mmol/L   Chloride 102 101 - 111 mmol/L   CO2 20 (L) 22 - 32 mmol/L   Glucose, Bld 160 (H) 65 - 99 mg/dL   BUN 52 (H) 6 - 20 mg/dL   Creatinine, Ser 4.61 (H) 0.44 - 1.00 mg/dL   Calcium 8.2 (L) 8.9 - 10.3 mg/dL   GFR calc non Af Amer 10 (L) >60 mL/min   GFR calc Af Amer 12 (L) >60 mL/min    Comment: (NOTE) The eGFR has been calculated using the CKD EPI equation. This calculation has not been validated in all clinical situations. eGFR's persistently <60 mL/min signify possible Chronic Kidney Disease.    Anion gap 14 5 - 15  Phosphorus     Status: Abnormal   Collection Time: 12/03/15  5:50 AM  Result Value Ref Range   Phosphorus 4.9 (H) 2.5 - 4.6 mg/dL  Glucose, capillary     Status: Abnormal   Collection Time: 12/03/15  7:29 AM  Result Value Ref Range   Glucose-Capillary 138 (H) 65 - 99 mg/dL   Comment 1 Notify  RN   Glucose, capillary     Status: Abnormal   Collection Time: 12/03/15  1:17 PM  Result Value Ref Range   Glucose-Capillary 111 (H) 65 - 99 mg/dL   Comment 1 Notify RN    No components found for: ESR, C REACTIVE PROTEIN MICRO: Recent Results (from the past 720 hour(s))  MRSA PCR Screening      Status: None   Collection Time: 11/14/15  3:11 AM  Result Value Ref Range Status   MRSA by PCR NEGATIVE NEGATIVE Final    Comment:        The GeneXpert MRSA Assay (FDA approved for NASAL specimens only), is one component of a comprehensive MRSA colonization surveillance program. It is not intended to diagnose MRSA infection nor to guide or monitor treatment for MRSA infections.   Blood Culture (routine x 2)     Status: None (Preliminary result)   Collection Time: 12/01/15  8:42 PM  Result Value Ref Range Status   Specimen Description BLOOD LEFT HAND  Final   Special Requests BOTTLES DRAWN AEROBIC AND ANAEROBIC 5ML  Final   Culture NO GROWTH 2 DAYS  Final   Report Status PENDING  Incomplete  Blood Culture (routine x 2)     Status: None (Preliminary result)   Collection Time: 12/01/15  8:42 PM  Result Value Ref Range Status   Specimen Description BLOOD PORTA CATH  Final   Special Requests BOTTLES DRAWN AEROBIC AND ANAEROBIC 7ML  Final   Culture NO GROWTH 2 DAYS  Final   Report Status PENDING  Incomplete  Urine culture     Status: Abnormal   Collection Time: 12/01/15  8:42 PM  Result Value Ref Range Status   Specimen Description URINE, RANDOM  Final   Special Requests NONE  Final   Culture (A)  Final    <10,000 COLONIES/mL INSIGNIFICANT GROWTH Performed at [facility]    Report Status 12/03/2015 FINAL  Final    IMAGING: Ct Abdomen Pelvis Wo Contrast  Result Date: 11/14/2015 CLINICAL DATA:  Acute onset of generalized abdominal distention. Generalized weakness and near syncope. Shortness of breath. Current history of multiple myeloma. Chills and vomiting. Initial encounter. EXAM: CT ABDOMEN AND PELVIS WITHOUT CONTRAST TECHNIQUE: Multidetector CT imaging of the abdomen and pelvis was performed following the standard protocol without IV contrast. COMPARISON:  CT of the abdomen and pelvis performed 09/02/2015 FINDINGS: Lower chest: Small bilateral pleural effusions are  noted, right greater than left, with bibasilar airspace opacities, possibly reflecting atelectasis or pneumonia. Hepatobiliary: The liver is unremarkable in appearance. Trace ascites is seen tracking about the liver and spleen. The gallbladder is grossly unremarkable in appearance. The common bile duct remains normal in caliber. Pancreas: Vague soft tissue inflammation is suggested about the pancreas, with fluid tracking inferiorly along the duodenum and Gerota's fascia on the right, concerning for acute pancreatitis. Evaluation for devascularization is limited without contrast. No pseudocysts are seen. Spleen: The spleen is unremarkable in appearance. Adrenals/Urinary Tract: The adrenal glands are grossly unremarkable. Nonspecific perinephric stranding is noted bilaterally. There is no evidence of hydronephrosis. No renal or ureteral stones are identified. Stomach/Bowel: The stomach is grossly unremarkable in appearance. No significant small bowel abnormalities are seen. The appendix is normal in caliber, without evidence of appendicitis. The colon is decompressed and grossly unremarkable in appearance. Vascular/Lymphatic: The vasculature is not well assessed without contrast. Minimal vascular calcification is seen. No retroperitoneal lymphadenopathy is seen. No pelvic sidewall lymphadenopathy is identified. Reproductive: The bladder is  mildly distended and grossly unremarkable. The uterus is grossly unremarkable in appearance. The ovaries are relatively symmetric. No suspicious adnexal masses are seen. Other: A small amount of fluid within the pelvis. Presacral stranding is noted. Musculoskeletal: There is diffuse sclerosis and numerous lytic lesions throughout the visualized osseous structures, with chronic loss of height noted at T11. The visualized musculature is grossly unremarkable in appearance. IMPRESSION: 1. Vague soft tissue inflammation about the pancreas, with fluid tracking inferiorly along the  duodenum and Gerota's fascia on the right, concerning for acute pancreatitis. Would correlate with pancreatic lab values. No pseudocyst seen. Evaluation for devascularization is limited without contrast. 2. Trace ascites noted within the abdomen and pelvis. 3. Small bilateral pleural effusions, right greater than left, with bibasilar airspace opacities possibly reflecting atelectasis or pneumonia. 4. Diffuse sclerosis and numerous lytic lesions throughout the visualized osseous structures, with chronic loss of height at T11. This reflects the patient's known multiple myeloma. Electronically Signed   By: Garald Balding M.D.   On: 11/14/2015 02:32   Dg Chest 1 View  Result Date: 11/13/2015 CLINICAL DATA:  Pallor. Abdominal pain and shortness of breath. Multiple myeloma. EXAM: CHEST 1 VIEW COMPARISON:  09/22/2015 FINDINGS: Power injectable right IJ Port-A-Cath tip: SVC. Mild enlargement of the cardiopericardial silhouette with bibasilar airspace opacities obscuring the hemidiaphragms. Old bilateral lateral lower rib fractures. Thoracic spondylosis. IMPRESSION: 1. Obscuration of both hemidiaphragms is increased from 09/22/2015 and may reflect atelectasis or pneumonia. The underlying pleural effusions are not readily excluded either. 2. Mild enlargement of the cardiopericardial silhouette. Electronically Signed   By: Van Clines M.D.   On: 11/13/2015 23:07   Dg Chest 2 View  Result Date: 11/21/2015 CLINICAL DATA:  50 year old with end-stage renal disease on hemodialysis presenting with generalized weakness, cough and shortness of breath. Follow-up bilateral pleural effusions. EXAM: CHEST  2 VIEW COMPARISON:  11/20/2015, 11/13/2015 and earlier, including CT chest 09/02/2015 and earlier. FINDINGS: Cardiac silhouette markedly enlarged. Pulmonary venous hypertension and mild diffuse interstitial pulmonary edema, increased since yesterday. Bilateral pleural effusions, left greater than right, with associated  consolidation the lower lobes, slightly increased since yesterday. Left jugular dialysis catheter tips in the right atrium, unchanged. Right jugular Port-A-Cath tip at or near the cavoatrial junction, unchanged. Visualized bony thorax intact. Old compression fracture of the upper endplate of G92, unchanged. IMPRESSION: Worsening CHF and/or fluid overload, with increased interstitial pulmonary edema and enlarging bilateral pleural effusions since yesterday. Worsening associated passive atelectasis and/or pneumonia involving the lower lobes, left greater than right. Electronically Signed   By: Evangeline Dakin M.D.   On: 11/21/2015 15:59   Dg Chest 2 View  Result Date: 11/20/2015 CLINICAL DATA:  Abdominal pain.  TB. EXAM: CHEST  2 VIEW COMPARISON:  11/13/2015 FINDINGS: Cardiopericardial enlargement that is stable. New dialysis catheter from the left with tips at the right atrium. Small pleural effusions and bibasilar atelectasis, increased. No septal thickening to suggest edema. History of multiple myeloma with spinal sclerosis and chronic T11 compression fracture IMPRESSION: Increased pleural effusions and bibasilar atelectasis. Cannot exclude superimposed pneumonia. Electronically Signed   By: Monte Fantasia M.D.   On: 11/20/2015 14:01   Ct Chest Wo Contrast  Result Date: 12/02/2015 CLINICAL DATA:  Hospitalized for 2 weeks for kidney failure, discharge 11/28/2015. History of multiple myeloma. Persistent dry cough for 3 weeks. Productive cough beginning yesterday. EXAM: CT CHEST WITHOUT CONTRAST TECHNIQUE: Multidetector CT imaging of the chest was performed following the standard protocol without IV contrast. COMPARISON:  Chest CT  dated 09/02/2015. Chest x-ray dated 12/01/2015. FINDINGS: Cardiovascular: Heart size is upper normal, stable. No pericardial effusion. Thoracic aorta is normal in caliber. Mediastinum/Nodes: Scattered small lymph nodes appear stable. No masses or enlarged lymph nodes appreciated in  the mediastinum or perihilar regions. Esophagus appears normal. Trachea and central bronchi are unremarkable. Lungs/Pleura: Small nodular consolidations and areas of interstitial thickening and/or ground-glass opacity within the central perihilar lungs bilaterally, new compared to the earlier chest CT. More peripheral lungs are clear. Small bilateral pleural effusions with adjacent atelectasis. Upper Abdomen: Limited images of the upper abdomen are unremarkable. Musculoskeletal: The infiltrative paraspinal mass described in the left lower thoracic spine (T9-T10 level) on the previous chest contrast-enhanced CT is difficult to visualize on this noncontrast exam. Again noted are mixed lytic and sclerotic changes throughout the entire skeleton compatible with previous description of diffuse myeloma. Again noted is an increase in the sclerotic change which may be secondary to interval treatment. There are, however, increase lytic changes also noted in the lower thoracic spine and upper lumbar spine suspicious for progression of disease. IMPRESSION: 1. Scattered small nodular consolidations with interposed areas of ground-glass opacity within the central perihilar lungs bilaterally, new compared to the earlier chest CT of 09/02/2015. This is highly concerning for atypical pneumonia including fungal, pneumocystis or viral (especially if currently immunocompromised). Less likely, this could represent perihilar edema related to volume overload. Neoplastic process cannot be confidently excluded but is also considered less likely given the bilaterality. 2. Diffuse osseous metastases related to patient's known multiple myeloma, overall similar to the previous exam of 09/02/2015. Some increased lytic changes within the lower thoracic spine and upper lumbar spine is suspicious for progression of disease. Areas of increased sclerotic change throughout the skeleton may be secondary to interval treatment. 3. Small bilateral pleural  effusions with adjacent atelectasis. Electronically Signed   By: Franki Cabot M.D.   On: 12/02/2015 14:34   Nm Pulmonary Perf And Vent  Result Date: 11/21/2015 CLINICAL DATA:  Subacute onset of dyspnea and shortness of breath. Initial encounter. EXAM: NUCLEAR MEDICINE VENTILATION - PERFUSION LUNG SCAN TECHNIQUE: Ventilation images were obtained in multiple projections using inhaled aerosol [id number]. Perfusion images were obtained in multiple projections after intravenous injection of [id number]. RADIOPHARMACEUTICALS:  29.814 mCi Technetium-9910mPA aerosol inhalation and 2.565 mCi Technetium-63m68m IV COMPARISON:  Chest radiograph performed earlier today at 3:44 p.m. FINDINGS: Ventilation: Diffuse heterogeneity is noted with regard to ventilation within both lungs, with numerous small peripheral defects seen bilaterally. No large segmental ventilation defects are identified. Perfusion: There is minimal peripheral heterogeneity of perfusion, without a focal defect to suggest pulmonary embolus. Perfusion of both lungs is much more normal in appearance than corresponding ventilation. IMPRESSION: Low probability for pulmonary embolus. Scattered small peripheral ventilation defects, without perfusion abnormality. Electronically Signed   By: JeffGarald Balding.   On: 11/21/2015 19:59   Dg Chest Port 1 View  Result Date: 12/01/2015 CLINICAL DATA:  Fever.  Dialysis patient. EXAM: PORTABLE CHEST 1 VIEW COMPARISON:  11/21/2015 FINDINGS: Improved aeration with decrease in pulmonary edema. Improved aeration in the lung bases with decreased atelectasis/infiltrate. Improvement in small bilateral pleural effusions Left jugular dialysis catheter in the right atrium unchanged. Port-A-Cath tip in the SVC unchanged. IMPRESSION: Interval improvement in pulmonary edema. Improvement in bibasilar airspace disease and small bilateral effusions. Electronically Signed   By: CharFranchot Gallo.   On: 12/01/2015 21:44     Assessment:   SocoAlyla Pietilaa 50  y.o. female immunocompromised patient with MM on Velcade admitted with a week of cough and fevers. She has diffuse nodular findings and Ground glass opacities on CT and a dry cough. Maintaining sats but appears dyspneic.  I am very concerned for a progressive atypical infection given immunocompromise. Differential is broad including atypical bacterial pathogens, fungal (Histo, crypto, blasto, aspergillus), PCP, viral or mycobacterial.  Pulmonary toxicity from chemo agent is also possible given velcade use.   While she has a neg PPD, she is from an endemic area and was noted to have a calcified granuloma on prior imaging so TB remains in the differential although this is a relatively acute presentation. She will need aggressive work up.    Recommendations Cont vanco cefepime Add Levo for atypical coverage Check HIV, fungal serology, crypto ag, urine histo ag, Urine legionella, QFG, mycoplasma and chlamydia serology Pulmonary has been consulted and I would suggest a bronchoscopy ASAP. Would send for afb, fungal and routine cxs, viral cultures and cytology.   May benefit from steroids but would like to r/o infection first.   Thank you very much for allowing me to participate in the care of this patient. Please call with questions.   Cheral Marker. Ola Spurr, MD

## 2015-12-03 NOTE — Progress Notes (Signed)
[place] [facility] [place], is a 50 y.o. female, DOB - [birth year], IOE:[id number]  Admit date - 12/01/2015   Admitting Physician Lance Coon, MD  Outpatient Primary MD for the patient is [name], MD   LOS - 2  Subjective: Still having shortness of breath and cough    Review of Systems:   CONSTITUTIONAL: Positive fever. No fatigue, weakness. No weight gain, no weight loss.  EYES: No blurry or double vision.  ENT: No tinnitus. No postnasal drip. No redness of the oropharynx.  RESPIRATORY: Positive cough, no wheeze, no hemoptysis. Positive dyspnea.  CARDIOVASCULAR: No chest pain. No orthopnea. No palpitations. No syncope.  GASTROINTESTINAL: No nausea, no vomiting or diarrhea. + abdominal pain. No melena or hematochezia.  GENITOURINARY: No dysuria or hematuria.  ENDOCRINE: No polyuria or nocturia. No heat or cold intolerance.  HEMATOLOGY: No anemia. No bruising. No bleeding.  INTEGUMENTARY: No rashes. No lesions.  MUSCULOSKELETAL: No arthritis. No swelling. No gout.  NEUROLOGIC: No numbness, tingling, or ataxia. No seizure-type activity.  PSYCHIATRIC: No anxiety. No insomnia. No ADD.    Vitals:   Vitals:   12/03/15 1000 12/03/15 1030 12/03/15 1100 12/03/15 1130  BP: 129/60 (!) 108/46 (!) 130/54 (!) 140/55  Pulse: (!) 105 (!) 102 (!) 104 (!) 112  Resp: (!) 22 (!) 24 (!) 26 (!) 27  Temp:      TempSrc:      SpO2: 99% 98% 97% 96%  Weight:      Height:        Wt Readings from Last 3 Encounters:  12/03/15 113 lb 1.5 oz (51.3 kg)  11/28/15 112 lb 4.8 oz (50.9 kg)  10/27/15 115 lb 4.8 oz (52.3 kg)     Intake/Output Summary (Last 24 hours) at 12/03/15 1159 Last data filed at 12/03/15 0900  Gross per 24  hour  Intake          1040.23 ml  Output                0 ml  Net          1040.23 ml    Physical Exam:   GENERAL: Pleasant-appearing in no apparent distress.  HEAD, EYES, EARS, NOSE AND THROAT: Atraumatic, normocephalic. Extraocular muscles are intact. Pupils equal and reactive to light. Sclerae anicteric. No conjunctival injection. No oro-pharyngeal erythema.  NECK: Supple. There is no jugular venous distention. No bruits, no lymphadenopathy, no thyromegaly.  HEART: Regular rate and rhythm,. No murmurs, no rubs, no clicks.  LUNGS: Rhonchus breath sounds bilaterally especially in the upper lobes  ABDOMEN: Soft, flat, nontender, nondistended. Has good bowel sounds. No hepatosplenomegaly appreciated.  EXTREMITIES: No evidence of any cyanosis, clubbing, or peripheral edema.  +2 pedal and radial pulses bilaterally.  NEUROLOGIC: The patient is alert, awake, and oriented x3 with no focal motor or sensory deficits appreciated bilaterally.  SKIN: Moist and warm with no rashes appreciated.  Psych: Not anxious, depressed LN: No inguinal LN enlargement    Antibiotics   Anti-infectives    Start     Dose/Rate Route Frequency Ordered Stop   12/03/15 1200  vancomycin (VANCOCIN) 500 mg in sodium chloride 0.9 % 100 mL IVPB     500 mg 100 mL/hr over 60 Minutes Intravenous Every M-W-F (Hemodialysis) 12/02/15 1356     12/03/15 0900  vancomycin (VANCOCIN) IVPB 1000 mg/200 mL premix  Status:  Discontinued     1,000 mg 200 mL/hr over 60 Minutes Intravenous Every 48 hours 12/02/15 0110 12/02/15 0815   12/03/15 0900  vancomycin (VANCOCIN) IVPB 750 mg/150 ml premix  Status:  Discontinued     750 mg 150 mL/hr over 60 Minutes Intravenous Every 48 hours 12/02/15 0815 12/02/15 0844   12/02/15 2200  ceFEPIme (MAXIPIME) 2 g in dextrose 5 % 50 mL IVPB  Status:  Discontinued     2 g 100 mL/hr over 30 Minutes Intravenous Every 24 hours 12/02/15 0110 12/02/15 0829   12/02/15 2200  ceFEPIme (MAXIPIME) 1 g in  dextrose 5 % 50 mL IVPB     1 g 100 mL/hr over 30 Minutes Intravenous Every 24 hours 12/02/15 0829     12/02/15 0843  vancomycin (VANCOCIN) 500 mg in sodium chloride 0.9 % 100 mL IVPB  Status:  Discontinued     500 mg 100 mL/hr over 60 Minutes Intravenous Every Dialysis 12/02/15 0844 12/02/15 1356   12/01/15 2045  ceFEPIme (MAXIPIME) 2 g in dextrose 5 % 50 mL IVPB     2 g 100 mL/hr over 30 Minutes Intravenous  Once 12/01/15 2032 12/01/15 2139   12/01/15 2045  vancomycin (VANCOCIN) IVPB 1000 mg/200 mL premix     1,000 mg 200 mL/hr over 60 Minutes Intravenous  Once 12/01/15 2032 12/01/15 2202      Medications   Scheduled Meds: . ceFEPime (MAXIPIME) IV  1 g Intravenous Q24H  . ferrous sulfate  325 mg Oral Q breakfast  . furosemide  80 mg Oral Daily  . gabapentin  300 mg Oral TID  . heparin  5,000 Units Subcutaneous Q8H  . insulin aspart  0-5 Units Subcutaneous QHS  . insulin aspart  0-9 Units Subcutaneous TID WC  . vancomycin  500 mg Intravenous Q M,W,F-HD  . verapamil  40 mg Oral BID   Continuous Infusions:   PRN Meds:.acetaminophen **OR** acetaminophen, benzonatate, guaiFENesin-codeine, ipratropium-albuterol, ipratropium-albuterol, ondansetron **OR** ondansetron (ZOFRAN) IV, oxyCODONE   Data Review:   Micro Results Recent Results (from the past 240 hour(s))  Blood Culture (routine x 2)     Status: None (Preliminary result)   Collection Time: 12/01/15  8:42 PM  Result Value Ref Range Status   Specimen Description BLOOD LEFT HAND  Final   Special Requests BOTTLES DRAWN AEROBIC AND ANAEROBIC 5ML  Final   Culture NO GROWTH 2 DAYS  Final   Report Status PENDING  Incomplete  Blood Culture (routine x 2)     Status: None (Preliminary result)   Collection Time: 12/01/15  8:42 PM  Result Value Ref Range Status   Specimen Description BLOOD PORTA CATH  Final   Special Requests BOTTLES DRAWN AEROBIC AND ANAEROBIC 7ML  Final   Culture NO GROWTH 2 DAYS  Final   Report Status PENDING   Incomplete  Urine culture     Status: Abnormal   Collection Time: 12/01/15  8:42 PM  Result Value Ref Range Status   Specimen Description URINE, RANDOM  Final   Special Requests NONE  Final   Culture (A)  Final    <10,000 COLONIES/mL INSIGNIFICANT GROWTH Performed at [facility]    Report Status 12/03/2015 FINAL  Final    Radiology Reports Ct Abdomen Pelvis Wo Contrast  Result Date: 11/14/2015 CLINICAL DATA:  Acute onset of generalized abdominal distention. Generalized weakness and near syncope. Shortness of breath. Current history of multiple myeloma. Chills and vomiting. Initial encounter. EXAM: CT ABDOMEN AND PELVIS WITHOUT CONTRAST TECHNIQUE: Multidetector CT imaging of the abdomen and pelvis was performed following the standard protocol without IV contrast. COMPARISON:  CT of the abdomen and pelvis performed 09/02/2015 FINDINGS: Lower chest: Small bilateral pleural effusions are noted, right greater than left, with bibasilar airspace opacities, possibly reflecting atelectasis or pneumonia. Hepatobiliary: The liver is unremarkable in appearance. Trace ascites is seen tracking about the liver and spleen. The gallbladder is grossly unremarkable in appearance. The common bile duct remains normal in caliber. Pancreas: Vague soft tissue inflammation is suggested about the pancreas, with fluid tracking inferiorly along the duodenum and Gerota's fascia on the right, concerning for acute pancreatitis. Evaluation for devascularization is limited without contrast. No pseudocysts are seen. Spleen: The spleen is unremarkable in appearance. Adrenals/Urinary Tract: The adrenal glands are grossly unremarkable. Nonspecific perinephric stranding is noted bilaterally. There is no evidence of hydronephrosis. No renal or ureteral stones are identified. Stomach/Bowel: The stomach is grossly unremarkable in appearance. No significant small bowel abnormalities are seen. The appendix is normal in caliber,  without evidence of appendicitis. The colon is decompressed and grossly unremarkable in appearance. Vascular/Lymphatic: The vasculature is not well assessed without contrast. Minimal vascular calcification is seen. No retroperitoneal lymphadenopathy is seen. No pelvic sidewall lymphadenopathy is identified. Reproductive: The bladder is mildly distended and grossly unremarkable. The uterus is grossly unremarkable in appearance. The ovaries are relatively symmetric. No suspicious adnexal masses are seen. Other: A small amount of fluid within the pelvis. Presacral stranding is noted. Musculoskeletal: There is diffuse sclerosis and numerous lytic lesions throughout the visualized osseous structures, with chronic loss of height noted at T11. The visualized musculature is grossly unremarkable in appearance. IMPRESSION: 1. Vague soft tissue inflammation about the pancreas, with fluid tracking inferiorly along the duodenum and Gerota's fascia on the right, concerning for acute pancreatitis. Would correlate with pancreatic lab values. No pseudocyst seen. Evaluation for devascularization is limited without contrast. 2. Trace ascites noted within the abdomen and pelvis. 3. Small bilateral pleural effusions, right greater than left, with bibasilar airspace opacities possibly reflecting atelectasis or pneumonia. 4. Diffuse sclerosis and numerous lytic lesions throughout the visualized osseous structures, with chronic loss of height at T11. This reflects the patient's known multiple myeloma. Electronically Signed   By: Garald Balding M.D.   On: 11/14/2015 02:32   Dg Chest 1 View  Result Date: 11/13/2015 CLINICAL DATA:  Pallor. Abdominal pain and shortness of breath. Multiple myeloma. EXAM: CHEST 1 VIEW COMPARISON:  09/22/2015  FINDINGS: Power injectable right IJ Port-A-Cath tip: SVC. Mild enlargement of the cardiopericardial silhouette with bibasilar airspace opacities obscuring the hemidiaphragms. Old bilateral lateral lower  rib fractures. Thoracic spondylosis. IMPRESSION: 1. Obscuration of both hemidiaphragms is increased from 09/22/2015 and may reflect atelectasis or pneumonia. The underlying pleural effusions are not readily excluded either. 2. Mild enlargement of the cardiopericardial silhouette. Electronically Signed   By: Van Clines M.D.   On: 11/13/2015 23:07   Dg Chest 2 View  Result Date: 11/21/2015 CLINICAL DATA:  50 year old with end-stage renal disease on hemodialysis presenting with generalized weakness, cough and shortness of breath. Follow-up bilateral pleural effusions. EXAM: CHEST  2 VIEW COMPARISON:  11/20/2015, 11/13/2015 and earlier, including CT chest 09/02/2015 and earlier. FINDINGS: Cardiac silhouette markedly enlarged. Pulmonary venous hypertension and mild diffuse interstitial pulmonary edema, increased since yesterday. Bilateral pleural effusions, left greater than right, with associated consolidation the lower lobes, slightly increased since yesterday. Left jugular dialysis catheter tips in the right atrium, unchanged. Right jugular Port-A-Cath tip at or near the cavoatrial junction, unchanged. Visualized bony thorax intact. Old compression fracture of the upper endplate of E26, unchanged. IMPRESSION: Worsening CHF and/or fluid overload, with increased interstitial pulmonary edema and enlarging bilateral pleural effusions since yesterday. Worsening associated passive atelectasis and/or pneumonia involving the lower lobes, left greater than right. Electronically Signed   By: Evangeline Dakin M.D.   On: 11/21/2015 15:59   Dg Chest 2 View  Result Date: 11/20/2015 CLINICAL DATA:  Abdominal pain.  TB. EXAM: CHEST  2 VIEW COMPARISON:  11/13/2015 FINDINGS: Cardiopericardial enlargement that is stable. New dialysis catheter from the left with tips at the right atrium. Small pleural effusions and bibasilar atelectasis, increased. No septal thickening to suggest edema. History of multiple myeloma with  spinal sclerosis and chronic T11 compression fracture IMPRESSION: Increased pleural effusions and bibasilar atelectasis. Cannot exclude superimposed pneumonia. Electronically Signed   By: Monte Fantasia M.D.   On: 11/20/2015 14:01   Ct Chest Wo Contrast  Result Date: 12/02/2015 CLINICAL DATA:  Hospitalized for 2 weeks for kidney failure, discharge 11/28/2015. History of multiple myeloma. Persistent dry cough for 3 weeks. Productive cough beginning yesterday. EXAM: CT CHEST WITHOUT CONTRAST TECHNIQUE: Multidetector CT imaging of the chest was performed following the standard protocol without IV contrast. COMPARISON:  Chest CT dated 09/02/2015. Chest x-ray dated 12/01/2015. FINDINGS: Cardiovascular: Heart size is upper normal, stable. No pericardial effusion. Thoracic aorta is normal in caliber. Mediastinum/Nodes: Scattered small lymph nodes appear stable. No masses or enlarged lymph nodes appreciated in the mediastinum or perihilar regions. Esophagus appears normal. Trachea and central bronchi are unremarkable. Lungs/Pleura: Small nodular consolidations and areas of interstitial thickening and/or ground-glass opacity within the central perihilar lungs bilaterally, new compared to the earlier chest CT. More peripheral lungs are clear. Small bilateral pleural effusions with adjacent atelectasis. Upper Abdomen: Limited images of the upper abdomen are unremarkable. Musculoskeletal: The infiltrative paraspinal mass described in the left lower thoracic spine (T9-T10 level) on the previous chest contrast-enhanced CT is difficult to visualize on this noncontrast exam. Again noted are mixed lytic and sclerotic changes throughout the entire skeleton compatible with previous description of diffuse myeloma. Again noted is an increase in the sclerotic change which may be secondary to interval treatment. There are, however, increase lytic changes also noted in the lower thoracic spine and upper lumbar spine suspicious for  progression of disease. IMPRESSION: 1. Scattered small nodular consolidations with interposed areas of ground-glass opacity within the central perihilar lungs bilaterally, new  compared to the earlier chest CT of 09/02/2015. This is highly concerning for atypical pneumonia including fungal, pneumocystis or viral (especially if currently immunocompromised). Less likely, this could represent perihilar edema related to volume overload. Neoplastic process cannot be confidently excluded but is also considered less likely given the bilaterality. 2. Diffuse osseous metastases related to patient's known multiple myeloma, overall similar to the previous exam of 09/02/2015. Some increased lytic changes within the lower thoracic spine and upper lumbar spine is suspicious for progression of disease. Areas of increased sclerotic change throughout the skeleton may be secondary to interval treatment. 3. Small bilateral pleural effusions with adjacent atelectasis. Electronically Signed   By: Franki Cabot M.D.   On: 12/02/2015 14:34   Nm Pulmonary Perf And Vent  Result Date: 11/21/2015 CLINICAL DATA:  Subacute onset of dyspnea and shortness of breath. Initial encounter. EXAM: NUCLEAR MEDICINE VENTILATION - PERFUSION LUNG SCAN TECHNIQUE: Ventilation images were obtained in multiple projections using inhaled aerosol Tc-21mDTPA. Perfusion images were obtained in multiple projections after intravenous injection of Tc-954mAA. RADIOPHARMACEUTICALS:  29.814 mCi Technetium-9930mPA aerosol inhalation and 2.565 mCi Technetium-22m27m IV COMPARISON:  Chest radiograph performed earlier today at 3:44 p.m. FINDINGS: Ventilation: Diffuse heterogeneity is noted with regard to ventilation within both lungs, with numerous small peripheral defects seen bilaterally. No large segmental ventilation defects are identified. Perfusion: There is minimal peripheral heterogeneity of perfusion, without a focal defect to suggest pulmonary embolus.  Perfusion of both lungs is much more normal in appearance than corresponding ventilation. IMPRESSION: Low probability for pulmonary embolus. Scattered small peripheral ventilation defects, without perfusion abnormality. Electronically Signed   By: JeffGarald Balding.   On: 11/21/2015 19:59   Dg Chest Port 1 View  Result Date: 12/01/2015 CLINICAL DATA:  Fever.  Dialysis patient. EXAM: PORTABLE CHEST 1 VIEW COMPARISON:  11/21/2015 FINDINGS: Improved aeration with decrease in pulmonary edema. Improved aeration in the lung bases with decreased atelectasis/infiltrate. Improvement in small bilateral pleural effusions Left jugular dialysis catheter in the right atrium unchanged. Port-A-Cath tip in the SVC unchanged. IMPRESSION: Interval improvement in pulmonary edema. Improvement in bibasilar airspace disease and small bilateral effusions. Electronically Signed   By: CharFranchot Gallo.   On: 12/01/2015 21:44     CBC  Recent Labs Lab 11/27/15 0517 12/01/15 2042 12/02/15 0416 12/02/15 1909 12/03/15 0550  WBC 7.6 5.5 3.6  --  4.0  HGB 8.3* 7.9* 6.5* 8.9* 8.7*  HCT 24.2* 22.9* 19.3* 26.3* 25.5*  PLT 121* 126* 98*  --  106*  MCV 90.4 88.2 89.6  --  87.7  MCH 31.0 30.3 30.4  --  29.9  MCHC 34.3 34.3 34.0  --  34.1  RDW 18.6* 18.2* 18.7*  --  17.7*  LYMPHSABS  --  0.6*  --   --   --   MONOABS  --  0.3  --   --   --   EOSABS  --  0.1  --   --   --   BASOSABS  --  0.0  --   --   --     Chemistries   Recent Labs Lab 11/27/15 1005 12/01/15 2042 12/02/15 0416 12/03/15 0550  NA 137 139 139 136  K 4.5 3.3* 3.3* 3.5  CL 104 98* 102 102  CO2 20* 27 27 20*  GLUCOSE 224* 176* 146* 160*  BUN 61* 33* 40* 52*  CREATININE 5.04* 3.01* 3.68* 4.61*  CALCIUM 9.0 8.7* 7.8* 8.2*  AST  --  142*  --   --   ALT  --  48  --   --   ALKPHOS  --  104  --   --   BILITOT  --  1.4*  --   --     ------------------------------------------------------------------------------------------------------------------ estimated creatinine clearance is 11.4 mL/min (by C-G formula based on SCr of 4.61 mg/dL (H)). ------------------------------------------------------------------------------------------------------------------ No results for input(s): HGBA1C in the last 72 hours. ------------------------------------------------------------------------------------------------------------------ No results for input(s): CHOL, HDL, LDLCALC, TRIG, CHOLHDL, LDLDIRECT in the last 72 hours. ------------------------------------------------------------------------------------------------------------------ No results for input(s): TSH, T4TOTAL, T3FREE, THYROIDAB in the last 72 hours.  Invalid input(s): FREET3 ------------------------------------------------------------------------------------------------------------------ No results for input(s): VITAMINB12, FOLATE, FERRITIN, TIBC, IRON, RETICCTPCT in the last 72 hours.  Coagulation profile No results for input(s): INR, PROTIME in the last 168 hours.  No results for input(s): DDIMER in the last 72 hours.  Cardiac Enzymes No results for input(s): CKMB, TROPONINI, MYOGLOBIN in the last 168 hours.  Invalid input(s): CK ------------------------------------------------------------------------------------------------------------------ Invalid input(s): [place]  Patient is a 50 year old with multiple myeloma presenting with sepsis and pneumonia #1 Sepsis ([place]) - Due to pneumonia CT scan shows atypical pneumonia, possible fungal I have discussed with Dr. Ola Spurr of infectious disease will see the patient continue antibiotics for now #2 multiple myeloma with worsening anemia status post transfusion Hemoglobin stable #3     End stage renal failure on dialysis [facility]) - nephrology consult for dialysis support  #4  HTN (hypertension) -  stable, continue home meds #5   Depression - continue home meds #6  multiple myeloma prognosis poor oncology input     Code Status Orders        Start     Ordered   12/02/15 0111  Full code  Continuous     12/02/15 0110    Code Status History    Date Active Date Inactive Code Status Order ID Comments User Context   11/14/2015 12:52 AM 11/17/2015  1:12 PM Full Code [id number]  Holley Raring, NP ED   11/14/2015 12:06 AM 11/14/2015 12:12 AM Full Code [id number]  Holley Raring, NP ED           Consults  ONCOLOGY   DVT Prophylaxis   heparin  Lab Results  Component Value Date   PLT 106 (L) 12/03/2015     Time Spent in minutes   32MIN  Greater than 50% of time spent in care coordination and counseling patient regarding the condition and plan of care.   Dustin Flock M.D on 12/03/2015 at 11:59 AM  Between 7am to 6pm - Pager - [phone]  After 6pm go to [email or web address] - password [email or web address] [facility]   Office  [phone]

## 2015-12-03 NOTE — Progress Notes (Signed)
Dialysis complete

## 2015-12-03 NOTE — Progress Notes (Signed)
[facility]  ROUNDING NOTE   Subjective:  Patient seen and evaluated during hemodialysis. She appears to be tolerating this well. She still has significant cough. Infectious disease consultation pending.  Objective:  Vital signs in last 24 hours:  Temp:  [98.6 F (37 C)-99.4 F (37.4 C)] 99.1 F (37.3 C) (09/27 0921) Pulse Rate:  [89-116] 112 (09/27 1130) Resp:  [16-27] 27 (09/27 1130) BP: (108-158)/(46-62) 140/55 (09/27 1130) SpO2:  [96 %-100 %] 96 % (09/27 1130) Weight:  [51.3 kg (113 lb 1.5 oz)] 51.3 kg (113 lb 1.5 oz) (09/27 0921)  Weight change:  Filed Weights   12/01/15 2027 12/02/15 0203 12/03/15 0921  Weight: 54.4 kg (120 lb) 49.4 kg (108 lb 14.4 oz) 51.3 kg (113 lb 1.5 oz)    Intake/Output: I/O last 3 completed shifts: In: 1336.2 [P.O.:300; I.V.:735.3; Blood:300.9] Out: 200 [Urine:200]   Intake/Output this shift:  Total I/O In: 240 [P.O.:240] Out: 0   Physical Exam: General: No acute distress  Head: Normocephalic, atraumatic. Moist oral mucosal membranes  Eyes: Anicteric  Neck: Supple, trachea midline  Lungs:  Bilateral rales, normal effort  Heart: S1S2 no rubs  Abdomen:  Soft, non tender, BS present  Extremities: Trace peripheral edema.  Neurologic: Nonfocal, moving all four extremities  Skin: No lesions  Access: [facility] permcath 9/13 Dr. Lucky Cowboy, rt IJ port    Basic Metabolic Panel:  Recent Labs Lab 11/27/15 1005 12/01/15 2042 12/02/15 0416 12/03/15 0550  NA 137 139 139 136  K 4.5 3.3* 3.3* 3.5  CL 104 98* 102 102  CO2 20* 27 27 20*  GLUCOSE 224* 176* 146* 160*  BUN 61* 33* 40* 52*  CREATININE 5.04* 3.01* 3.68* 4.61*  CALCIUM 9.0 8.7* 7.8* 8.2*  PHOS 4.5  --   --  4.9*    Liver Function Tests:  Recent Labs Lab 11/27/15 1005 12/01/15 2042  AST  --  142*  ALT  --  48  ALKPHOS  --  104  BILITOT  --  1.4*  PROT  --  7.6  ALBUMIN 2.9* 3.2*   No results for input(s): LIPASE, AMYLASE in the last 168 hours. No results for  input(s): AMMONIA in the last 168 hours.  CBC:  Recent Labs Lab 11/27/15 0517 12/01/15 2042 12/02/15 0416 12/02/15 1909 12/03/15 0550  WBC 7.6 5.5 3.6  --  4.0  NEUTROABS  --  4.5  --   --   --   HGB 8.3* 7.9* 6.5* 8.9* 8.7*  HCT 24.2* 22.9* 19.3* 26.3* 25.5*  MCV 90.4 88.2 89.6  --  87.7  PLT 121* 126* 98*  --  106*    Cardiac Enzymes: No results for input(s): CKTOTAL, CKMB, CKMBINDEX, TROPONINI in the last 168 hours.  BNP: Invalid input(s): POCBNP  CBG:  Recent Labs Lab 12/02/15 0727 12/02/15 1127 12/02/15 1627 12/02/15 2114 12/03/15 0729  GLUCAP 147* 180* 151* 151* 138*    Microbiology: Results for orders placed or performed during the hospital encounter of 12/01/15  Blood Culture (routine x 2)     Status: None (Preliminary result)   Collection Time: 12/01/15  8:42 PM  Result Value Ref Range Status   Specimen Description BLOOD LEFT HAND  Final   Special Requests BOTTLES DRAWN AEROBIC AND ANAEROBIC 5ML  Final   Culture NO GROWTH 2 DAYS  Final   Report Status PENDING  Incomplete  Blood Culture (routine x 2)     Status: None (Preliminary result)   Collection Time: 12/01/15  8:42  PM  Result Value Ref Range Status   Specimen Description BLOOD PORTA CATH  Final   Special Requests BOTTLES DRAWN AEROBIC AND ANAEROBIC 7ML  Final   Culture NO GROWTH 2 DAYS  Final   Report Status PENDING  Incomplete  Urine culture     Status: Abnormal   Collection Time: 12/01/15  8:42 PM  Result Value Ref Range Status   Specimen Description URINE, RANDOM  Final   Special Requests NONE  Final   Culture (A)  Final    <10,000 COLONIES/mL INSIGNIFICANT GROWTH Performed at [facility]    Report Status 12/03/2015 FINAL  Final    Coagulation Studies: No results for input(s): LABPROT, INR in the last 72 hours.  Urinalysis:  Recent Labs  12/01/15 2042  COLORURINE STRAW*  LABSPEC 1.008  PHURINE 9.0*  GLUCOSEU 150*  HGBUR 2+*  BILIRUBINUR NEGATIVE  KETONESUR  NEGATIVE  PROTEINUR 100*  NITRITE NEGATIVE  LEUKOCYTESUR NEGATIVE      Imaging: Ct Chest Wo Contrast  Result Date: 12/02/2015 CLINICAL DATA:  Hospitalized for 2 weeks for kidney failure, discharge 11/28/2015. History of multiple myeloma. Persistent dry cough for 3 weeks. Productive cough beginning yesterday. EXAM: CT CHEST WITHOUT CONTRAST TECHNIQUE: Multidetector CT imaging of the chest was performed following the standard protocol without IV contrast. COMPARISON:  Chest CT dated 09/02/2015. Chest x-ray dated 12/01/2015. FINDINGS: Cardiovascular: Heart size is upper normal, stable. No pericardial effusion. Thoracic aorta is normal in caliber. Mediastinum/Nodes: Scattered small lymph nodes appear stable. No masses or enlarged lymph nodes appreciated in the mediastinum or perihilar regions. Esophagus appears normal. Trachea and central bronchi are unremarkable. Lungs/Pleura: Small nodular consolidations and areas of interstitial thickening and/or ground-glass opacity within the central perihilar lungs bilaterally, new compared to the earlier chest CT. More peripheral lungs are clear. Small bilateral pleural effusions with adjacent atelectasis. Upper Abdomen: Limited images of the upper abdomen are unremarkable. Musculoskeletal: The infiltrative paraspinal mass described in the left lower thoracic spine (T9-T10 level) on the previous chest contrast-enhanced CT is difficult to visualize on this noncontrast exam. Again noted are mixed lytic and sclerotic changes throughout the entire skeleton compatible with previous description of diffuse myeloma. Again noted is an increase in the sclerotic change which may be secondary to interval treatment. There are, however, increase lytic changes also noted in the lower thoracic spine and upper lumbar spine suspicious for progression of disease. IMPRESSION: 1. Scattered small nodular consolidations with interposed areas of ground-glass opacity within the central  perihilar lungs bilaterally, new compared to the earlier chest CT of 09/02/2015. This is highly concerning for atypical pneumonia including fungal, pneumocystis or viral (especially if currently immunocompromised). Less likely, this could represent perihilar edema related to volume overload. Neoplastic process cannot be confidently excluded but is also considered less likely given the bilaterality. 2. Diffuse osseous metastases related to patient's known multiple myeloma, overall similar to the previous exam of 09/02/2015. Some increased lytic changes within the lower thoracic spine and upper lumbar spine is suspicious for progression of disease. Areas of increased sclerotic change throughout the skeleton may be secondary to interval treatment. 3. Small bilateral pleural effusions with adjacent atelectasis. Electronically Signed   By: Franki Cabot M.D.   On: 12/02/2015 14:34   Dg Chest Port 1 View  Result Date: 12/01/2015 CLINICAL DATA:  Fever.  Dialysis patient. EXAM: PORTABLE CHEST 1 VIEW COMPARISON:  11/21/2015 FINDINGS: Improved aeration with decrease in pulmonary edema. Improved aeration in the lung bases with decreased atelectasis/infiltrate. Improvement  in small bilateral pleural effusions Left jugular dialysis catheter in the right atrium unchanged. Port-A-Cath tip in the SVC unchanged. IMPRESSION: Interval improvement in pulmonary edema. Improvement in bibasilar airspace disease and small bilateral effusions. Electronically Signed   By: Franchot Gallo M.D.   On: 12/01/2015 21:44     Medications:     . ceFEPime (MAXIPIME) IV  1 g Intravenous Q24H  . ferrous sulfate  325 mg Oral Q breakfast  . furosemide  80 mg Oral Daily  . gabapentin  300 mg Oral TID  . heparin  5,000 Units Subcutaneous Q8H  . insulin aspart  0-5 Units Subcutaneous QHS  . insulin aspart  0-9 Units Subcutaneous TID WC  . vancomycin  500 mg Intravenous Q M,W,F-HD  . verapamil  40 mg Oral BID   acetaminophen **OR**  acetaminophen, benzonatate, guaiFENesin-codeine, ipratropium-albuterol, ipratropium-albuterol, ondansetron **OR** ondansetron (ZOFRAN) IV, oxyCODONE  Assessment/ Plan:  50 y.o.hispanic female with diabetes mellitus type 2, hypertension, multiple myeloma on chemotherapy, osteoarthritis, chronic kidney disease stage III   1. Acute renal failure on CKD stage III baseline Cr 1.4 from 8/28/217.  Concern for progression of myeloma kidney. Nonoliguric urine output. - 24 hour for creatinine clearance 7.   - Patient seen and evaluated during hemodialysis. Tolerating well. Complete dialysis today. We will plan for dialysis again on Friday.   2. 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 With thrombocytopenia.  - Oncology: Dr. Grayland Ormond -  Hemoglobin up to 8.7. Start the patient on Epogen 10,000 units IV with dialysis.  3. Multiple Myeloma:  Patient appears to have had progression of her underlying multiple myeloma.  Await further input from Dr. Grayland Ormond.  4.  Pneumonia, bacterial vs fungal.  J18.9.  Patient found to have atypical pattern of pneumonitis on chest CT.  She has underlying multiple myeloma and can be considered as being immunosuppressed.  Currently on cefepime, awaiting further ID input, will also consult pulmonology.  Consider adding azithromycin as well to cover atypicals.      LOS: 2 Flynt Breeze 9/27/201711:58 AM

## 2015-12-03 NOTE — Progress Notes (Signed)
Pre Dialysis 

## 2015-12-03 NOTE — Consult Note (Addendum)
[facility] Consultation      Assessment and Plan:  A: Pneumonia/lung nodules with bronchiectatic changes in a 50 yo female with multiple myeloma with recent use of immunosuppression (Velcade). Now with intractable cough, fevers of uncertain etiology.   P: Continue abx and serological workup per ID recs.  --Will plan for bronchoscopy as soon as able. Discussed with ID, will send for afb, fungal and routine cxs, viral cultures and cytology. --Symptomatic treatment of cough.    Date: 12/03/2015  MRN# [id number] Jamie Keller 10/24/1965  Referring Physician: Dr. Gwinda Passe Jamie Keller is a 50 y.o. old female seen in consultation for chief complaint of:    Chief Complaint  Patient presents with  . Fever    HPI:   [name] with a history of Multiple Myeloma, she was admitted to the hospital earlier this month with acute renal failure and has been on HD since that time. She has been on velcade.  History is obtained via a Patent attorney, she presented to the hospital with intractable cough and chest congestion as well as persistent fever. She was admitted ton 9/27 with possible pneumonia. She was started on IV abx and ID was consulted.  She emigrated from [place] about 18 yrs ago and has no known Tb contact with a recent negative PPD.   She had a CT chest without contrast performed. On my review of these images, there appear to be micronodular changes in the mid-zones bilaterally (RML and lingula), with accompanying mild bronchiectatic changes.     PMHX:   Past Medical History:  Diagnosis Date  . Diabetes mellitus without complication ([place])   . Hypertension   . Multiple myeloma ([place])   . Osteoarthritis   . Post-menopausal 2014   Surgical Hx:  Past Surgical History:  Procedure Laterality Date  . CESAREAN SECTION     x2  . PERIPHERAL VASCULAR CATHETERIZATION N/A 11/19/2015   Procedure: Dialysis/Perma Catheter Insertion;   Surgeon: Algernon Huxley, MD;  Location: [facility];  Service: Cardiovascular;  Laterality: N/A;   Family Hx:  Family History  Problem Relation Age of Onset  . Hypercholesterolemia Mother   . Hypertension Father    Social Hx:   Social History  Substance Use Topics  . Smoking status: Never Smoker  . Smokeless tobacco: Never Used  . Alcohol use No   Medication:       Allergies:  No known allergies  Review of Systems: Gen:  Denies  fever, sweats, chills HEENT: Denies blurred vision Cvc:  No dizziness, chest pain. Resp:   Denies sputum production, shortness of breath Gi: Denies swallowing difficulty, Gu:  Denies bladder incontinence, burning urine Ext:   No Joint pain, stiffness. Skin: No skin rash,  hives  Endoc:  No polyuria, polydipsia. Psych: No depression, insomnia. Other:  All other systems were reviewed with the patient and were negative other that what is mentioned in the HPI.   Physical Examination:   VS: BP (!) 148/53 (BP Location: Right Arm)   Pulse (!) 118   Temp 99.5 F (37.5 C) (Oral)   Resp 17   Ht 5' 2"  (1.575 m)   Wt 110 lb 8 oz (50.1 kg)   LMP 11/20/1985 (Approximate) Comment: Tubal ligation 1987  SpO2 97%   BMI 20.21 kg/m   General Appearance: No distress, appears comfortable on RA.  Neuro:without focal findings,  speech normal,  HEENT: PERRLA, EOM intact.   Pulmonary:  normal breath sounds, No wheezing.  CardiovascularNormal S1,S2.  No m/r/g.   Abdomen: Benign, Soft, non-tender. Renal:  No costovertebral tenderness  GU:  No performed at this time. Endoc: No evident thyromegaly, no signs of acromegaly. Skin:   warm, no rashes, no ecchymosis  Extremities: normal, no cyanosis, clubbing.  Other findings:    LABORATORY PANEL:   CBC  Recent Labs Lab 12/03/15 0550  WBC 4.0  HGB 8.7*  HCT 25.5*  PLT 106*    ------------------------------------------------------------------------------------------------------------------  Chemistries   Recent Labs Lab 12/01/15 2042  12/03/15 0550  NA 139  < > 136  K 3.3*  < > 3.5  CL 98*  < > 102  CO2 27  < > 20*  GLUCOSE 176*  < > 160*  BUN 33*  < > 52*  CREATININE 3.01*  < > 4.61*  CALCIUM 8.7*  < > 8.2*  AST 142*  --   --   ALT 48  --   --   ALKPHOS 104  --   --   BILITOT 1.4*  --   --   < > = values in this interval not displayed. ------------------------------------------------------------------------------------------------------------------  Cardiac Enzymes No results for input(s): TROPONINI in the last 168 hours. ------------------------------------------------------------  RADIOLOGY:  Ct Chest Wo Contrast  Result Date: 12/02/2015 CLINICAL DATA:  Hospitalized for 2 weeks for kidney failure, discharge 11/28/2015. History of multiple myeloma. Persistent dry cough for 3 weeks. Productive cough beginning yesterday. EXAM: CT CHEST WITHOUT CONTRAST TECHNIQUE: Multidetector CT imaging of the chest was performed following the standard protocol without IV contrast. COMPARISON:  Chest CT dated 09/02/2015. Chest x-ray dated 12/01/2015. FINDINGS: Cardiovascular: Heart size is upper normal, stable. No pericardial effusion. Thoracic aorta is normal in caliber. Mediastinum/Nodes: Scattered small lymph nodes appear stable. No masses or enlarged lymph nodes appreciated in the mediastinum or perihilar regions. Esophagus appears normal. Trachea and central bronchi are unremarkable. Lungs/Pleura: Small nodular consolidations and areas of interstitial thickening and/or ground-glass opacity within the central perihilar lungs bilaterally, new compared to the earlier chest CT. More peripheral lungs are clear. Small bilateral pleural effusions with adjacent atelectasis. Upper Abdomen: Limited images of the upper abdomen are unremarkable. Musculoskeletal: The  infiltrative paraspinal mass described in the left lower thoracic spine (T9-T10 level) on the previous chest contrast-enhanced CT is difficult to visualize on this noncontrast exam. Again noted are mixed lytic and sclerotic changes throughout the entire skeleton compatible with previous description of diffuse myeloma. Again noted is an increase in the sclerotic change which may be secondary to interval treatment. There are, however, increase lytic changes also noted in the lower thoracic spine and upper lumbar spine suspicious for progression of disease. IMPRESSION: 1. Scattered small nodular consolidations with interposed areas of ground-glass opacity within the central perihilar lungs bilaterally, new compared to the earlier chest CT of 09/02/2015. This is highly concerning for atypical pneumonia including fungal, pneumocystis or viral (especially if currently immunocompromised). Less likely, this could represent perihilar edema related to volume overload. Neoplastic process cannot be confidently excluded but is also considered less likely given the bilaterality. 2. Diffuse osseous metastases related to patient's known multiple myeloma, overall similar to the previous exam of 09/02/2015. Some increased lytic changes within the lower thoracic spine and upper lumbar spine is suspicious for progression of disease. Areas of increased sclerotic change throughout the skeleton may be secondary to interval treatment. 3. Small bilateral pleural effusions with adjacent atelectasis. Electronically Signed   By: Roxy Horseman.D.  On: 12/02/2015 14:34   Dg Chest Port 1 View  Result Date: 12/01/2015 CLINICAL DATA:  Fever.  Dialysis patient. EXAM: PORTABLE CHEST 1 VIEW COMPARISON:  11/21/2015 FINDINGS: Improved aeration with decrease in pulmonary edema. Improved aeration in the lung bases with decreased atelectasis/infiltrate. Improvement in small bilateral pleural effusions Left jugular dialysis catheter in the right  atrium unchanged. Port-A-Cath tip in the SVC unchanged. IMPRESSION: Interval improvement in pulmonary edema. Improvement in bibasilar airspace disease and small bilateral effusions. Electronically Signed   By: Franchot Gallo M.D.   On: 12/01/2015 21:44       Thank  you for the consultation and for allowing [facility] to assist in the care of your patient. Our recommendations are noted above.  Please contact us if we can be of further service.   Marda Stalker, MD.  Board Certified in Internal Medicine, Pulmonary Medicine, [place], and Sleep Medicine.  [facility] Office Number: [phone]  Patricia Pesa, M.D.  Vilinda Boehringer, M.D.  Merton Border, M.D  12/03/2015

## 2015-12-03 NOTE — Progress Notes (Signed)
Post Dialysis Assessment.   

## 2015-12-03 NOTE — Progress Notes (Signed)
Dialysis started 

## 2015-12-03 NOTE — Consult Note (Signed)
[place]  [phone] [phone]  ID: Jamie Keller OB: Jul 12, 1965  MR#: [id number]  VQQ#:[id number]  [name], MD as PCP - General (Family Medicine)  CHIEF COMPLAINT: Patient admitted with increasing cough and shortness of breath with suspicion of atypical pneumonia she also noted to have likely progressive myeloma leading to dialysis.  INTERVAL HISTORY: Patient is a 50 year old female who is currently undergoing chemotherapy for recurrent multiple myeloma. She recently presented with acute renal failure and now is on permanent dialysis. She was readmitted to the hospital with worsening cough and shortness of breath is suspicion of an underlying atypical pneumonia. Patient was evaluated in dialysis in the presence of an interpreter. Her only complaint is of persistent cough. She has no neurologic complaints. She denies any fevers. She has a poor appetite, but denies weight loss. She denies any chest pain or hemoptysis. She has no nausea, vomiting, constipation, or diarrhea. Patient feels generally terrible, but offers no further specific complaints.  REVIEW OF SYSTEMS:   Review of Systems  Constitutional: Positive for malaise/fatigue. Negative for fever and weight loss.  Respiratory: Positive for cough and shortness of breath. Negative for hemoptysis.   Cardiovascular: Negative.  Negative for chest pain and leg swelling.  Gastrointestinal: Negative.  Negative for abdominal pain.  Musculoskeletal: Negative.   Neurological: Positive for weakness.  Psychiatric/Behavioral: Negative.     As per HPI. Otherwise, a complete review of systems is negative.  PAST MEDICAL HISTORY: Past Medical History:  Diagnosis Date  . Diabetes mellitus without complication ([place])   . Hypertension   . Multiple myeloma ([place])   . Osteoarthritis   . Post-menopausal 2014    PAST SURGICAL HISTORY: Past Surgical History:    Procedure Laterality Date  . CESAREAN SECTION     x2  . PERIPHERAL VASCULAR CATHETERIZATION N/A 11/19/2015   Procedure: Dialysis/Perma Catheter Insertion;  Surgeon: Algernon Huxley, MD;  Location: [facility];  Service: Cardiovascular;  Laterality: N/A;    FAMILY HISTORY: Family History  Problem Relation Age of Onset  . Hypercholesterolemia Mother   . Hypertension Father     ADVANCED DIRECTIVES (Y/N):  _0 @  HEALTH MAINTENANCE: Social History  Substance Use Topics  . Smoking status: Never Smoker  . Smokeless tobacco: Never Used  . Alcohol use No     Colonoscopy:  PAP:  Bone density:  Lipid panel:  Allergies  Allergen Reactions  . No Known Allergies     Current Facility-Administered Medications  Medication Dose Route Frequency Provider Last Rate Last Dose  . acetaminophen (TYLENOL) tablet 650 mg  650 mg Oral Q6H PRN Lance Coon, MD   650 mg at 12/02/15 0203   Or  . acetaminophen (TYLENOL) suppository 650 mg  650 mg Rectal Q6H PRN Lance Coon, MD      . benzonatate (TESSALON) capsule 200 mg  200 mg Oral TID PRN Dustin Flock, MD   200 mg at 12/03/15 1237  . ceFEPIme (MAXIPIME) 1 g in dextrose 5 % 50 mL IVPB  1 g Intravenous Q24H Ramond Dial, RPH   1 g at 12/02/15 2303  . [START ON 12/05/2015] epoetin alfa (EPOGEN,PROCRIT) injection 10,000 Units  10,000 Units Intravenous Q M,W,F-HD Munsoor Lateef, MD      . ferrous sulfate tablet 325 mg  325 mg Oral Q breakfast Dustin Flock, MD   325 mg at 12/03/15 0825  . furosemide (LASIX) tablet 80 mg  80 mg Oral Daily Shreyang Patel,  MD   80 mg at 12/03/15 1325  . gabapentin (NEURONTIN) capsule 300 mg  300 mg Oral TID Dustin Flock, MD   300 mg at 12/03/15 0911  . guaiFENesin-codeine 100-10 MG/5ML solution 10 mL  10 mL Oral Q4H PRN Dustin Flock, MD   10 mL at 12/03/15 1326  . heparin injection 5,000 Units  5,000 Units Subcutaneous Q8H Lance Coon, MD   5,000 Units at 12/03/15 0546  . insulin aspart (novoLOG)  injection 0-5 Units  0-5 Units Subcutaneous QHS Lance Coon, MD      . insulin aspart (novoLOG) injection 0-9 Units  0-9 Units Subcutaneous TID WC Lance Coon, MD   1 Units at 12/03/15 0825  . ipratropium-albuterol (DUONEB) 0.5-2.5 (3) MG/3ML nebulizer solution 3 mL  3 mL Nebulization Q4H PRN Lance Coon, MD      . ipratropium-albuterol (DUONEB) 0.5-2.5 (3) MG/3ML nebulizer solution 3 mL  3 mL Nebulization Q6H PRN Dustin Flock, MD      . ondansetron (ZOFRAN) tablet 4 mg  4 mg Oral Q6H PRN Lance Coon, MD       Or  . ondansetron [facility]) injection 4 mg  4 mg Intravenous Q6H PRN Lance Coon, MD   4 mg at 12/03/15 0836  . oxyCODONE (Oxy IR/ROXICODONE) immediate release tablet 10 mg  10 mg Oral Q6H PRN Dustin Flock, MD      . vancomycin (VANCOCIN) 500 mg in sodium chloride 0.9 % 100 mL IVPB  500 mg Intravenous Q M,W,F-HD Dustin Flock, MD   500 mg at 12/03/15 1159  . verapamil (CALAN) tablet 40 mg  40 mg Oral BID Lance Coon, MD   40 mg at 12/03/15 1325    OBJECTIVE: Vitals:   12/03/15 1301 12/03/15 1316  BP: (!) 147/63 (!) 148/53  Pulse: (!) 119 (!) 118  Resp: (!) 24 17  Temp: 99 F (37.2 C) 99.5 F (37.5 C)     Body mass index is 20.21 kg/m.    ECOG FS:3 - Symptomatic, >50% confined to bed  General: Well-developed, well-nourished, no acute distress. Eyes: Pink conjunctiva, anicteric sclera. HEENT: Normocephalic, moist mucous membranes, clear oropharnyx. Lungs: Clear to auscultation bilaterally. Heart: Regular rate and rhythm. No rubs, murmurs, or gallops. Abdomen: Soft, nontender, nondistended. No organomegaly noted, normoactive bowel sounds. Musculoskeletal: No edema, cyanosis, or clubbing. Neuro: Alert, answering all questions appropriately. Cranial nerves grossly intact. Skin: No rashes or petechiae noted. Psych: Normal affect. Lymphatics: No cervical, calvicular, axillary or inguinal LAD.   LAB RESULTS:  Lab Results  Component Value Date   NA 136 12/03/2015   K  3.5 12/03/2015   CL 102 12/03/2015   CO2 20 (L) 12/03/2015   GLUCOSE 160 (H) 12/03/2015   BUN 52 (H) 12/03/2015   CREATININE 4.61 (H) 12/03/2015   CALCIUM 8.2 (L) 12/03/2015   PROT 7.6 12/01/2015   ALBUMIN 3.2 (L) 12/01/2015   AST 142 (H) 12/01/2015   ALT 48 12/01/2015   ALKPHOS 104 12/01/2015   BILITOT 1.4 (H) 12/01/2015   GFRNONAA 10 (L) 12/03/2015   GFRAA 12 (L) 12/03/2015    Lab Results  Component Value Date   WBC 4.0 12/03/2015   NEUTROABS 4.5 12/01/2015   HGB 8.7 (L) 12/03/2015   HCT 25.5 (L) 12/03/2015   MCV 87.7 12/03/2015   PLT 106 (L) 12/03/2015     STUDIES: Ct Abdomen Pelvis Wo Contrast  Result Date: 11/14/2015 CLINICAL DATA:  Acute onset of generalized abdominal distention. Generalized weakness and near syncope. Shortness of breath.  Current history of multiple myeloma. Chills and vomiting. Initial encounter. EXAM: CT ABDOMEN AND PELVIS WITHOUT CONTRAST TECHNIQUE: Multidetector CT imaging of the abdomen and pelvis was performed following the standard protocol without IV contrast. COMPARISON:  CT of the abdomen and pelvis performed 09/02/2015 FINDINGS: Lower chest: Small bilateral pleural effusions are noted, right greater than left, with bibasilar airspace opacities, possibly reflecting atelectasis or pneumonia. Hepatobiliary: The liver is unremarkable in appearance. Trace ascites is seen tracking about the liver and spleen. The gallbladder is grossly unremarkable in appearance. The common bile duct remains normal in caliber. Pancreas: Vague soft tissue inflammation is suggested about the pancreas, with fluid tracking inferiorly along the duodenum and Gerota's fascia on the right, concerning for acute pancreatitis. Evaluation for devascularization is limited without contrast. No pseudocysts are seen. Spleen: The spleen is unremarkable in appearance. Adrenals/Urinary Tract: The adrenal glands are grossly unremarkable. Nonspecific perinephric stranding is noted bilaterally.  There is no evidence of hydronephrosis. No renal or ureteral stones are identified. Stomach/Bowel: The stomach is grossly unremarkable in appearance. No significant small bowel abnormalities are seen. The appendix is normal in caliber, without evidence of appendicitis. The colon is decompressed and grossly unremarkable in appearance. Vascular/Lymphatic: The vasculature is not well assessed without contrast. Minimal vascular calcification is seen. No retroperitoneal lymphadenopathy is seen. No pelvic sidewall lymphadenopathy is identified. Reproductive: The bladder is mildly distended and grossly unremarkable. The uterus is grossly unremarkable in appearance. The ovaries are relatively symmetric. No suspicious adnexal masses are seen. Other: A small amount of fluid within the pelvis. Presacral stranding is noted. Musculoskeletal: There is diffuse sclerosis and numerous lytic lesions throughout the visualized osseous structures, with chronic loss of height noted at T11. The visualized musculature is grossly unremarkable in appearance. IMPRESSION: 1. Vague soft tissue inflammation about the pancreas, with fluid tracking inferiorly along the duodenum and Gerota's fascia on the right, concerning for acute pancreatitis. Would correlate with pancreatic lab values. No pseudocyst seen. Evaluation for devascularization is limited without contrast. 2. Trace ascites noted within the abdomen and pelvis. 3. Small bilateral pleural effusions, right greater than left, with bibasilar airspace opacities possibly reflecting atelectasis or pneumonia. 4. Diffuse sclerosis and numerous lytic lesions throughout the visualized osseous structures, with chronic loss of height at T11. This reflects the patient's known multiple myeloma. Electronically Signed   By: Garald Balding M.D.   On: 11/14/2015 02:32   Dg Chest 1 View  Result Date: 11/13/2015 CLINICAL DATA:  Pallor. Abdominal pain and shortness of breath. Multiple myeloma. EXAM: CHEST  1 VIEW COMPARISON:  09/22/2015 FINDINGS: Power injectable right IJ Port-A-Cath tip: SVC. Mild enlargement of the cardiopericardial silhouette with bibasilar airspace opacities obscuring the hemidiaphragms. Old bilateral lateral lower rib fractures. Thoracic spondylosis. IMPRESSION: 1. Obscuration of both hemidiaphragms is increased from 09/22/2015 and may reflect atelectasis or pneumonia. The underlying pleural effusions are not readily excluded either. 2. Mild enlargement of the cardiopericardial silhouette. Electronically Signed   By: Van Clines M.D.   On: 11/13/2015 23:07   Dg Chest 2 View  Result Date: 11/21/2015 CLINICAL DATA:  50 year old with end-stage renal disease on hemodialysis presenting with generalized weakness, cough and shortness of breath. Follow-up bilateral pleural effusions. EXAM: CHEST  2 VIEW COMPARISON:  11/20/2015, 11/13/2015 and earlier, including CT chest 09/02/2015 and earlier. FINDINGS: Cardiac silhouette markedly enlarged. Pulmonary venous hypertension and mild diffuse interstitial pulmonary edema, increased since yesterday. Bilateral pleural effusions, left greater than right, with associated consolidation the lower lobes, slightly increased since yesterday. Left  jugular dialysis catheter tips in the right atrium, unchanged. Right jugular Port-A-Cath tip at or near the cavoatrial junction, unchanged. Visualized bony thorax intact. Old compression fracture of the upper endplate of D32, unchanged. IMPRESSION: Worsening CHF and/or fluid overload, with increased interstitial pulmonary edema and enlarging bilateral pleural effusions since yesterday. Worsening associated passive atelectasis and/or pneumonia involving the lower lobes, left greater than right. Electronically Signed   By: Evangeline Dakin M.D.   On: 11/21/2015 15:59   Dg Chest 2 View  Result Date: 11/20/2015 CLINICAL DATA:  Abdominal pain.  TB. EXAM: CHEST  2 VIEW COMPARISON:  11/13/2015 FINDINGS:  Cardiopericardial enlargement that is stable. New dialysis catheter from the left with tips at the right atrium. Small pleural effusions and bibasilar atelectasis, increased. No septal thickening to suggest edema. History of multiple myeloma with spinal sclerosis and chronic T11 compression fracture IMPRESSION: Increased pleural effusions and bibasilar atelectasis. Cannot exclude superimposed pneumonia. Electronically Signed   By: Monte Fantasia M.D.   On: 11/20/2015 14:01   Ct Chest Wo Contrast  Result Date: 12/02/2015 CLINICAL DATA:  Hospitalized for 2 weeks for kidney failure, discharge 11/28/2015. History of multiple myeloma. Persistent dry cough for 3 weeks. Productive cough beginning yesterday. EXAM: CT CHEST WITHOUT CONTRAST TECHNIQUE: Multidetector CT imaging of the chest was performed following the standard protocol without IV contrast. COMPARISON:  Chest CT dated 09/02/2015. Chest x-ray dated 12/01/2015. FINDINGS: Cardiovascular: Heart size is upper normal, stable. No pericardial effusion. Thoracic aorta is normal in caliber. Mediastinum/Nodes: Scattered small lymph nodes appear stable. No masses or enlarged lymph nodes appreciated in the mediastinum or perihilar regions. Esophagus appears normal. Trachea and central bronchi are unremarkable. Lungs/Pleura: Small nodular consolidations and areas of interstitial thickening and/or ground-glass opacity within the central perihilar lungs bilaterally, new compared to the earlier chest CT. More peripheral lungs are clear. Small bilateral pleural effusions with adjacent atelectasis. Upper Abdomen: Limited images of the upper abdomen are unremarkable. Musculoskeletal: The infiltrative paraspinal mass described in the left lower thoracic spine (T9-T10 level) on the previous chest contrast-enhanced CT is difficult to visualize on this noncontrast exam. Again noted are mixed lytic and sclerotic changes throughout the entire skeleton compatible with previous  description of diffuse myeloma. Again noted is an increase in the sclerotic change which may be secondary to interval treatment. There are, however, increase lytic changes also noted in the lower thoracic spine and upper lumbar spine suspicious for progression of disease. IMPRESSION: 1. Scattered small nodular consolidations with interposed areas of ground-glass opacity within the central perihilar lungs bilaterally, new compared to the earlier chest CT of 09/02/2015. This is highly concerning for atypical pneumonia including fungal, pneumocystis or viral (especially if currently immunocompromised). Less likely, this could represent perihilar edema related to volume overload. Neoplastic process cannot be confidently excluded but is also considered less likely given the bilaterality. 2. Diffuse osseous metastases related to patient's known multiple myeloma, overall similar to the previous exam of 09/02/2015. Some increased lytic changes within the lower thoracic spine and upper lumbar spine is suspicious for progression of disease. Areas of increased sclerotic change throughout the skeleton may be secondary to interval treatment. 3. Small bilateral pleural effusions with adjacent atelectasis. Electronically Signed   By: Franki Cabot M.D.   On: 12/02/2015 14:34   Nm Pulmonary Perf And Vent  Result Date: 11/21/2015 CLINICAL DATA:  Subacute onset of dyspnea and shortness of breath. Initial encounter. EXAM: NUCLEAR MEDICINE VENTILATION - PERFUSION LUNG SCAN TECHNIQUE: Ventilation images were obtained in  multiple projections using inhaled aerosol Tc-80mDTPA. Perfusion images were obtained in multiple projections after intravenous injection of Tc-980mAA. RADIOPHARMACEUTICALS:  29.814 mCi Technetium-997mPA aerosol inhalation and 2.565 mCi Technetium-60m21m IV COMPARISON:  Chest radiograph performed earlier today at 3:44 p.m. FINDINGS: Ventilation: Diffuse heterogeneity is noted with regard to ventilation within  both lungs, with numerous small peripheral defects seen bilaterally. No large segmental ventilation defects are identified. Perfusion: There is minimal peripheral heterogeneity of perfusion, without a focal defect to suggest pulmonary embolus. Perfusion of both lungs is much more normal in appearance than corresponding ventilation. IMPRESSION: Low probability for pulmonary embolus. Scattered small peripheral ventilation defects, without perfusion abnormality. Electronically Signed   By: JeffGarald Balding.   On: 11/21/2015 19:59   Dg Chest Port 1 View  Result Date: 12/01/2015 CLINICAL DATA:  Fever.  Dialysis patient. EXAM: PORTABLE CHEST 1 VIEW COMPARISON:  11/21/2015 FINDINGS: Improved aeration with decrease in pulmonary edema. Improved aeration in the lung bases with decreased atelectasis/infiltrate. Improvement in small bilateral pleural effusions Left jugular dialysis catheter in the right atrium unchanged. Port-A-Cath tip in the SVC unchanged. IMPRESSION: Interval improvement in pulmonary edema. Improvement in bibasilar airspace disease and small bilateral effusions. Electronically Signed   By: CharFranchot Gallo.   On: 12/01/2015 21:44    ASSESSMENT: Increasing cough and shortness of breath with suspicion of atypical pneumonia she also noted to have likely progressive myeloma leading to dialysis.  PLAN:    1. Cough/sepsis: CT scan suggestive of an atypical pneumonia. Infectious disease consult is pending. Continue current antibiotics. 2. Multiple myeloma: Patient likely has progressive disease now that she has progressed to dialysis. She most recently was on single agent Velcade. Once patient is discharged from the hospital she can follow-up in the [place] further evaluation and discussion of reinitiating treatments. 3. End-stage renal disease: Patient now on dialysis. Case discussed with nephrology. Appreciate their input. 4. Anemia: Likely multifactorial given progressive myeloma as  well as end-stage renal disease. Patient is receiving Procrit with dialysis. No need for blood transfusion at this time.  Appreciate consult, will follow.  TimoLloyd Huger   12/03/2015 1:36 PM

## 2015-12-03 NOTE — Progress Notes (Signed)
ANTIBIOTIC CONSULT NOTE - INITIAL  Pharmacy Consult for Levaquin  Indication: pneumonia  Allergies  Allergen Reactions  . No Known Allergies     Patient Measurements: Height: 5' 2"  (157.5 cm) Weight: 110 lb 8 oz (50.1 kg) IBW/kg (Calculated) : 50.1 Adjusted Body Weight:   Vital Signs: Temp: 99.5 F (37.5 C) (09/27 1316) Temp Source: Oral (09/27 1316) BP: 148/53 (09/27 1316) Pulse Rate: 118 (09/27 1316) Intake/Output from previous day: 09/26 0701 - 09/27 0700 In: 1040.2 [P.O.:240; I.V.:499.3; Blood:300.9] Out: 200 [Urine:200] Intake/Output from this shift: Total I/O In: 240 [P.O.:240] Out: 1500 [Other:1500]  Labs:  Recent Labs  12/01/15 2042 12/02/15 0416 12/02/15 1909 12/03/15 0550  WBC 5.5 3.6  --  4.0  HGB 7.9* 6.5* 8.9* 8.7*  PLT 126* 98*  --  106*  CREATININE 3.01* 3.68*  --  4.61*   Estimated Creatinine Clearance: 11.4 mL/min (by C-G formula based on SCr of 4.61 mg/dL (H)). No results for input(s): VANCOTROUGH, VANCOPEAK, VANCORANDOM, GENTTROUGH, GENTPEAK, GENTRANDOM, TOBRATROUGH, TOBRAPEAK, TOBRARND, AMIKACINPEAK, AMIKACINTROU, AMIKACIN in the last 72 hours.   Microbiology: Recent Results (from the past 720 hour(s))  MRSA PCR Screening     Status: None   Collection Time: 11/14/15  3:11 AM  Result Value Ref Range Status   MRSA by PCR NEGATIVE NEGATIVE Final    Comment:        The GeneXpert MRSA Assay (FDA approved for NASAL specimens only), is one component of a comprehensive MRSA colonization surveillance program. It is not intended to diagnose MRSA infection nor to guide or monitor treatment for MRSA infections.   Blood Culture (routine x 2)     Status: None (Preliminary result)   Collection Time: 12/01/15  8:42 PM  Result Value Ref Range Status   Specimen Description BLOOD LEFT HAND  Final   Special Requests BOTTLES DRAWN AEROBIC AND ANAEROBIC 5ML  Final   Culture NO GROWTH 2 DAYS  Final   Report Status PENDING  Incomplete  Blood Culture  (routine x 2)     Status: None (Preliminary result)   Collection Time: 12/01/15  8:42 PM  Result Value Ref Range Status   Specimen Description BLOOD PORTA CATH  Final   Special Requests BOTTLES DRAWN AEROBIC AND ANAEROBIC 7ML  Final   Culture NO GROWTH 2 DAYS  Final   Report Status PENDING  Incomplete  Urine culture     Status: Abnormal   Collection Time: 12/01/15  8:42 PM  Result Value Ref Range Status   Specimen Description URINE, RANDOM  Final   Special Requests NONE  Final   Culture (A)  Final    <10,000 COLONIES/mL INSIGNIFICANT GROWTH Performed at [facility]    Report Status 12/03/2015 FINAL  Final    Medical History: Past Medical History:  Diagnosis Date  . Diabetes mellitus without complication ([place])   . Hypertension   . Multiple myeloma ([place])   . Osteoarthritis   . Post-menopausal 2014    Medications:  Prescriptions Prior to Admission  Medication Sig Dispense Refill Last Dose  . ferrous sulfate 325 (65 FE) MG tablet Take 1 tablet (325 mg total) by mouth daily with breakfast. 30 tablet 0 12/02/2015 at Unknown time  . furosemide (LASIX) 80 MG tablet Take 1 tablet (80 mg total) by mouth daily. 30 tablet 0 12/01/2015 at Unknown time  . gabapentin (NEURONTIN) 300 MG capsule Take 1 capsule (300 mg total) by mouth 3 (three) times daily. 90 capsule 2 12/01/2015 at Unknown time  .  Oxycodone HCl 10 MG TABS Take 1 tablet (10 mg total) by mouth every 6 (six) hours as needed. (Patient taking differently: Take 10 mg by mouth every 6 (six) hours as needed (pain). ) 60 tablet 0 prn at prn  . verapamil (CALAN) 40 MG tablet Take 1 tablet (40 mg total) by mouth 2 (two) times daily. 60 tablet 0 12/01/2015 at Unknown time  . [DISCONTINUED] megestrol (MEGACE) 40 MG tablet Take 1 tablet (40 mg total) by mouth daily. (Patient not taking: Reported on 12/02/2015) 30 tablet 2 Not Taking at Unknown time   Assessment: Pharmacy consulted to dose levaquin in this 50 year old female with HCAP,  on HD.   Goal of Therapy:  resolution of infection  Plan:  Expected duration 10 days with resolution of temperature and/or normalization of WBC   Levaquin 750 mg IV X 1 to be given on 9/27 followed by levaquin 500 mg IV Q48H to start on 9/29 @ 18:00.   Altan Kraai D 12/03/2015,5:25 PM

## 2015-12-04 ENCOUNTER — Encounter
Admission: EM | Disposition: A | Discharge status: Home / Self Care | Payer: Self-pay | Source: Home / Self Care | Attending: Internal Medicine

## 2015-12-04 ENCOUNTER — Inpatient Hospital Stay: Payer: Self-pay

## 2015-12-04 LAB — GLUCOSE, CAPILLARY
GLUCOSE-CAPILLARY: 161 mg/dL — AB (ref 65–99)
GLUCOSE-CAPILLARY: 239 mg/dL — AB (ref 65–99)
Glucose-Capillary: 155 mg/dL — ABNORMAL HIGH (ref 65–99)
Glucose-Capillary: 199 mg/dL — ABNORMAL HIGH (ref 65–99)

## 2015-12-04 LAB — MYCOPLASMA PNEUMONIAE ANTIBODY, IGM

## 2015-12-04 LAB — RAPID HIV SCREEN (HIV 1/2 AB+AG)
HIV 1/2 ANTIBODIES: NONREACTIVE
HIV-1 P24 ANTIGEN - HIV24: NONREACTIVE

## 2015-12-04 LAB — HISTOPLASMA ANTIGEN, URINE: Histoplasma Antigen, urine: <0.5 (ref <0.5)

## 2015-12-04 SURGERY — BRONCHOSCOPY, WITH FLUOROSCOPY
Anesthesia: Moderate Sedation | Laterality: Right

## 2015-12-04 MED ORDER — MIDAZOLAM HCL 5 MG/5ML IJ SOLN
INTRAMUSCULAR | Status: AC
  Filled 2015-12-04: qty 5

## 2015-12-04 MED ORDER — FENTANYL CITRATE (PF) 100 MCG/2ML IJ SOLN
INTRAMUSCULAR | Status: DC | PRN
  Administered 2015-12-04: 50 ug via INTRAVENOUS

## 2015-12-04 MED ORDER — PHENYLEPHRINE HCL 0.25 % NA SOLN
1.0000 | Freq: Four times a day (QID) | NASAL | Status: DC | PRN
  Filled 2015-12-04: qty 15

## 2015-12-04 MED ORDER — FENTANYL CITRATE (PF) 100 MCG/2ML IJ SOLN
INTRAMUSCULAR | Status: AC
  Filled 2015-12-04: qty 4

## 2015-12-04 MED ORDER — MIDAZOLAM HCL 2 MG/2ML IJ SOLN
INTRAMUSCULAR | Status: DC | PRN
  Administered 2015-12-04 (×4): 2 mg via INTRAVENOUS

## 2015-12-04 MED ORDER — HEPARIN SOD (PORK) LOCK FLUSH 10 UNIT/ML IV SOLN
INTRAVENOUS | Status: AC
  Filled 2015-12-04: qty 1

## 2015-12-04 MED ORDER — LIDOCAINE HCL 2 % EX GEL
1.0000 "application " | Freq: Once | CUTANEOUS | Status: DC
  Filled 2015-12-04: qty 5

## 2015-12-04 MED ORDER — BUTAMBEN-TETRACAINE-BENZOCAINE 2-2-14 % EX AERO
1.0000 | INHALATION_SPRAY | Freq: Once | CUTANEOUS | Status: DC
  Filled 2015-12-04: qty 20

## 2015-12-04 NOTE — Progress Notes (Signed)
Jamie Keller, is a 50 y.o. female, DOB - 10/14/1965, VCB:[id number]  Admit date - 12/01/2015   Admitting Physician Lance Coon, MD  Outpatient Primary MD for the patient is [name], MD   LOS - 3  Subjective: Patient still having cough or shortness of breath is improved    Review of Systems:   CONSTITUTIONAL: No fever. No fatigue, weakness. No weight gain, no weight loss.  EYES: No blurry or double vision.  ENT: No tinnitus. No postnasal drip. No redness of the oropharynx.  RESPIRATORY: Positive cough, no wheeze, no hemoptysis. Positive dyspnea.  CARDIOVASCULAR: No chest pain. No orthopnea. No palpitations. No syncope.  GASTROINTESTINAL: No nausea, no vomiting or diarrhea. + abdominal pain. No melena or hematochezia.  GENITOURINARY: No dysuria or hematuria.  ENDOCRINE: No polyuria or nocturia. No heat or cold intolerance.  HEMATOLOGY: No anemia. No bruising. No bleeding.  INTEGUMENTARY: No rashes. No lesions.  MUSCULOSKELETAL: No arthritis. No swelling. No gout.  NEUROLOGIC: No numbness, tingling, or ataxia. No seizure-type activity.  PSYCHIATRIC: No anxiety. No insomnia. No ADD.    Vitals:   Vitals:   12/04/15 1105 12/04/15 1110 12/04/15 1139 12/04/15 1432  BP:   (!) 107/42 (!) 127/57  Pulse: 99 100 (!) 105 (!) 112  Resp: (!) 27 (!) 23 20   Temp:   98.1 F (36.7 C) 98.6 F (37 C)  TempSrc:   Oral Oral  SpO2: 93% 95% 96% 97%  Weight:      Height:        Wt Readings from Last 3 Encounters:  12/03/15 110 lb 8 oz (50.1 kg)  11/28/15 112 lb 4.8 oz (50.9 kg)  10/27/15 115 lb 4.8 oz (52.3 kg)     Intake/Output Summary (Last 24 hours) at 12/04/15 1439 Last data filed at 12/04/15 1206   Gross per 24 hour  Intake              174 ml  Output              575 ml  Net             -401 ml    Physical Exam:   GENERAL: Pleasant-appearing in no apparent distress.  HEAD, EYES, EARS, NOSE AND THROAT: Atraumatic, normocephalic. Extraocular muscles are intact. Pupils equal and reactive to light.  Sclerae anicteric. No conjunctival injection. No oro-pharyngeal erythema.  NECK: Supple. There is no jugular venous distention. No bruits, no lymphadenopathy, no thyromegaly.  HEART: Regular rate and rhythm,. No murmurs, no rubs, no clicks.  LUNGS: Rhonchus breath sounds bilaterally especially in the upper lobes  ABDOMEN: Soft, flat, nontender, nondistended. Has good bowel sounds. No hepatosplenomegaly appreciated.  EXTREMITIES: No evidence of any cyanosis, clubbing, or peripheral edema.  +2 pedal and radial pulses bilaterally.  NEUROLOGIC: The patient is alert, awake, and oriented x3 with no focal motor or sensory deficits appreciated bilaterally.  SKIN: Moist and warm with no rashes appreciated.  Psych: Not anxious, depressed LN: No inguinal LN enlargement    Antibiotics   Anti-infectives    Start     Dose/Rate Route Frequency Ordered Stop   12/05/15 1730  levofloxacin (LEVAQUIN) IVPB 500 mg     500 mg 100 mL/hr over 60 Minutes Intravenous Every 48 hours 12/03/15 1729     12/03/15 1900  levofloxacin (LEVAQUIN) IVPB 750 mg  Status:  Discontinued     750 mg 100 mL/hr over 90 Minutes Intravenous Every 24 hours 12/03/15 1725 12/03/15 1840   12/03/15 1730  levofloxacin (LEVAQUIN) IVPB 750 mg     750 mg 100 mL/hr over 90 Minutes Intravenous  Once 12/03/15 1729 12/03/15 2021   12/03/15 1200  vancomycin (VANCOCIN) 500 mg in sodium chloride 0.9 % 100 mL IVPB     500 mg 100 mL/hr over 60 Minutes Intravenous Every M-W-F (Hemodialysis) 12/02/15 1356     12/03/15 0900  vancomycin (VANCOCIN) IVPB 1000 mg/200 mL premix  Status:  Discontinued     1,000 mg 200 mL/hr over 60 Minutes Intravenous  Every 48 hours 12/02/15 0110 12/02/15 0815   12/03/15 0900  vancomycin (VANCOCIN) IVPB 750 mg/150 ml premix  Status:  Discontinued     750 mg 150 mL/hr over 60 Minutes Intravenous Every 48 hours 12/02/15 0815 12/02/15 0844   12/02/15 2200  ceFEPIme (MAXIPIME) 2 g in dextrose 5 % 50 mL IVPB  Status:  Discontinued     2 g 100 mL/hr over 30 Minutes Intravenous Every 24 hours 12/02/15 0110 12/02/15 0829   12/02/15 2200  ceFEPIme (MAXIPIME) 1 g in dextrose 5 % 50 mL IVPB     1 g 100 mL/hr over 30 Minutes Intravenous Every 24 hours 12/02/15 0829     12/02/15 0843  vancomycin (VANCOCIN) 500 mg in sodium chloride 0.9 % 100 mL IVPB  Status:  Discontinued     500 mg 100 mL/hr over 60 Minutes Intravenous Every Dialysis 12/02/15 0844 12/02/15 1356   12/01/15 2045  ceFEPIme (MAXIPIME) 2 g in dextrose 5 % 50 mL IVPB     2 g 100 mL/hr over 30 Minutes Intravenous  Once 12/01/15 2032 12/01/15 2139   12/01/15 2045  vancomycin (VANCOCIN) IVPB 1000 mg/200 mL premix     1,000 mg 200 mL/hr over 60 Minutes Intravenous  Once 12/01/15 2032 12/01/15 2202      Medications   Scheduled Meds: . butamben-tetracaine-benzocaine  1 spray Topical Once  . ceFEPime (MAXIPIME) IV  1 g Intravenous Q24H  . [START ON 12/05/2015] epoetin (EPOGEN/PROCRIT) injection  10,000 Units Intravenous Q M,W,F-HD  . fentaNYL      . ferrous sulfate  325 mg Oral Q breakfast  . furosemide  80 mg Oral Daily  . gabapentin  300 mg Oral TID  . heparin flush      . heparin  5,000 Units Subcutaneous Q8H  .  insulin aspart  0-5 Units Subcutaneous QHS  . insulin aspart  0-9 Units Subcutaneous TID WC  . [START ON 12/05/2015] levofloxacin (LEVAQUIN) IV  500 mg Intravenous Q48H  . lidocaine  1 application Topical Once  . midazolam      . midazolam      . vancomycin  500 mg Intravenous Q M,W,F-HD  . verapamil  40 mg Oral BID   Continuous Infusions:   PRN Meds:.acetaminophen **OR** acetaminophen, benzonatate, fentaNYL, guaiFENesin-codeine,  ipratropium-albuterol, ipratropium-albuterol, midazolam, ondansetron **OR** ondansetron (ZOFRAN) IV, oxyCODONE, phenylephrine   Data Review:   Micro Results Recent Results (from the past 240 hour(s))  Blood Culture (routine x 2)     Status: None (Preliminary result)   Collection Time: 12/01/15  8:42 PM  Result Value Ref Range Status   Specimen Description BLOOD LEFT HAND  Final   Special Requests BOTTLES DRAWN AEROBIC AND ANAEROBIC 5ML  Final   Culture NO GROWTH 3 DAYS  Final   Report Status PENDING  Incomplete  Blood Culture (routine x 2)     Status: None (Preliminary result)   Collection Time: 12/01/15  8:42 PM  Result Value Ref Range Status   Specimen Description BLOOD PORTA CATH  Final   Special Requests BOTTLES DRAWN AEROBIC AND ANAEROBIC 7ML  Final   Culture NO GROWTH 3 DAYS  Final   Report Status PENDING  Incomplete  Urine culture     Status: Abnormal   Collection Time: 12/01/15  8:42 PM  Result Value Ref Range Status   Specimen Description URINE, RANDOM  Final   Special Requests NONE  Final   Culture (A)  Final    <10,000 COLONIES/mL INSIGNIFICANT GROWTH Performed at [facility]    Report Status 12/03/2015 FINAL  Final    Radiology Reports Ct Abdomen Pelvis Wo Contrast  Result Date: 11/14/2015 CLINICAL DATA:  Acute onset of generalized abdominal distention. Generalized weakness and near syncope. Shortness of breath. Current history of multiple myeloma. Chills and vomiting. Initial encounter. EXAM: CT ABDOMEN AND PELVIS WITHOUT CONTRAST TECHNIQUE: Multidetector CT imaging of the abdomen and pelvis was performed following the standard protocol without IV contrast. COMPARISON:  CT of the abdomen and pelvis performed 09/02/2015 FINDINGS: Lower chest: Small bilateral pleural effusions are noted, right greater than left, with bibasilar airspace opacities, possibly reflecting atelectasis or pneumonia. Hepatobiliary: The liver is unremarkable in appearance. Trace ascites is  seen tracking about the liver and spleen. The gallbladder is grossly unremarkable in appearance. The common bile duct remains normal in caliber. Pancreas: Vague soft tissue inflammation is suggested about the pancreas, with fluid tracking inferiorly along the duodenum and Gerota's fascia on the right, concerning for acute pancreatitis. Evaluation for devascularization is limited without contrast. No pseudocysts are seen. Spleen: The spleen is unremarkable in appearance. Adrenals/Urinary Tract: The adrenal glands are grossly unremarkable. Nonspecific perinephric stranding is noted bilaterally. There is no evidence of hydronephrosis. No renal or ureteral stones are identified. Stomach/Bowel: The stomach is grossly unremarkable in appearance. No significant small bowel abnormalities are seen. The appendix is normal in caliber, without evidence of appendicitis. The colon is decompressed and grossly unremarkable in appearance. Vascular/Lymphatic: The vasculature is not well assessed without contrast. Minimal vascular calcification is seen. No retroperitoneal lymphadenopathy is seen. No pelvic sidewall lymphadenopathy is identified. Reproductive: The bladder is mildly distended and grossly unremarkable. The uterus is grossly unremarkable in appearance. The ovaries are relatively symmetric. No suspicious adnexal masses are seen. Other: A small amount of fluid within the  pelvis. Presacral stranding is noted. Musculoskeletal: There is diffuse sclerosis and numerous lytic lesions throughout the visualized osseous structures, with chronic loss of height noted at T11. The visualized musculature is grossly unremarkable in appearance. IMPRESSION: 1. Vague soft tissue inflammation about the pancreas, with fluid tracking inferiorly along the duodenum and Gerota's fascia on the right, concerning for acute pancreatitis. Would correlate with pancreatic lab values. No pseudocyst seen. Evaluation for devascularization is limited without  contrast. 2. Trace ascites noted within the abdomen and pelvis. 3. Small bilateral pleural effusions, right greater than left, with bibasilar airspace opacities possibly reflecting atelectasis or pneumonia. 4. Diffuse sclerosis and numerous lytic lesions throughout the visualized osseous structures, with chronic loss of height at T11. This reflects the patient's known multiple myeloma. Electronically Signed   By: Garald Balding M.D.   On: 11/14/2015 02:32   Dg Chest 1 View  Result Date: 12/04/2015 CLINICAL DATA:  [name] with a history of Multiple Myeloma, she was admitted to the hospital earlier this month with acute renal failure and has been on HD since that time. She has been on velcade. History is obtained via a Patent attorney, she presented to the hospital with intractable cough and chest congestion as well as persistent fever. She was admitted ton 9/27 with possible pneumonia EXAM: CHEST 1 VIEW COMPARISON:  Chest CT, 12/02/2015.  Chest radiograph, 11/11/2015. FINDINGS: Cardiac silhouette is mildly enlarged. No mediastinal or hilar masses. No convincing adenopathy. Tunneled dual lumen central venous catheter on the left has its catheter tip is in the right atrium. Right internal jugular Port-A-Cath has its tip at the caval atrial junction. There is prominence of the bronchovascular markings with hazy ground-glass type opacity in the perihilar and lower lungs similar to the prior exam. No new lung abnormalities. No convincing pleural effusion. No pneumothorax. Skeletal structures are unremarkable. IMPRESSION: 1. Mild hazy perihilar to lower lung zone opacity as well as prominence of the bronchovascular markings. This is similar to the prior chest radiograph. This could reflect atypical infection as suggested on the chest CT. Mild residual edema is possible. There are no new abnormalities. Electronically Signed   By: Lajean Manes M.D.   On: 12/04/2015 11:16   Dg Chest 1  View  Result Date: 11/13/2015 CLINICAL DATA:  Pallor. Abdominal pain and shortness of breath. Multiple myeloma. EXAM: CHEST 1 VIEW COMPARISON:  09/22/2015 FINDINGS: Power injectable right IJ Port-A-Cath tip: SVC. Mild enlargement of the cardiopericardial silhouette with bibasilar airspace opacities obscuring the hemidiaphragms. Old bilateral lateral lower rib fractures. Thoracic spondylosis. IMPRESSION: 1. Obscuration of both hemidiaphragms is increased from 09/22/2015 and may reflect atelectasis or pneumonia. The underlying pleural effusions are not readily excluded either. 2. Mild enlargement of the cardiopericardial silhouette. Electronically Signed   By: Van Clines M.D.   On: 11/13/2015 23:07   Dg Chest 2 View  Result Date: 11/21/2015 CLINICAL DATA:  50 year old with end-stage renal disease on hemodialysis presenting with generalized weakness, cough and shortness of breath. Follow-up bilateral pleural effusions. EXAM: CHEST  2 VIEW COMPARISON:  11/20/2015, 11/13/2015 and earlier, including CT chest 09/02/2015 and earlier. FINDINGS: Cardiac silhouette markedly enlarged. Pulmonary venous hypertension and mild diffuse interstitial pulmonary edema, increased since yesterday. Bilateral pleural effusions, left greater than right, with associated consolidation the lower lobes, slightly increased since yesterday. Left jugular dialysis catheter tips in the right atrium, unchanged. Right jugular Port-A-Cath tip at or near the cavoatrial junction, unchanged. Visualized bony thorax intact. Old compression fracture  of the upper endplate of W41, unchanged. IMPRESSION: Worsening CHF and/or fluid overload, with increased interstitial pulmonary edema and enlarging bilateral pleural effusions since yesterday. Worsening associated passive atelectasis and/or pneumonia involving the lower lobes, left greater than right. Electronically Signed   By: Evangeline Dakin M.D.   On: 11/21/2015 15:59   Dg Chest 2  View  Result Date: 11/20/2015 CLINICAL DATA:  Abdominal pain.  TB. EXAM: CHEST  2 VIEW COMPARISON:  11/13/2015 FINDINGS: Cardiopericardial enlargement that is stable. New dialysis catheter from the left with tips at the right atrium. Small pleural effusions and bibasilar atelectasis, increased. No septal thickening to suggest edema. History of multiple myeloma with spinal sclerosis and chronic T11 compression fracture IMPRESSION: Increased pleural effusions and bibasilar atelectasis. Cannot exclude superimposed pneumonia. Electronically Signed   By: Monte Fantasia M.D.   On: 11/20/2015 14:01   Ct Chest Wo Contrast  Result Date: 12/02/2015 CLINICAL DATA:  Hospitalized for 2 weeks for kidney failure, discharge 11/28/2015. History of multiple myeloma. Persistent dry cough for 3 weeks. Productive cough beginning yesterday. EXAM: CT CHEST WITHOUT CONTRAST TECHNIQUE: Multidetector CT imaging of the chest was performed following the standard protocol without IV contrast. COMPARISON:  Chest CT dated 09/02/2015. Chest x-ray dated 12/01/2015. FINDINGS: Cardiovascular: Heart size is upper normal, stable. No pericardial effusion. Thoracic aorta is normal in caliber. Mediastinum/Nodes: Scattered small lymph nodes appear stable. No masses or enlarged lymph nodes appreciated in the mediastinum or perihilar regions. Esophagus appears normal. Trachea and central bronchi are unremarkable. Lungs/Pleura: Small nodular consolidations and areas of interstitial thickening and/or ground-glass opacity within the central perihilar lungs bilaterally, new compared to the earlier chest CT. More peripheral lungs are clear. Small bilateral pleural effusions with adjacent atelectasis. Upper Abdomen: Limited images of the upper abdomen are unremarkable. Musculoskeletal: The infiltrative paraspinal mass described in the left lower thoracic spine (T9-T10 level) on the previous chest contrast-enhanced CT is difficult to visualize on this  noncontrast exam. Again noted are mixed lytic and sclerotic changes throughout the entire skeleton compatible with previous description of diffuse myeloma. Again noted is an increase in the sclerotic change which may be secondary to interval treatment. There are, however, increase lytic changes also noted in the lower thoracic spine and upper lumbar spine suspicious for progression of disease. IMPRESSION: 1. Scattered small nodular consolidations with interposed areas of ground-glass opacity within the central perihilar lungs bilaterally, new compared to the earlier chest CT of 09/02/2015. This is highly concerning for atypical pneumonia including fungal, pneumocystis or viral (especially if currently immunocompromised). Less likely, this could represent perihilar edema related to volume overload. Neoplastic process cannot be confidently excluded but is also considered less likely given the bilaterality. 2. Diffuse osseous metastases related to patient's known multiple myeloma, overall similar to the previous exam of 09/02/2015. Some increased lytic changes within the lower thoracic spine and upper lumbar spine is suspicious for progression of disease. Areas of increased sclerotic change throughout the skeleton may be secondary to interval treatment. 3. Small bilateral pleural effusions with adjacent atelectasis. Electronically Signed   By: Franki Cabot M.D.   On: 12/02/2015 14:34   Nm Pulmonary Perf And Vent  Result Date: 11/21/2015 CLINICAL DATA:  Subacute onset of dyspnea and shortness of breath. Initial encounter. EXAM: NUCLEAR MEDICINE VENTILATION - PERFUSION LUNG SCAN TECHNIQUE: Ventilation images were obtained in multiple projections using inhaled aerosol Tc-66mDTPA. Perfusion images were obtained in multiple projections after intravenous injection of Tc-96mAA. RADIOPHARMACEUTICALS:  29.814 mCi Technetium-9950mPA aerosol  inhalation and 2.565 mCi Technetium-28mMAA IV COMPARISON:  Chest radiograph  performed earlier today at 3:44 p.m. FINDINGS: Ventilation: Diffuse heterogeneity is noted with regard to ventilation within both lungs, with numerous small peripheral defects seen bilaterally. No large segmental ventilation defects are identified. Perfusion: There is minimal peripheral heterogeneity of perfusion, without a focal defect to suggest pulmonary embolus. Perfusion of both lungs is much more normal in appearance than corresponding ventilation. IMPRESSION: Low probability for pulmonary embolus. Scattered small peripheral ventilation defects, without perfusion abnormality. Electronically Signed   By: JGarald BaldingM.D.   On: 11/21/2015 19:59   Dg Chest Port 1 View  Result Date: 12/01/2015 CLINICAL DATA:  Fever.  Dialysis patient. EXAM: PORTABLE CHEST 1 VIEW COMPARISON:  11/21/2015 FINDINGS: Improved aeration with decrease in pulmonary edema. Improved aeration in the lung bases with decreased atelectasis/infiltrate. Improvement in small bilateral pleural effusions Left jugular dialysis catheter in the right atrium unchanged. Port-A-Cath tip in the SVC unchanged. IMPRESSION: Interval improvement in pulmonary edema. Improvement in bibasilar airspace disease and small bilateral effusions. Electronically Signed   By: CFranchot GalloM.D.   On: 12/01/2015 21:44     CBC  Recent Labs Lab 12/01/15 2042 12/02/15 0416 12/02/15 1909 12/03/15 0550  WBC 5.5 3.6  --  4.0  HGB 7.9* 6.5* 8.9* 8.7*  HCT 22.9* 19.3* 26.3* 25.5*  PLT 126* 98*  --  106*  MCV 88.2 89.6  --  87.7  MCH 30.3 30.4  --  29.9  MCHC 34.3 34.0  --  34.1  RDW 18.2* 18.7*  --  17.7*  LYMPHSABS 0.6*  --   --   --   MONOABS 0.3  --   --   --   EOSABS 0.1  --   --   --   BASOSABS 0.0  --   --   --     Chemistries   Recent Labs Lab 12/01/15 2042 12/02/15 0416 12/03/15 0550  NA 139 139 136  K 3.3* 3.3* 3.5  CL 98* 102 102  CO2 27 27 20*  GLUCOSE 176* 146* 160*  BUN 33* 40* 52*  CREATININE 3.01* 3.68* 4.61*  CALCIUM  8.7* 7.8* 8.2*  AST 142*  --   --   ALT 48  --   --   ALKPHOS 104  --   --   BILITOT 1.4*  --   --    ------------------------------------------------------------------------------------------------------------------ estimated creatinine clearance is 11.4 mL/min (by C-G formula based on SCr of 4.61 mg/dL (H)). ------------------------------------------------------------------------------------------------------------------ No results for input(s): HGBA1C in the last 72 hours. ------------------------------------------------------------------------------------------------------------------ No results for input(s): CHOL, HDL, LDLCALC, TRIG, CHOLHDL, LDLDIRECT in the last 72 hours. ------------------------------------------------------------------------------------------------------------------ No results for input(s): TSH, T4TOTAL, T3FREE, THYROIDAB in the last 72 hours.  Invalid input(s): FREET3 ------------------------------------------------------------------------------------------------------------------ No results for input(s): VITAMINB12, FOLATE, FERRITIN, TIBC, IRON, RETICCTPCT in the last 72 hours.  Coagulation profile No results for input(s): INR, PROTIME in the last 168 hours.  No results for input(s): DDIMER in the last 72 hours.  Cardiac Enzymes No results for input(s): CKMB, TROPONINI, MYOGLOBIN in the last 168 hours.  Invalid input(s): CK ------------------------------------------------------------------------------------------------------------------ Invalid input(s): PFlorence Patient is a 50year old with multiple myeloma presenting with sepsis and pneumonia #1 Sepsis ([place] - Due to pneumonia CT scan shows atypical pneumonia, possible fungal Appreciate infectious disease input status post bronchoscopy further therapy based on findings of her him bronc results and cultures. TB is being ruled out #2 multiple myeloma  with worsening anemia status  post transfusion Hemoglobin stable #3     End stage renal failure on dialysis [facility] - nephrology following for dialysis  #4  HTN (hypertension) - stable, continue home meds #5   Depression - continue home meds #6  multiple myeloma prognosis poor oncology input appreciated     Code Status Orders        Start     Ordered   12/02/15 0111  Full code  Continuous     12/02/15 0110    Code Status History    Date Active Date Inactive Code Status Order ID Comments User Context   11/14/2015 12:52 AM 11/17/2015  1:12 PM Full Code [id number]  Holley Raring, NP ED   11/14/2015 12:06 AM 11/14/2015 12:12 AM Full Code [id number]  Holley Raring, NP ED           Consults  ONCOLOGY   DVT Prophylaxis   heparin  Lab Results  Component Value Date   PLT 106 (L) 12/03/2015     Time Spent in minutes   32MIN  Greater than 50% of time spent in care coordination and counseling patient regarding the condition and plan of care.   Dustin Flock M.D on 12/04/2015 at 2:39 PM  Between 7am to 6pm - Pager - [phone]  After 6pm go to [email or web address] - password [email or web address] [facility]   Office  [phone]

## 2015-12-04 NOTE — Progress Notes (Signed)
Per Dr. Posey Pronto okay to place order for patient to resume previous diet of carb modified and heart healthy. Do not give morning dose of lasix for Low BP okay to give verapamil.

## 2015-12-04 NOTE — Progress Notes (Signed)
Per Dr. Posey Pronto okay not to cover patient at blood glucose of 155 as she will be NPO.

## 2015-12-04 NOTE — Consult Note (Signed)
[name]      Assessment and Plan:  A: Pneumonia/lung nodules with bronchiectatic changes in a 50 yo female with multiple myeloma with recent use of immunosuppression (Velcade). Now with intractable cough, fevers of uncertain etiology.   P: Continue abx and serological workup per ID recs.  --Will plan for bronchoscopy today.  --Symptomatic treatment of cough.    Date: 12/04/2015    Subjective:  New new complaints today, continue to have cough, which has not really improved.    Medication:    Reviewed   Allergies:  No known allergies  Review of Systems: Gen:  Denies  fever, sweats, chills HEENT: Denies blurred vision Cvc:  No dizziness, chest pain. Resp:   Denies sputum production, shortness of breath Gi: Denies swallowing difficulty, Psych: No depression, insomnia. Other:  All other systems were reviewed with the patient and were negative other that what is mentioned in the HPI.   Physical Examination:   VS: BP (!) 107/42 (BP Location: Right Arm)   Pulse (!) 105   Temp 98.1 F (36.7 C) (Oral)   Resp 20   Ht _0  (1.575 m)   Wt 110 lb 8 oz (50.1 kg)   LMP 11/20/1985 (Approximate) Comment: Tubal ligation 1987  SpO2 96%   BMI 20.21 kg/m   General Appearance: No distress, appears comfortable on RA.  Neuro:without focal findings,  speech normal,  HEENT: PERRLA, EOM intact.   Pulmonary: normal breath sounds, No wheezing.  CardiovascularNormal S1,S2.  No m/r/g.   Abdomen: Benign, Soft, non-tender. Renal:  No costovertebral tenderness  GU:  No performed at this time. Endoc: No evident thyromegaly, no signs of acromegaly. Skin:   warm, no rashes, no ecchymosis  Extremities: normal, no cyanosis, clubbing.  Other findings:    LABORATORY PANEL:   CBC  Recent Labs Lab 12/03/15 0550  WBC 4.0  HGB 8.7*  HCT 25.5*  PLT 106*    ------------------------------------------------------------------------------------------------------------------  Chemistries   Recent Labs Lab 12/01/15 2042  12/03/15 0550  NA 139  < > 136  K 3.3*  < > 3.5  CL 98*  < > 102  CO2 27  < > 20*  GLUCOSE 176*  < > 160*  BUN 33*  < > 52*  CREATININE 3.01*  < > 4.61*  CALCIUM 8.7*  < > 8.2*  AST 142*  --   --   ALT 48  --   --   ALKPHOS 104  --   --   BILITOT 1.4*  --   --   < > = values in this interval not displayed. ------------------------------------------------------------------------------------------------------------------  Cardiac Enzymes No results for input(s): TROPONINI in the last 168 hours. ------------------------------------------------------------  RADIOLOGY:  Dg Chest 1 View  Result Date: 12/04/2015 CLINICAL DATA:  [name] with a history of Multiple Myeloma, she was admitted to the hospital earlier this month with acute renal failure and has been on HD since that time. She has been on velcade. History is obtained via a Patent attorney, she presented to the hospital with intractable cough and chest congestion as well as persistent fever. She was admitted ton 9/27 with possible pneumonia EXAM: CHEST 1 VIEW COMPARISON:  Chest CT, 12/02/2015.  Chest radiograph, 11/11/2015. FINDINGS: Cardiac silhouette is mildly enlarged. No mediastinal or hilar masses. No convincing adenopathy. Tunneled dual lumen central venous catheter on the left has its catheter tip is in the right atrium. Right internal jugular Port-A-Cath has its tip  at the caval atrial junction. There is prominence of the bronchovascular markings with hazy ground-glass type opacity in the perihilar and lower lungs similar to the prior exam. No new lung abnormalities. No convincing pleural effusion. No pneumothorax. Skeletal structures are unremarkable. IMPRESSION: 1. Mild hazy perihilar to lower lung zone opacity as well as prominence of the  bronchovascular markings. This is similar to the prior chest radiograph. This could reflect atypical infection as suggested on the chest CT. Mild residual edema is possible. There are no new abnormalities. Electronically Signed   By: Lajean Manes M.D.   On: 12/04/2015 11:16   Ct Chest Wo Contrast  Result Date: 12/02/2015 CLINICAL DATA:  Hospitalized for 2 weeks for kidney failure, discharge 11/28/2015. History of multiple myeloma. Persistent dry cough for 3 weeks. Productive cough beginning yesterday. EXAM: CT CHEST WITHOUT CONTRAST TECHNIQUE: Multidetector CT imaging of the chest was performed following the standard protocol without IV contrast. COMPARISON:  Chest CT dated 09/02/2015. Chest x-ray dated 12/01/2015. FINDINGS: Cardiovascular: Heart size is upper normal, stable. No pericardial effusion. Thoracic aorta is normal in caliber. Mediastinum/Nodes: Scattered small lymph nodes appear stable. No masses or enlarged lymph nodes appreciated in the mediastinum or perihilar regions. Esophagus appears normal. Trachea and central bronchi are unremarkable. Lungs/Pleura: Small nodular consolidations and areas of interstitial thickening and/or ground-glass opacity within the central perihilar lungs bilaterally, new compared to the earlier chest CT. More peripheral lungs are clear. Small bilateral pleural effusions with adjacent atelectasis. Upper Abdomen: Limited images of the upper abdomen are unremarkable. Musculoskeletal: The infiltrative paraspinal mass described in the left lower thoracic spine (T9-T10 level) on the previous chest contrast-enhanced CT is difficult to visualize on this noncontrast exam. Again noted are mixed lytic and sclerotic changes throughout the entire skeleton compatible with previous description of diffuse myeloma. Again noted is an increase in the sclerotic change which may be secondary to interval treatment. There are, however, increase lytic changes also noted in the lower thoracic  spine and upper lumbar spine suspicious for progression of disease. IMPRESSION: 1. Scattered small nodular consolidations with interposed areas of ground-glass opacity within the central perihilar lungs bilaterally, new compared to the earlier chest CT of 09/02/2015. This is highly concerning for atypical pneumonia including fungal, pneumocystis or viral (especially if currently immunocompromised). Less likely, this could represent perihilar edema related to volume overload. Neoplastic process cannot be confidently excluded but is also considered less likely given the bilaterality. 2. Diffuse osseous metastases related to patient's known multiple myeloma, overall similar to the previous exam of 09/02/2015. Some increased lytic changes within the lower thoracic spine and upper lumbar spine is suspicious for progression of disease. Areas of increased sclerotic change throughout the skeleton may be secondary to interval treatment. 3. Small bilateral pleural effusions with adjacent atelectasis. Electronically Signed   By: Franki Cabot M.D.   On: 12/02/2015 14:34       Thank  you for the consultation and for allowing [facility] to assist in the care of your patient. Our recommendations are noted above.  Please contact us if we can be of further service.   Marda Stalker, MD.  Board Certified in Internal Medicine, Pulmonary Medicine, [place], and Sleep Medicine.  [facility] Office Number: [phone]  Patricia Pesa, M.D.  Vilinda Boehringer, M.D.  Merton Border, M.D  12/04/2015

## 2015-12-04 NOTE — Consult Note (Signed)
GYNECOLOGY CONSULT NOTE  GYN Consultation  Attending Provider: Dustin Flock, MD   Zakia Sainato [phone] 12/04/2015 3:48 PM    Reason for Consultation:   Jamie Keller is a 50 y.o. [phone] female seen at the request of the above attending provider for postmenopausal vaginal bleeding.  History of Present Ilness:   The patient is admitted for a cough and suspected pulmonary infection. However, no definite source of her infection has been found.  She has a history notable for progressive multiple myeloma, which she states has been present for two years.  She has undergone chemotherapy for this.  She states that she has had no menses for four years.  She states that even prior to that she was having hot flashes.  She had no menstrual or vaginal bleeding of any sort in that time period.  However, six days ago she started having a red-pink tinged discharge, which is new for her. Today, while having a severe coughing spell she noted the passage of several blood clots vaginally.  She reports having cramps associated with this bleeding that felt to her like menstrual cramps.  Her last pap smear was 2-3 years ago and she believes it was normal, as she was told she would not have to have another pap smear for another five years.  She denies other vaginal symptoms such as itching, burning, and irritation.  She has had four pregnancies and four children.  Her first two were uncomplicated vaginal deliveries.  Her second two deliveries were accomplished by cesarean section.  She notes some mild constipation currently, no diarrhea. She has not had changes in her weight.  She has a poor appetite and has for a while.  She notes occasional nausea and vomiting.  She continues to have a cough.   Past Medical History:  Diagnosis Date  . Diabetes mellitus without complication ([place])   . Hypertension   . Multiple myeloma ([place])   . Osteoarthritis   . Post-menopausal 2014   Past Surgical History:   Procedure Laterality Date  . CESAREAN SECTION     x2  . PERIPHERAL VASCULAR CATHETERIZATION N/A 11/19/2015   Procedure: Dialysis/Perma Catheter Insertion;  Surgeon: Algernon Huxley, MD;  Location: [facility];  Service: Cardiovascular;  Laterality: N/A;   Allergies  Allergen Reactions  . No Known Allergies    Prior to Admission medications   Medication Sig Start Date End Date Taking? Authorizing Provider  ferrous sulfate 325 (65 FE) MG tablet Take 1 tablet (325 mg total) by mouth daily with breakfast. 11/28/15  Yes Srikar Sudini, MD  furosemide (LASIX) 80 MG tablet Take 1 tablet (80 mg total) by mouth daily. 11/29/15  Yes Srikar Sudini, MD  gabapentin (NEURONTIN) 300 MG capsule Take 1 capsule (300 mg total) by mouth 3 (three) times daily. 11/06/15  Yes Lloyd Huger, MD  Oxycodone HCl 10 MG TABS Take 1 tablet (10 mg total) by mouth every 6 (six) hours as needed. Patient taking differently: Take 10 mg by mouth every 6 (six) hours as needed (pain).  10/03/15  Yes Lloyd Huger, MD  verapamil (CALAN) 40 MG tablet Take 1 tablet (40 mg total) by mouth 2 (two) times daily. 11/28/15  Yes Hillary Bow, MD    Inpatient Medications:   Current Facility-Administered Medications (Endocrine & Metabolic):  .  insulin aspart (novoLOG) injection 0-5 Units .  insulin aspart (novoLOG) injection 0-9 Units   Current Facility-Administered Medications (Cardiovascular):  .  furosemide (LASIX) tablet  80 mg .  verapamil (CALAN) tablet 40 mg   Current Facility-Administered Medications (Respiratory):  .  benzonatate (TESSALON) capsule 200 mg .  guaiFENesin-codeine 100-10 MG/5ML solution 10 mL .  ipratropium-albuterol (DUONEB) 0.5-2.5 (3) MG/3ML nebulizer solution 3 mL .  ipratropium-albuterol (DUONEB) 0.5-2.5 (3) MG/3ML nebulizer solution 3 mL .  phenylephrine (NEO-SYNEPHRINE) 0.25 % nasal spray 1 spray   Current Facility-Administered Medications (Analgesics):  .  acetaminophen (TYLENOL)  tablet 650 mg **OR** acetaminophen (TYLENOL) suppository 650 mg .  fentaNYL (SUBLIMAZE) 100 MCG/2ML injection .  fentaNYL (SUBLIMAZE) injection .  oxyCODONE (Oxy IR/ROXICODONE) immediate release tablet 10 mg   Current Facility-Administered Medications (Hematological):  Marland Kitchen  [START ON 12/05/2015] epoetin alfa (EPOGEN,PROCRIT) injection 10,000 Units .  ferrous sulfate tablet 325 mg .  heparin flush 10 UNIT/ML injection .  heparin injection 5,000 Units   Current Facility-Administered Medications (Other):  .  butamben-tetracaine-benzocaine (CETACAINE) spray 1 spray .  ceFEPIme (MAXIPIME) 1 g in dextrose 5 % 50 mL IVPB .  gabapentin (NEURONTIN) capsule 300 mg .  [COMPLETED] levofloxacin (LEVAQUIN) IVPB 750 mg **FOLLOWED BY** [START ON 12/05/2015] levofloxacin (LEVAQUIN) IVPB 500 mg .  lidocaine (XYLOCAINE) 2 % jelly 1 application .  midazolam (VERSED) 5 MG/5ML injection .  midazolam (VERSED) 5 MG/5ML injection .  midazolam (VERSED) injection .  ondansetron (ZOFRAN) tablet 4 mg **OR** ondansetron (ZOFRAN) injection 4 mg .  vancomycin (VANCOCIN) 500 mg in sodium chloride 0.9 % 100 mL IVPB  No current outpatient prescriptions on file.    Obstetric History: As noted above, G4P4003 with one of her children having passed away after birth.  She is status post SVD with G1 and G2. She is status post cesarean delivery with G3 and G4.    Gynecologic History:   Prior to her current episode, her LMP was some time four years ago.  Prior to her LMP, she states that her menses were regular, coming monthly.  Denies STDs. Denies a history of abnormal pap smears.   Social History:  She  reports that she has never smoked. She has never used smokeless tobacco. She reports that she does not drink alcohol or use drugs.  Family History:  family history includes Hypercholesterolemia in her mother; Hypertension in her father.   Review of Systems:  Negative x 10 systems reviewed except as noted in the HPI.     Objective    BP (!) 127/57   Pulse (!) 112   Temp 98.6 F (37 C) (Oral)   Resp 20   Ht 5' 2"  (1.575 m)   Wt 110 lb 8 oz (50.1 kg)   LMP 11/20/1985 (Approximate) Comment: Tubal ligation 1987  SpO2 97%   BMI 20.21 kg/m  Physical Exam  General:  She is a ill appearing female in no distress. Frequent coughing episodes HEENT:  Normocephalic, atraumatic.   Neck:  supple, no lymphadenopathy Cardiac:  Tachycardic, rhythm regular. Pulmonary:  Scattered rhonchi Abdomen:  Soft, non-tender, non-distended, +BS, no masses palpated, no rebound or guarding  Pelvic:  Deferred at this time. Extremities:  Non-tender, symmetric no edema bilaterally.   Neurologic:  Alert & oriented x 3.  Appropriate, conversant.   Skin: scattered hematomas   Laboratory Results:   Lab Results  Component Value Date   WBC 4.0 12/03/2015   RBC 2.90 (L) 12/03/2015   HGB 8.7 (L) 12/03/2015   HCT 25.5 (L) 12/03/2015   PLT 106 (L) 12/03/2015   NA 136 12/03/2015   K 3.5 12/03/2015   CREATININE  4.61 (H) 12/03/2015   Imaging Results:  CT Abdomen/Pelvis reports on 09/02/15 (with contrast) and 11/14/15 (without contrast) - both report normal-appearing uterus and no adnexal masses.   Assessment & Recommendations   Jamie Keller is a 50 y.o. [phone] admitted for sepsis and likely pneumonia in the setting of advanced multiple myeloma, with an apparently poor prognosis.  Vaginal bleeding source could be from external skin, vagina, cervix, uterus.  She reports the onset of menopause prior to her diagnosis of multiple myeloma and treatment, which appears to have consisted of chemotherapy.  Therefore, she is likely to be postmenopausal.  Earlier notes in her chart, even recent ones, indicates that she was taking megace (a potent progestin), which she appears to no longer be taking.  I am unsure of how long she took that medication. However, it is considered protective against the usual causes of endometrial cancer.  If  she were not truly menopausal, stopping megace could have led to a withdrawal bleed.  Not knowing her chemotherapy agents, it is difficult to say whether these medications would have caused her to be menopausal, if she were not already.  Given her multiple and very serious other comorbid conditions, aggressively pursuing a diagnosis of her vaginal bleeding does not appear to be the top priority.  Certainly a malignancy could be present. However, vaginal causes, cervical lesions, and any number of uterine causes could all be explanations for her bleeding.  I do not identify any medications that could cause vaginal bleeding.  I recommend the following:  - Pelvic ultrasound during this hospitalization to better assess her uterus and adnexa. - no other treatment at this time, unless she continues to bleed and it is bothersome, heavier bleeding occurs, or if the primary team is concerned that her bleeding is contributing to worsening anemia.  In that case, she could be restarted on Megace with the same dosing as before.  Also, if her bleeding should become worse, a pelvic exam could be performed while she is admitted.   - Follow up as an outpatient to better perform a pelvic exam and investigate her bleeding. I have offered to her that she could follow up with me after she is discharged.  I strongly encouraged her to be evaluated for her bleeding.  - If her disease state progresses to a palliative care stage, obviously this level of workup would not be required.   Thank you for this consultation.  I am happy to participate in the care of your patient.  I am happy to continue to follow along with this patient or an an as-needed basis.   Will Bonnet, MD 12/04/2015 3:48 PM

## 2015-12-04 NOTE — Progress Notes (Signed)
Jamie Keller Ranae Palms is a 50 y.o. female with MM on chemo and cough, fever Principal Problem:   Sepsis ([place]) Active Problems:   Multiple myeloma ([place])   HCAP (healthcare-associated pneumonia)   End stage renal failure on dialysis ([place])   HTN (hypertension)   Depression   Subjective: Just had bronch. Sleepy. Low grade fevers  ROS  Eleven systems are reviewed and negative except per hpi  Medications:  Antibiotics Given (last 72 hours)    Date/Time Action Medication Dose Rate   12/02/15 2303 Given   ceFEPIme (MAXIPIME) 1 g in dextrose 5 % 50 mL IVPB 1 g 100 mL/hr   12/03/15 1159 Given   vancomycin (VANCOCIN) 500 mg in sodium chloride 0.9 % 100 mL IVPB 500 mg 100 mL/hr   12/03/15 1851 Given   levofloxacin (LEVAQUIN) IVPB 750 mg 750 mg 100 mL/hr   12/03/15 2249 Given   ceFEPIme (MAXIPIME) 1 g in dextrose 5 % 50 mL IVPB 1 g 100 mL/hr     . butamben-tetracaine-benzocaine  1 spray Topical Once  . ceFEPime (MAXIPIME) IV  1 g Intravenous Q24H  . [START ON 12/05/2015] epoetin (EPOGEN/PROCRIT) injection  10,000 Units Intravenous Q M,W,F-HD  . fentaNYL      . ferrous sulfate  325 mg Oral Q breakfast  . furosemide  80 mg Oral Daily  . gabapentin  300 mg Oral TID  . heparin flush      . heparin  5,000 Units Subcutaneous Q8H  . insulin aspart  0-5 Units Subcutaneous QHS  . insulin aspart  0-9 Units Subcutaneous TID WC  . [START ON 12/05/2015] levofloxacin (LEVAQUIN) IV  500 mg Intravenous Q48H  . lidocaine  1 application Topical Once  . midazolam      . midazolam      . vancomycin  500 mg Intravenous Q M,W,F-HD  . verapamil  40 mg Oral BID    Objective: Vital signs in last 24 hours: Temp:  [98.5 F (36.9 C)-100.4 F (38 C)] 98.5 F (36.9 C) (09/28 0541) Pulse Rate:  [90-119] 100 (09/28 1110) Resp:  [15-29] 23 (09/28 1110) BP: (98-169)/(42-71) 107/47 (09/28 1100) SpO2:  [83 %-100 %] 95 %  (09/28 1110) Weight:  [49.4 kg (108 lb 14.5 oz)-50.1 kg (110 lb 8 oz)] 50.1 kg (110 lb 8 oz) (09/27 1316) Constitutional:  oriented to person, place, and time. Thin, in mild resp distress, has repeated dry cough HENT: [place]/AT, PERRLA, no scleral icterus Mouth/Throat: Oropharynx is clear and moist. No oropharyngeal exudate.  Cardiovascular: Normal rate, regular rhythm and normal heart sounds.  Pulmonary/Chest: Poor air movement throughout Neck supple, no nuchal rigidity Abdominal: Soft. Bowel sounds are normal.  exhibits no distension. There is no tenderness.  Lymphadenopathy: no cervical adenopathy. No axillary adenopathy Neurological: alert and oriented to person, place, and time.  Skin: Skin is warm and dry. No rash noted. No erythema.  Psychiatric: a normal mood and affect.  behavior is normal.   Lab Results  Recent Labs  12/02/15 0416 12/02/15 1909 12/03/15 0550  WBC 3.6  --  4.0  HGB 6.5* 8.9* 8.7*  HCT 19.3* 26.3* 25.5*  NA 139  --  136  K 3.3*  --  3.5  CL 102  --  102  CO2 27  --  20*  BUN 40*  --  52*  CREATININE 3.68*  --  4.61*    Microbiology: Results  for orders placed or performed during the hospital encounter of 12/01/15  Blood Culture (routine x 2)     Status: None (Preliminary result)   Collection Time: 12/01/15  8:42 PM  Result Value Ref Range Status   Specimen Description BLOOD LEFT HAND  Final   Special Requests BOTTLES DRAWN AEROBIC AND ANAEROBIC 5ML  Final   Culture NO GROWTH 3 DAYS  Final   Report Status PENDING  Incomplete  Blood Culture (routine x 2)     Status: None (Preliminary result)   Collection Time: 12/01/15  8:42 PM  Result Value Ref Range Status   Specimen Description BLOOD PORTA CATH  Final   Special Requests BOTTLES DRAWN AEROBIC AND ANAEROBIC 7ML  Final   Culture NO GROWTH 3 DAYS  Final   Report Status PENDING  Incomplete  Urine culture     Status: Abnormal   Collection Time: 12/01/15  8:42 PM  Result Value Ref Range Status    Specimen Description URINE, RANDOM  Final   Special Requests NONE  Final   Culture (A)  Final    <10,000 COLONIES/mL INSIGNIFICANT GROWTH Performed at [facility]    Report Status 12/03/2015 FINAL  Final    Studies/Results: Dg Chest 1 View  Result Date: 12/04/2015 CLINICAL DATA:  [name] with a history of Multiple Myeloma, she was admitted to the hospital earlier this month with acute renal failure and has been on HD since that time. She has been on velcade. History is obtained via a Patent attorney, she presented to the hospital with intractable cough and chest congestion as well as persistent fever. She was admitted ton 9/27 with possible pneumonia EXAM: CHEST 1 VIEW COMPARISON:  Chest CT, 12/02/2015.  Chest radiograph, 11/11/2015. FINDINGS: Cardiac silhouette is mildly enlarged. No mediastinal or hilar masses. No convincing adenopathy. Tunneled dual lumen central venous catheter on the left has its catheter tip is in the right atrium. Right internal jugular Port-A-Cath has its tip at the caval atrial junction. There is prominence of the bronchovascular markings with hazy ground-glass type opacity in the perihilar and lower lungs similar to the prior exam. No new lung abnormalities. No convincing pleural effusion. No pneumothorax. Skeletal structures are unremarkable. IMPRESSION: 1. Mild hazy perihilar to lower lung zone opacity as well as prominence of the bronchovascular markings. This is similar to the prior chest radiograph. This could reflect atypical infection as suggested on the chest CT. Mild residual edema is possible. There are no new abnormalities. Electronically Signed   By: Lajean Manes M.D.   On: 12/04/2015 11:16   Ct Chest Wo Contrast  Result Date: 12/02/2015 CLINICAL DATA:  Hospitalized for 2 weeks for kidney failure, discharge 11/28/2015. History of multiple myeloma. Persistent dry cough for 3 weeks. Productive cough beginning yesterday. EXAM: CT  CHEST WITHOUT CONTRAST TECHNIQUE: Multidetector CT imaging of the chest was performed following the standard protocol without IV contrast. COMPARISON:  Chest CT dated 09/02/2015. Chest x-ray dated 12/01/2015. FINDINGS: Cardiovascular: Heart size is upper normal, stable. No pericardial effusion. Thoracic aorta is normal in caliber. Mediastinum/Nodes: Scattered small lymph nodes appear stable. No masses or enlarged lymph nodes appreciated in the mediastinum or perihilar regions. Esophagus appears normal. Trachea and central bronchi are unremarkable. Lungs/Pleura: Small nodular consolidations and areas of interstitial thickening and/or ground-glass opacity within the central perihilar lungs bilaterally, new compared to the earlier chest CT. More peripheral lungs are clear. Small bilateral pleural effusions with adjacent atelectasis. Upper Abdomen:  Limited images of the upper abdomen are unremarkable. Musculoskeletal: The infiltrative paraspinal mass described in the left lower thoracic spine (T9-T10 level) on the previous chest contrast-enhanced CT is difficult to visualize on this noncontrast exam. Again noted are mixed lytic and sclerotic changes throughout the entire skeleton compatible with previous description of diffuse myeloma. Again noted is an increase in the sclerotic change which may be secondary to interval treatment. There are, however, increase lytic changes also noted in the lower thoracic spine and upper lumbar spine suspicious for progression of disease. IMPRESSION: 1. Scattered small nodular consolidations with interposed areas of ground-glass opacity within the central perihilar lungs bilaterally, new compared to the earlier chest CT of 09/02/2015. This is highly concerning for atypical pneumonia including fungal, pneumocystis or viral (especially if currently immunocompromised). Less likely, this could represent perihilar edema related to volume overload. Neoplastic process cannot be confidently  excluded but is also considered less likely given the bilaterality. 2. Diffuse osseous metastases related to patient's known multiple myeloma, overall similar to the previous exam of 09/02/2015. Some increased lytic changes within the lower thoracic spine and upper lumbar spine is suspicious for progression of disease. Areas of increased sclerotic change throughout the skeleton may be secondary to interval treatment. 3. Small bilateral pleural effusions with adjacent atelectasis. Electronically Signed   By: Franki Cabot M.D.   On: 12/02/2015 14:34    Assessment/Plan: Bryann Gentz is a 50 y.o. female immunocompromised patient with MM on Velcade admitted with a week of cough and fevers. She has diffuse nodular findings and Ground glass opacities on CT and a dry cough. Maintaining sats but appears dyspneic.  I am very concerned for a progressive atypical infection given immunocompromise. Differential is broad including atypical bacterial pathogens, fungal (Histo, crypto, blasto, aspergillus), PCP, viral or mycobacterial.  Pulmonary toxicity from chemo agent is also possible given velcade use.   While she has a neg PPD, she is from an endemic area and was noted to have a calcified granuloma on prior imaging so TB remains in the differential although this is a relatively acute presentation. Bronch done today  Recommendations Cont vanco cefepime, levo for atypical coverage Cultures pending Check HIV, fungal serology, crypto ag, urine histo ag, Urine legionella, QFG, mycoplasma and chlamydia serology If worsens will add voriconazole May benefit from steroids but would like to r/o infection first.   Thank you very much for the consult. Will follow with you.  FITZGERALD, DAVID P   12/04/2015, 11:36 AM

## 2015-12-04 NOTE — Progress Notes (Signed)
[facility]  ROUNDING NOTE   Subjective:  Patient is status post bronchoscopy. She completed hemodialysis yesterday. Resting comfortably in bed at the moment.  Objective:  Vital signs in last 24 hours:  Temp:  [98.1 F (36.7 C)-100.4 F (38 C)] 98.6 F (37 C) (09/28 1432) Pulse Rate:  [90-112] 112 (09/28 1432) Resp:  [15-29] 20 (09/28 1139) BP: (98-127)/(42-71) 127/57 (09/28 1432) SpO2:  [83 %-100 %] 97 % (09/28 1432)  Weight change:  Filed Weights   12/03/15 0921 12/03/15 1303 12/03/15 1316  Weight: 51.3 kg (113 lb 1.5 oz) 49.4 kg (108 lb 14.5 oz) 50.1 kg (110 lb 8 oz)    Intake/Output: I/O last 3 completed shifts: In: 414 [P.O.:360; IV Piggyback:54] Out: 2075 [Urine:575; Other:1500]   Intake/Output this shift:  No intake/output data recorded.  Physical Exam: General: No acute distress  Head: Normocephalic, atraumatic. Moist oral mucosal membranes  Eyes: Anicteric  Neck: Supple, trachea midline  Lungs:  Bilateral rales, normal effort  Heart: S1S2 no rubs  Abdomen:  Soft, non tender, BS present  Extremities: Trace peripheral edema.  Neurologic: Nonfocal, moving all four extremities  Skin: No lesions  Access: [facility] permcath 9/13 Dr. Lucky Cowboy, rt IJ port    Basic Metabolic Panel:  Recent Labs Lab 12/01/15 2042 12/02/15 0416 12/03/15 0550  NA 139 139 136  K 3.3* 3.3* 3.5  CL 98* 102 102  CO2 27 27 20*  GLUCOSE 176* 146* 160*  BUN 33* 40* 52*  CREATININE 3.01* 3.68* 4.61*  CALCIUM 8.7* 7.8* 8.2*  PHOS  --   --  4.9*    Liver Function Tests:  Recent Labs Lab 12/01/15 2042  AST 142*  ALT 48  ALKPHOS 104  BILITOT 1.4*  PROT 7.6  ALBUMIN 3.2*   No results for input(s): LIPASE, AMYLASE in the last 168 hours. No results for input(s): AMMONIA in the last 168 hours.  CBC:  Recent Labs Lab 12/01/15 2042 12/02/15 0416 12/02/15 1909 12/03/15 0550  WBC 5.5 3.6  --  4.0  NEUTROABS 4.5  --   --   --   HGB 7.9* 6.5* 8.9* 8.7*  HCT 22.9*  19.3* 26.3* 25.5*  MCV 88.2 89.6  --  87.7  PLT 126* 98*  --  106*    Cardiac Enzymes: No results for input(s): CKTOTAL, CKMB, CKMBINDEX, TROPONINI in the last 168 hours.  BNP: Invalid input(s): POCBNP  CBG:  Recent Labs Lab 12/03/15 1317 12/03/15 1639 12/03/15 2157 12/04/15 0725 12/04/15 1147  GLUCAP 111* 182* 136* 155* 161*    Microbiology: Results for orders placed or performed during the hospital encounter of 12/01/15  Blood Culture (routine x 2)     Status: None (Preliminary result)   Collection Time: 12/01/15  8:42 PM  Result Value Ref Range Status   Specimen Description BLOOD LEFT HAND  Final   Special Requests BOTTLES DRAWN AEROBIC AND ANAEROBIC 5ML  Final   Culture NO GROWTH 3 DAYS  Final   Report Status PENDING  Incomplete  Blood Culture (routine x 2)     Status: None (Preliminary result)   Collection Time: 12/01/15  8:42 PM  Result Value Ref Range Status   Specimen Description BLOOD PORTA CATH  Final   Special Requests BOTTLES DRAWN AEROBIC AND ANAEROBIC 7ML  Final   Culture NO GROWTH 3 DAYS  Final   Report Status PENDING  Incomplete  Urine culture     Status: Abnormal   Collection Time: 12/01/15  8:42 PM  Result Value Ref Range Status   Specimen Description URINE, RANDOM  Final   Special Requests NONE  Final   Culture (A)  Final    <10,000 COLONIES/mL INSIGNIFICANT GROWTH Performed at [facility]    Report Status 12/03/2015 FINAL  Final    Coagulation Studies: No results for input(s): LABPROT, INR in the last 72 hours.  Urinalysis:  Recent Labs  12/01/15 2042  COLORURINE STRAW*  LABSPEC 1.008  PHURINE 9.0*  GLUCOSEU 150*  HGBUR 2+*  BILIRUBINUR NEGATIVE  KETONESUR NEGATIVE  PROTEINUR 100*  NITRITE NEGATIVE  LEUKOCYTESUR NEGATIVE      Imaging: Dg Chest 1 View  Result Date: 12/04/2015 CLINICAL DATA:  [name] with a history of Multiple Myeloma, she was admitted to the hospital earlier this month with  acute renal failure and has been on HD since that time. She has been on velcade. History is obtained via a Patent attorney, she presented to the hospital with intractable cough and chest congestion as well as persistent fever. She was admitted ton 9/27 with possible pneumonia EXAM: CHEST 1 VIEW COMPARISON:  Chest CT, 12/02/2015.  Chest radiograph, 11/11/2015. FINDINGS: Cardiac silhouette is mildly enlarged. No mediastinal or hilar masses. No convincing adenopathy. Tunneled dual lumen central venous catheter on the left has its catheter tip is in the right atrium. Right internal jugular Port-A-Cath has its tip at the caval atrial junction. There is prominence of the bronchovascular markings with hazy ground-glass type opacity in the perihilar and lower lungs similar to the prior exam. No new lung abnormalities. No convincing pleural effusion. No pneumothorax. Skeletal structures are unremarkable. IMPRESSION: 1. Mild hazy perihilar to lower lung zone opacity as well as prominence of the bronchovascular markings. This is similar to the prior chest radiograph. This could reflect atypical infection as suggested on the chest CT. Mild residual edema is possible. There are no new abnormalities. Electronically Signed   By: Lajean Manes M.D.   On: 12/04/2015 11:16     Medications:     . butamben-tetracaine-benzocaine  1 spray Topical Once  . ceFEPime (MAXIPIME) IV  1 g Intravenous Q24H  . [START ON 12/05/2015] epoetin (EPOGEN/PROCRIT) injection  10,000 Units Intravenous Q M,W,F-HD  . fentaNYL      . ferrous sulfate  325 mg Oral Q breakfast  . furosemide  80 mg Oral Daily  . gabapentin  300 mg Oral TID  . heparin flush      . heparin  5,000 Units Subcutaneous Q8H  . insulin aspart  0-5 Units Subcutaneous QHS  . insulin aspart  0-9 Units Subcutaneous TID WC  . [START ON 12/05/2015] levofloxacin (LEVAQUIN) IV  500 mg Intravenous Q48H  . lidocaine  1 application Topical Once  . midazolam      . midazolam       . vancomycin  500 mg Intravenous Q M,W,F-HD  . verapamil  40 mg Oral BID   acetaminophen **OR** acetaminophen, benzonatate, fentaNYL, guaiFENesin-codeine, ipratropium-albuterol, ipratropium-albuterol, midazolam, ondansetron **OR** ondansetron (ZOFRAN) IV, oxyCODONE, phenylephrine  Assessment/ Plan:  50 y.o.hispanic female with diabetes mellitus type 2, hypertension, multiple myeloma on chemotherapy, osteoarthritis, chronic kidney disease stage III   1. Acute renal failure on CKD stage III baseline Cr 1.4 from 8/28/217.  Concern for progression of myeloma kidney. Nonoliguric urine output. - 24 hour for creatinine clearance 7.   - patient completed hemodialysis yesterday.  No acute indication for dialysis at the moment.  We will plan forhemodialysis again  tomorrow.   2. 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 With thrombocytopenia.  - Oncology: Dr. Grayland Ormond -  Hemoglobin currently 8.7.  Continue Epogen with dialysis.  3. Multiple Myeloma:  Patient appears to have had progression of her underlying multiple myeloma.  Await further input from Dr. Grayland Ormond.  4.  Pneumonia, bacterial vs fungal.  J18.9.  Patient found to have atypical pattern of pneumonitis on chest CT.  She has underlying multiple myeloma and can be considered as being immunosuppressed.  Status post bronchoscopy 12/04/15 - patient status post bronchoscopy today.  Continue broad-spectrum antibiotics including cefepime, Levaquin,and vancomycin.     LOS: 3 Jamie Keller 9/28/20172:44 PM

## 2015-12-04 NOTE — Procedures (Signed)
  [facility]            Bronchoscopy Note   FINDINGS/SUMMARY:  -Moderately erythematous mucosa throughout both lungs with moderate mucosal secretions consistent with acute bronchitis. -Transbronchial biopsies taken of right middle lobe, sent for micro  and cytology. -BAL taken from right middle lobe and lingula sent for micro and cytology.  Indication:  The patient (or their representative) was informed of the risks (including but not limited to bleeding, infection, respiratory failure, lung injury, tooth/oral injury) and benefits of the procedure and gave consent, see chart.   Pre-op diagnosis: Pneumonia Post-op diagnosis: Same Estimated blood loss: 20 mL  Medications for procedure: Versed, fentanyl.  Procedure description:  The patient was brought to the [place] week, and interpreter was present, timeout was obtained to confirm the patient and procedure. After obtaining informed consent, the procedure was initiated under conscious sedation. The right and left nares were checked for patency. The left nares appeared to be were patent. Therefore, this nares was chosen, anesthetized with topical lidocaine. Subsequently, the bronchoscope was passed via the left nares to the posterior pharynx. Normal movements of the vocal cords was appreciated. Subsequently, the bronchoscope was passed into the trachea, extra lidocaine was applied to the main trachea as well as the right and left mainstem. An anatomic tour was undertaken, all segments were visualized, there was no anatomical defects, no endobronchial lesions, there was moderate mucosal secretions throughout both lungs which were thin and easily suctioned. In addition, there was moderate to severe mucosal erythema consistent with acute bronchitis. The bronchoscope was then wedged into the right middle lobe. Transbronchial forceps biopsies were taken 5, carefully time with patient's respiratory cycle. 3 were placed and cytology,  2 were placed for microbiology. Subsequently, a BAL was obtained from the right middle lobe to be sent for both cytology and microbiology including AFB, quantitative cultures, HSV culture, fungal culture. Similar samples were obtained after wedging the bronchoscope into the lingula and again obtaining a bronchoalveolar lavage from this area. As adequate samples appear to have been obtained. The bronchoscope was removed, which postoperative chest x-ray was ordered.  Condition post procedure: Stable   Complications: None noted  Patient instructions: Transfer back to general medical floor.   Marda Stalker, MD.  Board Certified in Internal Medicine, Pulmonary Medicine, [place], and Sleep Medicine.  [facility] Office Number: [phone]  Patricia Pesa, M.D.  Vilinda Boehringer, M.D.  Cheral Marker, M.D  12/04/2015

## 2015-12-04 NOTE — OR Nursing (Signed)
Dr Chauncy Passy notified that pt had SQ Heparin at 0600, OK proceed.

## 2015-12-05 LAB — ACID FAST SMEAR (AFB): ACID FAST SMEAR - AFSCU2: NEGATIVE

## 2015-12-05 LAB — CBC
HCT: 26.1 % — ABNORMAL LOW (ref 35.0–47.0)
HEMOGLOBIN: 8.9 g/dL — AB (ref 12.0–16.0)
MCH: 30 pg (ref 26.0–34.0)
MCHC: 34 g/dL (ref 32.0–36.0)
MCV: 88.4 fL (ref 80.0–100.0)
PLATELETS: 113 10*3/uL — AB (ref 150–440)
RBC: 2.95 MIL/uL — AB (ref 3.80–5.20)
RDW: 17.8 % — ABNORMAL HIGH (ref 11.5–14.5)
WBC: 6.3 10*3/uL (ref 3.6–11.0)

## 2015-12-05 LAB — GLUCOSE, CAPILLARY
GLUCOSE-CAPILLARY: 167 mg/dL — AB (ref 65–99)
GLUCOSE-CAPILLARY: 167 mg/dL — AB (ref 65–99)
GLUCOSE-CAPILLARY: 237 mg/dL — AB (ref 65–99)

## 2015-12-05 LAB — ACID FAST SMEAR (AFB, MYCOBACTERIA)

## 2015-12-05 LAB — PHOSPHORUS: Phosphorus: 4.4 mg/dL (ref 2.5–4.6)

## 2015-12-05 LAB — LEGIONELLA PNEUMOPHILA SEROGP 1 UR AG: L. pneumophila Serogp 1 Ur Ag: NEGATIVE

## 2015-12-05 MED ORDER — ENSURE ENLIVE PO LIQD
237.0000 mL | Freq: Two times a day (BID) | ORAL | Status: DC
  Administered 2015-12-07 (×2): 237 mL via ORAL

## 2015-12-05 MED ORDER — DEXTROSE 5 % IV SOLN
2.0000 g | INTRAVENOUS | Status: DC
  Filled 2015-12-05: qty 2

## 2015-12-05 MED ORDER — HYDROCOD POLST-CPM POLST ER 10-8 MG/5ML PO SUER
5.0000 mL | Freq: Two times a day (BID) | ORAL | Status: DC
  Administered 2015-12-05 – 2015-12-07 (×5): 5 mL via ORAL
  Filled 2015-12-05 (×5): qty 5

## 2015-12-05 NOTE — Progress Notes (Signed)
[place] [facility] [place] NAME: Jamie Keller    MR#:  [id number]  DATE OF BIRTH:  1965-09-09  SUBJECTIVE:   Via interpreter  Cough-dry no fever No vaginal bleeding REVIEW OF SYSTEMS:   Review of Systems  Constitutional: Positive for malaise/fatigue. Negative for chills, fever and weight loss.  HENT: Negative for ear discharge, ear pain and nosebleeds.   Eyes: Negative for blurred vision, pain and discharge.  Respiratory: Positive for cough. Negative for sputum production, shortness of breath, wheezing and stridor.   Cardiovascular: Negative for chest pain, palpitations, orthopnea and PND.  Gastrointestinal: Negative for abdominal pain, diarrhea, nausea and vomiting.  Genitourinary: Negative for frequency and urgency.  Musculoskeletal: Negative for back pain and joint pain.  Neurological: Positive for weakness. Negative for sensory change, speech change and focal weakness.  Psychiatric/Behavioral: Negative for depression and hallucinations. The patient is not nervous/anxious.    Tolerating Diet:yes Tolerating PT: ambulatory  DRUG ALLERGIES:   Allergies  Allergen Reactions  . No Known Allergies     VITALS:  Blood pressure (!) 103/49, pulse 87, temperature 98.9 F (37.2 C), temperature source Oral, resp. rate 18, height 5' 2"  (1.575 m), weight 49.6 kg (109 lb 4.8 oz), last menstrual period 11/20/1985, SpO2 95 %.  PHYSICAL EXAMINATION:   Physical Exam  GENERAL:  50 y.o.-year-old patient lying in the bed with no acute distress.  EYES: Pupils equal, round, reactive to light and accommodation. No scleral icterus. Extraocular muscles intact.  HEENT: Head atraumatic, normocephalic. Oropharynx and nasopharynx clear.  NECK:  Supple, no jugular venous distention. No thyroid enlargement, no tenderness.  LUNGS: Normal breath sounds bilaterally, no wheezing, rales, rhonchi. No use of accessory muscles of respiration.   CARDIOVASCULAR: S1, S2 normal. No murmurs, rubs, or gallops.  ABDOMEN: Soft, nontender, nondistended. Bowel sounds present. No organomegaly or mass.  EXTREMITIES: No cyanosis, clubbing or edema b/l.    NEUROLOGIC: Cranial nerves II through XII are intact. No focal Motor or sensory deficits b/l.   PSYCHIATRIC:  patient is alert and oriented x 3.  SKIN: No obvious rash, lesion, or ulcer.   LABORATORY PANEL:  CBC  Recent Labs Lab 12/05/15 0520  WBC 6.3  HGB 8.9*  HCT 26.1*  PLT 113*    Chemistries   Recent Labs Lab 12/01/15 2042  12/03/15 0550  NA 139  < > 136  K 3.3*  < > 3.5  CL 98*  < > 102  CO2 27  < > 20*  GLUCOSE 176*  < > 160*  BUN 33*  < > 52*  CREATININE 3.01*  < > 4.61*  CALCIUM 8.7*  < > 8.2*  AST 142*  --   --   ALT 48  --   --   ALKPHOS 104  --   --   BILITOT 1.4*  --   --   < > = values in this interval not displayed. Cardiac Enzymes No results for input(s): TROPONINI in the last 168 hours. RADIOLOGY:  Dg Chest 1 View  Result Date: 12/04/2015 CLINICAL DATA:  [name] with a history of Multiple Myeloma, she was admitted to the hospital earlier this month with acute renal failure and has been on HD since that time. She has been on velcade. History is obtained via a Patent attorney, she presented to the hospital with intractable cough and chest congestion as well as persistent fever. She was admitted ton 9/27 with  possible pneumonia EXAM: CHEST 1 VIEW COMPARISON:  Chest CT, 12/02/2015.  Chest radiograph, 11/11/2015. FINDINGS: Cardiac silhouette is mildly enlarged. No mediastinal or hilar masses. No convincing adenopathy. Tunneled dual lumen central venous catheter on the left has its catheter tip is in the right atrium. Right internal jugular Port-A-Cath has its tip at the caval atrial junction. There is prominence of the bronchovascular markings with hazy ground-glass type opacity in the perihilar and lower lungs similar to the prior exam.  No new lung abnormalities. No convincing pleural effusion. No pneumothorax. Skeletal structures are unremarkable. IMPRESSION: 1. Mild hazy perihilar to lower lung zone opacity as well as prominence of the bronchovascular markings. This is similar to the prior chest radiograph. This could reflect atypical infection as suggested on the chest CT. Mild residual edema is possible. There are no new abnormalities. Electronically Signed   By: Lajean Manes M.D.   On: 12/04/2015 11:16   ASSESSMENT AND PLAN:   50 year old with multiple myeloma presenting with sepsis and pneumonia #1 Sepsis ([place]) - Due to pneumonia CT scan shows atypical pneumonia, possible fungal Appreciate infectious disease input status post bronchoscopy on 12/04/15 further therapy based on findings of her him bronc results and cultures. TB is being ruled out -cont vanc, levaquin and cefepime #2 multiple myeloma with worsening anemia status post transfusion Hemoglobin stable #3 End stage renal failure on dialysis [facility]) - nephrology following for dialysis  #4 HTN (hypertension) - stable, continue home meds #5 Depression - continue home meds #6  multiple myeloma prognosis poor oncology input appreciated  Case discussed with Care Management/Social Worker. Management plans discussed with the patient, family and they are in agreement.  CODE STATUS: full  DVT Prophylaxis: heparin  TOTAL TIME TAKING CARE OF THIS PATIENT: 30 minutes.  >50% time spent on counselling and coordination of care  POSSIBLE D/C IN 1-2 DAYS, DEPENDING ON CLINICAL CONDITION.  Note: This dictation was prepared with Dragon dictation along with smaller phrase technology. Any transcriptional errors that result from this process are unintentional.  Venice Liz M.D on 12/05/2015 at 3:24 PM  Between 7am to 6pm - Pager - [phone]  After 6pm go to [email or web address] - password [email or web address] [facility]  Office  [phone]  CC: Primary care  physician; [name], MD

## 2015-12-05 NOTE — Progress Notes (Signed)
[place] INFECTIOUS DISEASE PROGRESS NOTE Date of Admission:  12/01/2015     ID: Karl Bales Ranae Palms is a 50 y.o. female with MM on chemo and cough, fever Principal Problem:   Sepsis ([place]) Active Problems:   Multiple myeloma ([place])   HCAP (healthcare-associated pneumonia)   End stage renal failure on dialysis ([place])   HTN (hypertension)   Depression   Subjective: No fevers since low grade on 9/27. Remains off O2 Still with cough, non productive. Getting HD Seen with translator  ROS  Eleven systems are reviewed and negative except per hpi  Medications:  Antibiotics Given (last 72 hours)    Date/Time Action Medication Dose Rate   12/02/15 2303 Given   ceFEPIme (MAXIPIME) 1 g in dextrose 5 % 50 mL IVPB 1 g 100 mL/hr   12/03/15 1159 Given   vancomycin (VANCOCIN) 500 mg in sodium chloride 0.9 % 100 mL IVPB 500 mg 100 mL/hr   12/03/15 1851 Given   levofloxacin (LEVAQUIN) IVPB 750 mg 750 mg 100 mL/hr   12/03/15 2249 Given   ceFEPIme (MAXIPIME) 1 g in dextrose 5 % 50 mL IVPB 1 g 100 mL/hr   12/04/15 2253 Given   ceFEPIme (MAXIPIME) 1 g in dextrose 5 % 50 mL IVPB 1 g 100 mL/hr     . butamben-tetracaine-benzocaine  1 spray Topical Once  . ceFEPime (MAXIPIME) IV  2 g Intravenous Q M,W,F-HD  . chlorpheniramine-HYDROcodone  5 mL Oral Q12H  . epoetin (EPOGEN/PROCRIT) injection  10,000 Units Intravenous Q M,W,F-HD  . feeding supplement (ENSURE ENLIVE)  237 mL Oral BID BM  . ferrous sulfate  325 mg Oral Q breakfast  . furosemide  80 mg Oral Daily  . gabapentin  300 mg Oral TID  . heparin  5,000 Units Subcutaneous Q8H  . insulin aspart  0-5 Units Subcutaneous QHS  . insulin aspart  0-9 Units Subcutaneous TID WC  . levofloxacin (LEVAQUIN) IV  500 mg Intravenous Q48H  . lidocaine  1 application Topical Once  . vancomycin  500 mg Intravenous Q M,W,F-HD  . verapamil  40 mg Oral BID    Objective: Vital signs in last 24 hours: Temp:  [98.2 F (36.8 C)-98.9 F (37.2 C)] 98.2  F (36.8 C) (09/29 1500) Pulse Rate:  [87-111] 91 (09/29 1530) Resp:  [17-18] 17 (09/29 1530) BP: (103-110)/(38-76) 105/38 (09/29 1530) SpO2:  [95 %-97 %] 97 % (09/29 1500) Weight:  [49.6 kg (109 lb 4.8 oz)] 49.6 kg (109 lb 4.8 oz) (09/29 1500) Constitutional:  oriented to person, place, and time. Thin, in mild resp distress, has repeated dry cough HENT: [place]/AT, PERRLA, no scleral icterus Mouth/Throat: Oropharynx is clear and moist. No oropharyngeal exudate.  Cardiovascular: Normal rate, regular rhythm and normal heart sounds.  Pulmonary/Chest: Poor air movement throughout. Rhonchi throughout Neck supple, no nuchal rigidity Abdominal: Soft. Bowel sounds are normal.  exhibits no distension. There is no tenderness.  Lymphadenopathy: no cervical adenopathy. No axillary adenopathy Neurological: alert and oriented to person, place, and time.  Skin: Skin is warm and dry. No rash noted. No erythema.  Psychiatric: a normal mood and affect.  behavior is normal.   Lab Results  Recent Labs  12/03/15 0550 12/05/15 0520  WBC 4.0 6.3  HGB 8.7* 8.9*  HCT 25.5* 26.1*  NA 136  --   K 3.5  --   CL 102  --   CO2 20*  --   BUN 52*  --   CREATININE 4.61*  --  Microbiology: Results for orders placed or performed during the hospital encounter of 12/01/15  Blood Culture (routine x 2)     Status: None (Preliminary result)   Collection Time: 12/01/15  8:42 PM  Result Value Ref Range Status   Specimen Description BLOOD LEFT HAND  Final   Special Requests BOTTLES DRAWN AEROBIC AND ANAEROBIC 5ML  Final   Culture NO GROWTH 4 DAYS  Final   Report Status PENDING  Incomplete  Blood Culture (routine x 2)     Status: None (Preliminary result)   Collection Time: 12/01/15  8:42 PM  Result Value Ref Range Status   Specimen Description BLOOD PORTA CATH  Final   Special Requests BOTTLES DRAWN AEROBIC AND ANAEROBIC 7ML  Final   Culture NO GROWTH 4 DAYS  Final   Report Status PENDING  Incomplete  Urine  culture     Status: Abnormal   Collection Time: 12/01/15  8:42 PM  Result Value Ref Range Status   Specimen Description URINE, RANDOM  Final   Special Requests NONE  Final   Culture (A)  Final    <10,000 COLONIES/mL INSIGNIFICANT GROWTH Performed at [facility]    Report Status 12/03/2015 FINAL  Final  Culture, fungus without smear ([facility]-Only)     Status: None (Preliminary result)   Collection Time: 12/04/15 11:55 AM  Result Value Ref Range Status   Specimen Description BRONCHIAL ALVEOLAR LAVAGE  Final   Special Requests Immunocompromised  Final   Culture   Final    NO FUNGUS ISOLATED AFTER 1 DAY Performed at [facility]    Report Status PENDING  Incomplete  Culture, bal-quantitative     Status: None (Preliminary result)   Collection Time: 12/04/15 11:55 AM  Result Value Ref Range Status   Specimen Description BRONCHIAL ALVEOLAR LAVAGE  Final   Special Requests Immunocompromised  Final   Gram Stain   Final    FEW WBC PRESENT,BOTH PMN AND MONONUCLEAR NO ORGANISMS SEEN    Culture   Final    NO GROWTH < 24 HOURS Performed at [facility]    Report Status PENDING  Incomplete    Studies/Results: Dg Chest 1 View  Result Date: 12/04/2015 CLINICAL DATA:  [name] with a history of Multiple Myeloma, she was admitted to the hospital earlier this month with acute renal failure and has been on HD since that time. She has been on velcade. History is obtained via a Patent attorney, she presented to the hospital with intractable cough and chest congestion as well as persistent fever. She was admitted ton 9/27 with possible pneumonia EXAM: CHEST 1 VIEW COMPARISON:  Chest CT, 12/02/2015.  Chest radiograph, 11/11/2015. FINDINGS: Cardiac silhouette is mildly enlarged. No mediastinal or hilar masses. No convincing adenopathy. Tunneled dual lumen central venous catheter on the left has its catheter tip is in the right atrium. Right internal jugular  Port-A-Cath has its tip at the caval atrial junction. There is prominence of the bronchovascular markings with hazy ground-glass type opacity in the perihilar and lower lungs similar to the prior exam. No new lung abnormalities. No convincing pleural effusion. No pneumothorax. Skeletal structures are unremarkable. IMPRESSION: 1. Mild hazy perihilar to lower lung zone opacity as well as prominence of the bronchovascular markings. This is similar to the prior chest radiograph. This could reflect atypical infection as suggested on the chest CT. Mild residual edema is possible. There are no new abnormalities. Electronically Signed   By: Shanon Brow  Ormond M.D.   On: 12/04/2015 11:16    Assessment/Plan: Jamie Keller is a 50 y.o. female immunocompromised patient with MM on Velcade admitted with a week of cough and fevers. She has diffuse nodular findings and Ground glass opacities on CT and a dry cough. Maintaining sats but appears dyspneic.  I am very concerned for a progressive atypical infection given immunocompromise. Differential is broad including atypical bacterial pathogens, fungal (Histo, crypto, blasto, aspergillus), PCP, viral or mycobacterial.  Pulmonary toxicity from chemo agent is also possible given velcade use.   While she has a neg PPD, she is from an endemic area and was noted to have a calcified granuloma on prior imaging so TB remains in the differential although this is a relatively acute presentation. Bronch done 9/28 showed mucus throughout Results negative Histo U ag, HIV  Legionella U ag Mycoplasma serology  Pending AFB cx, Chlamydia serology Fungal cx- ngtd Cytology  Recommendations Would keep her in hospital over weekend pending results and monitor her for worsening Cont  levo for atypical coverage If worsens add voriconazole Can dc vanco and cefepime Sunday if routine BAL cx is negative Cultures pending. Added on PCP stain and viral culture to BAL I will see again  Monday. Thank you very much for the consult. Will follow with you.  Winta Barcelo P   12/05/2015, 3:43 PM

## 2015-12-05 NOTE — Progress Notes (Signed)
Initial Nutrition Assessment     INTERVENTION:  -Recommend liberalizing diet to regular at this time with decreased intake and wt loss. -Recommend Ensure Enlive po BID, each supplement provides 350 kcal and 20 grams of protein. May need to consider nepro BID pending intake and electrolytes     NUTRITION DIAGNOSIS:   Inadequate oral intake related to acute illness as evidenced by per patient/family report.    GOAL:   Patient will meet greater than or equal to 90% of their needs    MONITOR:   PO intake, Supplement acceptance  REASON FOR ASSESSMENT:        ASSESSMENT:      Pt admitted with pneumonia. Pt has history of multiple myeloma, anemia, ARF on HD  Past Medical History:  Diagnosis Date  . Diabetes mellitus without complication ([place])   . Hypertension   . Multiple myeloma ([place])   . Osteoarthritis   . Post-menopausal 2014   Pt with recent admission. Unable to speak with pt this pm during rounds.   On last admission 9/12 assessment pt with poor po intake for the past week prior to admission.  Noted ate 100% breakfast on 9/27, 0% at lunch on 9/26, 25% at breakfast  Medications reviewed:  Labs reviewed: BUN 52, creatinine 4.61, glucose 160  Diet Order:  Diet heart healthy/carb modified Room service appropriate? Yes; Fluid consistency: Thin  Skin:  Reviewed, no issues  Last BM:  9/28  Height:   Ht Readings from Last 1 Encounters:  12/01/15 5' 2"  (1.575 m)    Weight: noted 12% wt loss in the last 8 months   Wt Readings from Last 1 Encounters:  12/03/15 110 lb 8 oz (50.1 kg)    Ideal Body Weight:     BMI:  Body mass index is 20.21 kg/m.  Estimated Nutritional Needs:   Kcal:  2780-0447 kcals/d  Protein:  60-75 g/d  Fluid:  1050m + UOP  EDUCATION NEEDS:   No education needs identified at this time  Lillyan Hitson B. AZenia Resides RBeardsley LOlney(pager) Weekend/On-Call pager ([phone]

## 2015-12-05 NOTE — Consult Note (Signed)
[place] [facility] Consultation      Assessment and Plan:  A: Pneumonia/lung nodules with bronchiectatic changes in a 50 yo female with multiple myeloma with recent use of immunosuppression (Velcade). Now with intractable cough, fevers of uncertain etiology.   P: Continue abx and serological workup per ID recs.  --s/p bronchoscopy, now appears recovered.  --Symptomatic treatment of cough.   Pulmonary service will sign off for now, please call if there are any questions or concerns.   Date: 12/05/2015    Subjective:  New new complaints today, continue to have cough, which has not really improved.    Medication:    Reviewed   Allergies:  No known allergies  Review of Systems: Gen:  Denies  fever, sweats, chills HEENT: Denies blurred vision Cvc:  No dizziness, chest pain. Resp:   Denies sputum production, shortness of breath Gi: Denies swallowing difficulty, Psych: No depression, insomnia. Other:  All other systems were reviewed with the patient and were negative other that what is mentioned in the HPI.   Physical Examination:   VS: BP 110/76 (BP Location: Right Arm)   Pulse (!) 111   Temp 98.9 F (37.2 C) (Oral)   Resp 17   Ht 5' 2"  (1.575 m)   Wt 110 lb 8 oz (50.1 kg)   LMP 11/20/1985 (Approximate) Comment: Tubal ligation 1987  SpO2 95%   BMI 20.21 kg/m   General Appearance: No distress, appears comfortable on RA.  Neuro:without focal findings,  speech normal,  HEENT: PERRLA, EOM intact.   Pulmonary: Scattered rhonchi bilaterally.   CardiovascularNormal S1,S2.  No m/r/g.   Abdomen: Benign, Soft, non-tender. Renal:  No costovertebral tenderness  GU:  No performed at this time. Endoc: No evident thyromegaly, no signs of acromegaly. Skin:   warm, no rashes, no ecchymosis  Extremities: normal, no cyanosis, clubbing.  Other findings:    LABORATORY PANEL:   CBC  Recent Labs Lab 12/05/15 0520  WBC 6.3  HGB 8.9*  HCT 26.1*  PLT 113*    ------------------------------------------------------------------------------------------------------------------  Chemistries   Recent Labs Lab 12/01/15 2042  12/03/15 0550  NA 139  < > 136  K 3.3*  < > 3.5  CL 98*  < > 102  CO2 27  < > 20*  GLUCOSE 176*  < > 160*  BUN 33*  < > 52*  CREATININE 3.01*  < > 4.61*  CALCIUM 8.7*  < > 8.2*  AST 142*  --   --   ALT 48  --   --   ALKPHOS 104  --   --   BILITOT 1.4*  --   --   < > = values in this interval not displayed. ------------------------------------------------------------------------------------------------------------------  Cardiac Enzymes No results for input(s): TROPONINI in the last 168 hours. ------------------------------------------------------------  RADIOLOGY:  Dg Chest 1 View  Result Date: 12/04/2015 CLINICAL DATA:  [name] with a history of Multiple Myeloma, she was admitted to the hospital earlier this month with acute renal failure and has been on HD since that time. She has been on velcade. History is obtained via a Patent attorney, she presented to the hospital with intractable cough and chest congestion as well as persistent fever. She was admitted ton 9/27 with possible pneumonia EXAM: CHEST 1 VIEW COMPARISON:  Chest CT, 12/02/2015.  Chest radiograph, 11/11/2015. FINDINGS: Cardiac silhouette is mildly enlarged. No mediastinal or hilar masses. No convincing adenopathy. Tunneled dual lumen central venous catheter on the left has  its catheter tip is in the right atrium. Right internal jugular Port-A-Cath has its tip at the caval atrial junction. There is prominence of the bronchovascular markings with hazy ground-glass type opacity in the perihilar and lower lungs similar to the prior exam. No new lung abnormalities. No convincing pleural effusion. No pneumothorax. Skeletal structures are unremarkable. IMPRESSION: 1. Mild hazy perihilar to lower lung zone opacity as well as prominence of the  bronchovascular markings. This is similar to the prior chest radiograph. This could reflect atypical infection as suggested on the chest CT. Mild residual edema is possible. There are no new abnormalities. Electronically Signed   By: Lajean Manes M.D.   On: 12/04/2015 11:16       Thank  you for the consultation and for allowing [facility] to assist in the care of your patient. Our recommendations are noted above.  Please contact us if we can be of further service.   Marda Stalker, MD.  Board Certified in Internal Medicine, Pulmonary Medicine, [place], and Sleep Medicine.  [facility] Office Number: [phone]  Patricia Pesa, M.D.  Vilinda Boehringer, M.D.  Merton Border, M.D  12/05/2015

## 2015-12-05 NOTE — Progress Notes (Signed)
PRE DIALYSIS ASSESSMENT 

## 2015-12-05 NOTE — Progress Notes (Signed)
HD COMPLETED  

## 2015-12-05 NOTE — Progress Notes (Signed)
[facility]  ROUNDING NOTE   Subjective:  Patient appears to be better today. She reports that her cough is less prominent. She is due for hemodialysis today at the bedside as she is on respiratory precautions. Case discussed with pulmonology as well.  Objective:  Vital signs in last 24 hours:  Temp:  [98.6 F (37 C)-98.9 F (37.2 C)] 98.9 F (37.2 C) (09/28 2150) Pulse Rate:  [111-112] 111 (09/28 2150) Resp:  [17] 17 (09/28 2150) BP: (110-127)/(57-76) 110/76 (09/28 2150) SpO2:  [95 %-97 %] 95 % (09/28 2150)  Weight change:  Filed Weights   12/03/15 0921 12/03/15 1303 12/03/15 1316  Weight: 51.3 kg (113 lb 1.5 oz) 49.4 kg (108 lb 14.5 oz) 50.1 kg (110 lb 8 oz)    Intake/Output: I/O last 3 completed shifts: In: 304 [P.O.:200; IV Piggyback:104] Out: 1225 [Urine:1225]   Intake/Output this shift:  No intake/output data recorded.  Physical Exam: General: No acute distress  Head: Normocephalic, atraumatic. Moist oral mucosal membranes  Eyes: Anicteric  Neck: Supple, trachea midline  Lungs:  Bilateral rales, normal effort  Heart: S1S2 no rubs  Abdomen:  Soft, non tender, BS present  Extremities: Trace peripheral edema.  Neurologic: Nonfocal, moving all four extremities  Skin: No lesions  Access: [facility] permcath 9/13 Dr. Lucky Cowboy, rt IJ port    Basic Metabolic Panel:  Recent Labs Lab 12/01/15 2042 12/02/15 0416 12/03/15 0550 12/05/15 0521  NA 139 139 136  --   K 3.3* 3.3* 3.5  --   CL 98* 102 102  --   CO2 27 27 20*  --   GLUCOSE 176* 146* 160*  --   BUN 33* 40* 52*  --   CREATININE 3.01* 3.68* 4.61*  --   CALCIUM 8.7* 7.8* 8.2*  --   PHOS  --   --  4.9* 4.4    Liver Function Tests:  Recent Labs Lab 12/01/15 2042  AST 142*  ALT 48  ALKPHOS 104  BILITOT 1.4*  PROT 7.6  ALBUMIN 3.2*   No results for input(s): LIPASE, AMYLASE in the last 168 hours. No results for input(s): AMMONIA in the last 168 hours.  CBC:  Recent Labs Lab  12/01/15 2042 12/02/15 0416 12/02/15 1909 12/03/15 0550 12/05/15 0520  WBC 5.5 3.6  --  4.0 6.3  NEUTROABS 4.5  --   --   --   --   HGB 7.9* 6.5* 8.9* 8.7* 8.9*  HCT 22.9* 19.3* 26.3* 25.5* 26.1*  MCV 88.2 89.6  --  87.7 88.4  PLT 126* 98*  --  106* 113*    Cardiac Enzymes: No results for input(s): CKTOTAL, CKMB, CKMBINDEX, TROPONINI in the last 168 hours.  BNP: Invalid input(s): POCBNP  CBG:  Recent Labs Lab 12/04/15 1147 12/04/15 1648 12/04/15 2159 12/05/15 0734 12/05/15 1126  GLUCAP 161* 199* 239* 167* 167*    Microbiology: Results for orders placed or performed during the hospital encounter of 12/01/15  Blood Culture (routine x 2)     Status: None (Preliminary result)   Collection Time: 12/01/15  8:42 PM  Result Value Ref Range Status   Specimen Description BLOOD LEFT HAND  Final   Special Requests BOTTLES DRAWN AEROBIC AND ANAEROBIC 5ML  Final   Culture NO GROWTH 4 DAYS  Final   Report Status PENDING  Incomplete  Blood Culture (routine x 2)     Status: None (Preliminary result)   Collection Time: 12/01/15  8:42 PM  Result Value Ref Range Status  Specimen Description BLOOD PORTA CATH  Final   Special Requests BOTTLES DRAWN AEROBIC AND ANAEROBIC 7ML  Final   Culture NO GROWTH 4 DAYS  Final   Report Status PENDING  Incomplete  Urine culture     Status: Abnormal   Collection Time: 12/01/15  8:42 PM  Result Value Ref Range Status   Specimen Description URINE, RANDOM  Final   Special Requests NONE  Final   Culture (A)  Final    <10,000 COLONIES/mL INSIGNIFICANT GROWTH Performed at [facility]    Report Status 12/03/2015 FINAL  Final  Culture, fungus without smear ([facility]-Only)     Status: None (Preliminary result)   Collection Time: 12/04/15 11:55 AM  Result Value Ref Range Status   Specimen Description BRONCHIAL ALVEOLAR LAVAGE  Final   Special Requests Immunocompromised  Final   Culture   Final    NO FUNGUS ISOLATED AFTER 1 DAY Performed at  [facility]    Report Status PENDING  Incomplete  Culture, bal-quantitative     Status: None (Preliminary result)   Collection Time: 12/04/15 11:55 AM  Result Value Ref Range Status   Specimen Description BRONCHIAL ALVEOLAR LAVAGE  Final   Special Requests Immunocompromised  Final   Gram Stain   Final    FEW WBC PRESENT,BOTH PMN AND MONONUCLEAR NO ORGANISMS SEEN Performed at [facility]    Culture PENDING  Incomplete   Report Status PENDING  Incomplete    Coagulation Studies: No results for input(s): LABPROT, INR in the last 72 hours.  Urinalysis: No results for input(s): COLORURINE, LABSPEC, PHURINE, GLUCOSEU, HGBUR, BILIRUBINUR, KETONESUR, PROTEINUR, UROBILINOGEN, NITRITE, LEUKOCYTESUR in the last 72 hours.  Invalid input(s): APPERANCEUR    Imaging: Dg Chest 1 View  Result Date: 12/04/2015 CLINICAL DATA:  [name] with a history of Multiple Myeloma, she was admitted to the hospital earlier this month with acute renal failure and has been on HD since that time. She has been on velcade. History is obtained via a Patent attorney, she presented to the hospital with intractable cough and chest congestion as well as persistent fever. She was admitted ton 9/27 with possible pneumonia EXAM: CHEST 1 VIEW COMPARISON:  Chest CT, 12/02/2015.  Chest radiograph, 11/11/2015. FINDINGS: Cardiac silhouette is mildly enlarged. No mediastinal or hilar masses. No convincing adenopathy. Tunneled dual lumen central venous catheter on the left has its catheter tip is in the right atrium. Right internal jugular Port-A-Cath has its tip at the caval atrial junction. There is prominence of the bronchovascular markings with hazy ground-glass type opacity in the perihilar and lower lungs similar to the prior exam. No new lung abnormalities. No convincing pleural effusion. No pneumothorax. Skeletal structures are unremarkable. IMPRESSION: 1. Mild hazy perihilar to lower lung  zone opacity as well as prominence of the bronchovascular markings. This is similar to the prior chest radiograph. This could reflect atypical infection as suggested on the chest CT. Mild residual edema is possible. There are no new abnormalities. Electronically Signed   By: Lajean Manes M.D.   On: 12/04/2015 11:16     Medications:     . butamben-tetracaine-benzocaine  1 spray Topical Once  . ceFEPime (MAXIPIME) IV  2 g Intravenous Q M,W,F-HD  . epoetin (EPOGEN/PROCRIT) injection  10,000 Units Intravenous Q M,W,F-HD  . ferrous sulfate  325 mg Oral Q breakfast  . furosemide  80 mg Oral Daily  . gabapentin  300 mg Oral TID  . heparin  5,000 Units Subcutaneous Q8H  . insulin aspart  0-5 Units Subcutaneous QHS  . insulin aspart  0-9 Units Subcutaneous TID WC  . levofloxacin (LEVAQUIN) IV  500 mg Intravenous Q48H  . lidocaine  1 application Topical Once  . vancomycin  500 mg Intravenous Q M,W,F-HD  . verapamil  40 mg Oral BID   acetaminophen **OR** acetaminophen, benzonatate, guaiFENesin-codeine, ipratropium-albuterol, ipratropium-albuterol, midazolam, ondansetron **OR** ondansetron (ZOFRAN) IV, oxyCODONE, phenylephrine  Assessment/ Plan:  50 y.o.hispanic female with diabetes mellitus type 2, hypertension, multiple myeloma on chemotherapy, osteoarthritis, chronic kidney disease stage III   1. Acute renal failure on CKD stage III baseline Cr 1.4 from 8/28/217.  Concern for progression of myeloma kidney. Nonoliguric urine output. - 24 hour for creatinine clearance 7.   - patient due for hemodialysis today.  Orders have been prepared.  Continue dialysis on a Monday, Wednesday, Friday schedule.  At this point in time she remains dialysis dependent. If her renal failure persists in the next week she will likely have progressed to end-stage renal disease.   2. 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 With thrombocytopenia.  - Oncology: Dr. Grayland Ormond -  Hemoglobin up slightly to 8.9.   Continue Epogen with dialysis.  3. Multiple Myeloma:  Patient appears to have had progression of her underlying multiple myeloma.  She is being considered for further therapy after acute issues including infection resolved.  4.  Pneumonia, bacterial vs fungal.  J18.9.  Patient found to have atypical pattern of pneumonitis on chest CT.  She has underlying multiple myeloma and can be considered as being immunosuppressed.  Status post bronchoscopy 12/04/15 - patient status post bronchoscopy today.  Continue broad-spectrum antibiotics including cefepime, Levaquin,and vancomycin.  Clinically she appears to be improving at the moment.     LOS: 4 Jamie Keller 9/29/201712:21 PM

## 2015-12-05 NOTE — Progress Notes (Signed)
POST DIALYSIS ASSESSMENT 

## 2015-12-05 NOTE — Progress Notes (Signed)
HD STARTED  

## 2015-12-05 NOTE — Progress Notes (Signed)
Pharmacy Antibiotic Note  Jamie Keller is a 50 y.o. female admitted on 12/01/2015 with pneumonia.  Pharmacy has been consulted for cefepime and vancomycin dosing. Pt is on intermittent HD. Appears schedule is MWF  Plan: Continue vancomycin 500mg  q/ HD. Will change cefepime to 2g q/ HD. Continue levofloxacin 500mg  q 48 hours. Vanc trough prior to HD on mondauy 10/2 Height: 5\' 2"  (157.5 cm) Weight: 110 lb 8 oz (50.1 kg) IBW/kg (Calculated) : 50.1  Temp (24hrs), Avg:98.5 F (36.9 C), Min:98.1 F (36.7 C), Max:98.9 F (37.2 C)   Recent Labs Lab 12/01/15 2042 12/02/15 0416 12/03/15 0550 12/05/15 0520  WBC 5.5 3.6 4.0 6.3  CREATININE 3.01* 3.68* 4.61*  --   LATICACIDVEN 1.8  --   --   --     Estimated Creatinine Clearance: 11.4 mL/min (by C-G formula based on SCr of 4.61 mg/dL (H)).    Allergies  Allergen Reactions  . No Known Allergies     ABX this admission vancomycin 9/25 >>  cefepime 9/25 >>  Levaquin 9/27>>  Microbiology results:  9/25 BCx: NG 9/25 UCx: insig growth  BAL- no organisim seen Cx fungal: none seen Histoplasma antigen =neg Mycoplasma pna=neg HIV =neg   Thank you for allowing pharmacy to be a part of this patient's care.   Ramond Dial, Pharm.D. Clinical Pharmacist 12/05/2015 11:18 AM

## 2015-12-06 ENCOUNTER — Inpatient Hospital Stay: Payer: Self-pay

## 2015-12-06 LAB — CULTURE, BAL-QUANTITATIVE W GRAM STAIN

## 2015-12-06 LAB — GLUCOSE, CAPILLARY
GLUCOSE-CAPILLARY: 161 mg/dL — AB (ref 65–99)
GLUCOSE-CAPILLARY: 192 mg/dL — AB (ref 65–99)
GLUCOSE-CAPILLARY: 96 mg/dL (ref 65–99)
Glucose-Capillary: 143 mg/dL — ABNORMAL HIGH (ref 65–99)

## 2015-12-06 LAB — CULTURE, BLOOD (ROUTINE X 2)
CULTURE: NO GROWTH
CULTURE: NO GROWTH

## 2015-12-06 LAB — CULTURE, BAL-QUANTITATIVE: CULTURE: NO GROWTH

## 2015-12-06 NOTE — Progress Notes (Signed)
[facility]  ROUNDING NOTE   Subjective:  Patient's cough continues to improve. [name] been taken off of vancomycin and cefepime. She is due for dialysis again on Monday.  Objective:  Vital signs in last 24 hours:  Temp:  [97.7 F (36.5 C)-98 F (36.7 C)] 97.7 F (36.5 C) (09/30 1352) Pulse Rate:  [88-101] 88 (09/30 1352) Resp:  [17-24] 20 (09/30 1352) BP: (95-109)/(34-56) 108/56 (09/30 1352) SpO2:  [96 %-98 %] 98 % (09/30 1352) Weight:  [46.9 kg (103 lb 8 oz)] 46.9 kg (103 lb 8 oz) (09/29 1839)  Weight change:  Filed Weights   12/03/15 1316 12/05/15 1500 12/05/15 1839  Weight: 50.1 kg (110 lb 8 oz) 49.6 kg (109 lb 4.8 oz) 46.9 kg (103 lb 8 oz)    Intake/Output: I/O last 3 completed shifts: In: 190 [P.O.:140; IV Piggyback:50] Out: 2425 [Urine:925; Other:1500]   Intake/Output this shift:  No intake/output data recorded.  Physical Exam: General: No acute distress  Head: Normocephalic, atraumatic. Moist oral mucosal membranes  Eyes: Anicteric  Neck: Supple, trachea midline  Lungs:  Diminished bilateral rales, normal effort  Heart: S1S2 no rubs  Abdomen:  Soft, non tender, BS present  Extremities: no peripheral edema.  Neurologic: Nonfocal, moving all four extremities  Skin: No lesions  Access: LIJ permcath 9/13 Dr. Lucky Cowboy, rt IJ port    Basic Metabolic Panel:  Recent Labs Lab 12/01/15 2042 12/02/15 0416 12/03/15 0550 12/05/15 0521  NA 139 139 136  --   K 3.3* 3.3* 3.5  --   CL 98* 102 102  --   CO2 27 27 20*  --   GLUCOSE 176* 146* 160*  --   BUN 33* 40* 52*  --   CREATININE 3.01* 3.68* 4.61*  --   CALCIUM 8.7* 7.8* 8.2*  --   PHOS  --   --  4.9* 4.4    Liver Function Tests:  Recent Labs Lab 12/01/15 2042  AST 142*  ALT 48  ALKPHOS 104  BILITOT 1.4*  PROT 7.6  ALBUMIN 3.2*   No results for input(s): LIPASE, AMYLASE in the last 168 hours. No results for input(s): AMMONIA in the last 168 hours.  CBC:  Recent Labs Lab  12/01/15 2042 12/02/15 0416 12/02/15 1909 12/03/15 0550 12/05/15 0520  WBC 5.5 3.6  --  4.0 6.3  NEUTROABS 4.5  --   --   --   --   HGB 7.9* 6.5* 8.9* 8.7* 8.9*  HCT 22.9* 19.3* 26.3* 25.5* 26.1*  MCV 88.2 89.6  --  87.7 88.4  PLT 126* 98*  --  106* 113*    Cardiac Enzymes: No results for input(s): CKTOTAL, CKMB, CKMBINDEX, TROPONINI in the last 168 hours.  BNP: Invalid input(s): POCBNP  CBG:  Recent Labs Lab 12/05/15 0734 12/05/15 1126 12/05/15 2047 12/06/15 0720 12/06/15 1115  GLUCAP 167* 167* 237* 143* 161*    Microbiology: Results for orders placed or performed during the hospital encounter of 12/01/15  Blood Culture (routine x 2)     Status: None   Collection Time: 12/01/15  8:42 PM  Result Value Ref Range Status   Specimen Description BLOOD LEFT HAND  Final   Special Requests BOTTLES DRAWN AEROBIC AND ANAEROBIC 5ML  Final   Culture NO GROWTH 5 DAYS  Final   Report Status 12/06/2015 FINAL  Final  Blood Culture (routine x 2)     Status: None   Collection Time: 12/01/15  8:42 PM  Result Value Ref Range  Status   Specimen Description BLOOD PORTA CATH  Final   Special Requests BOTTLES DRAWN AEROBIC AND ANAEROBIC 7ML  Final   Culture NO GROWTH 5 DAYS  Final   Report Status 12/06/2015 FINAL  Final  Urine culture     Status: Abnormal   Collection Time: 12/01/15  8:42 PM  Result Value Ref Range Status   Specimen Description URINE, RANDOM  Final   Special Requests NONE  Final   Culture (A)  Final    <10,000 COLONIES/mL INSIGNIFICANT GROWTH Performed at [facility]    Report Status 12/03/2015 FINAL  Final  Acid Fast Smear (AFB)     Status: None   Collection Time: 12/04/15 11:55 AM  Result Value Ref Range Status   AFB Specimen Processing Concentration  Final   Acid Fast Smear Negative  Final    Comment: (NOTE) Performed At: [facility] [address] [place], [place] [id number] Lindon Romp MD BT:[id number]    Source (AFB) BRONCHIAL  ALVEOLAR LAVAGE  Final  Culture, fungus without smear ([facility]-Only)     Status: None (Preliminary result)   Collection Time: 12/04/15 11:55 AM  Result Value Ref Range Status   Specimen Description BRONCHIAL ALVEOLAR LAVAGE  Final   Special Requests Immunocompromised  Final   Culture   Final    NO FUNGUS ISOLATED AFTER 2 DAYS Performed at [facility]    Report Status PENDING  Incomplete  Culture, bal-quantitative     Status: None   Collection Time: 12/04/15 11:55 AM  Result Value Ref Range Status   Specimen Description BRONCHIAL ALVEOLAR LAVAGE  Final   Special Requests Immunocompromised  Final   Gram Stain   Final    FEW WBC PRESENT,BOTH PMN AND MONONUCLEAR NO ORGANISMS SEEN    Culture   Final    NO GROWTH 2 DAYS Performed at [facility]    Report Status 12/06/2015 FINAL  Final    Coagulation Studies: No results for input(s): LABPROT, INR in the last 72 hours.  Urinalysis: No results for input(s): COLORURINE, LABSPEC, PHURINE, GLUCOSEU, HGBUR, BILIRUBINUR, KETONESUR, PROTEINUR, UROBILINOGEN, NITRITE, LEUKOCYTESUR in the last 72 hours.  Invalid input(s): APPERANCEUR    Imaging: US Transvaginal Non-ob  Result Date: 12/06/2015 CLINICAL DATA:  Postmenopausal bleeding. EXAM: TRANSABDOMINAL AND TRANSVAGINAL ULTRASOUND OF PELVIS TECHNIQUE: Both transabdominal and transvaginal ultrasound examinations of the pelvis were performed. Transabdominal technique was performed for global imaging of the pelvis including uterus, ovaries, adnexal regions, and pelvic cul-de-sac. It was necessary to proceed with endovaginal exam following the transabdominal exam to visualize the endometrium and ovaries. COMPARISON:  None FINDINGS: Uterus Measurements: 7.1 x 3.1 x 3.8 cm. There is a 6 x 3 x 7 mm fibroid in the mid uterus. Endometrium Thickness: 5.6 mm on endovaginal imaging. No focal abnormality visualized. Right ovary Not visualized. Left ovary Not visualized. Other findings There is a  13 mm nabothian cyst.  No free fluid in the pelvis. IMPRESSION: 1. No focal endometrial mass is identified. However, the endometrial stripe thickness was measured at 5.6 mm. Normal is less than 5 mm. Recommend gynecologic consultation and tissue sampling given the history of postmenopausal bleeding and an endometrial stripe thickness of greater than 5 mm. These results will be called to the ordering clinician or representative by the Radiologist Assistant, and communication documented in the PACS or zVision Dashboard. Electronically Signed   By: Dorise Bullion III M.D   On: 12/06/2015 14:05   US Pelvis Complete  Result Date:  12/06/2015 CLINICAL DATA:  Postmenopausal bleeding. EXAM: TRANSABDOMINAL AND TRANSVAGINAL ULTRASOUND OF PELVIS TECHNIQUE: Both transabdominal and transvaginal ultrasound examinations of the pelvis were performed. Transabdominal technique was performed for global imaging of the pelvis including uterus, ovaries, adnexal regions, and pelvic cul-de-sac. It was necessary to proceed with endovaginal exam following the transabdominal exam to visualize the endometrium and ovaries. COMPARISON:  None FINDINGS: Uterus Measurements: 7.1 x 3.1 x 3.8 cm. There is a 6 x 3 x 7 mm fibroid in the mid uterus. Endometrium Thickness: 5.6 mm on endovaginal imaging. No focal abnormality visualized. Right ovary Not visualized. Left ovary Not visualized. Other findings There is a 13 mm nabothian cyst.  No free fluid in the pelvis. IMPRESSION: 1. No focal endometrial mass is identified. However, the endometrial stripe thickness was measured at 5.6 mm. Normal is less than 5 mm. Recommend gynecologic consultation and tissue sampling given the history of postmenopausal bleeding and an endometrial stripe thickness of greater than 5 mm. These results will be called to the ordering clinician or representative by the Radiologist Assistant, and communication documented in the PACS or zVision Dashboard. Electronically Signed    By: Dorise Bullion III M.D   On: 12/06/2015 14:05     Medications:     . butamben-tetracaine-benzocaine  1 spray Topical Once  . chlorpheniramine-HYDROcodone  5 mL Oral Q12H  . epoetin (EPOGEN/PROCRIT) injection  10,000 Units Intravenous Q M,W,F-HD  . feeding supplement (ENSURE ENLIVE)  237 mL Oral BID BM  . ferrous sulfate  325 mg Oral Q breakfast  . furosemide  80 mg Oral Daily  . gabapentin  300 mg Oral TID  . heparin  5,000 Units Subcutaneous Q8H  . insulin aspart  0-5 Units Subcutaneous QHS  . insulin aspart  0-9 Units Subcutaneous TID WC  . levofloxacin (LEVAQUIN) IV  500 mg Intravenous Q48H  . lidocaine  1 application Topical Once  . verapamil  40 mg Oral BID   acetaminophen **OR** acetaminophen, benzonatate, guaiFENesin-codeine, ipratropium-albuterol, ipratropium-albuterol, midazolam, ondansetron **OR** ondansetron (ZOFRAN) IV, oxyCODONE, phenylephrine  Assessment/ Plan:  50 y.o.hispanic female with diabetes mellitus type 2, hypertension, multiple myeloma on chemotherapy, osteoarthritis, chronic kidney disease stage III   1. Acute renal failure on CKD stage III baseline Cr 1.4 from 8/28/217.  Concern for progression of myeloma kidney. Nonoliguric urine output. - 24 hour for creatinine clearance 7.  Suspect shes progressed to ESRD.   - Patient had hemodialysis yesterday. No acute indication for dialysis today. Next dialysis on Monday.  2. 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 With thrombocytopenia.  - Oncology: Dr. Grayland Ormond -  Hemoglobin currently 8.9. Continue Epogen with dialysis.  3. Multiple Myeloma:  Patient appears to have had progression of her underlying multiple myeloma.  She is being considered for further therapy after acute issues including infection resolved.  4.  Pneumonia, bacterial vs fungal.  J18.9.  Patient found to have atypical pattern of pneumonitis on chest CT.  She has underlying multiple myeloma and can be considered as being  immunosuppressed.  Status post bronchoscopy 12/04/15 - Patient continued on Levaquin. She was taken off of cefepime and vancomycin today.     LOS: 5 Jamie Keller 9/30/20173:16 PM

## 2015-12-06 NOTE — Progress Notes (Signed)
Pharmacy Antibiotic Note  Jamie Keller is a 50 y.o. female admitted on 12/01/2015 with pneumonia.  Pharmacy has been consulted for cefepime and vancomycin dosing. BAL results are negative. Will discontinue vancomycin and cefepime.   Plan: Continue levofloxacin 500mg  q 48 hours.   Height: 5\' 2"  (157.5 cm) Weight: 103 lb 8 oz (46.9 kg) IBW/kg (Calculated) : 50.1  Temp (24hrs), Avg:98 F (36.7 C), Min:97.9 F (36.6 C), Max:98.2 F (36.8 C)   Recent Labs Lab 12/01/15 2042 12/02/15 0416 12/03/15 0550 12/05/15 0520  WBC 5.5 3.6 4.0 6.3  CREATININE 3.01* 3.68* 4.61*  --   LATICACIDVEN 1.8  --   --   --     Estimated Creatinine Clearance: 10.7 mL/min (by C-G formula based on SCr of 4.61 mg/dL (H)).    Allergies  Allergen Reactions  . No Known Allergies     ABX this admission vancomycin 9/25 >> 9/30 cefepime 9/25 >> 9/30 Levaquin 9/27>>  Microbiology results:  9/25 BCx: NG 9/25 UCx: insig growth  BAL- negative Cx fungal: none seen Histoplasma antigen =neg Mycoplasma pna=neg HIV =neg   Thank you for allowing pharmacy to be a part of this patient's care.   Olajuwon Fosdick L, Pharm.D. Clinical Pharmacist 12/06/2015 11:15 AM

## 2015-12-06 NOTE — Progress Notes (Signed)
[place] [place] NAME: Jamie Keller    MR#:  [id number]  DATE OF BIRTH:  1965/05/03  SUBJECTIVE:   Via interpreter  Cough much better with tussionex - no fever No vaginal bleeding REVIEW OF SYSTEMS:   Review of Systems  Constitutional: Positive for malaise/fatigue. Negative for chills, fever and weight loss.  HENT: Negative for ear discharge, ear pain and nosebleeds.   Eyes: Negative for blurred vision, pain and discharge.  Respiratory: Positive for cough. Negative for sputum production, shortness of breath, wheezing and stridor.   Cardiovascular: Negative for chest pain, palpitations, orthopnea and PND.  Gastrointestinal: Negative for abdominal pain, diarrhea, nausea and vomiting.  Genitourinary: Negative for frequency and urgency.  Musculoskeletal: Negative for back pain and joint pain.  Neurological: Positive for weakness. Negative for sensory change, speech change and focal weakness.  Psychiatric/Behavioral: Negative for depression and hallucinations. The patient is not nervous/anxious.    Tolerating Diet:yes Tolerating PT: ambulatory  DRUG ALLERGIES:   Allergies  Allergen Reactions  . No Known Allergies     VITALS:  Blood pressure (!) 108/56, pulse 88, temperature 97.7 F (36.5 C), temperature source Oral, resp. rate 20, height 5' 2"  (1.575 m), weight 46.9 kg (103 lb 8 oz), last menstrual period 11/20/1985, SpO2 98 %.  PHYSICAL EXAMINATION:   Physical Exam  GENERAL:  50 y.o.-year-old patient lying in the bed with no acute distress.  EYES: Pupils equal, round, reactive to light and accommodation. No scleral icterus. Extraocular muscles intact.  HEENT: Head atraumatic, normocephalic. Oropharynx and nasopharynx clear.  NECK:  Supple, no jugular venous distention. No thyroid enlargement, no tenderness.  LUNGS: Normal breath sounds bilaterally, no wheezing, rales, rhonchi. No use of accessory muscles of  respiration.  CARDIOVASCULAR: S1, S2 normal. No murmurs, rubs, or gallops.  ABDOMEN: Soft, nontender, nondistended. Bowel sounds present. No organomegaly or mass.  EXTREMITIES: No cyanosis, clubbing or edema b/l.    NEUROLOGIC: Cranial nerves II through XII are intact. No focal Motor or sensory deficits b/l.   PSYCHIATRIC:  patient is alert and oriented x 3.  SKIN: No obvious rash, lesion, or ulcer.   LABORATORY PANEL:  CBC  Recent Labs Lab 12/05/15 0520  WBC 6.3  HGB 8.9*  HCT 26.1*  PLT 113*    Chemistries   Recent Labs Lab 12/01/15 2042  12/03/15 0550  NA 139  < > 136  K 3.3*  < > 3.5  CL 98*  < > 102  CO2 27  < > 20*  GLUCOSE 176*  < > 160*  BUN 33*  < > 52*  CREATININE 3.01*  < > 4.61*  CALCIUM 8.7*  < > 8.2*  AST 142*  --   --   ALT 48  --   --   ALKPHOS 104  --   --   BILITOT 1.4*  --   --   < > = values in this interval not displayed. Cardiac Enzymes No results for input(s): TROPONINI in the last 168 hours. RADIOLOGY:  US Transvaginal Non-ob  Result Date: 12/06/2015 CLINICAL DATA:  Postmenopausal bleeding. EXAM: TRANSABDOMINAL AND TRANSVAGINAL ULTRASOUND OF PELVIS TECHNIQUE: Both transabdominal and transvaginal ultrasound examinations of the pelvis were performed. Transabdominal technique was performed for global imaging of the pelvis including uterus, ovaries, adnexal regions, and pelvic cul-de-sac. It was necessary to proceed with endovaginal exam following the transabdominal exam to visualize the endometrium and ovaries. COMPARISON:  None FINDINGS: Uterus Measurements: 7.1  x 3.1 x 3.8 cm. There is a 6 x 3 x 7 mm fibroid in the mid uterus. Endometrium Thickness: 5.6 mm on endovaginal imaging. No focal abnormality visualized. Right ovary Not visualized. Left ovary Not visualized. Other findings There is a 13 mm nabothian cyst.  No free fluid in the pelvis. IMPRESSION: 1. No focal endometrial mass is identified. However, the endometrial stripe thickness was  measured at 5.6 mm. Normal is less than 5 mm. Recommend gynecologic consultation and tissue sampling given the history of postmenopausal bleeding and an endometrial stripe thickness of greater than 5 mm. These results will be called to the ordering clinician or representative by the Radiologist Assistant, and communication documented in the PACS or zVision Dashboard. Electronically Signed   By: Dorise Bullion III M.D   On: 12/06/2015 14:05   US Pelvis Complete  Result Date: 12/06/2015 CLINICAL DATA:  Postmenopausal bleeding. EXAM: TRANSABDOMINAL AND TRANSVAGINAL ULTRASOUND OF PELVIS TECHNIQUE: Both transabdominal and transvaginal ultrasound examinations of the pelvis were performed. Transabdominal technique was performed for global imaging of the pelvis including uterus, ovaries, adnexal regions, and pelvic cul-de-sac. It was necessary to proceed with endovaginal exam following the transabdominal exam to visualize the endometrium and ovaries. COMPARISON:  None FINDINGS: Uterus Measurements: 7.1 x 3.1 x 3.8 cm. There is a 6 x 3 x 7 mm fibroid in the mid uterus. Endometrium Thickness: 5.6 mm on endovaginal imaging. No focal abnormality visualized. Right ovary Not visualized. Left ovary Not visualized. Other findings There is a 13 mm nabothian cyst.  No free fluid in the pelvis. IMPRESSION: 1. No focal endometrial mass is identified. However, the endometrial stripe thickness was measured at 5.6 mm. Normal is less than 5 mm. Recommend gynecologic consultation and tissue sampling given the history of postmenopausal bleeding and an endometrial stripe thickness of greater than 5 mm. These results will be called to the ordering clinician or representative by the Radiologist Assistant, and communication documented in the PACS or zVision Dashboard. Electronically Signed   By: Dorise Bullion III M.D   On: 12/06/2015 14:05   ASSESSMENT AND PLAN:   50 year old with multiple myeloma presenting with sepsis and  pneumonia #1 Sepsis ([place]) - Due to pneumonia CT scan shows atypical pneumonia, possible fungal Appreciate infectious disease input status post bronchoscopy on 12/04/15 further therapy based on findings of her him bronc results and cultures. TB is being ruled out -was on IV vanc, levaquin and cefepime---since BAL is negative per ID rec d/c vanc and cefepime  #2 multiple myeloma with worsening anemia status post transfusion Hemoglobin stable  #3End stage renal failure on dialysis [facility]) - nephrology following for dialysis   #4 HTN (hypertension) - stable, continue home meds  #5 Depression - continue home meds  #6  multiple myeloma prognosis poor oncology input appreciated  Case discussed with Care Management/Social Worker. Management plans discussed with the patient, family and they are in agreement.  CODE STATUS: full  DVT Prophylaxis: heparin  TOTAL TIME TAKING CARE OF THIS PATIENT: 30 minutes.  >50% time spent on counselling and coordination of care  POSSIBLE D/C IN 1 DAYS, DEPENDING ON CLINICAL CONDITION.  Note: This dictation was prepared with Dragon dictation along with smaller phrase technology. Any transcriptional errors that result from this process are unintentional.  Torri Langston M.D on 12/06/2015 at 4:00 PM  Between 7am to 6pm - Pager - [phone]  After 6pm go to [email or web address] - password [email or web address] [facility]  [facility]  Office  [phone]  CC: Primary  care physician; [name], MD

## 2015-12-07 LAB — GLUCOSE, CAPILLARY
GLUCOSE-CAPILLARY: 160 mg/dL — AB (ref 65–99)
Glucose-Capillary: 143 mg/dL — ABNORMAL HIGH (ref 65–99)
Glucose-Capillary: 170 mg/dL — ABNORMAL HIGH (ref 65–99)
Glucose-Capillary: 144 mg/dL — ABNORMAL HIGH (ref 65–99)

## 2015-12-07 LAB — HSV CULTURE AND TYPING

## 2015-12-07 NOTE — Progress Notes (Addendum)
Pt reports episode tonite of "seeing things and hearing things" while she had her eyes closed. She also reported weakness upon wakening and difficulty holding her cup briefly. Pt explains that she has noticed all of these symptoms intermittently since her prior hospitalization. Neuro assessment intact, pt A&Ox4. BP 110/58, HR 90, 98% RA, Glucose 160. PERRL, MAE equally, strong grips bilaterally. Dr. Ara Kussmaul notified, no new orders received. Will monitor patient closely. Pt informed to notify RN if sx returned.

## 2015-12-07 NOTE — Progress Notes (Signed)
[facility]  ROUNDING NOTE   Subjective:  Cough continues to significantly improved. Patient breathing comfortably. She is due for hemodialysis tomorrow. States that she is a bit sleepy today.  Objective:  Vital signs in last 24 hours:  Temp:  [98 F (36.7 C)-99.1 F (37.3 C)] 98.2 F (36.8 C) (10/01 1421) Pulse Rate:  [90-99] 93 (10/01 1421) Resp:  [16-18] 16 (10/01 1421) BP: (95-111)/(40-62) 111/44 (10/01 1421) SpO2:  [94 %-99 %] 98 % (10/01 1421)  Weight change:  Filed Weights   12/03/15 1316 12/05/15 1500 12/05/15 1839  Weight: 50.1 kg (110 lb 8 oz) 49.6 kg (109 lb 4.8 oz) 46.9 kg (103 lb 8 oz)    Intake/Output: I/O last 3 completed shifts: In: 120 [P.O.:120] Out: 700 [Urine:700]   Intake/Output this shift:  No intake/output data recorded.  Physical Exam: General: No acute distress  Head: Normocephalic, atraumatic. Moist oral mucosal membranes  Eyes: Anicteric  Neck: Supple, trachea midline  Lungs:  Mild bilateral rales, normal effort  Heart: S1S2 no rubs  Abdomen:  Soft, non tender, BS present  Extremities: no peripheral edema.  Neurologic: Nonfocal, moving all four extremities  Skin: No lesions  Access: [facility] permcath 9/13 Dr. Lucky Cowboy, rt IJ port    Basic Metabolic Panel:  Recent Labs Lab 12/01/15 2042 12/02/15 0416 12/03/15 0550 12/05/15 0521  NA 139 139 136  --   K 3.3* 3.3* 3.5  --   CL 98* 102 102  --   CO2 27 27 20*  --   GLUCOSE 176* 146* 160*  --   BUN 33* 40* 52*  --   CREATININE 3.01* 3.68* 4.61*  --   CALCIUM 8.7* 7.8* 8.2*  --   PHOS  --   --  4.9* 4.4    Liver Function Tests:  Recent Labs Lab 12/01/15 2042  AST 142*  ALT 48  ALKPHOS 104  BILITOT 1.4*  PROT 7.6  ALBUMIN 3.2*   No results for input(s): LIPASE, AMYLASE in the last 168 hours. No results for input(s): AMMONIA in the last 168 hours.  CBC:  Recent Labs Lab 12/01/15 2042 12/02/15 0416 12/02/15 1909 12/03/15 0550 12/05/15 0520  WBC 5.5 3.6  --   4.0 6.3  NEUTROABS 4.5  --   --   --   --   HGB 7.9* 6.5* 8.9* 8.7* 8.9*  HCT 22.9* 19.3* 26.3* 25.5* 26.1*  MCV 88.2 89.6  --  87.7 88.4  PLT 126* 98*  --  106* 113*    Cardiac Enzymes: No results for input(s): CKTOTAL, CKMB, CKMBINDEX, TROPONINI in the last 168 hours.  BNP: Invalid input(s): POCBNP  CBG:  Recent Labs Lab 12/06/15 1609 12/06/15 2035 12/07/15 0742 12/07/15 1126 12/07/15 1635  GLUCAP 192* 96 143* 170* 144*    Microbiology: Results for orders placed or performed during the hospital encounter of 12/01/15  Blood Culture (routine x 2)     Status: None   Collection Time: 12/01/15  8:42 PM  Result Value Ref Range Status   Specimen Description BLOOD LEFT HAND  Final   Special Requests BOTTLES DRAWN AEROBIC AND ANAEROBIC 5ML  Final   Culture NO GROWTH 5 DAYS  Final   Report Status 12/06/2015 FINAL  Final  Blood Culture (routine x 2)     Status: None   Collection Time: 12/01/15  8:42 PM  Result Value Ref Range Status   Specimen Description BLOOD PORTA CATH  Final   Special Requests BOTTLES DRAWN AEROBIC AND  ANAEROBIC 7ML  Final   Culture NO GROWTH 5 DAYS  Final   Report Status 12/06/2015 FINAL  Final  Urine culture     Status: Abnormal   Collection Time: 12/01/15  8:42 PM  Result Value Ref Range Status   Specimen Description URINE, RANDOM  Final   Special Requests NONE  Final   Culture (A)  Final    <10,000 COLONIES/mL INSIGNIFICANT GROWTH Performed at [facility]    Report Status 12/03/2015 FINAL  Final  Acid Fast Smear (AFB)     Status: None   Collection Time: 12/04/15 11:55 AM  Result Value Ref Range Status   AFB Specimen Processing Concentration  Final   Acid Fast Smear Negative  Final    Comment: (NOTE) Performed At: [facility] [place], [place] [id number] Lindon Romp MD AN:[id number]    Source (AFB) BRONCHIAL ALVEOLAR LAVAGE  Final  Hsv Culture And Typing     Status: None   Collection Time: 12/04/15  11:55 AM  Result Value Ref Range Status   HSV Culture/Type Comment  Final    Comment: (NOTE) Negative No Herpes simplex virus isolated. Performed At: [facility] [address] [place], [place] [id number] Lindon Romp MD LT:[id number]    Source of Sample BRONCHIAL ALVEOLAR LAVAGE  Final  Culture, fungus without smear ([facility]-Only)     Status: None (Preliminary result)   Collection Time: 12/04/15 11:55 AM  Result Value Ref Range Status   Specimen Description BRONCHIAL ALVEOLAR LAVAGE  Final   Special Requests Immunocompromised  Final   Culture   Final    NO FUNGUS ISOLATED AFTER 2 DAYS Performed at [facility]    Report Status PENDING  Incomplete  Culture, bal-quantitative     Status: None   Collection Time: 12/04/15 11:55 AM  Result Value Ref Range Status   Specimen Description BRONCHIAL ALVEOLAR LAVAGE  Final   Special Requests Immunocompromised  Final   Gram Stain   Final    FEW WBC PRESENT,BOTH PMN AND MONONUCLEAR NO ORGANISMS SEEN    Culture   Final    NO GROWTH 2 DAYS Performed at [facility]    Report Status 12/06/2015 FINAL  Final    Coagulation Studies: No results for input(s): LABPROT, INR in the last 72 hours.  Urinalysis: No results for input(s): COLORURINE, LABSPEC, PHURINE, GLUCOSEU, HGBUR, BILIRUBINUR, KETONESUR, PROTEINUR, UROBILINOGEN, NITRITE, LEUKOCYTESUR in the last 72 hours.  Invalid input(s): APPERANCEUR    Imaging: US Transvaginal Non-ob  Result Date: 12/06/2015 CLINICAL DATA:  Postmenopausal bleeding. EXAM: TRANSABDOMINAL AND TRANSVAGINAL ULTRASOUND OF PELVIS TECHNIQUE: Both transabdominal and transvaginal ultrasound examinations of the pelvis were performed. Transabdominal technique was performed for global imaging of the pelvis including uterus, ovaries, adnexal regions, and pelvic cul-de-sac. It was necessary to proceed with endovaginal exam following the transabdominal exam to visualize the endometrium and ovaries.  COMPARISON:  None FINDINGS: Uterus Measurements: 7.1 x 3.1 x 3.8 cm. There is a 6 x 3 x 7 mm fibroid in the mid uterus. Endometrium Thickness: 5.6 mm on endovaginal imaging. No focal abnormality visualized. Right ovary Not visualized. Left ovary Not visualized. Other findings There is a 13 mm nabothian cyst.  No free fluid in the pelvis. IMPRESSION: 1. No focal endometrial mass is identified. However, the endometrial stripe thickness was measured at 5.6 mm. Normal is less than 5 mm. Recommend gynecologic consultation and tissue sampling given the history of postmenopausal bleeding and an endometrial stripe thickness of  greater than 5 mm. These results will be called to the ordering clinician or representative by the Radiologist Assistant, and communication documented in the PACS or zVision Dashboard. Electronically Signed   By: Dorise Bullion III M.D   On: 12/06/2015 14:05   US Pelvis Complete  Result Date: 12/06/2015 CLINICAL DATA:  Postmenopausal bleeding. EXAM: TRANSABDOMINAL AND TRANSVAGINAL ULTRASOUND OF PELVIS TECHNIQUE: Both transabdominal and transvaginal ultrasound examinations of the pelvis were performed. Transabdominal technique was performed for global imaging of the pelvis including uterus, ovaries, adnexal regions, and pelvic cul-de-sac. It was necessary to proceed with endovaginal exam following the transabdominal exam to visualize the endometrium and ovaries. COMPARISON:  None FINDINGS: Uterus Measurements: 7.1 x 3.1 x 3.8 cm. There is a 6 x 3 x 7 mm fibroid in the mid uterus. Endometrium Thickness: 5.6 mm on endovaginal imaging. No focal abnormality visualized. Right ovary Not visualized. Left ovary Not visualized. Other findings There is a 13 mm nabothian cyst.  No free fluid in the pelvis. IMPRESSION: 1. No focal endometrial mass is identified. However, the endometrial stripe thickness was measured at 5.6 mm. Normal is less than 5 mm. Recommend gynecologic consultation and tissue sampling  given the history of postmenopausal bleeding and an endometrial stripe thickness of greater than 5 mm. These results will be called to the ordering clinician or representative by the Radiologist Assistant, and communication documented in the PACS or zVision Dashboard. Electronically Signed   By: Dorise Bullion III M.D   On: 12/06/2015 14:05     Medications:     . butamben-tetracaine-benzocaine  1 spray Topical Once  . chlorpheniramine-HYDROcodone  5 mL Oral Q12H  . epoetin (EPOGEN/PROCRIT) injection  10,000 Units Intravenous Q M,W,F-HD  . feeding supplement (ENSURE ENLIVE)  237 mL Oral BID BM  . ferrous sulfate  325 mg Oral Q breakfast  . furosemide  80 mg Oral Daily  . gabapentin  300 mg Oral TID  . heparin  5,000 Units Subcutaneous Q8H  . insulin aspart  0-5 Units Subcutaneous QHS  . insulin aspart  0-9 Units Subcutaneous TID WC  . levofloxacin (LEVAQUIN) IV  500 mg Intravenous Q48H  . lidocaine  1 application Topical Once   acetaminophen **OR** acetaminophen, benzonatate, guaiFENesin-codeine, ipratropium-albuterol, ipratropium-albuterol, ondansetron **OR** ondansetron (ZOFRAN) IV, oxyCODONE, phenylephrine  Assessment/ Plan:  50 y.o.hispanic female with diabetes mellitus type 2, hypertension, multiple myeloma on chemotherapy, osteoarthritis, chronic kidney disease stage III   1. Acute renal failure on CKD stage III baseline Cr 1.4 from 8/28/217.  Concern for progression of myeloma kidney. Nonoliguric urine output. - 24 hour for creatinine clearance 7.  Suspect shes progressed to ESRD.   - No acute indication for dialysis today. Patient will have hemodialysis tomorrow.  2. 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 With thrombocytopenia.  - Oncology: Dr. Grayland Ormond -  Last hemoglobin was 8.9. We will maintain the patient on Epogen 10,000 units IV with dialysis.  3. Multiple Myeloma:  Patient appears to have had progression of her underlying multiple myeloma.  She is being  considered for further therapy after acute issues including infection resolved.  4.  Pneumonia, bacterial vs fungal.  J18.9.  Patient found to have atypical pattern of pneumonitis on chest CT.  She has underlying multiple myeloma and can be considered as being immunosuppressed.  Status post bronchoscopy 12/04/15 - Patient improved significantly with antibiotic therapy. Her cough has also improved significantly. Infectious disease following as well.  BAL bacterial and fungal cultures thus far negative.  LOS: 6 Jamie Keller 10/1/20175:36 PM

## 2015-12-07 NOTE — Progress Notes (Signed)
[facility] [place] NAME: Jamie Keller    MR#:  [id number]  DATE OF BIRTH:  03-Aug-1965  SUBJECTIVE:   Via interpreter  Cough much better with tussionex - no fever No vaginal bleeding REVIEW OF SYSTEMS:   Review of Systems  Constitutional: Positive for malaise/fatigue. Negative for chills, fever and weight loss.  HENT: Negative for ear discharge, ear pain and nosebleeds.   Eyes: Negative for blurred vision, pain and discharge.  Respiratory: Positive for cough. Negative for sputum production, shortness of breath, wheezing and stridor.   Cardiovascular: Negative for chest pain, palpitations, orthopnea and PND.  Gastrointestinal: Negative for abdominal pain, diarrhea, nausea and vomiting.  Genitourinary: Negative for frequency and urgency.  Musculoskeletal: Negative for back pain and joint pain.  Neurological: Positive for weakness. Negative for sensory change, speech change and focal weakness.  Psychiatric/Behavioral: Negative for depression and hallucinations. The patient is not nervous/anxious.    Tolerating Diet:yes Tolerating PT: ambulatory  DRUG ALLERGIES:   Allergies  Allergen Reactions  . No Known Allergies     VITALS:  Blood pressure (!) 111/44, pulse 93, temperature 98.2 F (36.8 C), temperature source Oral, resp. rate 16, height 5' 2"  (1.575 m), weight 46.9 kg (103 lb 8 oz), last menstrual period 11/20/1985, SpO2 98 %.  PHYSICAL EXAMINATION:   Physical Exam  GENERAL:  50 y.o.-year-old patient lying in the bed with no acute distress.  EYES: Pupils equal, round, reactive to light and accommodation. No scleral icterus. Extraocular muscles intact.  HEENT: Head atraumatic, normocephalic. Oropharynx and nasopharynx clear.  NECK:  Supple, no jugular venous distention. No thyroid enlargement, no tenderness.  LUNGS: Normal breath sounds bilaterally, no wheezing, rales, rhonchi. No use of accessory muscles of  respiration.  CARDIOVASCULAR: S1, S2 normal. No murmurs, rubs, or gallops.  ABDOMEN: Soft, nontender, nondistended. Bowel sounds present. No organomegaly or mass.  EXTREMITIES: No cyanosis, clubbing or edema b/l.    NEUROLOGIC: Cranial nerves II through XII are intact. No focal Motor or sensory deficits b/l.   PSYCHIATRIC:  patient is alert and oriented x 3.  SKIN: No obvious rash, lesion, or ulcer.   LABORATORY PANEL:  CBC  Recent Labs Lab 12/05/15 0520  WBC 6.3  HGB 8.9*  HCT 26.1*  PLT 113*    Chemistries   Recent Labs Lab 12/01/15 2042  12/03/15 0550  NA 139  < > 136  K 3.3*  < > 3.5  CL 98*  < > 102  CO2 27  < > 20*  GLUCOSE 176*  < > 160*  BUN 33*  < > 52*  CREATININE 3.01*  < > 4.61*  CALCIUM 8.7*  < > 8.2*  AST 142*  --   --   ALT 48  --   --   ALKPHOS 104  --   --   BILITOT 1.4*  --   --   < > = values in this interval not displayed. Cardiac Enzymes No results for input(s): TROPONINI in the last 168 hours. RADIOLOGY:  US Transvaginal Non-ob  Result Date: 12/06/2015 CLINICAL DATA:  Postmenopausal bleeding. EXAM: TRANSABDOMINAL AND TRANSVAGINAL ULTRASOUND OF PELVIS TECHNIQUE: Both transabdominal and transvaginal ultrasound examinations of the pelvis were performed. Transabdominal technique was performed for global imaging of the pelvis including uterus, ovaries, adnexal regions, and pelvic cul-de-sac. It was necessary to proceed with endovaginal exam following the transabdominal exam to visualize the endometrium and ovaries. COMPARISON:  None FINDINGS: Uterus Measurements: 7.1  x 3.1 x 3.8 cm. There is a 6 x 3 x 7 mm fibroid in the mid uterus. Endometrium Thickness: 5.6 mm on endovaginal imaging. No focal abnormality visualized. Right ovary Not visualized. Left ovary Not visualized. Other findings There is a 13 mm nabothian cyst.  No free fluid in the pelvis. IMPRESSION: 1. No focal endometrial mass is identified. However, the endometrial stripe thickness was  measured at 5.6 mm. Normal is less than 5 mm. Recommend gynecologic consultation and tissue sampling given the history of postmenopausal bleeding and an endometrial stripe thickness of greater than 5 mm. These results will be called to the ordering clinician or representative by the Radiologist Assistant, and communication documented in the PACS or zVision Dashboard. Electronically Signed   By: Dorise Bullion III M.D   On: 12/06/2015 14:05   US Pelvis Complete  Result Date: 12/06/2015 CLINICAL DATA:  Postmenopausal bleeding. EXAM: TRANSABDOMINAL AND TRANSVAGINAL ULTRASOUND OF PELVIS TECHNIQUE: Both transabdominal and transvaginal ultrasound examinations of the pelvis were performed. Transabdominal technique was performed for global imaging of the pelvis including uterus, ovaries, adnexal regions, and pelvic cul-de-sac. It was necessary to proceed with endovaginal exam following the transabdominal exam to visualize the endometrium and ovaries. COMPARISON:  None FINDINGS: Uterus Measurements: 7.1 x 3.1 x 3.8 cm. There is a 6 x 3 x 7 mm fibroid in the mid uterus. Endometrium Thickness: 5.6 mm on endovaginal imaging. No focal abnormality visualized. Right ovary Not visualized. Left ovary Not visualized. Other findings There is a 13 mm nabothian cyst.  No free fluid in the pelvis. IMPRESSION: 1. No focal endometrial mass is identified. However, the endometrial stripe thickness was measured at 5.6 mm. Normal is less than 5 mm. Recommend gynecologic consultation and tissue sampling given the history of postmenopausal bleeding and an endometrial stripe thickness of greater than 5 mm. These results will be called to the ordering clinician or representative by the Radiologist Assistant, and communication documented in the PACS or zVision Dashboard. Electronically Signed   By: Dorise Bullion III M.D   On: 12/06/2015 14:05   ASSESSMENT AND PLAN:   50 year old with multiple myeloma presenting with sepsis and  pneumonia #1 Sepsis ([place]) - Due to pneumonia CT scan shows atypical pneumonia, possible fungal Appreciate infectious disease input status post bronchoscopy on 12/04/15 further therapy based on findings of her him bronc results and cultures. TB is being ruled out -was on IV vanc, levaquin and cefepime---since BAL is negative per ID rec d/c vanc and cefepime  #2 multiple myeloma with worsening anemia status post transfusion Hemoglobin stable  #3End stage renal failure on dialysis [facility]) - nephrology following for dialysis   #4 HTN (hypertension) - stable, continue home meds  #5 Depression - continue home meds  #6  multiple myeloma prognosis poor oncology input appreciated  Case discussed with Care Management/Social Worker. Management plans discussed with the patient, family and they are in agreement.  CODE STATUS: full  DVT Prophylaxis: heparin  TOTAL TIME TAKING CARE OF THIS PATIENT: 30 minutes.  >50% time spent on counselling and coordination of care  POSSIBLE D/C IN 1 DAYS, DEPENDING ON CLINICAL CONDITION.  Note: This dictation was prepared with Dragon dictation along with smaller phrase technology. Any transcriptional errors that result from this process are unintentional.  Chi Woodham M.D on 12/07/2015 at 3:05 PM  Between 7am to 6pm - Pager - [phone]  After 6pm go to [email or web address] - password [email or web address] [facility]  [facility]  Office  [phone]  CC: Primary  care physician; [name], MD

## 2015-12-08 ENCOUNTER — Emergency Department: Payer: Self-pay

## 2015-12-08 ENCOUNTER — Encounter: Payer: Self-pay | Admitting: *Deleted

## 2015-12-08 ENCOUNTER — Inpatient Hospital Stay: Payer: MEDICAID | Admitting: Oncology

## 2015-12-08 ENCOUNTER — Emergency Department
Admission: EM | Admit: 2015-12-08 | Discharge: 2015-12-08 | Disposition: A | Discharge status: Home / Self Care | Payer: Self-pay | Attending: Emergency Medicine | Admitting: Emergency Medicine

## 2015-12-08 DIAGNOSIS — Y9289 Other specified places as the place of occurrence of the external cause: Secondary | ICD-10-CM | POA: Insufficient documentation

## 2015-12-08 DIAGNOSIS — Z992 Dependence on renal dialysis: Secondary | ICD-10-CM | POA: Insufficient documentation

## 2015-12-08 DIAGNOSIS — W1839XA Other fall on same level, initial encounter: Secondary | ICD-10-CM | POA: Insufficient documentation

## 2015-12-08 DIAGNOSIS — N186 End stage renal disease: Secondary | ICD-10-CM | POA: Insufficient documentation

## 2015-12-08 DIAGNOSIS — Z79899 Other long term (current) drug therapy: Secondary | ICD-10-CM | POA: Insufficient documentation

## 2015-12-08 DIAGNOSIS — W19XXXA Unspecified fall, initial encounter: Secondary | ICD-10-CM

## 2015-12-08 DIAGNOSIS — S0990XA Unspecified injury of head, initial encounter: Secondary | ICD-10-CM | POA: Insufficient documentation

## 2015-12-08 DIAGNOSIS — E1122 Type 2 diabetes mellitus with diabetic chronic kidney disease: Secondary | ICD-10-CM | POA: Insufficient documentation

## 2015-12-08 DIAGNOSIS — S7001XA Contusion of right hip, initial encounter: Secondary | ICD-10-CM | POA: Insufficient documentation

## 2015-12-08 DIAGNOSIS — I120 Hypertensive chronic kidney disease with stage 5 chronic kidney disease or end stage renal disease: Secondary | ICD-10-CM | POA: Insufficient documentation

## 2015-12-08 DIAGNOSIS — Y999 Unspecified external cause status: Secondary | ICD-10-CM | POA: Insufficient documentation

## 2015-12-08 DIAGNOSIS — Y9301 Activity, walking, marching and hiking: Secondary | ICD-10-CM | POA: Insufficient documentation

## 2015-12-08 HISTORY — DX: Sepsis, unspecified organism: A41.9

## 2015-12-08 HISTORY — DX: Pneumonia, unspecified organism: J18.9

## 2015-12-08 LAB — CHLAMYDIA PANEL SERUM
C. Psittaci IgM Serum: <1:20 {titer}
C. Trachomatis IgM Serum: <1:20 {titer}

## 2015-12-08 LAB — CYTOLOGY - NON PAP

## 2015-12-08 LAB — SURGICAL PATHOLOGY

## 2015-12-08 LAB — GLUCOSE, CAPILLARY: Glucose-Capillary: 131 mg/dL — ABNORMAL HIGH (ref 65–99)

## 2015-12-08 LAB — VANCOMYCIN, TROUGH: Vancomycin Tr: <4 ug/mL — ABNORMAL LOW (ref 15–20)

## 2015-12-08 LAB — CRYPTOCOCCUS ANTIGEN, SERUM: CRYPTOCOCCUS ANTIGEN, SERUM: NEGATIVE

## 2015-12-08 MED ORDER — BENZONATATE 200 MG PO CAPS: 200.0000 mg | ORAL_CAPSULE | Freq: Three times a day (TID) | ORAL | 0 refills | Status: DC | PRN

## 2015-12-08 MED ORDER — LEVOFLOXACIN 500 MG PO TABS: 500.0000 mg | ORAL_TABLET | ORAL | Status: DC

## 2015-12-08 MED ORDER — ENSURE ENLIVE PO LIQD: 237.0000 mL | Freq: Two times a day (BID) | ORAL | 12 refills | Status: DC

## 2015-12-08 MED ORDER — LEVOFLOXACIN 500 MG PO TABS: 500.0000 mg | ORAL_TABLET | ORAL | 0 refills | Status: DC

## 2015-12-08 NOTE — ED Triage Notes (Signed)
Pt was discharged today from hospital. Pt has had complicated recent medical hx including sepsis and kidney dysfunction for which she has been admitted to hospital twice in past 3 weeks. Pt has complained of weakness and dizziiness prior to discharge today, daughter questioned if the pt should have mobility device to assist her and was told no. Pt was exiting vehicle and fell down, injuring her R occipital scalp and R hip.  Daughter stated that pt took some time to be verbally responsive and normal. Pt is A & O x 2.

## 2015-12-08 NOTE — Plan of Care (Signed)
Problem: Education: Goal: Knowledge of [facility] Education information/materials will improve Outcome: Progressing Interpreter utilized

## 2015-12-08 NOTE — Progress Notes (Signed)
HD START 

## 2015-12-08 NOTE — Progress Notes (Addendum)
[facility] at [place] NAME: Jamie Keller    MR#:  [id number]  DATE OF BIRTH:  April 18, 1965  DATE OF ADMISSION:  12/01/2015 ADMITTING PHYSICIAN: Lance Coon, MD  DATE OF DISCHARGE: 12/08/15  PRIMARY CARE PHYSICIAN: [name], MD    ADMISSION DIAGNOSIS:  Sepsis, due to unspecified organism ([place]) [A41.9]  DISCHARGE DIAGNOSIS:  Acute respiratory failure due to atypical pneumonia Sepsis -resolved MM on [place] ESRD on HD Vaginal bleeding resolved-f/u gyn as outpt  SECONDARY DIAGNOSIS:   Past Medical History:  Diagnosis Date  . Diabetes mellitus without complication ([place])   . Hypertension   . Multiple myeloma ([place])   . Osteoarthritis   . Post-menopausal 2014    HOSPITAL COURSE:  50 year old with multiple myeloma presenting with sepsis and pneumonia #1 Sepsis ([place]) - Due to pneumonia CT scan shows atypical pneumoniaAppreciate infectious disease input status post bronchoscopy on 12/04/15 further therapy based on findings of her him bronc results and cultures. TB is being ruled out. AFB smear negativ. AFB cultures pending -was on IV vanc, levaquin and cefepime---since BAL is negative per ID rec d/c vanc and cefepime -complted course with levaquin 3 more doses to go  #2 multiple myeloma with worsening anemia status post transfusion Hemoglobin stable  #3End stage renal failure on dialysis [facility]) - nephrology following for dialysis  #4 HTN (hypertension) - stable, continue home meds  #5 Depression - continue home meds  #6 Vaginal bleeding resolved US pelvis shows mild endometrial wall thickening. Pt will f/u dr Glennon Mac gyn as out pt  Pt will d/c to home after HD today She will f/u Dr Ola Spurr in 2 weeks  CONSULTS OBTAINED:  Treatment Team:  Anthonette Legato, MD Lloyd Huger, MD Leonel Ramsay, MD Will Bonnet, MD Fritzi Mandes, MD  DRUG ALLERGIES:   Allergies  Allergen Reactions   . No Known Allergies     DISCHARGE MEDICATIONS:   Current Discharge Medication List    START taking these medications   Details  benzonatate (TESSALON) 200 MG capsule Take 1 capsule (200 mg total) by mouth 3 (three) times daily as needed for cough. Qty: 20 capsule, Refills: 0    feeding supplement, ENSURE ENLIVE, (ENSURE ENLIVE) LIQD Take 237 mLs by mouth 2 (two) times daily between meals. Qty: 237 mL, Refills: 12    levofloxacin (LEVAQUIN) 500 MG tablet Take 1 tablet (500 mg total) by mouth every other day. Qty: 3 tablet, Refills: 0      CONTINUE these medications which have NOT CHANGED   Details  ferrous sulfate 325 (65 FE) MG tablet Take 1 tablet (325 mg total) by mouth daily with breakfast. Qty: 30 tablet, Refills: 0    furosemide (LASIX) 80 MG tablet Take 1 tablet (80 mg total) by mouth daily. Qty: 30 tablet, Refills: 0    gabapentin (NEURONTIN) 300 MG capsule Take 1 capsule (300 mg total) by mouth 3 (three) times daily. Qty: 90 capsule, Refills: 2    Oxycodone HCl 10 MG TABS Take 1 tablet (10 mg total) by mouth every 6 (six) hours as needed. Qty: 60 tablet, Refills: 0    verapamil (CALAN) 40 MG tablet Take 1 tablet (40 mg total) by mouth 2 (two) times daily. Qty: 60 tablet, Refills: 0        If you experience worsening of your admission symptoms, develop shortness of breath, life threatening emergency, suicidal or homicidal thoughts you must seek medical attention immediately by calling  911 or calling your MD immediately  if symptoms less severe.  You Must read complete instructions/literature along with all the possible adverse reactions/side effects for all the Medicines you take and that have been prescribed to you. Take any new Medicines after you have completely understood and accept all the possible adverse reactions/side effects.   Please note  You were cared for by a hospitalist during your hospital stay. If you have any questions about your discharge  medications or the care you received while you were in the hospital after you are discharged, you can call the unit and asked to speak with the hospitalist on call if the hospitalist that took care of you is not available. Once you are discharged, your primary care physician will handle any further medical issues. Please note that NO REFILLS for any discharge medications will be authorized once you are discharged, as it is imperative that you return to your primary care physician (or establish a relationship with a primary care physician if you do not have one) for your aftercare needs so that they can reassess your need for medications and monitor your lab values. Today   SUBJECTIVE   Via interpreter C/o weakness  VITAL SIGNS:  Blood pressure (!) 113/51, pulse 96, temperature 98.1 F (36.7 C), temperature source Oral, resp. rate 20, height 5' 2"  (1.575 m), weight 47.8 kg (105 lb 4.8 oz), last menstrual period 11/20/1985, SpO2 96 %.  I/O:   Intake/Output Summary (Last 24 hours) at 12/08/15 0835 Last data filed at 12/08/15 0643  Gross per 24 hour  Intake                0 ml  Output              250 ml  Net             -250 ml    PHYSICAL EXAMINATION:  GENERAL:  50 y.o.-year-old patient lying in the bed with no acute distress.  EYES: Pupils equal, round, reactive to light and accommodation. No scleral icterus. Extraocular muscles intact.  HEENT: Head atraumatic, normocephalic. Oropharynx and nasopharynx clear.  NECK:  Supple, no jugular venous distention. No thyroid enlargement, no tenderness.  LUNGS: coarse breath sounds bilaterally, no wheezing, rales,rhonchi or crepitation. No use of accessory muscles of respiration.  CARDIOVASCULAR: S1, S2 normal. No murmurs, rubs, or gallops.  ABDOMEN: Soft, non-tender, non-distended. Bowel sounds present. No organomegaly or mass.  EXTREMITIES: No pedal edema, cyanosis, or clubbing.  NEUROLOGIC: Cranial nerves II through XII are intact. Muscle  strength 5/5 in all extremities. Sensation intact. Gait not checked.  PSYCHIATRIC: The patient is alert and oriented x 3.  SKIN: No obvious rash, lesion, or ulcer.   DATA REVIEW:   CBC   Recent Labs Lab 12/05/15 0520  WBC 6.3  HGB 8.9*  HCT 26.1*  PLT 113*    Chemistries   Recent Labs Lab 12/01/15 2042  12/03/15 0550  NA 139  < > 136  K 3.3*  < > 3.5  CL 98*  < > 102  CO2 27  < > 20*  GLUCOSE 176*  < > 160*  BUN 33*  < > 52*  CREATININE 3.01*  < > 4.61*  CALCIUM 8.7*  < > 8.2*  AST 142*  --   --   ALT 48  --   --   ALKPHOS 104  --   --   BILITOT 1.4*  --   --   < > =  values in this interval not displayed.  Microbiology Results   Recent Results (from the past 240 hour(s))  Blood Culture (routine x 2)     Status: None   Collection Time: 12/01/15  8:42 PM  Result Value Ref Range Status   Specimen Description BLOOD LEFT HAND  Final   Special Requests BOTTLES DRAWN AEROBIC AND ANAEROBIC 5ML  Final   Culture NO GROWTH 5 DAYS  Final   Report Status 12/06/2015 FINAL  Final  Blood Culture (routine x 2)     Status: None   Collection Time: 12/01/15  8:42 PM  Result Value Ref Range Status   Specimen Description BLOOD PORTA CATH  Final   Special Requests BOTTLES DRAWN AEROBIC AND ANAEROBIC 7ML  Final   Culture NO GROWTH 5 DAYS  Final   Report Status 12/06/2015 FINAL  Final  Urine culture     Status: Abnormal   Collection Time: 12/01/15  8:42 PM  Result Value Ref Range Status   Specimen Description URINE, RANDOM  Final   Special Requests NONE  Final   Culture (A)  Final    <10,000 COLONIES/mL INSIGNIFICANT GROWTH Performed at [facility]    Report Status 12/03/2015 FINAL  Final  Acid Fast Smear (AFB)     Status: None   Collection Time: 12/04/15 11:55 AM  Result Value Ref Range Status   AFB Specimen Processing Concentration  Final   Acid Fast Smear Negative  Final    Comment: (NOTE) Performed At: [facility] [place], [place]  [id number] Lindon Romp MD WU:[id number]    Source (AFB) BRONCHIAL ALVEOLAR LAVAGE  Final  Hsv Culture And Typing     Status: None   Collection Time: 12/04/15 11:55 AM  Result Value Ref Range Status   HSV Culture/Type Comment  Final    Comment: (NOTE) Negative No Herpes simplex virus isolated. Performed At: [facility] [address] [place], [place] [id number] Lindon Romp MD [id number]    Source of Sample BRONCHIAL ALVEOLAR LAVAGE  Final  Culture, fungus without smear ([facility]-Only)     Status: None (Preliminary result)   Collection Time: 12/04/15 11:55 AM  Result Value Ref Range Status   Specimen Description BRONCHIAL ALVEOLAR LAVAGE  Final   Special Requests Immunocompromised  Final   Culture   Final    NO FUNGUS ISOLATED AFTER 2 DAYS Performed at [facility]    Report Status PENDING  Incomplete  Culture, bal-quantitative     Status: None   Collection Time: 12/04/15 11:55 AM  Result Value Ref Range Status   Specimen Description BRONCHIAL ALVEOLAR LAVAGE  Final   Special Requests Immunocompromised  Final   Gram Stain   Final    FEW WBC PRESENT,BOTH PMN AND MONONUCLEAR NO ORGANISMS SEEN    Culture   Final    NO GROWTH 2 DAYS Performed at [facility]    Report Status 12/06/2015 FINAL  Final    RADIOLOGY:  US Transvaginal Non-ob  Result Date: 12/06/2015 CLINICAL DATA:  Postmenopausal bleeding. EXAM: TRANSABDOMINAL AND TRANSVAGINAL ULTRASOUND OF PELVIS TECHNIQUE: Both transabdominal and transvaginal ultrasound examinations of the pelvis were performed. Transabdominal technique was performed for global imaging of the pelvis including uterus, ovaries, adnexal regions, and pelvic cul-de-sac. It was necessary to proceed with endovaginal exam following the transabdominal exam to visualize the endometrium and ovaries. COMPARISON:  None FINDINGS: Uterus Measurements: 7.1 x 3.1 x 3.8 cm. There is a 6 x 3 x 7  mm fibroid in the mid uterus.  Endometrium Thickness: 5.6 mm on endovaginal imaging. No focal abnormality visualized. Right ovary Not visualized. Left ovary Not visualized. Other findings There is a 13 mm nabothian cyst.  No free fluid in the pelvis. IMPRESSION: 1. No focal endometrial mass is identified. However, the endometrial stripe thickness was measured at 5.6 mm. Normal is less than 5 mm. Recommend gynecologic consultation and tissue sampling given the history of postmenopausal bleeding and an endometrial stripe thickness of greater than 5 mm. These results will be called to the ordering clinician or representative by the Radiologist Assistant, and communication documented in the PACS or zVision Dashboard. Electronically Signed   By: Dorise Bullion III M.D   On: 12/06/2015 14:05   US Pelvis Complete  Result Date: 12/06/2015 CLINICAL DATA:  Postmenopausal bleeding. EXAM: TRANSABDOMINAL AND TRANSVAGINAL ULTRASOUND OF PELVIS TECHNIQUE: Both transabdominal and transvaginal ultrasound examinations of the pelvis were performed. Transabdominal technique was performed for global imaging of the pelvis including uterus, ovaries, adnexal regions, and pelvic cul-de-sac. It was necessary to proceed with endovaginal exam following the transabdominal exam to visualize the endometrium and ovaries. COMPARISON:  None FINDINGS: Uterus Measurements: 7.1 x 3.1 x 3.8 cm. There is a 6 x 3 x 7 mm fibroid in the mid uterus. Endometrium Thickness: 5.6 mm on endovaginal imaging. No focal abnormality visualized. Right ovary Not visualized. Left ovary Not visualized. Other findings There is a 13 mm nabothian cyst.  No free fluid in the pelvis. IMPRESSION: 1. No focal endometrial mass is identified. However, the endometrial stripe thickness was measured at 5.6 mm. Normal is less than 5 mm. Recommend gynecologic consultation and tissue sampling given the history of postmenopausal bleeding and an endometrial stripe thickness of greater than 5 mm. These results will  be called to the ordering clinician or representative by the Radiologist Assistant, and communication documented in the PACS or zVision Dashboard. Electronically Signed   By: Dorise Bullion III M.D   On: 12/06/2015 14:05     Management plans discussed with the patient, family and they are in agreement.  CODE STATUS:     Code Status Orders        Start     Ordered   12/02/15 0111  Full code  Continuous     12/02/15 0110    Code Status History    Date Active Date Inactive Code Status Order ID Comments User Context   11/14/2015 12:52 AM 11/17/2015  1:12 PM Full Code [id number]  Holley Raring, NP ED   11/14/2015 12:06 AM 11/14/2015 12:12 AM Full Code [id number]  Holley Raring, NP ED      TOTAL TIME TAKING CARE OF THIS PATIENT:40 minutes.    Dayden Viverette M.D on 12/08/2015 at 8:35 AM  Between 7am to 6pm - Pager - [phone] After 6pm go to [email or web address] - password [email or web address] [facility]  Office  [phone]  CC: Primary care physician; [name], MD

## 2015-12-08 NOTE — Progress Notes (Signed)
PRE HD DATA

## 2015-12-08 NOTE — Care Management (Signed)
Followed up with Jamie Keller had [facility].  Patient is now going to outpatient HD MWF at 330pm.

## 2015-12-08 NOTE — ED Notes (Signed)
Pt states she fell on concrete today and hit her head. Bump noted to posterior R side. Pt states head pain, R hip pain, R knee pain. Abrasion noted to below R knee. Pt was d/c today from upstairs for kidney failure, pneumonia with sepsis all in the last 3 weeks but d/c today. Pt denies dizziness, blurred vision, movement/sound bothering her. States she hears a noise, daughter says that she has been hearing noises from taking Codeine. Pt has cough with mask over face.

## 2015-12-08 NOTE — ED Provider Notes (Addendum)
[facility] Emergency Department Provider Note   ____________________________________________   First MD Initiated Contact with Patient 12/08/15 2007     (approximate)  I have reviewed the triage vital signs and the nursing notes.   HISTORY  Chief Complaint Fall; Dizziness; and Hip Pain   HPI Jamie Keller is a 50 y.o. female with a recent admission for sepsis, healthcare associated pneumonia and kidney failure who is presenting to the emergency department today after a fall. She was just discharged to home today from the hospital. She was walking from the car to her front door when she fell down hitting the right side of her head as well as her right hip. The patient says that she "missed a step," but the daughter says that she was not near any steps.  The daughter says that she was concerned that she would have difficulty walking at home because throughout her admission during hospital she was becoming more weak and confused. She says that she was especially weak and confused over the past 4 days and had been "wobbling" while walking. She was not discharged with a walker or a cane and she was walking unassisted at the time of the fall. There was also question of loss of consciousness and disorientation. The patient says that she has only minimal hip pain at this time is to the right hip.    Past Medical History:  Diagnosis Date  . Diabetes mellitus without complication ([place])   . Hypertension   . Multiple myeloma ([place])   . Osteoarthritis   . Pneumonia   . Post-menopausal 2014  . Sepsis [facility])     Patient Active Problem List   Diagnosis Date Noted  . Sepsis ([place]) 12/01/2015  . HCAP (healthcare-associated pneumonia) 12/01/2015  . End stage renal failure on dialysis ([place]) 12/01/2015  . HTN (hypertension) 12/01/2015  . Depression 12/01/2015  . Acute respiratory failure ([place]) 11/14/2015  . Acute renal failure ([place])   . Multiple myeloma ([place])  09/06/2015  . Post-menopausal   . Bulging lumbar disc 05/28/2014    Past Surgical History:  Procedure Laterality Date  . CESAREAN SECTION     x2  . PERIPHERAL VASCULAR CATHETERIZATION N/A 11/19/2015   Procedure: Dialysis/Perma Catheter Insertion;  Surgeon: Algernon Huxley, MD;  Location: [facility];  Service: Cardiovascular;  Laterality: N/A;    Prior to Admission medications   Medication Sig Start Date End Date Taking? Authorizing Provider  benzonatate (TESSALON) 200 MG capsule Take 1 capsule (200 mg total) by mouth 3 (three) times daily as needed for cough. 12/08/15   Fritzi Mandes, MD  feeding supplement, ENSURE ENLIVE, (ENSURE ENLIVE) LIQD Take 237 mLs by mouth 2 (two) times daily between meals. 12/08/15   Fritzi Mandes, MD  ferrous sulfate 325 (65 FE) MG tablet Take 1 tablet (325 mg total) by mouth daily with breakfast. 11/28/15   Hillary Bow, MD  furosemide (LASIX) 80 MG tablet Take 1 tablet (80 mg total) by mouth daily. 11/29/15   Srikar Sudini, MD  gabapentin (NEURONTIN) 300 MG capsule Take 1 capsule (300 mg total) by mouth 3 (three) times daily. 11/06/15   Lloyd Huger, MD  levofloxacin (LEVAQUIN) 500 MG tablet Take 1 tablet (500 mg total) by mouth every other day. 12/08/15   Fritzi Mandes, MD  Oxycodone HCl 10 MG TABS Take 1 tablet (10 mg total) by mouth every 6 (six) hours as needed. Patient taking differently: Take 10 mg by mouth every 6 (  six) hours as needed (pain).  10/03/15   Lloyd Huger, MD  verapamil (CALAN) 40 MG tablet Take 1 tablet (40 mg total) by mouth 2 (two) times daily. 11/28/15   Hillary Bow, MD    Allergies No known allergies  Family History  Problem Relation Age of Onset  . Hypercholesterolemia Mother   . Hypertension Father     Social History Social History  Substance Use Topics  . Smoking status: Never Smoker  . Smokeless tobacco: Never Used  . Alcohol use No    Review of Systems Constitutional: No fever/chills Eyes: No visual  changes. ENT: No sore throat. Cardiovascular: Denies chest pain. Respiratory: Denies shortness of breath. Gastrointestinal: No abdominal pain.  No nausea, no vomiting.  No diarrhea.  No constipation. Genitourinary: Negative for dysuria. Musculoskeletal: Negative for back pain. Skin: Negative for rash. Neurological: Negative for focal weakness or numbness.  10-point ROS otherwise negative.  ____________________________________________   PHYSICAL EXAM:  VITAL SIGNS: ED Triage Vitals  Enc Vitals Group     BP 12/08/15 1950 (!) 117/48     Pulse Rate 12/08/15 1950 (!) 104     Resp 12/08/15 1950 20     Temp 12/08/15 1950 98.4 F (36.9 C)     Temp Source 12/08/15 1950 Oral     SpO2 12/08/15 1950 95 %     Weight 12/08/15 1950 104 lb (47.2 kg)     Height 12/08/15 1950 _0  (1.549 m)     Head Circumference --      Peak Flow --      Pain Score 12/08/15 2005 3     Pain Loc --      Pain Edu? --      Excl. in [place]? --     Constitutional: Alert and oriented. Well appearing and in no acute distress. Eyes: Conjunctivae are normal. PERRL. EOMI. Head: 3 cm, round, hematoma to the right parietal region without any depression or bogginess. Mild tenderness to palpation. Nose: No congestion/rhinnorhea. Mouth/Throat: Mucous membranes are moist.  Oropharynx non-erythematous. Neck: No stridor.  Her tenderness to the midline cervical spine. No deformity or step-off. Patient able to range her neck fully without any restriction or pain. Cardiovascular: Tachycardia, regular rhythm. Grossly normal heart sounds.  Good peripheral circulation.  Right sided chest ports without any spreading erythema, induration or pus. Left-sided permacath without any erythema, induration or pus. Dressings are CDI. Respiratory: Normal respiratory effort.  No retractions. Lungs CTAB. Gastrointestinal: Soft and nontender. No distention.  Musculoskeletal: No lower extremity tenderness nor edema.  No joint  effusions. Neurologic:  Normal speech and language. No gross focal neurologic deficits are appreciated.  Patient able to ambulate unassisted but with an unsteady gait. Skin:  Skin is warm, dry and intact. No rash noted. Psychiatric: Mood and affect are normal. Speech and behavior are normal.  ____________________________________________   LABS (all labs ordered are listed, but only abnormal results are displayed)  Labs Reviewed - No data to display ____________________________________________  EKG  ED ECG REPORT I, Advik Weatherspoon,  Youlanda Roys, the attending physician, personally viewed and interpreted this ECG.   Date: 12/08/2015  EKG Time: 1949  Rate: 103  Rhythm: sinus tachycardia  Axis: Normal axis  Intervals:none  ST&T Change: No ST segment elevation or depression. No abnormal T-wave inversion.  ____________________________________________  [id number]  CT Head Wo Contrast (Accession [id number]) (Order [id number])  Imaging  Date: 12/08/2015 Department: [facility] EMERGENCY DEPARTMENT Released By/Authorizing: Orbie Pyo, MD (auto-released)  Exam Information   Status Exam Begun  Exam Ended   Final [99] 12/08/2015 8:52 PM 12/08/2015 9:03 PM  PACS Images   Show images for CT Head Wo Contrast  Study Result   CLINICAL DATA:  [place] on concrete today, striking the head. Bump on the right side.  EXAM: CT HEAD WITHOUT CONTRAST  TECHNIQUE: Contiguous axial images were obtained from the base of the skull through the vertex without intravenous contrast.  COMPARISON:  None.  FINDINGS: Brain: No evidence of acute infarction, hemorrhage, hydrocephalus, extra-axial collection or mass lesion/mass effect.  Vascular: No hyperdense vessel or unexpected calcification.  Skull: Normal. Negative for fracture or focal lesion.  Sinuses/Orbits: Mucosal thickening throughout the paranasal sinuses. Mastoid air cells are not opacified.  Other:  Small subcutaneous scalp hematoma over the right parietal region.  IMPRESSION: No acute intracranial abnormalities.   Electronically Signed   By: Lucienne Capers M.D.   On: 12/08/2015 21:31    DG Hip Unilat W or Wo Pelvis 2-3 Views Right (Accession [id number]) (Order [id number])  Imaging  Date: 12/08/2015 Department: [facility] EMERGENCY DEPARTMENT Released By/Authorizing: Orbie Pyo, MD (auto-released)  Exam Information   Status Exam Begun  Exam Ended   Final [99] 12/08/2015 8:33 PM 12/08/2015 8:57 PM  PACS Images   Show images for DG Hip Unilat W or Wo Pelvis 2-3 Views Right  Study Result   CLINICAL DATA:  [place] on concrete today, striking head. Right hip pain.  EXAM: DG HIP (WITH OR WITHOUT PELVIS) 2-3V RIGHT  COMPARISON:  None.  FINDINGS: Pelvis appears intact. SI joints and symphysis pubis are not displaced. Degenerative changes in the lower lumbar spine and hips. No evidence of acute fracture or dislocation in the pelvis or the right hip. There are focal rounded areas of sclerosis with central lucency demonstrated in the right femoral intratrochanteric region. This appearance is nonspecific in could be due to osteoporosis. However, focal bone lesions are not excluded. Suggest bone scan for further evaluation.  IMPRESSION: No acute bony abnormalities. Degenerative changes in the hips. Nonspecific areas of sclerosis and lucency in the inter trochanteric region of the right hip. Osteoporosis versus focal bone lesions. Suggest bone scan for further evaluation.   Electronically Signed   By: Lucienne Capers M.D.   On: 12/08/2015 21:27    ____________________________________________   PROCEDURES  Procedure(s) performed:   Procedures  Critical Care performed:   ____________________________________________   INITIAL IMPRESSION / ASSESSMENT AND PLAN / ED COURSE  Pertinent labs & imaging results that were  available during my care of the patient were reviewed by me and considered in my medical decision making (see chart for details).   ----------------------------------------- 10:02 PM on 12/08/2015 -----------------------------------------  Patient's heart rate is 96 bpm when I entered the room. Reassuring x-ray results as well as CAT scan without any acute findings. We discussed the lesions in the right hip and the patient will be following with her primary care doctor. The daughter says that they will have a walker at home and would like to be discharged home. They're most concerned about injury after the fall. The daughter is also concerned about the patient returning to her baseline mental status as well as walking. We discussed that as the patient is weaned off of codeine as well as returns to her familiar, home environment and begins to a [place] and rest better that I do expect some improvement. We talked about the effects of prolonged hospitalizations on weakness as well  as mental status. The daughter says that the patient is back to baseline mental status at this time. No complaints from the patient at this time.  Clinical Course     ____________________________________________   FINAL CLINICAL IMPRESSION(S) / ED DIAGNOSES  Mechanical fall. Hip contusion. Head injury.    NEW MEDICATIONS STARTED DURING THIS VISIT:  New Prescriptions   No medications on file     Note:  This document was prepared using Dragon voice recognition software and may include unintentional dictation errors.    Orbie Pyo, MD 12/08/15 2204  Interpreter, Sherre Scarlet, present during patient interview.    Orbie Pyo, MD 12/08/15 2206

## 2015-12-08 NOTE — Progress Notes (Signed)
[facility]  ROUNDING NOTE   Subjective:   Seen and examined on hemdialysis.  Tolerating treatment well.   Objective:  Vital signs in last 24 hours:  Temp:  [97.8 F (36.6 C)-98.4 F (36.9 C)] 98.4 F (36.9 C) (10/02 1020) Pulse Rate:  [93-104] 100 (10/02 1315) Resp:  [16-20] 16 (10/02 1315) BP: (97-119)/(44-55) 105/48 (10/02 1315) SpO2:  [96 %-98 %] 98 % (10/02 1020) Weight:  [47.8 kg (105 lb 4.8 oz)-48.5 kg (106 lb 14.8 oz)] 48.5 kg (106 lb 14.8 oz) (10/02 1020)  Weight change:  Filed Weights   12/05/15 1839 12/08/15 0616 12/08/15 1020  Weight: 46.9 kg (103 lb 8 oz) 47.8 kg (105 lb 4.8 oz) 48.5 kg (106 lb 14.8 oz)    Intake/Output: I/O last 3 completed shifts: In: 63 [P.O.:60] Out: 250 [Urine:250]   Intake/Output this shift:  Total I/O In: 0  Out: 200 [Urine:200]  Physical Exam: General: No acute distress  Head: Normocephalic, atraumatic. Moist oral mucosal membranes  Eyes: Anicteric  Neck: Supple, trachea midline  Lungs:  Mild bilateral rales, normal effort  Heart: S1S2 no rubs  Abdomen:  Soft, non tender, BS present  Extremities: no peripheral edema.  Neurologic: Nonfocal, moving all four extremities  Skin: No lesions  Access: LIJ permcath 9/13 Dr. Lucky Cowboy, rt IJ port    Basic Metabolic Panel:  Recent Labs Lab 12/01/15 2042 12/02/15 0416 12/03/15 0550 12/05/15 0521  NA 139 139 136  --   K 3.3* 3.3* 3.5  --   CL 98* 102 102  --   CO2 27 27 20*  --   GLUCOSE 176* 146* 160*  --   BUN 33* 40* 52*  --   CREATININE 3.01* 3.68* 4.61*  --   CALCIUM 8.7* 7.8* 8.2*  --   PHOS  --   --  4.9* 4.4    Liver Function Tests:  Recent Labs Lab 12/01/15 2042  AST 142*  ALT 48  ALKPHOS 104  BILITOT 1.4*  PROT 7.6  ALBUMIN 3.2*   No results for input(s): LIPASE, AMYLASE in the last 168 hours. No results for input(s): AMMONIA in the last 168 hours.  CBC:  Recent Labs Lab 12/01/15 2042 12/02/15 0416 12/02/15 1909 12/03/15 0550  12/05/15 0520  WBC 5.5 3.6  --  4.0 6.3  NEUTROABS 4.5  --   --   --   --   HGB 7.9* 6.5* 8.9* 8.7* 8.9*  HCT 22.9* 19.3* 26.3* 25.5* 26.1*  MCV 88.2 89.6  --  87.7 88.4  PLT 126* 98*  --  106* 113*    Cardiac Enzymes: No results for input(s): CKTOTAL, CKMB, CKMBINDEX, TROPONINI in the last 168 hours.  BNP: Invalid input(s): POCBNP  CBG:  Recent Labs Lab 12/07/15 0742 12/07/15 1126 12/07/15 1635 12/07/15 2118 12/08/15 0757  GLUCAP 143* 170* 144* 160* 131*    Microbiology: Results for orders placed or performed during the hospital encounter of 12/01/15  Blood Culture (routine x 2)     Status: None   Collection Time: 12/01/15  8:42 PM  Result Value Ref Range Status   Specimen Description BLOOD LEFT HAND  Final   Special Requests BOTTLES DRAWN AEROBIC AND ANAEROBIC 5ML  Final   Culture NO GROWTH 5 DAYS  Final   Report Status 12/06/2015 FINAL  Final  Blood Culture (routine x 2)     Status: None   Collection Time: 12/01/15  8:42 PM  Result Value Ref Range Status   Specimen  Description BLOOD PORTA CATH  Final   Special Requests BOTTLES DRAWN AEROBIC AND ANAEROBIC 7ML  Final   Culture NO GROWTH 5 DAYS  Final   Report Status 12/06/2015 FINAL  Final  Urine culture     Status: Abnormal   Collection Time: 12/01/15  8:42 PM  Result Value Ref Range Status   Specimen Description URINE, RANDOM  Final   Special Requests NONE  Final   Culture (A)  Final    <10,000 COLONIES/mL INSIGNIFICANT GROWTH Performed at [facility]    Report Status 12/03/2015 FINAL  Final  Acid Fast Smear (AFB)     Status: None   Collection Time: 12/04/15 11:55 AM  Result Value Ref Range Status   AFB Specimen Processing Concentration  Final   Acid Fast Smear Negative  Final    Comment: (NOTE) Performed At: [facility] [place], [place] [id number] Lindon Romp MD UO:[id number]    Source (AFB) BRONCHIAL ALVEOLAR LAVAGE  Final  Hsv Culture And Typing     Status:  None   Collection Time: 12/04/15 11:55 AM  Result Value Ref Range Status   HSV Culture/Type Comment  Final    Comment: (NOTE) Negative No Herpes simplex virus isolated. Performed At: [facility] [address] [place], [place] [id number] Lindon Romp MD HV:[id number]    Source of Sample BRONCHIAL ALVEOLAR LAVAGE  Final  Culture, fungus without smear ([facility]-Only)     Status: None (Preliminary result)   Collection Time: 12/04/15 11:55 AM  Result Value Ref Range Status   Specimen Description BRONCHIAL ALVEOLAR LAVAGE  Final   Special Requests Immunocompromised  Final   Culture   Final    NO FUNGUS ISOLATED AFTER 3 DAYS Performed at [facility]    Report Status PENDING  Incomplete  Culture, bal-quantitative     Status: None   Collection Time: 12/04/15 11:55 AM  Result Value Ref Range Status   Specimen Description BRONCHIAL ALVEOLAR LAVAGE  Final   Special Requests Immunocompromised  Final   Gram Stain   Final    FEW WBC PRESENT,BOTH PMN AND MONONUCLEAR NO ORGANISMS SEEN    Culture   Final    NO GROWTH 2 DAYS Performed at [facility]    Report Status 12/06/2015 FINAL  Final    Coagulation Studies: No results for input(s): LABPROT, INR in the last 72 hours.  Urinalysis: No results for input(s): COLORURINE, LABSPEC, PHURINE, GLUCOSEU, HGBUR, BILIRUBINUR, KETONESUR, PROTEINUR, UROBILINOGEN, NITRITE, LEUKOCYTESUR in the last 72 hours.  Invalid input(s): APPERANCEUR    Imaging: US Transvaginal Non-ob  Result Date: 12/06/2015 CLINICAL DATA:  Postmenopausal bleeding. EXAM: TRANSABDOMINAL AND TRANSVAGINAL ULTRASOUND OF PELVIS TECHNIQUE: Both transabdominal and transvaginal ultrasound examinations of the pelvis were performed. Transabdominal technique was performed for global imaging of the pelvis including uterus, ovaries, adnexal regions, and pelvic cul-de-sac. It was necessary to proceed with endovaginal exam following the transabdominal exam to  visualize the endometrium and ovaries. COMPARISON:  None FINDINGS: Uterus Measurements: 7.1 x 3.1 x 3.8 cm. There is a 6 x 3 x 7 mm fibroid in the mid uterus. Endometrium Thickness: 5.6 mm on endovaginal imaging. No focal abnormality visualized. Right ovary Not visualized. Left ovary Not visualized. Other findings There is a 13 mm nabothian cyst.  No free fluid in the pelvis. IMPRESSION: 1. No focal endometrial mass is identified. However, the endometrial stripe thickness was measured at 5.6 mm. Normal is less than 5 mm. Recommend gynecologic consultation and  tissue sampling given the history of postmenopausal bleeding and an endometrial stripe thickness of greater than 5 mm. These results will be called to the ordering clinician or representative by the Radiologist Assistant, and communication documented in the PACS or zVision Dashboard. Electronically Signed   By: Dorise Bullion III M.D   On: 12/06/2015 14:05   US Pelvis Complete  Result Date: 12/06/2015 CLINICAL DATA:  Postmenopausal bleeding. EXAM: TRANSABDOMINAL AND TRANSVAGINAL ULTRASOUND OF PELVIS TECHNIQUE: Both transabdominal and transvaginal ultrasound examinations of the pelvis were performed. Transabdominal technique was performed for global imaging of the pelvis including uterus, ovaries, adnexal regions, and pelvic cul-de-sac. It was necessary to proceed with endovaginal exam following the transabdominal exam to visualize the endometrium and ovaries. COMPARISON:  None FINDINGS: Uterus Measurements: 7.1 x 3.1 x 3.8 cm. There is a 6 x 3 x 7 mm fibroid in the mid uterus. Endometrium Thickness: 5.6 mm on endovaginal imaging. No focal abnormality visualized. Right ovary Not visualized. Left ovary Not visualized. Other findings There is a 13 mm nabothian cyst.  No free fluid in the pelvis. IMPRESSION: 1. No focal endometrial mass is identified. However, the endometrial stripe thickness was measured at 5.6 mm. Normal is less than 5 mm. Recommend  gynecologic consultation and tissue sampling given the history of postmenopausal bleeding and an endometrial stripe thickness of greater than 5 mm. These results will be called to the ordering clinician or representative by the Radiologist Assistant, and communication documented in the PACS or zVision Dashboard. Electronically Signed   By: Dorise Bullion III M.D   On: 12/06/2015 14:05     Medications:     . butamben-tetracaine-benzocaine  1 spray Topical Once  . chlorpheniramine-HYDROcodone  5 mL Oral Q12H  . epoetin (EPOGEN/PROCRIT) injection  10,000 Units Intravenous Q M,W,F-HD  . feeding supplement (ENSURE ENLIVE)  237 mL Oral BID BM  . ferrous sulfate  325 mg Oral Q breakfast  . furosemide  80 mg Oral Daily  . gabapentin  300 mg Oral TID  . heparin  5,000 Units Subcutaneous Q8H  . insulin aspart  0-5 Units Subcutaneous QHS  . insulin aspart  0-9 Units Subcutaneous TID WC  . [START ON 12/09/2015] levofloxacin  500 mg Oral Q48H  . lidocaine  1 application Topical Once   acetaminophen **OR** acetaminophen, benzonatate, guaiFENesin-codeine, ipratropium-albuterol, ipratropium-albuterol, ondansetron **OR** ondansetron (ZOFRAN) IV, oxyCODONE, phenylephrine  Assessment/ Plan:  50 y.o.hispanic female with diabetes mellitus type 2, hypertension, multiple myeloma on chemotherapy, osteoarthritis,   1. End Stage Renal Disease: progression of myeloma kidney. Nonoliguric - Continue MWF schedule. Seen and examined on hemodialysis. Tolerating treatment well.   2. 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 With thrombocytopenia. Hemoglobin 8.9 Oncology: Dr. Grayland Ormond -  epo with hemodialysis.   3. Multiple Myeloma:  Patient appears to have had progression of her underlying multiple myeloma.  She is being considered for further therapy after acute issues including infection resolved.  4.  Pneumonia, bacterial vs fungal.  J18.9.  Patient found to have atypical pattern of pneumonitis on chest  CT.  She has underlying multiple myeloma and can be considered as being immunosuppressed.  Status post bronchoscopy 12/04/15 - Patient improved significantly with antibiotic therapy. Her cough has also improved significantly. Infectious disease following as well.  BAL bacterial and fungal cultures thus far negative.  5. Secondary Hyperparathyroidism: not currently on binders. Calcium and phosphorus are at goal.      LOS: 7 Alexxa Sabet 10/2/20171:32 PM

## 2015-12-08 NOTE — ED Notes (Signed)
Pt transported to xray via stretcher

## 2015-12-08 NOTE — Progress Notes (Signed)
Patient discharged to home. Interpreter used. Port deaccessed. Coupon given for antibiotic. Concerns addressed. Patient educated to continue with MWF schedule for dialysis.

## 2015-12-16 LAB — VIRUS CULTURE

## 2015-12-21 NOTE — Discharge Summary (Signed)
[facility] [place] NAME: Jamie Keller    MR#:  [id number]  DATE OF BIRTH:  29-Jun-1965  DATE OF ADMISSION:  12/01/2015    ADMITTING PHYSICIAN: Lance Coon, MD  DATE OF DISCHARGE: 12/08/15  PRIMARY CARE PHYSICIAN: [name], MD    ADMISSION DIAGNOSIS:  Sepsis, due to unspecified organism ([place]) [A41.9]  DISCHARGE DIAGNOSIS:  Acute respiratory failure due to atypical pneumonia Sepsis -resolved MM on Velcade ESRD on HD Vaginal bleeding resolved-f/u gyn as outpt  SECONDARY DIAGNOSIS:       Past Medical History:  Diagnosis Date  . Diabetes mellitus without complication ([place])   . Hypertension   . Multiple myeloma ([place])   . Osteoarthritis   . Post-menopausal 2014    HOSPITAL COURSE:  50 year old with multiple myeloma presenting with sepsis and pneumonia #1 Sepsis ([place]) - Due to pneumonia CT scan shows atypical pneumoniaAppreciate infectious disease input status post bronchoscopy on 12/04/15 further therapy based on findings of her him bronc results and cultures. TB is being ruled out. AFB smear negativ. AFB cultures pending -was on IV vanc, levaquin and cefepime---since BAL is negative per ID rec d/c vanc and cefepime -complted course with levaquin 3 more doses to go  #2 multiple myeloma with worsening anemia status post transfusion Hemoglobin stable  #3End stage renal failure on dialysis [facility]) - nephrology following for dialysis  #4 HTN (hypertension) - stable, continue home meds  #5 Depression - continue home meds  #6 Vaginal bleeding resolved US pelvis shows mild endometrial wall thickening. Pt will f/u dr Glennon Mac gyn as out pt  Pt will d/c to home after HD today She will f/u Dr Ola Spurr in 2 weeks  CONSULTS OBTAINED:  Treatment Team:  Anthonette Legato, MD Lloyd Huger, MD Leonel Ramsay, MD Will Bonnet, MD Fritzi Mandes, MD  DRUG ALLERGIES:     Allergies  Allergen Reactions  . No Known Allergies     DISCHARGE MEDICATIONS:      Current Discharge Medication List       START taking these medications   Details  benzonatate (TESSALON) 200 MG capsule Take 1 capsule (200 mg total) by mouth 3 (three) times daily as needed for cough. Qty: 20 capsule, Refills: 0    feeding supplement, ENSURE ENLIVE, (ENSURE ENLIVE) LIQD Take 237 mLs by mouth 2 (two) times daily between meals. Qty: 237 mL, Refills: 12    levofloxacin (LEVAQUIN) 500 MG tablet Take 1 tablet (500 mg total) by mouth every other day. Qty: 3 tablet, Refills: 0         CONTINUE these medications which have NOT CHANGED   Details  ferrous sulfate 325 (65 FE) MG tablet Take 1 tablet (325 mg total) by mouth daily with breakfast. Qty: 30 tablet, Refills: 0    furosemide (LASIX) 80 MG tablet Take 1 tablet (80 mg total) by mouth daily. Qty: 30 tablet, Refills: 0    gabapentin (NEURONTIN) 300 MG capsule Take 1 capsule (300 mg total) by mouth 3 (three) times daily. Qty: 90 capsule, Refills: 2    Oxycodone HCl 10 MG TABS Take 1 tablet (10 mg total) by mouth every 6 (six) hours as needed. Qty: 60 tablet, Refills: 0    verapamil (CALAN) 40 MG tablet Take 1 tablet (40 mg total) by mouth 2 (two) times daily. Qty: 60 tablet, Refills: 0        If you experience worsening of your admission symptoms, develop shortness  of breath, life threatening emergency, suicidal or homicidal thoughts you must seek medical attention immediately by calling 911 or calling your MD immediately  if symptoms less severe.  You Must read complete instructions/literature along with all the possible adverse reactions/side effects for all the Medicines you take and that have been prescribed to you. Take any new Medicines after you have completely understood and accept all the possible adverse reactions/side effects.   Please note  You were cared for by a hospitalist during your  hospital stay. If you have any questions about your discharge medications or the care you received while you were in the hospital after you are discharged, you can call the unit and asked to speak with the hospitalist on call if the hospitalist that took care of you is not available. Once you are discharged, your primary care physician will handle any further medical issues. Please note that NO REFILLS for any discharge medications will be authorized once you are discharged, as it is imperative that you return to your primary care physician (or establish a relationship with a primary care physician if you do not have one) for your aftercare needs so that they can reassess your need for medications and monitor your lab values. Today   SUBJECTIVE   Via interpreter C/o weakness  VITAL SIGNS:  Blood pressure (!) 113/51, pulse 96, temperature 98.1 F (36.7 C), temperature source Oral, resp. rate 20, height 5' 2"  (1.575 m), weight 47.8 kg (105 lb 4.8 oz), last menstrual period 11/20/1985, SpO2 96 %.  I/O:   Intake/Output Summary (Last 24 hours) at 12/08/15 0835 Last data filed at 12/08/15 0643  Gross per 24 hour  Intake                0 ml  Output              250 ml  Net             -250 ml    PHYSICAL EXAMINATION:  GENERAL:  50 y.o.-year-old patient lying in the bed with no acute distress.  EYES: Pupils equal, round, reactive to light and accommodation. No scleral icterus. Extraocular muscles intact.  HEENT: Head atraumatic, normocephalic. Oropharynx and nasopharynx clear.  NECK:  Supple, no jugular venous distention. No thyroid enlargement, no tenderness.  LUNGS: coarse breath sounds bilaterally, no wheezing, rales,rhonchi or crepitation. No use of accessory muscles of respiration.  CARDIOVASCULAR: S1, S2 normal. No murmurs, rubs, or gallops.  ABDOMEN: Soft, non-tender, non-distended. Bowel sounds present. No organomegaly or mass.  EXTREMITIES: No pedal edema, cyanosis, or  clubbing.  NEUROLOGIC: Cranial nerves II through XII are intact. Muscle strength 5/5 in all extremities. Sensation intact. Gait not checked.  PSYCHIATRIC: The patient is alert and oriented x 3.  SKIN: No obvious rash, lesion, or ulcer.   DATA REVIEW:   CBC   Last Labs    Recent Labs Lab 12/05/15 0520  WBC 6.3  HGB 8.9*  HCT 26.1*  PLT 113*      Chemistries   Last Labs    Recent Labs Lab 12/01/15 2042  12/03/15 0550  NA 139  < > 136  K 3.3*  < > 3.5  CL 98*  < > 102  CO2 27  < > 20*  GLUCOSE 176*  < > 160*  BUN 33*  < > 52*  CREATININE 3.01*  < > 4.61*  CALCIUM 8.7*  < > 8.2*  AST 142*  --   --  ALT 48  --   --   ALKPHOS 104  --   --   BILITOT 1.4*  --   --   < > = values in this interval not displayed.    Microbiology Results          Recent Results (from the past 240 hour(s))  Blood Culture (routine x 2)     Status: None   Collection Time: 12/01/15  8:42 PM  Result Value Ref Range Status   Specimen Description BLOOD LEFT HAND  Final   Special Requests BOTTLES DRAWN AEROBIC AND ANAEROBIC 5ML  Final   Culture NO GROWTH 5 DAYS  Final   Report Status 12/06/2015 FINAL  Final  Blood Culture (routine x 2)     Status: None   Collection Time: 12/01/15  8:42 PM  Result Value Ref Range Status   Specimen Description BLOOD PORTA CATH  Final   Special Requests BOTTLES DRAWN AEROBIC AND ANAEROBIC 7ML  Final   Culture NO GROWTH 5 DAYS  Final   Report Status 12/06/2015 FINAL  Final  Urine culture     Status: Abnormal   Collection Time: 12/01/15  8:42 PM  Result Value Ref Range Status   Specimen Description URINE, RANDOM  Final   Special Requests NONE  Final   Culture (A)  Final    <10,000 COLONIES/mL INSIGNIFICANT GROWTH Performed at [facility]   Report Status 12/03/2015 FINAL  Final  Acid Fast Smear (AFB)     Status: None   Collection Time: 12/04/15 11:55 AM  Result Value Ref Range Status   AFB Specimen  Processing Concentration  Final   Acid Fast Smear Negative  Final    Comment: (NOTE) Performed At: [facility] [place], [place] [id number] Lindon Romp MD HE:[id number]   Source (AFB) BRONCHIAL ALVEOLAR LAVAGE  Final  Hsv Culture And Typing     Status: None   Collection Time: 12/04/15 11:55 AM  Result Value Ref Range Status   HSV Culture/Type Comment  Final    Comment: (NOTE) Negative No Herpes simplex virus isolated. Performed At: [facility] [address] [place], [place] [id number] Lindon Romp MD YO:[id number]   Source of Sample BRONCHIAL ALVEOLAR LAVAGE  Final  Culture, fungus without smear ([facility]-Only)     Status: None (Preliminary result)   Collection Time: 12/04/15 11:55 AM  Result Value Ref Range Status   Specimen Description BRONCHIAL ALVEOLAR LAVAGE  Final   Special Requests Immunocompromised  Final   Culture   Final    NO FUNGUS ISOLATED AFTER 2 DAYS Performed at [facility]   Report Status PENDING  Incomplete  Culture, bal-quantitative     Status: None   Collection Time: 12/04/15 11:55 AM  Result Value Ref Range Status   Specimen Description BRONCHIAL ALVEOLAR LAVAGE  Final   Special Requests Immunocompromised  Final   Gram Stain   Final    FEW WBC PRESENT,BOTH PMN AND MONONUCLEAR NO ORGANISMS SEEN   Culture   Final    NO GROWTH 2 DAYS Performed at [facility]   Report Status 12/06/2015 FINAL  Final    RADIOLOGY:   Imaging Results (Last 48 hours)  US Transvaginal Non-ob  Result Date: 12/06/2015 CLINICAL DATA:  Postmenopausal bleeding. EXAM: TRANSABDOMINAL AND TRANSVAGINAL ULTRASOUND OF PELVIS TECHNIQUE: Both transabdominal and transvaginal ultrasound examinations of the pelvis were performed. Transabdominal technique was performed for global imaging of the pelvis including uterus, ovaries, adnexal regions, and  pelvic cul-de-sac. It was necessary to proceed  with endovaginal exam following the transabdominal exam to visualize the endometrium and ovaries. COMPARISON:  None FINDINGS: Uterus Measurements: 7.1 x 3.1 x 3.8 cm. There is a 6 x 3 x 7 mm fibroid in the mid uterus. Endometrium Thickness: 5.6 mm on endovaginal imaging. No focal abnormality visualized. Right ovary Not visualized. Left ovary Not visualized. Other findings There is a 13 mm nabothian cyst.  No free fluid in the pelvis. IMPRESSION: 1. No focal endometrial mass is identified. However, the endometrial stripe thickness was measured at 5.6 mm. Normal is less than 5 mm. Recommend gynecologic consultation and tissue sampling given the history of postmenopausal bleeding and an endometrial stripe thickness of greater than 5 mm. These results will be called to the ordering clinician or representative by the Radiologist Assistant, and communication documented in the PACS or zVision Dashboard. Electronically Signed   By: Dorise Bullion III M.D   On: 12/06/2015 14:05   US Pelvis Complete  Result Date: 12/06/2015 CLINICAL DATA:  Postmenopausal bleeding. EXAM: TRANSABDOMINAL AND TRANSVAGINAL ULTRASOUND OF PELVIS TECHNIQUE: Both transabdominal and transvaginal ultrasound examinations of the pelvis were performed. Transabdominal technique was performed for global imaging of the pelvis including uterus, ovaries, adnexal regions, and pelvic cul-de-sac. It was necessary to proceed with endovaginal exam following the transabdominal exam to visualize the endometrium and ovaries. COMPARISON:  None FINDINGS: Uterus Measurements: 7.1 x 3.1 x 3.8 cm. There is a 6 x 3 x 7 mm fibroid in the mid uterus. Endometrium Thickness: 5.6 mm on endovaginal imaging. No focal abnormality visualized. Right ovary Not visualized. Left ovary Not visualized. Other findings There is a 13 mm nabothian cyst.  No free fluid in the pelvis. IMPRESSION: 1. No focal endometrial mass is identified. However, the endometrial stripe thickness was  measured at 5.6 mm. Normal is less than 5 mm. Recommend gynecologic consultation and tissue sampling given the history of postmenopausal bleeding and an endometrial stripe thickness of greater than 5 mm. These results will be called to the ordering clinician or representative by the Radiologist Assistant, and communication documented in the PACS or zVision Dashboard. Electronically Signed   By: Dorise Bullion III M.D   On: 12/06/2015 14:05      Management plans discussed with the patient, family and they are in agreement.  CODE STATUS:          Code Status Orders             Start     Ordered   12/02/15 0111  Full code  Continuous     12/02/15 0110                      Code Status History    Date Active Date Inactive Code Status Order ID Comments User Context   11/14/2015 12:52 AM 11/17/2015  1:12 PM Full Code [id number]  Holley Raring, NP ED   11/14/2015 12:06 AM 11/14/2015 12:12 AM Full Code [id number]  Holley Raring, NP ED      TOTAL TIME TAKING CARE OF THIS PATIENT:40 minutes.    Yazmin Locher M.D on 12/08/2015 at 8:35 AM  Between 7am to 6pm - Pager - [phone] After 6pm go to [email or web address] - password [email or web address] [facility]  Office  [phone]  CC: Primary care physician; [name], MD       Revision History

## 2015-12-24 NOTE — Progress Notes (Signed)
[place]  Telephone:([phone]) [phone] Fax:(336) [phone]  ID: Jamie Keller OB: [birth year]  MR#: [id number]  RJJ#:[id number]  [name], MD as PCP - General (Family Medicine)  CHIEF COMPLAINT: Multiple Myeloma in relapse with multiple bony lytic lesions, now with end-stage renal disease on dialysis.   INTERVAL HISTORY: Patient returns to clinic today for further evaluation and treatment planning. She has had multiple hospital admissions over the past several months secondary to end-stage renal disease requiring dialysis and sepsis. She feels significantly weak and fatigued. She has a poor appetite. She has no neurologic complaints. She denies any further fevers. She does not complain of any further nausea or vomiting. She denies any chest pain or shortness of breath.  She denies any constipation or diarrhea. She has no urinary complaints. Patient offers no further specific complaints today.  REVIEW OF SYSTEMS:   Review of Systems  Constitutional: Positive for malaise/fatigue and weight loss. Negative for fever.  Respiratory: Negative.  Negative for cough and shortness of breath.   Cardiovascular: Negative.  Negative for chest pain.  Gastrointestinal: Negative.  Negative for abdominal pain, nausea and vomiting.  Musculoskeletal: Positive for back pain.  Neurological: Positive for weakness. Negative for tingling and focal weakness.  Psychiatric/Behavioral: Positive for depression. The patient is nervous/anxious.     As per HPI. Otherwise, a complete review of systems is negative.  PAST MEDICAL HISTORY: Past Medical History:  Diagnosis Date  . Diabetes mellitus without complication ([place])   . Hypertension   . Multiple myeloma ([place])   . Osteoarthritis   . Pneumonia   . Post-menopausal 2014  . Sepsis ([place])     PAST SURGICAL HISTORY: Past Surgical History:  Procedure Laterality Date  . CESAREAN SECTION     x2  . PERIPHERAL  VASCULAR CATHETERIZATION N/A 11/19/2015   Procedure: Dialysis/Perma Catheter Insertion;  Surgeon: Algernon Huxley, MD;  Location: [facility];  Service: Cardiovascular;  Laterality: N/A;    FAMILY HISTORY: Reviewed and unchanged. No reported history of malignancy or chronic disease.     ADVANCED DIRECTIVES:    HEALTH MAINTENANCE: Social History  Substance Use Topics  . Smoking status: Never Smoker  . Smokeless tobacco: Never Used  . Alcohol use No     Allergies  Allergen Reactions  . No Known Allergies     Current Outpatient Prescriptions  Medication Sig Dispense Refill  . furosemide (LASIX) 80 MG tablet Take 1 tablet (80 mg total) by mouth daily. 30 tablet 0  . Oxycodone HCl 10 MG TABS Take 1 tablet (10 mg total) by mouth every 6 (six) hours as needed. (Patient taking differently: Take 10 mg by mouth every 6 (six) hours as needed (pain). ) 60 tablet 0  . verapamil (CALAN) 40 MG tablet Take 1 tablet (40 mg total) by mouth 2 (two) times daily. 60 tablet 0  . benzonatate (TESSALON) 200 MG capsule Take 1 capsule (200 mg total) by mouth 3 (three) times daily as needed for cough. 30 capsule 2  . ferrous sulfate 325 (65 FE) MG tablet Take 325 mg by mouth daily with breakfast.    . gabapentin (NEURONTIN) 100 MG capsule Take 100 mg by mouth 3 (three) times daily.    . promethazine (PHENERGAN) 25 MG tablet Take 25 mg by mouth every 6 (six) hours as needed for nausea or vomiting.     No current facility-administered medications for this visit.     OBJECTIVE: Vitals:   12/25/15  1127  BP: 136/70  Pulse: (!) 118  Resp: 18  Temp: 98.6 F (37 C)     Body mass index is 20.43 kg/m.    ECOG FS:1 - Symptomatic but completely ambulatory  General: Well-developed, well-nourished, no acute distress. Eyes: anicteric sclera. Lungs: Clear to auscultation bilaterally. Heart: Regular rate and rhythm. No rubs, murmurs, or gallops. Abdomen: Soft, nontender, nondistended. No organomegaly  noted, normoactive bowel sounds. Musculoskeletal: No edema, cyanosis, or clubbing. Neuro: Alert, answering all questions appropriately. Cranial nerves grossly intact. Skin: No rashes or petechiae noted. Psych: Normal affect.   LAB RESULTS:  Lab Results  Component Value Date   NA 135 12/30/2015   K 3.8 12/30/2015   CL 96 (L) 12/30/2015   CO2 25 12/30/2015   GLUCOSE 180 (H) 12/30/2015   BUN 37 (H) 12/30/2015   CREATININE 3.92 (H) 12/30/2015   CALCIUM 8.7 (L) 12/30/2015   PROT 6.9 12/30/2015   ALBUMIN 2.9 (L) 12/30/2015   AST 41 12/30/2015   ALT 11 (L) 12/30/2015   ALKPHOS 131 (H) 12/30/2015   BILITOT 1.2 12/30/2015   GFRNONAA 12 (L) 12/30/2015   GFRAA 14 (L) 12/30/2015    Lab Results  Component Value Date   WBC 5.5 12/30/2015   NEUTROABS 4.0 12/30/2015   HGB 5.9 (L) 12/30/2015   HCT 17.4 (L) 12/30/2015   MCV 93.0 12/30/2015   PLT 120 (L) 12/30/2015   Lab Results  Component Value Date   TOTALPROTELP 6.5 12/30/2015   ALBUMINELP 3.0 12/30/2015   A1GS 0.3 12/30/2015   A2GS 1.1 (H) 12/30/2015   BETS 1.3 12/30/2015   GAMS 0.8 12/30/2015   MSPIKE 0.6 (H) 12/30/2015   SPEI Comment 12/30/2015     STUDIES: Ct Abdomen Pelvis Wo Contrast  Result Date: 01/01/2016 CLINICAL DATA:  Right flank pain for 1 week. No hematuria. History multiple myeloma. EXAM: CT ABDOMEN AND PELVIS WITHOUT CONTRAST TECHNIQUE: Multidetector CT imaging of the abdomen and pelvis was performed following the standard protocol without IV contrast. COMPARISON:  11/14/2015 CT FINDINGS: Lower chest: Bilateral small pleural effusions with compressive atelectasis are identified stable on the right and increased on the left in the interval. Small right lower lobe calcified granuloma on series 2, image 13. Hepatobiliary: The unenhanced liver is unremarkable. Contracted appearing gallbladder which is slightly thickened in appearance as a result. There is a trace amount of free fluid/ascites overlying the right  hepatic lobe. Pancreas: Slight decrease in peripancreatic inflammation since prior. Stranding along Gerota's fascia is present. No pseudocyst formation. No ductal dilatation. Spleen: Normal size spleen. Adrenals/Urinary Tract: The adrenal glands are normal. Mild nonspecific perinephric fat stranding is noted bilaterally. There is mild fullness of both renal collecting systems without obstructing calculi. There is no nephrolithiasis. No hydroureter. Findings may be related to chronic ectasia of the renal collecting systems bilaterally. Stomach/Bowel: Normal bowel rotation. No bowel obstruction. Moderate colonic stool burden. Vascular/Lymphatic: Minimal atherosclerosis of the normal-sized abdominal aorta and right internal iliac arteries. Small phleboliths are seen bilaterally in the lower pelvis. Reproductive: There may be a small nabothian cyst or fibroid in the lower uterine segment series 2, image 79 accounting for hypodensity measuring 6 mm. Otherwise negative. Other: Moderate amount of free fluid in the pelvis increased since the interim. Musculoskeletal: Mixed ostia sclerotic and osteolytic abnormality is along the visualized dorsal spine, pelvis and sternum consistent with known history multiple myeloma. Stable T11 compression. IMPRESSION: 1. Mildly dilated renal collecting systems bilaterally without obstructing calculi nor hydroureter. Findings may be due  to chronic ectasia of the renal collecting system. 2. Slight interval increase in left pleural effusion with stable right small pleural effusion. 3. Slight decrease in peripancreatic inflammation. The increase in free fluid within the abdomen and pelvis may be sequela of previously described pancreatitis. 4. Diffuse sclerosis in numerous lytic lesions throughout the visualized osseous elements with chronic height loss of T11. This reflects the patient's known multiple myeloma. Electronically Signed   By: Ashley Royalty M.D.   On: 01/01/2016 14:46   Dg Chest  1 View  Result Date: 12/04/2015 CLINICAL DATA:  [name] with a history of Multiple Myeloma, she was admitted to the hospital earlier this month with acute renal failure and has been on HD since that time. She has been on velcade. History is obtained via a Patent attorney, she presented to the hospital with intractable cough and chest congestion as well as persistent fever. She was admitted ton 9/27 with possible pneumonia EXAM: CHEST 1 VIEW COMPARISON:  Chest CT, 12/02/2015.  Chest radiograph, 11/11/2015. FINDINGS: Cardiac silhouette is mildly enlarged. No mediastinal or hilar masses. No convincing adenopathy. Tunneled dual lumen central venous catheter on the left has its catheter tip is in the right atrium. Right internal jugular Port-A-Cath has its tip at the caval atrial junction. There is prominence of the bronchovascular markings with hazy ground-glass type opacity in the perihilar and lower lungs similar to the prior exam. No new lung abnormalities. No convincing pleural effusion. No pneumothorax. Skeletal structures are unremarkable. IMPRESSION: 1. Mild hazy perihilar to lower lung zone opacity as well as prominence of the bronchovascular markings. This is similar to the prior chest radiograph. This could reflect atypical infection as suggested on the chest CT. Mild residual edema is possible. There are no new abnormalities. Electronically Signed   By: Lajean Manes M.D.   On: 12/04/2015 11:16   Ct Head Wo Contrast  Result Date: 12/08/2015 CLINICAL DATA:  [place] on concrete today, striking the head. Bump on the right side. EXAM: CT HEAD WITHOUT CONTRAST TECHNIQUE: Contiguous axial images were obtained from the base of the skull through the vertex without intravenous contrast. COMPARISON:  None. FINDINGS: Brain: No evidence of acute infarction, hemorrhage, hydrocephalus, extra-axial collection or mass lesion/mass effect. Vascular: No hyperdense vessel or unexpected calcification.  Skull: Normal. Negative for fracture or focal lesion. Sinuses/Orbits: Mucosal thickening throughout the paranasal sinuses. Mastoid air cells are not opacified. Other: Small subcutaneous scalp hematoma over the right parietal region. IMPRESSION: No acute intracranial abnormalities. Electronically Signed   By: Lucienne Capers M.D.   On: 12/08/2015 21:31   US Transvaginal Non-ob  Result Date: 12/06/2015 CLINICAL DATA:  Postmenopausal bleeding. EXAM: TRANSABDOMINAL AND TRANSVAGINAL ULTRASOUND OF PELVIS TECHNIQUE: Both transabdominal and transvaginal ultrasound examinations of the pelvis were performed. Transabdominal technique was performed for global imaging of the pelvis including uterus, ovaries, adnexal regions, and pelvic cul-de-sac. It was necessary to proceed with endovaginal exam following the transabdominal exam to visualize the endometrium and ovaries. COMPARISON:  None FINDINGS: Uterus Measurements: 7.1 x 3.1 x 3.8 cm. There is a 6 x 3 x 7 mm fibroid in the mid uterus. Endometrium Thickness: 5.6 mm on endovaginal imaging. No focal abnormality visualized. Right ovary Not visualized. Left ovary Not visualized. Other findings There is a 13 mm nabothian cyst.  No free fluid in the pelvis. IMPRESSION: 1. No focal endometrial mass is identified. However, the endometrial stripe thickness was measured at 5.6 mm. Normal is less than 5  mm. Recommend gynecologic consultation and tissue sampling given the history of postmenopausal bleeding and an endometrial stripe thickness of greater than 5 mm. These results will be called to the ordering clinician or representative by the Radiologist Assistant, and communication documented in the PACS or zVision Dashboard. Electronically Signed   By: Dorise Bullion III M.D   On: 12/06/2015 14:05   US Pelvis Complete  Result Date: 12/06/2015 CLINICAL DATA:  Postmenopausal bleeding. EXAM: TRANSABDOMINAL AND TRANSVAGINAL ULTRASOUND OF PELVIS TECHNIQUE: Both transabdominal and  transvaginal ultrasound examinations of the pelvis were performed. Transabdominal technique was performed for global imaging of the pelvis including uterus, ovaries, adnexal regions, and pelvic cul-de-sac. It was necessary to proceed with endovaginal exam following the transabdominal exam to visualize the endometrium and ovaries. COMPARISON:  None FINDINGS: Uterus Measurements: 7.1 x 3.1 x 3.8 cm. There is a 6 x 3 x 7 mm fibroid in the mid uterus. Endometrium Thickness: 5.6 mm on endovaginal imaging. No focal abnormality visualized. Right ovary Not visualized. Left ovary Not visualized. Other findings There is a 13 mm nabothian cyst.  No free fluid in the pelvis. IMPRESSION: 1. No focal endometrial mass is identified. However, the endometrial stripe thickness was measured at 5.6 mm. Normal is less than 5 mm. Recommend gynecologic consultation and tissue sampling given the history of postmenopausal bleeding and an endometrial stripe thickness of greater than 5 mm. These results will be called to the ordering clinician or representative by the Radiologist Assistant, and communication documented in the PACS or zVision Dashboard. Electronically Signed   By: Dorise Bullion III M.D   On: 12/06/2015 14:05   Dg Hip Unilat W Or Wo Pelvis 2-3 Views Right  Result Date: 12/08/2015 CLINICAL DATA:  [place] on concrete today, striking head. Right hip pain. EXAM: DG HIP (WITH OR WITHOUT PELVIS) 2-3V RIGHT COMPARISON:  None. FINDINGS: Pelvis appears intact. SI joints and symphysis pubis are not displaced. Degenerative changes in the lower lumbar spine and hips. No evidence of acute fracture or dislocation in the pelvis or the right hip. There are focal rounded areas of sclerosis with central lucency demonstrated in the right femoral intratrochanteric region. This appearance is nonspecific in could be due to osteoporosis. However, focal bone lesions are not excluded. Suggest bone scan for further evaluation. IMPRESSION: No acute  bony abnormalities. Degenerative changes in the hips. Nonspecific areas of sclerosis and lucency in the inter trochanteric region of the right hip. Osteoporosis versus focal bone lesions. Suggest bone scan for further evaluation. Electronically Signed   By: Lucienne Capers M.D.   On: 12/08/2015 21:27    ASSESSMENT: Multiple Myeloma in relapse with multiple bony lytic lesions, end-stage renal disease.   PLAN:    1. Multiple Myeloma in relapse with multiple bony lytic lesions: MRI, CT, bone scan results reviewed independently indicating progression of disease. Patient also has progressed end-stage renal disease, likely secondary to underlying myeloma. Patient's performance status has significantly declined, but she wishes to continue with aggressive treatment. Return to clinic on December 30, 2015 to initiate cycle 1, day 1 of Kyporlis. If tolerated, patient will receive treatment on days 1, 2, 8, 9, 15, and 16. Kyporlis does not need to be dose reduced in the setting of end-stage renal disease and dialysis. Patient will also require Zometa on a monthly basis.Marland Kitchen Dexamethasone has been discontinued given her difficult to control blood sugar. Because of patient's immigration status, she could not undergo transplant.  2. Back pain:  Continue current narcotics as prescribed.  3. End-stage renal disease: Likely secondary to progression of disease. Continue dialysis as above. 4. Anemia: Patient will likely require blood transfusion and near future.  5. Hyperglycemia: Patient's blood glucose has improved since initiating insulin. Continue monitoring and treatment by primary care.   The entire visit was done in the presence of an interpreter.  Patient expressed understanding and was in agreement with this plan. She also understands that She can call clinic at any time with any questions, concerns, or complaints.    Lloyd Huger, MD   01/02/2016 8:32 AM

## 2015-12-25 ENCOUNTER — Inpatient Hospital Stay: Payer: Self-pay | Attending: Oncology | Admitting: Oncology

## 2015-12-25 ENCOUNTER — Other Ambulatory Visit: Payer: Self-pay | Admitting: Nephrology

## 2015-12-25 ENCOUNTER — Other Ambulatory Visit: Payer: Self-pay

## 2015-12-25 ENCOUNTER — Inpatient Hospital Stay: Payer: Self-pay

## 2015-12-25 VITALS — BP 136/70 | HR 118 | Temp 98.6°F | Resp 18 | Wt 108.1 lb

## 2015-12-25 DIAGNOSIS — Z5111 Encounter for antineoplastic chemotherapy: Secondary | ICD-10-CM | POA: Insufficient documentation

## 2015-12-25 DIAGNOSIS — C9002 Multiple myeloma in relapse: Secondary | ICD-10-CM

## 2015-12-25 DIAGNOSIS — Z8701 Personal history of pneumonia (recurrent): Secondary | ICD-10-CM | POA: Insufficient documentation

## 2015-12-25 DIAGNOSIS — R109 Unspecified abdominal pain: Secondary | ICD-10-CM

## 2015-12-25 DIAGNOSIS — D649 Anemia, unspecified: Secondary | ICD-10-CM | POA: Insufficient documentation

## 2015-12-25 DIAGNOSIS — M549 Dorsalgia, unspecified: Secondary | ICD-10-CM | POA: Insufficient documentation

## 2015-12-25 DIAGNOSIS — R5383 Other fatigue: Secondary | ICD-10-CM | POA: Insufficient documentation

## 2015-12-25 DIAGNOSIS — E1165 Type 2 diabetes mellitus with hyperglycemia: Secondary | ICD-10-CM | POA: Insufficient documentation

## 2015-12-25 DIAGNOSIS — M1990 Unspecified osteoarthritis, unspecified site: Secondary | ICD-10-CM | POA: Insufficient documentation

## 2015-12-25 DIAGNOSIS — R531 Weakness: Secondary | ICD-10-CM | POA: Insufficient documentation

## 2015-12-25 DIAGNOSIS — Z79899 Other long term (current) drug therapy: Secondary | ICD-10-CM | POA: Insufficient documentation

## 2015-12-25 DIAGNOSIS — I129 Hypertensive chronic kidney disease with stage 1 through stage 4 chronic kidney disease, or unspecified chronic kidney disease: Secondary | ICD-10-CM | POA: Insufficient documentation

## 2015-12-25 DIAGNOSIS — J90 Pleural effusion, not elsewhere classified: Secondary | ICD-10-CM | POA: Insufficient documentation

## 2015-12-25 DIAGNOSIS — N186 End stage renal disease: Secondary | ICD-10-CM | POA: Insufficient documentation

## 2015-12-25 DIAGNOSIS — F329 Major depressive disorder, single episode, unspecified: Secondary | ICD-10-CM | POA: Insufficient documentation

## 2015-12-25 DIAGNOSIS — R630 Anorexia: Secondary | ICD-10-CM | POA: Insufficient documentation

## 2015-12-25 DIAGNOSIS — Z992 Dependence on renal dialysis: Secondary | ICD-10-CM | POA: Insufficient documentation

## 2015-12-25 DIAGNOSIS — F419 Anxiety disorder, unspecified: Secondary | ICD-10-CM | POA: Insufficient documentation

## 2015-12-25 DIAGNOSIS — N888 Other specified noninflammatory disorders of cervix uteri: Secondary | ICD-10-CM | POA: Insufficient documentation

## 2015-12-25 LAB — CBC WITH DIFFERENTIAL/PLATELET
BASOS ABS: 0.1 10*3/uL (ref 0–0.1)
BASOS PCT: 1 %
Eosinophils Absolute: 0 10*3/uL (ref 0–0.7)
Eosinophils Relative: 1 %
HEMATOCRIT: 17.5 % — AB (ref 35.0–47.0)
HEMOGLOBIN: 5.9 g/dL — AB (ref 12.0–16.0)
Lymphocytes Relative: 14 %
Lymphs Abs: 0.7 10*3/uL — ABNORMAL LOW (ref 1.0–3.6)
MCH: 31 pg (ref 26.0–34.0)
MCHC: 33.8 g/dL (ref 32.0–36.0)
MCV: 91.8 fL (ref 80.0–100.0)
MONOS PCT: 15 %
Monocytes Absolute: 0.7 10*3/uL (ref 0.2–0.9)
NEUTROS ABS: 3.2 10*3/uL (ref 1.4–6.5)
NEUTROS PCT: 69 %
Platelets: 104 10*3/uL — ABNORMAL LOW (ref 150–440)
RBC: 1.91 MIL/uL — AB (ref 3.80–5.20)
RDW: 19.5 % — ABNORMAL HIGH (ref 11.5–14.5)
WBC: 4.7 10*3/uL (ref 3.6–11.0)

## 2015-12-25 LAB — COMPREHENSIVE METABOLIC PANEL
ALBUMIN: 3 g/dL — AB (ref 3.5–5.0)
ALK PHOS: 100 U/L (ref 38–126)
ALT: 11 U/L — AB (ref 14–54)
AST: 47 U/L — AB (ref 15–41)
Anion gap: 13 (ref 5–15)
BILIRUBIN TOTAL: 0.9 mg/dL (ref 0.3–1.2)
BUN: 26 mg/dL — AB (ref 6–20)
CALCIUM: 8.8 mg/dL — AB (ref 8.9–10.3)
CO2: 27 mmol/L (ref 22–32)
Chloride: 93 mmol/L — ABNORMAL LOW (ref 101–111)
Creatinine, Ser: 3.57 mg/dL — ABNORMAL HIGH (ref 0.44–1.00)
GFR calc Af Amer: 16 mL/min — ABNORMAL LOW (ref >60)
GFR calc non Af Amer: 14 mL/min — ABNORMAL LOW (ref >60)
GLUCOSE: 178 mg/dL — AB (ref 65–99)
Potassium: 3.7 mmol/L (ref 3.5–5.1)
Sodium: 133 mmol/L — ABNORMAL LOW (ref 135–145)
TOTAL PROTEIN: 7.1 g/dL (ref 6.5–8.1)

## 2015-12-25 LAB — CULTURE, FUNGUS WITHOUT SMEAR

## 2015-12-25 NOTE — Progress Notes (Signed)
Complains of bone pain in right lower leg. Continues to have bilateral flank pain. Has occasional urine incontinence when has makes small amount of urine.

## 2015-12-26 DIAGNOSIS — D899 Disorder involving the immune mechanism, unspecified: Secondary | ICD-10-CM

## 2015-12-26 DIAGNOSIS — D849 Immunodeficiency, unspecified: Secondary | ICD-10-CM | POA: Insufficient documentation

## 2015-12-26 DIAGNOSIS — J189 Pneumonia, unspecified organism: Secondary | ICD-10-CM | POA: Insufficient documentation

## 2015-12-29 NOTE — Progress Notes (Signed)
[place]  [phone] [phone]  ID: Jamie Keller OB: Jul 10, 1965  MR#: [id number]  OTR#:[id number]  [name], MD as PCP - General (Family Medicine)  CHIEF COMPLAINT: Multiple Myeloma in relapse with multiple bony lytic lesions, now with end-stage renal disease on dialysis.   INTERVAL HISTORY: Patient returns to clinic today for further evaluation and initiation of Kyporlis. She continues to feel significantly weakness and fatigue with a declining performance status. She has a poor appetite. She has no neurologic complaints. She denies any further fevers. She does not complain of any nausea or vomiting. She denies any chest pain or shortness of breath.  She denies any constipation or diarrhea. She has no urinary complaints. Patient offers no further specific complaints today.  REVIEW OF SYSTEMS:   Review of Systems  Constitutional: Positive for malaise/fatigue and weight loss. Negative for fever.  Respiratory: Negative.  Negative for cough and shortness of breath.   Cardiovascular: Negative.  Negative for chest pain.  Gastrointestinal: Negative.  Negative for abdominal pain, nausea and vomiting.  Musculoskeletal: Positive for back pain.  Neurological: Positive for weakness. Negative for tingling and focal weakness.  Psychiatric/Behavioral: Positive for depression. The patient is nervous/anxious.    As per HPI. Otherwise, a complete review of systems is negative.   PAST MEDICAL HISTORY: Past Medical History:  Diagnosis Date  . Diabetes mellitus without complication ([place])   . Hypertension   . Multiple myeloma ([place])   . Osteoarthritis   . Pneumonia   . Post-menopausal 2014  . Sepsis ([place])     PAST SURGICAL HISTORY: Past Surgical History:  Procedure Laterality Date  . CESAREAN SECTION     x2  . PERIPHERAL VASCULAR CATHETERIZATION N/A 11/19/2015   Procedure: Dialysis/Perma Catheter Insertion;   Surgeon: Algernon Huxley, MD;  Location: [facility];  Service: Cardiovascular;  Laterality: N/A;    FAMILY HISTORY: Reviewed and unchanged. No reported history of malignancy or chronic disease.     ADVANCED DIRECTIVES:    HEALTH MAINTENANCE: Social History  Substance Use Topics  . Smoking status: Never Smoker  . Smokeless tobacco: Never Used  . Alcohol use No     Allergies  Allergen Reactions  . No Known Allergies     Current Outpatient Prescriptions  Medication Sig Dispense Refill  . ferrous sulfate 325 (65 FE) MG tablet Take 325 mg by mouth daily with breakfast.    . furosemide (LASIX) 80 MG tablet Take 1 tablet (80 mg total) by mouth daily. 30 tablet 0  . gabapentin (NEURONTIN) 100 MG capsule Take 100 mg by mouth 3 (three) times daily.    . Oxycodone HCl 10 MG TABS Take 1 tablet (10 mg total) by mouth every 6 (six) hours as needed. (Patient taking differently: Take 10 mg by mouth every 6 (six) hours as needed (pain). ) 60 tablet 0  . promethazine (PHENERGAN) 25 MG tablet Take 25 mg by mouth every 6 (six) hours as needed for nausea or vomiting.    . verapamil (CALAN) 40 MG tablet Take 1 tablet (40 mg total) by mouth 2 (two) times daily. 60 tablet 0  . benzonatate (TESSALON) 200 MG capsule Take 1 capsule (200 mg total) by mouth 3 (three) times daily as needed for cough. 30 capsule 2   No current facility-administered medications for this visit.     OBJECTIVE: Vitals:   12/30/15 0920  BP: 135/74  Pulse: (!) 108  Resp: 18  Temp:  98.7 F (37.1 C)     Body mass index is 20.47 kg/m.    ECOG FS:2 - Symptomatic, <50% confined to bed  General: Ill-appearing, no acute distress. Eyes: anicteric sclera. Lungs: Clear to auscultation bilaterally. Heart: Regular rate and rhythm. No rubs, murmurs, or gallops. Abdomen: Soft, nontender, nondistended. No organomegaly noted, normoactive bowel sounds. Musculoskeletal: No edema, cyanosis, or clubbing. Neuro: Alert, answering  all questions appropriately. Cranial nerves grossly intact. Skin: No rashes or petechiae noted. Psych: Normal affect.   LAB RESULTS:  Lab Results  Component Value Date   NA 135 12/30/2015   K 3.8 12/30/2015   CL 96 (L) 12/30/2015   CO2 25 12/30/2015   GLUCOSE 180 (H) 12/30/2015   BUN 37 (H) 12/30/2015   CREATININE 3.92 (H) 12/30/2015   CALCIUM 8.7 (L) 12/30/2015   PROT 6.9 12/30/2015   ALBUMIN 2.9 (L) 12/30/2015   AST 41 12/30/2015   ALT 11 (L) 12/30/2015   ALKPHOS 131 (H) 12/30/2015   BILITOT 1.2 12/30/2015   GFRNONAA 12 (L) 12/30/2015   GFRAA 14 (L) 12/30/2015    Lab Results  Component Value Date   WBC 5.5 12/30/2015   NEUTROABS 4.0 12/30/2015   HGB 5.9 (L) 12/30/2015   HCT 17.4 (L) 12/30/2015   MCV 93.0 12/30/2015   PLT 120 (L) 12/30/2015   Lab Results  Component Value Date   TOTALPROTELP 6.5 12/30/2015   ALBUMINELP 3.0 12/30/2015   A1GS 0.3 12/30/2015   A2GS 1.1 (H) 12/30/2015   BETS 1.3 12/30/2015   GAMS 0.8 12/30/2015   MSPIKE 0.6 (H) 12/30/2015   SPEI Comment 12/30/2015     STUDIES: Ct Abdomen Pelvis Wo Contrast  Result Date: 01/01/2016 CLINICAL DATA:  Right flank pain for 1 week. No hematuria. History multiple myeloma. EXAM: CT ABDOMEN AND PELVIS WITHOUT CONTRAST TECHNIQUE: Multidetector CT imaging of the abdomen and pelvis was performed following the standard protocol without IV contrast. COMPARISON:  11/14/2015 CT FINDINGS: Lower chest: Bilateral small pleural effusions with compressive atelectasis are identified stable on the right and increased on the left in the interval. Small right lower lobe calcified granuloma on series 2, image 13. Hepatobiliary: The unenhanced liver is unremarkable. Contracted appearing gallbladder which is slightly thickened in appearance as a result. There is a trace amount of free fluid/ascites overlying the right hepatic lobe. Pancreas: Slight decrease in peripancreatic inflammation since prior. Stranding along Gerota's  fascia is present. No pseudocyst formation. No ductal dilatation. Spleen: Normal size spleen. Adrenals/Urinary Tract: The adrenal glands are normal. Mild nonspecific perinephric fat stranding is noted bilaterally. There is mild fullness of both renal collecting systems without obstructing calculi. There is no nephrolithiasis. No hydroureter. Findings may be related to chronic ectasia of the renal collecting systems bilaterally. Stomach/Bowel: Normal bowel rotation. No bowel obstruction. Moderate colonic stool burden. Vascular/Lymphatic: Minimal atherosclerosis of the normal-sized abdominal aorta and right internal iliac arteries. Small phleboliths are seen bilaterally in the lower pelvis. Reproductive: There may be a small nabothian cyst or fibroid in the lower uterine segment series 2, image 79 accounting for hypodensity measuring 6 mm. Otherwise negative. Other: Moderate amount of free fluid in the pelvis increased since the interim. Musculoskeletal: Mixed ostia sclerotic and osteolytic abnormality is along the visualized dorsal spine, pelvis and sternum consistent with known history multiple myeloma. Stable T11 compression. IMPRESSION: 1. Mildly dilated renal collecting systems bilaterally without obstructing calculi nor hydroureter. Findings may be due to chronic ectasia of the renal collecting system. 2. Slight interval increase in   left pleural effusion with stable right small pleural effusion. 3. Slight decrease in peripancreatic inflammation. The increase in free fluid within the abdomen and pelvis may be sequela of previously described pancreatitis. 4. Diffuse sclerosis in numerous lytic lesions throughout the visualized osseous elements with chronic height loss of T11. This reflects the patient's known multiple myeloma. Electronically Signed   By: Ashley Royalty M.D.   On: 01/01/2016 14:46   Ct Head Wo Contrast  Result Date: 12/08/2015 CLINICAL DATA:  [place] on concrete today, striking the head. Bump on the  right side. EXAM: CT HEAD WITHOUT CONTRAST TECHNIQUE: Contiguous axial images were obtained from the base of the skull through the vertex without intravenous contrast. COMPARISON:  None. FINDINGS: Brain: No evidence of acute infarction, hemorrhage, hydrocephalus, extra-axial collection or mass lesion/mass effect. Vascular: No hyperdense vessel or unexpected calcification. Skull: Normal. Negative for fracture or focal lesion. Sinuses/Orbits: Mucosal thickening throughout the paranasal sinuses. Mastoid air cells are not opacified. Other: Small subcutaneous scalp hematoma over the right parietal region. IMPRESSION: No acute intracranial abnormalities. Electronically Signed   By: Lucienne Capers M.D.   On: 12/08/2015 21:31   US Transvaginal Non-ob  Result Date: 12/06/2015 CLINICAL DATA:  Postmenopausal bleeding. EXAM: TRANSABDOMINAL AND TRANSVAGINAL ULTRASOUND OF PELVIS TECHNIQUE: Both transabdominal and transvaginal ultrasound examinations of the pelvis were performed. Transabdominal technique was performed for global imaging of the pelvis including uterus, ovaries, adnexal regions, and pelvic cul-de-sac. It was necessary to proceed with endovaginal exam following the transabdominal exam to visualize the endometrium and ovaries. COMPARISON:  None FINDINGS: Uterus Measurements: 7.1 x 3.1 x 3.8 cm. There is a 6 x 3 x 7 mm fibroid in the mid uterus. Endometrium Thickness: 5.6 mm on endovaginal imaging. No focal abnormality visualized. Right ovary Not visualized. Left ovary Not visualized. Other findings There is a 13 mm nabothian cyst.  No free fluid in the pelvis. IMPRESSION: 1. No focal endometrial mass is identified. However, the endometrial stripe thickness was measured at 5.6 mm. Normal is less than 5 mm. Recommend gynecologic consultation and tissue sampling given the history of postmenopausal bleeding and an endometrial stripe thickness of greater than 5 mm. These results will be called to the ordering  clinician or representative by the Radiologist Assistant, and communication documented in the PACS or zVision Dashboard. Electronically Signed   By: Dorise Bullion III M.D   On: 12/06/2015 14:05   US Pelvis Complete  Result Date: 12/06/2015 CLINICAL DATA:  Postmenopausal bleeding. EXAM: TRANSABDOMINAL AND TRANSVAGINAL ULTRASOUND OF PELVIS TECHNIQUE: Both transabdominal and transvaginal ultrasound examinations of the pelvis were performed. Transabdominal technique was performed for global imaging of the pelvis including uterus, ovaries, adnexal regions, and pelvic cul-de-sac. It was necessary to proceed with endovaginal exam following the transabdominal exam to visualize the endometrium and ovaries. COMPARISON:  None FINDINGS: Uterus Measurements: 7.1 x 3.1 x 3.8 cm. There is a 6 x 3 x 7 mm fibroid in the mid uterus. Endometrium Thickness: 5.6 mm on endovaginal imaging. No focal abnormality visualized. Right ovary Not visualized. Left ovary Not visualized. Other findings There is a 13 mm nabothian cyst.  No free fluid in the pelvis. IMPRESSION: 1. No focal endometrial mass is identified. However, the endometrial stripe thickness was measured at 5.6 mm. Normal is less than 5 mm. Recommend gynecologic consultation and tissue sampling given the history of postmenopausal bleeding and an endometrial stripe thickness of greater than 5 mm. These results will be called to the ordering clinician or representative by the  Psychologist, clinical, and communication documented in the PACS or zVision Dashboard. Electronically Signed   By: Dorise Bullion III M.D   On: 12/06/2015 14:05   Dg Hip Unilat W Or Wo Pelvis 2-3 Views Right  Result Date: 12/08/2015 CLINICAL DATA:  [place] on concrete today, striking head. Right hip pain. EXAM: DG HIP (WITH OR WITHOUT PELVIS) 2-3V RIGHT COMPARISON:  None. FINDINGS: Pelvis appears intact. SI joints and symphysis pubis are not displaced. Degenerative changes in the lower lumbar spine and  hips. No evidence of acute fracture or dislocation in the pelvis or the right hip. There are focal rounded areas of sclerosis with central lucency demonstrated in the right femoral intratrochanteric region. This appearance is nonspecific in could be due to osteoporosis. However, focal bone lesions are not excluded. Suggest bone scan for further evaluation. IMPRESSION: No acute bony abnormalities. Degenerative changes in the hips. Nonspecific areas of sclerosis and lucency in the inter trochanteric region of the right hip. Osteoporosis versus focal bone lesions. Suggest bone scan for further evaluation. Electronically Signed   By: Lucienne Capers M.D.   On: 12/08/2015 21:27    ASSESSMENT: Multiple Myeloma in relapse with multiple bony lytic lesions, end-stage renal disease.   PLAN:    1. Multiple Myeloma in relapse with multiple bony lytic lesions: MRI, CT, bone scan results reviewed independently indicating progression of disease. Patient also has progressed end-stage renal disease, likely secondary to underlying myeloma. Patient's performance status has significantly declined, but she wishes to continue with aggressive treatment. We will delay treatment 1 week given patient's significant anemia. Return to clinic on January 06, 2016 to initiate cycle 1, day 1 of Kyporlis. If tolerated, patient will receive treatment on days 1, 2, 8, 9, 15, and 16. Kyporlis does not need to be dose reduced in the setting of end-stage renal disease and dialysis. Patient will also require Zometa on a monthly basis.Marland Kitchen Dexamethasone has been discontinued given her difficult to control blood sugar. Because of patient's immigration status, she could not undergo transplant.  2. Back pain:  Continue current narcotics as prescribed. 3. End-stage renal disease: Likely secondary to progression of disease. Continue dialysis as above. 4. Anemia: Proceed with 2 units packed red blood cells today and then return to clinic later this week  for additional blood. 5. Hyperglycemia: Patient's blood glucose has improved since initiating insulin. Continue monitoring and treatment by primary care.   The entire visit was done in the presence of an interpreter.  Patient expressed understanding and was in agreement with this plan. She also understands that She can call clinic at any time with any questions, concerns, or complaints.    Lloyd Huger, MD   01/04/2016 7:36 AM           Review of Systems

## 2015-12-30 ENCOUNTER — Inpatient Hospital Stay: Payer: Self-pay

## 2015-12-30 ENCOUNTER — Inpatient Hospital Stay (HOSPITAL_BASED_OUTPATIENT_CLINIC_OR_DEPARTMENT_OTHER): Payer: Self-pay | Admitting: Oncology

## 2015-12-30 VITALS — BP 118/72 | HR 99 | Temp 98.2°F | Resp 18

## 2015-12-30 VITALS — BP 135/74 | HR 108 | Temp 98.7°F | Resp 18 | Wt 108.4 lb

## 2015-12-30 DIAGNOSIS — C9002 Multiple myeloma in relapse: Secondary | ICD-10-CM

## 2015-12-30 DIAGNOSIS — D649 Anemia, unspecified: Secondary | ICD-10-CM

## 2015-12-30 DIAGNOSIS — F329 Major depressive disorder, single episode, unspecified: Secondary | ICD-10-CM

## 2015-12-30 DIAGNOSIS — J90 Pleural effusion, not elsewhere classified: Secondary | ICD-10-CM

## 2015-12-30 DIAGNOSIS — R5383 Other fatigue: Secondary | ICD-10-CM

## 2015-12-30 DIAGNOSIS — R109 Unspecified abdominal pain: Secondary | ICD-10-CM

## 2015-12-30 DIAGNOSIS — M1990 Unspecified osteoarthritis, unspecified site: Secondary | ICD-10-CM

## 2015-12-30 DIAGNOSIS — R630 Anorexia: Secondary | ICD-10-CM

## 2015-12-30 DIAGNOSIS — Z992 Dependence on renal dialysis: Secondary | ICD-10-CM

## 2015-12-30 DIAGNOSIS — E1165 Type 2 diabetes mellitus with hyperglycemia: Secondary | ICD-10-CM

## 2015-12-30 DIAGNOSIS — Z79899 Other long term (current) drug therapy: Secondary | ICD-10-CM

## 2015-12-30 DIAGNOSIS — R531 Weakness: Secondary | ICD-10-CM

## 2015-12-30 DIAGNOSIS — N186 End stage renal disease: Secondary | ICD-10-CM

## 2015-12-30 DIAGNOSIS — M549 Dorsalgia, unspecified: Secondary | ICD-10-CM

## 2015-12-30 DIAGNOSIS — F419 Anxiety disorder, unspecified: Secondary | ICD-10-CM

## 2015-12-30 DIAGNOSIS — Z8701 Personal history of pneumonia (recurrent): Secondary | ICD-10-CM

## 2015-12-30 DIAGNOSIS — N888 Other specified noninflammatory disorders of cervix uteri: Secondary | ICD-10-CM

## 2015-12-30 DIAGNOSIS — I129 Hypertensive chronic kidney disease with stage 1 through stage 4 chronic kidney disease, or unspecified chronic kidney disease: Secondary | ICD-10-CM

## 2015-12-30 LAB — CBC WITH DIFFERENTIAL/PLATELET
BASOS PCT: 1 %
Basophils Absolute: 0 10*3/uL (ref 0–0.1)
EOS ABS: 0 10*3/uL (ref 0–0.7)
EOS PCT: 1 %
HCT: 17.4 % — ABNORMAL LOW (ref 35.0–47.0)
HEMOGLOBIN: 5.9 g/dL — AB (ref 12.0–16.0)
Lymphocytes Relative: 12 %
Lymphs Abs: 0.7 10*3/uL — ABNORMAL LOW (ref 1.0–3.6)
MCH: 31.4 pg (ref 26.0–34.0)
MCHC: 33.8 g/dL (ref 32.0–36.0)
MCV: 93 fL (ref 80.0–100.0)
MONOS PCT: 14 %
Monocytes Absolute: 0.8 10*3/uL (ref 0.2–0.9)
NEUTROS PCT: 72 %
Neutro Abs: 4 10*3/uL (ref 1.4–6.5)
PLATELETS: 120 10*3/uL — AB (ref 150–440)
RBC: 1.87 MIL/uL — ABNORMAL LOW (ref 3.80–5.20)
RDW: 20.9 % — AB (ref 11.5–14.5)
WBC: 5.5 10*3/uL (ref 3.6–11.0)

## 2015-12-30 LAB — COMPREHENSIVE METABOLIC PANEL
ALBUMIN: 2.9 g/dL — AB (ref 3.5–5.0)
ALK PHOS: 131 U/L — AB (ref 38–126)
ALT: 11 U/L — ABNORMAL LOW (ref 14–54)
ANION GAP: 14 (ref 5–15)
AST: 41 U/L (ref 15–41)
BUN: 37 mg/dL — ABNORMAL HIGH (ref 6–20)
CHLORIDE: 96 mmol/L — AB (ref 101–111)
CO2: 25 mmol/L (ref 22–32)
Calcium: 8.7 mg/dL — ABNORMAL LOW (ref 8.9–10.3)
Creatinine, Ser: 3.92 mg/dL — ABNORMAL HIGH (ref 0.44–1.00)
GFR calc Af Amer: 14 mL/min — ABNORMAL LOW (ref >60)
GFR calc non Af Amer: 12 mL/min — ABNORMAL LOW (ref >60)
GLUCOSE: 180 mg/dL — AB (ref 65–99)
POTASSIUM: 3.8 mmol/L (ref 3.5–5.1)
SODIUM: 135 mmol/L (ref 135–145)
Total Bilirubin: 1.2 mg/dL (ref 0.3–1.2)
Total Protein: 6.9 g/dL (ref 6.5–8.1)

## 2015-12-30 LAB — PREPARE RBC (CROSSMATCH)

## 2015-12-30 MED ORDER — SODIUM CHLORIDE 0.9 % IV SOLN
250.0000 mL | Freq: Once | INTRAVENOUS | Status: AC
  Administered 2015-12-30: 250 mL via INTRAVENOUS
  Filled 2015-12-30: qty 250

## 2015-12-30 MED ORDER — DIPHENHYDRAMINE HCL 50 MG/ML IJ SOLN
25.0000 mg | Freq: Once | INTRAMUSCULAR | Status: AC
  Administered 2015-12-30: 25 mg via INTRAVENOUS
  Filled 2015-12-30: qty 1

## 2015-12-30 MED ORDER — SODIUM CHLORIDE 0.9 % IJ SOLN
10.0000 mL | Freq: Once | INTRAMUSCULAR | Status: AC
  Administered 2015-12-30: 10 mL via INTRAVENOUS
  Filled 2015-12-30: qty 10

## 2015-12-30 MED ORDER — HEPARIN SOD (PORK) LOCK FLUSH 100 UNIT/ML IV SOLN
500.0000 [IU] | Freq: Once | INTRAVENOUS | Status: AC
  Administered 2015-12-30: 500 [IU] via INTRAVENOUS

## 2015-12-30 MED ORDER — BENZONATATE 200 MG PO CAPS: 200.0000 mg | ORAL_CAPSULE | Freq: Three times a day (TID) | ORAL | 2 refills | Status: DC | PRN

## 2015-12-30 MED ORDER — ACETAMINOPHEN 325 MG PO TABS
650.0000 mg | ORAL_TABLET | Freq: Once | ORAL | Status: AC
  Administered 2015-12-30: 650 mg via ORAL
  Filled 2015-12-30: qty 2

## 2015-12-30 NOTE — Progress Notes (Signed)
States had stomach pains last night but resolved this morning. Continues to have numbness in both legs. Has dry cough, pt was instructed to try OTC cough syrup. Pt not eating and drinking well and questioned if needed refill on megace. Also, pt feels like "ears are clogged up."

## 2015-12-31 ENCOUNTER — Other Ambulatory Visit: Payer: Self-pay | Admitting: *Deleted

## 2015-12-31 ENCOUNTER — Inpatient Hospital Stay: Payer: Self-pay

## 2015-12-31 DIAGNOSIS — C9002 Multiple myeloma in relapse: Secondary | ICD-10-CM

## 2015-12-31 LAB — PROTEIN ELECTROPHORESIS, SERUM
A/G Ratio: 0.9 (ref 0.7–1.7)
ALBUMIN ELP: 3 g/dL (ref 2.9–4.4)
ALPHA-1-GLOBULIN: 0.3 g/dL (ref 0.0–0.4)
Alpha-2-Globulin: 1.1 g/dL — ABNORMAL HIGH (ref 0.4–1.0)
Beta Globulin: 1.3 g/dL (ref 0.7–1.3)
GAMMA GLOBULIN: 0.8 g/dL (ref 0.4–1.8)
Globulin, Total: 3.5 g/dL (ref 2.2–3.9)
M-Spike, %: 0.6 g/dL — ABNORMAL HIGH
TOTAL PROTEIN ELP: 6.5 g/dL (ref 6.0–8.5)

## 2015-12-31 LAB — KAPPA/LAMBDA LIGHT CHAINS
Kappa free light chain: 25309.9 mg/L — ABNORMAL HIGH (ref 3.3–19.4)
Kappa, lambda light chain ratio: 1086.26 — ABNORMAL HIGH (ref 0.26–1.65)
Lambda free light chains: 23.3 mg/L (ref 5.7–26.3)

## 2015-12-31 LAB — IGG, IGA, IGM
IGG (IMMUNOGLOBIN G), SERUM: 879 mg/dL (ref 700–1600)
IgA: 78 mg/dL — ABNORMAL LOW (ref 87–352)
IgM, Serum: 10 mg/dL — ABNORMAL LOW (ref 26–217)

## 2015-12-31 LAB — PREPARE RBC (CROSSMATCH)

## 2016-01-01 ENCOUNTER — Ambulatory Visit
Admission: RE | Admit: 2016-01-01 | Discharge: 2016-01-01 | Disposition: A | Discharge status: Home / Self Care | Payer: Self-pay | Source: Ambulatory Visit | Attending: Nephrology | Admitting: Nephrology

## 2016-01-01 ENCOUNTER — Other Ambulatory Visit: Payer: Self-pay | Admitting: Oncology

## 2016-01-01 ENCOUNTER — Inpatient Hospital Stay: Payer: Self-pay

## 2016-01-01 DIAGNOSIS — R109 Unspecified abdominal pain: Secondary | ICD-10-CM | POA: Insufficient documentation

## 2016-01-01 DIAGNOSIS — C9002 Multiple myeloma in relapse: Secondary | ICD-10-CM

## 2016-01-01 DIAGNOSIS — J90 Pleural effusion, not elsewhere classified: Secondary | ICD-10-CM | POA: Insufficient documentation

## 2016-01-01 DIAGNOSIS — C9000 Multiple myeloma not having achieved remission: Secondary | ICD-10-CM | POA: Insufficient documentation

## 2016-01-01 LAB — PREPARE RBC (CROSSMATCH)

## 2016-01-01 MED ORDER — DIPHENHYDRAMINE HCL 50 MG/ML IJ SOLN
25.0000 mg | Freq: Once | INTRAMUSCULAR | Status: AC
  Administered 2016-01-01: 25 mg via INTRAVENOUS
  Filled 2016-01-01: qty 1

## 2016-01-01 MED ORDER — ACETAMINOPHEN 325 MG PO TABS
650.0000 mg | ORAL_TABLET | Freq: Once | ORAL | Status: AC
  Administered 2016-01-01: 650 mg via ORAL
  Filled 2016-01-01: qty 2

## 2016-01-01 MED ORDER — SODIUM CHLORIDE 0.9 % IV SOLN
250.0000 mL | Freq: Once | INTRAVENOUS | Status: AC
  Administered 2016-01-01: 250 mL via INTRAVENOUS
  Filled 2016-01-01: qty 250

## 2016-01-01 MED ORDER — HEPARIN SOD (PORK) LOCK FLUSH 100 UNIT/ML IV SOLN
500.0000 [IU] | Freq: Every day | INTRAVENOUS | Status: AC | PRN
  Administered 2016-01-01: 500 [IU]
  Filled 2016-01-01: qty 5

## 2016-01-01 MED ORDER — SODIUM CHLORIDE 0.9% FLUSH
3.0000 mL | INTRAVENOUS | Status: DC | PRN
  Filled 2016-01-01: qty 3

## 2016-01-03 LAB — TYPE AND SCREEN
ABO/RH(D): B POS
ANTIBODY SCREEN: NEGATIVE
UNIT DIVISION: 0
Unit division: 0

## 2016-01-04 NOTE — Progress Notes (Signed)
START ON PATHWAY REGIMEN - Multiple Myeloma  [id number]: Kd (Carfilzomib 20/56 mg/m2 + Dexamethasone 20 mg) q28 Days Until Disease Progression or Unacceptable Toxicity   A cycle is every 28 days:     Dexamethasone (Decadron(R)) 20 mg orally once daily on days 1, 2, 8, 9, 15, 16, 22, and 23 of all cycles. Administer 30 min to 4 hours prior to carfilzomib Dose Mod: None     Carfilzomib (Kyprolis(R)) 20 mg/m2 IV in 50 mL D5W over 30 mins for cycle 1 - on days 1 and 2. If tolerated, escalate to target dose as follows. Cap BSA at 2.2 m2 Dose Mod: None     Carfilzomib (Kyprolis(R)) 56 mg/m2 IV in 50 mL D5W over 30 mins, if initial dose was tolerated. Cap BSA at 2.2 m2. Cycle 1: on days 8, 9, 15, 16. Cycles 2 and beyond: on days 1, 2, 8, 9, 15, 16 Dose Mod: None Additional Orders: Adequate hydration recommended by PI, including both oral and IV fluids (250-500 mL appropriate IV fluid) before dosing in cycle 1 (additional post-treatment fluids prn); continue as needed in subsequent cycles. Monitor for evidence of  volume overload. H zoster prophylaxis recommended; thromboprophylaxis based on patient risk factors recommended.  **Always confirm dose/schedule in your pharmacy ordering system**    Patient Characteristics: Relapsed / Refractory, All Lines of Therapy R-ISS Staging: Unknown Disease Classification: Refractory Line of Therapy: Third Line  Intent of Therapy: Non-Curative / Palliative Intent, Discussed with Patient

## 2016-01-05 NOTE — Progress Notes (Signed)
[place]  [phone]) [phone] [phone]) [phone]  ID: Jamie Keller OB: [birth year]  MR#: [id number]  ION#:[id number]  [name], MD as PCP - General (Family Medicine)  CHIEF COMPLAINT: Multiple Myeloma in relapse with multiple bony lytic lesions, now with end-stage renal disease on dialysis.   INTERVAL HISTORY: Patient returns to clinic today for further evaluation and reconsideration of cycle 1, day 1 of Kyporlis. She continues to feel significantly weak and fatigued with a declining performance status, but states it mildly improved since receiving blood transfusions last week. She states her pain is well-controlled. She is depressed. She has a poor appetite. She has no neurologic complaints. She denies any further fevers. She does not complain of any nausea or vomiting. She denies any chest pain or shortness of breath.  She denies any constipation or diarrhea. She has no urinary complaints. Patient offers no further specific complaints today.  REVIEW OF SYSTEMS:   Review of Systems  Constitutional: Positive for malaise/fatigue and weight loss. Negative for fever.  Respiratory: Negative.  Negative for cough and shortness of breath.   Cardiovascular: Negative.  Negative for chest pain.  Gastrointestinal: Negative.  Negative for abdominal pain, nausea and vomiting.  Musculoskeletal: Positive for back pain.  Neurological: Positive for weakness. Negative for tingling and focal weakness.  Psychiatric/Behavioral: Positive for depression. The patient is nervous/anxious.    As per HPI. Otherwise, a complete review of systems is negative.   PAST MEDICAL HISTORY: Past Medical History:  Diagnosis Date  . Diabetes mellitus without complication ([place])   . Hypertension   . Multiple myeloma ([place])   . Osteoarthritis   . Pneumonia   . Post-menopausal 2014  . Sepsis ([place])     PAST SURGICAL HISTORY: Past Surgical History:    Procedure Laterality Date  . CESAREAN SECTION     x2  . PERIPHERAL VASCULAR CATHETERIZATION N/A 11/19/2015   Procedure: Dialysis/Perma Catheter Insertion;  Surgeon: Algernon Huxley, MD;  Location: [facility];  Service: Cardiovascular;  Laterality: N/A;    FAMILY HISTORY: Reviewed and unchanged. No reported history of malignancy or chronic disease.     ADVANCED DIRECTIVES:    HEALTH MAINTENANCE: Social History  Substance Use Topics  . Smoking status: Never Smoker  . Smokeless tobacco: Never Used  . Alcohol use No     Allergies  Allergen Reactions  . No Known Allergies     Current Outpatient Prescriptions  Medication Sig Dispense Refill  . benzonatate (TESSALON) 200 MG capsule Take 1 capsule (200 mg total) by mouth 3 (three) times daily as needed for cough. 30 capsule 2  . ferrous sulfate 325 (65 FE) MG tablet Take 325 mg by mouth daily with breakfast.    . furosemide (LASIX) 80 MG tablet Take 1 tablet (80 mg total) by mouth daily. 30 tablet 0  . gabapentin (NEURONTIN) 100 MG capsule Take 100 mg by mouth 3 (three) times daily.    . Oxycodone HCl 10 MG TABS Take 1 tablet (10 mg total) by mouth every 6 (six) hours as needed. (Patient taking differently: Take 10 mg by mouth every 6 (six) hours as needed (pain). ) 60 tablet 0  . promethazine (PHENERGAN) 25 MG tablet Take 25 mg by mouth every 6 (six) hours as needed for nausea or vomiting.    . verapamil (CALAN) 40 MG tablet Take 1 tablet (40 mg total) by mouth 2 (two) times daily. 60 tablet 0   No current facility-administered  medications for this visit.    Facility-Administered Medications Ordered in Other Visits  Medication Dose Route Frequency Provider Last Rate Last Dose  . sodium chloride flush (NS) 0.9 % injection 10 mL  10 mL Intracatheter PRN Lloyd Huger, MD   10 mL at 01/06/16 0959    OBJECTIVE: Vitals:   01/06/16 1020  BP: 138/72  Pulse: (!) 103  Resp: 18  Temp: 98.8 F (37.1 C)     Body mass  index is 20.62 kg/m.    ECOG FS:2 - Symptomatic, <50% confined to bed  General: Ill-appearing, no acute distress. Eyes: anicteric sclera. Lungs: Clear to auscultation bilaterally. Heart: Regular rate and rhythm. No rubs, murmurs, or gallops. Abdomen: Soft, nontender, nondistended. No organomegaly noted, normoactive bowel sounds. Musculoskeletal: No edema, cyanosis, or clubbing. Neuro: Alert, answering all questions appropriately. Cranial nerves grossly intact. Skin: No rashes or petechiae noted. Psych: Normal affect.   LAB RESULTS:  Lab Results  Component Value Date   NA 135 01/06/2016   K 3.3 (L) 01/06/2016   CL 95 (L) 01/06/2016   CO2 26 01/06/2016   GLUCOSE 169 (H) 01/06/2016   BUN 37 (H) 01/06/2016   CREATININE 4.38 (H) 01/06/2016   CALCIUM 9.4 01/06/2016   PROT 7.2 01/06/2016   ALBUMIN 3.0 (L) 01/06/2016   AST 43 (H) 01/06/2016   ALT 14 01/06/2016   ALKPHOS 120 01/06/2016   BILITOT 1.4 (H) 01/06/2016   GFRNONAA 11 (L) 01/06/2016   GFRAA 12 (L) 01/06/2016    Lab Results  Component Value Date   WBC 5.1 01/06/2016   NEUTROABS 3.9 01/06/2016   HGB 8.4 (L) 01/06/2016   HCT 24.7 (L) 01/06/2016   MCV 90.7 01/06/2016   PLT 83 (L) 01/06/2016   Lab Results  Component Value Date   TOTALPROTELP 6.5 12/30/2015   ALBUMINELP 3.0 12/30/2015   A1GS 0.3 12/30/2015   A2GS 1.1 (H) 12/30/2015   BETS 1.3 12/30/2015   GAMS 0.8 12/30/2015   MSPIKE 0.6 (H) 12/30/2015   SPEI Comment 12/30/2015     STUDIES: Ct Abdomen Pelvis Wo Contrast  Result Date: 01/01/2016 CLINICAL DATA:  Right flank pain for 1 week. No hematuria. History multiple myeloma. EXAM: CT ABDOMEN AND PELVIS WITHOUT CONTRAST TECHNIQUE: Multidetector CT imaging of the abdomen and pelvis was performed following the standard protocol without IV contrast. COMPARISON:  11/14/2015 CT FINDINGS: Lower chest: Bilateral small pleural effusions with compressive atelectasis are identified stable on the right and increased on  the left in the interval. Small right lower lobe calcified granuloma on series 2, image 13. Hepatobiliary: The unenhanced liver is unremarkable. Contracted appearing gallbladder which is slightly thickened in appearance as a result. There is a trace amount of free fluid/ascites overlying the right hepatic lobe. Pancreas: Slight decrease in peripancreatic inflammation since prior. Stranding along Gerota's fascia is present. No pseudocyst formation. No ductal dilatation. Spleen: Normal size spleen. Adrenals/Urinary Tract: The adrenal glands are normal. Mild nonspecific perinephric fat stranding is noted bilaterally. There is mild fullness of both renal collecting systems without obstructing calculi. There is no nephrolithiasis. No hydroureter. Findings may be related to chronic ectasia of the renal collecting systems bilaterally. Stomach/Bowel: Normal bowel rotation. No bowel obstruction. Moderate colonic stool burden. Vascular/Lymphatic: Minimal atherosclerosis of the normal-sized abdominal aorta and right internal iliac arteries. Small phleboliths are seen bilaterally in the lower pelvis. Reproductive: There may be a small nabothian cyst or fibroid in the lower uterine segment series 2, image 79 accounting for hypodensity measuring 6  mm. Otherwise negative. Other: Moderate amount of free fluid in the pelvis increased since the interim. Musculoskeletal: Mixed ostia sclerotic and osteolytic abnormality is along the visualized dorsal spine, pelvis and sternum consistent with known history multiple myeloma. Stable T11 compression. IMPRESSION: 1. Mildly dilated renal collecting systems bilaterally without obstructing calculi nor hydroureter. Findings may be due to chronic ectasia of the renal collecting system. 2. Slight interval increase in left pleural effusion with stable right small pleural effusion. 3. Slight decrease in peripancreatic inflammation. The increase in free fluid within the abdomen and pelvis may be  sequela of previously described pancreatitis. 4. Diffuse sclerosis in numerous lytic lesions throughout the visualized osseous elements with chronic height loss of T11. This reflects the patient's known multiple myeloma. Electronically Signed   By: Ashley Royalty M.D.   On: 01/01/2016 14:46   Ct Head Wo Contrast  Result Date: 12/08/2015 CLINICAL DATA:  [place] on concrete today, striking the head. Bump on the right side. EXAM: CT HEAD WITHOUT CONTRAST TECHNIQUE: Contiguous axial images were obtained from the base of the skull through the vertex without intravenous contrast. COMPARISON:  None. FINDINGS: Brain: No evidence of acute infarction, hemorrhage, hydrocephalus, extra-axial collection or mass lesion/mass effect. Vascular: No hyperdense vessel or unexpected calcification. Skull: Normal. Negative for fracture or focal lesion. Sinuses/Orbits: Mucosal thickening throughout the paranasal sinuses. Mastoid air cells are not opacified. Other: Small subcutaneous scalp hematoma over the right parietal region. IMPRESSION: No acute intracranial abnormalities. Electronically Signed   By: Lucienne Capers M.D.   On: 12/08/2015 21:31   Dg Hip Unilat W Or Wo Pelvis 2-3 Views Right  Result Date: 12/08/2015 CLINICAL DATA:  [place] on concrete today, striking head. Right hip pain. EXAM: DG HIP (WITH OR WITHOUT PELVIS) 2-3V RIGHT COMPARISON:  None. FINDINGS: Pelvis appears intact. SI joints and symphysis pubis are not displaced. Degenerative changes in the lower lumbar spine and hips. No evidence of acute fracture or dislocation in the pelvis or the right hip. There are focal rounded areas of sclerosis with central lucency demonstrated in the right femoral intratrochanteric region. This appearance is nonspecific in could be due to osteoporosis. However, focal bone lesions are not excluded. Suggest bone scan for further evaluation. IMPRESSION: No acute bony abnormalities. Degenerative changes in the hips. Nonspecific areas of  sclerosis and lucency in the inter trochanteric region of the right hip. Osteoporosis versus focal bone lesions. Suggest bone scan for further evaluation. Electronically Signed   By: Lucienne Capers M.D.   On: 12/08/2015 21:27    ASSESSMENT: Multiple Myeloma in relapse with multiple bony lytic lesions, end-stage renal disease.   PLAN:    1. Multiple Myeloma in relapse with multiple bony lytic lesions: MRI, CT, bone scan results reviewed independently indicating progression of disease. Patient also has progressed end-stage renal disease, likely secondary to underlying myeloma. Patient's performance status has significantly declined, but she wishes to continue with aggressive treatment. Although patient's M spike is only 0.6, her kappa free light chains are greater than 25,000. Despite thrombocytopenia, proceed with cycle 1, day 1 of Kyporlis. If tolerated, patient will receive treatment on days 1, 2, 8, 9, 15, and 16. Kyporlis does not need to be dose reduced in the setting of end-stage renal disease and dialysis. Can consider Delton See given her renal failure. Dexamethasone has been discontinued given her difficult to control blood sugar. Because of patient's immigration status, she could not undergo transplant.  2. Back pain:  Continue current narcotics as prescribed. 3. End-stage renal  disease: Likely secondary to progression of disease. Continue dialysis as above. 4. Anemia: Improved with blood transfusion last week. Monitor. 5. Hyperglycemia: Patient's blood glucose has improved since initiating insulin. Continue monitoring and treatment by primary care.   The entire visit was done in the presence of an interpreter.  Patient expressed understanding and was in agreement with this plan. She also understands that She can call clinic at any time with any questions, concerns, or complaints.    Lloyd Huger, MD   01/06/2016 1:13 PM           ROS

## 2016-01-06 ENCOUNTER — Inpatient Hospital Stay: Payer: Self-pay

## 2016-01-06 ENCOUNTER — Encounter: Payer: Self-pay | Admitting: Oncology

## 2016-01-06 ENCOUNTER — Inpatient Hospital Stay (HOSPITAL_BASED_OUTPATIENT_CLINIC_OR_DEPARTMENT_OTHER): Payer: Self-pay | Admitting: Oncology

## 2016-01-06 VITALS — BP 138/72 | HR 103 | Temp 98.8°F | Resp 18 | Wt 109.1 lb

## 2016-01-06 DIAGNOSIS — D649 Anemia, unspecified: Secondary | ICD-10-CM

## 2016-01-06 DIAGNOSIS — Z8701 Personal history of pneumonia (recurrent): Secondary | ICD-10-CM

## 2016-01-06 DIAGNOSIS — M549 Dorsalgia, unspecified: Secondary | ICD-10-CM

## 2016-01-06 DIAGNOSIS — N888 Other specified noninflammatory disorders of cervix uteri: Secondary | ICD-10-CM

## 2016-01-06 DIAGNOSIS — R109 Unspecified abdominal pain: Secondary | ICD-10-CM

## 2016-01-06 DIAGNOSIS — R5383 Other fatigue: Secondary | ICD-10-CM

## 2016-01-06 DIAGNOSIS — M1990 Unspecified osteoarthritis, unspecified site: Secondary | ICD-10-CM

## 2016-01-06 DIAGNOSIS — Z992 Dependence on renal dialysis: Secondary | ICD-10-CM

## 2016-01-06 DIAGNOSIS — F329 Major depressive disorder, single episode, unspecified: Secondary | ICD-10-CM

## 2016-01-06 DIAGNOSIS — F419 Anxiety disorder, unspecified: Secondary | ICD-10-CM

## 2016-01-06 DIAGNOSIS — E1165 Type 2 diabetes mellitus with hyperglycemia: Secondary | ICD-10-CM

## 2016-01-06 DIAGNOSIS — J90 Pleural effusion, not elsewhere classified: Secondary | ICD-10-CM

## 2016-01-06 DIAGNOSIS — R630 Anorexia: Secondary | ICD-10-CM

## 2016-01-06 DIAGNOSIS — C9002 Multiple myeloma in relapse: Secondary | ICD-10-CM

## 2016-01-06 DIAGNOSIS — Z79899 Other long term (current) drug therapy: Secondary | ICD-10-CM

## 2016-01-06 DIAGNOSIS — N186 End stage renal disease: Secondary | ICD-10-CM

## 2016-01-06 DIAGNOSIS — R531 Weakness: Secondary | ICD-10-CM

## 2016-01-06 DIAGNOSIS — I129 Hypertensive chronic kidney disease with stage 1 through stage 4 chronic kidney disease, or unspecified chronic kidney disease: Secondary | ICD-10-CM

## 2016-01-06 LAB — COMPREHENSIVE METABOLIC PANEL
ALK PHOS: 120 U/L (ref 38–126)
ALT: 14 U/L (ref 14–54)
ANION GAP: 14 (ref 5–15)
AST: 43 U/L — ABNORMAL HIGH (ref 15–41)
Albumin: 3 g/dL — ABNORMAL LOW (ref 3.5–5.0)
BUN: 37 mg/dL — ABNORMAL HIGH (ref 6–20)
CALCIUM: 9.4 mg/dL (ref 8.9–10.3)
CHLORIDE: 95 mmol/L — AB (ref 101–111)
CO2: 26 mmol/L (ref 22–32)
Creatinine, Ser: 4.38 mg/dL — ABNORMAL HIGH (ref 0.44–1.00)
GFR calc non Af Amer: 11 mL/min — ABNORMAL LOW (ref >60)
GFR, EST AFRICAN AMERICAN: 12 mL/min — AB (ref >60)
Glucose, Bld: 169 mg/dL — ABNORMAL HIGH (ref 65–99)
Potassium: 3.3 mmol/L — ABNORMAL LOW (ref 3.5–5.1)
SODIUM: 135 mmol/L (ref 135–145)
Total Bilirubin: 1.4 mg/dL — ABNORMAL HIGH (ref 0.3–1.2)
Total Protein: 7.2 g/dL (ref 6.5–8.1)

## 2016-01-06 LAB — CBC WITH DIFFERENTIAL/PLATELET
Basophils Absolute: 0 10*3/uL (ref 0–0.1)
Basophils Relative: 1 %
EOS ABS: 0 10*3/uL (ref 0–0.7)
EOS PCT: 0 %
HCT: 24.7 % — ABNORMAL LOW (ref 35.0–47.0)
Hemoglobin: 8.4 g/dL — ABNORMAL LOW (ref 12.0–16.0)
LYMPHS ABS: 0.5 10*3/uL — AB (ref 1.0–3.6)
Lymphocytes Relative: 9 %
MCH: 30.8 pg (ref 26.0–34.0)
MCHC: 34 g/dL (ref 32.0–36.0)
MCV: 90.7 fL (ref 80.0–100.0)
Monocytes Absolute: 0.7 10*3/uL (ref 0.2–0.9)
Monocytes Relative: 13 %
Neutro Abs: 3.9 10*3/uL (ref 1.4–6.5)
Neutrophils Relative %: 77 %
PLATELETS: 83 10*3/uL — AB (ref 150–440)
RBC: 2.72 MIL/uL — ABNORMAL LOW (ref 3.80–5.20)
RDW: 18.9 % — ABNORMAL HIGH (ref 11.5–14.5)
WBC: 5.1 10*3/uL (ref 3.6–11.0)

## 2016-01-06 MED ORDER — PROCHLORPERAZINE MALEATE 10 MG PO TABS
10.0000 mg | ORAL_TABLET | Freq: Once | ORAL | Status: AC
  Administered 2016-01-06: 10 mg via ORAL
  Filled 2016-01-06: qty 1

## 2016-01-06 MED ORDER — SODIUM CHLORIDE 0.9% FLUSH
10.0000 mL | INTRAVENOUS | Status: DC | PRN
  Administered 2016-01-06: 10 mL
  Filled 2016-01-06: qty 10

## 2016-01-06 MED ORDER — SODIUM CHLORIDE 0.9 % IV SOLN
Freq: Once | INTRAVENOUS | Status: AC
  Administered 2016-01-06: 11:00:00 via INTRAVENOUS
  Filled 2016-01-06: qty 1000

## 2016-01-06 MED ORDER — SODIUM CHLORIDE 0.9 % IV SOLN
Freq: Once | INTRAVENOUS | Status: AC
  Administered 2016-01-06: 12:00:00 via INTRAVENOUS
  Filled 2016-01-06: qty 1000

## 2016-01-06 MED ORDER — DEXTROSE 5 % IV SOLN
20.0000 mg/m2 | Freq: Once | INTRAVENOUS | Status: AC
  Administered 2016-01-06: 30 mg via INTRAVENOUS
  Filled 2016-01-06: qty 15

## 2016-01-06 MED ORDER — HEPARIN SOD (PORK) LOCK FLUSH 100 UNIT/ML IV SOLN
500.0000 [IU] | Freq: Once | INTRAVENOUS | Status: AC | PRN
  Administered 2016-01-06: 500 [IU]
  Filled 2016-01-06: qty 5

## 2016-01-06 NOTE — Progress Notes (Signed)
Pt complains of itching all over her body this morning. Pt not sleeping well at night and sleeping more during the day.

## 2016-01-06 NOTE — Progress Notes (Signed)
Platelets 83,000. Per Dr Grayland Ormond may proceed with treatment

## 2016-01-07 ENCOUNTER — Ambulatory Visit: Payer: Self-pay

## 2016-01-07 ENCOUNTER — Inpatient Hospital Stay: Payer: Self-pay | Attending: Oncology

## 2016-01-07 VITALS — BP 128/56 | HR 114 | Temp 98.0°F | Resp 18

## 2016-01-07 DIAGNOSIS — Z79899 Other long term (current) drug therapy: Secondary | ICD-10-CM | POA: Insufficient documentation

## 2016-01-07 DIAGNOSIS — Z5111 Encounter for antineoplastic chemotherapy: Secondary | ICD-10-CM | POA: Insufficient documentation

## 2016-01-07 DIAGNOSIS — D696 Thrombocytopenia, unspecified: Secondary | ICD-10-CM | POA: Insufficient documentation

## 2016-01-07 DIAGNOSIS — R5383 Other fatigue: Secondary | ICD-10-CM | POA: Insufficient documentation

## 2016-01-07 DIAGNOSIS — D649 Anemia, unspecified: Secondary | ICD-10-CM | POA: Insufficient documentation

## 2016-01-07 DIAGNOSIS — Z992 Dependence on renal dialysis: Secondary | ICD-10-CM | POA: Insufficient documentation

## 2016-01-07 DIAGNOSIS — I517 Cardiomegaly: Secondary | ICD-10-CM | POA: Insufficient documentation

## 2016-01-07 DIAGNOSIS — F329 Major depressive disorder, single episode, unspecified: Secondary | ICD-10-CM | POA: Insufficient documentation

## 2016-01-07 DIAGNOSIS — M549 Dorsalgia, unspecified: Secondary | ICD-10-CM | POA: Insufficient documentation

## 2016-01-07 DIAGNOSIS — R630 Anorexia: Secondary | ICD-10-CM | POA: Insufficient documentation

## 2016-01-07 DIAGNOSIS — C9002 Multiple myeloma in relapse: Secondary | ICD-10-CM | POA: Insufficient documentation

## 2016-01-07 DIAGNOSIS — E1122 Type 2 diabetes mellitus with diabetic chronic kidney disease: Secondary | ICD-10-CM | POA: Insufficient documentation

## 2016-01-07 DIAGNOSIS — N186 End stage renal disease: Secondary | ICD-10-CM | POA: Insufficient documentation

## 2016-01-07 DIAGNOSIS — M1990 Unspecified osteoarthritis, unspecified site: Secondary | ICD-10-CM | POA: Insufficient documentation

## 2016-01-07 DIAGNOSIS — Z8701 Personal history of pneumonia (recurrent): Secondary | ICD-10-CM | POA: Insufficient documentation

## 2016-01-07 DIAGNOSIS — R531 Weakness: Secondary | ICD-10-CM | POA: Insufficient documentation

## 2016-01-07 DIAGNOSIS — J90 Pleural effusion, not elsewhere classified: Secondary | ICD-10-CM | POA: Insufficient documentation

## 2016-01-07 DIAGNOSIS — I120 Hypertensive chronic kidney disease with stage 5 chronic kidney disease or end stage renal disease: Secondary | ICD-10-CM | POA: Insufficient documentation

## 2016-01-07 MED ORDER — DEXTROSE 5 % IV SOLN
20.0000 mg/m2 | Freq: Once | INTRAVENOUS | Status: AC
  Administered 2016-01-07: 30 mg via INTRAVENOUS
  Filled 2016-01-07: qty 15

## 2016-01-07 MED ORDER — SODIUM CHLORIDE 0.9 % IV SOLN
Freq: Once | INTRAVENOUS | Status: DC
  Filled 2016-01-07: qty 1000

## 2016-01-07 MED ORDER — PROCHLORPERAZINE MALEATE 10 MG PO TABS
10.0000 mg | ORAL_TABLET | Freq: Once | ORAL | Status: AC
Start: 2016-01-07 — End: 2016-01-07
  Administered 2016-01-07: 10 mg via ORAL
  Filled 2016-01-07: qty 1

## 2016-01-07 MED ORDER — DEXTROSE 5 % IV SOLN
Freq: Once | INTRAVENOUS | Status: AC
  Administered 2016-01-07: 11:00:00 via INTRAVENOUS
  Filled 2016-01-07: qty 1000

## 2016-01-07 MED ORDER — SODIUM CHLORIDE 0.9 % IV SOLN
Freq: Once | INTRAVENOUS | Status: AC
  Administered 2016-01-07: 10:00:00 via INTRAVENOUS
  Filled 2016-01-07: qty 1000

## 2016-01-07 MED ORDER — HEPARIN SOD (PORK) LOCK FLUSH 100 UNIT/ML IV SOLN
500.0000 [IU] | Freq: Once | INTRAVENOUS | Status: AC | PRN
Start: 2016-01-07 — End: 2016-01-07
  Administered 2016-01-07: 500 [IU]
  Filled 2016-01-07: qty 5

## 2016-01-07 MED ORDER — SODIUM CHLORIDE 0.9% FLUSH
10.0000 mL | INTRAVENOUS | Status: DC | PRN
  Administered 2016-01-07: 10 mL
  Filled 2016-01-07: qty 10

## 2016-01-11 NOTE — Progress Notes (Signed)
[place]  Telephone:(336) [phone] [phone]) [phone]  ID: [name] OB: 1965-03-26  MR#: [id number]  WPV#:[id number]  [name], MD as PCP - General (Family Medicine)  CHIEF COMPLAINT: Multiple Myeloma in relapse with multiple bony lytic lesions, now with end-stage renal disease on dialysis.   INTERVAL HISTORY: Patient returns to clinic today for further evaluation and consideration of cycle 1, day 8 of Kyporlis. She continues to feel significantly weak and fatigued with a declining performance status, but otherwise tolerated her treatment well. She is depressed. She has a poor appetite. She has no neurologic complaints. She denies any further fevers. She does not complain of any nausea or vomiting. She denies any chest pain or shortness of breath.  She denies any constipation or diarrhea. She has no urinary complaints. Patient offers no further specific complaints today.  REVIEW OF SYSTEMS:   Review of Systems  Constitutional: Positive for malaise/fatigue and weight loss. Negative for fever.  Respiratory: Negative.  Negative for cough and shortness of breath.   Cardiovascular: Negative.  Negative for chest pain.  Gastrointestinal: Negative.  Negative for abdominal pain, nausea and vomiting.  Musculoskeletal: Positive for back pain.  Neurological: Positive for weakness. Negative for tingling and focal weakness.  Psychiatric/Behavioral: Positive for depression. The patient is nervous/anxious.    As per HPI. Otherwise, a complete review of systems is negative.   PAST MEDICAL HISTORY: Past Medical History:  Diagnosis Date  . Diabetes mellitus without complication ([place])   . Hypertension   . Multiple myeloma ([place])   . Osteoarthritis   . Pneumonia   . Post-menopausal 2014  . Sepsis ([place])     PAST SURGICAL HISTORY: Past Surgical History:  Procedure Laterality Date  . CESAREAN SECTION     x2  . PERIPHERAL VASCULAR  CATHETERIZATION N/A 11/19/2015   Procedure: Dialysis/Perma Catheter Insertion;  Surgeon: Algernon Huxley, MD;  Location: [facility];  Service: Cardiovascular;  Laterality: N/A;    FAMILY HISTORY: Reviewed and unchanged. No reported history of malignancy or chronic disease.     ADVANCED DIRECTIVES:    HEALTH MAINTENANCE: Social History  Substance Use Topics  . Smoking status: Never Smoker  . Smokeless tobacco: Never Used  . Alcohol use No     Allergies  Allergen Reactions  . No Known Allergies     Current Outpatient Prescriptions  Medication Sig Dispense Refill  . benzonatate (TESSALON) 200 MG capsule Take 1 capsule (200 mg total) by mouth 3 (three) times daily as needed for cough. 30 capsule 2  . ferrous sulfate 325 (65 FE) MG tablet Take 325 mg by mouth daily with breakfast.    . furosemide (LASIX) 80 MG tablet Take 1 tablet (80 mg total) by mouth daily. 30 tablet 0  . gabapentin (NEURONTIN) 100 MG capsule Take 100 mg by mouth 3 (three) times daily.    . Oxycodone HCl 10 MG TABS Take 1 tablet (10 mg total) by mouth every 6 (six) hours as needed. (Patient taking differently: Take 10 mg by mouth every 6 (six) hours as needed (pain). ) 60 tablet 0  . promethazine (PHENERGAN) 25 MG tablet Take 25 mg by mouth every 6 (six) hours as needed for nausea or vomiting.    . verapamil (CALAN) 40 MG tablet Take 1 tablet (40 mg total) by mouth 2 (two) times daily. 60 tablet 0   No current facility-administered medications for this visit.    Facility-Administered Medications Ordered in Other  Visits  Medication Dose Route Frequency Provider Last Rate Last Dose  . 0.9 %  sodium chloride infusion   Intravenous Once Lloyd Huger, MD      . carfilzomib (KYPROLIS) 30 mg in dextrose 5 % 50 mL chemo infusion  20 mg/m2 (Treatment Plan Recorded) Intravenous Once Lloyd Huger, MD      . prochlorperazine (COMPAZINE) tablet 10 mg  10 mg Oral Once Lloyd Huger, MD         OBJECTIVE: Vitals:   01/13/16 0918  BP: 134/67  Pulse: 95  Resp: 18  Temp: 97.4 F (36.3 C)     Body mass index is 20.52 kg/m.    ECOG FS:2 - Symptomatic, <50% confined to bed  General: Ill-appearing, no acute distress. Eyes: anicteric sclera. Lungs: Clear to auscultation bilaterally. Heart: Regular rate and rhythm. No rubs, murmurs, or gallops. Abdomen: Soft, nontender, nondistended. No organomegaly noted, normoactive bowel sounds. Musculoskeletal: No edema, cyanosis, or clubbing. Neuro: Alert, answering all questions appropriately. Cranial nerves grossly intact. Skin: No rashes or petechiae noted. Psych: Normal affect.   LAB RESULTS:  Lab Results  Component Value Date   NA 137 01/13/2016   K 3.4 (L) 01/13/2016   CL 100 (L) 01/13/2016   CO2 27 01/13/2016   GLUCOSE 130 (H) 01/13/2016   BUN 29 (H) 01/13/2016   CREATININE 3.64 (H) 01/13/2016   CALCIUM 9.0 01/13/2016   PROT 6.7 01/13/2016   ALBUMIN 3.1 (L) 01/13/2016   AST 29 01/13/2016   ALT 13 (L) 01/13/2016   ALKPHOS 102 01/13/2016   BILITOT 1.3 (H) 01/13/2016   GFRNONAA 13 (L) 01/13/2016   GFRAA 16 (L) 01/13/2016    Lab Results  Component Value Date   WBC 5.9 01/13/2016   NEUTROABS 4.1 01/13/2016   HGB 8.0 (L) 01/13/2016   HCT 23.5 (L) 01/13/2016   MCV 90.8 01/13/2016   PLT 95 (L) 01/13/2016   Lab Results  Component Value Date   TOTALPROTELP 6.5 12/30/2015   ALBUMINELP 3.0 12/30/2015   A1GS 0.3 12/30/2015   A2GS 1.1 (H) 12/30/2015   BETS 1.3 12/30/2015   GAMS 0.8 12/30/2015   MSPIKE 0.6 (H) 12/30/2015   SPEI Comment 12/30/2015     STUDIES: Ct Abdomen Pelvis Wo Contrast  Result Date: 01/01/2016 CLINICAL DATA:  Right flank pain for 1 week. No hematuria. History multiple myeloma. EXAM: CT ABDOMEN AND PELVIS WITHOUT CONTRAST TECHNIQUE: Multidetector CT imaging of the abdomen and pelvis was performed following the standard protocol without IV contrast. COMPARISON:  11/14/2015 CT FINDINGS: Lower  chest: Bilateral small pleural effusions with compressive atelectasis are identified stable on the right and increased on the left in the interval. Small right lower lobe calcified granuloma on series 2, image 13. Hepatobiliary: The unenhanced liver is unremarkable. Contracted appearing gallbladder which is slightly thickened in appearance as a result. There is a trace amount of free fluid/ascites overlying the right hepatic lobe. Pancreas: Slight decrease in peripancreatic inflammation since prior. Stranding along Gerota's fascia is present. No pseudocyst formation. No ductal dilatation. Spleen: Normal size spleen. Adrenals/Urinary Tract: The adrenal glands are normal. Mild nonspecific perinephric fat stranding is noted bilaterally. There is mild fullness of both renal collecting systems without obstructing calculi. There is no nephrolithiasis. No hydroureter. Findings may be related to chronic ectasia of the renal collecting systems bilaterally. Stomach/Bowel: Normal bowel rotation. No bowel obstruction. Moderate colonic stool burden. Vascular/Lymphatic: Minimal atherosclerosis of the normal-sized abdominal aorta and right internal iliac arteries. Small phleboliths are  seen bilaterally in the lower pelvis. Reproductive: There may be a small nabothian cyst or fibroid in the lower uterine segment series 2, image 79 accounting for hypodensity measuring 6 mm. Otherwise negative. Other: Moderate amount of free fluid in the pelvis increased since the interim. Musculoskeletal: Mixed ostia sclerotic and osteolytic abnormality is along the visualized dorsal spine, pelvis and sternum consistent with known history multiple myeloma. Stable T11 compression. IMPRESSION: 1. Mildly dilated renal collecting systems bilaterally without obstructing calculi nor hydroureter. Findings may be due to chronic ectasia of the renal collecting system. 2. Slight interval increase in left pleural effusion with stable right small pleural  effusion. 3. Slight decrease in peripancreatic inflammation. The increase in free fluid within the abdomen and pelvis may be sequela of previously described pancreatitis. 4. Diffuse sclerosis in numerous lytic lesions throughout the visualized osseous elements with chronic height loss of T11. This reflects the patient's known multiple myeloma. Electronically Signed   By: Ashley Royalty M.D.   On: 01/01/2016 14:46    ASSESSMENT: Multiple Myeloma in relapse with multiple bony lytic lesions, end-stage renal disease.   PLAN:    1. Multiple Myeloma in relapse with multiple bony lytic lesions: MRI, CT, bone scan results reviewed independently indicating progression of disease. Patient also has progressed end-stage renal disease, likely secondary to underlying myeloma. Patient's performance status has significantly declined, but she wishes to continue with aggressive treatment. Patient's kappa free light chain has trended down and is now 18,408. Despite thrombocytopenia, proceed with cycle 1, day 8 of Kyporlis. If tolerated, patient will receive treatment on days 1, 2, 8, 9, 15, and 16. Kyporlis does not need to be dose reduced in the setting of end-stage renal disease and dialysis. Can consider Delton See given her renal failure. Dexamethasone has been discontinued given her difficult to control blood sugar. Because of patient's immigration status, she could not undergo transplant. Return to clinic in 1 week for consideration of cycle 1, day 15. 2. Back pain:  Continue current narcotics as prescribed. 3. End-stage renal disease: Likely secondary to progression of disease. Continue dialysis as above. 4. Anemia: Improved with blood transfusion last week. Monitor. 5. Hyperglycemia: Patient's blood glucose has improved since initiating insulin. Continue monitoring and treatment by primary care.  6. Thrombocytopenia: Multifactorial secondary to treatment as well as disease progression. Improved.  The entire visit was  done in the presence of an interpreter.  Patient expressed understanding and was in agreement with this plan. She also understands that She can call clinic at any time with any questions, concerns, or complaints.    Lloyd Huger, MD   01/14/2016 9:03 AM   ROS

## 2016-01-12 ENCOUNTER — Encounter (INDEPENDENT_AMBULATORY_CARE_PROVIDER_SITE_OTHER): Payer: Self-pay | Admitting: Vascular Surgery

## 2016-01-13 ENCOUNTER — Inpatient Hospital Stay: Payer: Self-pay

## 2016-01-13 ENCOUNTER — Inpatient Hospital Stay (HOSPITAL_BASED_OUTPATIENT_CLINIC_OR_DEPARTMENT_OTHER): Payer: Self-pay | Admitting: Oncology

## 2016-01-13 VITALS — BP 134/67 | HR 95 | Temp 97.4°F | Resp 18 | Wt 108.6 lb

## 2016-01-13 DIAGNOSIS — Z8701 Personal history of pneumonia (recurrent): Secondary | ICD-10-CM

## 2016-01-13 DIAGNOSIS — C9002 Multiple myeloma in relapse: Secondary | ICD-10-CM

## 2016-01-13 DIAGNOSIS — I120 Hypertensive chronic kidney disease with stage 5 chronic kidney disease or end stage renal disease: Secondary | ICD-10-CM

## 2016-01-13 DIAGNOSIS — D696 Thrombocytopenia, unspecified: Secondary | ICD-10-CM

## 2016-01-13 DIAGNOSIS — F329 Major depressive disorder, single episode, unspecified: Secondary | ICD-10-CM

## 2016-01-13 DIAGNOSIS — E1122 Type 2 diabetes mellitus with diabetic chronic kidney disease: Secondary | ICD-10-CM

## 2016-01-13 DIAGNOSIS — R5383 Other fatigue: Secondary | ICD-10-CM

## 2016-01-13 DIAGNOSIS — N186 End stage renal disease: Secondary | ICD-10-CM

## 2016-01-13 DIAGNOSIS — J90 Pleural effusion, not elsewhere classified: Secondary | ICD-10-CM

## 2016-01-13 DIAGNOSIS — R531 Weakness: Secondary | ICD-10-CM

## 2016-01-13 DIAGNOSIS — M1990 Unspecified osteoarthritis, unspecified site: Secondary | ICD-10-CM

## 2016-01-13 DIAGNOSIS — Z79899 Other long term (current) drug therapy: Secondary | ICD-10-CM

## 2016-01-13 DIAGNOSIS — R630 Anorexia: Secondary | ICD-10-CM

## 2016-01-13 DIAGNOSIS — M549 Dorsalgia, unspecified: Secondary | ICD-10-CM

## 2016-01-13 DIAGNOSIS — Z992 Dependence on renal dialysis: Secondary | ICD-10-CM

## 2016-01-13 LAB — CBC WITH DIFFERENTIAL/PLATELET
BASOS ABS: 0 10*3/uL (ref 0–0.1)
BASOS PCT: 1 %
EOS ABS: 0 10*3/uL (ref 0–0.7)
EOS PCT: 1 %
HCT: 23.5 % — ABNORMAL LOW (ref 35.0–47.0)
Hemoglobin: 8 g/dL — ABNORMAL LOW (ref 12.0–16.0)
LYMPHS PCT: 14 %
Lymphs Abs: 0.8 10*3/uL — ABNORMAL LOW (ref 1.0–3.6)
MCH: 30.9 pg (ref 26.0–34.0)
MCHC: 34 g/dL (ref 32.0–36.0)
MCV: 90.8 fL (ref 80.0–100.0)
Monocytes Absolute: 0.9 10*3/uL (ref 0.2–0.9)
Monocytes Relative: 15 %
Neutro Abs: 4.1 10*3/uL (ref 1.4–6.5)
Neutrophils Relative %: 69 %
PLATELETS: 95 10*3/uL — AB (ref 150–440)
RBC: 2.58 MIL/uL — AB (ref 3.80–5.20)
RDW: 19.7 % — ABNORMAL HIGH (ref 11.5–14.5)
WBC: 5.9 10*3/uL (ref 3.6–11.0)

## 2016-01-13 LAB — COMPREHENSIVE METABOLIC PANEL
ALBUMIN: 3.1 g/dL — AB (ref 3.5–5.0)
ALT: 13 U/L — ABNORMAL LOW (ref 14–54)
ANION GAP: 10 (ref 5–15)
AST: 29 U/L (ref 15–41)
Alkaline Phosphatase: 102 U/L (ref 38–126)
BILIRUBIN TOTAL: 1.3 mg/dL — AB (ref 0.3–1.2)
BUN: 29 mg/dL — ABNORMAL HIGH (ref 6–20)
CALCIUM: 9 mg/dL (ref 8.9–10.3)
CO2: 27 mmol/L (ref 22–32)
Chloride: 100 mmol/L — ABNORMAL LOW (ref 101–111)
Creatinine, Ser: 3.64 mg/dL — ABNORMAL HIGH (ref 0.44–1.00)
GFR calc non Af Amer: 13 mL/min — ABNORMAL LOW (ref >60)
GFR, EST AFRICAN AMERICAN: 16 mL/min — AB (ref >60)
GLUCOSE: 130 mg/dL — AB (ref 65–99)
POTASSIUM: 3.4 mmol/L — AB (ref 3.5–5.1)
Sodium: 137 mmol/L (ref 135–145)
TOTAL PROTEIN: 6.7 g/dL (ref 6.5–8.1)

## 2016-01-13 MED ORDER — PROCHLORPERAZINE MALEATE 10 MG PO TABS
10.0000 mg | ORAL_TABLET | Freq: Once | ORAL | Status: AC
  Administered 2016-01-13: 10 mg via ORAL

## 2016-01-13 MED ORDER — HEPARIN SOD (PORK) LOCK FLUSH 100 UNIT/ML IV SOLN
500.0000 [IU] | Freq: Once | INTRAVENOUS | Status: AC
  Administered 2016-01-13: 500 [IU] via INTRAVENOUS
  Filled 2016-01-13: qty 5

## 2016-01-13 MED ORDER — SODIUM CHLORIDE 0.9 % IJ SOLN
10.0000 mL | Freq: Once | INTRAMUSCULAR | Status: AC
  Administered 2016-01-13: 10 mL via INTRAVENOUS
  Filled 2016-01-13: qty 10

## 2016-01-13 MED ORDER — DEXTROSE 5 % IV SOLN
20.0000 mg/m2 | Freq: Once | INTRAVENOUS | Status: AC
  Administered 2016-01-13: 30 mg via INTRAVENOUS
  Filled 2016-01-13: qty 15

## 2016-01-13 MED ORDER — SODIUM CHLORIDE 0.9 % IV SOLN
Freq: Once | INTRAVENOUS | Status: AC
  Administered 2016-01-13: 10:00:00 via INTRAVENOUS
  Filled 2016-01-13: qty 1000

## 2016-01-13 NOTE — Progress Notes (Signed)
States is feeling nauseated this morning. Has swelling in both legs that started last Friday.

## 2016-01-13 NOTE — Progress Notes (Signed)
Patient receiving treatment today per Dr. Grayland Ormond. Jamie Keller

## 2016-01-14 ENCOUNTER — Other Ambulatory Visit: Payer: Self-pay

## 2016-01-14 ENCOUNTER — Inpatient Hospital Stay
Admission: EM | Admit: 2016-01-14 | Discharge: 2016-01-15 | DRG: 193 | Disposition: A | Discharge status: Home / Self Care | Payer: Self-pay | Attending: Internal Medicine | Admitting: Internal Medicine

## 2016-01-14 ENCOUNTER — Emergency Department: Payer: Self-pay

## 2016-01-14 ENCOUNTER — Inpatient Hospital Stay: Payer: Self-pay

## 2016-01-14 VITALS — BP 132/68 | HR 96 | Temp 97.1°F | Resp 18

## 2016-01-14 DIAGNOSIS — D631 Anemia in chronic kidney disease: Secondary | ICD-10-CM | POA: Diagnosis present

## 2016-01-14 DIAGNOSIS — J9600 Acute respiratory failure, unspecified whether with hypoxia or hypercapnia: Secondary | ICD-10-CM | POA: Diagnosis present

## 2016-01-14 DIAGNOSIS — N186 End stage renal disease: Secondary | ICD-10-CM | POA: Diagnosis present

## 2016-01-14 DIAGNOSIS — Y95 Nosocomial condition: Secondary | ICD-10-CM | POA: Diagnosis present

## 2016-01-14 DIAGNOSIS — J189 Pneumonia, unspecified organism: Secondary | ICD-10-CM

## 2016-01-14 DIAGNOSIS — M1990 Unspecified osteoarthritis, unspecified site: Secondary | ICD-10-CM | POA: Diagnosis present

## 2016-01-14 DIAGNOSIS — C9000 Multiple myeloma not having achieved remission: Secondary | ICD-10-CM | POA: Diagnosis present

## 2016-01-14 DIAGNOSIS — R0602 Shortness of breath: Secondary | ICD-10-CM

## 2016-01-14 DIAGNOSIS — C9002 Multiple myeloma in relapse: Secondary | ICD-10-CM

## 2016-01-14 DIAGNOSIS — Z794 Long term (current) use of insulin: Secondary | ICD-10-CM

## 2016-01-14 DIAGNOSIS — Z992 Dependence on renal dialysis: Secondary | ICD-10-CM

## 2016-01-14 DIAGNOSIS — N2581 Secondary hyperparathyroidism of renal origin: Secondary | ICD-10-CM | POA: Diagnosis present

## 2016-01-14 DIAGNOSIS — I120 Hypertensive chronic kidney disease with stage 5 chronic kidney disease or end stage renal disease: Secondary | ICD-10-CM | POA: Diagnosis present

## 2016-01-14 DIAGNOSIS — J159 Unspecified bacterial pneumonia: Principal | ICD-10-CM | POA: Diagnosis present

## 2016-01-14 DIAGNOSIS — A419 Sepsis, unspecified organism: Secondary | ICD-10-CM

## 2016-01-14 DIAGNOSIS — E1122 Type 2 diabetes mellitus with diabetic chronic kidney disease: Secondary | ICD-10-CM | POA: Diagnosis present

## 2016-01-14 DIAGNOSIS — I248 Other forms of acute ischemic heart disease: Secondary | ICD-10-CM | POA: Diagnosis present

## 2016-01-14 DIAGNOSIS — R1013 Epigastric pain: Secondary | ICD-10-CM

## 2016-01-14 DIAGNOSIS — Z79899 Other long term (current) drug therapy: Secondary | ICD-10-CM

## 2016-01-14 LAB — COMPREHENSIVE METABOLIC PANEL
ALK PHOS: 123 U/L (ref 38–126)
ALT: 14 U/L (ref 14–54)
AST: 31 U/L (ref 15–41)
Albumin: 3 g/dL — ABNORMAL LOW (ref 3.5–5.0)
Anion gap: 14 (ref 5–15)
BILIRUBIN TOTAL: 1 mg/dL (ref 0.3–1.2)
BUN: 39 mg/dL — ABNORMAL HIGH (ref 6–20)
CALCIUM: 8.9 mg/dL (ref 8.9–10.3)
CO2: 23 mmol/L (ref 22–32)
CREATININE: 5.04 mg/dL — AB (ref 0.44–1.00)
Chloride: 98 mmol/L — ABNORMAL LOW (ref 101–111)
GFR, EST AFRICAN AMERICAN: 11 mL/min — AB (ref >60)
GFR, EST NON AFRICAN AMERICAN: 9 mL/min — AB (ref >60)
Glucose, Bld: 153 mg/dL — ABNORMAL HIGH (ref 65–99)
Potassium: 3.9 mmol/L (ref 3.5–5.1)
SODIUM: 135 mmol/L (ref 135–145)
TOTAL PROTEIN: 6.7 g/dL (ref 6.5–8.1)

## 2016-01-14 LAB — CBC WITH DIFFERENTIAL/PLATELET
BASOS ABS: 0.1 10*3/uL (ref 0–0.1)
Basophils Relative: 1 %
EOS PCT: 0 %
Eosinophils Absolute: 0 10*3/uL (ref 0–0.7)
HCT: 24.5 % — ABNORMAL LOW (ref 35.0–47.0)
Hemoglobin: 8.1 g/dL — ABNORMAL LOW (ref 12.0–16.0)
LYMPHS ABS: 0.7 10*3/uL — AB (ref 1.0–3.6)
LYMPHS PCT: 8 %
MCH: 30.9 pg (ref 26.0–34.0)
MCHC: 33.2 g/dL (ref 32.0–36.0)
MCV: 92.9 fL (ref 80.0–100.0)
MONO ABS: 1.1 10*3/uL — AB (ref 0.2–0.9)
Monocytes Relative: 13 %
Neutro Abs: 6.5 10*3/uL (ref 1.4–6.5)
Neutrophils Relative %: 78 %
PLATELETS: 104 10*3/uL — AB (ref 150–440)
RBC: 2.64 MIL/uL — ABNORMAL LOW (ref 3.80–5.20)
RDW: 19.9 % — AB (ref 11.5–14.5)
WBC: 8.3 10*3/uL (ref 3.6–11.0)

## 2016-01-14 LAB — LIPASE, BLOOD: Lipase: 26 U/L (ref 11–51)

## 2016-01-14 LAB — PROTIME-INR
INR: 1.51
Prothrombin Time: 18.4 seconds — ABNORMAL HIGH (ref 11.4–15.2)

## 2016-01-14 LAB — EXPECTORATED SPUTUM ASSESSMENT W GRAM STAIN, RFLX TO RESP C

## 2016-01-14 LAB — PROTEIN ELECTROPHORESIS, SERUM
A/G Ratio: 1.1 (ref 0.7–1.7)
ALBUMIN ELP: 3.1 g/dL (ref 2.9–4.4)
ALPHA-1-GLOBULIN: 0.4 g/dL (ref 0.0–0.4)
Alpha-2-Globulin: 1 g/dL (ref 0.4–1.0)
Beta Globulin: 1 g/dL (ref 0.7–1.3)
GAMMA GLOBULIN: 0.6 g/dL (ref 0.4–1.8)
GLOBULIN, TOTAL: 2.9 g/dL (ref 2.2–3.9)
M-SPIKE, %: 0.3 g/dL — AB
TOTAL PROTEIN ELP: 6 g/dL (ref 6.0–8.5)

## 2016-01-14 LAB — EXPECTORATED SPUTUM ASSESSMENT W REFEX TO RESP CULTURE

## 2016-01-14 LAB — URINALYSIS COMPLETE WITH MICROSCOPIC (ARMC ONLY)
Bacteria, UA: NONE SEEN
Bilirubin Urine: NEGATIVE
Glucose, UA: 50 mg/dL — AB
Hgb urine dipstick: NEGATIVE
KETONES UR: NEGATIVE mg/dL
LEUKOCYTES UA: NEGATIVE
Nitrite: NEGATIVE
PH: 8 (ref 5.0–8.0)
PROTEIN: 100 mg/dL — AB
SPECIFIC GRAVITY, URINE: 1.006 (ref 1.005–1.030)

## 2016-01-14 LAB — POCT PREGNANCY, URINE: PREG TEST UR: NEGATIVE

## 2016-01-14 LAB — KAPPA/LAMBDA LIGHT CHAINS
KAPPA FREE LGHT CHN: 13992.9 mg/L — AB (ref 3.3–19.4)
KAPPA, LAMDA LIGHT CHAIN RATIO: 813.54 — AB (ref 0.26–1.65)
Lambda free light chains: 17.2 mg/L (ref 5.7–26.3)

## 2016-01-14 LAB — INFLUENZA PANEL BY PCR (TYPE A & B)
INFLAPCR: NEGATIVE
INFLBPCR: NEGATIVE

## 2016-01-14 LAB — GLUCOSE, CAPILLARY: GLUCOSE-CAPILLARY: 133 mg/dL — AB (ref 65–99)

## 2016-01-14 LAB — TROPONIN I: TROPONIN I: 0.09 ng/mL — AB (ref <0.03)

## 2016-01-14 LAB — APTT: aPTT: 128 seconds — ABNORMAL HIGH (ref 24–36)

## 2016-01-14 LAB — LACTIC ACID, PLASMA: Lactic Acid, Venous: 1.7 mmol/L (ref 0.5–1.9)

## 2016-01-14 MED ORDER — SODIUM CHLORIDE 0.9 % IV SOLN
Freq: Once | INTRAVENOUS | Status: AC
  Administered 2016-01-14: 09:00:00 via INTRAVENOUS
  Filled 2016-01-14: qty 1000

## 2016-01-14 MED ORDER — HEPARIN SODIUM (PORCINE) 5000 UNIT/ML IJ SOLN
5000.0000 [IU] | Freq: Three times a day (TID) | INTRAMUSCULAR | Status: DC
  Administered 2016-01-14 – 2016-01-15 (×2): 5000 [IU] via SUBCUTANEOUS
  Filled 2016-01-14 (×2): qty 1

## 2016-01-14 MED ORDER — FUROSEMIDE 40 MG PO TABS: 80.0000 mg | ORAL_TABLET | Freq: Every day | ORAL | Status: DC

## 2016-01-14 MED ORDER — LEVOFLOXACIN IN D5W 500 MG/100ML IV SOLN
500.0000 mg | Freq: Once | INTRAVENOUS | Status: AC
Start: 2016-01-14 — End: 2016-01-14
  Administered 2016-01-14: 500 mg via INTRAVENOUS
  Filled 2016-01-14: qty 100

## 2016-01-14 MED ORDER — IPRATROPIUM-ALBUTEROL 0.5-2.5 (3) MG/3ML IN SOLN
3.0000 mL | Freq: Once | RESPIRATORY_TRACT | Status: AC
  Administered 2016-01-14: 3 mL via RESPIRATORY_TRACT

## 2016-01-14 MED ORDER — AZITHROMYCIN 250 MG PO TABS: 250.0000 mg | ORAL_TABLET | Freq: Every day | ORAL | Status: DC

## 2016-01-14 MED ORDER — DEXTROSE 5 % IV SOLN
20.0000 mg/m2 | Freq: Once | INTRAVENOUS | Status: AC
  Administered 2016-01-14: 30 mg via INTRAVENOUS
  Filled 2016-01-14: qty 15

## 2016-01-14 MED ORDER — HEPARIN SOD (PORK) LOCK FLUSH 100 UNIT/ML IV SOLN
500.0000 [IU] | Freq: Once | INTRAVENOUS | Status: AC
  Administered 2016-01-14: 500 [IU] via INTRAVENOUS

## 2016-01-14 MED ORDER — SODIUM CHLORIDE 0.9 % IJ SOLN
10.0000 mL | Freq: Once | INTRAMUSCULAR | Status: AC
  Administered 2016-01-14: 10 mL via INTRAVENOUS
  Filled 2016-01-14: qty 10

## 2016-01-14 MED ORDER — GABAPENTIN 100 MG PO CAPS
100.0000 mg | ORAL_CAPSULE | Freq: Three times a day (TID) | ORAL | Status: DC
  Administered 2016-01-14 – 2016-01-15 (×3): 100 mg via ORAL
  Filled 2016-01-14 (×3): qty 1

## 2016-01-14 MED ORDER — PIPERACILLIN-TAZOBACTAM 3.375 G IVPB 30 MIN
3.3750 g | Freq: Once | INTRAVENOUS | Status: AC
  Administered 2016-01-14: 3.375 g via INTRAVENOUS
  Filled 2016-01-14: qty 50

## 2016-01-14 MED ORDER — ACETAMINOPHEN 325 MG PO TABS: 650.0000 mg | ORAL_TABLET | Freq: Four times a day (QID) | ORAL | Status: DC | PRN

## 2016-01-14 MED ORDER — VERAPAMIL HCL 40 MG PO TABS
40.0000 mg | ORAL_TABLET | Freq: Two times a day (BID) | ORAL | Status: DC
  Administered 2016-01-15: 40 mg via ORAL
  Filled 2016-01-14 (×3): qty 1

## 2016-01-14 MED ORDER — FERROUS SULFATE 325 (65 FE) MG PO TABS
325.0000 mg | ORAL_TABLET | Freq: Every day | ORAL | Status: DC
  Administered 2016-01-15: 09:00:00 325 mg via ORAL
  Filled 2016-01-14: qty 1

## 2016-01-14 MED ORDER — HEPARIN SOD (PORK) LOCK FLUSH 100 UNIT/ML IV SOLN
INTRAVENOUS | Status: AC
  Filled 2016-01-14: qty 5

## 2016-01-14 MED ORDER — FAMOTIDINE IN NACL 20-0.9 MG/50ML-% IV SOLN
20.0000 mg | Freq: Two times a day (BID) | INTRAVENOUS | Status: DC
  Administered 2016-01-14: 23:00:00 20 mg via INTRAVENOUS
  Filled 2016-01-14 (×2): qty 50

## 2016-01-14 MED ORDER — ALPRAZOLAM 0.5 MG PO TABS
0.5000 mg | ORAL_TABLET | Freq: Once | ORAL | Status: AC
  Administered 2016-01-15: 0.5 mg via ORAL
  Filled 2016-01-14: qty 1

## 2016-01-14 MED ORDER — KETOROLAC TROMETHAMINE 30 MG/ML IJ SOLN
15.0000 mg | INTRAMUSCULAR | Status: AC
  Administered 2016-01-14: 15 mg via INTRAVENOUS
  Filled 2016-01-14: qty 1

## 2016-01-14 MED ORDER — VANCOMYCIN HCL IN DEXTROSE 1-5 GM/200ML-% IV SOLN
1000.0000 mg | Freq: Once | INTRAVENOUS | Status: AC
  Administered 2016-01-14: 1000 mg via INTRAVENOUS
  Filled 2016-01-14: qty 200

## 2016-01-14 MED ORDER — ACETAMINOPHEN 650 MG RE SUPP: 650.0000 mg | Freq: Four times a day (QID) | RECTAL | Status: DC | PRN

## 2016-01-14 MED ORDER — SODIUM CHLORIDE 0.9 % IV BOLUS (SEPSIS)
500.0000 mL | Freq: Once | INTRAVENOUS | Status: AC
  Administered 2016-01-14: 500 mL via INTRAVENOUS

## 2016-01-14 MED ORDER — INSULIN ASPART 100 UNIT/ML ~~LOC~~ SOLN: 0.0000 [IU] | Freq: Every day | SUBCUTANEOUS | Status: DC

## 2016-01-14 MED ORDER — AZITHROMYCIN 500 MG PO TABS
500.0000 mg | ORAL_TABLET | Freq: Every day | ORAL | Status: AC
  Administered 2016-01-15: 500 mg via ORAL
  Filled 2016-01-14: qty 1

## 2016-01-14 MED ORDER — ACETAMINOPHEN 325 MG PO TABS
650.0000 mg | ORAL_TABLET | Freq: Once | ORAL | Status: AC
  Administered 2016-01-14: 650 mg via ORAL
  Filled 2016-01-14: qty 2

## 2016-01-14 MED ORDER — PROCHLORPERAZINE MALEATE 10 MG PO TABS
10.0000 mg | ORAL_TABLET | Freq: Once | ORAL | Status: AC
  Administered 2016-01-14: 10 mg via ORAL
  Filled 2016-01-14: qty 1

## 2016-01-14 MED ORDER — VANCOMYCIN HCL 500 MG IV SOLR
500.0000 mg | INTRAVENOUS | Status: DC | PRN
  Filled 2016-01-14: qty 500

## 2016-01-14 MED ORDER — PIPERACILLIN-TAZOBACTAM 3.375 G IVPB: 3.3750 g | Freq: Two times a day (BID) | INTRAVENOUS | Status: DC

## 2016-01-14 MED ORDER — INSULIN GLARGINE 100 UNIT/ML ~~LOC~~ SOLN
5.0000 [IU] | Freq: Every day | SUBCUTANEOUS | Status: DC
  Administered 2016-01-15: 5 [IU] via SUBCUTANEOUS
  Filled 2016-01-14 (×2): qty 0.05

## 2016-01-14 MED ORDER — PROMETHAZINE HCL 25 MG PO TABS: 25.0000 mg | ORAL_TABLET | Freq: Four times a day (QID) | ORAL | Status: DC | PRN

## 2016-01-14 MED ORDER — OXYCODONE HCL 5 MG PO TABS: 10.0000 mg | ORAL_TABLET | Freq: Four times a day (QID) | ORAL | Status: DC | PRN

## 2016-01-14 MED ORDER — SODIUM CHLORIDE 0.9 % IV BOLUS (SEPSIS)
1000.0000 mL | Freq: Once | INTRAVENOUS | Status: AC
  Administered 2016-01-14: 1000 mL via INTRAVENOUS

## 2016-01-14 MED ORDER — INSULIN ASPART 100 UNIT/ML ~~LOC~~ SOLN
0.0000 [IU] | Freq: Three times a day (TID) | SUBCUTANEOUS | Status: DC
  Administered 2016-01-15: 2 [IU] via SUBCUTANEOUS
  Filled 2016-01-14: qty 2

## 2016-01-14 MED ORDER — BENZONATATE 100 MG PO CAPS: 200.0000 mg | ORAL_CAPSULE | Freq: Three times a day (TID) | ORAL | Status: DC | PRN

## 2016-01-14 NOTE — ED Provider Notes (Signed)
[facility] Emergency Department Provider Note  ____________________________________________  Time seen: Approximately 6:25 PM  I have reviewed the triage vital signs and the nursing notes.   HISTORY  Chief Complaint Shortness of breath  Encounter completed with Spanish interpreter available at bedside   HPI Jamie Keller is a 50 y.o. female who comes to the ED from dialysis due to shortness of breath..Patient reports shortness of breath has been going on for about a week, associated with nonproductive cough. Also reports right upper quadrant abdominal pain and sometimes pain after eating. Has had a few episodes of vomiting as well. She went to dialysis today as she goes Monday was a Friday, but was unable to complete the session due to her symptoms.     Past Medical History:  Diagnosis Date  . Diabetes mellitus without complication ([place])   . Hypertension   . Multiple myeloma ([place])   . Osteoarthritis   . Pneumonia   . Post-menopausal 2014  . Sepsis [facility])      Patient Active Problem List   Diagnosis Date Noted  . Sepsis ([place]) 12/01/2015  . HCAP (healthcare-associated pneumonia) 12/01/2015  . End stage renal failure on dialysis ([place]) 12/01/2015  . HTN (hypertension) 12/01/2015  . Depression 12/01/2015  . Acute respiratory failure ([place]) 11/14/2015  . Acute renal failure ([place])   . Multiple myeloma in relapse ([place]) 09/06/2015  . Post-menopausal   . Bulging lumbar disc 05/28/2014     Past Surgical History:  Procedure Laterality Date  . CESAREAN SECTION     x2  . PERIPHERAL VASCULAR CATHETERIZATION N/A 11/19/2015   Procedure: Dialysis/Perma Catheter Insertion;  Surgeon: Algernon Huxley, MD;  Location: [facility];  Service: Cardiovascular;  Laterality: N/A;     Prior to Admission medications   Medication Sig Start Date End Date Taking? Authorizing Provider  benzonatate (TESSALON) 200 MG capsule Take 1 capsule (200 mg total) by  mouth 3 (three) times daily as needed for cough. 12/30/15   Lloyd Huger, MD  ferrous sulfate 325 (65 FE) MG tablet Take 325 mg by mouth daily with breakfast.    Historical Provider, MD  furosemide (LASIX) 80 MG tablet Take 1 tablet (80 mg total) by mouth daily. 11/29/15   Srikar Sudini, MD  gabapentin (NEURONTIN) 100 MG capsule Take 100 mg by mouth 3 (three) times daily.    Historical Provider, MD  Oxycodone HCl 10 MG TABS Take 1 tablet (10 mg total) by mouth every 6 (six) hours as needed. Patient taking differently: Take 10 mg by mouth every 6 (six) hours as needed (pain).  10/03/15   Lloyd Huger, MD  promethazine (PHENERGAN) 25 MG tablet Take 25 mg by mouth every 6 (six) hours as needed for nausea or vomiting.    Historical Provider, MD  verapamil (CALAN) 40 MG tablet Take 1 tablet (40 mg total) by mouth 2 (two) times daily. 11/28/15   Hillary Bow, MD     Allergies No known allergies   Family History  Problem Relation Age of Onset  . Hypercholesterolemia Mother   . Hypertension Father     Social History Social History  Substance Use Topics  . Smoking status: Never Smoker  . Smokeless tobacco: Never Used  . Alcohol use No    Review of Systems  Constitutional:   Positive fever.  ENT:   No sore throat. No rhinorrhea. Cardiovascular:   No chest pain. Respiratory:   Positive shortness of breath and cough. Gastrointestinal:  Positive right upper quadrant abdominal pain, 10-point ROS otherwise negative.  ____________________________________________   PHYSICAL EXAM:  VITAL SIGNS: ED Triage Vitals  Enc Vitals Group     BP 01/14/16 1614 (!) 184/80     Pulse Rate 01/14/16 1614 (!) 140     Resp 01/14/16 1614 (!) 33     Temp 01/14/16 1614 (!) 102.1 F (38.9 C)     Temp Source 01/14/16 1614 Oral     SpO2 01/14/16 1614 100 %     Weight 01/14/16 1615 108 lb (49 kg)     Height --      Head Circumference --      Peak Flow --      Pain Score 01/14/16 1615 0      Pain Loc --      Pain Edu? --      Excl. in [place]? --     Vital signs reviewed, nursing assessments reviewed.   Constitutional:   Alert and oriented. Ill-appearing. Eyes:   No scleral icterus. No conjunctival pallor. PERRL. EOMI.  No nystagmus. ENT   Head:   Normocephalic and atraumatic.   Nose:   No congestion/rhinnorhea. No septal hematoma   Mouth/Throat:   Dry mucous membranes, no pharyngeal erythema. No peritonsillar mass.    Neck:   No stridor. No SubQ emphysema. No meningismus. Hematological/Lymphatic/Immunilogical:   No cervical lymphadenopathy. Cardiovascular:   Tachycardia heart rate 140. Symmetric bilateral radial and DP pulses.  No murmurs.  Respiratory:   Tachypnea, diffuse crackles. Gastrointestinal:   Soft with right upper quadrant tenderness. Non distended. There is no CVA tenderness.  No rebound, rigidity, or guarding. Genitourinary:   deferred Musculoskeletal:   Nontender with normal range of motion in all extremities. No joint effusions.  No lower extremity tenderness.  No edema. Neurologic:   Normal speech and language.  CN 2-10 normal. Motor grossly intact. No gross focal neurologic deficits are appreciated.  Skin:    Skin is warm, dry and intact. No rash noted.  No petechiae, purpura, or bullae.  ____________________________________________    LABS (pertinent positives/negatives) (all labs ordered are listed, but only abnormal results are displayed) Labs Reviewed  COMPREHENSIVE METABOLIC PANEL - Abnormal; Notable for the following:       Result Value   Chloride 98 (*)    Glucose, Bld 153 (*)    BUN 39 (*)    Creatinine, Ser 5.04 (*)    Albumin 3.0 (*)    GFR calc non Af Amer 9 (*)    GFR calc Af Amer 11 (*)    All other components within normal limits  TROPONIN I - Abnormal; Notable for the following:    Troponin I 0.09 (*)    All other components within normal limits  CBC WITH DIFFERENTIAL/PLATELET - Abnormal; Notable for the following:     RBC 2.64 (*)    Hemoglobin 8.1 (*)    HCT 24.5 (*)    RDW 19.9 (*)    Platelets 104 (*)    Lymphs Abs 0.7 (*)    Monocytes Absolute 1.1 (*)    All other components within normal limits  APTT - Abnormal; Notable for the following:    aPTT 128 (*)    All other components within normal limits  PROTIME-INR - Abnormal; Notable for the following:    Prothrombin Time 18.4 (*)    All other components within normal limits  URINALYSIS COMPLETEWITH MICROSCOPIC ([facility] ONLY) - Abnormal; Notable for the following:    Color,  Urine STRAW (*)    APPearance CLEAR (*)    Glucose, UA 50 (*)    Protein, ur 100 (*)    Squamous Epithelial / LPF 0-5 (*)    All other components within normal limits  CULTURE, BLOOD (ROUTINE X 2)  CULTURE, BLOOD (ROUTINE X 2)  CULTURE, EXPECTORATED SPUTUM-ASSESSMENT  URINE CULTURE  LACTIC ACID, PLASMA  LIPASE, BLOOD  INFLUENZA PANEL BY PCR (TYPE A & B, H1N1)  POCT PREGNANCY, URINE   ____________________________________________   EKG  Interpreted by me Sinus tachycardia rate 143. Normal axis and intervals. Normal QRS ST segments and T waves. No acute ischemic changes  ____________________________________________    RADIOLOGY  Chest x-ray shows diffuse interstitial edema. Ultrasound right upper quadrant: Mild gallbladder wall thickening with pericholecystic fluid with no evidence of stones. The findings may reflect acalculous cholecystitis. A nuclear medicine hepatobiliary scan with gallbladder ejection fraction determination may be useful.  Increased hepatic echotexture likely reflects fatty infiltrative change. No focal hepatic mass.  Incidental note is made of a right pleural effusion. ____________________________________________   PROCEDURES Procedures CRITICAL CARE Performed by: Joni Fears, Lanette Ell   Total critical care time: 35 minutes  Critical care time was exclusive of separately billable procedures and treating other  patients.  Critical care was necessary to treat or prevent imminent or life-threatening deterioration.  Critical care was time spent personally by me on the following activities: development of treatment plan with patient and/or surrogate as well as nursing, discussions with consultants, evaluation of patient's response to treatment, examination of patient, obtaining history from patient or surrogate, ordering and performing treatments and interventions, ordering and review of laboratory studies, ordering and review of radiographic studies, pulse oximetry and re-evaluation of patient's condition.  ____________________________________________   INITIAL IMPRESSION / ASSESSMENT AND PLAN / ED COURSE  Pertinent labs & imaging results that were available during my care of the patient were reviewed by me and considered in my medical decision making (see chart for details).  Patient presents with fever and severe tachycardia related to shortness of breath and abdominal pain. Code sepsis initiated immediately upon my assessment. Workup so far is significant for elevated troponin of 0.09. She has chronic anemia which is at baseline. Creatinine is elevated consistent with her end-stage renal disease. Ultrasound shows some mild gallbladder wall thickening with a small amount of pericholecystic fluid. This can be seen in a volume overload state versus a calculus cholecystitis. As her predominant symptoms appear to be shortness of breath and coughing, I think is more likely the patient has a healthcare associated pneumonia. I attempted to contact the surgeon on-call at 6:30 but then the operating room. I'll wait until her sugars changes 7:00 and then contact the oncoming surgeon for evaluation. Plan to hospitalize the patient for further sepsis management.   Clinical Course     ----------------------------------------- 7:21 PM on 01/14/2016 ----------------------------------------- Workup significant for  depression infiltrates on chest x-ray, favored to be interstitial edema by radiology. May be multifocal infiltrate, healthcare associated pneumonia. Added on Levaquin. Continue IV fluids. Heart rate is improving to 125 with fluids. Oxygen saturation remained stable. Case discussed with surgery for consultation regarding the ultrasound findings.  I think that her issues today are primarily respiratory. Case discussed with hospitalist for admission.  ____________________________________________   FINAL CLINICAL IMPRESSION(S) / ED DIAGNOSES  Final diagnoses:  Shortness of breath  HCAP (healthcare-associated pneumonia)  Sepsis, due to unspecified organism [facility])       Portions of this note were generated with  Lobbyist. Dictation errors may occur despite best attempts at proofreading.    Carrie Mew, MD 01/14/16 1925

## 2016-01-14 NOTE — ED Notes (Signed)
CODE  SEPSIS  CALLED  TO  THOMAS  AT  [facility] 

## 2016-01-14 NOTE — Progress Notes (Signed)
Pharmacy Antibiotic Note  Jamie Keller is a 50 y.o. female admitted on 01/14/2016 with sepsis.  Pharmacy has been consulted for vancomycin and piperacillin/tazobactam dosing.  Patient is an HD patient - normal schedule MWF. Came to ED from dialysis today and was unable to complete HD session today.  Plan: Piperacillin/tazobactam 3.375 g IV q12h EI  Vancomycin 1000 mg loading dose given in ED (20 mg/kg) Will order vancomycin 500 mg IV to be given at the end of dialysis - pharmacy to follow for HD schedule  Goal vancomycin trough 15-20 mcg/mL Vancomycin trough to be ordered prior to 3rd HD session  Weight: 108 lb (49 kg)  Temp (24hrs), Avg:99.3 F (37.4 C), Min:97.1 F (36.2 C), Max:102.1 F (38.9 C)   Recent Labs Lab 01/13/16 0841 01/14/16 1616  WBC 5.9 8.3  CREATININE 3.64* 5.04*  LATICACIDVEN  --  1.7    Estimated Creatinine Clearance: 10 mL/min (by C-G formula based on SCr of 5.04 mg/dL (H)).    Allergies  Allergen Reactions  . No Known Allergies    Antimicrobials this admission: Piperacillin/tazobactam 11/8 >>  vancomycin 11/8 >>  Levofloxacin dose in ED 11/8  Dose adjustments this admission:  Microbiology results: 11/8 BCx: Sent 11/8 UCx: Sent  11/8 Sputum: Sent   Thank you for allowing pharmacy to be a part of this patient's care.  Lenis Noon, PharmD, BCPS Clinical Pharmacist 01/14/2016 7:54 PM

## 2016-01-14 NOTE — ED Notes (Signed)
US at bedside

## 2016-01-14 NOTE — ED Notes (Signed)
ED Provider at bedside. 

## 2016-01-14 NOTE — H&P (Signed)
[facility] [place] NAME: Jamie Keller    MR#:  [id number]  DATE OF BIRTH:  [birth year]  DATE OF ADMISSION:  01/14/2016  PRIMARY CARE PHYSICIAN: [name], MD   REQUESTING/REFERRING PHYSICIAN: Dr Carrie Mew  CHIEF COMPLAINT:   Chief Complaint  Patient presents with  . Respiratory Distress    HISTORY OF PRESENT ILLNESS:  Jamie Keller  is a 50 y.o. female presents to the hospital with trouble breathing and stomach pain. At dialysis today she got anxious and started having trouble breathing and they sent her over to the hospital. He states that her stomach gets hard. Yesterday she had nausea and vomiting. She states that she vomits yellow phlegm. She states that she's been coughing since June and nonproductive phlegm. In the ER she was on 5 L of oxygen initially had a temperature of 100.2 and was tachycardic and hospitalist services were contacted for further evaluation.  PAST MEDICAL HISTORY:   Past Medical History:  Diagnosis Date  . Diabetes mellitus without complication ([place])   . Hypertension   . Multiple myeloma ([place])   . Osteoarthritis   . Pneumonia   . Post-menopausal 2014  . Sepsis ([place])     PAST SURGICAL HISTORY:   Past Surgical History:  Procedure Laterality Date  . CESAREAN SECTION     x2  . dialysis catheter placement    . PERIPHERAL VASCULAR CATHETERIZATION N/A 11/19/2015   Procedure: Dialysis/Perma Catheter Insertion;  Surgeon: Algernon Huxley, MD;  Location: [place] [facility];  Service: Cardiovascular;  Laterality: N/A;  . PORTACATH PLACEMENT      SOCIAL HISTORY:   Social History  Substance Use Topics  . Smoking status: Never Smoker  . Smokeless tobacco: Never Used  . Alcohol use No    FAMILY HISTORY:   Family History  Problem Relation Age of Onset  . Hypercholesterolemia Mother   . Hypertension Mother   . Hypertension Father     DRUG ALLERGIES:   Allergies   Allergen Reactions  . No Known Allergies     REVIEW OF SYSTEMS:  CONSTITUTIONAL: Positive for fever, chills and sweats. Positive for fatigue. Positive for weight loss.  EYES: Blurred vision when reading EARS, NOSE, AND THROAT: No tinnitus or ear pain. No sore throat. Positive for runny nose RESPIRATORY: Positive for cough and shortness of breath. No wheezing or hemoptysis.  CARDIOVASCULAR: No chest pain, orthopnea, edema.  GASTROINTESTINAL: Positive for nausea and vomiting. Positive for abdominal pain. No blood in bowel movements. No diarrhea GENITOURINARY: No dysuria, hematuria.  ENDOCRINE: No polyuria, nocturia,  HEMATOLOGY: History of anemia. SKIN: No rash or lesion. MUSCULOSKELETAL: No joint pain or arthritis.   NEUROLOGIC: No tingling, numbness, weakness.  PSYCHIATRY: No anxiety or depression.   MEDICATIONS AT HOME:   Prior to Admission medications   Medication Sig Start Date End Date Taking? Authorizing Provider  benzonatate (TESSALON) 200 MG capsule Take 1 capsule (200 mg total) by mouth 3 (three) times daily as needed for cough. 12/30/15   Lloyd Huger, MD  ferrous sulfate 325 (65 FE) MG tablet Take 325 mg by mouth daily with breakfast.    Historical Provider, MD  furosemide (LASIX) 80 MG tablet Take 1 tablet (80 mg total) by mouth daily. 11/29/15   Srikar Sudini, MD  gabapentin (NEURONTIN) 100 MG capsule Take 100 mg by mouth 3 (three) times daily.    Historical Provider, MD  Oxycodone HCl 10 MG TABS Take 1  tablet (10 mg total) by mouth every 6 (six) hours as needed. Patient taking differently: Take 10 mg by mouth every 6 (six) hours as needed (pain).  10/03/15   Lloyd Huger, MD  promethazine (PHENERGAN) 25 MG tablet Take 25 mg by mouth every 6 (six) hours as needed for nausea or vomiting.    Historical Provider, MD  verapamil (CALAN) 40 MG tablet Take 1 tablet (40 mg total) by mouth 2 (two) times daily. 11/28/15   Srikar Sudini, MD      VITAL SIGNS:  Blood  pressure 135/64, pulse (!) 120, temperature 98.6 F (37 C), temperature source Oral, resp. rate (!) 28, weight 49 kg (108 lb), last menstrual period 11/20/1985, SpO2 97 %.  PHYSICAL EXAMINATION:  GENERAL:  50 y.o.-year-old patient lying in the bed with no acute distress.  EYES: Pupils equal, round, reactive to light and accommodation. No scleral icterus. Extraocular muscles intact.  HEENT: Head atraumatic, normocephalic. Oropharynx and nasopharynx clear.  NECK:  Supple, no jugular venous distention. No thyroid enlargement, no tenderness.  LUNGS: Decreased breath sounds bilateral bases, no wheezing. Positive rhonchi bilateral bases. No use of accessory muscles of respiration.  CARDIOVASCULAR: S1, S2 tachycardic. No murmurs, rubs, or gallops.  ABDOMEN: Soft, nontender, nondistended. Bowel sounds present. No organomegaly or mass.  EXTREMITIES: Trace edema. No cyanosis, or clubbing.  NEUROLOGIC: Cranial nerves II through XII are intact. Muscle strength 5/5 in all extremities. Sensation intact. Gait not checked.  PSYCHIATRIC: The patient is alert and oriented x 3.  SKIN: No rash, lesion, or ulcer.   LABORATORY PANEL:   CBC  Recent Labs Lab 01/14/16 1616  WBC 8.3  HGB 8.1*  HCT 24.5*  PLT 104*   ------------------------------------------------------------------------------------------------------------------  Chemistries   Recent Labs Lab 01/14/16 1616  NA 135  K 3.9  CL 98*  CO2 23  GLUCOSE 153*  BUN 39*  CREATININE 5.04*  CALCIUM 8.9  AST 31  ALT 14  ALKPHOS 123  BILITOT 1.0   ------------------------------------------------------------------------------------------------------------------  Cardiac Enzymes  Recent Labs Lab 01/14/16 1616  TROPONINI 0.09*   ------------------------------------------------------------------------------------------------------------------  RADIOLOGY:  Dg Chest Port 1 View  Result Date: 01/14/2016 CLINICAL DATA:  Respiratory  distress EXAM: PORTABLE CHEST 1 VIEW COMPARISON:  12/04/2015 FINDINGS: Portable AP erect view of the chest. Right-sided central venous port tip overlies the cavoatrial junction. Left-sided central venous catheter tips overlie the right atrium with distal tip overlying the IVC right atrial junction. There are low lung volumes. There are small bilateral effusions. Bibasilar atelectasis or infiltrates. Mild cardiomegaly with central congestion and mild interstitial edema. No pneumothorax. IMPRESSION: 1. Low lung volumes with small bilateral effusion and bibasilar atelectasis or infiltrates 2. Mild cardiomegaly with central vascular congestion and diffuse interstitial edema. Electronically Signed   By: Donavan Foil M.D.   On: 01/14/2016 17:31   US Abdomen Limited Ruq  Result Date: 01/14/2016 CLINICAL DATA:  Sepsis, right upper quadrant pain for the past week EXAM: US ABDOMEN LIMITED - RIGHT UPPER QUADRANT COMPARISON:  CT scan of the abdomen and pelvis of January 01, 2016 FINDINGS: Gallbladder: The gallbladder is adequately distended. There is wall thickening to 5 mm. There is a small amount of pericholecystic fluid. There is no positive sonographic Murphy's sign. No stones or sludge are observed. Common bile duct: Diameter: 2 mm Liver: The hepatic echotexture is mildly increased. There is no focal mass nor ductal dilation. The surface contour of the liver is smooth. IMPRESSION: Mild gallbladder wall thickening with pericholecystic fluid with no  evidence of stones. The findings may reflect acalculous cholecystitis. A nuclear medicine hepatobiliary scan with gallbladder ejection fraction determination may be useful. Increased hepatic echotexture likely reflects fatty infiltrative change. No focal hepatic mass. Incidental note is made of a right pleural effusion. Electronically Signed   By: David  Martinique M.D.   On: 01/14/2016 17:24    EKG:   Sinus tachycardia 143 bpm. Left atrial enlargement.  IMPRESSION AND  PLAN:   1. Clinical sepsis, likely bibasilar pneumonia, tachycardia and fever. Follow-up blood cultures 2. Patient does have a port in dialysis catheter if the patient's blood cultures come back positive this may complicate things. Patient will be put on triple antibiotics with vancomycin and Zosyn and Zithromax. 2. Acute respiratory failure on presentation with respiratory distress and tachypnea. Patient was on 5 L initially. I dialed her down to 2 L and then she actually took off her oxygen and held her saturations on her own. 3. Right upper quadrant pain and epigastric pain. ER physician spoke with the surgeon to evaluate whether the gallbladder could be the cause of her discomfort. Ultrasound did not show much. I will let the surgeon decide on whether a CAT scan or HIDA scan would be worthwhile doing. Antibiotics would cover the gallbladder anyway. 4. End-stage renal disease on dialysis. Nephrology consultation. Did not have a full dialysis today. 5. Multiple myeloma. Patient on chronic pain medication 6. Type 2 diabetes mellitus. Patient states that she takes Lantus 5 units of insulin at home. I will continue this and add sliding scale. 7. Elevated troponin likely demand ischemia from clinical sepsis. 8. Anemia of chronic disease. Check a ferritin. Nephrology to consider Procrit with dialysis   All the records are reviewed and case discussed with ED provider. Management plans discussed with the patient, family and they are in agreement.  CODE STATUS: Full code  TOTAL TIME TAKING CARE OF THIS PATIENT: 55 minutes. Translator present at the bedside during the whole history and physical because the patient is a Spanish-speaking.   Loletha Grayer M.D on 01/14/2016 at 8:06 PM  Between 7am to 6pm - Pager - [phone]  After 6pm call admission pager [phone]  [employer] [facility]  [phone]  CC: Primary care physician; [name], MD

## 2016-01-14 NOTE — Consult Note (Signed)
Date of Consultation:  01/14/2016  Requesting Physician:  Carrie Mew, MD  Reason for Consultation:  Abdominal pain  History of Present Illness: Jamie Keller is a 50 y.o. female with history of multiple myeloma and end-stage renal disease who presents today from her dialysis session with shortness of breath. She reports coughing since last week and occasional feelings of shortness of breath and anxiety at home.  She also reports epigastric and right upper quadrant pain and has had nausea/vomiting yesterday.  Pain does not radiate.  Currently not having nausea but had feeling of fullness after eating a few bites of dinner.  Reports feeling febrile but no chills.  Denies chest pain, other areas of abdominal pain, diarrhea/constipation.  Has been having flatus and regular bowel movements.  Of note, the patient has had prior hospitalizations and CT scans of abdomen and pelvis due to abdominal pain which have not revealed any gallbladder pathology or small bowel obstruction.  She has had some pancreatitis on prior CT scan and stranding along Gerota's fascia.  Ultrasound done today shows mild gallbladder wall thickening and pericholecystic fluid, but her LFTs are normal and lipase is normal as well.   Past Medical History: Past Medical History:  Diagnosis Date  . Diabetes mellitus without complication ([place])   . Hypertension   . Multiple myeloma ([place])   . Osteoarthritis   . Pneumonia   . Post-menopausal 2014  . Sepsis [facility])      Past Surgical History: Past Surgical History:  Procedure Laterality Date  . CESAREAN SECTION     x2  . dialysis catheter placement    . PERIPHERAL VASCULAR CATHETERIZATION N/A 11/19/2015   Procedure: Dialysis/Perma Catheter Insertion;  Surgeon: Algernon Huxley, MD;  Location: [place] [facility];  Service: Cardiovascular;  Laterality: N/A;  . PORTACATH PLACEMENT      Home Medications: Prior to Admission medications   Medication Sig Start Date End Date  Taking? Authorizing Provider  benzonatate (TESSALON) 200 MG capsule Take 1 capsule (200 mg total) by mouth 3 (three) times daily as needed for cough. 12/30/15  Yes Lloyd Huger, MD  Insulin Detemir (LEVEMIR FLEXPEN) 100 UNIT/ML Pen Inject 5 Units into the skin daily at 10 pm.   Yes Historical Provider, MD  Oxycodone HCl 10 MG TABS Take 1 tablet (10 mg total) by mouth every 6 (six) hours as needed. Patient taking differently: Take 10 mg by mouth every 6 (six) hours as needed (pain).  10/03/15  Yes Lloyd Huger, MD  promethazine (PHENERGAN) 25 MG tablet Take 25 mg by mouth every 6 (six) hours as needed for nausea or vomiting.   Yes Historical Provider, MD  ferrous sulfate 325 (65 FE) MG tablet Take 325 mg by mouth daily with breakfast.    Historical Provider, MD  furosemide (LASIX) 80 MG tablet Take 1 tablet (80 mg total) by mouth daily. 11/29/15   Srikar Sudini, MD  gabapentin (NEURONTIN) 100 MG capsule Take 100 mg by mouth 3 (three) times daily.    Historical Provider, MD  verapamil (CALAN) 40 MG tablet Take 1 tablet (40 mg total) by mouth 2 (two) times daily. 11/28/15   Hillary Bow, MD    Allergies: Allergies  Allergen Reactions  . No Known Allergies     Social History:  reports that she has never smoked. She has never used smokeless tobacco. She reports that she does not drink alcohol or use drugs.   Family History: Family History  Problem Relation Age of  Onset  . Hypercholesterolemia Mother   . Hypertension Mother   . Hypertension Father     Review of Systems: Review of Systems  Constitutional: Positive for fever. Negative for chills.  HENT: Negative for hearing loss.   Eyes: Negative for blurred vision.  Respiratory: Positive for cough, sputum production and shortness of breath.   Cardiovascular: Negative for chest pain.  Gastrointestinal: Positive for abdominal pain, nausea and vomiting. Negative for blood in stool, constipation, diarrhea and heartburn.   Genitourinary: Negative for dysuria and hematuria.  Musculoskeletal: Negative for myalgias.  Skin: Negative for rash.  Neurological: Negative for dizziness.  Psychiatric/Behavioral: Negative for depression.  All other systems reviewed and are negative.   Physical Exam BP (!) 159/70 (BP Location: Right Arm)   Pulse (!) 104   Temp 99 F (37.2 C) (Oral)   Resp 18   Wt 49 kg (108 lb)   LMP 11/20/1985 (Approximate) Comment: Tubal ligation 1987  SpO2 98%   BMI 20.41 kg/m  CONSTITUTIONAL: No acute distress HEENT:  Normocephalic, atraumatic, extraocular motion intact. NECK: Trachea is midline, and there is no jugular venous distension. RESPIRATORY:  No respiratory distress.  Decreased breath sounds at bases, bilateral crackles. CARDIOVASCULAR: tachycardia, regular rhythm. GI: The abdomen is soft, mildly distended, nontympanitic. No fluid wave.  Tender to palpation over epigastric and medial right upper quadrant.  Negative Murphy's.  MUSCULOSKELETAL:  Normal muscle strength and tone in all four extremities.  No peripheral edema or cyanosis. SKIN: Skin turgor is normal. There are no pathologic skin lesions.  NEUROLOGIC:  Motor and sensation is grossly normal.  Cranial nerves are grossly intact. PSYCH:  Alert and oriented to person, place and time. Affect is normal.  Laboratory Analysis: Results for orders placed or performed during the hospital encounter of 01/14/16 (from the past 24 hour(s))  Lactic acid, plasma     Status: None   Collection Time: 01/14/16  4:16 PM  Result Value Ref Range   Lactic Acid, Venous 1.7 0.5 - 1.9 mmol/L  Comprehensive metabolic panel     Status: Abnormal   Collection Time: 01/14/16  4:16 PM  Result Value Ref Range   Sodium 135 135 - 145 mmol/L   Potassium 3.9 3.5 - 5.1 mmol/L   Chloride 98 (L) 101 - 111 mmol/L   CO2 23 22 - 32 mmol/L   Glucose, Bld 153 (H) 65 - 99 mg/dL   BUN 39 (H) 6 - 20 mg/dL   Creatinine, Ser 5.04 (H) 0.44 - 1.00 mg/dL   Calcium  8.9 8.9 - 10.3 mg/dL   Total Protein 6.7 6.5 - 8.1 g/dL   Albumin 3.0 (L) 3.5 - 5.0 g/dL   AST 31 15 - 41 U/L   ALT 14 14 - 54 U/L   Alkaline Phosphatase 123 38 - 126 U/L   Total Bilirubin 1.0 0.3 - 1.2 mg/dL   GFR calc non Af Amer 9 (L) >60 mL/min   GFR calc Af Amer 11 (L) >60 mL/min   Anion gap 14 5 - 15  Lipase, blood     Status: None   Collection Time: 01/14/16  4:16 PM  Result Value Ref Range   Lipase 26 11 - 51 U/L  Troponin I     Status: Abnormal   Collection Time: 01/14/16  4:16 PM  Result Value Ref Range   Troponin I 0.09 (HH) <0.03 ng/mL  CBC WITH DIFFERENTIAL     Status: Abnormal   Collection Time: 01/14/16  4:16 PM  Result  Value Ref Range   WBC 8.3 3.6 - 11.0 K/uL   RBC 2.64 (L) 3.80 - 5.20 MIL/uL   Hemoglobin 8.1 (L) 12.0 - 16.0 g/dL   HCT 24.5 (L) 35.0 - 47.0 %   MCV 92.9 80.0 - 100.0 fL   MCH 30.9 26.0 - 34.0 pg   MCHC 33.2 32.0 - 36.0 g/dL   RDW 19.9 (H) 11.5 - 14.5 %   Platelets 104 (L) 150 - 440 K/uL   Neutrophils Relative % 78 %   Neutro Abs 6.5 1.4 - 6.5 K/uL   Lymphocytes Relative 8 %   Lymphs Abs 0.7 (L) 1.0 - 3.6 K/uL   Monocytes Relative 13 %   Monocytes Absolute 1.1 (H) 0.2 - 0.9 K/uL   Eosinophils Relative 0 %   Eosinophils Absolute 0.0 0 - 0.7 K/uL   Basophils Relative 1 %   Basophils Absolute 0.1 0 - 0.1 K/uL  APTT     Status: Abnormal   Collection Time: 01/14/16  4:16 PM  Result Value Ref Range   aPTT 128 (H) 24 - 36 seconds  Protime-INR     Status: Abnormal   Collection Time: 01/14/16  4:16 PM  Result Value Ref Range   Prothrombin Time 18.4 (H) 11.4 - 15.2 seconds   INR 1.51   Urinalysis complete, with microscopic ([facility] only)     Status: Abnormal   Collection Time: 01/14/16  4:16 PM  Result Value Ref Range   Color, Urine STRAW (A) YELLOW   APPearance CLEAR (A) CLEAR   Glucose, UA 50 (A) NEGATIVE mg/dL   Bilirubin Urine NEGATIVE NEGATIVE   Ketones, ur NEGATIVE NEGATIVE mg/dL   Specific Gravity, Urine 1.006 1.005 - 1.030   Hgb  urine dipstick NEGATIVE NEGATIVE   pH 8.0 5.0 - 8.0   Protein, ur 100 (A) NEGATIVE mg/dL   Nitrite NEGATIVE NEGATIVE   Leukocytes, UA NEGATIVE NEGATIVE   RBC / HPF 0-5 0 - 5 RBC/hpf   WBC, UA 0-5 0 - 5 WBC/hpf   Bacteria, UA NONE SEEN NONE SEEN   Squamous Epithelial / LPF 0-5 (A) NONE SEEN  Influenza panel by PCR (type A & B, H1N1)     Status: None   Collection Time: 01/14/16  4:16 PM  Result Value Ref Range   Influenza A By PCR NEGATIVE NEGATIVE   Influenza B By PCR NEGATIVE NEGATIVE  Pregnancy, urine POC     Status: None   Collection Time: 01/14/16  7:14 PM  Result Value Ref Range   Preg Test, Ur NEGATIVE NEGATIVE  Culture, sputum-assessment     Status: None   Collection Time: 01/14/16  7:19 PM  Result Value Ref Range   Specimen Description SPUTUM    Special Requests NONE    Sputum evaluation      Sputum specimen not acceptable for testing.  Please recollect.   CALLED TO NELLIE MONAR AT 2035 01/14/2016 BY TFK.    Report Status 01/14/2016 FINAL   Glucose, capillary     Status: Abnormal   Collection Time: 01/14/16  9:51 PM  Result Value Ref Range   Glucose-Capillary 133 (H) 65 - 99 mg/dL    Imaging: Dg Chest Port 1 View  Result Date: 01/14/2016 CLINICAL DATA:  Respiratory distress EXAM: PORTABLE CHEST 1 VIEW COMPARISON:  12/04/2015 FINDINGS: Portable AP erect view of the chest. Right-sided central venous port tip overlies the cavoatrial junction. Left-sided central venous catheter tips overlie the right atrium with distal tip overlying the IVC right  atrial junction. There are low lung volumes. There are small bilateral effusions. Bibasilar atelectasis or infiltrates. Mild cardiomegaly with central congestion and mild interstitial edema. No pneumothorax. IMPRESSION: 1. Low lung volumes with small bilateral effusion and bibasilar atelectasis or infiltrates 2. Mild cardiomegaly with central vascular congestion and diffuse interstitial edema. Electronically Signed   By: Donavan Foil  M.D.   On: 01/14/2016 17:31   US Abdomen Limited Ruq  Result Date: 01/14/2016 CLINICAL DATA:  Sepsis, right upper quadrant pain for the past week EXAM: US ABDOMEN LIMITED - RIGHT UPPER QUADRANT COMPARISON:  CT scan of the abdomen and pelvis of January 01, 2016 FINDINGS: Gallbladder: The gallbladder is adequately distended. There is wall thickening to 5 mm. There is a small amount of pericholecystic fluid. There is no positive sonographic Murphy's sign. No stones or sludge are observed. Common bile duct: Diameter: 2 mm Liver: The hepatic echotexture is mildly increased. There is no focal mass nor ductal dilation. The surface contour of the liver is smooth. IMPRESSION: Mild gallbladder wall thickening with pericholecystic fluid with no evidence of stones. The findings may reflect acalculous cholecystitis. A nuclear medicine hepatobiliary scan with gallbladder ejection fraction determination may be useful. Increased hepatic echotexture likely reflects fatty infiltrative change. No focal hepatic mass. Incidental note is made of a right pleural effusion. Electronically Signed   By: David  Martinique M.D.   On: 01/14/2016 17:24    Assessment and Plan: This is a 50 y.o. female who presents with shortness of breath, tachycardia, tachypnea, and abdominal pain with nausea/vomiting yesterday.  I have reviewed the patient's laboratory and imaging studies and discussed them with the patient.  Overall, LFTs are normal and has normal WBC.  Lipase is normal as well.  Ultrasound does show mild gallbladder wall thickening and pericholecystic fluid but no stones.  CXR shows bilateral effusions and interstitial edema.  On prior CT scans (last one 2 weeks ago), no gallbladder pathology has been noted but the patient has had pancreatitis and some stranding along Gerota's fascia.    Recommend obtaining HIDA scan to further evaluate gallbladder pathology given the thickening and fluid as well as location of pain and symptoms.   Pancreatitis as a source of inflammation would be less likely given normal lipase today.  Will follow along with you for any potential surgical needs.   Melvyn Neth, [place]

## 2016-01-14 NOTE — ED Notes (Signed)
Pt bib EMS from dialysis w/ c/o resp distress.  Pt unable to complete dialysis.  Pt alert and oriented, denies pain.  Resp labored and rapid.  Spanish speaking.  Per EMS, pt had 102.4 temp at dialysis.  Pt pale, diaphoretic.

## 2016-01-15 ENCOUNTER — Other Ambulatory Visit: Payer: Self-pay | Admitting: *Deleted

## 2016-01-15 DIAGNOSIS — R1013 Epigastric pain: Secondary | ICD-10-CM

## 2016-01-15 LAB — GLUCOSE, CAPILLARY
GLUCOSE-CAPILLARY: 90 mg/dL (ref 65–99)
Glucose-Capillary: 189 mg/dL — ABNORMAL HIGH (ref 65–99)

## 2016-01-15 LAB — BASIC METABOLIC PANEL
ANION GAP: 11 (ref 5–15)
BUN: 46 mg/dL — ABNORMAL HIGH (ref 6–20)
CALCIUM: 8.2 mg/dL — AB (ref 8.9–10.3)
CO2: 23 mmol/L (ref 22–32)
Chloride: 101 mmol/L (ref 101–111)
Creatinine, Ser: 5.23 mg/dL — ABNORMAL HIGH (ref 0.44–1.00)
GFR, EST AFRICAN AMERICAN: 10 mL/min — AB (ref >60)
GFR, EST NON AFRICAN AMERICAN: 9 mL/min — AB (ref >60)
Glucose, Bld: 108 mg/dL — ABNORMAL HIGH (ref 65–99)
POTASSIUM: 4 mmol/L (ref 3.5–5.1)
SODIUM: 135 mmol/L (ref 135–145)

## 2016-01-15 LAB — KAPPA/LAMBDA LIGHT CHAINS
KAPPA FREE LGHT CHN: 18408.3 mg/L — AB (ref 3.3–19.4)
Kappa, lambda light chain ratio: 684.32 — ABNORMAL HIGH (ref 0.26–1.65)
LAMDA FREE LIGHT CHAINS: 26.9 mg/L — AB (ref 5.7–26.3)

## 2016-01-15 LAB — CBC
HEMATOCRIT: 21.3 % — AB (ref 35.0–47.0)
HEMOGLOBIN: 7.1 g/dL — AB (ref 12.0–16.0)
MCH: 30.5 pg (ref 26.0–34.0)
MCHC: 33.4 g/dL (ref 32.0–36.0)
MCV: 91.3 fL (ref 80.0–100.0)
Platelets: 69 10*3/uL — ABNORMAL LOW (ref 150–440)
RBC: 2.33 MIL/uL — ABNORMAL LOW (ref 3.80–5.20)
RDW: 20.3 % — ABNORMAL HIGH (ref 11.5–14.5)
WBC: 3.5 10*3/uL — AB (ref 3.6–11.0)

## 2016-01-15 LAB — PHOSPHORUS: PHOSPHORUS: 5.3 mg/dL — AB (ref 2.5–4.6)

## 2016-01-15 LAB — MRSA PCR SCREENING: MRSA by PCR: NEGATIVE

## 2016-01-15 MED ORDER — FAMOTIDINE IN NACL 20-0.9 MG/50ML-% IV SOLN: 20.0000 mg | INTRAVENOUS | Status: DC

## 2016-01-15 MED ORDER — ENSURE ENLIVE PO LIQD
237.0000 mL | Freq: Two times a day (BID) | ORAL | Status: DC
  Administered 2016-01-15: 237 mL via ORAL

## 2016-01-15 MED ORDER — AZITHROMYCIN 250 MG PO TABS: ORAL_TABLET | ORAL | 0 refills | Status: DC

## 2016-01-15 MED ORDER — HEPARIN SOD (PORK) LOCK FLUSH 100 UNIT/ML IV SOLN
500.0000 [IU] | Freq: Once | INTRAVENOUS | Status: AC
  Administered 2016-01-15: 500 [IU] via INTRAVENOUS
  Filled 2016-01-15: qty 5

## 2016-01-15 MED ORDER — ENSURE ENLIVE PO LIQD: 237.0000 mL | Freq: Two times a day (BID) | ORAL | 12 refills | Status: DC

## 2016-01-15 MED ORDER — CEFUROXIME AXETIL 500 MG PO TABS
500.0000 mg | ORAL_TABLET | Freq: Two times a day (BID) | ORAL | Status: DC
  Administered 2016-01-15 (×2): 500 mg via ORAL
  Filled 2016-01-15: qty 2
  Filled 2016-01-15: qty 1

## 2016-01-15 MED ORDER — CEFUROXIME AXETIL 500 MG PO TABS: 500.0000 mg | ORAL_TABLET | Freq: Two times a day (BID) | ORAL | 0 refills | Status: DC

## 2016-01-15 NOTE — Progress Notes (Signed)
Initial Nutrition Assessment  DOCUMENTATION CODES:   Not applicable  INTERVENTION:  1. Ensure Enlive po BID, each supplement provides 350 kcal and 20 grams of protein   NUTRITION DIAGNOSIS:   Increased nutrient needs related to chronic illness as evidenced by estimated needs.  GOAL:   Patient will meet greater than or equal to 90% of their needs  MONITOR:   PO intake, I & O's, Labs, Weight trends, Supplement acceptance  REASON FOR ASSESSMENT:   Malnutrition Screening Tool    ASSESSMENT:   Jamie Keller  is a 50 y.o. female presents to the hospital with trouble breathing and stomach pain. At dialysis today she got anxious and started having trouble breathing and they sent her over to the hospital  Patient in dialysis during visit. Unable to complete Nutrition-Focused physical exam at this time.  Appears pt's is relatively stable. Meal Completion this morning: 100% Appears she is not on phosphate binders at this time. Labs and medications reviewed: Phos 5.3 Iron  Diet Order:  Diet renal/carb modified with fluid restriction Diet-HS Snack? Nothing; Room service appropriate? Yes; Fluid consistency: Thin  Skin:  Reviewed, no issues  Last BM:  01/14/2016  Height:   Ht Readings from Last 1 Encounters:  01/15/16 5\' 2"  (1.575 m)    Weight:   Wt Readings from Last 1 Encounters:  01/15/16 114 lb 13.8 oz (52.1 kg)    Ideal Body Weight:  50 kg  BMI:  Body mass index is 21.01 kg/m.  Estimated Nutritional Needs:   Kcal:  1700-1960 calories  Protein:  63-84 gm  Fluid:  >/= 1.7L  EDUCATION NEEDS:   No education needs identified at this time  Jamie Keller. Jamie Rybarczyk, MS, RD LDN Inpatient Clinical Dietitian Pager [phone]

## 2016-01-15 NOTE — Progress Notes (Signed)
Pre Dialysis 

## 2016-01-15 NOTE — Discharge Summary (Signed)
Jamie Keller at [facility] NAME: Jamie Keller    MR#:  [id number]  DATE OF BIRTH:  Apr 02, 1965  DATE OF ADMISSION:  01/14/2016 ADMITTING PHYSICIAN: Loletha Grayer, MD  DATE OF DISCHARGE: 01/15/16  PRIMARY CARE PHYSICIAN: [name], MD    ADMISSION DIAGNOSIS:  Shortness of breath [R06.02] HCAP (healthcare-associated pneumonia) [J18.9] Sepsis, due to unspecified organism ([place]) [A41.9]  DISCHARGE DIAGNOSIS:  Right lower lobe pneumonia. Shortness of breath resolved Nausea vomiting and abdominal pain resolved End-stage renal disease on hemodialysis History of multiple myeloma undergoing chemotherapy at the care center SECONDARY DIAGNOSIS:   Past Medical History:  Diagnosis Date  . Diabetes mellitus without complication ([place])   . Hypertension   . Multiple myeloma ([place])   . Osteoarthritis   . Pneumonia   . Post-menopausal 2014  . Sepsis [facility])     HOSPITAL COURSE:  Jamie Keller  is a 50 y.o. female presents to the hospital with trouble breathing and stomach pain. At dialysis today she got anxious and started having trouble breathing and they sent her over to the hospital. He states that her stomach gets hard. Yesterday she had nausea and vomiting. She states that she vomits yellow phlegm. She states that she's been coughing since June and nonproductive phlegm  1. Clinical sepsis, likely bibasilar pneumonia, tachycardia and fever -.Follow-up blood cultures negative -.continue Ceftin and Zithromax for 7 days -Patient afebrile sats 98% on room air  2. Acute respiratory failure on presentation with respiratory distress and tachypnea.  -No respiratory distress. Sats are stable on room air  3. Right upper quadrant pain and epigastric pain.  -Patient had episode of vomiting which could've caused her abdominal pain. It is resolved. She is able to tolerate diet. Appreciate surgical input. We'll hold off on further  investigation since patient's symptoms have improved  4. End-stage renal disease on dialysis. Patient will  get dialysis today  5. Multiple myeloma. Patient on chronic pain medication  6. Type 2 diabetes mellitus. Patient states that she takes Lantus 5 units of insulin at home. I will continue this and add sliding scale.  7. Elevated troponin likely demand ischemia from clinical sepsis.  8. Anemia of chronic disease. Check a ferritin. Nephrology to consider Procrit with dialysis  She'll discharged to home after dialysisShe continues to improve CONSULTS OBTAINED:  Treatment Team:  Olean Ree, MD Hubbard Robinson, MD Munsoor Holley Raring, MD  DRUG ALLERGIES:   Allergies  Allergen Reactions  . No Known Allergies     DISCHARGE MEDICATIONS:   Current Discharge Medication List    START taking these medications   Details  azithromycin (ZITHROMAX) 250 MG tablet Take as directed Qty: 4 each, Refills: 0    cefUROXime (CEFTIN) 500 MG tablet Take 1 tablet (500 mg total) by mouth 2 (two) times daily with a meal. Qty: 14 tablet, Refills: 0    feeding supplement, ENSURE ENLIVE, (ENSURE ENLIVE) LIQD Take 237 mLs by mouth 2 (two) times daily between meals. Qty: 237 mL, Refills: 12      CONTINUE these medications which have NOT CHANGED   Details  benzonatate (TESSALON) 200 MG capsule Take 1 capsule (200 mg total) by mouth 3 (three) times daily as needed for cough. Qty: 30 capsule, Refills: 2    Insulin Detemir (LEVEMIR FLEXPEN) 100 UNIT/ML Pen Inject 5 Units into the skin daily at 10 pm.    Oxycodone HCl 10 MG TABS Take 1 tablet (10 mg total) by  mouth every 6 (six) hours as needed. Qty: 60 tablet, Refills: 0    promethazine (PHENERGAN) 25 MG tablet Take 25 mg by mouth every 6 (six) hours as needed for nausea or vomiting.    ferrous sulfate 325 (65 FE) MG tablet Take 325 mg by mouth daily with breakfast.    furosemide (LASIX) 80 MG tablet Take 1 tablet (80 mg total) by mouth  daily. Qty: 30 tablet, Refills: 0    gabapentin (NEURONTIN) 100 MG capsule Take 100 mg by mouth 3 (three) times daily.    verapamil (CALAN) 40 MG tablet Take 1 tablet (40 mg total) by mouth 2 (two) times daily. Qty: 60 tablet, Refills: 0        If you experience worsening of your admission symptoms, develop shortness of breath, life threatening emergency, suicidal or homicidal thoughts you must seek medical attention immediately by calling 911 or calling your MD immediately  if symptoms less severe.  You Must read complete instructions/literature along with all the possible adverse reactions/side effects for all the Medicines you take and that have been prescribed to you. Take any new Medicines after you have completely understood and accept all the possible adverse reactions/side effects.   Please note  You were cared for by a hospitalist during your hospital stay. If you have any questions about your discharge medications or the care you received while you were in the hospital after you are discharged, you can call the unit and asked to speak with the hospitalist on call if the hospitalist that took care of you is not available. Once you are discharged, your primary care physician will handle any further medical issues. Please note that NO REFILLS for any discharge medications will be authorized once you are discharged, as it is imperative that you return to your primary care physician (or establish a relationship with a primary care physician if you do not have one) for your aftercare needs so that they can reassess your need for medications and monitor your lab values. Today   SUBJECTIVE   Via interpreter patient has a whole lot better. No lumbar vomiting. Abdominal pain mild. Feels better once to eat  VITAL SIGNS:  Blood pressure 140/64, pulse 94, temperature 98.2 F (36.8 C), temperature source Oral, resp. rate (!) 25, height 5' 2"  (1.575 m), weight 52.1 kg (114 lb 13.8 oz), last  menstrual period 11/20/1985, SpO2 97 %.  I/O:   Intake/Output Summary (Last 24 hours) at 01/15/16 1315 Last data filed at 01/15/16 0800  Gross per 24 hour  Intake              240 ml  Output                0 ml  Net              240 ml    PHYSICAL EXAMINATION:  GENERAL:  50 y.o.-year-old patient lying in the bed with no acute distress.  EYES: Pupils equal, round, reactive to light and accommodation. No scleral icterus. Extraocular muscles intact.  HEENT: Head atraumatic, normocephalic. Oropharynx and nasopharynx clear.  NECK:  Supple, no jugular venous distention. No thyroid enlargement, no tenderness.  LUNGS: Normal breath sounds bilaterally, no wheezing, rales,rhonchi or crepitation. No use of accessory muscles of respiration.  CARDIOVASCULAR: S1, S2 normal. No murmurs, rubs, or gallops.  ABDOMEN: Soft, non-tender, non-distended. Bowel sounds present. No organomegaly or mass.  EXTREMITIES: No pedal edema, cyanosis, or clubbing.  NEUROLOGIC: Cranial nerves II through  XII are intact. Muscle strength 5/5 in all extremities. Sensation intact. Gait not checked.  PSYCHIATRIC: The patient is alert and oriented x 3.  SKIN: No obvious rash, lesion, or ulcer.   DATA REVIEW:   CBC   Recent Labs Lab 01/15/16 0541  WBC 3.5*  HGB 7.1*  HCT 21.3*  PLT 69*    Chemistries   Recent Labs Lab 01/14/16 1616 01/15/16 0541  NA 135 135  K 3.9 4.0  CL 98* 101  CO2 23 23  GLUCOSE 153* 108*  BUN 39* 46*  CREATININE 5.04* 5.23*  CALCIUM 8.9 8.2*  AST 31  --   ALT 14  --   ALKPHOS 123  --   BILITOT 1.0  --     Microbiology Results   Recent Results (from the past 240 hour(s))  Blood Culture (routine x 2)     Status: None (Preliminary result)   Collection Time: 01/14/16  4:16 PM  Result Value Ref Range Status   Specimen Description BLOOD LEFT ASSIST CONTROL  Final   Special Requests   Final    BOTTLES DRAWN AEROBIC AND ANAEROBIC 10CCAERO,5CCANA   Culture NO GROWTH < 24 HOURS   Final   Report Status PENDING  Incomplete  Blood Culture (routine x 2)     Status: None (Preliminary result)   Collection Time: 01/14/16  4:16 PM  Result Value Ref Range Status   Specimen Description BLOOD RIGHT HAND  Final   Special Requests BOTTLES DRAWN AEROBIC AND ANAEROBIC 3CCAERO,2CCANA  Final   Culture NO GROWTH < 24 HOURS  Final   Report Status PENDING  Incomplete  Culture, sputum-assessment     Status: None   Collection Time: 01/14/16  7:19 PM  Result Value Ref Range Status   Specimen Description SPUTUM  Final   Special Requests NONE  Final   Sputum evaluation   Final    Sputum specimen not acceptable for testing.  Please recollect.   CALLED TO NELLIE MONAR AT 2035 01/14/2016 BY TFK.    Report Status 01/14/2016 FINAL  Final  MRSA PCR Screening     Status: None   Collection Time: 01/14/16 11:32 PM  Result Value Ref Range Status   MRSA by PCR NEGATIVE NEGATIVE Final    Comment:        The GeneXpert MRSA Assay (FDA approved for NASAL specimens only), is one component of a comprehensive MRSA colonization surveillance program. It is not intended to diagnose MRSA infection nor to guide or monitor treatment for MRSA infections.     RADIOLOGY:  Dg Chest Port 1 View  Result Date: 01/14/2016 CLINICAL DATA:  Respiratory distress EXAM: PORTABLE CHEST 1 VIEW COMPARISON:  12/04/2015 FINDINGS: Portable AP erect view of the chest. Right-sided central venous port tip overlies the cavoatrial junction. Left-sided central venous catheter tips overlie the right atrium with distal tip overlying the IVC right atrial junction. There are low lung volumes. There are small bilateral effusions. Bibasilar atelectasis or infiltrates. Mild cardiomegaly with central congestion and mild interstitial edema. No pneumothorax. IMPRESSION: 1. Low lung volumes with small bilateral effusion and bibasilar atelectasis or infiltrates 2. Mild cardiomegaly with central vascular congestion and diffuse interstitial  edema. Electronically Signed   By: Donavan Foil M.D.   On: 01/14/2016 17:31   US Abdomen Limited Ruq  Result Date: 01/14/2016 CLINICAL DATA:  Sepsis, right upper quadrant pain for the past week EXAM: US ABDOMEN LIMITED - RIGHT UPPER QUADRANT COMPARISON:  CT scan of the abdomen and  pelvis of January 01, 2016 FINDINGS: Gallbladder: The gallbladder is adequately distended. There is wall thickening to 5 mm. There is a small amount of pericholecystic fluid. There is no positive sonographic Murphy's sign. No stones or sludge are observed. Common bile duct: Diameter: 2 mm Liver: The hepatic echotexture is mildly increased. There is no focal mass nor ductal dilation. The surface contour of the liver is smooth. IMPRESSION: Mild gallbladder wall thickening with pericholecystic fluid with no evidence of stones. The findings may reflect acalculous cholecystitis. A nuclear medicine hepatobiliary scan with gallbladder ejection fraction determination may be useful. Increased hepatic echotexture likely reflects fatty infiltrative change. No focal hepatic mass. Incidental note is made of a right pleural effusion. Electronically Signed   By: David  Martinique M.D.   On: 01/14/2016 17:24     Management plans discussed with the patient, family and they are in agreement.  CODE STATUS:     Code Status Orders        Start     Ordered   01/14/16 1959  Full code  Continuous     01/14/16 1959    Code Status History    Date Active Date Inactive Code Status Order ID Comments User Context   12/02/2015  1:10 AM 12/08/2015  7:22 PM Full Code [id number]  Lance Coon, MD ED   11/14/2015 12:52 AM 11/17/2015  1:12 PM Full Code [id number]  Holley Raring, NP ED   11/14/2015 12:06 AM 11/14/2015 12:12 AM Full Code [id number]  Holley Raring, NP ED      TOTAL TIME TAKING CARE OF THIS PATIENT: 40 minutes.    Redell Nazir M.D on 01/15/2016 at 1:15 PM  Between 7am to 6pm - Pager - [phone] After 6pm go to [email or web address] - password  [facility]  Office  [phone]  CC: Primary care physician; [name], MD

## 2016-01-15 NOTE — Progress Notes (Signed)
Report given to RN on 2C. Madlyn Frankel, RN

## 2016-01-15 NOTE — Progress Notes (Signed)
Post dialysis 

## 2016-01-15 NOTE — Progress Notes (Signed)
Dialysis complete

## 2016-01-15 NOTE — Progress Notes (Signed)
Dialysis started 

## 2016-01-15 NOTE — Progress Notes (Signed)
[facility]  ROUNDING NOTE   Subjective:  Patient yesterday was having cough, shortness of breath, and fever during dialysis. Therefore we advised the patient to be sent over to [facility]. She was recently diagnosed with atypical pneumonia. She presented similarly now. Infiltrates were noted on chest x-ray.    Objective:  Vital signs in last 24 hours:  Temp:  [98 F (36.7 C)-102.1 F (38.9 C)] 98.2 F (36.8 C) (11/09 1000) Pulse Rate:  [99-140] 101 (11/09 1045) Resp:  [17-38] 29 (11/09 1045) BP: (105-184)/(50-90) 134/73 (11/09 1045) SpO2:  [89 %-100 %] 97 % (11/09 1045) Weight:  [49 kg (108 lb)-52.1 kg (114 lb 13.8 oz)] 52.1 kg (114 lb 13.8 oz) (11/09 1000)  Weight change:  Filed Weights   01/14/16 1615 01/15/16 1000  Weight: 49 kg (108 lb) 52.1 kg (114 lb 13.8 oz)    Intake/Output: No intake/output data recorded.   Intake/Output this shift:  Total I/O In: 240 [P.O.:240] Out: -   Physical Exam: General: No acute distress  Head: Normocephalic, atraumatic. Moist oral mucosal membranes  Eyes: Anicteric  Neck: Supple, trachea midline  Lungs:  Bilateral rales normal effort  Heart: S1S2 no rubs  Abdomen:  Soft, nontender, Bowel sounds present   Extremities: No peripheral edema.  Neurologic: Nonfocal, moving all four extremities  Skin: No lesions  Access: L IJ permcath    Basic Metabolic Panel:  Recent Labs Lab 01/13/16 0841 01/14/16 1616 01/15/16 0541 01/15/16 1010  NA 137 135 135  --   K 3.4* 3.9 4.0  --   CL 100* 98* 101  --   CO2 27 23 23   --   GLUCOSE 130* 153* 108*  --   BUN 29* 39* 46*  --   CREATININE 3.64* 5.04* 5.23*  --   CALCIUM 9.0 8.9 8.2*  --   PHOS  --   --   --  5.3*    Liver Function Tests:  Recent Labs Lab 01/13/16 0841 01/14/16 1616  AST 29 31  ALT 13* 14  ALKPHOS 102 123  BILITOT 1.3* 1.0  PROT 6.7 6.7  ALBUMIN 3.1* 3.0*    Recent Labs Lab 01/14/16 1616  LIPASE 26   No results  for input(s): AMMONIA in the last 168 hours.  CBC:  Recent Labs Lab 01/13/16 0841 01/14/16 1616 01/15/16 0541  WBC 5.9 8.3 3.5*  NEUTROABS 4.1 6.5  --   HGB 8.0* 8.1* 7.1*  HCT 23.5* 24.5* 21.3*  MCV 90.8 92.9 91.3  PLT 95* 104* 69*    Cardiac Enzymes:  Recent Labs Lab 01/14/16 1616  TROPONINI 0.09*    BNP: Invalid input(s): POCBNP  CBG:  Recent Labs Lab 01/14/16 2151 01/15/16 0737  GLUCAP 133* 90    Microbiology: Results for orders placed or performed during the hospital encounter of 01/14/16  Blood Culture (routine x 2)     Status: None (Preliminary result)   Collection Time: 01/14/16  4:16 PM  Result Value Ref Range Status   Specimen Description BLOOD LEFT ASSIST CONTROL  Final   Special Requests   Final    BOTTLES DRAWN AEROBIC AND ANAEROBIC 10CCAERO,5CCANA   Culture NO GROWTH < 24 HOURS  Final   Report Status PENDING  Incomplete  Blood Culture (routine x 2)     Status: None (Preliminary result)   Collection Time: 01/14/16  4:16 PM  Result Value Ref Range Status   Specimen Description BLOOD RIGHT HAND  Final   Special Requests  BOTTLES DRAWN AEROBIC AND ANAEROBIC 3CCAERO,2CCANA  Final   Culture NO GROWTH < 24 HOURS  Final   Report Status PENDING  Incomplete  Culture, sputum-assessment     Status: None   Collection Time: 01/14/16  7:19 PM  Result Value Ref Range Status   Specimen Description SPUTUM  Final   Special Requests NONE  Final   Sputum evaluation   Final    Sputum specimen not acceptable for testing.  Please recollect.   CALLED TO NELLIE MONAR AT 2035 01/14/2016 BY TFK.    Report Status 01/14/2016 FINAL  Final  MRSA PCR Screening     Status: None   Collection Time: 01/14/16 11:32 PM  Result Value Ref Range Status   MRSA by PCR NEGATIVE NEGATIVE Final    Comment:        The GeneXpert MRSA Assay (FDA approved for NASAL specimens only), is one component of a comprehensive MRSA colonization surveillance program. It is not intended to  diagnose MRSA infection nor to guide or monitor treatment for MRSA infections.     Coagulation Studies:  Recent Labs  01/14/16 1616  LABPROT 18.4*  INR 1.51    Urinalysis:  Recent Labs  01/14/16 1616  COLORURINE STRAW*  LABSPEC 1.006  PHURINE 8.0  GLUCOSEU 50*  HGBUR NEGATIVE  BILIRUBINUR NEGATIVE  KETONESUR NEGATIVE  PROTEINUR 100*  NITRITE NEGATIVE  LEUKOCYTESUR NEGATIVE      Imaging: Dg Chest Port 1 View  Result Date: 01/14/2016 CLINICAL DATA:  Respiratory distress EXAM: PORTABLE CHEST 1 VIEW COMPARISON:  12/04/2015 FINDINGS: Portable AP erect view of the chest. Right-sided central venous port tip overlies the cavoatrial junction. Left-sided central venous catheter tips overlie the right atrium with distal tip overlying the IVC right atrial junction. There are low lung volumes. There are small bilateral effusions. Bibasilar atelectasis or infiltrates. Mild cardiomegaly with central congestion and mild interstitial edema. No pneumothorax. IMPRESSION: 1. Low lung volumes with small bilateral effusion and bibasilar atelectasis or infiltrates 2. Mild cardiomegaly with central vascular congestion and diffuse interstitial edema. Electronically Signed   By: Donavan Foil M.D.   On: 01/14/2016 17:31   US Abdomen Limited Ruq  Result Date: 01/14/2016 CLINICAL DATA:  Sepsis, right upper quadrant pain for the past week EXAM: US ABDOMEN LIMITED - RIGHT UPPER QUADRANT COMPARISON:  CT scan of the abdomen and pelvis of January 01, 2016 FINDINGS: Gallbladder: The gallbladder is adequately distended. There is wall thickening to 5 mm. There is a small amount of pericholecystic fluid. There is no positive sonographic Murphy's sign. No stones or sludge are observed. Common bile duct: Diameter: 2 mm Liver: The hepatic echotexture is mildly increased. There is no focal mass nor ductal dilation. The surface contour of the liver is smooth. IMPRESSION: Mild gallbladder wall thickening with  pericholecystic fluid with no evidence of stones. The findings may reflect acalculous cholecystitis. A nuclear medicine hepatobiliary scan with gallbladder ejection fraction determination may be useful. Increased hepatic echotexture likely reflects fatty infiltrative change. No focal hepatic mass. Incidental note is made of a right pleural effusion. Electronically Signed   By: David  Martinique M.D.   On: 01/14/2016 17:24     Medications:    . [START ON 01/16/2016] azithromycin  250 mg Oral Daily  . cefUROXime  500 mg Oral BID WC  . [START ON 01/16/2016] famotidine (PEPCID) IV  20 mg Intravenous Q48H  . ferrous sulfate  325 mg Oral Q breakfast  . gabapentin  100 mg Oral TID  .  heparin  5,000 Units Subcutaneous Q8H  . insulin aspart  0-5 Units Subcutaneous QHS  . insulin aspart  0-9 Units Subcutaneous TID WC  . insulin glargine  5 Units Subcutaneous Daily  . verapamil  40 mg Oral BID   acetaminophen **OR** acetaminophen, benzonatate, oxyCODONE, promethazine  Assessment/ Plan:  50 y.o. female with diabetes mellitus type 2, hypertension, multiple myeloma on chemotherapy, osteoarthritis, ESRD on HD, anemia of CKD, SHPTH  [facility]/MWF3/[address]  1.  End-stage renal disease on hemodialysis MWF:  Patient only had 20 minutes of hemodialysis yesterday. Therefore we decided to proceed with dialysis today. Patient seen and evaluated during hemodialysis and appears to be tolerating well. We'll plan to complete hemodialysis today and she will resume her normal outpatient schedule tomorrow.  2. Anemia of chronic kidney disease. Hemoglobin down to 7.1. Patient will continue on Epogen as an outpatient however she may end up needing blood transfusion as an outpatient as well.  3. Secondary hyperparathyroidism. Check intact PTH and phosphorus with dialysis today.  4. Bacterial pneumonia. The patient has had 2 recent bouts of pneumonia. At the last hospitalization this was felt to be atypical in nature.  However she responded nicely to antibiotic therapy. Fungal cultures and AFB was negative.  5. Thanks for consultation.   LOS: 1 Heinrich Fertig 11/9/201710:53 AM

## 2016-01-15 NOTE — Care Management (Signed)
Admitted to [facility] with the diagnosis of sepsis. Lives with husband, Beatriz Chancellor, [phone]) and children.  Established dialysis patient since 11/2015 with [facility] on Monday-Wednesday-Friday. Sometimes uses [facility] for transportation, most of the time husband transports.  Dr. Alene Mires is listed as primary care physician. Goes to [facility] for prescription medications. History of multiple myeloma. [facility]es to [facility] of [facility] care. Poor appetite.  Shelbie Ammons RN MSN CCM Care Management [phone]

## 2016-01-15 NOTE — Plan of Care (Signed)
Problem: Education: Goal: Knowledge of [place] [facility] Education information/materials will improve Outcome: Progressing Pt likes to be called [place] Pt speaks Spanish only  Past Medical History:  Diagnosis Date  . Diabetes mellitus without complication ([address])   . Hypertension   . Multiple myeloma ([place])   . Osteoarthritis   . Pneumonia   . Post-menopausal 2014  . Sepsis ([place])    Pt is well controlled with home medications

## 2016-01-15 NOTE — Progress Notes (Signed)
Patient to potentially transfer to [facility] after dialysis treatment. Madlyn Frankel, RN

## 2016-01-15 NOTE — Progress Notes (Signed)
HD consent reviewed and signed with interpreter at bedside. Patient transported to dialysis. Madlyn Frankel, RN

## 2016-01-15 NOTE — Discharge Instructions (Signed)
Resume hemodialysis as before

## 2016-01-15 NOTE — Progress Notes (Signed)
Pharmacist - Prescriber Communication  Famotidine dose has been changed to Q48H due to ESRD CrCl less than 50 mL/min.  Shneur [place] A. [place], [place].D., BCPS Clinical Pharmacist 01/15/2016 0025

## 2016-01-15 NOTE — Progress Notes (Signed)
Patient discharged to home after dialysis, Porta Cath De-accessed after being flushed with heparin. Discharge instructions given with interpreter and patient denies pain at this time, follow up appointments given as ordered. IV discontinued site clean dry and intact.

## 2016-01-16 LAB — URINE CULTURE

## 2016-01-16 LAB — HEPATITIS B SURFACE ANTIGEN: HEP B S AG: NEGATIVE

## 2016-01-16 LAB — HEPATITIS B SURFACE ANTIBODY, QUANTITATIVE: Hep B S AB Quant (Post): <3.1 m[IU]/mL — ABNORMAL LOW (ref >9.9)

## 2016-01-19 ENCOUNTER — Encounter (INDEPENDENT_AMBULATORY_CARE_PROVIDER_SITE_OTHER): Payer: Self-pay

## 2016-01-19 ENCOUNTER — Encounter (INDEPENDENT_AMBULATORY_CARE_PROVIDER_SITE_OTHER): Payer: Self-pay | Admitting: Vascular Surgery

## 2016-01-19 LAB — CULTURE, BLOOD (ROUTINE X 2)
Culture: NO GROWTH
Culture: NO GROWTH

## 2016-01-19 LAB — ACID FAST CULTURE WITH REFLEXED SENSITIVITIES: ACID FAST CULTURE - AFSCU3: NEGATIVE

## 2016-01-19 NOTE — Progress Notes (Signed)
[place]  Telephone:(336) [phone] Fax:(336) [phone]  ID: Jamie Keller OB: [birth year]  MR#: [id number]  VCB#:[id number]  [name], MD as PCP - General (Family Medicine)  CHIEF COMPLAINT: Multiple Myeloma in relapse with multiple bony lytic lesions, now with end-stage renal disease on dialysis.   INTERVAL HISTORY: Patient returns to clinic today for further evaluation and consideration of cycle 1, day 15 of Kyporlis. She was admitted overnight to the hospital last week for concern of sepsis.  She continues to feel significantly weak and fatigued with a declining performance status, but otherwise is tolerating her treatments well. She is depressed. She has a poor appetite. She has no neurologic complaints. She denies any further fevers. She does not complain of any nausea or vomiting. She denies any chest pain or shortness of breath.  She denies any constipation or diarrhea. She has no urinary complaints. Patient offers no further specific complaints today.  REVIEW OF SYSTEMS:   Review of Systems  Constitutional: Positive for malaise/fatigue and weight loss. Negative for fever.  Respiratory: Negative.  Negative for cough and shortness of breath.   Cardiovascular: Negative.  Negative for chest pain.  Gastrointestinal: Negative.  Negative for abdominal pain, nausea and vomiting.  Musculoskeletal: Positive for back pain.  Neurological: Positive for weakness. Negative for tingling and focal weakness.  Psychiatric/Behavioral: Positive for depression. The patient is nervous/anxious.    As per HPI. Otherwise, a complete review of systems is negative.   PAST MEDICAL HISTORY: Past Medical History:  Diagnosis Date  . Diabetes mellitus without complication ([place])   . Hypertension   . Multiple myeloma ([place])   . Osteoarthritis   . Pneumonia   . Post-menopausal 2014  . Sepsis ([place])     PAST SURGICAL HISTORY: Past Surgical History:    Procedure Laterality Date  . CESAREAN SECTION     x2  . dialysis catheter placement    . PERIPHERAL VASCULAR CATHETERIZATION N/A 11/19/2015   Procedure: Dialysis/Perma Catheter Insertion;  Surgeon: Algernon Huxley, MD;  Location: [facility];  Service: Cardiovascular;  Laterality: N/A;  . PORTACATH PLACEMENT      FAMILY HISTORY: Reviewed and unchanged. No reported history of malignancy or chronic disease.     ADVANCED DIRECTIVES:    HEALTH MAINTENANCE: Social History  Substance Use Topics  . Smoking status: Never Smoker  . Smokeless tobacco: Never Used  . Alcohol use No     Allergies  Allergen Reactions  . No Known Allergies     Current Outpatient Prescriptions  Medication Sig Dispense Refill  . benzonatate (TESSALON) 200 MG capsule Take 200 mg by mouth 3 (three) times daily as needed for cough.    . cefUROXime (CEFTIN) 500 MG tablet Take 1 tablet (500 mg total) by mouth 2 (two) times daily with a meal. 14 tablet 0  . furosemide (LASIX) 80 MG tablet Take 1 tablet (80 mg total) by mouth daily. 30 tablet 0  . Insulin Detemir (LEVEMIR FLEXPEN) 100 UNIT/ML Pen Inject 5 Units into the skin daily at 10 pm.    . Oxycodone HCl 10 MG TABS Take 1 tablet (10 mg total) by mouth every 6 (six) hours as needed. (Patient taking differently: Take 10 mg by mouth every 6 (six) hours as needed (pain). ) 60 tablet 0  . promethazine (PHENERGAN) 25 MG tablet Take 25 mg by mouth every 6 (six) hours as needed for nausea or vomiting.    . verapamil (CALAN) 40  MG tablet Take 1 tablet (40 mg total) by mouth 2 (two) times daily. 60 tablet 0  . ferrous sulfate 325 (65 FE) MG tablet Take 325 mg by mouth daily with breakfast.    . megestrol (MEGACE) 40 MG tablet Take 1 tablet (40 mg total) by mouth daily. 30 tablet 2   No current facility-administered medications for this visit.    Facility-Administered Medications Ordered in Other Visits  Medication Dose Route Frequency Provider Last Rate Last  Dose  . sodium chloride flush (NS) 0.9 % injection 10 mL  10 mL Intracatheter PRN Lloyd Huger, MD   10 mL at 01/20/16 0855    OBJECTIVE: Vitals:   01/20/16 0930  BP: (!) 170/80  Pulse: (!) 109  Resp: 18  Temp: 97.8 F (36.6 C)     Body mass index is 20.22 kg/m.    ECOG FS:2 - Symptomatic, <50% confined to bed  General: Ill-appearing, no acute distress. Eyes: anicteric sclera. Lungs: Clear to auscultation bilaterally. Heart: Regular rate and rhythm. No rubs, murmurs, or gallops. Abdomen: Soft, nontender, nondistended. No organomegaly noted, normoactive bowel sounds. Musculoskeletal: No edema, cyanosis, or clubbing. Neuro: Alert, answering all questions appropriately. Cranial nerves grossly intact. Skin: No rashes or petechiae noted. Psych: Normal affect.   LAB RESULTS:  Lab Results  Component Value Date   NA 138 01/20/2016   K 4.1 01/20/2016   CL 101 01/20/2016   CO2 26 01/20/2016   GLUCOSE 153 (H) 01/20/2016   BUN 37 (H) 01/20/2016   CREATININE 4.64 (H) 01/20/2016   CALCIUM 8.6 (L) 01/20/2016   PROT 6.3 (L) 01/20/2016   ALBUMIN 2.8 (L) 01/20/2016   AST 24 01/20/2016   ALT 10 (L) 01/20/2016   ALKPHOS 105 01/20/2016   BILITOT 0.9 01/20/2016   GFRNONAA 10 (L) 01/20/2016   GFRAA 12 (L) 01/20/2016    Lab Results  Component Value Date   WBC 7.0 01/20/2016   NEUTROABS 5.2 01/20/2016   HGB 7.1 (L) 01/20/2016   HCT 20.9 (L) 01/20/2016   MCV 91.8 01/20/2016   PLT 116 (L) 01/20/2016   Lab Results  Component Value Date   TOTALPROTELP 6.0 01/13/2016   ALBUMINELP 3.1 01/13/2016   A1GS 0.4 01/13/2016   A2GS 1.0 01/13/2016   BETS 1.0 01/13/2016   GAMS 0.6 01/13/2016   MSPIKE 0.3 (H) 01/13/2016   SPEI Comment 01/13/2016     STUDIES: Ct Abdomen Pelvis Wo Contrast  Result Date: 01/01/2016 CLINICAL DATA:  Right flank pain for 1 week. No hematuria. History multiple myeloma. EXAM: CT ABDOMEN AND PELVIS WITHOUT CONTRAST TECHNIQUE: Multidetector CT imaging of  the abdomen and pelvis was performed following the standard protocol without IV contrast. COMPARISON:  11/14/2015 CT FINDINGS: Lower chest: Bilateral small pleural effusions with compressive atelectasis are identified stable on the right and increased on the left in the interval. Small right lower lobe calcified granuloma on series 2, image 13. Hepatobiliary: The unenhanced liver is unremarkable. Contracted appearing gallbladder which is slightly thickened in appearance as a result. There is a trace amount of free fluid/ascites overlying the right hepatic lobe. Pancreas: Slight decrease in peripancreatic inflammation since prior. Stranding along Gerota's fascia is present. No pseudocyst formation. No ductal dilatation. Spleen: Normal size spleen. Adrenals/Urinary Tract: The adrenal glands are normal. Mild nonspecific perinephric fat stranding is noted bilaterally. There is mild fullness of both renal collecting systems without obstructing calculi. There is no nephrolithiasis. No hydroureter. Findings may be related to chronic ectasia of the renal collecting  systems bilaterally. Stomach/Bowel: Normal bowel rotation. No bowel obstruction. Moderate colonic stool burden. Vascular/Lymphatic: Minimal atherosclerosis of the normal-sized abdominal aorta and right internal iliac arteries. Small phleboliths are seen bilaterally in the lower pelvis. Reproductive: There may be a small nabothian cyst or fibroid in the lower uterine segment series 2, image 79 accounting for hypodensity measuring 6 mm. Otherwise negative. Other: Moderate amount of free fluid in the pelvis increased since the interim. Musculoskeletal: Mixed ostia sclerotic and osteolytic abnormality is along the visualized dorsal spine, pelvis and sternum consistent with known history multiple myeloma. Stable T11 compression. IMPRESSION: 1. Mildly dilated renal collecting systems bilaterally without obstructing calculi nor hydroureter. Findings may be due to  chronic ectasia of the renal collecting system. 2. Slight interval increase in left pleural effusion with stable right small pleural effusion. 3. Slight decrease in peripancreatic inflammation. The increase in free fluid within the abdomen and pelvis may be sequela of previously described pancreatitis. 4. Diffuse sclerosis in numerous lytic lesions throughout the visualized osseous elements with chronic height loss of T11. This reflects the patient's known multiple myeloma. Electronically Signed   By: Ashley Royalty M.D.   On: 01/01/2016 14:46   Dg Chest Port 1 View  Result Date: 01/14/2016 CLINICAL DATA:  Respiratory distress EXAM: PORTABLE CHEST 1 VIEW COMPARISON:  12/04/2015 FINDINGS: Portable AP erect view of the chest. Right-sided central venous port tip overlies the cavoatrial junction. Left-sided central venous catheter tips overlie the right atrium with distal tip overlying the IVC right atrial junction. There are low lung volumes. There are small bilateral effusions. Bibasilar atelectasis or infiltrates. Mild cardiomegaly with central congestion and mild interstitial edema. No pneumothorax. IMPRESSION: 1. Low lung volumes with small bilateral effusion and bibasilar atelectasis or infiltrates 2. Mild cardiomegaly with central vascular congestion and diffuse interstitial edema. Electronically Signed   By: Donavan Foil M.D.   On: 01/14/2016 17:31   US Abdomen Limited Ruq  Result Date: 01/14/2016 CLINICAL DATA:  Sepsis, right upper quadrant pain for the past week EXAM: US ABDOMEN LIMITED - RIGHT UPPER QUADRANT COMPARISON:  CT scan of the abdomen and pelvis of January 01, 2016 FINDINGS: Gallbladder: The gallbladder is adequately distended. There is wall thickening to 5 mm. There is a small amount of pericholecystic fluid. There is no positive sonographic Murphy's sign. No stones or sludge are observed. Common bile duct: Diameter: 2 mm Liver: The hepatic echotexture is mildly increased. There is no focal  mass nor ductal dilation. The surface contour of the liver is smooth. IMPRESSION: Mild gallbladder wall thickening with pericholecystic fluid with no evidence of stones. The findings may reflect acalculous cholecystitis. A nuclear medicine hepatobiliary scan with gallbladder ejection fraction determination may be useful. Increased hepatic echotexture likely reflects fatty infiltrative change. No focal hepatic mass. Incidental note is made of a right pleural effusion. Electronically Signed   By: David  Martinique M.D.   On: 01/14/2016 17:24    ASSESSMENT: Multiple Myeloma in relapse with multiple bony lytic lesions, end-stage renal disease.   PLAN:    1. Multiple Myeloma in relapse with multiple bony lytic lesions: MRI, CT, bone scan results reviewed independently indicating progression of disease. Patient also has progressed end-stage renal disease, likely secondary to underlying myeloma. Patient's performance status has significantly declined, but she wishes to continue with aggressive treatment. Patient's kappa free light chain has trended down and is now [phone]. Proceed with cycle 1, day 15 of Kyporlis. If tolerated, patient will receive treatment on days 1, 2, 8,  9, 15, and 16. Kyporlis does not need to be dose reduced in the setting of end-stage renal disease and dialysis. Can consider Delton See given her renal failure. Dexamethasone has been discontinued given her difficult to control blood sugar. Because of patient's immigration status, she could not undergo transplant. Return to clinic in 2 weeks for consideration of cycle 2, day 1. 2. Back pain:  Continue current narcotics as prescribed. 3. End-stage renal disease: Likely secondary to progression of disease. Continue dialysis as above. 4. Anemia: Proceed with 1 unit packed red blood cells today as well. 5. Hyperglycemia: Patient's blood glucose has improved since initiating insulin. Continue monitoring and treatment by primary care.  6.  Thrombocytopenia: Multifactorial secondary to treatment as well as disease progression. Improved. 7. Depression: Patient has agreed to reinitiate Celexa 20 mg daily.  The entire visit was done in the presence of an interpreter.  Patient expressed understanding and was in agreement with this plan. She also understands that She can call clinic at any time with any questions, concerns, or complaints.    Lloyd Huger, MD   01/20/2016 4:01 PM   ROS

## 2016-01-20 ENCOUNTER — Inpatient Hospital Stay: Payer: Self-pay

## 2016-01-20 ENCOUNTER — Inpatient Hospital Stay (HOSPITAL_BASED_OUTPATIENT_CLINIC_OR_DEPARTMENT_OTHER): Payer: Self-pay | Admitting: Oncology

## 2016-01-20 VITALS — BP 170/80 | HR 109 | Temp 97.8°F | Resp 18 | Wt 110.6 lb

## 2016-01-20 VITALS — BP 139/76 | HR 112 | Temp 96.8°F | Resp 20

## 2016-01-20 DIAGNOSIS — C9002 Multiple myeloma in relapse: Secondary | ICD-10-CM

## 2016-01-20 DIAGNOSIS — F329 Major depressive disorder, single episode, unspecified: Secondary | ICD-10-CM

## 2016-01-20 DIAGNOSIS — M1990 Unspecified osteoarthritis, unspecified site: Secondary | ICD-10-CM

## 2016-01-20 DIAGNOSIS — E1122 Type 2 diabetes mellitus with diabetic chronic kidney disease: Secondary | ICD-10-CM

## 2016-01-20 DIAGNOSIS — I120 Hypertensive chronic kidney disease with stage 5 chronic kidney disease or end stage renal disease: Secondary | ICD-10-CM

## 2016-01-20 DIAGNOSIS — J90 Pleural effusion, not elsewhere classified: Secondary | ICD-10-CM

## 2016-01-20 DIAGNOSIS — Z79899 Other long term (current) drug therapy: Secondary | ICD-10-CM

## 2016-01-20 DIAGNOSIS — R531 Weakness: Secondary | ICD-10-CM

## 2016-01-20 DIAGNOSIS — Z992 Dependence on renal dialysis: Secondary | ICD-10-CM

## 2016-01-20 DIAGNOSIS — N186 End stage renal disease: Secondary | ICD-10-CM

## 2016-01-20 DIAGNOSIS — D696 Thrombocytopenia, unspecified: Secondary | ICD-10-CM

## 2016-01-20 DIAGNOSIS — M549 Dorsalgia, unspecified: Secondary | ICD-10-CM

## 2016-01-20 DIAGNOSIS — I517 Cardiomegaly: Secondary | ICD-10-CM

## 2016-01-20 DIAGNOSIS — Z8701 Personal history of pneumonia (recurrent): Secondary | ICD-10-CM

## 2016-01-20 DIAGNOSIS — R630 Anorexia: Secondary | ICD-10-CM

## 2016-01-20 DIAGNOSIS — R5383 Other fatigue: Secondary | ICD-10-CM

## 2016-01-20 LAB — CBC WITH DIFFERENTIAL/PLATELET
BASOS ABS: 0 10*3/uL (ref 0–0.1)
BASOS PCT: 1 %
EOS ABS: 0 10*3/uL (ref 0–0.7)
EOS PCT: 0 %
HCT: 20.9 % — ABNORMAL LOW (ref 35.0–47.0)
Hemoglobin: 7.1 g/dL — ABNORMAL LOW (ref 12.0–16.0)
Lymphocytes Relative: 10 %
Lymphs Abs: 0.7 10*3/uL — ABNORMAL LOW (ref 1.0–3.6)
MCH: 31.1 pg (ref 26.0–34.0)
MCHC: 33.8 g/dL (ref 32.0–36.0)
MCV: 91.8 fL (ref 80.0–100.0)
MONO ABS: 1 10*3/uL — AB (ref 0.2–0.9)
Monocytes Relative: 15 %
Neutro Abs: 5.2 10*3/uL (ref 1.4–6.5)
Neutrophils Relative %: 74 %
PLATELETS: 116 10*3/uL — AB (ref 150–440)
RBC: 2.27 MIL/uL — AB (ref 3.80–5.20)
RDW: 21 % — AB (ref 11.5–14.5)
WBC: 7 10*3/uL (ref 3.6–11.0)

## 2016-01-20 LAB — COMPREHENSIVE METABOLIC PANEL
ALBUMIN: 2.8 g/dL — AB (ref 3.5–5.0)
ALT: 10 U/L — AB (ref 14–54)
AST: 24 U/L (ref 15–41)
Alkaline Phosphatase: 105 U/L (ref 38–126)
Anion gap: 11 (ref 5–15)
BUN: 37 mg/dL — AB (ref 6–20)
CHLORIDE: 101 mmol/L (ref 101–111)
CO2: 26 mmol/L (ref 22–32)
Calcium: 8.6 mg/dL — ABNORMAL LOW (ref 8.9–10.3)
Creatinine, Ser: 4.64 mg/dL — ABNORMAL HIGH (ref 0.44–1.00)
GFR calc Af Amer: 12 mL/min — ABNORMAL LOW (ref >60)
GFR, EST NON AFRICAN AMERICAN: 10 mL/min — AB (ref >60)
Glucose, Bld: 153 mg/dL — ABNORMAL HIGH (ref 65–99)
POTASSIUM: 4.1 mmol/L (ref 3.5–5.1)
SODIUM: 138 mmol/L (ref 135–145)
Total Bilirubin: 0.9 mg/dL (ref 0.3–1.2)
Total Protein: 6.3 g/dL — ABNORMAL LOW (ref 6.5–8.1)

## 2016-01-20 LAB — SAMPLE TO BLOOD BANK

## 2016-01-20 LAB — PREPARE RBC (CROSSMATCH)

## 2016-01-20 MED ORDER — SODIUM CHLORIDE 0.9 % IV SOLN
250.0000 mL | Freq: Once | INTRAVENOUS | Status: AC
  Administered 2016-01-20: 250 mL via INTRAVENOUS
  Filled 2016-01-20: qty 250

## 2016-01-20 MED ORDER — SODIUM CHLORIDE 0.9 % IV SOLN
Freq: Once | INTRAVENOUS | Status: AC
  Administered 2016-01-20: 11:00:00 via INTRAVENOUS
  Filled 2016-01-20: qty 1000

## 2016-01-20 MED ORDER — DEXTROSE 5 % IV SOLN
20.0000 mg/m2 | Freq: Once | INTRAVENOUS | Status: AC
  Administered 2016-01-20: 30 mg via INTRAVENOUS
  Filled 2016-01-20: qty 15

## 2016-01-20 MED ORDER — ACETAMINOPHEN 325 MG PO TABS
650.0000 mg | ORAL_TABLET | Freq: Once | ORAL | Status: AC
  Administered 2016-01-20: 650 mg via ORAL
  Filled 2016-01-20: qty 2

## 2016-01-20 MED ORDER — SODIUM CHLORIDE 0.9% FLUSH
10.0000 mL | INTRAVENOUS | Status: DC | PRN
  Administered 2016-01-20: 10 mL
  Filled 2016-01-20: qty 10

## 2016-01-20 MED ORDER — MEGESTROL ACETATE 40 MG PO TABS: 40.0000 mg | ORAL_TABLET | Freq: Every day | ORAL | 2 refills | Status: DC

## 2016-01-20 MED ORDER — CITALOPRAM HYDROBROMIDE 20 MG PO TABS: 20.0000 mg | ORAL_TABLET | Freq: Every day | ORAL | 5 refills | Status: DC

## 2016-01-20 MED ORDER — PROCHLORPERAZINE MALEATE 10 MG PO TABS
10.0000 mg | ORAL_TABLET | Freq: Once | ORAL | Status: AC
  Administered 2016-01-20: 10 mg via ORAL
  Filled 2016-01-20: qty 1

## 2016-01-20 MED ORDER — HEPARIN SOD (PORK) LOCK FLUSH 100 UNIT/ML IV SOLN
500.0000 [IU] | Freq: Once | INTRAVENOUS | Status: AC | PRN
  Administered 2016-01-20: 500 [IU]
  Filled 2016-01-20: qty 5

## 2016-01-20 MED ORDER — DIPHENHYDRAMINE HCL 50 MG/ML IJ SOLN
25.0000 mg | Freq: Once | INTRAMUSCULAR | Status: AC
  Administered 2016-01-20: 25 mg via INTRAVENOUS
  Filled 2016-01-20: qty 1

## 2016-01-20 NOTE — Progress Notes (Signed)
Complains of nausea, vomiting, diarrhea which started last night. Continues to have swelling in both feet and states that it is painful to walk. Pt has been taking 80mg  lasix daily and states does not help with swelling.

## 2016-01-21 ENCOUNTER — Inpatient Hospital Stay: Payer: Self-pay

## 2016-01-21 ENCOUNTER — Ambulatory Visit: Payer: Self-pay

## 2016-01-21 VITALS — BP 150/75 | HR 118 | Temp 96.7°F | Resp 18

## 2016-01-21 DIAGNOSIS — C9002 Multiple myeloma in relapse: Secondary | ICD-10-CM

## 2016-01-21 LAB — TYPE AND SCREEN
ABO/RH(D): B POS
Antibody Screen: NEGATIVE
Unit division: 0

## 2016-01-21 LAB — PROTEIN ELECTROPHORESIS, SERUM
A/G RATIO SPE: 1 (ref 0.7–1.7)
ALPHA-1-GLOBULIN: 0.4 g/dL (ref 0.0–0.4)
ALPHA-2-GLOBULIN: 0.9 g/dL (ref 0.4–1.0)
Albumin ELP: 2.9 g/dL (ref 2.9–4.4)
BETA GLOBULIN: 1 g/dL (ref 0.7–1.3)
GLOBULIN, TOTAL: 2.9 g/dL (ref 2.2–3.9)
Gamma Globulin: 0.6 g/dL (ref 0.4–1.8)
M-SPIKE, %: 0.2 g/dL — AB
TOTAL PROTEIN ELP: 5.8 g/dL — AB (ref 6.0–8.5)

## 2016-01-21 LAB — KAPPA/LAMBDA LIGHT CHAINS
KAPPA, LAMDA LIGHT CHAIN RATIO: 770.05 — AB (ref 0.26–1.65)
Kappa free light chain: 11319.8 mg/L — ABNORMAL HIGH (ref 3.3–19.4)
Lambda free light chains: 14.7 mg/L (ref 5.7–26.3)

## 2016-01-21 MED ORDER — SODIUM CHLORIDE 0.9% FLUSH
10.0000 mL | INTRAVENOUS | Status: DC | PRN
  Administered 2016-01-21: 10 mL
  Filled 2016-01-21: qty 10

## 2016-01-21 MED ORDER — SODIUM CHLORIDE 0.9 % IV SOLN
Freq: Once | INTRAVENOUS | Status: AC
  Administered 2016-01-21: 09:00:00 via INTRAVENOUS
  Filled 2016-01-21: qty 1000

## 2016-01-21 MED ORDER — PROCHLORPERAZINE MALEATE 10 MG PO TABS
10.0000 mg | ORAL_TABLET | Freq: Once | ORAL | Status: AC
  Administered 2016-01-21: 10 mg via ORAL
  Filled 2016-01-21: qty 1

## 2016-01-21 MED ORDER — HEPARIN SOD (PORK) LOCK FLUSH 100 UNIT/ML IV SOLN
500.0000 [IU] | Freq: Once | INTRAVENOUS | Status: AC | PRN
  Administered 2016-01-21: 500 [IU]
  Filled 2016-01-21: qty 5

## 2016-01-21 MED ORDER — DEXTROSE 5 % IV SOLN
20.0000 mg/m2 | Freq: Once | INTRAVENOUS | Status: AC
  Administered 2016-01-21: 30 mg via INTRAVENOUS
  Filled 2016-01-21: qty 15

## 2016-01-21 MED ORDER — DEXTROSE 5 % IV SOLN
Freq: Once | INTRAVENOUS | Status: AC
  Administered 2016-01-21: 11:00:00 via INTRAVENOUS
  Filled 2016-01-21: qty 1000

## 2016-01-30 LAB — PROTEIN ELECTROPHORESIS, SERUM
A/G RATIO SPE: 0.8 (ref 0.7–1.7)
ALBUMIN ELP: 2.9 g/dL (ref 2.9–4.4)
ALPHA-2-GLOBULIN: 1.1 g/dL — AB (ref 0.4–1.0)
Alpha-1-Globulin: 0.3 g/dL (ref 0.0–0.4)
Beta Globulin: 1.4 g/dL — ABNORMAL HIGH (ref 0.7–1.3)
GLOBULIN, TOTAL: 3.7 g/dL (ref 2.2–3.9)
Gamma Globulin: 0.8 g/dL (ref 0.4–1.8)
M-Spike, %: 0.6 g/dL — ABNORMAL HIGH
Total Protein ELP: 6.6 g/dL (ref 6.0–8.5)

## 2016-01-30 LAB — KAPPA/LAMBDA LIGHT CHAINS
Kappa free light chain: 18408.3 mg/L — ABNORMAL HIGH (ref 3.3–19.4)
Kappa, lambda light chain ratio: 684.32 — ABNORMAL HIGH (ref 0.26–1.65)
Lambda free light chains: 26.9 mg/L — ABNORMAL HIGH (ref 5.7–26.3)

## 2016-02-02 NOTE — Progress Notes (Signed)
[facility]  Telephone:(336) [phone] Fax:(336) [phone]  ID: Jamie Keller OB: 1965/12/26  MR#: [id number]  TXM#:[id number]  [name], MD as PCP - General (Family Medicine)  CHIEF COMPLAINT: Multiple Myeloma in relapse with multiple bony lytic lesions, now with end-stage renal disease on dialysis.   INTERVAL HISTORY: Patient returns to clinic today for further evaluation and consideration of cycle 2, day 1 of Kyporlis. She continues to feel significantly weak and fatigued with a declining performance status, but otherwise is tolerating her treatments well. She is depressed. She has a poor appetite. She has no neurologic complaints. She denies any further fevers. She does not complain of any nausea or vomiting. She denies any chest pain or shortness of breath.  She denies any constipation or diarrhea. She has no urinary complaints. Patient offers no further specific complaints today.  REVIEW OF SYSTEMS:   Review of Systems  Constitutional: Positive for malaise/fatigue and weight loss. Negative for fever.  Respiratory: Negative.  Negative for cough and shortness of breath.   Cardiovascular: Negative.  Negative for chest pain.  Gastrointestinal: Negative.  Negative for abdominal pain, nausea and vomiting.  Musculoskeletal: Positive for back pain.  Neurological: Positive for weakness. Negative for tingling and focal weakness.  Psychiatric/Behavioral: Positive for depression. The patient is nervous/anxious.    As per HPI. Otherwise, a complete review of systems is negative.   PAST MEDICAL HISTORY: Past Medical History:  Diagnosis Date  . Diabetes mellitus without complication ([place])   . Hypertension   . Multiple myeloma ([place])   . Osteoarthritis   . Pneumonia   . Post-menopausal 2014  . Sepsis ([place])     PAST SURGICAL HISTORY: Past Surgical History:  Procedure Laterality Date  . CESAREAN SECTION     x2  . dialysis catheter  placement    . PERIPHERAL VASCULAR CATHETERIZATION N/A 11/19/2015   Procedure: Dialysis/Perma Catheter Insertion;  Surgeon: Algernon Huxley, MD;  Location: [facility];  Service: Cardiovascular;  Laterality: N/A;  . PORTACATH PLACEMENT      FAMILY HISTORY: Reviewed and unchanged. No reported history of malignancy or chronic disease.     ADVANCED DIRECTIVES:    HEALTH MAINTENANCE: Social History  Substance Use Topics  . Smoking status: Never Smoker  . Smokeless tobacco: Never Used  . Alcohol use No     Allergies  Allergen Reactions  . No Known Allergies     Current Outpatient Prescriptions  Medication Sig Dispense Refill  . benzonatate (TESSALON) 200 MG capsule Take 200 mg by mouth 3 (three) times daily as needed for cough.    . citalopram (CELEXA) 20 MG tablet Take 1 tablet (20 mg total) by mouth daily. 30 tablet 5  . ferrous sulfate 325 (65 FE) MG tablet Take 325 mg by mouth daily with breakfast.    . furosemide (LASIX) 80 MG tablet Take 1 tablet (80 mg total) by mouth daily. 30 tablet 0  . Insulin Detemir (LEVEMIR FLEXPEN) 100 UNIT/ML Pen Inject 5 Units into the skin daily at 10 pm.    . megestrol (MEGACE) 40 MG tablet Take 1 tablet (40 mg total) by mouth daily. 30 tablet 2  . Oxycodone HCl 10 MG TABS Take 1 tablet (10 mg total) by mouth every 6 (six) hours as needed. (Patient taking differently: Take 10 mg by mouth every 6 (six) hours as needed (pain). ) 60 tablet 0  . promethazine (PHENERGAN) 25 MG tablet Take 25 mg by mouth  every 6 (six) hours as needed for nausea or vomiting.    . verapamil (CALAN) 40 MG tablet Take 1 tablet (40 mg total) by mouth 2 (two) times daily. 60 tablet 0  . dronabinol (MARINOL) 2.5 MG capsule Take 1 capsule (2.5 mg total) by mouth 2 (two) times daily before a meal. 60 capsule 0   No current facility-administered medications for this visit.     OBJECTIVE: Vitals:   02/03/16 1011  BP: 103/62  Pulse: 86  Resp: 18  Temp: 98.9 F (37.2  C)     Body mass index is 18.47 kg/m.    ECOG FS:2 - Symptomatic, <50% confined to bed  General: Ill-appearing, no acute distress. Eyes: anicteric sclera. Lungs: Clear to auscultation bilaterally. Heart: Regular rate and rhythm. No rubs, murmurs, or gallops. Abdomen: Soft, nontender, nondistended. No organomegaly noted, normoactive bowel sounds. Musculoskeletal: No edema, cyanosis, or clubbing. Neuro: Alert, answering all questions appropriately. Cranial nerves grossly intact. Skin: No rashes or petechiae noted. Psych: Normal affect.   LAB RESULTS:  Lab Results  Component Value Date   NA 136 02/03/2016   K 3.9 02/03/2016   CL 95 (L) 02/03/2016   CO2 30 02/03/2016   GLUCOSE 123 (H) 02/03/2016   BUN 33 (H) 02/03/2016   CREATININE 3.46 (H) 02/03/2016   CALCIUM 8.6 (L) 02/03/2016   PROT 6.7 02/03/2016   ALBUMIN 3.2 (L) 02/03/2016   AST 32 02/03/2016   ALT 16 02/03/2016   ALKPHOS 108 02/03/2016   BILITOT 1.2 02/03/2016   GFRNONAA 14 (L) 02/03/2016   GFRAA 17 (L) 02/03/2016    Lab Results  Component Value Date   WBC 5.0 02/03/2016   NEUTROABS 3.0 02/03/2016   HGB 7.8 (L) 02/03/2016   HCT 23.2 (L) 02/03/2016   MCV 91.9 02/03/2016   PLT 139 (L) 02/03/2016   Lab Results  Component Value Date   TOTALPROTELP 5.8 (L) 01/20/2016   ALBUMINELP 2.9 01/20/2016   A1GS 0.4 01/20/2016   A2GS 0.9 01/20/2016   BETS 1.0 01/20/2016   GAMS 0.6 01/20/2016   MSPIKE 0.2 (H) 01/20/2016   SPEI Comment 01/20/2016     STUDIES: Dg Chest Port 1 View  Result Date: 01/14/2016 CLINICAL DATA:  Respiratory distress EXAM: PORTABLE CHEST 1 VIEW COMPARISON:  12/04/2015 FINDINGS: Portable AP erect view of the chest. Right-sided central venous port tip overlies the cavoatrial junction. Left-sided central venous catheter tips overlie the right atrium with distal tip overlying the IVC right atrial junction. There are low lung volumes. There are small bilateral effusions. Bibasilar atelectasis or  infiltrates. Mild cardiomegaly with central congestion and mild interstitial edema. No pneumothorax. IMPRESSION: 1. Low lung volumes with small bilateral effusion and bibasilar atelectasis or infiltrates 2. Mild cardiomegaly with central vascular congestion and diffuse interstitial edema. Electronically Signed   By: Donavan Foil M.D.   On: 01/14/2016 17:31   US Abdomen Limited Ruq  Result Date: 01/14/2016 CLINICAL DATA:  Sepsis, right upper quadrant pain for the past week EXAM: US ABDOMEN LIMITED - RIGHT UPPER QUADRANT COMPARISON:  CT scan of the abdomen and pelvis of January 01, 2016 FINDINGS: Gallbladder: The gallbladder is adequately distended. There is wall thickening to 5 mm. There is a small amount of pericholecystic fluid. There is no positive sonographic Murphy's sign. No stones or sludge are observed. Common bile duct: Diameter: 2 mm Liver: The hepatic echotexture is mildly increased. There is no focal mass nor ductal dilation. The surface contour of the liver is smooth. IMPRESSION:  Mild gallbladder wall thickening with pericholecystic fluid with no evidence of stones. The findings may reflect acalculous cholecystitis. A nuclear medicine hepatobiliary scan with gallbladder ejection fraction determination may be useful. Increased hepatic echotexture likely reflects fatty infiltrative change. No focal hepatic mass. Incidental note is made of a right pleural effusion. Electronically Signed   By: David  Martinique M.D.   On: 01/14/2016 17:24    ASSESSMENT: Multiple Myeloma in relapse with multiple bony lytic lesions, end-stage renal disease.   PLAN:    1. Multiple Myeloma in relapse with multiple bony lytic lesions: MRI, CT, bone scan results reviewed independently indicating progression of disease. Patient also has progressed end-stage renal disease, likely secondary to underlying myeloma. Patient's performance status has significantly declined, but she wishes to continue with aggressive treatment.  Patient's kappa free light chain has trended down from greater than [phone],[phone].  Proceed with cycle 2, day 1 of Kyporlis. Given patient's decreased performance status, we will not increase dose of Kyporlis. Patient will receive treatment on days 1, 2, 8, 9, 15, and 16. Kyporlis does not need to be dose reduced in the setting of end-stage renal disease and dialysis. Can consider Delton See given her renal failure. Dexamethasone has been discontinued given her difficult to control blood sugar. Because of patient's immigration status, she could not undergo transplant. Return to clinic tomorrow for consideration of cycle 2, day 2 and then in 1 week for consideration of cycle 2, day 8.  2. Back pain:  Continue current narcotics as prescribed. 3. End-stage renal disease: Likely secondary to progression of disease. Continue dialysis on Monday, Wednesdays and Fridays. 4. Anemia: Hemoglobin continues to be decreased, but improved with recent transfusion.  5. Hyperglycemia: Patient's blood glucose has improved since initiating insulin. Continue monitoring and treatment by primary care. Continue to hold dexamethasone as above. 6. Thrombocytopenia: Multifactorial secondary to treatment as well as disease progression. Improved. 7. Depression: Patient has agreed to reinitiate Celexa 20 mg daily.  The entire visit was done in the presence of an interpreter.  Patient expressed understanding and was in agreement with this plan. She also understands that She can call clinic at any time with any questions, concerns, or complaints.    Lloyd Huger, MD   02/07/2016 7:30 AM   ROS

## 2016-02-03 ENCOUNTER — Inpatient Hospital Stay (HOSPITAL_BASED_OUTPATIENT_CLINIC_OR_DEPARTMENT_OTHER): Payer: Self-pay | Admitting: Oncology

## 2016-02-03 ENCOUNTER — Inpatient Hospital Stay: Payer: Self-pay

## 2016-02-03 VITALS — BP 103/62 | HR 86 | Temp 98.9°F | Resp 18 | Wt 101.0 lb

## 2016-02-03 DIAGNOSIS — C9002 Multiple myeloma in relapse: Secondary | ICD-10-CM

## 2016-02-03 DIAGNOSIS — R531 Weakness: Secondary | ICD-10-CM

## 2016-02-03 DIAGNOSIS — Z79899 Other long term (current) drug therapy: Secondary | ICD-10-CM

## 2016-02-03 DIAGNOSIS — E1122 Type 2 diabetes mellitus with diabetic chronic kidney disease: Secondary | ICD-10-CM

## 2016-02-03 DIAGNOSIS — R630 Anorexia: Secondary | ICD-10-CM

## 2016-02-03 DIAGNOSIS — M1990 Unspecified osteoarthritis, unspecified site: Secondary | ICD-10-CM

## 2016-02-03 DIAGNOSIS — R5383 Other fatigue: Secondary | ICD-10-CM

## 2016-02-03 DIAGNOSIS — N186 End stage renal disease: Secondary | ICD-10-CM

## 2016-02-03 DIAGNOSIS — J90 Pleural effusion, not elsewhere classified: Secondary | ICD-10-CM

## 2016-02-03 DIAGNOSIS — M549 Dorsalgia, unspecified: Secondary | ICD-10-CM

## 2016-02-03 DIAGNOSIS — D329 Benign neoplasm of meninges, unspecified: Secondary | ICD-10-CM

## 2016-02-03 DIAGNOSIS — C9001 Multiple myeloma in remission: Secondary | ICD-10-CM

## 2016-02-03 DIAGNOSIS — D649 Anemia, unspecified: Secondary | ICD-10-CM

## 2016-02-03 DIAGNOSIS — D696 Thrombocytopenia, unspecified: Secondary | ICD-10-CM

## 2016-02-03 DIAGNOSIS — Z8701 Personal history of pneumonia (recurrent): Secondary | ICD-10-CM

## 2016-02-03 DIAGNOSIS — Z992 Dependence on renal dialysis: Secondary | ICD-10-CM

## 2016-02-03 DIAGNOSIS — I517 Cardiomegaly: Secondary | ICD-10-CM

## 2016-02-03 DIAGNOSIS — I120 Hypertensive chronic kidney disease with stage 5 chronic kidney disease or end stage renal disease: Secondary | ICD-10-CM

## 2016-02-03 LAB — CBC WITH DIFFERENTIAL/PLATELET
BASOS ABS: 0 10*3/uL (ref 0–0.1)
BASOS PCT: 1 %
EOS PCT: 1 %
Eosinophils Absolute: 0 10*3/uL (ref 0–0.7)
HEMATOCRIT: 23.2 % — AB (ref 35.0–47.0)
Hemoglobin: 7.8 g/dL — ABNORMAL LOW (ref 12.0–16.0)
Lymphocytes Relative: 17 %
Lymphs Abs: 0.9 10*3/uL — ABNORMAL LOW (ref 1.0–3.6)
MCH: 31 pg (ref 26.0–34.0)
MCHC: 33.8 g/dL (ref 32.0–36.0)
MCV: 91.9 fL (ref 80.0–100.0)
MONO ABS: 1.1 10*3/uL — AB (ref 0.2–0.9)
MONOS PCT: 22 %
Neutro Abs: 3 10*3/uL (ref 1.4–6.5)
Neutrophils Relative %: 59 %
PLATELETS: 139 10*3/uL — AB (ref 150–440)
RBC: 2.53 MIL/uL — ABNORMAL LOW (ref 3.80–5.20)
RDW: 20.9 % — AB (ref 11.5–14.5)
WBC: 5 10*3/uL (ref 3.6–11.0)

## 2016-02-03 LAB — COMPREHENSIVE METABOLIC PANEL
ALT: 16 U/L (ref 14–54)
ANION GAP: 11 (ref 5–15)
AST: 32 U/L (ref 15–41)
Albumin: 3.2 g/dL — ABNORMAL LOW (ref 3.5–5.0)
Alkaline Phosphatase: 108 U/L (ref 38–126)
BILIRUBIN TOTAL: 1.2 mg/dL (ref 0.3–1.2)
BUN: 33 mg/dL — AB (ref 6–20)
CHLORIDE: 95 mmol/L — AB (ref 101–111)
CO2: 30 mmol/L (ref 22–32)
Calcium: 8.6 mg/dL — ABNORMAL LOW (ref 8.9–10.3)
Creatinine, Ser: 3.46 mg/dL — ABNORMAL HIGH (ref 0.44–1.00)
GFR calc Af Amer: 17 mL/min — ABNORMAL LOW (ref >60)
GFR, EST NON AFRICAN AMERICAN: 14 mL/min — AB (ref >60)
Glucose, Bld: 123 mg/dL — ABNORMAL HIGH (ref 65–99)
POTASSIUM: 3.9 mmol/L (ref 3.5–5.1)
Sodium: 136 mmol/L (ref 135–145)
TOTAL PROTEIN: 6.7 g/dL (ref 6.5–8.1)

## 2016-02-03 MED ORDER — SODIUM CHLORIDE 0.9 % IV SOLN
Freq: Once | INTRAVENOUS | Status: AC
  Administered 2016-02-03: 11:00:00 via INTRAVENOUS
  Filled 2016-02-03: qty 1000

## 2016-02-03 MED ORDER — CARFILZOMIB CHEMO INJECTION 60 MG
20.0000 mg/m2 | Freq: Once | INTRAVENOUS | Status: AC
  Administered 2016-02-03: 30 mg via INTRAVENOUS
  Filled 2016-02-03: qty 15

## 2016-02-03 MED ORDER — DRONABINOL 2.5 MG PO CAPS: 2.5000 mg | ORAL_CAPSULE | Freq: Two times a day (BID) | ORAL | 0 refills | Status: DC

## 2016-02-03 MED ORDER — SODIUM CHLORIDE 0.9 % IV SOLN
INTRAVENOUS | Status: DC
  Administered 2016-02-03: 12:00:00 via INTRAVENOUS
  Filled 2016-02-03: qty 1000

## 2016-02-03 MED ORDER — PROCHLORPERAZINE MALEATE 10 MG PO TABS
10.0000 mg | ORAL_TABLET | Freq: Once | ORAL | Status: AC
  Administered 2016-02-03: 10 mg via ORAL
  Filled 2016-02-03: qty 1

## 2016-02-03 MED ORDER — HEPARIN SOD (PORK) LOCK FLUSH 100 UNIT/ML IV SOLN
500.0000 [IU] | Freq: Once | INTRAVENOUS | Status: AC | PRN
  Administered 2016-02-03: 500 [IU]
  Filled 2016-02-03: qty 5

## 2016-02-03 NOTE — Progress Notes (Signed)
Kyprolis dose is 20mg /m2. Dose for cycle 2 of this drug is usuallly increased to 27mg /m2. Per MD would like to continue current dose of 20mg /m2

## 2016-02-03 NOTE — Progress Notes (Signed)
Offers no complaints. Feeling well. 

## 2016-02-04 ENCOUNTER — Inpatient Hospital Stay: Payer: Self-pay

## 2016-02-04 VITALS — BP 118/83 | HR 102 | Temp 97.0°F | Resp 20

## 2016-02-04 DIAGNOSIS — C9002 Multiple myeloma in relapse: Secondary | ICD-10-CM

## 2016-02-04 MED ORDER — DEXTROSE 5 % IV SOLN
20.0000 mg/m2 | Freq: Once | INTRAVENOUS | Status: AC
  Administered 2016-02-04: 30 mg via INTRAVENOUS
  Filled 2016-02-04: qty 15

## 2016-02-04 MED ORDER — SODIUM CHLORIDE 0.9% FLUSH
10.0000 mL | INTRAVENOUS | Status: DC | PRN
  Administered 2016-02-04: 10 mL
  Filled 2016-02-04: qty 10

## 2016-02-04 MED ORDER — HEPARIN SOD (PORK) LOCK FLUSH 100 UNIT/ML IV SOLN
500.0000 [IU] | Freq: Once | INTRAVENOUS | Status: AC | PRN
  Administered 2016-02-04: 500 [IU]
  Filled 2016-02-04: qty 5

## 2016-02-04 MED ORDER — SODIUM CHLORIDE 0.9 % IV SOLN
Freq: Once | INTRAVENOUS | Status: AC
  Administered 2016-02-04: 10:00:00 via INTRAVENOUS
  Filled 2016-02-04: qty 1000

## 2016-02-04 MED ORDER — PROCHLORPERAZINE MALEATE 10 MG PO TABS
10.0000 mg | ORAL_TABLET | Freq: Once | ORAL | Status: AC
  Administered 2016-02-04: 10 mg via ORAL
  Filled 2016-02-04: qty 1

## 2016-02-04 MED ORDER — SODIUM CHLORIDE 0.9 % IV SOLN
Freq: Once | INTRAVENOUS | Status: AC
  Administered 2016-02-04: 11:00:00 via INTRAVENOUS
  Filled 2016-02-04: qty 1000

## 2016-02-05 ENCOUNTER — Other Ambulatory Visit: Payer: Self-pay | Admitting: *Deleted

## 2016-02-05 ENCOUNTER — Telehealth: Payer: Self-pay | Admitting: *Deleted

## 2016-02-05 DIAGNOSIS — C9002 Multiple myeloma in relapse: Secondary | ICD-10-CM

## 2016-02-05 MED ORDER — DRONABINOL 2.5 MG PO CAPS: 2.5000 mg | ORAL_CAPSULE | Freq: Two times a day (BID) | ORAL | 0 refills | Status: DC

## 2016-02-05 NOTE — Telephone Encounter (Signed)
Called to inform pt that prescription for marinol is ready for pickup since we were unable to call in prescription due to it being a controlled substance. While on the phone pt questioned if needed to take BP meds since her ears felt "clogged." Informed pt that last BP on 11/28 was normal. It was advised that she could try rubbing alcohol to dry fluid in ears if needed but pt would have to contact PCP to discuss the need for BP meds further. Pt verbalized understanding. Spoke with pt via interpreter.

## 2016-02-06 ENCOUNTER — Other Ambulatory Visit: Payer: Self-pay | Admitting: *Deleted

## 2016-02-06 DIAGNOSIS — D649 Anemia, unspecified: Secondary | ICD-10-CM

## 2016-02-09 NOTE — Progress Notes (Signed)
[facility]  [phone] [phone]  ID: [name] OB: 09/24/1965  MR#: [id number]  NKN#:[id number]  [name], MD as PCP - General (Family Medicine)  CHIEF COMPLAINT: Multiple Myeloma in relapse with multiple bony lytic lesions, now with end-stage renal disease on dialysis.   INTERVAL HISTORY: Patient returns to clinic today for further evaluation and consideration of cycle 2, day 8 of Kyporlis. She continues to feel significantly weak and fatigued with a declining performance status, but otherwise is tolerating her treatments well. She is depressed. She has a poor appetite and continues to lose weight. She has no neurologic complaints. She denies any further fevers. She does not complain of any nausea or vomiting. She denies any chest pain or shortness of breath.  She denies any constipation or diarrhea. She has no urinary complaints. Patient offers no further specific complaints today.  REVIEW OF SYSTEMS:   Review of Systems  Constitutional: Positive for malaise/fatigue and weight loss. Negative for fever.  Respiratory: Negative.  Negative for cough and shortness of breath.   Cardiovascular: Negative.  Negative for chest pain.  Gastrointestinal: Negative.  Negative for abdominal pain, nausea and vomiting.  Musculoskeletal: Positive for back pain.  Neurological: Positive for weakness. Negative for tingling and focal weakness.  Psychiatric/Behavioral: Positive for depression. The patient is nervous/anxious.    As per HPI. Otherwise, a complete review of systems is negative.   PAST MEDICAL HISTORY: Past Medical History:  Diagnosis Date  . Diabetes mellitus without complication ([place])   . Hypertension   . Multiple myeloma ([place])   . Osteoarthritis   . Pneumonia   . Post-menopausal 2014  . Sepsis ([place])     PAST SURGICAL HISTORY: Past Surgical History:  Procedure Laterality Date  . CESAREAN SECTION      x2  . dialysis catheter placement    . PERIPHERAL VASCULAR CATHETERIZATION N/A 11/19/2015   Procedure: Dialysis/Perma Catheter Insertion;  Surgeon: Algernon Huxley, MD;  Location: [facility];  Service: Cardiovascular;  Laterality: N/A;  . PORTACATH PLACEMENT      FAMILY HISTORY: Reviewed and unchanged. No reported history of malignancy or chronic disease.     ADVANCED DIRECTIVES:    HEALTH MAINTENANCE: Social History  Substance Use Topics  . Smoking status: Never Smoker  . Smokeless tobacco: Never Used  . Alcohol use No     Allergies  Allergen Reactions  . No Known Allergies     Current Outpatient Prescriptions  Medication Sig Dispense Refill  . citalopram (CELEXA) 20 MG tablet Take 1 tablet (20 mg total) by mouth daily. 30 tablet 5  . dronabinol (MARINOL) 2.5 MG capsule Take 1 capsule (2.5 mg total) by mouth 2 (two) times daily before a meal. 60 capsule 0  . ferrous sulfate 325 (65 FE) MG tablet Take 325 mg by mouth daily with breakfast.    . furosemide (LASIX) 80 MG tablet Take 1 tablet (80 mg total) by mouth daily. 30 tablet 0  . Insulin Detemir (LEVEMIR FLEXPEN) 100 UNIT/ML Pen Inject 5 Units into the skin daily at 10 pm.    . megestrol (MEGACE) 40 MG tablet Take 1 tablet (40 mg total) by mouth daily. 30 tablet 2  . Oxycodone HCl 10 MG TABS Take 1 tablet (10 mg total) by mouth every 6 (six) hours as needed. (Patient taking differently: Take 10 mg by mouth every 6 (six) hours as needed (pain). ) 60 tablet 0  . promethazine (PHENERGAN)  25 MG tablet Take 25 mg by mouth every 6 (six) hours as needed for nausea or vomiting.    . verapamil (CALAN) 40 MG tablet Take 1 tablet (40 mg total) by mouth 2 (two) times daily. 60 tablet 0  . benzonatate (TESSALON) 200 MG capsule Take 1 capsule (200 mg total) by mouth 3 (three) times daily as needed for cough. 30 capsule 1   No current facility-administered medications for this visit.     OBJECTIVE: Vitals:   02/10/16 0933    BP: 115/67  Pulse: 94  Resp: 18  Temp: 98.1 F (36.7 C)     Body mass index is 18.21 kg/m.    ECOG FS:2 - Symptomatic, <50% confined to bed  General: Ill-appearing, no acute distress. Eyes: anicteric sclera. Lungs: Clear to auscultation bilaterally. Heart: Regular rate and rhythm. No rubs, murmurs, or gallops. Abdomen: Soft, nontender, nondistended. No organomegaly noted, normoactive bowel sounds. Musculoskeletal: No edema, cyanosis, or clubbing. Neuro: Alert, answering all questions appropriately. Cranial nerves grossly intact. Skin: No rashes or petechiae noted. Psych: Normal affect.   LAB RESULTS:  Lab Results  Component Value Date   NA 133 (L) 02/10/2016   K 3.3 (L) 02/10/2016   CL 93 (L) 02/10/2016   CO2 28 02/10/2016   GLUCOSE 203 (H) 02/10/2016   BUN 31 (H) 02/10/2016   CREATININE 3.88 (H) 02/10/2016   CALCIUM 8.7 (L) 02/10/2016   PROT 6.6 02/10/2016   ALBUMIN 2.9 (L) 02/10/2016   AST 31 02/10/2016   ALT 19 02/10/2016   ALKPHOS 124 02/10/2016   BILITOT 1.1 02/10/2016   GFRNONAA 12 (L) 02/10/2016   GFRAA 14 (L) 02/10/2016    Lab Results  Component Value Date   WBC 7.3 02/10/2016   NEUTROABS 5.3 02/10/2016   HGB 7.3 (L) 02/10/2016   HCT 21.6 (L) 02/10/2016   MCV 92.4 02/10/2016   PLT 131 (L) 02/10/2016   Lab Results  Component Value Date   TOTALPROTELP 5.8 (L) 01/20/2016   ALBUMINELP 2.9 01/20/2016   A1GS 0.4 01/20/2016   A2GS 0.9 01/20/2016   BETS 1.0 01/20/2016   GAMS 0.6 01/20/2016   MSPIKE 0.2 (H) 01/20/2016   SPEI Comment 01/20/2016     STUDIES: Dg Chest Port 1 View  Result Date: 01/14/2016 CLINICAL DATA:  Respiratory distress EXAM: PORTABLE CHEST 1 VIEW COMPARISON:  12/04/2015 FINDINGS: Portable AP erect view of the chest. Right-sided central venous port tip overlies the cavoatrial junction. Left-sided central venous catheter tips overlie the right atrium with distal tip overlying the IVC right atrial junction. There are low lung volumes.  There are small bilateral effusions. Bibasilar atelectasis or infiltrates. Mild cardiomegaly with central congestion and mild interstitial edema. No pneumothorax. IMPRESSION: 1. Low lung volumes with small bilateral effusion and bibasilar atelectasis or infiltrates 2. Mild cardiomegaly with central vascular congestion and diffuse interstitial edema. Electronically Signed   By: Donavan Foil M.D.   On: 01/14/2016 17:31   US Abdomen Limited Ruq  Result Date: 01/14/2016 CLINICAL DATA:  Sepsis, right upper quadrant pain for the past week EXAM: US ABDOMEN LIMITED - RIGHT UPPER QUADRANT COMPARISON:  CT scan of the abdomen and pelvis of January 01, 2016 FINDINGS: Gallbladder: The gallbladder is adequately distended. There is wall thickening to 5 mm. There is a small amount of pericholecystic fluid. There is no positive sonographic Murphy's sign. No stones or sludge are observed. Common bile duct: Diameter: 2 mm Liver: The hepatic echotexture is mildly increased. There is no focal mass  nor ductal dilation. The surface contour of the liver is smooth. IMPRESSION: Mild gallbladder wall thickening with pericholecystic fluid with no evidence of stones. The findings may reflect acalculous cholecystitis. A nuclear medicine hepatobiliary scan with gallbladder ejection fraction determination may be useful. Increased hepatic echotexture likely reflects fatty infiltrative change. No focal hepatic mass. Incidental note is made of a right pleural effusion. Electronically Signed   By: David  Martinique M.D.   On: 01/14/2016 17:24    ASSESSMENT: Multiple Myeloma in relapse with multiple bony lytic lesions, end-stage renal disease.   PLAN:    1. Multiple Myeloma in relapse with multiple bony lytic lesions: MRI, CT, bone scan results reviewed independently indicating progression of disease. Patient also has progressed end-stage renal disease, likely secondary to underlying myeloma. Patient's performance status has significantly  declined, but she wishes to continue with aggressive treatment. Patient's kappa free light chain has trended down from greater than 25,000-11,319.  Proceed with cycle 2, day 8 of Kyporlis. Given patient's decreased performance status, will not increase dose of Kyporlis. Patient will receive treatment on days 1, 2, 8, 9, 15, and 16. Kyporlis does not need to be dose reduced in the setting of end-stage renal disease and dialysis. Can consider Delton See given her renal failure. Dexamethasone has been discontinued given her difficult to control blood sugar. Because of patient's immigration status, she could not undergo transplant. Return to clinic tomorrow for consideration of cycle 2, day 9 and then in 1 week for consideration of cycle 2, day 15.  2. Back pain:  Continue current narcotics as prescribed. 3. End-stage renal disease: Likely secondary to progression of disease. Continue dialysis on Monday, Wednesdays and Fridays. 4. Anemia: Hemoglobin continues to be decreased. Patient will receive one unit packed red blood cells today.  5. Hyperglycemia: Patient's blood glucose has improved since initiating insulin. Continue monitoring and treatment by primary care. Continue to hold dexamethasone as above. 6. Thrombocytopenia: Multifactorial secondary to treatment as well as disease progression. Improved. 7. Depression: Patient has agreed to reinitiate Celexa 20 mg daily.  The entire visit was done in the presence of an interpreter.  Patient expressed understanding and was in agreement with this plan. She also understands that She can call clinic at any time with any questions, concerns, or complaints.    Lloyd Huger, MD   02/12/2016 9:19 AM   ROS

## 2016-02-10 ENCOUNTER — Inpatient Hospital Stay: Payer: Self-pay | Attending: Oncology

## 2016-02-10 ENCOUNTER — Inpatient Hospital Stay: Payer: Self-pay

## 2016-02-10 ENCOUNTER — Inpatient Hospital Stay (HOSPITAL_BASED_OUTPATIENT_CLINIC_OR_DEPARTMENT_OTHER): Payer: Self-pay | Admitting: Oncology

## 2016-02-10 VITALS — BP 115/67 | HR 94 | Temp 98.1°F | Resp 18 | Wt 99.5 lb

## 2016-02-10 VITALS — BP 98/65 | HR 96 | Temp 98.0°F | Resp 20

## 2016-02-10 DIAGNOSIS — C9002 Multiple myeloma in relapse: Secondary | ICD-10-CM

## 2016-02-10 DIAGNOSIS — Z8619 Personal history of other infectious and parasitic diseases: Secondary | ICD-10-CM

## 2016-02-10 DIAGNOSIS — M549 Dorsalgia, unspecified: Secondary | ICD-10-CM

## 2016-02-10 DIAGNOSIS — E1165 Type 2 diabetes mellitus with hyperglycemia: Secondary | ICD-10-CM

## 2016-02-10 DIAGNOSIS — Z794 Long term (current) use of insulin: Secondary | ICD-10-CM

## 2016-02-10 DIAGNOSIS — J90 Pleural effusion, not elsewhere classified: Secondary | ICD-10-CM

## 2016-02-10 DIAGNOSIS — N186 End stage renal disease: Secondary | ICD-10-CM | POA: Insufficient documentation

## 2016-02-10 DIAGNOSIS — R634 Abnormal weight loss: Secondary | ICD-10-CM | POA: Insufficient documentation

## 2016-02-10 DIAGNOSIS — R531 Weakness: Secondary | ICD-10-CM | POA: Insufficient documentation

## 2016-02-10 DIAGNOSIS — F329 Major depressive disorder, single episode, unspecified: Secondary | ICD-10-CM

## 2016-02-10 DIAGNOSIS — R5383 Other fatigue: Secondary | ICD-10-CM | POA: Insufficient documentation

## 2016-02-10 DIAGNOSIS — M1990 Unspecified osteoarthritis, unspecified site: Secondary | ICD-10-CM | POA: Insufficient documentation

## 2016-02-10 DIAGNOSIS — Z992 Dependence on renal dialysis: Secondary | ICD-10-CM

## 2016-02-10 DIAGNOSIS — I129 Hypertensive chronic kidney disease with stage 1 through stage 4 chronic kidney disease, or unspecified chronic kidney disease: Secondary | ICD-10-CM | POA: Insufficient documentation

## 2016-02-10 DIAGNOSIS — Z79899 Other long term (current) drug therapy: Secondary | ICD-10-CM | POA: Insufficient documentation

## 2016-02-10 DIAGNOSIS — R630 Anorexia: Secondary | ICD-10-CM | POA: Insufficient documentation

## 2016-02-10 DIAGNOSIS — D696 Thrombocytopenia, unspecified: Secondary | ICD-10-CM | POA: Insufficient documentation

## 2016-02-10 DIAGNOSIS — F419 Anxiety disorder, unspecified: Secondary | ICD-10-CM

## 2016-02-10 DIAGNOSIS — Z5111 Encounter for antineoplastic chemotherapy: Secondary | ICD-10-CM | POA: Insufficient documentation

## 2016-02-10 DIAGNOSIS — D649 Anemia, unspecified: Secondary | ICD-10-CM

## 2016-02-10 DIAGNOSIS — Z8701 Personal history of pneumonia (recurrent): Secondary | ICD-10-CM | POA: Insufficient documentation

## 2016-02-10 DIAGNOSIS — K5900 Constipation, unspecified: Secondary | ICD-10-CM | POA: Insufficient documentation

## 2016-02-10 LAB — COMPREHENSIVE METABOLIC PANEL
ALBUMIN: 2.9 g/dL — AB (ref 3.5–5.0)
ALK PHOS: 124 U/L (ref 38–126)
ALT: 19 U/L (ref 14–54)
AST: 31 U/L (ref 15–41)
Anion gap: 12 (ref 5–15)
BILIRUBIN TOTAL: 1.1 mg/dL (ref 0.3–1.2)
BUN: 31 mg/dL — AB (ref 6–20)
CALCIUM: 8.7 mg/dL — AB (ref 8.9–10.3)
CO2: 28 mmol/L (ref 22–32)
CREATININE: 3.88 mg/dL — AB (ref 0.44–1.00)
Chloride: 93 mmol/L — ABNORMAL LOW (ref 101–111)
GFR calc Af Amer: 14 mL/min — ABNORMAL LOW (ref >60)
GFR calc non Af Amer: 12 mL/min — ABNORMAL LOW (ref >60)
GLUCOSE: 203 mg/dL — AB (ref 65–99)
POTASSIUM: 3.3 mmol/L — AB (ref 3.5–5.1)
Sodium: 133 mmol/L — ABNORMAL LOW (ref 135–145)
TOTAL PROTEIN: 6.6 g/dL (ref 6.5–8.1)

## 2016-02-10 LAB — CBC WITH DIFFERENTIAL/PLATELET
BASOS ABS: 0 10*3/uL (ref 0–0.1)
BASOS PCT: 1 %
Eosinophils Absolute: 0 10*3/uL (ref 0–0.7)
Eosinophils Relative: 1 %
HEMATOCRIT: 21.6 % — AB (ref 35.0–47.0)
HEMOGLOBIN: 7.3 g/dL — AB (ref 12.0–16.0)
Lymphocytes Relative: 10 %
Lymphs Abs: 0.7 10*3/uL — ABNORMAL LOW (ref 1.0–3.6)
MCH: 31.3 pg (ref 26.0–34.0)
MCHC: 33.9 g/dL (ref 32.0–36.0)
MCV: 92.4 fL (ref 80.0–100.0)
Monocytes Absolute: 1.2 10*3/uL — ABNORMAL HIGH (ref 0.2–0.9)
Monocytes Relative: 16 %
NEUTROS ABS: 5.3 10*3/uL (ref 1.4–6.5)
Neutrophils Relative %: 72 %
Platelets: 131 10*3/uL — ABNORMAL LOW (ref 150–440)
RBC: 2.34 MIL/uL — AB (ref 3.80–5.20)
RDW: 20.2 % — ABNORMAL HIGH (ref 11.5–14.5)
WBC: 7.3 10*3/uL (ref 3.6–11.0)

## 2016-02-10 LAB — PREPARE RBC (CROSSMATCH)

## 2016-02-10 LAB — SAMPLE TO BLOOD BANK

## 2016-02-10 MED ORDER — SODIUM CHLORIDE 0.9 % IJ SOLN
10.0000 mL | Freq: Once | INTRAMUSCULAR | Status: AC
  Administered 2016-02-10: 10 mL via INTRAVENOUS
  Filled 2016-02-10: qty 10

## 2016-02-10 MED ORDER — SODIUM CHLORIDE 0.9 % IV SOLN
Freq: Once | INTRAVENOUS | Status: AC
  Administered 2016-02-10: 10:00:00 via INTRAVENOUS
  Filled 2016-02-10: qty 1000

## 2016-02-10 MED ORDER — HEPARIN SOD (PORK) LOCK FLUSH 100 UNIT/ML IV SOLN
500.0000 [IU] | Freq: Once | INTRAVENOUS | Status: AC | PRN
  Administered 2016-02-10: 500 [IU]
  Filled 2016-02-10: qty 5

## 2016-02-10 MED ORDER — HEPARIN SOD (PORK) LOCK FLUSH 100 UNIT/ML IV SOLN: 500.0000 [IU] | Freq: Once | INTRAVENOUS | Status: AC

## 2016-02-10 MED ORDER — DEXTROSE 5 % IV SOLN
20.0000 mg/m2 | Freq: Once | INTRAVENOUS | Status: AC
  Administered 2016-02-10: 30 mg via INTRAVENOUS
  Filled 2016-02-10: qty 15

## 2016-02-10 MED ORDER — SODIUM CHLORIDE 0.9 % IV SOLN
Freq: Once | INTRAVENOUS | Status: AC
  Filled 2016-02-10: qty 1000

## 2016-02-10 MED ORDER — PROCHLORPERAZINE MALEATE 10 MG PO TABS
10.0000 mg | ORAL_TABLET | Freq: Once | ORAL | Status: AC
  Administered 2016-02-10: 10 mg via ORAL
  Filled 2016-02-10: qty 1

## 2016-02-10 MED ORDER — ACETAMINOPHEN 325 MG PO TABS
650.0000 mg | ORAL_TABLET | Freq: Once | ORAL | Status: AC
  Administered 2016-02-10: 650 mg via ORAL
  Filled 2016-02-10: qty 2

## 2016-02-10 MED ORDER — DIPHENHYDRAMINE HCL 50 MG/ML IJ SOLN
25.0000 mg | Freq: Once | INTRAMUSCULAR | Status: AC
  Administered 2016-02-10: 25 mg via INTRAVENOUS
  Filled 2016-02-10: qty 1

## 2016-02-10 MED ORDER — SODIUM CHLORIDE 0.9 % IV SOLN
250.0000 mL | Freq: Once | INTRAVENOUS | Status: AC
  Administered 2016-02-10: 250 mL via INTRAVENOUS
  Filled 2016-02-10: qty 250

## 2016-02-10 NOTE — Progress Notes (Signed)
Patient is here for follow up, she does have cold symptoms, she is concerned about losing more weight, she is asking about what products she can drink such as glucerna or instant carnation.

## 2016-02-11 ENCOUNTER — Inpatient Hospital Stay: Payer: Self-pay

## 2016-02-11 ENCOUNTER — Other Ambulatory Visit: Payer: Self-pay | Admitting: *Deleted

## 2016-02-11 VITALS — BP 119/71 | HR 101 | Temp 97.8°F | Resp 20

## 2016-02-11 DIAGNOSIS — C9002 Multiple myeloma in relapse: Secondary | ICD-10-CM

## 2016-02-11 LAB — TYPE AND SCREEN
ABO/RH(D): B POS
ANTIBODY SCREEN: NEGATIVE
Unit division: 0

## 2016-02-11 MED ORDER — BENZONATATE 200 MG PO CAPS: 200.0000 mg | ORAL_CAPSULE | Freq: Three times a day (TID) | ORAL | 1 refills | Status: DC | PRN

## 2016-02-11 MED ORDER — HEPARIN SOD (PORK) LOCK FLUSH 100 UNIT/ML IV SOLN
500.0000 [IU] | Freq: Once | INTRAVENOUS | Status: AC | PRN
  Administered 2016-02-11: 500 [IU]
  Filled 2016-02-11: qty 5

## 2016-02-11 MED ORDER — SODIUM CHLORIDE 0.9 % IV SOLN
Freq: Once | INTRAVENOUS | Status: AC
  Administered 2016-02-11: 10:00:00 via INTRAVENOUS
  Filled 2016-02-11: qty 1000

## 2016-02-11 MED ORDER — PROCHLORPERAZINE MALEATE 10 MG PO TABS
10.0000 mg | ORAL_TABLET | Freq: Once | ORAL | Status: AC
  Administered 2016-02-11: 10 mg via ORAL
  Filled 2016-02-11: qty 1

## 2016-02-11 MED ORDER — DEXTROSE 5 % IV SOLN
20.0000 mg/m2 | Freq: Once | INTRAVENOUS | Status: AC
  Administered 2016-02-11: 30 mg via INTRAVENOUS
  Filled 2016-02-11: qty 15

## 2016-02-12 ENCOUNTER — Encounter (INDEPENDENT_AMBULATORY_CARE_PROVIDER_SITE_OTHER): Payer: Self-pay

## 2016-02-12 ENCOUNTER — Ambulatory Visit (INDEPENDENT_AMBULATORY_CARE_PROVIDER_SITE_OTHER): Payer: Self-pay | Admitting: Vascular Surgery

## 2016-02-12 ENCOUNTER — Encounter (INDEPENDENT_AMBULATORY_CARE_PROVIDER_SITE_OTHER): Payer: Self-pay | Admitting: Vascular Surgery

## 2016-02-12 ENCOUNTER — Other Ambulatory Visit (INDEPENDENT_AMBULATORY_CARE_PROVIDER_SITE_OTHER): Payer: Self-pay | Admitting: Vascular Surgery

## 2016-02-12 VITALS — BP 136/54 | HR 98 | Resp 16 | Ht 61.0 in | Wt 100.4 lb

## 2016-02-12 DIAGNOSIS — N186 End stage renal disease: Secondary | ICD-10-CM

## 2016-02-12 DIAGNOSIS — F32A Depression, unspecified: Secondary | ICD-10-CM

## 2016-02-12 DIAGNOSIS — F329 Major depressive disorder, single episode, unspecified: Secondary | ICD-10-CM

## 2016-02-12 DIAGNOSIS — C9002 Multiple myeloma in relapse: Secondary | ICD-10-CM

## 2016-02-12 DIAGNOSIS — I150 Renovascular hypertension: Secondary | ICD-10-CM

## 2016-02-12 DIAGNOSIS — Z992 Dependence on renal dialysis: Secondary | ICD-10-CM

## 2016-02-12 LAB — PROTEIN ELECTROPHORESIS, SERUM
A/G RATIO SPE: 1.1 (ref 0.7–1.7)
ALPHA-1-GLOBULIN: 0.3 g/dL (ref 0.0–0.4)
ALPHA-2-GLOBULIN: 1 g/dL (ref 0.4–1.0)
Albumin ELP: 3 g/dL (ref 2.9–4.4)
BETA GLOBULIN: 1 g/dL (ref 0.7–1.3)
GLOBULIN, TOTAL: 2.8 g/dL (ref 2.2–3.9)
Gamma Globulin: 0.5 g/dL (ref 0.4–1.8)
M-Spike, %: 0.3 g/dL — ABNORMAL HIGH
Total Protein ELP: 5.8 g/dL — ABNORMAL LOW (ref 6.0–8.5)

## 2016-02-12 LAB — KAPPA/LAMBDA LIGHT CHAINS
Kappa free light chain: 9456.3 mg/L — ABNORMAL HIGH (ref 3.3–19.4)
Kappa, lambda light chain ratio: 727.41 — ABNORMAL HIGH (ref 0.26–1.65)
LAMDA FREE LIGHT CHAINS: 13 mg/L (ref 5.7–26.3)

## 2016-02-12 NOTE — Progress Notes (Signed)
MRN : [id number]  Jamie Keller is a 50 y.o. (January 26, 1966) female who presents with chief complaint of  Chief Complaint  Patient presents with  . New Patient (Initial Visit)  .  History of Present Illness:  The patient is seen for evaluation of dialysis access.  The patient has had her catheter for about 2 months. This is her first dialysis access. There have not been any past accesses in the arms and in the thighs.    Current access is via a catheter which is functioning well.  There have been no episodes of catheter infection.  The patient denies amaurosis fugax or recent TIA symptoms. There are no recent neurological changes noted. The patient denies claudication symptoms or rest pain symptoms. The patient denies history of DVT, PE or superficial thrombophlebitis. The patient denies recent episodes of angina or shortness of breath.    Current Meds  Medication Sig  . benzonatate (TESSALON) 200 MG capsule Take 1 capsule (200 mg total) by mouth 3 (three) times daily as needed for cough.  . citalopram (CELEXA) 20 MG tablet Take 1 tablet (20 mg total) by mouth daily.  Marland Kitchen dronabinol (MARINOL) 2.5 MG capsule Take 1 capsule (2.5 mg total) by mouth 2 (two) times daily before a meal.  . ferrous sulfate 325 (65 FE) MG tablet Take 325 mg by mouth daily with breakfast.  . furosemide (LASIX) 80 MG tablet Take 1 tablet (80 mg total) by mouth daily.  . Insulin Detemir (LEVEMIR FLEXPEN) 100 UNIT/ML Pen Inject 5 Units into the skin daily at 10 pm.  . megestrol (MEGACE) 40 MG tablet Take 1 tablet (40 mg total) by mouth daily.  . Oxycodone HCl 10 MG TABS Take 1 tablet (10 mg total) by mouth every 6 (six) hours as needed. (Patient taking differently: Take 10 mg by mouth every 6 (six) hours as needed (pain). )  . promethazine (PHENERGAN) 25 MG tablet Take 25 mg by mouth every 6 (six) hours as needed for nausea or vomiting.  . verapamil (CALAN) 40 MG tablet Take 1 tablet (40 mg total) by mouth 2  (two) times daily.    Past Medical History:  Diagnosis Date  . Diabetes mellitus without complication ([place])   . Hypertension   . Multiple myeloma ([place])   . Osteoarthritis   . Pneumonia   . Post-menopausal 2014  . Sepsis [facility])     Past Surgical History:  Procedure Laterality Date  . CESAREAN SECTION     x2  . dialysis catheter placement    . PERIPHERAL VASCULAR CATHETERIZATION N/A 11/19/2015   Procedure: Dialysis/Perma Catheter Insertion;  Surgeon: Algernon Huxley, MD;  Location: [facility];  Service: Cardiovascular;  Laterality: N/A;  . PORTACATH PLACEMENT      Social History Social History  Substance Use Topics  . Smoking status: Never Smoker  . Smokeless tobacco: Never Used  . Alcohol use No    Family History Family History  Problem Relation Age of Onset  . Hypercholesterolemia Mother   . Hypertension Mother   . Hypertension Father   No family history of bleeding/clotting disorders, porphyria or autoimmune disease   Allergies  Allergen Reactions  . No Known Allergies      REVIEW OF SYSTEMS (Negative unless checked)  Constitutional: [] Weight loss  [] Fever  [] Chills Cardiac: [] Chest pain   [] Chest pressure   [] Palpitations   [] Shortness of breath when laying flat   [] Shortness of breath with exertion. Vascular:  [] Pain in  legs with walking   [] Pain in legs at rest  [] History of DVT   [] Phlebitis   [] Swelling in legs   [] Varicose veins   [] Non-healing ulcers Pulmonary:   [] Uses home oxygen   [] Productive cough   [] Hemoptysis   [] Wheeze  [] COPD   [] Asthma Neurologic:  [] Dizziness   [] Seizures   [] History of stroke   [] History of TIA  [] Aphasia   [] Vissual changes   [] Weakness or numbness in arm   [] Weakness or numbness in leg Musculoskeletal:   [] Joint swelling   [] Joint pain   [x] Low back pain Hematologic:  [] Easy bruising  [] Easy bleeding   [] Hypercoagulable state   [] Anemic Gastrointestinal:  [] Diarrhea   [] Vomiting  [] Gastroesophageal reflux/heartburn    [] Difficulty swallowing. Genitourinary:  [x] Chronic kidney disease   [] Difficult urination  [] Frequent urination   [] Blood in urine Skin:  [] Rashes   [] Ulcers  Psychological:  [] History of anxiety   [x]  History of major depression.  Physical Examination  Vitals:   02/12/16 0910  BP: (!) 136/54  Pulse: 98  Resp: 16  Weight: 100 lb 6.4 oz (45.5 kg)  Height: 5' 1"  (1.549 m)   Body mass index is 18.97 kg/m. Gen: WD/WN, NAD Head: [place]/AT, No temporalis wasting.  Ear/Nose/Throat: Hearing grossly intact, nares w/o erythema or drainage, poor dentition Eyes: PER, EOMI, sclera nonicteric.  Neck: Supple, no masses.  No bruit or JVD.  Pulmonary:  Good air movement, clear to auscultation bilaterally, no use of accessory muscles.  Cardiac: RRR, normal S1, S2, no Murmurs. Vascular:  Infusiport in the right IJ tunneled cath in the left IJ nontender no drainage Vessel Right Left  Radial Palpable Palpable  Ulnar Palpable Palpable  Brachial Palpable Palpable  Carotid Palpable Palpable  Gastrointestinal: soft, non-distended. No guarding/no peritoneal signs.  Musculoskeletal: M/S 5/5 throughout.  No deformity or atrophy.  Neurologic: CN 2-12 intact. Pain and light touch intact in extremities.  Symmetrical.  Speech is fluent. Motor exam as listed above. Psychiatric: Judgment intact, Mood & affect appropriate for pt's clinical situation. Dermatologic: No rashes or ulcers noted.  No changes consistent with cellulitis. Lymph : No Cervical lymphadenopathy, no lichenification or skin changes of chronic lymphedema.  CBC Lab Results  Component Value Date   WBC 7.3 02/10/2016   HGB 7.3 (L) 02/10/2016   HCT 21.6 (L) 02/10/2016   MCV 92.4 02/10/2016   PLT 131 (L) 02/10/2016    BMET    Component Value Date/Time   NA 133 (L) 02/10/2016 0850   NA 137 06/27/2014 1344   K 3.3 (L) 02/10/2016 0850   K 4.0 06/27/2014 1344   CL 93 (L) 02/10/2016 0850   CL 106 06/27/2014 1344   CO2 28 02/10/2016 0850    CO2 26 06/27/2014 1344   GLUCOSE 203 (H) 02/10/2016 0850   GLUCOSE 83 06/27/2014 1344   BUN 31 (H) 02/10/2016 0850   BUN 27 (H) 06/27/2014 1344   CREATININE 3.88 (H) 02/10/2016 0850   CREATININE 1.36 (H) 06/27/2014 1344   CALCIUM 8.7 (L) 02/10/2016 0850   CALCIUM 9.1 06/27/2014 1344   GFRNONAA 12 (L) 02/10/2016 0850   GFRNONAA 46 (L) 06/27/2014 1344   GFRAA 14 (L) 02/10/2016 0850   GFRAA 53 (L) 06/27/2014 1344   Estimated Creatinine Clearance: 12.3 mL/min (by C-G formula based on SCr of 3.88 mg/dL (H)).  COAG Lab Results  Component Value Date   INR 1.51 01/14/2016    Radiology Dg Chest Port 1 View  Result Date: 01/14/2016 CLINICAL  DATA:  Respiratory distress EXAM: PORTABLE CHEST 1 VIEW COMPARISON:  12/04/2015 FINDINGS: Portable AP erect view of the chest. Right-sided central venous port tip overlies the cavoatrial junction. Left-sided central venous catheter tips overlie the right atrium with distal tip overlying the IVC right atrial junction. There are low lung volumes. There are small bilateral effusions. Bibasilar atelectasis or infiltrates. Mild cardiomegaly with central congestion and mild interstitial edema. No pneumothorax. IMPRESSION: 1. Low lung volumes with small bilateral effusion and bibasilar atelectasis or infiltrates 2. Mild cardiomegaly with central vascular congestion and diffuse interstitial edema. Electronically Signed   By: Donavan Foil M.D.   On: 01/14/2016 17:31   US Abdomen Limited Ruq  Result Date: 01/14/2016 CLINICAL DATA:  Sepsis, right upper quadrant pain for the past week EXAM: US ABDOMEN LIMITED - RIGHT UPPER QUADRANT COMPARISON:  CT scan of the abdomen and pelvis of January 01, 2016 FINDINGS: Gallbladder: The gallbladder is adequately distended. There is wall thickening to 5 mm. There is a small amount of pericholecystic fluid. There is no positive sonographic Murphy's sign. No stones or sludge are observed. Common bile duct: Diameter: 2 mm Liver: The  hepatic echotexture is mildly increased. There is no focal mass nor ductal dilation. The surface contour of the liver is smooth. IMPRESSION: Mild gallbladder wall thickening with pericholecystic fluid with no evidence of stones. The findings may reflect acalculous cholecystitis. A nuclear medicine hepatobiliary scan with gallbladder ejection fraction determination may be useful. Increased hepatic echotexture likely reflects fatty infiltrative change. No focal hepatic mass. Incidental note is made of a right pleural effusion. Electronically Signed   By: David  Martinique M.D.   On: 01/14/2016 17:24    Assessment/Plan 1. End stage renal failure on dialysis [facility]) Recommend:  At this time the patient does not have appropriate extremity access for dialysis  Patient should have a left arm brachial axillary graft created.  The risks, benefits and alternative therapies were reviewed in detail with the patient.  All questions were answered.  The patient agrees to proceed with surgery.    2. Renovascular hypertension Continue antihypertensive medications as already ordered and reviewed, no changes at this time.  Continue statin as ordered and reviewed, no changes at this time  3. Multiple myeloma in relapse [facility]) Continue chemo on Tuesdays and Wednesdays  4. Depression, unspecified depression type Continue Celexa    Hortencia Pilar, MD  02/12/2016 11:22 AM

## 2016-02-15 NOTE — Progress Notes (Signed)
[place]  [phone] [phone]  ID: [name] OB: [birth year]  MR#: [id number]  HKG#:[id number]  [name], MD as PCP - General (Family Medicine)  CHIEF COMPLAINT: Multiple Myeloma in relapse with multiple bony lytic lesions, now with end-stage renal disease on dialysis.   INTERVAL HISTORY: Patient returns to clinic today for further evaluation and consideration of cycle 2, day 15 of Kyporlis. She continues to feel weak and fatigued between treatments, but otherwise is tolerating her treatments well. Her appetite has approved with the addition of Marinol to her drug regime but it makes her "feel funny" on occasion. She has no neurologic complaints. She denies any further fevers. She does not complain of any nausea or vomiting. She denies any chest pain or shortness of breath.  She denies any constipation or diarrhea. She has no urinary complaints. Patient offers no further specific complaints today.  REVIEW OF SYSTEMS:   Review of Systems  Constitutional: Positive for malaise/fatigue and weight loss. Negative for fever.  Respiratory: Negative.  Negative for cough and shortness of breath.   Cardiovascular: Negative.  Negative for chest pain.  Gastrointestinal: Negative.  Negative for abdominal pain, nausea and vomiting.  Musculoskeletal: Positive for back pain.  Neurological: Positive for weakness. Negative for tingling and focal weakness.  Psychiatric/Behavioral: Positive for depression. The patient is nervous/anxious.    As per HPI. Otherwise, a complete review of systems is negative.   PAST MEDICAL HISTORY: Past Medical History:  Diagnosis Date  . Anemia   . Anxiety   . Chronic kidney disease    DIALYSIS  . Depression   . Diabetes mellitus without complication ([place])   . Dialysis patient ([place])    Mon, Wed, Fri  . History of chemotherapy    chemo on Tuesday and Wednesday  . Hypertension   .  Multiple myeloma ([place])   . Neuropathy associated with cancer ([place])   . Osteoarthritis   . Pancreatitis   . Pneumonia    September 2017, [facility]  . Post-menopausal 2014  . Sepsis ([place])     PAST SURGICAL HISTORY: Past Surgical History:  Procedure Laterality Date  . CESAREAN SECTION     x2  . dialysis catheter placement    . PERIPHERAL VASCULAR CATHETERIZATION N/A 11/19/2015   Procedure: Dialysis/Perma Catheter Insertion;  Surgeon: Algernon Huxley, MD;  Location: [facility];  Service: Cardiovascular;  Laterality: N/A;  . PORTACATH PLACEMENT    . TUBAL LIGATION      FAMILY HISTORY: Reviewed and unchanged. No reported history of malignancy or chronic disease.     ADVANCED DIRECTIVES:    HEALTH MAINTENANCE: Social History  Substance Use Topics  . Smoking status: Never Smoker  . Smokeless tobacco: Never Used  . Alcohol use No     Allergies  Allergen Reactions  . No Known Allergies     Current Outpatient Prescriptions  Medication Sig Dispense Refill  . acetaminophen (TYLENOL) 500 MG tablet Take 500 mg by mouth every 6 (six) hours as needed.    . benzonatate (TESSALON) 200 MG capsule Take 1 capsule (200 mg total) by mouth 3 (three) times daily as needed for cough. (Patient not taking: Reported on 02/20/2016) 30 capsule 1  . citalopram (CELEXA) 20 MG tablet Take 1 tablet (20 mg total) by mouth daily. 30 tablet 5  . dronabinol (MARINOL) 2.5 MG capsule Take 1 capsule (2.5 mg total) by mouth 2 (two) times daily before a meal. 60 capsule  0  . ferrous sulfate 325 (65 FE) MG tablet Take 325 mg by mouth daily with breakfast.    . furosemide (LASIX) 80 MG tablet Take 1 tablet (80 mg total) by mouth daily. 30 tablet 0  . Insulin Detemir (LEVEMIR FLEXPEN) 100 UNIT/ML Pen Inject 5 Units into the skin daily at 10 pm.    . Oxycodone HCl 10 MG TABS Take 1 tablet (10 mg total) by mouth every 6 (six) hours as needed. 60 tablet 0  . promethazine (PHENERGAN) 25 MG tablet Take 25 mg by mouth  every 6 (six) hours as needed for nausea or vomiting.    Marland Kitchen VERAPAMIL HCL ER PO Take 120 mg by mouth daily.    Marland Kitchen HYDROcodone-acetaminophen (NORCO) 5-325 MG tablet Take 1-2 tablets by mouth every 6 (six) hours as needed for moderate pain or severe pain. 50 tablet 0   No current facility-administered medications for this visit.    Facility-Administered Medications Ordered in Other Visits  Medication Dose Route Frequency Provider Last Rate Last Dose  . 0.9 %  sodium chloride infusion   Intravenous Continuous Gunnar Bulla, MD 50 mL/hr at 02/20/16 [phone]    . Chlorhexidine Gluconate Cloth 2 % PADS 6 each  6 each Topical Once [employer], PA-C       And  . Chlorhexidine Gluconate Cloth 2 % PADS 6 each  6 each Topical Once [employer], PA-C      . fentaNYL (SUBLIMAZE) 100 MCG/2ML injection           . fentaNYL (SUBLIMAZE) injection 25-50 mcg  25-50 mcg Intravenous Q5 min PRN Amy Penwarden, MD   25 mcg at 02/20/16 1015  . meperidine (DEMEROL) injection 6.25-12.5 mg  6.25-12.5 mg Intravenous Q5 min PRN Amy Penwarden, MD      . oxyCODONE (Oxy IR/ROXICODONE) immediate release tablet 5 mg  5 mg Oral Once PRN Amy Penwarden, MD       Or  . oxyCODONE (ROXICODONE) 5 MG/5ML solution 5 mg  5 mg Oral Once PRN Amy Penwarden, MD      . promethazine (PHENERGAN) injection 6.25-12.5 mg  6.25-12.5 mg Intravenous Q15 min PRN Amy Penwarden, MD        OBJECTIVE: Vitals:   02/17/16 1008  BP: 135/70  Pulse: 90  Resp: 18  Temp: 97.1 F (36.2 C)     Body mass index is 18.74 kg/m.    ECOG FS:2 - Symptomatic, <50% confined to bed  General: Ill-appearing, no acute distress. Eyes: anicteric sclera. Lungs: Clear to auscultation bilaterally. Heart: Regular rate and rhythm. No rubs, murmurs, or gallops. Abdomen: Soft, nontender, nondistended. No organomegaly noted, normoactive bowel sounds. Musculoskeletal: No edema, cyanosis, or clubbing. Neuro: Alert, answering all questions appropriately. Cranial  nerves grossly intact. Skin: No rashes or petechiae noted. Psych: Normal affect.   LAB RESULTS:  Lab Results  Component Value Date   NA 137 02/20/2016   K 4.0 02/20/2016   CL 95 (L) 02/17/2016   CO2 24 02/17/2016   GLUCOSE 131 (H) 02/20/2016   BUN 47 (H) 02/17/2016   CREATININE 3.79 (H) 02/17/2016   CALCIUM 9.2 02/17/2016   PROT 6.9 02/17/2016   ALBUMIN 3.0 (L) 02/17/2016   AST 38 02/17/2016   ALT 27 02/17/2016   ALKPHOS 252 (H) 02/17/2016   BILITOT 1.2 02/17/2016   GFRNONAA 13 (L) 02/17/2016   GFRAA 15 (L) 02/17/2016    Lab Results  Component Value Date   WBC 7.3 02/17/2016   NEUTROABS  5.2 02/17/2016   HGB 8.2 (L) 02/20/2016   HCT 24.0 (L) 02/20/2016   MCV 88.6 02/17/2016   PLT 118 (L) 02/17/2016   Lab Results  Component Value Date   TOTALPROTELP 6.3 02/17/2016   ALBUMINELP 3.2 02/17/2016   A1GS 0.4 02/17/2016   A2GS 1.1 (H) 02/17/2016   BETS 0.8 02/17/2016   GAMS 0.8 02/17/2016   MSPIKE 0.2 (H) 02/17/2016   SPEI Comment 02/17/2016     STUDIES: No results found.  ASSESSMENT: Multiple Myeloma in relapse with multiple bony lytic lesions, end-stage renal disease.   PLAN:    1. Multiple Myeloma in relapse with multiple bony lytic lesions: MRI, CT, bone scan results reviewed independently indicating progression of disease. Patient also has progressed end-stage renal disease, likely secondary to underlying myeloma. Patient's performance status has significantly declined, but she wishes to continue with aggressive treatment. Patient's kappa free light chain has trended down from previous visits and are pending from today. Proceed with cycle 2, day 15 of Kyporlis. Given patient's decreased performance status, will not increase dose of Kyporlis. Patient will receive treatment on days 1, 2, 8, 9, 15, and 16. Kyporlis does not need to be dose reduced in the setting of end-stage renal disease and dialysis. Can consider Delton See given her renal failure. Dexamethasone has been  discontinued given her difficult to control blood sugar. Because of patient's immigration status, she could not undergo transplant. Return to clinic in two weeks for cycle 3, Day 1.  2. Back pain: Pain is much better. Continue current narcotics as prescribed. 3. End-stage renal disease: Likely secondary to progression of disease. Continue dialysis on Monday, Wednesdays and Fridays. 4. Anemia: Hemoglobin stable today.Received one unit of PRBC last week. Monitor.  5. Hyperglycemia: Patient's blood glucose has improved since initiating insulin. Continue monitoring and treatment by primary care. Continue to hold dexamethasone as above. 6. Thrombocytopenia: Multifactorial secondary to treatment as well as disease progression. Improved. 7. Depression: Continue Celexa 20 mg daily. 8. Weight Loss: Continue Marinol for appetite. Suggested cutting dose in half if she continues to "feel funny".   The entire visit was done in the presence of an interpreter.  Patient expressed understanding and was in agreement with this plan. She also understands that She can call clinic at any time with any questions, concerns, or complaints.   Faythe Casa, NP 02/17/2016 03:30 PM  Patient was seen and evaluated independently and I agree with the assessment and plan as dictated above. Patient's kappa free light chains are essentially unchanged at 11,724.  Lloyd Huger, MD 02/20/16 1:25 PM    Review of Systems  Psychiatric/Behavioral: Negative for depression.

## 2016-02-16 ENCOUNTER — Inpatient Hospital Stay: Admission: RE | Admit: 2016-02-16 | Payer: Self-pay | Source: Ambulatory Visit

## 2016-02-17 ENCOUNTER — Inpatient Hospital Stay: Payer: Self-pay

## 2016-02-17 ENCOUNTER — Encounter
Admission: RE | Admit: 2016-02-17 | Discharge: 2016-02-17 | Disposition: A | Discharge status: Home / Self Care | Payer: Self-pay | Source: Ambulatory Visit | Attending: Vascular Surgery | Admitting: Vascular Surgery

## 2016-02-17 ENCOUNTER — Inpatient Hospital Stay (HOSPITAL_BASED_OUTPATIENT_CLINIC_OR_DEPARTMENT_OTHER): Payer: Self-pay | Admitting: Oncology

## 2016-02-17 VITALS — BP 135/70 | HR 90 | Temp 97.1°F | Resp 18 | Wt 99.2 lb

## 2016-02-17 DIAGNOSIS — C9002 Multiple myeloma in relapse: Secondary | ICD-10-CM

## 2016-02-17 DIAGNOSIS — R9431 Abnormal electrocardiogram [ECG] [EKG]: Secondary | ICD-10-CM | POA: Insufficient documentation

## 2016-02-17 DIAGNOSIS — R5383 Other fatigue: Secondary | ICD-10-CM

## 2016-02-17 DIAGNOSIS — I120 Hypertensive chronic kidney disease with stage 5 chronic kidney disease or end stage renal disease: Secondary | ICD-10-CM | POA: Insufficient documentation

## 2016-02-17 DIAGNOSIS — Z8619 Personal history of other infectious and parasitic diseases: Secondary | ICD-10-CM

## 2016-02-17 DIAGNOSIS — R630 Anorexia: Secondary | ICD-10-CM

## 2016-02-17 DIAGNOSIS — F419 Anxiety disorder, unspecified: Secondary | ICD-10-CM

## 2016-02-17 DIAGNOSIS — Z992 Dependence on renal dialysis: Secondary | ICD-10-CM

## 2016-02-17 DIAGNOSIS — J90 Pleural effusion, not elsewhere classified: Secondary | ICD-10-CM

## 2016-02-17 DIAGNOSIS — D649 Anemia, unspecified: Secondary | ICD-10-CM

## 2016-02-17 DIAGNOSIS — E1165 Type 2 diabetes mellitus with hyperglycemia: Secondary | ICD-10-CM

## 2016-02-17 DIAGNOSIS — D696 Thrombocytopenia, unspecified: Secondary | ICD-10-CM

## 2016-02-17 DIAGNOSIS — R634 Abnormal weight loss: Secondary | ICD-10-CM

## 2016-02-17 DIAGNOSIS — Z794 Long term (current) use of insulin: Secondary | ICD-10-CM

## 2016-02-17 DIAGNOSIS — N186 End stage renal disease: Secondary | ICD-10-CM | POA: Insufficient documentation

## 2016-02-17 DIAGNOSIS — Z79899 Other long term (current) drug therapy: Secondary | ICD-10-CM

## 2016-02-17 DIAGNOSIS — Z01812 Encounter for preprocedural laboratory examination: Secondary | ICD-10-CM | POA: Insufficient documentation

## 2016-02-17 DIAGNOSIS — R531 Weakness: Secondary | ICD-10-CM

## 2016-02-17 DIAGNOSIS — M1990 Unspecified osteoarthritis, unspecified site: Secondary | ICD-10-CM

## 2016-02-17 DIAGNOSIS — M549 Dorsalgia, unspecified: Secondary | ICD-10-CM

## 2016-02-17 DIAGNOSIS — F329 Major depressive disorder, single episode, unspecified: Secondary | ICD-10-CM

## 2016-02-17 DIAGNOSIS — Z8701 Personal history of pneumonia (recurrent): Secondary | ICD-10-CM

## 2016-02-17 DIAGNOSIS — I129 Hypertensive chronic kidney disease with stage 1 through stage 4 chronic kidney disease, or unspecified chronic kidney disease: Secondary | ICD-10-CM

## 2016-02-17 HISTORY — DX: Anxiety disorder, unspecified: F41.9

## 2016-02-17 HISTORY — DX: Chronic kidney disease, unspecified: N18.9

## 2016-02-17 HISTORY — DX: Dependence on renal dialysis: Z99.2

## 2016-02-17 HISTORY — DX: Major depressive disorder, single episode, unspecified: F32.9

## 2016-02-17 HISTORY — DX: Malignant (primary) neoplasm, unspecified: C80.1

## 2016-02-17 HISTORY — DX: Acute pancreatitis without necrosis or infection, unspecified: K85.90

## 2016-02-17 HISTORY — DX: Depression, unspecified: F32.A

## 2016-02-17 HISTORY — DX: Personal history of antineoplastic chemotherapy: Z92.21

## 2016-02-17 HISTORY — DX: Polyneuropathy in diseases classified elsewhere: G63

## 2016-02-17 HISTORY — DX: Anemia, unspecified: D64.9

## 2016-02-17 LAB — CBC WITH DIFFERENTIAL/PLATELET
BASOS PCT: 1 %
Basophils Absolute: 0 10*3/uL (ref 0–0.1)
Basophils Absolute: 0 10*3/uL (ref 0–0.1)
Basophils Relative: 1 %
Eosinophils Absolute: 0 10*3/uL (ref 0–0.7)
Eosinophils Absolute: 0 10*3/uL (ref 0–0.7)
Eosinophils Relative: 0 %
Eosinophils Relative: 0 %
HCT: 27 % — ABNORMAL LOW (ref 35.0–47.0)
HEMATOCRIT: 24.7 % — AB (ref 35.0–47.0)
Hemoglobin: 8.9 g/dL — ABNORMAL LOW (ref 12.0–16.0)
Hemoglobin: 8.3 g/dL — ABNORMAL LOW (ref 12.0–16.0)
LYMPHS ABS: 0.7 10*3/uL — AB (ref 1.0–3.6)
Lymphocytes Relative: 10 %
Lymphocytes Relative: 10 %
Lymphs Abs: 0.7 10*3/uL — ABNORMAL LOW (ref 1.0–3.6)
MCH: 29.9 pg (ref 26.0–34.0)
MCH: 29.8 pg (ref 26.0–34.0)
MCHC: 32.9 g/dL (ref 32.0–36.0)
MCHC: 33.6 g/dL (ref 32.0–36.0)
MCV: 91 fL (ref 80.0–100.0)
MCV: 88.6 fL (ref 80.0–100.0)
MONO ABS: 1.2 10*3/uL — AB (ref 0.2–0.9)
Monocytes Absolute: 1.3 10*3/uL — ABNORMAL HIGH (ref 0.2–0.9)
Monocytes Relative: 18 %
Monocytes Relative: 17 %
NEUTROS ABS: 5.2 10*3/uL (ref 1.4–6.5)
Neutro Abs: 5.1 10*3/uL (ref 1.4–6.5)
Neutrophils Relative %: 71 %
Neutrophils Relative %: 72 %
Platelets: 132 10*3/uL — ABNORMAL LOW (ref 150–440)
Platelets: 118 10*3/uL — ABNORMAL LOW (ref 150–440)
RBC: 2.96 MIL/uL — ABNORMAL LOW (ref 3.80–5.20)
RBC: 2.78 MIL/uL — ABNORMAL LOW (ref 3.80–5.20)
RDW: 21.7 % — ABNORMAL HIGH (ref 11.5–14.5)
RDW: 21.1 % — AB (ref 11.5–14.5)
WBC: 7.1 10*3/uL (ref 3.6–11.0)
WBC: 7.3 10*3/uL (ref 3.6–11.0)

## 2016-02-17 LAB — BASIC METABOLIC PANEL
ANION GAP: 13 (ref 5–15)
BUN: 47 mg/dL — ABNORMAL HIGH (ref 6–20)
CO2: 26 mmol/L (ref 22–32)
Calcium: 9.6 mg/dL (ref 8.9–10.3)
Chloride: 95 mmol/L — ABNORMAL LOW (ref 101–111)
Creatinine, Ser: 3.84 mg/dL — ABNORMAL HIGH (ref 0.44–1.00)
GFR calc Af Amer: 15 mL/min — ABNORMAL LOW (ref >60)
GFR, EST NON AFRICAN AMERICAN: 13 mL/min — AB (ref >60)
Glucose, Bld: 132 mg/dL — ABNORMAL HIGH (ref 65–99)
POTASSIUM: 4.8 mmol/L (ref 3.5–5.1)
SODIUM: 134 mmol/L — AB (ref 135–145)

## 2016-02-17 LAB — COMPREHENSIVE METABOLIC PANEL
ALBUMIN: 3 g/dL — AB (ref 3.5–5.0)
ALK PHOS: 252 U/L — AB (ref 38–126)
ALT: 27 U/L (ref 14–54)
ANION GAP: 13 (ref 5–15)
AST: 38 U/L (ref 15–41)
BILIRUBIN TOTAL: 1.2 mg/dL (ref 0.3–1.2)
BUN: 47 mg/dL — ABNORMAL HIGH (ref 6–20)
CO2: 24 mmol/L (ref 22–32)
Calcium: 9.2 mg/dL (ref 8.9–10.3)
Chloride: 95 mmol/L — ABNORMAL LOW (ref 101–111)
Creatinine, Ser: 3.79 mg/dL — ABNORMAL HIGH (ref 0.44–1.00)
GFR, EST AFRICAN AMERICAN: 15 mL/min — AB (ref >60)
GFR, EST NON AFRICAN AMERICAN: 13 mL/min — AB (ref >60)
Glucose, Bld: 142 mg/dL — ABNORMAL HIGH (ref 65–99)
POTASSIUM: 4.8 mmol/L (ref 3.5–5.1)
Sodium: 132 mmol/L — ABNORMAL LOW (ref 135–145)
TOTAL PROTEIN: 6.9 g/dL (ref 6.5–8.1)

## 2016-02-17 LAB — APTT: aPTT: 35 seconds (ref 24–36)

## 2016-02-17 LAB — TYPE AND SCREEN
ABO/RH(D): B POS
Antibody Screen: NEGATIVE
Extend sample reason: TRANSFUSED

## 2016-02-17 LAB — PROTIME-INR
INR: 1.27
Prothrombin Time: 16 seconds — ABNORMAL HIGH (ref 11.4–15.2)

## 2016-02-17 LAB — SURGICAL PCR SCREEN
MRSA, PCR: NEGATIVE
Staphylococcus aureus: NEGATIVE

## 2016-02-17 MED ORDER — OXYCODONE HCL 10 MG PO TABS: 10.0000 mg | ORAL_TABLET | Freq: Four times a day (QID) | ORAL | 0 refills | Status: DC | PRN

## 2016-02-17 MED ORDER — CITALOPRAM HYDROBROMIDE 20 MG PO TABS: 20.0000 mg | ORAL_TABLET | Freq: Every day | ORAL | 5 refills | Status: DC

## 2016-02-17 MED ORDER — SODIUM CHLORIDE 0.9 % IV SOLN
Freq: Once | INTRAVENOUS | Status: AC
Start: 2016-02-17 — End: 2016-02-17
  Administered 2016-02-17: 11:00:00 via INTRAVENOUS
  Filled 2016-02-17: qty 1000

## 2016-02-17 MED ORDER — HEPARIN SOD (PORK) LOCK FLUSH 100 UNIT/ML IV SOLN
500.0000 [IU] | Freq: Once | INTRAVENOUS | Status: AC | PRN
  Administered 2016-02-17: 500 [IU]
  Filled 2016-02-17: qty 5

## 2016-02-17 MED ORDER — SODIUM CHLORIDE 0.9% FLUSH
10.0000 mL | INTRAVENOUS | Status: DC | PRN
  Administered 2016-02-17: 10 mL
  Filled 2016-02-17: qty 10

## 2016-02-17 MED ORDER — CARFILZOMIB CHEMO INJECTION 60 MG
20.0000 mg/m2 | Freq: Once | INTRAVENOUS | Status: AC
  Administered 2016-02-17: 30 mg via INTRAVENOUS
  Filled 2016-02-17: qty 15

## 2016-02-17 MED ORDER — PROCHLORPERAZINE MALEATE 10 MG PO TABS
10.0000 mg | ORAL_TABLET | Freq: Once | ORAL | Status: AC
  Administered 2016-02-17: 10 mg via ORAL
  Filled 2016-02-17: qty 1

## 2016-02-17 MED ORDER — SODIUM CHLORIDE 0.9 % IV SOLN
Freq: Once | INTRAVENOUS | Status: AC
  Administered 2016-02-17: 11:00:00 via INTRAVENOUS
  Filled 2016-02-17: qty 1000

## 2016-02-17 NOTE — Progress Notes (Signed)
States that her family is telling her that she is talking without making sense and is pulling on dialysis catheter at night. Pt thinks her symptoms are related to taking marinol. It is helping her appetite slightly.

## 2016-02-17 NOTE — Patient Instructions (Signed)
Your procedure is scheduled on: February 20, 2016 (Friday) Su procedimiento est programado para: Report to Same Day Surgery.Medical Mall Check in at [place] a: To find out your arrival time please call [phone] between 1PM - 3PM on February 19, 2016 (Thursday). Para saber su hora de llegada por favor llame al [phone] entre la 1PM - 3PM el da:  Remember: Instructions that are not followed completely may result in serious medical risk, up to and including death, or upon the discretion of your surgeon and anesthesiologist your surgery may need to be rescheduled.  Recuerde: Las instrucciones que no se siguen completamente Heritage manager en un riesgo de salud grave, incluyendo hasta la [place] o a discrecin de su cirujano y Environmental health practitioner, su ciruga se puede posponer.   __x__ 1. Do not eat food or drink liquids after midnight. No gum chewing or hard candies.  No coma alimentos ni tome lquidos despus de la medianoche.  No mastique chicle ni caramelos  duros.     __x__ 2. No alcohol for 24 hours before or after surgery.    No tome alcohol durante las 24 horas antes ni despus de la [place].   ____ 3. Bring all medications with you on the day of surgery if instructed.    Lleve todos los medicamentos con usted el da de su ciruga si se le ha indicado as.   _x___ 4. Notify your doctor if there is any change in your medical condition (cold, fever,                             infections).    Informe a su mdico si hay algn cambio en su condicin mdica (resfriado, fiebre, infecciones).   Do not wear jewelry, make-up, hairpins, clips or nail polish.  No use joyas, maquillajes, pinzas/ganchos para el cabello ni esmalte de uas.  Do not wear lotions, powders, or perfumes. You may wear deodorant.  No use lociones, polvos o perfumes.  Puede usar desodorante.    Do not shave 48 hours prior to surgery. Men may shave face and neck.  No se afeite 48 horas antes  de la [place].  Los hombres pueden [employer] cara y el cuello.   Do not bring valuables to the hospital.   No lleve objetos [facility] is not responsible for any belongings or valuables.  [place] no se hace responsable de ningn tipo de pertenencias u objetos de Geographical information systems officer.               Contacts, dentures or bridgework may not be worn into surgery.  Los lentes de [place], las dentaduras postizas o puentes no se pueden usar en la [place].  Leave your suitcase in the car. After surgery it may be brought to your room.  Deje su maleta en el auto.  Despus de la ciruga podr traerla a su habitacin.  For patients admitted to the hospital, discharge time is determined by your treatment team.  Para los pacientes que sean ingresados al hospital, el tiempo en el cual se le dar de alta es determinado por su                equipo de [place].   Patients discharged the day of surgery will not be allowed to drive home. A los pacientes que se les da de alta el mismo da de la ciruga no se les Engineer, production  a casa.   Please read over the following fact sheets that you were given: Por favor Lynn informacin que le dieron:   MRSA Information   _x__ Take these medicines the morning of surgery with A SIP OF WATER:          [employer] medicinas la maana de la ciruga con UN SORBO DE AGUA:  1. Verapamil  2.   3.   4.       5.  6.  ____ Fleet Enema (as directed)          Enema de Fleet (segn lo indicado)    ____ Use CHG Soap as directed          Utilice el jabn de CHG segn lo indicado  ____ Use inhalers on the day of surgery          Use los inhaladores el da de la ciruga  ____ Stop metformin 2 days prior to surgery          Deje de tomar el metformin 2 das antes de la ciruga    _x___ Take 1/2 of usual insulin dose the night before surgery and none on the morning of surgery (Take one-half of Levemir insulin on December 14) No  Insulin the morning of surgery          Tome la mitad de la dosis habitual de insulina la noche antes de la [place] y no tome nada en la maana de la             ciruga  _x___ Stop Coumadin/Plavix/aspirin on (NO Aspirin)          Deje de tomar el Coumadin/Plavix/aspirina el da:  _x__ Stop Anti-inflammatories on (NO NSAIDS)          Deje de tomar Regulatory affairs officer:   ____ Stop supplements until after surgery            Deje de tomar suplementos hasta despus de la ciruga  ____ Bring C-Pap to the hospital          [facility]

## 2016-02-17 NOTE — Pre-Procedure Instructions (Signed)
EKG TO DR Gildardo Griffes AND COMPARED TO OLD EKG WITHOUT SIGNIFICANT CHANGES

## 2016-02-18 ENCOUNTER — Inpatient Hospital Stay: Payer: Self-pay

## 2016-02-18 VITALS — BP 110/68 | HR 95 | Temp 97.5°F | Resp 16

## 2016-02-18 DIAGNOSIS — C9002 Multiple myeloma in relapse: Secondary | ICD-10-CM

## 2016-02-18 LAB — KAPPA/LAMBDA LIGHT CHAINS
KAPPA FREE LGHT CHN: 11724.6 mg/L — AB (ref 3.3–19.4)
KAPPA, LAMDA LIGHT CHAIN RATIO: 613.85 — AB (ref 0.26–1.65)
LAMDA FREE LIGHT CHAINS: 19.1 mg/L (ref 5.7–26.3)

## 2016-02-18 MED ORDER — SODIUM CHLORIDE 0.9 % IV SOLN
Freq: Once | INTRAVENOUS | Status: DC
  Filled 2016-02-18: qty 1000

## 2016-02-18 MED ORDER — PROCHLORPERAZINE MALEATE 10 MG PO TABS
10.0000 mg | ORAL_TABLET | Freq: Once | ORAL | Status: AC
  Administered 2016-02-18: 10 mg via ORAL
  Filled 2016-02-18: qty 1

## 2016-02-18 MED ORDER — SODIUM CHLORIDE 0.9 % IV SOLN
Freq: Once | INTRAVENOUS | Status: AC
  Administered 2016-02-18: 09:00:00 via INTRAVENOUS
  Filled 2016-02-18: qty 1000

## 2016-02-18 MED ORDER — CARFILZOMIB CHEMO INJECTION 60 MG
20.0000 mg/m2 | Freq: Once | INTRAVENOUS | Status: AC
  Administered 2016-02-18: 30 mg via INTRAVENOUS
  Filled 2016-02-18: qty 15

## 2016-02-18 MED ORDER — HEPARIN SOD (PORK) LOCK FLUSH 100 UNIT/ML IV SOLN
500.0000 [IU] | Freq: Once | INTRAVENOUS | Status: AC | PRN
  Administered 2016-02-18: 500 [IU]
  Filled 2016-02-18: qty 5

## 2016-02-19 LAB — PROTEIN ELECTROPHORESIS, SERUM
A/G Ratio: 1 (ref 0.7–1.7)
ALPHA-2-GLOBULIN: 1.1 g/dL — AB (ref 0.4–1.0)
Albumin ELP: 3.2 g/dL (ref 2.9–4.4)
Alpha-1-Globulin: 0.4 g/dL (ref 0.0–0.4)
BETA GLOBULIN: 0.8 g/dL (ref 0.7–1.3)
GAMMA GLOBULIN: 0.8 g/dL (ref 0.4–1.8)
GLOBULIN, TOTAL: 3.1 g/dL (ref 2.2–3.9)
M-SPIKE, %: 0.2 g/dL — AB
Total Protein ELP: 6.3 g/dL (ref 6.0–8.5)

## 2016-02-20 ENCOUNTER — Ambulatory Visit: Payer: Self-pay | Admitting: Anesthesiology

## 2016-02-20 ENCOUNTER — Encounter: Payer: Self-pay | Admitting: *Deleted

## 2016-02-20 ENCOUNTER — Ambulatory Visit
Admission: RE | Admit: 2016-02-20 | Discharge: 2016-02-20 | Disposition: A | Discharge status: Home / Self Care | Payer: Self-pay | Source: Ambulatory Visit | Attending: Vascular Surgery | Admitting: Vascular Surgery

## 2016-02-20 ENCOUNTER — Encounter
Admission: RE | Disposition: A | Discharge status: Home / Self Care | Payer: Self-pay | Source: Ambulatory Visit | Attending: Vascular Surgery

## 2016-02-20 ENCOUNTER — Ambulatory Visit: Payer: Self-pay | Admitting: Certified Registered"

## 2016-02-20 DIAGNOSIS — I120 Hypertensive chronic kidney disease with stage 5 chronic kidney disease or end stage renal disease: Secondary | ICD-10-CM | POA: Insufficient documentation

## 2016-02-20 DIAGNOSIS — D631 Anemia in chronic kidney disease: Secondary | ICD-10-CM | POA: Insufficient documentation

## 2016-02-20 DIAGNOSIS — N186 End stage renal disease: Secondary | ICD-10-CM

## 2016-02-20 DIAGNOSIS — E1122 Type 2 diabetes mellitus with diabetic chronic kidney disease: Secondary | ICD-10-CM | POA: Insufficient documentation

## 2016-02-20 DIAGNOSIS — M1990 Unspecified osteoarthritis, unspecified site: Secondary | ICD-10-CM | POA: Insufficient documentation

## 2016-02-20 DIAGNOSIS — Z992 Dependence on renal dialysis: Secondary | ICD-10-CM | POA: Insufficient documentation

## 2016-02-20 DIAGNOSIS — Z794 Long term (current) use of insulin: Secondary | ICD-10-CM | POA: Insufficient documentation

## 2016-02-20 DIAGNOSIS — F419 Anxiety disorder, unspecified: Secondary | ICD-10-CM | POA: Insufficient documentation

## 2016-02-20 DIAGNOSIS — F329 Major depressive disorder, single episode, unspecified: Secondary | ICD-10-CM | POA: Insufficient documentation

## 2016-02-20 DIAGNOSIS — C9002 Multiple myeloma in relapse: Secondary | ICD-10-CM | POA: Insufficient documentation

## 2016-02-20 HISTORY — PX: AV FISTULA PLACEMENT: SHX1204

## 2016-02-20 LAB — POCT I-STAT 4, (NA,K, GLUC, HGB,HCT)
GLUCOSE: 131 mg/dL — AB (ref 65–99)
HEMATOCRIT: 24 % — AB (ref 36.0–46.0)
HEMOGLOBIN: 8.2 g/dL — AB (ref 12.0–15.0)
Potassium: 4 mmol/L (ref 3.5–5.1)
Sodium: 137 mmol/L (ref 135–145)

## 2016-02-20 LAB — GLUCOSE, CAPILLARY
Glucose-Capillary: 128 mg/dL — ABNORMAL HIGH (ref 65–99)
Glucose-Capillary: 137 mg/dL — ABNORMAL HIGH (ref 65–99)

## 2016-02-20 SURGERY — INSERTION OF ARTERIOVENOUS (AV) GORE-TEX GRAFT ARM
Anesthesia: General | Laterality: Left | Wound class: Clean

## 2016-02-20 MED ORDER — FENTANYL CITRATE (PF) 100 MCG/2ML IJ SOLN
25.0000 ug | INTRAMUSCULAR | Status: DC | PRN
  Administered 2016-02-20: 25 ug via INTRAVENOUS

## 2016-02-20 MED ORDER — FENTANYL CITRATE (PF) 100 MCG/2ML IJ SOLN
INTRAMUSCULAR | Status: DC
Start: 2016-02-20 — End: 2016-02-20
  Filled 2016-02-20: qty 2

## 2016-02-20 MED ORDER — EPHEDRINE SULFATE 50 MG/ML IJ SOLN
INTRAMUSCULAR | Status: DC | PRN
  Administered 2016-02-20: 10 mg via INTRAVENOUS

## 2016-02-20 MED ORDER — CEFAZOLIN SODIUM-DEXTROSE 2-4 GM/100ML-% IV SOLN
INTRAVENOUS | Status: AC
  Filled 2016-02-20: qty 100

## 2016-02-20 MED ORDER — FAMOTIDINE 20 MG PO TABS
20.0000 mg | ORAL_TABLET | Freq: Once | ORAL | Status: AC
Start: 2016-02-20 — End: 2016-02-20
  Administered 2016-02-20: 20 mg via ORAL

## 2016-02-20 MED ORDER — LIDOCAINE HCL (CARDIAC) 20 MG/ML IV SOLN
INTRAVENOUS | Status: DC | PRN
  Administered 2016-02-20: 40 mg via INTRAVENOUS

## 2016-02-20 MED ORDER — BUPIVACAINE HCL (PF) 0.5 % IJ SOLN
INTRAMUSCULAR | Status: DC | PRN
  Administered 2016-02-20: 14 mL

## 2016-02-20 MED ORDER — PHENYLEPHRINE HCL 10 MG/ML IJ SOLN
INTRAMUSCULAR | Status: DC | PRN
  Administered 2016-02-20: 50 ug via INTRAVENOUS
  Administered 2016-02-20: 100 ug via INTRAVENOUS
  Administered 2016-02-20: 50 ug via INTRAVENOUS
  Administered 2016-02-20 (×2): 100 ug via INTRAVENOUS

## 2016-02-20 MED ORDER — ONDANSETRON HCL 4 MG/2ML IJ SOLN
INTRAMUSCULAR | Status: DC | PRN
  Administered 2016-02-20: 4 mg via INTRAVENOUS

## 2016-02-20 MED ORDER — CEFAZOLIN SODIUM-DEXTROSE 2-4 GM/100ML-% IV SOLN
2.0000 g | INTRAVENOUS | Status: AC
  Administered 2016-02-20: 2 g via INTRAVENOUS

## 2016-02-20 MED ORDER — BUPIVACAINE HCL (PF) 0.5 % IJ SOLN
INTRAMUSCULAR | Status: AC
  Filled 2016-02-20: qty 30

## 2016-02-20 MED ORDER — HEPARIN SODIUM (PORCINE) 5000 UNIT/ML IJ SOLN
INTRAMUSCULAR | Status: AC
  Filled 2016-02-20: qty 1

## 2016-02-20 MED ORDER — MEPERIDINE HCL 25 MG/ML IJ SOLN: 6.2500 mg | INTRAMUSCULAR | Status: DC | PRN

## 2016-02-20 MED ORDER — SODIUM CHLORIDE 0.9 % IV SOLN
INTRAVENOUS | Status: DC
  Administered 2016-02-20: 07:00:00 via INTRAVENOUS

## 2016-02-20 MED ORDER — PROMETHAZINE HCL 25 MG/ML IJ SOLN: 6.2500 mg | INTRAMUSCULAR | Status: DC | PRN

## 2016-02-20 MED ORDER — SODIUM CHLORIDE 0.9 % IV SOLN
INTRAVENOUS | Status: DC | PRN
  Administered 2016-02-20: 09:00:00 100 mL via INTRAMUSCULAR

## 2016-02-20 MED ORDER — OXYCODONE HCL 5 MG/5ML PO SOLN: 5.0000 mg | Freq: Once | ORAL | Status: DC | PRN

## 2016-02-20 MED ORDER — FENTANYL CITRATE (PF) 100 MCG/2ML IJ SOLN
INTRAMUSCULAR | Status: DC | PRN
  Administered 2016-02-20: 50 ug via INTRAVENOUS
  Administered 2016-02-20 (×2): 25 ug via INTRAVENOUS

## 2016-02-20 MED ORDER — PROPOFOL 10 MG/ML IV BOLUS
INTRAVENOUS | Status: DC | PRN
  Administered 2016-02-20: 80 mg via INTRAVENOUS
  Administered 2016-02-20: 30 mg via INTRAVENOUS

## 2016-02-20 MED ORDER — CHLORHEXIDINE GLUCONATE CLOTH 2 % EX PADS: 6.0000 | MEDICATED_PAD | Freq: Once | CUTANEOUS | Status: DC

## 2016-02-20 MED ORDER — OXYCODONE HCL 5 MG PO TABS: 5.0000 mg | ORAL_TABLET | Freq: Once | ORAL | Status: DC | PRN

## 2016-02-20 MED ORDER — SODIUM CHLORIDE 0.9 % IV SOLN
INTRAVENOUS | Status: DC | PRN
  Administered 2016-02-20: 30 ug/min via INTRAVENOUS

## 2016-02-20 MED ORDER — FAMOTIDINE 20 MG PO TABS
ORAL_TABLET | ORAL | Status: AC
  Administered 2016-02-20: 20 mg via ORAL
  Filled 2016-02-20: qty 1

## 2016-02-20 MED ORDER — HYDROCODONE-ACETAMINOPHEN 5-325 MG PO TABS: 1.0000 | ORAL_TABLET | Freq: Four times a day (QID) | ORAL | 0 refills | Status: DC | PRN

## 2016-02-20 SURGICAL SUPPLY — 64 items
APPLIER CLIP 11 MED OPEN (CLIP)
APPLIER CLIP 9.375 SM OPEN (CLIP) ×3
BAG COUNTER SPONGE EZ (MISCELLANEOUS) IMPLANT
BAG DECANTER FOR FLEXI CONT (MISCELLANEOUS) ×3 IMPLANT
BLADE SURG SZ11 CARB STEEL (BLADE) ×3 IMPLANT
BOOT SUTURE AID YELLOW STND (SUTURE) ×3 IMPLANT
BRUSH SCRUB 4% CHG (MISCELLANEOUS) ×3 IMPLANT
CANISTER SUCT 1200ML W/VALVE (MISCELLANEOUS) ×3 IMPLANT
CATH ROBINSON RED A/P 10FR (CATHETERS) ×3 IMPLANT
CHLORAPREP W/TINT 26ML (MISCELLANEOUS) ×3 IMPLANT
CLIP APPLIE 11 MED OPEN (CLIP) IMPLANT
CLIP APPLIE 9.375 SM OPEN (CLIP) ×1 IMPLANT
COUNTER SPONGE BAG EZ (MISCELLANEOUS)
DERMABOND ADVANCED (GAUZE/BANDAGES/DRESSINGS) ×2
DERMABOND ADVANCED .7 DNX12 (GAUZE/BANDAGES/DRESSINGS) ×1 IMPLANT
DRESSING SURGICEL FIBRLLR 1X2 (HEMOSTASIS) ×1 IMPLANT
DRSG SURGICEL FIBRILLAR 1X2 (HEMOSTASIS) ×3
DRSG TEGADERM 2-3/8X2-3/4 SM (GAUZE/BANDAGES/DRESSINGS) ×3 IMPLANT
ELECT CAUTERY BLADE 6.4 (BLADE) ×3 IMPLANT
ELECT REM PT RETURN 9FT ADLT (ELECTROSURGICAL) ×3
ELECTRODE REM PT RTRN 9FT ADLT (ELECTROSURGICAL) ×1 IMPLANT
GAUZE SPONGE NON-WVN 2X2 STRL (MISCELLANEOUS) ×1 IMPLANT
GLOVE BIO SURGEON STRL SZ7 (GLOVE) ×9 IMPLANT
GLOVE INDICATOR 7.5 STRL GRN (GLOVE) ×6 IMPLANT
GLOVE SURG SYN 8.0 (GLOVE) ×3 IMPLANT
GOWN STRL REUS W/ TWL LRG LVL3 (GOWN DISPOSABLE) ×1 IMPLANT
GOWN STRL REUS W/ TWL XL LVL3 (GOWN DISPOSABLE) ×2 IMPLANT
GOWN STRL REUS W/TWL LRG LVL3 (GOWN DISPOSABLE) ×2
GOWN STRL REUS W/TWL XL LVL3 (GOWN DISPOSABLE) ×4
GRAFT PROPATEN STD WALL 4 7X45 (Vascular Products) ×3 IMPLANT
IV NS 500ML (IV SOLUTION) ×2
IV NS 500ML BAXH (IV SOLUTION) ×1 IMPLANT
KIT RM TURNOVER STRD PROC AR (KITS) ×3 IMPLANT
LABEL OR SOLS (LABEL) ×3 IMPLANT
LOOP RED MAXI  1X406MM (MISCELLANEOUS) ×2
LOOP VESSEL MAXI 1X406 RED (MISCELLANEOUS) ×1 IMPLANT
LOOP VESSEL MINI 0.8X406 BLUE (MISCELLANEOUS) ×2 IMPLANT
LOOPS BLUE MINI 0.8X406MM (MISCELLANEOUS) ×4
NEEDLE FILTER BLUNT 18X 1/2SAF (NEEDLE) ×2
NEEDLE FILTER BLUNT 18X1 1/2 (NEEDLE) ×1 IMPLANT
NS IRRIG 500ML POUR BTL (IV SOLUTION) IMPLANT
PACK EXTREMITY ARMC (MISCELLANEOUS) ×3 IMPLANT
PAD PREP 24X41 OB/GYN DISP (PERSONAL CARE ITEMS) ×3 IMPLANT
PUNCH SURGICAL ROTATE 2.7MM (MISCELLANEOUS) IMPLANT
SPONGE VERSALON 2X2 STRL (MISCELLANEOUS) ×2
STOCKINETTE STRL 4IN 9604848 (GAUZE/BANDAGES/DRESSINGS) ×3 IMPLANT
SUT ETHILON 3-0 FS-10 30 BLK (SUTURE) ×3
SUT GTX CV-6 30 (SUTURE) ×6 IMPLANT
SUT MNCRL+ 5-0 UNDYED PC-3 (SUTURE) ×1 IMPLANT
SUT MONOCRYL 5-0 (SUTURE) ×2
SUT PROLENE 6 0 BV (SUTURE) ×9 IMPLANT
SUT PROLENE 7 0 BV 1 (SUTURE) ×6 IMPLANT
SUT SILK 2 0 (SUTURE) ×2
SUT SILK 2 0 SH (SUTURE) ×3 IMPLANT
SUT SILK 2-0 18XBRD TIE 12 (SUTURE) ×1 IMPLANT
SUT SILK 3 0 (SUTURE) ×2
SUT SILK 3-0 18XBRD TIE 12 (SUTURE) ×1 IMPLANT
SUT SILK 4 0 (SUTURE) ×2
SUT SILK 4-0 18XBRD TIE 12 (SUTURE) ×1 IMPLANT
SUT VIC AB 3-0 SH 27 (SUTURE) ×2
SUT VIC AB 3-0 SH 27X BRD (SUTURE) ×1 IMPLANT
SUTURE EHLN 3-0 FS-10 30 BLK (SUTURE) ×1 IMPLANT
SYR 20CC LL (SYRINGE) ×3 IMPLANT
SYR 3ML LL SCALE MARK (SYRINGE) ×3 IMPLANT

## 2016-02-20 NOTE — Anesthesia Preprocedure Evaluation (Signed)
Anesthesia Evaluation  Patient identified by MRN, date of birth, ID band Patient awake    Reviewed: Allergy & Precautions, NPO status , Patient's Chart, lab work & pertinent test results  History of Anesthesia Complications Negative for: history of anesthetic complications  Airway Mallampati: II  TM Distance: >3 FB Neck ROM: Full    Dental  (+) Implants   Pulmonary neg sleep apnea, Pneumonia: hospitalized for pneumonia in September, now recovered., neg COPD,    breath sounds clear to auscultation- rhonchi (-) wheezing      Cardiovascular hypertension, (-) CAD and (-) Past MI  Rhythm:Regular Rate:Normal - Systolic murmurs and - Diastolic murmurs    Neuro/Psych Anxiety Depression negative neurological ROS     GI/Hepatic negative GI ROS, Neg liver ROS,   Endo/Other  diabetes, Insulin Dependent  Renal/GU ESRF and DialysisRenal disease (last dialysis yesterday)     Musculoskeletal  (+) Arthritis ,   Abdominal (+) - obese,   Peds  Hematology  (+) anemia ,   Anesthesia Other Findings Past Medical History: No date: Anemia No date: Anxiety No date: Chronic kidney disease     Comment: DIALYSIS No date: Depression No date: Diabetes mellitus without complication (H[facility]) No date: Dialysis patient [facility])     Comment: Mon, Wed, Fri No date: History of chemotherapy     Comment: chemo on Tuesday and Wednesday No date: Hypertension No date: Multiple myeloma ([place]) No date: Neuropathy associated with cancer ([place]) No date: Osteoarthritis No date: Pancreatitis No date: Pneumonia     Comment: September 2017, [facility] 2014: Post-menopausal No date: Sepsis [facility])   Reproductive/Obstetrics                            Anesthesia Physical Anesthesia Plan  ASA: IV  Anesthesia Plan: General   Post-op Pain Management:    Induction: Intravenous  Airway Management Planned: LMA  Additional Equipment:    Intra-op Plan:   Post-operative Plan:   Informed Consent: I have reviewed the patients History and Physical, chart, labs and discussed the procedure including the risks, benefits and alternatives for the proposed anesthesia with the patient or authorized representative who has indicated his/her understanding and acceptance.   Dental advisory given  Plan Discussed with: CRNA and Anesthesiologist  Anesthesia Plan Comments:         Anesthesia Quick Evaluation

## 2016-02-20 NOTE — Transfer of Care (Signed)
Immediate Anesthesia Transfer of Care Note  Patient: Jamie Keller  Procedure(s) Performed: Procedure(s): INSERTION OF ARTERIOVENOUS (AV) GORE-TEX GRAFT ARM (Left)  Patient Location: PACU  Anesthesia Type:General  Level of Consciousness: sedated and responds to stimulation  Airway & Oxygen Therapy: Patient Spontanous Breathing and Patient connected to face mask oxygen  Post-op Assessment: Report given to RN and Post -op Vital signs reviewed and stable  Post vital signs: Reviewed and stable  Last Vitals:  Vitals:   02/20/16 0618 02/20/16 0948  BP: (!) 119/50 (!) 93/53  Pulse: 84 85  Resp: 16 20  Temp: 36.7 C 36.8 C    Last Pain:  Vitals:   02/20/16 0948  TempSrc:   PainSc: (P) Asleep         Complications: No apparent anesthesia complications

## 2016-02-20 NOTE — H&P (Signed)
[facility] History & Physical Update  The patient was interviewed and re-examined.  The patient's previous History and Physical has been reviewed and is unchanged.  There is no change in the plan of care. We plan to proceed with the scheduled procedure.  Hortencia Pilar, MD  02/20/2016, 7:26 AM

## 2016-02-20 NOTE — OR Nursing (Signed)
Dicussed discharge instructions with patient and daughter with interpreter.

## 2016-02-20 NOTE — Discharge Instructions (Signed)

## 2016-02-20 NOTE — Anesthesia Procedure Notes (Signed)
Procedure Name: LMA Insertion Performed by: Ajah Vanhoose Pre-anesthesia Checklist: Patient identified, Patient being monitored, Timeout performed, Emergency Drugs available and Suction available Patient Re-evaluated:Patient Re-evaluated prior to inductionOxygen Delivery Method: Circle system utilized Preoxygenation: Pre-oxygenation with 100% oxygen Intubation Type: IV induction Ventilation: Mask ventilation without difficulty LMA: LMA inserted LMA Size: 3.0 Tube type: Oral Number of attempts: 1 Placement Confirmation: positive ETCO2 and breath sounds checked- equal and bilateral Tube secured with: Tape Dental Injury: Teeth and Oropharynx as per pre-operative assessment        

## 2016-02-20 NOTE — Op Note (Signed)
OPERATIVE NOTE   PROCEDURE: left brachial axillary arteriovenous graft placement  PRE-OPERATIVE DIAGNOSIS: End Stage Renal Disease  POST-OPERATIVE DIAGNOSIS: End Stage Renal Disease  SURGEON: Hortencia Pilar  ASSISTANT(S): Ms. Hezzie Bump  ANESTHESIA: general  ESTIMATED BLOOD LOSS: <50 cc  FINDING(S): Very small vasculature; 3 mm brachial artery and 5 mm axillary vein  SPECIMEN(S):  none  INDICATIONS:   Jamie Keller is a 50 y.o. female who presents with end stage renal disease.  The patient is scheduled for left brachial axillary AV graft placement.  The patient is aware the risks include but are not limited to: bleeding, infection, steal syndrome, nerve damage, ischemic monomelic neuropathy, failure to mature, and need for additional procedures.  The patient is aware of the risks of the procedure and elects to proceed forward.  DESCRIPTION: After full informed written consent was obtained from the patient, the patient was brought back to the operating room and placed supine upon the operating table.  Prior to induction, the patient received IV antibiotics.   After obtaining adequate anesthesia, the patient was then prepped and draped in the standard fashion for a left arm access procedure.    A linear incision was then created along the medial border of the biceps muscle just proximal to the antecubital crease and the brachial artery which was exposed through. The brachial artery was then looped proximally and distally with Silastic Vesseloops. Side branches were controlled with 4-0 silk ties.  Attention was then turned to the exposure of the axillary vein. Linear incision was then created medial to the proximal portion of the biceps at the level of the anterior axillary crease. The axillary vein was exposed and again looped proximally and distally with Silastic vessel loops. Associated tributaries were also controlled with Silastic Vesseloops.  The Gore tunneler  was then delivered onto the field and a subcutaneous path was made from the arterial incision to the venous incision. A 4-7 tapered PTFE propatent graft by Simeon Craft was then pulled through the subcutaneous tunnel. The arterial 4 mm portion was then approximated to the brachial artery. Brachial artery was controlled proximally and distally with the Silastic Vesseloops. Arteriotomy was made with an 11 blade scalpel and extended with Potts scissors and a 6-0 Prolene stay suture was placed. End graft to side brachial artery anastomosis was then fashioned with running CV 6 suture. Flushing maneuvers were performed suture line was hemostatic and the graft was then assessed for proper position and ease of future cannulation. Heparinized saline was infused into the vein and the graft was clamped with a vascular clamp. With the graft pressurized it was approximated to the axillary vein in its native bed and then marked with a surgical marker. The vein was then delivered into the surgical field and controlled with the Silastic vessel loops. Venotomy was then made with an 11 blade scalpel and extended with Potts scissors and a 6-0 Prolene suture was used as stay suture. The the graft was then sewn to the vein in an end graft to side vein fashion using running CV 6 suture.  Flushing maneuvers were performed and the artery was allowed to forward and back bleed.  Flow was then established through the AV graft  There was good  thrill in the venous outflow, and there was 1+ palpable radial pulse.  At this point, I irrigated out the surgical wounds.  There was no further active bleeding.  The subcutaneous tissue was reapproximated with a running stitch of 3-0 Vicryl.  The skin was then reapproximated with a running subcuticular stitch of 4-0 Vicryl.  The skin was then cleaned, dried, and reinforced with Dermabond.    The patient tolerated this procedure well.   COMPLICATIONS: None  CONDITION: Jamie Keller  [facility]  Office: [phone]   02/20/2016, 9:26 AM

## 2016-02-20 NOTE — Anesthesia Postprocedure Evaluation (Signed)
Anesthesia Post Note  Patient: Location manager  Procedure(s) Performed: Procedure(s) (LRB): INSERTION OF ARTERIOVENOUS (AV) GORE-TEX GRAFT ARM (Left)  Patient location during evaluation: PACU Anesthesia Type: General Level of consciousness: awake and alert and oriented Pain management: pain level controlled Vital Signs Assessment: post-procedure vital signs reviewed and stable Respiratory status: spontaneous breathing, nonlabored ventilation and respiratory function stable Cardiovascular status: blood pressure returned to baseline and stable Postop Assessment: no signs of nausea or vomiting Anesthetic complications: no    Last Vitals:  Vitals:   02/20/16 1020 02/20/16 1030  BP: (!) 95/42 (!) 100/40  Pulse: 88 89  Resp: 20 20  Temp:      Last Pain:  Vitals:   02/20/16 1030  TempSrc:   PainSc: 0-No pain                 Nicolus Ose

## 2016-02-23 ENCOUNTER — Encounter: Payer: Self-pay | Admitting: Vascular Surgery

## 2016-02-25 ENCOUNTER — Encounter: Payer: Self-pay | Admitting: Vascular Surgery

## 2016-03-02 ENCOUNTER — Ambulatory Visit: Payer: Self-pay

## 2016-03-02 ENCOUNTER — Inpatient Hospital Stay: Payer: Self-pay

## 2016-03-02 ENCOUNTER — Inpatient Hospital Stay (HOSPITAL_BASED_OUTPATIENT_CLINIC_OR_DEPARTMENT_OTHER): Payer: Self-pay | Admitting: Oncology

## 2016-03-02 VITALS — BP 119/64 | HR 93 | Temp 97.2°F | Resp 18 | Wt 102.8 lb

## 2016-03-02 DIAGNOSIS — I129 Hypertensive chronic kidney disease with stage 1 through stage 4 chronic kidney disease, or unspecified chronic kidney disease: Secondary | ICD-10-CM

## 2016-03-02 DIAGNOSIS — E1165 Type 2 diabetes mellitus with hyperglycemia: Secondary | ICD-10-CM

## 2016-03-02 DIAGNOSIS — D696 Thrombocytopenia, unspecified: Secondary | ICD-10-CM

## 2016-03-02 DIAGNOSIS — N186 End stage renal disease: Secondary | ICD-10-CM

## 2016-03-02 DIAGNOSIS — Z992 Dependence on renal dialysis: Secondary | ICD-10-CM

## 2016-03-02 DIAGNOSIS — Z8619 Personal history of other infectious and parasitic diseases: Secondary | ICD-10-CM

## 2016-03-02 DIAGNOSIS — M549 Dorsalgia, unspecified: Secondary | ICD-10-CM

## 2016-03-02 DIAGNOSIS — R634 Abnormal weight loss: Secondary | ICD-10-CM

## 2016-03-02 DIAGNOSIS — R5383 Other fatigue: Secondary | ICD-10-CM

## 2016-03-02 DIAGNOSIS — D649 Anemia, unspecified: Secondary | ICD-10-CM

## 2016-03-02 DIAGNOSIS — Z8701 Personal history of pneumonia (recurrent): Secondary | ICD-10-CM

## 2016-03-02 DIAGNOSIS — R630 Anorexia: Secondary | ICD-10-CM

## 2016-03-02 DIAGNOSIS — C9002 Multiple myeloma in relapse: Secondary | ICD-10-CM

## 2016-03-02 DIAGNOSIS — Z794 Long term (current) use of insulin: Secondary | ICD-10-CM

## 2016-03-02 DIAGNOSIS — F329 Major depressive disorder, single episode, unspecified: Secondary | ICD-10-CM

## 2016-03-02 DIAGNOSIS — M1990 Unspecified osteoarthritis, unspecified site: Secondary | ICD-10-CM

## 2016-03-02 DIAGNOSIS — Z79899 Other long term (current) drug therapy: Secondary | ICD-10-CM

## 2016-03-02 DIAGNOSIS — K5900 Constipation, unspecified: Secondary | ICD-10-CM

## 2016-03-02 DIAGNOSIS — R531 Weakness: Secondary | ICD-10-CM

## 2016-03-02 DIAGNOSIS — J90 Pleural effusion, not elsewhere classified: Secondary | ICD-10-CM

## 2016-03-02 DIAGNOSIS — F419 Anxiety disorder, unspecified: Secondary | ICD-10-CM

## 2016-03-02 LAB — COMPREHENSIVE METABOLIC PANEL
ALBUMIN: 2.7 g/dL — AB (ref 3.5–5.0)
ALT: 7 U/L — ABNORMAL LOW (ref 14–54)
ANION GAP: 15 (ref 5–15)
AST: 28 U/L (ref 15–41)
Alkaline Phosphatase: 167 U/L — ABNORMAL HIGH (ref 38–126)
BILIRUBIN TOTAL: 1.1 mg/dL (ref 0.3–1.2)
BUN: 64 mg/dL — ABNORMAL HIGH (ref 6–20)
CHLORIDE: 92 mmol/L — AB (ref 101–111)
CO2: 22 mmol/L (ref 22–32)
Calcium: 9.7 mg/dL (ref 8.9–10.3)
Creatinine, Ser: 5.51 mg/dL — ABNORMAL HIGH (ref 0.44–1.00)
GFR calc Af Amer: 9 mL/min — ABNORMAL LOW (ref >60)
GFR calc non Af Amer: 8 mL/min — ABNORMAL LOW (ref >60)
Glucose, Bld: 167 mg/dL — ABNORMAL HIGH (ref 65–99)
POTASSIUM: 5.1 mmol/L (ref 3.5–5.1)
SODIUM: 129 mmol/L — AB (ref 135–145)
TOTAL PROTEIN: 6.5 g/dL (ref 6.5–8.1)

## 2016-03-02 LAB — CBC WITH DIFFERENTIAL/PLATELET
BASOS PCT: 1 %
Basophils Absolute: 0.1 10*3/uL (ref 0–0.1)
EOS ABS: 0 10*3/uL (ref 0–0.7)
EOS PCT: 1 %
HCT: 21.7 % — ABNORMAL LOW (ref 35.0–47.0)
Hemoglobin: 7.3 g/dL — ABNORMAL LOW (ref 12.0–16.0)
Lymphocytes Relative: 12 %
Lymphs Abs: 0.8 10*3/uL — ABNORMAL LOW (ref 1.0–3.6)
MCH: 31.2 pg (ref 26.0–34.0)
MCHC: 33.5 g/dL (ref 32.0–36.0)
MCV: 93 fL (ref 80.0–100.0)
MONO ABS: 1.3 10*3/uL — AB (ref 0.2–0.9)
MONOS PCT: 19 %
Neutro Abs: 4.6 10*3/uL (ref 1.4–6.5)
Neutrophils Relative %: 67 %
PLATELETS: 198 10*3/uL (ref 150–440)
RBC: 2.33 MIL/uL — ABNORMAL LOW (ref 3.80–5.20)
RDW: 23.7 % — AB (ref 11.5–14.5)
WBC: 6.8 10*3/uL (ref 3.6–11.0)

## 2016-03-02 MED ORDER — PROCHLORPERAZINE MALEATE 10 MG PO TABS
10.0000 mg | ORAL_TABLET | Freq: Once | ORAL | Status: AC
  Administered 2016-03-02: 10 mg via ORAL
  Filled 2016-03-02: qty 1

## 2016-03-02 MED ORDER — SODIUM CHLORIDE 0.9 % IV SOLN
Freq: Once | INTRAVENOUS | Status: AC
Start: 2016-03-02 — End: 2016-03-02
  Administered 2016-03-02: 12:00:00 via INTRAVENOUS
  Filled 2016-03-02: qty 1000

## 2016-03-02 MED ORDER — HEPARIN SOD (PORK) LOCK FLUSH 100 UNIT/ML IV SOLN
500.0000 [IU] | Freq: Once | INTRAVENOUS | Status: AC
  Administered 2016-03-02: 500 [IU] via INTRAVENOUS
  Filled 2016-03-02: qty 5

## 2016-03-02 MED ORDER — DEXTROSE 5 % IV SOLN
20.0000 mg/m2 | Freq: Once | INTRAVENOUS | Status: AC
  Administered 2016-03-02: 30 mg via INTRAVENOUS
  Filled 2016-03-02: qty 15

## 2016-03-02 MED ORDER — SODIUM CHLORIDE 0.9 % IJ SOLN
10.0000 mL | Freq: Once | INTRAMUSCULAR | Status: AC
  Administered 2016-03-02: 10 mL via INTRAVENOUS
  Filled 2016-03-02: qty 10

## 2016-03-02 MED ORDER — SODIUM CHLORIDE 0.9 % IV SOLN
Freq: Once | INTRAVENOUS | Status: AC
  Administered 2016-03-02: 11:00:00 via INTRAVENOUS
  Filled 2016-03-02: qty 1000

## 2016-03-02 NOTE — Progress Notes (Signed)
[place]  [phone] Fax:(336) [phone]  ID: [name] OB: 02-27-1966  MR#: [id number]  WPY#:[id number]  [name], MD as PCP - General (Family Medicine)  CHIEF COMPLAINT: Multiple Myeloma in relapse with multiple bony lytic lesions, now with end-stage renal disease on dialysis.   INTERVAL HISTORY: Patient returns to clinic today for further evaluation and consideration of cycle 3, day 1 of Kyporlis. She continues to feel weak and fatigued between treatments but otherrwise is tolerating her treatments well. She had her fistula placed last week and she states that she feels well after procedure. She has stopped the Marinol because it made her "feel funny" but states she continues to have a good appetite. She has no neurologic complaints. She does not complain of any nausea or vomiting. She does complain of continued chronic back pain that is occasionally relieved with her prescribed pain medicine. She denies chest pain or shortness of breath.  She complains of mild constipation and gas pain between treatments. She has no urinary complaints. Patient offers no further specific complaints today.  REVIEW OF SYSTEMS:   Review of Systems  Constitutional: Positive for malaise/fatigue and weight loss. Negative for fever.  Respiratory: Negative.  Negative for cough and shortness of breath.   Cardiovascular: Negative.  Negative for chest pain.  Gastrointestinal: Negative.  Negative for abdominal pain, nausea and vomiting.  Musculoskeletal: Positive for back pain.  Neurological: Positive for weakness. Negative for tingling and focal weakness.  Psychiatric/Behavioral: Positive for depression. The patient is nervous/anxious.    As per HPI. Otherwise, a complete review of systems is negative.   PAST MEDICAL HISTORY: Past Medical History:  Diagnosis Date  . Anemia   . Anxiety   . Chronic kidney disease    DIALYSIS  .  Depression   . Diabetes mellitus without complication ([place])   . Dialysis patient ([place])    Mon, Wed, Fri  . History of chemotherapy    chemo on Tuesday and Wednesday  . Hypertension   . Multiple myeloma ([place])   . Neuropathy associated with cancer ([place])   . Osteoarthritis   . Pancreatitis   . Pneumonia    September 2017, [facility]  . Post-menopausal 2014  . Sepsis ([place])     PAST SURGICAL HISTORY: Past Surgical History:  Procedure Laterality Date  . AV FISTULA PLACEMENT Left 02/20/2016   Procedure: INSERTION OF ARTERIOVENOUS (AV) GORE-TEX GRAFT ARM;  Surgeon: Katha Cabal, MD;  Location: [facility] ORS;  Service: Vascular;  Laterality: Left;  . CESAREAN SECTION     x2  . dialysis catheter placement    . PERIPHERAL VASCULAR CATHETERIZATION N/A 11/19/2015   Procedure: Dialysis/Perma Catheter Insertion;  Surgeon: Algernon Huxley, MD;  Location: [place] [facility];  Service: Cardiovascular;  Laterality: N/A;  . PORTACATH PLACEMENT    . TUBAL LIGATION      FAMILY HISTORY: Reviewed and unchanged. No reported history of malignancy or chronic disease.     ADVANCED DIRECTIVES:    HEALTH MAINTENANCE: Social History  Substance Use Topics  . Smoking status: Never Smoker  . Smokeless tobacco: Never Used  . Alcohol use No     Allergies  Allergen Reactions  . No Known Allergies     Current Outpatient Prescriptions  Medication Sig Dispense Refill  . acetaminophen (TYLENOL) 500 MG tablet Take 500 mg by mouth every 6 (six) hours as needed.    . citalopram (CELEXA) 20 MG tablet Take 1 tablet (20 mg  total) by mouth daily. 30 tablet 5  . dronabinol (MARINOL) 2.5 MG capsule Take 1 capsule (2.5 mg total) by mouth 2 (two) times daily before a meal. 60 capsule 0  . ferrous sulfate 325 (65 FE) MG tablet Take 325 mg by mouth daily with breakfast.    . furosemide (LASIX) 80 MG tablet Take 1 tablet (80 mg total) by mouth daily. 30 tablet 0  . HYDROcodone-acetaminophen (NORCO) 5-325 MG tablet Take  1-2 tablets by mouth every 6 (six) hours as needed for moderate pain or severe pain. 50 tablet 0  . Insulin Detemir (LEVEMIR FLEXPEN) 100 UNIT/ML Pen Inject 5 Units into the skin daily at 10 pm.    . promethazine (PHENERGAN) 25 MG tablet Take 25 mg by mouth every 6 (six) hours as needed for nausea or vomiting.    Marland Kitchen VERAPAMIL HCL ER PO Take 120 mg by mouth daily.     No current facility-administered medications for this visit.    Facility-Administered Medications Ordered in Other Visits  Medication Dose Route Frequency Provider Last Rate Last Dose  . heparin lock flush 100 unit/mL  500 Units Intravenous Once Lloyd Huger, MD        OBJECTIVE: Vitals:   03/02/16 0920  BP: 119/64  Pulse: 93  Resp: 18  Temp: 97.2 F (36.2 C)     Body mass index is 19.43 kg/m.    ECOG FS:2 - Symptomatic, <50% confined to bed  General: Ill-appearing, no acute distress. Eyes: anicteric sclera. Lungs: Clear to auscultation bilaterally. Heart: Regular rate and rhythm. No rubs, murmurs, or gallops. Abdomen: Soft, nontender, nondistended. No organomegaly noted, normoactive bowel sounds. Musculoskeletal: No edema, cyanosis, or clubbing. Neuro: Alert, answering all questions appropriately. Cranial nerves grossly intact. Skin: No rashes or petechiae noted. Psych: Normal affect.   LAB RESULTS:  Lab Results  Component Value Date   NA 129 (L) 03/02/2016   K 5.1 03/02/2016   CL 92 (L) 03/02/2016   CO2 22 03/02/2016   GLUCOSE 167 (H) 03/02/2016   BUN 64 (H) 03/02/2016   CREATININE 5.51 (H) 03/02/2016   CALCIUM 9.7 03/02/2016   PROT 6.5 03/02/2016   ALBUMIN 2.7 (L) 03/02/2016   AST 28 03/02/2016   ALT 7 (L) 03/02/2016   ALKPHOS 167 (H) 03/02/2016   BILITOT 1.1 03/02/2016   GFRNONAA 8 (L) 03/02/2016   GFRAA 9 (L) 03/02/2016    Lab Results  Component Value Date   WBC 6.8 03/02/2016   NEUTROABS 4.6 03/02/2016   HGB 7.3 (L) 03/02/2016   HCT 21.7 (L) 03/02/2016   MCV 93.0 03/02/2016   PLT  198 03/02/2016   Lab Results  Component Value Date   TOTALPROTELP 6.3 02/17/2016   ALBUMINELP 3.2 02/17/2016   A1GS 0.4 02/17/2016   A2GS 1.1 (H) 02/17/2016   BETS 0.8 02/17/2016   GAMS 0.8 02/17/2016   MSPIKE 0.2 (H) 02/17/2016   SPEI Comment 02/17/2016     STUDIES: No results found.  ASSESSMENT: Multiple Myeloma in relapse with multiple bony lytic lesions, end-stage renal disease.   PLAN:    1. Multiple Myeloma in relapse with multiple bony lytic lesions: MRI, CT, bone scan results reviewed independently indicating progression of disease. Patient also has progressed end-stage renal disease, likely secondary to underlying myeloma. Patient's performance status has significantly declined, but she wishes to continue with aggressive treatment. Patient's kappa free light chain has trended down from previous visits and are pending from today. Proceed with cycle 3, day 1 of  Kyporlis. Given patient's decreased performance status, will not increase dose of Kyporlis. Patient will receive treatment on days 1, 2, 8, 9, 15, and 16. Kyporlis does not need to be dose reduced in the setting of end-stage renal disease and dialysis. Can consider Delton See given her renal failure. Dexamethasone has been discontinued given her difficult to control blood sugar. Because of patient's immigration status, she could not undergo transplant. Return to clinic tomorrow for treatment and in 1 week for consideration of cycle 3, day 8. 2. Back pain: Pain persists. Continue current narcotics as prescribed. 3. End-stage renal disease: Likely secondary to progression of disease. Continue dialysis on Monday, Wednesdays and Fridays. Had fistula placed last week.  4. Anemia: Hemoglobin decreased today.Scheduled to received one unit of PRBC next week. Monitor. Hemoglobin 7.3 today.  5. Hyperglycemia: Patient's blood glucose has improved since initiating insulin. Continue monitoring and treatment by primary care. Continue to hold  dexamethasone as above. 6. Thrombocytopenia: Multifactorial secondary to treatment as well as disease progression. Improved. 7. Depression: Continue Celexa 20 mg daily. 8. Weight Loss: Patient stopped taking due to making her "feel funny". She states that she has an improved appetite without the medication. Will continue to monitor. 9. Constipation: Encouraged OTC medications. Monitor.   The entire visit was done in the presence of an interpreter.  Patient expressed understanding and was in agreement with this plan. She also understands that She can call clinic at any time with any questions, concerns, or complaints.   Faythe Casa, NP 03/02/2016  12:00PM  Patient was seen and evaluated independently and I agree with the assessment and plan as dictated above. Patient's kappa free light chains have trended up slightly and now greater than 15,000. Her overall Lambda ratio has trended down to 455. Continue to monitor closely. Hesitant to add in additional treatments given her decreased performance status.  Lloyd Huger, MD 03/03/16 4:53 PM     Review of Systems  Psychiatric/Behavioral: Negative for depression.

## 2016-03-02 NOTE — Progress Notes (Signed)
Complains of pain in lower back that is not relieve with pain medication.

## 2016-03-02 NOTE — Progress Notes (Signed)
Hgb 7.3, Cr 5.51.MD aware.  Proceed with Kyprolis treatment per Dr Grayland Ormond.

## 2016-03-03 ENCOUNTER — Inpatient Hospital Stay: Payer: Self-pay

## 2016-03-03 VITALS — BP 120/72 | HR 86 | Temp 96.6°F | Resp 18

## 2016-03-03 DIAGNOSIS — C9002 Multiple myeloma in relapse: Secondary | ICD-10-CM

## 2016-03-03 LAB — KAPPA/LAMBDA LIGHT CHAINS
Kappa free light chain: 15776.9 mg/L — ABNORMAL HIGH (ref 3.3–19.4)
Kappa, lambda light chain ratio: 455.98 — ABNORMAL HIGH (ref 0.26–1.65)
Lambda free light chains: 34.6 mg/L — ABNORMAL HIGH (ref 5.7–26.3)

## 2016-03-03 MED ORDER — SODIUM CHLORIDE 0.9 % IV SOLN
Freq: Once | INTRAVENOUS | Status: AC
  Administered 2016-03-03: 09:00:00 via INTRAVENOUS
  Filled 2016-03-03: qty 1000

## 2016-03-03 MED ORDER — HEPARIN SOD (PORK) LOCK FLUSH 100 UNIT/ML IV SOLN
500.0000 [IU] | Freq: Once | INTRAVENOUS | Status: AC | PRN
  Administered 2016-03-03: 500 [IU]
  Filled 2016-03-03: qty 5

## 2016-03-03 MED ORDER — CARFILZOMIB CHEMO INJECTION 60 MG
20.0000 mg/m2 | Freq: Once | INTRAVENOUS | Status: AC
  Administered 2016-03-03: 30 mg via INTRAVENOUS
  Filled 2016-03-03: qty 15

## 2016-03-03 MED ORDER — SODIUM CHLORIDE 0.9% FLUSH
10.0000 mL | INTRAVENOUS | Status: DC | PRN
Start: 2016-03-03 — End: 2016-03-03
  Administered 2016-03-03: 10 mL
  Filled 2016-03-03: qty 10

## 2016-03-03 MED ORDER — PROCHLORPERAZINE MALEATE 10 MG PO TABS
10.0000 mg | ORAL_TABLET | Freq: Once | ORAL | Status: AC
  Administered 2016-03-03: 10 mg via ORAL
  Filled 2016-03-03: qty 1

## 2016-03-03 NOTE — Progress Notes (Signed)
Patient states, "I have a little facial swelling today, but am going to dialysis later on." MD, Dr. Grayland Ormond, notified via telephone. No new orders. Patient to proceed with treatment and dialysis as scheduled today.

## 2016-03-04 ENCOUNTER — Ambulatory Visit (INDEPENDENT_AMBULATORY_CARE_PROVIDER_SITE_OTHER): Payer: Self-pay | Admitting: Vascular Surgery

## 2016-03-04 ENCOUNTER — Encounter (INDEPENDENT_AMBULATORY_CARE_PROVIDER_SITE_OTHER): Payer: Self-pay | Admitting: Vascular Surgery

## 2016-03-04 VITALS — BP 116/50 | HR 64 | Resp 16 | Ht 60.0 in | Wt 100.0 lb

## 2016-03-04 DIAGNOSIS — Z992 Dependence on renal dialysis: Secondary | ICD-10-CM

## 2016-03-04 DIAGNOSIS — N186 End stage renal disease: Secondary | ICD-10-CM

## 2016-03-04 DIAGNOSIS — C9002 Multiple myeloma in relapse: Secondary | ICD-10-CM

## 2016-03-04 DIAGNOSIS — I150 Renovascular hypertension: Secondary | ICD-10-CM

## 2016-03-04 NOTE — Progress Notes (Signed)
Patient ID: Jamie Keller, female   DOB: Oct 31, 1965, 50 y.o.   MRN: [id number]  Chief Complaint  Patient presents with  . Routine Post Op    Fistula    HPI Jamie Keller is a 50 y.o. female.  No pain in left arm hand feels fine   Past Medical History:  Diagnosis Date  . Anemia   . Anxiety   . Chronic kidney disease    DIALYSIS  . Depression   . Diabetes mellitus without complication ([place])   . Dialysis patient ([place])    Mon, Wed, Fri  . History of chemotherapy    chemo on Tuesday and Wednesday  . Hypertension   . Multiple myeloma ([place])   . Neuropathy associated with cancer ([place])   . Osteoarthritis   . Pancreatitis   . Pneumonia    September 2017, [facility]  . Post-menopausal 2014  . Sepsis [facility])     Past Surgical History:  Procedure Laterality Date  . AV FISTULA PLACEMENT Left 02/20/2016   Procedure: INSERTION OF ARTERIOVENOUS (AV) GORE-TEX GRAFT ARM;  Surgeon: Katha Cabal, MD;  Location: [facility] ORS;  Service: Vascular;  Laterality: Left;  . CESAREAN SECTION     x2  . dialysis catheter placement    . PERIPHERAL VASCULAR CATHETERIZATION N/A 11/19/2015   Procedure: Dialysis/Perma Catheter Insertion;  Surgeon: Algernon Huxley, MD;  Location: [place] [facility];  Service: Cardiovascular;  Laterality: N/A;  . PORTACATH PLACEMENT    . TUBAL LIGATION        Allergies  Allergen Reactions  . No Known Allergies     Current Outpatient Prescriptions  Medication Sig Dispense Refill  . acetaminophen (TYLENOL) 500 MG tablet Take 500 mg by mouth every 6 (six) hours as needed.    . citalopram (CELEXA) 20 MG tablet Take 1 tablet (20 mg total) by mouth daily. 30 tablet 5  . dronabinol (MARINOL) 2.5 MG capsule Take 1 capsule (2.5 mg total) by mouth 2 (two) times daily before a meal. 60 capsule 0  . ferrous sulfate 325 (65 FE) MG tablet Take 325 mg by mouth daily with breakfast.    . furosemide (LASIX) 80 MG tablet Take 1 tablet (80 mg total) by mouth daily. 30  tablet 0  . HYDROcodone-acetaminophen (NORCO) 5-325 MG tablet Take 1-2 tablets by mouth every 6 (six) hours as needed for moderate pain or severe pain. 50 tablet 0  . Insulin Detemir (LEVEMIR FLEXPEN) 100 UNIT/ML Pen Inject 5 Units into the skin daily at 10 pm.    . promethazine (PHENERGAN) 25 MG tablet Take 25 mg by mouth every 6 (six) hours as needed for nausea or vomiting.    Marland Kitchen VERAPAMIL HCL ER PO Take 120 mg by mouth daily.     No current facility-administered medications for this visit.         Physical Exam BP (!) 116/50 (BP Location: Right Arm)   Pulse 64   Resp 16   Ht 5' (1.524 m)   Wt 45.4 kg (100 lb)   LMP 02/25/1995 Comment: Tubal ligation 1996  BMI 19.53 kg/m  Gen:  WD/WN, NAD Skin: incisions C/D/I Left arm AV graft good thrill good bruit 1+ radial left radial pulse     Assessment/Plan:  S/P creation of left arm brachial axillary av graft OK to begin cannulation in one more week 03/12/2016 F/U with HDA in 3 months     Hortencia Pilar 03/04/2016, 9:00 AM   This note was created  with Dragon medical transcription system.  Any errors from dictation are unintentional.

## 2016-03-08 NOTE — Progress Notes (Signed)
[place]  Telephone:(336) [phone] Fax:(336) [phone]  ID: Jamie Keller OB: [birth year]  MR#: [id number]  TZG#:[id number]  [name], MD as PCP - General (Family Medicine)  CHIEF COMPLAINT: Multiple Myeloma in relapse with multiple bony lytic lesions, now with end-stage renal disease on dialysis.   INTERVAL HISTORY: Patient returns to clinic today for further evaluation and consideration of cycle 3, day 8 of Kyporlis. She continues to feel weak and fatigued between treatments and for the past five days has had several episodes of nausea and vomiting. She feels like her legs "are heavy".  She had her fistula placed a few weeks ago and is continuing dialysis three days a week. She continues to have a good appetite even after stopping the Marinol due to making her head "feel funny". She has no neurologic complaints. She does complain of continued chronic back pain that is occasionally relieved with her prescribed pain medicine. She denies chest pain or shortness of breath.  She complains of mild constipation and gas pain between treatments. She has no urinary complaints. Patient offers no further specific complaints today.  REVIEW OF SYSTEMS:   Review of Systems  Constitutional: Positive for malaise/fatigue and weight loss. Negative for fever.  Respiratory: Negative.  Negative for cough and shortness of breath.   Cardiovascular: Negative.  Negative for chest pain.  Gastrointestinal: Negative.  Negative for abdominal pain, nausea and vomiting.  Musculoskeletal: Positive for back pain.  Neurological: Positive for weakness. Negative for tingling and focal weakness.  Psychiatric/Behavioral: Positive for depression. The patient is nervous/anxious.    As per HPI. Otherwise, a complete review of systems is negative.   PAST MEDICAL HISTORY: Past Medical History:  Diagnosis Date  . Anemia   . Anxiety   . Chronic kidney disease    DIALYSIS  . Depression   . Diabetes mellitus without complication ([place])   . Dialysis patient ([place])    Mon, Wed, Fri  . History of chemotherapy    chemo on Tuesday and Wednesday  . Hypertension   . Multiple myeloma ([place])   . Neuropathy associated with cancer ([place])   . Osteoarthritis   . Pancreatitis   . Pneumonia    September 2017, [facility]  . Post-menopausal 2014  . Sepsis ([place])     PAST SURGICAL HISTORY: Past Surgical History:  Procedure Laterality Date  . AV FISTULA PLACEMENT Left 02/20/2016   Procedure: INSERTION OF ARTERIOVENOUS (AV) GORE-TEX GRAFT ARM;  Surgeon: Katha Cabal, MD;  Location: [facility] ORS;  Service: Vascular;  Laterality: Left;  . CESAREAN SECTION     x2  . dialysis catheter placement    . PERIPHERAL VASCULAR CATHETERIZATION N/A 11/19/2015   Procedure: Dialysis/Perma Catheter Insertion;  Surgeon: Algernon Huxley, MD;  Location: [place] [facility];  Service: Cardiovascular;  Laterality: N/A;  . PORTACATH PLACEMENT    . TUBAL LIGATION      FAMILY HISTORY: Reviewed and unchanged. No reported history of malignancy or chronic disease.     ADVANCED DIRECTIVES:    HEALTH MAINTENANCE: Social History  Substance Use Topics  . Smoking status: Never Smoker  . Smokeless tobacco: Never Used  . Alcohol use No     Allergies  Allergen Reactions  . No Known Allergies     Current Outpatient Prescriptions  Medication Sig Dispense Refill  . acetaminophen (TYLENOL) 500 MG tablet Take 500 mg by mouth every 6 (six) hours as needed.    . citalopram (CELEXA) 20 MG tablet  Take 1 tablet (20 mg total) by mouth daily. 30 tablet 5  . dronabinol (MARINOL) 2.5 MG capsule Take 1 capsule (2.5 mg total) by mouth 2 (two) times daily before a meal. 60 capsule 0  . HYDROcodone-acetaminophen (NORCO) 5-325 MG tablet Take 1-2 tablets by mouth every 6 (six) hours as needed for moderate pain or severe pain. 50 tablet 0  . Insulin Detemir (LEVEMIR FLEXPEN) 100 UNIT/ML Pen Inject 5 Units  into the skin daily at 10 pm.    . promethazine (PHENERGAN) 25 MG tablet Take 25 mg by mouth every 6 (six) hours as needed for nausea or vomiting.    Marland Kitchen VERAPAMIL HCL ER PO Take 120 mg by mouth daily.    . ferrous sulfate 325 (65 FE) MG tablet Take 325 mg by mouth daily with breakfast.    . furosemide (LASIX) 80 MG tablet Take 1 tablet (80 mg total) by mouth daily. (Patient not taking: Reported on 03/09/2016) 30 tablet 0   No current facility-administered medications for this visit.    Facility-Administered Medications Ordered in Other Visits  Medication Dose Route Frequency Provider Last Rate Last Dose  . heparin lock flush 100 unit/mL  500 Units Intracatheter Once PRN Lloyd Huger, MD      . sodium chloride flush (NS) 0.9 % injection 10 mL  10 mL Intracatheter PRN Lloyd Huger, MD        OBJECTIVE: Vitals:   03/09/16 0915  BP: 133/76  Pulse: 89  Resp: 18  Temp: 97.6 F (36.4 C)     Body mass index is 20.39 kg/m.    ECOG FS:2 - Symptomatic, <50% confined to bed  General: Ill-appearing, no acute distress. Eyes: anicteric sclera. Lungs: Clear to auscultation bilaterally. Heart: Regular rate and rhythm. No rubs, murmurs, or gallops. Abdomen: Soft, nontender, nondistended. No organomegaly noted, normoactive bowel sounds. Musculoskeletal: No edema, cyanosis, or clubbing. Neuro: Alert, answering all questions appropriately. Cranial nerves grossly intact. Skin: No rashes or petechiae noted. Psych: Normal affect.   LAB RESULTS:  Lab Results  Component Value Date   NA 133 (L) 03/09/2016   K 4.0 03/09/2016   CL 97 (L) 03/09/2016   CO2 27 03/09/2016   GLUCOSE 165 (H) 03/09/2016   BUN 38 (H) 03/09/2016   CREATININE 3.58 (H) 03/09/2016   CALCIUM 9.2 03/09/2016   PROT 6.0 (L) 03/09/2016   ALBUMIN 2.6 (L) 03/09/2016   AST 100 (H) 03/09/2016   ALT 62 (H) 03/09/2016   ALKPHOS 161 (H) 03/09/2016   BILITOT 1.2 03/09/2016   GFRNONAA 14 (L) 03/09/2016   GFRAA 16 (L)  03/09/2016    Lab Results  Component Value Date   WBC 6.9 03/09/2016   NEUTROABS 5.0 03/09/2016   HGB 8.0 (L) 03/09/2016   HCT 24.0 (L) 03/09/2016   MCV 93.6 03/09/2016   PLT 164 03/09/2016   Lab Results  Component Value Date   TOTALPROTELP 6.3 02/17/2016   ALBUMINELP 3.2 02/17/2016   A1GS 0.4 02/17/2016   A2GS 1.1 (H) 02/17/2016   BETS 0.8 02/17/2016   GAMS 0.8 02/17/2016   MSPIKE 0.2 (H) 02/17/2016   SPEI Comment 02/17/2016     STUDIES: No results found.  ASSESSMENT: Multiple Myeloma in relapse with multiple bony lytic lesions, end-stage renal disease.   PLAN:    1. Multiple Myeloma in relapse with multiple bony lytic lesions: MRI, CT, bone scan results reviewed independently indicating progression of disease. Patient also has progressed end-stage renal disease, likely secondary to underlying  myeloma. Patient's performance status has significantly declined, but she wishes to continue with aggressive treatment. Patient's kappa free light chain has trended up from previous visit. Her overall Lambda ratio has trended down to 455. Closely monitor. Proceed with cycle 3, day 8 of Kyporlis. Given patient's decreased performance status, will not increase dose of Kyporlis. Patient will receive treatment on days 1, 2, 8, 9, 15, and 16. Kyporlis does not need to be dose reduced in the setting of end-stage renal disease and dialysis. Can consider Delton See given her renal failure. Dexamethasone has been discontinued given her difficult to control blood sugar. Because of patient's immigration status, she could not undergo transplant. Return to clinic tomorrow for treatment and in 1 week for consideration of cycle 3, day 15. 2. Back pain: Pain persists. Continue current narcotics as prescribed. 3. End-stage renal disease: Likely secondary to progression of disease. Continue dialysis on Monday, Wednesdays and Fridays. Has fistula. 4. Anemia: Hemoglobin slightly increased today from last week but  given her nausea and vomiting she may be volume depleted. Proceed with one unit of PRBC today. Monitor. Hemoglobin 8.0 today.  5. Hyperglycemia: Patient's blood glucose has improved since initiating insulin. Continue monitoring and treatment by primary care. Continue to hold dexamethasone as above. 6. Thrombocytopenia: Multifactorial secondary to treatment as well as disease progression. Improved. 7. Depression: Continue Celexa 20 mg daily. 8. Weight Loss: Stable without medication. Patient stopped taking Marinol due to making her "feel funny". She states that she has an improved appetite without the medication. Will continue to monitor. 9. Constipation: Encouraged OTC medications. Monitor.   The entire visit was done in the presence of an interpreter.  Patient expressed understanding and was in agreement with this plan. She also understands that She can call clinic at any time with any questions, concerns, or complaints.   Faythe Casa, NP 03/10/2015 10:00 AM   Patient was seen and evaluated independently and I agree with the assessment and plan as dictated above. Hesitant to add in additional treatments given her decreased performance status.  Lloyd Huger, MD 03/09/16 2:22 PM     Review of Systems  Gastrointestinal: Positive for nausea and vomiting.  Neurological: Positive for weakness.  Psychiatric/Behavioral: Negative for depression.

## 2016-03-09 ENCOUNTER — Encounter (INDEPENDENT_AMBULATORY_CARE_PROVIDER_SITE_OTHER): Payer: Self-pay

## 2016-03-09 ENCOUNTER — Inpatient Hospital Stay: Payer: Self-pay | Attending: Oncology | Admitting: Oncology

## 2016-03-09 ENCOUNTER — Inpatient Hospital Stay: Payer: Self-pay

## 2016-03-09 VITALS — BP 133/76 | HR 89 | Temp 97.6°F | Resp 18 | Wt 104.4 lb

## 2016-03-09 VITALS — BP 128/76 | HR 88 | Temp 97.6°F | Resp 20

## 2016-03-09 DIAGNOSIS — N186 End stage renal disease: Secondary | ICD-10-CM | POA: Insufficient documentation

## 2016-03-09 DIAGNOSIS — C9002 Multiple myeloma in relapse: Secondary | ICD-10-CM | POA: Insufficient documentation

## 2016-03-09 DIAGNOSIS — E119 Type 2 diabetes mellitus without complications: Secondary | ICD-10-CM | POA: Insufficient documentation

## 2016-03-09 DIAGNOSIS — Z5111 Encounter for antineoplastic chemotherapy: Secondary | ICD-10-CM | POA: Insufficient documentation

## 2016-03-09 DIAGNOSIS — I129 Hypertensive chronic kidney disease with stage 1 through stage 4 chronic kidney disease, or unspecified chronic kidney disease: Secondary | ICD-10-CM | POA: Insufficient documentation

## 2016-03-09 DIAGNOSIS — L299 Pruritus, unspecified: Secondary | ICD-10-CM | POA: Insufficient documentation

## 2016-03-09 DIAGNOSIS — D696 Thrombocytopenia, unspecified: Secondary | ICD-10-CM | POA: Insufficient documentation

## 2016-03-09 DIAGNOSIS — E1165 Type 2 diabetes mellitus with hyperglycemia: Secondary | ICD-10-CM | POA: Insufficient documentation

## 2016-03-09 DIAGNOSIS — R112 Nausea with vomiting, unspecified: Secondary | ICD-10-CM | POA: Insufficient documentation

## 2016-03-09 DIAGNOSIS — R140 Abdominal distension (gaseous): Secondary | ICD-10-CM | POA: Insufficient documentation

## 2016-03-09 DIAGNOSIS — K5900 Constipation, unspecified: Secondary | ICD-10-CM | POA: Insufficient documentation

## 2016-03-09 DIAGNOSIS — G893 Neoplasm related pain (acute) (chronic): Secondary | ICD-10-CM | POA: Insufficient documentation

## 2016-03-09 DIAGNOSIS — F329 Major depressive disorder, single episode, unspecified: Secondary | ICD-10-CM | POA: Insufficient documentation

## 2016-03-09 DIAGNOSIS — R0602 Shortness of breath: Secondary | ICD-10-CM | POA: Insufficient documentation

## 2016-03-09 DIAGNOSIS — F419 Anxiety disorder, unspecified: Secondary | ICD-10-CM | POA: Insufficient documentation

## 2016-03-09 DIAGNOSIS — Z992 Dependence on renal dialysis: Secondary | ICD-10-CM | POA: Insufficient documentation

## 2016-03-09 DIAGNOSIS — R5383 Other fatigue: Secondary | ICD-10-CM | POA: Insufficient documentation

## 2016-03-09 DIAGNOSIS — M549 Dorsalgia, unspecified: Secondary | ICD-10-CM | POA: Insufficient documentation

## 2016-03-09 DIAGNOSIS — Z8701 Personal history of pneumonia (recurrent): Secondary | ICD-10-CM | POA: Insufficient documentation

## 2016-03-09 DIAGNOSIS — D649 Anemia, unspecified: Secondary | ICD-10-CM | POA: Insufficient documentation

## 2016-03-09 DIAGNOSIS — R634 Abnormal weight loss: Secondary | ICD-10-CM | POA: Insufficient documentation

## 2016-03-09 DIAGNOSIS — G4700 Insomnia, unspecified: Secondary | ICD-10-CM | POA: Insufficient documentation

## 2016-03-09 DIAGNOSIS — Z79899 Other long term (current) drug therapy: Secondary | ICD-10-CM | POA: Insufficient documentation

## 2016-03-09 DIAGNOSIS — M1990 Unspecified osteoarthritis, unspecified site: Secondary | ICD-10-CM | POA: Insufficient documentation

## 2016-03-09 DIAGNOSIS — R531 Weakness: Secondary | ICD-10-CM | POA: Insufficient documentation

## 2016-03-09 DIAGNOSIS — G629 Polyneuropathy, unspecified: Secondary | ICD-10-CM | POA: Insufficient documentation

## 2016-03-09 DIAGNOSIS — Z794 Long term (current) use of insulin: Secondary | ICD-10-CM | POA: Insufficient documentation

## 2016-03-09 DIAGNOSIS — Z8619 Personal history of other infectious and parasitic diseases: Secondary | ICD-10-CM | POA: Insufficient documentation

## 2016-03-09 LAB — COMPREHENSIVE METABOLIC PANEL
ALK PHOS: 161 U/L — AB (ref 38–126)
ALT: 62 U/L — AB (ref 14–54)
AST: 100 U/L — AB (ref 15–41)
Albumin: 2.6 g/dL — ABNORMAL LOW (ref 3.5–5.0)
Anion gap: 9 (ref 5–15)
BUN: 38 mg/dL — AB (ref 6–20)
CALCIUM: 9.2 mg/dL (ref 8.9–10.3)
CO2: 27 mmol/L (ref 22–32)
CREATININE: 3.58 mg/dL — AB (ref 0.44–1.00)
Chloride: 97 mmol/L — ABNORMAL LOW (ref 101–111)
GFR calc non Af Amer: 14 mL/min — ABNORMAL LOW (ref >60)
GFR, EST AFRICAN AMERICAN: 16 mL/min — AB (ref >60)
GLUCOSE: 165 mg/dL — AB (ref 65–99)
Potassium: 4 mmol/L (ref 3.5–5.1)
SODIUM: 133 mmol/L — AB (ref 135–145)
Total Bilirubin: 1.2 mg/dL (ref 0.3–1.2)
Total Protein: 6 g/dL — ABNORMAL LOW (ref 6.5–8.1)

## 2016-03-09 LAB — CBC WITH DIFFERENTIAL/PLATELET
BASOS PCT: 1 %
Basophils Absolute: 0 10*3/uL (ref 0–0.1)
EOS PCT: 0 %
Eosinophils Absolute: 0 10*3/uL (ref 0–0.7)
HCT: 24 % — ABNORMAL LOW (ref 35.0–47.0)
HEMOGLOBIN: 8 g/dL — AB (ref 12.0–16.0)
Lymphocytes Relative: 11 %
Lymphs Abs: 0.7 10*3/uL — ABNORMAL LOW (ref 1.0–3.6)
MCH: 31.1 pg (ref 26.0–34.0)
MCHC: 33.2 g/dL (ref 32.0–36.0)
MCV: 93.6 fL (ref 80.0–100.0)
Monocytes Absolute: 1.2 10*3/uL — ABNORMAL HIGH (ref 0.2–0.9)
Monocytes Relative: 17 %
NEUTROS ABS: 5 10*3/uL (ref 1.4–6.5)
Neutrophils Relative %: 71 %
Platelets: 164 10*3/uL (ref 150–440)
RBC: 2.56 MIL/uL — AB (ref 3.80–5.20)
RDW: 24.9 % — ABNORMAL HIGH (ref 11.5–14.5)
WBC: 6.9 10*3/uL (ref 3.6–11.0)

## 2016-03-09 LAB — PREPARE RBC (CROSSMATCH)

## 2016-03-09 LAB — SAMPLE TO BLOOD BANK

## 2016-03-09 MED ORDER — DEXTROSE 5 % IV SOLN
20.0000 mg/m2 | Freq: Once | INTRAVENOUS | Status: AC
  Administered 2016-03-09: 30 mg via INTRAVENOUS
  Filled 2016-03-09: qty 15

## 2016-03-09 MED ORDER — SODIUM CHLORIDE 0.9 % IV SOLN: Freq: Once | INTRAVENOUS | Status: DC

## 2016-03-09 MED ORDER — SODIUM CHLORIDE 0.9% FLUSH
10.0000 mL | INTRAVENOUS | Status: DC | PRN
  Filled 2016-03-09: qty 10

## 2016-03-09 MED ORDER — HEPARIN SOD (PORK) LOCK FLUSH 100 UNIT/ML IV SOLN
500.0000 [IU] | Freq: Once | INTRAVENOUS | Status: AC | PRN
  Administered 2016-03-09: 500 [IU]
  Filled 2016-03-09 (×2): qty 5

## 2016-03-09 MED ORDER — SODIUM CHLORIDE 0.9 % IV SOLN
Freq: Once | INTRAVENOUS | Status: AC
  Administered 2016-03-09: 10:00:00 via INTRAVENOUS
  Filled 2016-03-09: qty 1000

## 2016-03-09 MED ORDER — ACETAMINOPHEN 325 MG PO TABS
650.0000 mg | ORAL_TABLET | Freq: Once | ORAL | Status: AC
  Administered 2016-03-09: 650 mg via ORAL
  Filled 2016-03-09: qty 2

## 2016-03-09 MED ORDER — SODIUM CHLORIDE 0.9 % IV SOLN
Freq: Once | INTRAVENOUS | Status: AC
  Administered 2016-03-09: 11:00:00 via INTRAVENOUS
  Filled 2016-03-09: qty 1000

## 2016-03-09 MED ORDER — ONDANSETRON 8 MG PO TBDP
8.0000 mg | ORAL_TABLET | Freq: Once | ORAL | Status: AC
  Administered 2016-03-09: 8 mg via ORAL
  Filled 2016-03-09: qty 1

## 2016-03-09 MED ORDER — DIPHENHYDRAMINE HCL 50 MG/ML IJ SOLN
25.0000 mg | Freq: Once | INTRAMUSCULAR | Status: AC
  Administered 2016-03-09: 25 mg via INTRAVENOUS
  Filled 2016-03-09: qty 1

## 2016-03-09 MED ORDER — PROCHLORPERAZINE MALEATE 10 MG PO TABS
10.0000 mg | ORAL_TABLET | Freq: Once | ORAL | Status: AC
  Administered 2016-03-09: 10 mg via ORAL
  Filled 2016-03-09: qty 1

## 2016-03-09 NOTE — Progress Notes (Signed)
Here for follow up. Feeling weak esp 3 x day w vomiting 3-4 x d. vomtted once today.  Stated legs feel weak and heavy all the time. Brought to clinic in w/c w translator needs renewal for phenergan per pt.

## 2016-03-10 ENCOUNTER — Encounter
Admission: RE | Disposition: A | Discharge status: Home / Self Care | Payer: Self-pay | Source: Ambulatory Visit | Attending: Vascular Surgery

## 2016-03-10 ENCOUNTER — Inpatient Hospital Stay: Payer: Self-pay

## 2016-03-10 ENCOUNTER — Ambulatory Visit
Admission: RE | Admit: 2016-03-10 | Discharge: 2016-03-10 | Disposition: A | Discharge status: Home / Self Care | Payer: Self-pay | Source: Ambulatory Visit | Attending: Vascular Surgery | Admitting: Vascular Surgery

## 2016-03-10 DIAGNOSIS — Y832 Surgical operation with anastomosis, bypass or graft as the cause of abnormal reaction of the patient, or of later complication, without mention of misadventure at the time of the procedure: Secondary | ICD-10-CM | POA: Insufficient documentation

## 2016-03-10 DIAGNOSIS — Z8619 Personal history of other infectious and parasitic diseases: Secondary | ICD-10-CM | POA: Insufficient documentation

## 2016-03-10 DIAGNOSIS — Z9851 Tubal ligation status: Secondary | ICD-10-CM | POA: Insufficient documentation

## 2016-03-10 DIAGNOSIS — N186 End stage renal disease: Secondary | ICD-10-CM

## 2016-03-10 DIAGNOSIS — Z992 Dependence on renal dialysis: Secondary | ICD-10-CM | POA: Insufficient documentation

## 2016-03-10 DIAGNOSIS — Z8249 Family history of ischemic heart disease and other diseases of the circulatory system: Secondary | ICD-10-CM | POA: Insufficient documentation

## 2016-03-10 DIAGNOSIS — M1990 Unspecified osteoarthritis, unspecified site: Secondary | ICD-10-CM | POA: Insufficient documentation

## 2016-03-10 DIAGNOSIS — Z9221 Personal history of antineoplastic chemotherapy: Secondary | ICD-10-CM | POA: Insufficient documentation

## 2016-03-10 DIAGNOSIS — C9000 Multiple myeloma not having achieved remission: Secondary | ICD-10-CM | POA: Insufficient documentation

## 2016-03-10 DIAGNOSIS — D649 Anemia, unspecified: Secondary | ICD-10-CM | POA: Insufficient documentation

## 2016-03-10 DIAGNOSIS — Z780 Asymptomatic menopausal state: Secondary | ICD-10-CM | POA: Insufficient documentation

## 2016-03-10 DIAGNOSIS — I120 Hypertensive chronic kidney disease with stage 5 chronic kidney disease or end stage renal disease: Secondary | ICD-10-CM | POA: Insufficient documentation

## 2016-03-10 DIAGNOSIS — T82868A Thrombosis of vascular prosthetic devices, implants and grafts, initial encounter: Secondary | ICD-10-CM | POA: Insufficient documentation

## 2016-03-10 DIAGNOSIS — E1122 Type 2 diabetes mellitus with diabetic chronic kidney disease: Secondary | ICD-10-CM | POA: Insufficient documentation

## 2016-03-10 HISTORY — PX: PERIPHERAL VASCULAR CATHETERIZATION: SHX172C

## 2016-03-10 LAB — POTASSIUM (ARMC VASCULAR LAB ONLY): Potassium (ARMC vascular lab): 5.6 — ABNORMAL HIGH (ref 3.5–5.1)

## 2016-03-10 LAB — KAPPA/LAMBDA LIGHT CHAINS
KAPPA FREE LGHT CHN: 13995 mg/L — AB (ref 3.3–19.4)
Kappa, lambda light chain ratio: 714.03 — ABNORMAL HIGH (ref 0.26–1.65)
Lambda free light chains: 19.6 mg/L (ref 5.7–26.3)

## 2016-03-10 LAB — TYPE AND SCREEN
Blood Product Expiration Date: 201801232359
ISSUE DATE / TIME: 201801021157
Unit Type and Rh: 1700

## 2016-03-10 LAB — PROTEIN ELECTROPHORESIS, SERUM
A/G Ratio: 1.1 (ref 0.7–1.7)
ALBUMIN ELP: 2.8 g/dL — AB (ref 2.9–4.4)
ALPHA-1-GLOBULIN: 0.3 g/dL (ref 0.0–0.4)
ALPHA-2-GLOBULIN: 0.8 g/dL (ref 0.4–1.0)
Beta Globulin: 0.9 g/dL (ref 0.7–1.3)
GAMMA GLOBULIN: 0.4 g/dL (ref 0.4–1.8)
GLOBULIN, TOTAL: 2.6 g/dL (ref 2.2–3.9)
M-Spike, %: 0.4 g/dL — ABNORMAL HIGH
PDF: 0
Total Protein ELP: 5.4 g/dL — ABNORMAL LOW (ref 6.0–8.5)

## 2016-03-10 LAB — GLUCOSE, CAPILLARY: Glucose-Capillary: 138 mg/dL — ABNORMAL HIGH (ref 65–99)

## 2016-03-10 SURGERY — THROMBECTOMY
Anesthesia: Moderate Sedation | Laterality: Left

## 2016-03-10 MED ORDER — HEPARIN SODIUM (PORCINE) 1000 UNIT/ML IJ SOLN
INTRAMUSCULAR | Status: DC | PRN
  Administered 2016-03-10: 3000 [IU] via INTRAVENOUS

## 2016-03-10 MED ORDER — FENTANYL CITRATE (PF) 100 MCG/2ML IJ SOLN
INTRAMUSCULAR | Status: AC
  Filled 2016-03-10: qty 2

## 2016-03-10 MED ORDER — LIDOCAINE HCL (PF) 1 % IJ SOLN
INTRAMUSCULAR | Status: AC
  Filled 2016-03-10: qty 10

## 2016-03-10 MED ORDER — DIPHENHYDRAMINE HCL 50 MG/ML IJ SOLN
INTRAMUSCULAR | Status: DC | PRN
  Administered 2016-03-10: 25 mg via INTRAVENOUS

## 2016-03-10 MED ORDER — FENTANYL CITRATE (PF) 100 MCG/2ML IJ SOLN
INTRAMUSCULAR | Status: AC
Start: 2016-03-10 — End: 2016-03-10
  Filled 2016-03-10: qty 2

## 2016-03-10 MED ORDER — SODIUM CHLORIDE 0.9 % IV SOLN
INTRAVENOUS | Status: DC
  Administered 2016-03-10: 13:00:00 via INTRAVENOUS

## 2016-03-10 MED ORDER — HEPARIN (PORCINE) IN NACL 2-0.9 UNIT/ML-% IJ SOLN
INTRAMUSCULAR | Status: AC
  Filled 2016-03-10: qty 1000

## 2016-03-10 MED ORDER — DIPHENHYDRAMINE HCL 50 MG/ML IJ SOLN
INTRAMUSCULAR | Status: AC
  Filled 2016-03-10: qty 1

## 2016-03-10 MED ORDER — MIDAZOLAM HCL 2 MG/2ML IJ SOLN
INTRAMUSCULAR | Status: AC
  Filled 2016-03-10: qty 4

## 2016-03-10 MED ORDER — MIDAZOLAM HCL 2 MG/2ML IJ SOLN
INTRAMUSCULAR | Status: DC | PRN
Start: 2016-03-10 — End: 2016-03-10
  Administered 2016-03-10: 2 mg via INTRAVENOUS
  Administered 2016-03-10: 0.5 mg via INTRAVENOUS

## 2016-03-10 MED ORDER — FENTANYL CITRATE (PF) 100 MCG/2ML IJ SOLN
INTRAMUSCULAR | Status: DC | PRN
  Administered 2016-03-10 (×2): 25 ug via INTRAVENOUS
  Administered 2016-03-10: 50 ug via INTRAVENOUS

## 2016-03-10 MED ORDER — HEPARIN SOD (PORK) LOCK FLUSH 100 UNIT/ML IV SOLN
INTRAVENOUS | Status: AC
  Filled 2016-03-10: qty 5

## 2016-03-10 MED ORDER — HEPARIN SODIUM (PORCINE) 1000 UNIT/ML IJ SOLN
INTRAMUSCULAR | Status: AC
  Filled 2016-03-10: qty 1

## 2016-03-10 SURGICAL SUPPLY — 17 items
BALLN DORADO 7X40X80 (BALLOONS) ×3
BALLOON DORADO 7X40X80 (BALLOONS) ×1 IMPLANT
CATH TORCON 5FR 0.38 (CATHETERS) ×3 IMPLANT
DEVICE PRESTO INFLATION (MISCELLANEOUS) ×3 IMPLANT
DEVICE TORQUE (MISCELLANEOUS) ×3 IMPLANT
DRAPE BRACHIAL (DRAPES) ×3 IMPLANT
GRAFT STENT FLAIR ENDOVAS 7X40 (Stent) ×3 IMPLANT
GUIDEWIRE ANGLED .035 180CM (WIRE) ×3 IMPLANT
KIT THROMB PERC PTD (MISCELLANEOUS) ×3 IMPLANT
NEEDLE ENTRY 21GA 7CM ECHOTIP (NEEDLE) ×3 IMPLANT
PACK ANGIOGRAPHY (CUSTOM PROCEDURE TRAY) ×3 IMPLANT
SET INTRO CAPELLA COAXIAL (SET/KITS/TRAYS/PACK) ×3 IMPLANT
SHEATH BRITE TIP 6FRX5.5 (SHEATH) ×6 IMPLANT
SHEATH BRITE TIP 7FRX5.5 (SHEATH) ×3 IMPLANT
SUT MNCRL AB 4-0 PS2 18 (SUTURE) ×3 IMPLANT
TOWEL OR 17X26 4PK STRL BLUE (TOWEL DISPOSABLE) ×3 IMPLANT
WIRE MAGIC TORQUE 260C (WIRE) ×3 IMPLANT

## 2016-03-10 NOTE — Op Note (Signed)
OPERATIVE NOTE   PROCEDURE: 1. Contrast injection left arm brachial axillary dialysis graft 2. Mechanical thrombectomy with Trerotola device left arm brachial axillary dialysis graft 3. Percutaneous transluminal angioplasty and stent placement venous outflow left arm brachial axillary dialysis graft  PRE-OPERATIVE DIAGNOSIS: Complication of dialysis access                                                       End Stage Renal Disease  POST-OPERATIVE DIAGNOSIS: same as above   SURGEON: Katha Cabal, M.D.  ANESTHESIA: Conscious Sedation   ESTIMATED BLOOD LOSS: minimal  FINDING(S): 1. Thrombus within the graft, narrowing at the venous anastomosis  SPECIMEN(S):  None  CONTRAST: 35 cc  FLUOROSCOPY TIME: 4.9 minutes  INDICATIONS: Jamie Keller is a 51 y.o. female who  presents with malfunctioning left brachial axillary AV access.  The patient is scheduled for angiography with possible intervention of the AV access.  The patient is aware the risks include but are not limited to: bleeding, infection, thrombosis of the cannulated access, and possible anaphylactic reaction to the contrast.  The patient acknowledges if the access can not be salvaged a tunneled catheter will be needed and will be placed during this procedure.  The patient is aware of the risks of the procedure and elects to proceed with the angiogram and intervention.  DESCRIPTION: After full informed written consent was obtained, the patient was brought back to the Special Procedure suite and placed supine position.  Appropriate cardiopulmonary monitors were placed.  The left arm was prepped and draped in the standard fashion.  Appropriate timeout is called. The left brachial axillary dialysis graft  was cannulated with a micropuncture needle. Ultrasound was placed in a sterile sleeve ultrasound was then used to image the graft which was noted to have heterogeneous material in it consistent with thrombosis of her  dialysis graft. Images recorded for the permanent record. Microneedle is inserted into the graft under direct ultrasound visualization. The microwire was advanced and the needle was exchanged for  a microsheath.  The J-wire was then advanced and a 6 Fr sheath inserted.  Hand was then performed which demonstrated thrombus within the AV access.  The central venous structures were also imaged by hand injections.  3000 units of heparin was given and allowed to circulate as well.  A Trerotola device was then advanced beginning centrally and pulling back performing mechanical thrombectomy.  Several passes were made through the venous portion of the graft. Follow-up imaging now demonstrates the vast majority of the clot had been treated and a narrowing at the venous anastomosis was uncovered.   Therefore a retrograde sheath was inserted. This too was a 6 [place] sheath was positioned more proximally on the arm and angled in the retrograde direction. Subsequently a floppy Glidewire and a KMP catheter were negotiated into the arterial system hand injection contrast was then utilized to demonstrate patency of the artery as well as the location for the anastomosis. The Trerotola device was now advanced through the retrograde sheath was extended out into the artery the basket was opened and it was slowly pulled back into the graft and then the basket was engaged. Several passes were made on the arterial portion and pulsatility of the access was reestablished. Follow-up imaging demonstrates there was now thrombus in the venous portion surrounding the  sheath and this was treated with the Trerotola device from the antegrade direction. After several more passes, imaging demonstrated resolution of thrombus within the graft and forward flow however stricture of the graft was noted at the venous anastomosis  Magic torque wire was then advanced through the antegrade sheath and a 7 x 40 flared flare stent was deployed across the  lesion. Subsequently, a 7 x 40 mm [place] balloon was used to angioplasty the venous portion of the AV access. A single inflation was performed inflation time was for 30 seconds with maximum pressures of 20 ATM.  With the balloon inflated reflux of contrast was performed demonstrating the arterial anastomosis. The arterial anastomosis was widely patent. Contrast was then injected in the forward direction demonstrating rapid flow.  A 4-0 Monocryl purse-string suture was sewn around both of the sheaths.  The sheaths were removed and light pressure was applied.  A sterile bandage was applied to the puncture site.    COMPLICATIONS: None  CONDITION: Improved  Katha Cabal, M.D [facility] Office: [phone]  03/10/2016 3:00 PM

## 2016-03-10 NOTE — H&P (Signed)
[facility] Admission History & Physical  MRN : [id number]  Saprina Keller is a 51 y.o. (10-Feb-1966) female who presents with chief complaint of No chief complaint on file. Jamie Keller  History of Present Illness: I am asked to evaluate the patient by the dialysis center. The patient was sent here because they were unable to cannulate the left arm AV graft this morning. Furthermore the Center states there is no thrill or bruit. The patient states this is the first dialysis run to be missed. This problem is acute in onset and has been present for approximately 2 days. The patient is unaware of any other change.  Patient denies pain or tenderness overlying the access.  There is no pain with dialysis.  The patient denies hand pain or finger pain consistent with steal syndrome.   There have not been any past interventions or declots of this access.  The patient is not chronically hypotensive on dialysis.  Current Facility-Administered Medications  Medication Dose Route Frequency Provider Last Rate Last Dose  . 0.9 %  sodium chloride infusion   Intravenous Continuous Katha Cabal, MD 20 mL/hr at 03/10/16 1241      Past Medical History:  Diagnosis Date  . Anemia   . Anxiety   . Chronic kidney disease    DIALYSIS  . Depression   . Diabetes mellitus without complication ([place])   . Dialysis patient ([place])    Mon, Wed, Fri  . History of chemotherapy    chemo on Tuesday and Wednesday  . Hypertension   . Multiple myeloma ([place])   . Neuropathy associated with cancer ([place])   . Osteoarthritis   . Pancreatitis   . Pneumonia    September 2017, [facility]  . Post-menopausal 2014  . Sepsis [facility])     Past Surgical History:  Procedure Laterality Date  . AV FISTULA PLACEMENT Left 02/20/2016   Procedure: INSERTION OF ARTERIOVENOUS (AV) GORE-TEX GRAFT ARM;  Surgeon: Katha Cabal, MD;  Location: [facility] ORS;  Service: Vascular;  Laterality: Left;  . CESAREAN SECTION     x2   . dialysis catheter placement    . PERIPHERAL VASCULAR CATHETERIZATION N/A 11/19/2015   Procedure: Dialysis/Perma Catheter Insertion;  Surgeon: Algernon Huxley, MD;  Location: [place] [facility];  Service: Cardiovascular;  Laterality: N/A;  . PORTACATH PLACEMENT    . TUBAL LIGATION      Social History Social History  Substance Use Topics  . Smoking status: Never Smoker  . Smokeless tobacco: Never Used  . Alcohol use No    Family History Family History  Problem Relation Age of Onset  . Hypercholesterolemia Mother   . Hypertension Mother   . Hypertension Father     No family history of bleeding or clotting disorders, autoimmune disease or porphyria  No Known Allergies   REVIEW OF SYSTEMS (Negative unless checked)  Constitutional: [] Weight loss  [] Fever  [] Chills Cardiac: [] Chest pain   [] Chest pressure   [] Palpitations   [] Shortness of breath when laying flat   [] Shortness of breath at rest   [x] Shortness of breath with exertion. Vascular:  [] Pain in legs with walking   [] Pain in legs at rest   [] Pain in legs when laying flat   [] Claudication   [] Pain in feet when walking  [] Pain in feet at rest  [] Pain in feet when laying flat   [] History of DVT   [] Phlebitis   [] Swelling in legs   [] Varicose veins   [] Non-healing ulcers Pulmonary:   []   Uses home oxygen   [] Productive cough   [] Hemoptysis   [] Wheeze  [] COPD   [] Asthma Neurologic:  [] Dizziness  [] Blackouts   [] Seizures   [] History of stroke   [] History of TIA  [] Aphasia   [] Temporary blindness   [] Dysphagia   [] Weakness or numbness in arms   [] Weakness or numbness in legs Musculoskeletal:  [] Arthritis   [] Joint swelling   [] Joint pain   [] Low back pain Hematologic:  [] Easy bruising  [] Easy bleeding   [] Hypercoagulable state   [] Anemic  [] Hepatitis Gastrointestinal:  [] Blood in stool   [] Vomiting blood  [] Gastroesophageal reflux/heartburn   [] Difficulty swallowing. Genitourinary:  [x] Chronic kidney disease   [] Difficult urination   [] Frequent urination  [] Burning with urination   [] Blood in urine Skin:  [] Rashes   [] Ulcers   [] Wounds Psychological:  [] History of anxiety   []  History of major depression.  Physical Examination  Vitals:   03/10/16 1237  BP: 128/63  Pulse: 88  Resp: 18  Temp: 98.2 F (36.8 C)  TempSrc: Oral  SpO2: 97%   There is no height or weight on file to calculate BMI. Gen: WD/WN, NAD Head: [place]/AT, No temporalis wasting. Prominent temp pulse not noted. Ear/Nose/Throat: Hearing grossly intact, nares w/o erythema or drainage, oropharynx w/o Erythema/Exudate,  Eyes: Conjunctiva clear, sclera non-icteric Neck: Trachea midline.  No JVD.  Pulmonary:  Good air movement, respirations not labored, no use of accessory muscles.  Cardiac: RRR, normal S1, S2. Vascular: Left brachial axillary graft no thrill no bruit skin integrity is good no evidence of ulceration or cellulitis incisions are well-healed Vessel Right Left  Radial Palpable Palpable  Ulnar Not Palpable Not Palpable  Brachial Palpable Palpable  Carotid Palpable, without bruit Palpable, without bruit  Gastrointestinal: soft, non-tender/non-distended. No guarding/reflex.  Musculoskeletal: M/S 5/5 throughout.  Extremities without ischemic changes.  No deformity or atrophy.  Neurologic: Sensation grossly intact in extremities.  Symmetrical.  Speech is fluent. Motor exam as listed above. Psychiatric: Judgment intact, Mood & affect appropriate for pt's clinical situation. Dermatologic: No rashes or ulcers noted.  No cellulitis or open wounds. Lymph : No Cervical, Axillary, or Inguinal lymphadenopathy.   CBC Lab Results  Component Value Date   WBC 6.9 03/09/2016   HGB 8.0 (L) 03/09/2016   HCT 24.0 (L) 03/09/2016   MCV 93.6 03/09/2016   PLT 164 03/09/2016    BMET    Component Value Date/Time   NA 133 (L) 03/09/2016 0848   NA 137 06/27/2014 1344   K 4.0 03/09/2016 0848   K 4.0 06/27/2014 1344   CL 97 (L) 03/09/2016 0848   CL 106  06/27/2014 1344   CO2 27 03/09/2016 0848   CO2 26 06/27/2014 1344   GLUCOSE 165 (H) 03/09/2016 0848   GLUCOSE 83 06/27/2014 1344   BUN 38 (H) 03/09/2016 0848   BUN 27 (H) 06/27/2014 1344   CREATININE 3.58 (H) 03/09/2016 0848   CREATININE 1.36 (H) 06/27/2014 1344   CALCIUM 9.2 03/09/2016 0848   CALCIUM 9.1 06/27/2014 1344   GFRNONAA 14 (L) 03/09/2016 0848   GFRNONAA 46 (L) 06/27/2014 1344   GFRAA 16 (L) 03/09/2016 0848   GFRAA 53 (L) 06/27/2014 1344   Estimated Creatinine Clearance: 13.4 mL/min (by C-G formula based on SCr of 3.58 mg/dL (H)).  COAG Lab Results  Component Value Date   INR 1.27 02/17/2016   INR 1.51 01/14/2016    Radiology No results found.  Assessment/Plan 1.  Complication dialysis device with thrombosis AV access:  Patient's  Left brachial axillary dialysis access is thrombosed. The patient will undergo thrombectomy using interventional techniques.  The risks and benefits were described to the patient.  All questions were answered.  The patient agrees to proceed with angiography and intervention. Potassium will be drawn to ensure that it is an appropriate level prior to performing thrombectomy. 2.  End-stage renal disease requiring hemodialysis:  Patient will continue dialysis therapy without further interruption if a successful thrombectomy is not achieved then catheter will be placed. Dialysis has already been arranged since the patient missed their previous session 3.  Hypertension:  Patient will continue medical management; nephrology is following no changes in oral medications. 4. Diabetes mellitus:  Glucose will be monitored and oral medications been held this morning once the patient has undergone the patient's procedure po intake will be reinitiated and again Accu-Cheks will be used to assess the blood glucose level and treat as needed. The patient will be restarted on the patient's usual hypoglycemic regime 5.  Multiple myeloma:  Patient will continue  chemotherapy as scheduled without interruption. Right IJ Infuse-a-Port is intact no further interventions at this time    Hortencia Pilar, MD  03/10/2016 1:33 PM

## 2016-03-10 NOTE — Progress Notes (Signed)
Patient's port de accessed. Tolerated well. MD here to see patient using interpreter. Patient and son verbalized their understanding. Patient taken to medical mall for discharge to home.

## 2016-03-11 ENCOUNTER — Encounter: Payer: Self-pay | Admitting: Vascular Surgery

## 2016-03-12 ENCOUNTER — Other Ambulatory Visit: Payer: Self-pay | Admitting: *Deleted

## 2016-03-12 DIAGNOSIS — C9002 Multiple myeloma in relapse: Secondary | ICD-10-CM

## 2016-03-12 MED ORDER — CITALOPRAM HYDROBROMIDE 20 MG PO TABS: 20.0000 mg | ORAL_TABLET | Freq: Every day | ORAL | 5 refills | Status: DC

## 2016-03-15 NOTE — Progress Notes (Signed)
[facility]  [phone] Fax:([phone]) [phone]  ID: Jamie Keller OB: May 05, 1965  MR#: [id number]  BSJ#:[id number]  [name], MD as PCP - General (Family Medicine)  CHIEF COMPLAINT: Multiple Myeloma in relapse with multiple bony lytic lesions, now with end-stage renal disease on dialysis.   INTERVAL HISTORY: Patient returns to clinic today for further evaluation and consideration of cycle 3, day 15 of Kyporlis. She continues to feel weak and fatigued between treatments but states this has improved. Her main complaint today is of a pruritic skin. She continues to have a good appetite. She has no neurologic complaints. She continues to have chronic back pain. She denies chest pain or shortness of breath.  She complains of mild constipation and gas pain between treatments. She has no urinary complaints. Patient offers no further specific complaints today.  REVIEW OF SYSTEMS:   Review of Systems  Constitutional: Positive for malaise/fatigue. Negative for fever and weight loss.  Respiratory: Negative for cough and shortness of breath.   Cardiovascular: Negative.  Negative for chest pain and leg swelling.  Gastrointestinal: Positive for constipation and nausea. Negative for abdominal pain and vomiting.  Musculoskeletal: Positive for back pain.  Skin: Positive for itching. Negative for rash.  Neurological: Positive for weakness. Negative for focal weakness.  Psychiatric/Behavioral: Negative for depression. The patient is not nervous/anxious.    As per HPI. Otherwise, a complete review of systems is negative.   PAST MEDICAL HISTORY: Past Medical History:  Diagnosis Date  . Anemia   . Anxiety   . Chronic kidney disease    DIALYSIS  . Depression   . Diabetes mellitus without complication ([place])   . Dialysis patient ([place])    Mon, Wed, Fri  . History of chemotherapy    chemo on Tuesday and Wednesday  . Hypertension   .  Multiple myeloma ([place])   . Neuropathy associated with cancer ([place])   . Osteoarthritis   . Pancreatitis   . Pneumonia    September 2017, [facility]  . Post-menopausal 2014  . Sepsis ([place])     PAST SURGICAL HISTORY: Past Surgical History:  Procedure Laterality Date  . AV FISTULA PLACEMENT Left 02/20/2016   Procedure: INSERTION OF ARTERIOVENOUS (AV) GORE-TEX GRAFT ARM;  Surgeon: Katha Cabal, MD;  Location: [facility] ORS;  Service: Vascular;  Laterality: Left;  . CESAREAN SECTION     x2  . dialysis catheter placement    . PERIPHERAL VASCULAR CATHETERIZATION N/A 11/19/2015   Procedure: Dialysis/Perma Catheter Insertion;  Surgeon: Algernon Huxley, MD;  Location: [place] [facility];  Service: Cardiovascular;  Laterality: N/A;  . PERIPHERAL VASCULAR CATHETERIZATION Left 03/10/2016   Procedure: Thrombectomy;  Surgeon: Katha Cabal, MD;  Location: [place] [facility];  Service: Cardiovascular;  Laterality: Left;  . PORTACATH PLACEMENT    . TUBAL LIGATION      FAMILY HISTORY: Reviewed and unchanged. No reported history of malignancy or chronic disease.     ADVANCED DIRECTIVES:    HEALTH MAINTENANCE: Social History  Substance Use Topics  . Smoking status: Never Smoker  . Smokeless tobacco: Never Used  . Alcohol use No     No Known Allergies  Current Outpatient Prescriptions  Medication Sig Dispense Refill  . acetaminophen (TYLENOL) 500 MG tablet Take 500 mg by mouth every 6 (six) hours as needed.    . citalopram (CELEXA) 20 MG tablet Take 1 tablet (20 mg total) by mouth daily. 30 tablet 5  . HYDROcodone-acetaminophen ([place])  5-325 MG tablet Take 1-2 tablets by mouth every 6 (six) hours as needed for moderate pain or severe pain. 50 tablet 0  . Insulin Detemir (LEVEMIR FLEXPEN) 100 UNIT/ML Pen Inject 5 Units into the skin daily at 10 pm.    . VERAPAMIL HCL ER PO Take 120 mg by mouth daily.    Marland Kitchen dronabinol (MARINOL) 2.5 MG capsule Take 1 capsule (2.5 mg total) by mouth 2 (two) times  daily before a meal. (Patient not taking: Reported on 03/16/2016) 60 capsule 0  . ferrous sulfate 325 (65 FE) MG tablet Take 325 mg by mouth daily with breakfast.    . furosemide (LASIX) 80 MG tablet Take 1 tablet (80 mg total) by mouth daily. (Patient not taking: Reported on 03/16/2016) 30 tablet 0  . promethazine (PHENERGAN) 25 MG tablet Take 25 mg by mouth every 6 (six) hours as needed for nausea or vomiting.     No current facility-administered medications for this visit.    Facility-Administered Medications Ordered in Other Visits  Medication Dose Route Frequency Provider Last Rate Last Dose  . heparin lock flush 100 unit/mL  500 Units Intravenous Once Lloyd Huger, MD      . sodium chloride flush (NS) 0.9 % injection 10 mL  10 mL Intravenous Once Lloyd Huger, MD        OBJECTIVE: Vitals:   03/16/16 0937  BP: (!) 144/69  Pulse: 98  Resp: 18  Temp: 97.5 F (36.4 C)     Body mass index is 19.33 kg/m.    ECOG FS:1 - Symptomatic  General: No acute distress. Eyes: anicteric sclera. Lungs: Clear to auscultation bilaterally. Heart: Regular rate and rhythm. No rubs, murmurs, or gallops. Abdomen: Soft, nontender, nondistended. No organomegaly noted, normoactive bowel sounds. Musculoskeletal: No edema, cyanosis, or clubbing. Neuro: Alert, answering all questions appropriately. Cranial nerves grossly intact. Skin: No rashes or petechiae noted. Psych: Normal affect.   LAB RESULTS:  Lab Results  Component Value Date   NA 135 03/16/2016   K 3.9 03/16/2016   CL 97 (L) 03/16/2016   CO2 27 03/16/2016   GLUCOSE 166 (H) 03/16/2016   BUN 25 (H) 03/16/2016   CREATININE 3.31 (H) 03/16/2016   CALCIUM 9.2 03/16/2016   PROT 6.2 (L) 03/16/2016   ALBUMIN 2.7 (L) 03/16/2016   AST 29 03/16/2016   ALT 10 (L) 03/16/2016   ALKPHOS 167 (H) 03/16/2016   BILITOT 1.3 (H) 03/16/2016   GFRNONAA 15 (L) 03/16/2016   GFRAA 17 (L) 03/16/2016    Lab Results  Component Value Date   WBC  6.0 03/16/2016   NEUTROABS 4.0 03/16/2016   HGB 9.2 (L) 03/16/2016   HCT 27.6 (L) 03/16/2016   MCV 93.6 03/16/2016   PLT 136 (L) 03/16/2016   Lab Results  Component Value Date   TOTALPROTELP 5.4 (L) 03/09/2016   ALBUMINELP 2.8 (L) 03/09/2016   A1GS 0.3 03/09/2016   A2GS 0.8 03/09/2016   BETS 0.9 03/09/2016   GAMS 0.4 03/09/2016   MSPIKE 0.4 (H) 03/09/2016   SPEI Comment 03/09/2016     STUDIES: No results found.  ASSESSMENT: Multiple Myeloma in relapse with multiple bony lytic lesions, end-stage renal disease.   PLAN:    1. Multiple Myeloma in relapse with multiple bony lytic lesions: MRI, CT, bone scan results reviewed independently indicating progression of disease. Patient also has progressed to end-stage renal disease, likely secondary to underlying myeloma. Patient's kappa free light chain hasTrended down slightly, although her ratio has  trended up. M spike has trended down to 0.4. Proceed with cycle 3, day 15 of Kyporlis. Given patient's decreased performance status, will not increase dose of Kyporlis. Patient will receive treatment on days 1, 2, 8, 9, 15, and 16. Kyporlis does not need to be dose reduced in the setting of end-stage renal disease and dialysis. Can consider Delton See given her renal failure. Dexamethasone has been discontinued given her difficult to control blood sugar. Because of patient's immigration status, she could not undergo transplant. Return to clinic tomorrow for treatment and in 2 weeks for consideration of cycle 4, day 1. 2. Back pain: Pain persists. Continue current narcotics as prescribed. 3. End-stage renal disease: Likely secondary to progression of disease. Continue dialysis on Monday, Wednesdays and Fridays. Has fistula. 4. Anemia: Hemoglobin decreased, but slightly improved. Patient does not require transfusion at this time.  5. Hyperglycemia: Patient's blood glucose has improved since initiating insulin. Continue monitoring and treatment by primary  care. Continue to hold dexamethasone as above. 6. Thrombocytopenia: Multifactorial secondary to treatment as well as disease progression. Improved. 7. Depression: Continue Celexa 20 mg daily. 8. Weight Loss: Stable without medication. Patient stopped taking Marinol due to making her "feel funny". She states that she has an improved appetite without the medication. Will continue to monitor. 9. Constipation: Encouraged OTC medications. Monitor.  10. Pruritus: Patient states Benadryl works, but only for several hours. She is given a prescription for Atarax today.  The entire visit was done in the presence of an interpreter.  Patient expressed understanding and was in agreement with this plan. She also understands that She can call clinic at any time with any questions, concerns, or complaints.    Lloyd Huger, MD 03/16/16 9:43 AM

## 2016-03-16 ENCOUNTER — Inpatient Hospital Stay (HOSPITAL_BASED_OUTPATIENT_CLINIC_OR_DEPARTMENT_OTHER): Payer: Self-pay | Admitting: Oncology

## 2016-03-16 ENCOUNTER — Inpatient Hospital Stay: Payer: Self-pay

## 2016-03-16 VITALS — BP 144/69 | HR 98 | Temp 97.5°F | Resp 18 | Wt 99.0 lb

## 2016-03-16 DIAGNOSIS — I129 Hypertensive chronic kidney disease with stage 1 through stage 4 chronic kidney disease, or unspecified chronic kidney disease: Secondary | ICD-10-CM

## 2016-03-16 DIAGNOSIS — K5900 Constipation, unspecified: Secondary | ICD-10-CM

## 2016-03-16 DIAGNOSIS — Z8619 Personal history of other infectious and parasitic diseases: Secondary | ICD-10-CM

## 2016-03-16 DIAGNOSIS — R531 Weakness: Secondary | ICD-10-CM

## 2016-03-16 DIAGNOSIS — L299 Pruritus, unspecified: Secondary | ICD-10-CM

## 2016-03-16 DIAGNOSIS — R140 Abdominal distension (gaseous): Secondary | ICD-10-CM

## 2016-03-16 DIAGNOSIS — N186 End stage renal disease: Secondary | ICD-10-CM

## 2016-03-16 DIAGNOSIS — G629 Polyneuropathy, unspecified: Secondary | ICD-10-CM

## 2016-03-16 DIAGNOSIS — Z8701 Personal history of pneumonia (recurrent): Secondary | ICD-10-CM

## 2016-03-16 DIAGNOSIS — M1990 Unspecified osteoarthritis, unspecified site: Secondary | ICD-10-CM

## 2016-03-16 DIAGNOSIS — R112 Nausea with vomiting, unspecified: Secondary | ICD-10-CM

## 2016-03-16 DIAGNOSIS — E119 Type 2 diabetes mellitus without complications: Secondary | ICD-10-CM

## 2016-03-16 DIAGNOSIS — Z794 Long term (current) use of insulin: Secondary | ICD-10-CM

## 2016-03-16 DIAGNOSIS — M549 Dorsalgia, unspecified: Secondary | ICD-10-CM

## 2016-03-16 DIAGNOSIS — D649 Anemia, unspecified: Secondary | ICD-10-CM

## 2016-03-16 DIAGNOSIS — C9002 Multiple myeloma in relapse: Secondary | ICD-10-CM

## 2016-03-16 DIAGNOSIS — F329 Major depressive disorder, single episode, unspecified: Secondary | ICD-10-CM

## 2016-03-16 DIAGNOSIS — R634 Abnormal weight loss: Secondary | ICD-10-CM

## 2016-03-16 DIAGNOSIS — F419 Anxiety disorder, unspecified: Secondary | ICD-10-CM

## 2016-03-16 DIAGNOSIS — R5383 Other fatigue: Secondary | ICD-10-CM

## 2016-03-16 DIAGNOSIS — Z992 Dependence on renal dialysis: Secondary | ICD-10-CM

## 2016-03-16 DIAGNOSIS — Z79899 Other long term (current) drug therapy: Secondary | ICD-10-CM

## 2016-03-16 DIAGNOSIS — D696 Thrombocytopenia, unspecified: Secondary | ICD-10-CM

## 2016-03-16 LAB — COMPREHENSIVE METABOLIC PANEL
ALBUMIN: 2.7 g/dL — AB (ref 3.5–5.0)
ALT: 10 U/L — AB (ref 14–54)
ANION GAP: 11 (ref 5–15)
AST: 29 U/L (ref 15–41)
Alkaline Phosphatase: 167 U/L — ABNORMAL HIGH (ref 38–126)
BUN: 25 mg/dL — ABNORMAL HIGH (ref 6–20)
CHLORIDE: 97 mmol/L — AB (ref 101–111)
CO2: 27 mmol/L (ref 22–32)
Calcium: 9.2 mg/dL (ref 8.9–10.3)
Creatinine, Ser: 3.31 mg/dL — ABNORMAL HIGH (ref 0.44–1.00)
GFR calc non Af Amer: 15 mL/min — ABNORMAL LOW (ref >60)
GFR, EST AFRICAN AMERICAN: 17 mL/min — AB (ref >60)
GLUCOSE: 166 mg/dL — AB (ref 65–99)
Potassium: 3.9 mmol/L (ref 3.5–5.1)
SODIUM: 135 mmol/L (ref 135–145)
Total Bilirubin: 1.3 mg/dL — ABNORMAL HIGH (ref 0.3–1.2)
Total Protein: 6.2 g/dL — ABNORMAL LOW (ref 6.5–8.1)

## 2016-03-16 LAB — CBC WITH DIFFERENTIAL/PLATELET
BASOS PCT: 1 %
Basophils Absolute: 0.1 10*3/uL (ref 0–0.1)
EOS PCT: 1 %
Eosinophils Absolute: 0.1 10*3/uL (ref 0–0.7)
HCT: 27.6 % — ABNORMAL LOW (ref 35.0–47.0)
Hemoglobin: 9.2 g/dL — ABNORMAL LOW (ref 12.0–16.0)
LYMPHS ABS: 0.7 10*3/uL — AB (ref 1.0–3.6)
Lymphocytes Relative: 12 %
MCH: 31.1 pg (ref 26.0–34.0)
MCHC: 33.2 g/dL (ref 32.0–36.0)
MCV: 93.6 fL (ref 80.0–100.0)
Monocytes Absolute: 1.1 10*3/uL — ABNORMAL HIGH (ref 0.2–0.9)
Monocytes Relative: 18 %
NEUTROS PCT: 68 %
Neutro Abs: 4 10*3/uL (ref 1.4–6.5)
PLATELETS: 136 10*3/uL — AB (ref 150–440)
RBC: 2.95 MIL/uL — AB (ref 3.80–5.20)
RDW: 22.2 % — ABNORMAL HIGH (ref 11.5–14.5)
WBC: 6 10*3/uL (ref 3.6–11.0)

## 2016-03-16 MED ORDER — SODIUM CHLORIDE 0.9% FLUSH
10.0000 mL | Freq: Once | INTRAVENOUS | Status: AC
  Administered 2016-03-16: 10 mL via INTRAVENOUS
  Filled 2016-03-16: qty 10

## 2016-03-16 MED ORDER — SODIUM CHLORIDE 0.9 % IV SOLN
Freq: Once | INTRAVENOUS | Status: AC
  Administered 2016-03-16: 11:00:00 via INTRAVENOUS
  Filled 2016-03-16: qty 1000

## 2016-03-16 MED ORDER — DEXTROSE 5 % IV SOLN
20.0000 mg/m2 | Freq: Once | INTRAVENOUS | Status: AC
  Administered 2016-03-16: 30 mg via INTRAVENOUS
  Filled 2016-03-16: qty 15

## 2016-03-16 MED ORDER — SODIUM CHLORIDE 0.9 % IV SOLN: Freq: Once | INTRAVENOUS | Status: DC

## 2016-03-16 MED ORDER — PROCHLORPERAZINE MALEATE 10 MG PO TABS
10.0000 mg | ORAL_TABLET | Freq: Once | ORAL | Status: AC
  Administered 2016-03-16: 10 mg via ORAL
  Filled 2016-03-16: qty 1

## 2016-03-16 MED ORDER — HYDROXYZINE HCL 10 MG PO TABS: 10.0000 mg | ORAL_TABLET | Freq: Three times a day (TID) | ORAL | 1 refills | Status: DC | PRN

## 2016-03-16 MED ORDER — HEPARIN SOD (PORK) LOCK FLUSH 100 UNIT/ML IV SOLN
500.0000 [IU] | Freq: Once | INTRAVENOUS | Status: AC
  Administered 2016-03-16: 500 [IU] via INTRAVENOUS
  Filled 2016-03-16: qty 5

## 2016-03-16 MED ORDER — SODIUM CHLORIDE 0.9 % IV SOLN
Freq: Once | INTRAVENOUS | Status: AC
  Administered 2016-03-16: 12:00:00 via INTRAVENOUS
  Filled 2016-03-16: qty 1000

## 2016-03-16 MED ORDER — ONDANSETRON 8 MG PO TBDP
8.0000 mg | ORAL_TABLET | Freq: Once | ORAL | Status: AC
  Administered 2016-03-16: 8 mg via ORAL
  Filled 2016-03-16: qty 1

## 2016-03-16 NOTE — Progress Notes (Signed)
Patient is here for follow up, she is doing ok. She does mention she does have itching. She feels as if she is stable with disease but not progressing to the better. She does have pain In her bones that radiates down her legs and feels like she has ants on her.

## 2016-03-17 ENCOUNTER — Inpatient Hospital Stay: Payer: Self-pay

## 2016-03-17 ENCOUNTER — Emergency Department
Admission: EM | Admit: 2016-03-17 | Discharge: 2016-03-17 | Disposition: A | Discharge status: Home / Self Care | Payer: Self-pay | Attending: Emergency Medicine | Admitting: Emergency Medicine

## 2016-03-17 ENCOUNTER — Encounter: Payer: Self-pay | Admitting: Emergency Medicine

## 2016-03-17 VITALS — BP 145/70 | HR 98 | Temp 97.5°F | Resp 18

## 2016-03-17 DIAGNOSIS — E1122 Type 2 diabetes mellitus with diabetic chronic kidney disease: Secondary | ICD-10-CM | POA: Insufficient documentation

## 2016-03-17 DIAGNOSIS — Z794 Long term (current) use of insulin: Secondary | ICD-10-CM | POA: Insufficient documentation

## 2016-03-17 DIAGNOSIS — N189 Chronic kidney disease, unspecified: Secondary | ICD-10-CM | POA: Insufficient documentation

## 2016-03-17 DIAGNOSIS — C9002 Multiple myeloma in relapse: Secondary | ICD-10-CM

## 2016-03-17 DIAGNOSIS — R509 Fever, unspecified: Secondary | ICD-10-CM | POA: Insufficient documentation

## 2016-03-17 DIAGNOSIS — Z5321 Procedure and treatment not carried out due to patient leaving prior to being seen by health care provider: Secondary | ICD-10-CM | POA: Insufficient documentation

## 2016-03-17 DIAGNOSIS — Z992 Dependence on renal dialysis: Secondary | ICD-10-CM | POA: Insufficient documentation

## 2016-03-17 DIAGNOSIS — I129 Hypertensive chronic kidney disease with stage 1 through stage 4 chronic kidney disease, or unspecified chronic kidney disease: Secondary | ICD-10-CM | POA: Insufficient documentation

## 2016-03-17 LAB — PROTEIN ELECTROPHORESIS, SERUM
A/G Ratio: 0.9 (ref 0.7–1.7)
ALPHA-2-GLOBULIN: 0.9 g/dL (ref 0.4–1.0)
Albumin ELP: 2.8 g/dL — ABNORMAL LOW (ref 2.9–4.4)
Alpha-1-Globulin: 0.4 g/dL (ref 0.0–0.4)
Beta Globulin: 1.2 g/dL (ref 0.7–1.3)
GLOBULIN, TOTAL: 3 g/dL (ref 2.2–3.9)
Gamma Globulin: 0.5 g/dL (ref 0.4–1.8)
M-SPIKE, %: 0.4 g/dL — AB
PDF: 0
Total Protein ELP: 5.8 g/dL — ABNORMAL LOW (ref 6.0–8.5)

## 2016-03-17 LAB — KAPPA/LAMBDA LIGHT CHAINS
KAPPA, LAMDA LIGHT CHAIN RATIO: 1068.61 — AB (ref 0.26–1.65)
Kappa free light chain: 18380.1 mg/L — ABNORMAL HIGH (ref 3.3–19.4)
Lambda free light chains: 17.2 mg/L (ref 5.7–26.3)

## 2016-03-17 MED ORDER — DEXTROSE 5 % IV SOLN
20.0000 mg/m2 | Freq: Once | INTRAVENOUS | Status: AC
  Administered 2016-03-17: 30 mg via INTRAVENOUS
  Filled 2016-03-17: qty 15

## 2016-03-17 MED ORDER — SODIUM CHLORIDE 0.9 % IV SOLN: Freq: Once | INTRAVENOUS | Status: DC

## 2016-03-17 MED ORDER — PROCHLORPERAZINE MALEATE 10 MG PO TABS
10.0000 mg | ORAL_TABLET | Freq: Once | ORAL | Status: AC
  Administered 2016-03-17: 10 mg via ORAL
  Filled 2016-03-17: qty 1

## 2016-03-17 MED ORDER — SODIUM CHLORIDE 0.9% FLUSH
10.0000 mL | INTRAVENOUS | Status: DC | PRN
  Administered 2016-03-17: 10 mL
  Filled 2016-03-17: qty 10

## 2016-03-17 MED ORDER — SODIUM CHLORIDE 0.9 % IV SOLN
Freq: Once | INTRAVENOUS | Status: AC
  Administered 2016-03-17: 09:00:00 via INTRAVENOUS
  Filled 2016-03-17: qty 1000

## 2016-03-17 MED ORDER — ONDANSETRON 8 MG PO TBDP
8.0000 mg | ORAL_TABLET | Freq: Once | ORAL | Status: AC
  Administered 2016-03-17: 8 mg via ORAL
  Filled 2016-03-17: qty 1

## 2016-03-17 MED ORDER — HEPARIN SOD (PORK) LOCK FLUSH 100 UNIT/ML IV SOLN
500.0000 [IU] | Freq: Once | INTRAVENOUS | Status: AC | PRN
  Administered 2016-03-17: 500 [IU]
  Filled 2016-03-17: qty 5

## 2016-03-17 MED ORDER — HYDROCODONE-ACETAMINOPHEN 5-325 MG PO TABS: 1.0000 | ORAL_TABLET | Freq: Four times a day (QID) | ORAL | 0 refills | Status: DC | PRN

## 2016-03-17 NOTE — ED Triage Notes (Signed)
Pt ambulatory to triage with steady gait with c/o fever today after dialysis. Pt reports "I don't know if its because I was bundled up during dialysis but my temp was 100." Pt receives dialysis on MWF, and chemotherapy on Tuesday and Wednesday due to multiple myeloma. Pt is afebrile in triage.

## 2016-03-18 ENCOUNTER — Encounter (INDEPENDENT_AMBULATORY_CARE_PROVIDER_SITE_OTHER): Payer: Self-pay

## 2016-03-18 ENCOUNTER — Ambulatory Visit: Payer: Self-pay

## 2016-03-18 ENCOUNTER — Other Ambulatory Visit (INDEPENDENT_AMBULATORY_CARE_PROVIDER_SITE_OTHER): Payer: Self-pay | Admitting: Vascular Surgery

## 2016-03-19 ENCOUNTER — Other Ambulatory Visit: Payer: Self-pay | Admitting: *Deleted

## 2016-03-19 NOTE — Telephone Encounter (Signed)
Jamie Keller talked to her. Pt to call back if she cannot find the script we gave her.

## 2016-03-19 NOTE — Telephone Encounter (Signed)
I show that she is on Hydrocodone and just had it refilled 2 days ago. Please advise

## 2016-03-22 ENCOUNTER — Encounter: Admission: RE | Payer: Self-pay | Source: Ambulatory Visit

## 2016-03-22 ENCOUNTER — Ambulatory Visit: Admission: RE | Admit: 2016-03-22 | Payer: Self-pay | Source: Ambulatory Visit | Admitting: Vascular Surgery

## 2016-03-22 SURGERY — DIALYSIS/PERMA CATHETER REMOVAL
Anesthesia: Moderate Sedation

## 2016-03-29 ENCOUNTER — Other Ambulatory Visit: Payer: Self-pay | Admitting: Oncology

## 2016-03-29 ENCOUNTER — Other Ambulatory Visit (INDEPENDENT_AMBULATORY_CARE_PROVIDER_SITE_OTHER): Payer: Self-pay | Admitting: Vascular Surgery

## 2016-03-29 NOTE — Progress Notes (Signed)
[place]  [phone] [phone]  ID: [name] OB: [birth year]  MR#: [id number]  KPT#:[id number]  [name], MD as PCP - General (Family Medicine)  CHIEF COMPLAINT: Multiple Myeloma in relapse with multiple bony lytic lesions, now with end-stage renal disease on dialysis.   INTERVAL HISTORY: Patient returns to clinic today for further evaluation and consideration of cycle 4, day 1 of Kyporlis. She continues to feel weak and fatigued, and feels that is slightly worse than previous. She does not complain of pruritic skin today. She continues to have a good appetite. She has no neurologic complaints. She continues to have chronic back pain. She denies chest pain or shortness of breath.  She complains of mild constipation and gas pain between treatments. She has no urinary complaints. Patient offers no further specific complaints today.  REVIEW OF SYSTEMS:   Review of Systems  Constitutional: Positive for malaise/fatigue. Negative for fever and weight loss.  Respiratory: Negative for cough and shortness of breath.   Cardiovascular: Negative.  Negative for chest pain and leg swelling.  Gastrointestinal: Positive for constipation and nausea. Negative for abdominal pain and vomiting.  Musculoskeletal: Positive for back pain.  Skin: Positive for itching. Negative for rash.  Neurological: Positive for weakness. Negative for focal weakness.  Psychiatric/Behavioral: Negative for depression. The patient is not nervous/anxious.    As per HPI. Otherwise, a complete review of systems is negative.   PAST MEDICAL HISTORY: Past Medical History:  Diagnosis Date  . Anemia   . Anxiety   . Chronic kidney disease    DIALYSIS  . Depression   . Diabetes mellitus without complication ([place])   . Dialysis patient ([place])    Mon, Wed, Fri  . History of chemotherapy    chemo on Tuesday and Wednesday  . Hypertension   .  Multiple myeloma ([place])   . Neuropathy associated with cancer ([place])   . Osteoarthritis   . Pancreatitis   . Pneumonia    September 2017, [facility]  . Post-menopausal 2014  . Sepsis ([place])     PAST SURGICAL HISTORY: Past Surgical History:  Procedure Laterality Date  . AV FISTULA PLACEMENT Left 02/20/2016   Procedure: INSERTION OF ARTERIOVENOUS (AV) GORE-TEX GRAFT ARM;  Surgeon: Katha Cabal, MD;  Location: [facility] ORS;  Service: Vascular;  Laterality: Left;  . CESAREAN SECTION     x2  . dialysis catheter placement    . PERIPHERAL VASCULAR CATHETERIZATION N/A 11/19/2015   Procedure: Dialysis/Perma Catheter Insertion;  Surgeon: Algernon Huxley, MD;  Location: [place] [facility];  Service: Cardiovascular;  Laterality: N/A;  . PERIPHERAL VASCULAR CATHETERIZATION Left 03/10/2016   Procedure: Thrombectomy;  Surgeon: Katha Cabal, MD;  Location: [place] [facility];  Service: Cardiovascular;  Laterality: Left;  . PORTACATH PLACEMENT    . TUBAL LIGATION      FAMILY HISTORY: Reviewed and unchanged. No reported history of malignancy or chronic disease.     ADVANCED DIRECTIVES:    HEALTH MAINTENANCE: Social History  Substance Use Topics  . Smoking status: Never Smoker  . Smokeless tobacco: Never Used  . Alcohol use No     No Known Allergies  Current Outpatient Prescriptions  Medication Sig Dispense Refill  . acetaminophen (TYLENOL) 500 MG tablet Take 500 mg by mouth every 6 (six) hours as needed.    . citalopram (CELEXA) 20 MG tablet Take 1 tablet (20 mg total) by mouth daily. 30 tablet 5  . HYDROcodone-acetaminophen ([place])  5-325 MG tablet Take 1-2 tablets by mouth every 6 (six) hours as needed for moderate pain or severe pain. 50 tablet 0  . Insulin Detemir (LEVEMIR FLEXPEN) 100 UNIT/ML Pen Inject 5 Units into the skin daily at 10 pm.    . VERAPAMIL HCL ER PO Take 120 mg by mouth daily.    Marland Kitchen dronabinol (MARINOL) 2.5 MG capsule Take 1 capsule (2.5 mg total) by mouth 2 (two) times  daily before a meal. (Patient not taking: Reported on 03/16/2016) 60 capsule 0  . ferrous sulfate 325 (65 FE) MG tablet Take 325 mg by mouth daily with breakfast.    . furosemide (LASIX) 80 MG tablet Take 1 tablet (80 mg total) by mouth daily. (Patient not taking: Reported on 03/16/2016) 30 tablet 0  . promethazine (PHENERGAN) 25 MG tablet Take 25 mg by mouth every 6 (six) hours as needed for nausea or vomiting.     No current facility-administered medications for this visit.    Facility-Administered Medications Ordered in Other Visits  Medication Dose Route Frequency Provider Last Rate Last Dose  . heparin lock flush 100 unit/mL  500 Units Intravenous Once Lloyd Huger, MD      . sodium chloride flush (NS) 0.9 % injection 10 mL  10 mL Intravenous Once Lloyd Huger, MD        OBJECTIVE: Vitals:   03/16/16 0937  BP: (!) 144/69  Pulse: 98  Resp: 18  Temp: 97.5 F (36.4 C)     Body mass index is 19.33 kg/m.    ECOG FS:1 - Symptomatic  General: No acute distress. Eyes: anicteric sclera. Lungs: Clear to auscultation bilaterally. Heart: Regular rate and rhythm. No rubs, murmurs, or gallops. Abdomen: Soft, nontender, nondistended. No organomegaly noted, normoactive bowel sounds. Musculoskeletal: No edema, cyanosis, or clubbing. Neuro: Alert, answering all questions appropriately. Cranial nerves grossly intact. Skin: No rashes or petechiae noted. Psych: Normal affect.   LAB RESULTS:  Lab Results  Component Value Date   NA 135 03/16/2016   K 3.9 03/16/2016   CL 97 (L) 03/16/2016   CO2 27 03/16/2016   GLUCOSE 166 (H) 03/16/2016   BUN 25 (H) 03/16/2016   CREATININE 3.31 (H) 03/16/2016   CALCIUM 9.2 03/16/2016   PROT 6.2 (L) 03/16/2016   ALBUMIN 2.7 (L) 03/16/2016   AST 29 03/16/2016   ALT 10 (L) 03/16/2016   ALKPHOS 167 (H) 03/16/2016   BILITOT 1.3 (H) 03/16/2016   GFRNONAA 15 (L) 03/16/2016   GFRAA 17 (L) 03/16/2016    Lab Results  Component Value Date   WBC  6.0 03/16/2016   NEUTROABS 4.0 03/16/2016   HGB 9.2 (L) 03/16/2016   HCT 27.6 (L) 03/16/2016   MCV 93.6 03/16/2016   PLT 136 (L) 03/16/2016   Lab Results  Component Value Date   TOTALPROTELP 5.4 (L) 03/09/2016   ALBUMINELP 2.8 (L) 03/09/2016   A1GS 0.3 03/09/2016   A2GS 0.8 03/09/2016   BETS 0.9 03/09/2016   GAMS 0.4 03/09/2016   MSPIKE 0.4 (H) 03/09/2016   SPEI Comment 03/09/2016     STUDIES: No results found.  ASSESSMENT: Multiple Myeloma in relapse with multiple bony lytic lesions, end-stage renal disease.   PLAN:    1. Multiple Myeloma in relapse with multiple bony lytic lesions: MRI, CT, bone scan results reviewed independently indicating progression of disease. Patient also has progressed to end-stage renal disease, likely secondary to underlying myeloma. Patient's kappa free light chain has trended down slightly, although her ratio  has trended up. M spike has trended down to 0.4. Today's results are pending. Proceed with cycle 4, day 1 of Kyporlis. Given patient's decreased performance status, will not increase dose of Kyporlis. Patient will receive treatment on days 1, 2, 8, 9, 15, and 16. Kyporlis does not need to be dose reduced in the setting of end-stage renal disease and dialysis. Can consider Delton See given her renal failure. Dexamethasone has been discontinued given her difficult to control blood sugar. Because of patient's immigration status, she could not undergo transplant. Return to clinic tomorrow for treatment and in 1 week for consideration of cycle 4, day 8. 2. Back pain: Pain persists. Continue current narcotics as prescribed. 3. End-stage renal disease: Likely secondary to progression of disease. Continue dialysis on Monday, Wednesdays and Fridays. Has fistula. 4. Anemia: Hemoglobin decreased. Patient does not require transfusion at this time.  5. Hyperglycemia: Patient's blood glucose has improved since initiating insulin. Continue monitoring and treatment by  primary care. Continue to hold dexamethasone as above. 6. Thrombocytopenia: Multifactorial secondary to treatment as well as disease progression. Improved. 7. Depression: Continue Celexa 20 mg daily. 8. Weight Loss: Stable without medication. Patient stopped taking Marinol due to making her "feel funny". She states that she has an improved appetite without the medication. Will continue to monitor. 9. Constipation: Encouraged OTC medications. Monitor.  10. Pruritus: Patient states Benadryl works, but only for several hours. Continue Atarax as needed.  The entire visit was done in the presence of an interpreter.  Patient expressed understanding and was in agreement with this plan. She also understands that She can call clinic at any time with any questions, concerns, or complaints.    Lloyd Huger, MD 03/30/16 9:53 AM

## 2016-03-30 ENCOUNTER — Inpatient Hospital Stay: Payer: Self-pay

## 2016-03-30 ENCOUNTER — Inpatient Hospital Stay (HOSPITAL_BASED_OUTPATIENT_CLINIC_OR_DEPARTMENT_OTHER): Payer: Self-pay | Admitting: Oncology

## 2016-03-30 VITALS — BP 119/66 | HR 93 | Temp 97.6°F | Resp 18 | Wt 100.1 lb

## 2016-03-30 DIAGNOSIS — R5383 Other fatigue: Secondary | ICD-10-CM

## 2016-03-30 DIAGNOSIS — C9002 Multiple myeloma in relapse: Secondary | ICD-10-CM

## 2016-03-30 DIAGNOSIS — F419 Anxiety disorder, unspecified: Secondary | ICD-10-CM

## 2016-03-30 DIAGNOSIS — N186 End stage renal disease: Secondary | ICD-10-CM

## 2016-03-30 DIAGNOSIS — F329 Major depressive disorder, single episode, unspecified: Secondary | ICD-10-CM

## 2016-03-30 DIAGNOSIS — Z8701 Personal history of pneumonia (recurrent): Secondary | ICD-10-CM

## 2016-03-30 DIAGNOSIS — K5900 Constipation, unspecified: Secondary | ICD-10-CM

## 2016-03-30 DIAGNOSIS — Z79899 Other long term (current) drug therapy: Secondary | ICD-10-CM

## 2016-03-30 DIAGNOSIS — I129 Hypertensive chronic kidney disease with stage 1 through stage 4 chronic kidney disease, or unspecified chronic kidney disease: Secondary | ICD-10-CM

## 2016-03-30 DIAGNOSIS — D696 Thrombocytopenia, unspecified: Secondary | ICD-10-CM

## 2016-03-30 DIAGNOSIS — R531 Weakness: Secondary | ICD-10-CM

## 2016-03-30 DIAGNOSIS — Z794 Long term (current) use of insulin: Secondary | ICD-10-CM

## 2016-03-30 DIAGNOSIS — M1990 Unspecified osteoarthritis, unspecified site: Secondary | ICD-10-CM

## 2016-03-30 DIAGNOSIS — R112 Nausea with vomiting, unspecified: Secondary | ICD-10-CM

## 2016-03-30 DIAGNOSIS — M459 Ankylosing spondylitis of unspecified sites in spine: Secondary | ICD-10-CM

## 2016-03-30 DIAGNOSIS — D649 Anemia, unspecified: Secondary | ICD-10-CM

## 2016-03-30 DIAGNOSIS — G629 Polyneuropathy, unspecified: Secondary | ICD-10-CM

## 2016-03-30 DIAGNOSIS — Z992 Dependence on renal dialysis: Secondary | ICD-10-CM

## 2016-03-30 DIAGNOSIS — L299 Pruritus, unspecified: Secondary | ICD-10-CM

## 2016-03-30 DIAGNOSIS — E1165 Type 2 diabetes mellitus with hyperglycemia: Secondary | ICD-10-CM

## 2016-03-30 DIAGNOSIS — R634 Abnormal weight loss: Secondary | ICD-10-CM

## 2016-03-30 DIAGNOSIS — R140 Abdominal distension (gaseous): Secondary | ICD-10-CM

## 2016-03-30 DIAGNOSIS — Z8619 Personal history of other infectious and parasitic diseases: Secondary | ICD-10-CM

## 2016-03-30 LAB — CBC WITH DIFFERENTIAL/PLATELET
BASOS ABS: 0 10*3/uL (ref 0–0.1)
Basophils Relative: 1 %
Eosinophils Absolute: 0 10*3/uL (ref 0–0.7)
Eosinophils Relative: 0 %
HEMATOCRIT: 25.2 % — AB (ref 35.0–47.0)
Hemoglobin: 8.5 g/dL — ABNORMAL LOW (ref 12.0–16.0)
LYMPHS ABS: 0.5 10*3/uL — AB (ref 1.0–3.6)
Lymphocytes Relative: 13 %
MCH: 31.9 pg (ref 26.0–34.0)
MCHC: 33.6 g/dL (ref 32.0–36.0)
MCV: 94.8 fL (ref 80.0–100.0)
MONOS PCT: 20 %
Monocytes Absolute: 0.7 10*3/uL (ref 0.2–0.9)
NEUTROS ABS: 2.5 10*3/uL (ref 1.4–6.5)
Neutrophils Relative %: 66 %
PLATELETS: 130 10*3/uL — AB (ref 150–440)
RBC: 2.66 MIL/uL — AB (ref 3.80–5.20)
RDW: 22.5 % — AB (ref 11.5–14.5)
WBC: 3.7 10*3/uL (ref 3.6–11.0)

## 2016-03-30 LAB — COMPREHENSIVE METABOLIC PANEL
ALBUMIN: 2.7 g/dL — AB (ref 3.5–5.0)
ALT: 10 U/L — ABNORMAL LOW (ref 14–54)
ANION GAP: 11 (ref 5–15)
AST: 34 U/L (ref 15–41)
Alkaline Phosphatase: 188 U/L — ABNORMAL HIGH (ref 38–126)
BILIRUBIN TOTAL: 0.9 mg/dL (ref 0.3–1.2)
BUN: 29 mg/dL — ABNORMAL HIGH (ref 6–20)
CHLORIDE: 95 mmol/L — AB (ref 101–111)
CO2: 28 mmol/L (ref 22–32)
Calcium: 8.7 mg/dL — ABNORMAL LOW (ref 8.9–10.3)
Creatinine, Ser: 3.6 mg/dL — ABNORMAL HIGH (ref 0.44–1.00)
GFR calc Af Amer: 16 mL/min — ABNORMAL LOW (ref >60)
GFR, EST NON AFRICAN AMERICAN: 14 mL/min — AB (ref >60)
Glucose, Bld: 180 mg/dL — ABNORMAL HIGH (ref 65–99)
POTASSIUM: 4 mmol/L (ref 3.5–5.1)
Sodium: 134 mmol/L — ABNORMAL LOW (ref 135–145)
TOTAL PROTEIN: 6.6 g/dL (ref 6.5–8.1)

## 2016-03-30 MED ORDER — SODIUM CHLORIDE 0.9 % IV SOLN: Freq: Once | INTRAVENOUS | Status: DC

## 2016-03-30 MED ORDER — SODIUM CHLORIDE 0.9% FLUSH
10.0000 mL | INTRAVENOUS | Status: DC | PRN
  Administered 2016-03-30: 10 mL
  Filled 2016-03-30: qty 10

## 2016-03-30 MED ORDER — ONDANSETRON 8 MG PO TBDP
8.0000 mg | ORAL_TABLET | Freq: Once | ORAL | Status: AC
  Administered 2016-03-30: 8 mg via ORAL
  Filled 2016-03-30: qty 1

## 2016-03-30 MED ORDER — BENZONATATE 200 MG PO CAPS
200.0000 mg | ORAL_CAPSULE | Freq: Three times a day (TID) | ORAL | 1 refills | Status: DC | PRN
Start: 2016-03-30 — End: 2016-05-26

## 2016-03-30 MED ORDER — HEPARIN SOD (PORK) LOCK FLUSH 100 UNIT/ML IV SOLN
500.0000 [IU] | Freq: Once | INTRAVENOUS | Status: AC | PRN
  Administered 2016-03-30: 500 [IU]
  Filled 2016-03-30: qty 5

## 2016-03-30 MED ORDER — SODIUM CHLORIDE 0.9 % IV SOLN
Freq: Once | INTRAVENOUS | Status: AC
  Administered 2016-03-30: 11:00:00 via INTRAVENOUS
  Filled 2016-03-30: qty 1000

## 2016-03-30 MED ORDER — PROCHLORPERAZINE MALEATE 10 MG PO TABS
10.0000 mg | ORAL_TABLET | Freq: Once | ORAL | Status: AC
  Administered 2016-03-30: 10 mg via ORAL
  Filled 2016-03-30: qty 1

## 2016-03-30 MED ORDER — DEXTROSE 5 % IV SOLN
20.0000 mg/m2 | Freq: Once | INTRAVENOUS | Status: AC
  Administered 2016-03-30: 30 mg via INTRAVENOUS
  Filled 2016-03-30: qty 15

## 2016-03-30 NOTE — Progress Notes (Signed)
Patient is here today for follow up, she reports hip pain and worse when standing in both sides, she also has a cough and would like a refill on her tessalon.

## 2016-03-30 NOTE — Addendum Note (Signed)
Addended by: Telford Nab on: 03/30/2016 10:19 AM   Modules accepted: Orders

## 2016-03-31 ENCOUNTER — Ambulatory Visit
Admission: RE | Admit: 2016-03-31 | Discharge: 2016-04-01 | Disposition: A | Discharge status: Home / Self Care | Payer: Self-pay | Source: Ambulatory Visit | Attending: Vascular Surgery | Admitting: Vascular Surgery

## 2016-03-31 ENCOUNTER — Inpatient Hospital Stay: Payer: Self-pay

## 2016-03-31 ENCOUNTER — Encounter
Admission: RE | Disposition: A | Discharge status: Home / Self Care | Payer: Self-pay | Source: Ambulatory Visit | Attending: Vascular Surgery

## 2016-03-31 DIAGNOSIS — Z9851 Tubal ligation status: Secondary | ICD-10-CM | POA: Insufficient documentation

## 2016-03-31 DIAGNOSIS — Z452 Encounter for adjustment and management of vascular access device: Secondary | ICD-10-CM | POA: Insufficient documentation

## 2016-03-31 DIAGNOSIS — Z8342 Family history of familial hypercholesterolemia: Secondary | ICD-10-CM | POA: Insufficient documentation

## 2016-03-31 DIAGNOSIS — M1990 Unspecified osteoarthritis, unspecified site: Secondary | ICD-10-CM | POA: Insufficient documentation

## 2016-03-31 DIAGNOSIS — I120 Hypertensive chronic kidney disease with stage 5 chronic kidney disease or end stage renal disease: Secondary | ICD-10-CM | POA: Insufficient documentation

## 2016-03-31 DIAGNOSIS — E1122 Type 2 diabetes mellitus with diabetic chronic kidney disease: Secondary | ICD-10-CM | POA: Insufficient documentation

## 2016-03-31 DIAGNOSIS — N186 End stage renal disease: Secondary | ICD-10-CM | POA: Insufficient documentation

## 2016-03-31 DIAGNOSIS — C9002 Multiple myeloma in relapse: Secondary | ICD-10-CM

## 2016-03-31 DIAGNOSIS — Z8249 Family history of ischemic heart disease and other diseases of the circulatory system: Secondary | ICD-10-CM | POA: Insufficient documentation

## 2016-03-31 DIAGNOSIS — C9000 Multiple myeloma not having achieved remission: Secondary | ICD-10-CM | POA: Insufficient documentation

## 2016-03-31 DIAGNOSIS — Z9221 Personal history of antineoplastic chemotherapy: Secondary | ICD-10-CM | POA: Insufficient documentation

## 2016-03-31 DIAGNOSIS — Z992 Dependence on renal dialysis: Secondary | ICD-10-CM | POA: Insufficient documentation

## 2016-03-31 HISTORY — PX: PERIPHERAL VASCULAR CATHETERIZATION: SHX172C

## 2016-03-31 SURGERY — DIALYSIS/PERMA CATHETER REMOVAL
Anesthesia: Moderate Sedation

## 2016-03-31 MED ORDER — CARFILZOMIB CHEMO INJECTION 60 MG
20.0000 mg/m2 | Freq: Once | INTRAVENOUS | Status: AC
  Administered 2016-03-31: 30 mg via INTRAVENOUS
  Filled 2016-03-31: qty 15

## 2016-03-31 MED ORDER — LIDOCAINE-EPINEPHRINE (PF) 2 %-1:200000 IJ SOLN
INTRAMUSCULAR | Status: AC
  Filled 2016-03-31: qty 20

## 2016-03-31 MED ORDER — SODIUM CHLORIDE 0.9 % IV SOLN: Freq: Once | INTRAVENOUS | Status: DC

## 2016-03-31 MED ORDER — SODIUM CHLORIDE 0.9 % IV SOLN
Freq: Once | INTRAVENOUS | Status: AC
  Filled 2016-03-31: qty 1000

## 2016-03-31 MED ORDER — ONDANSETRON 8 MG PO TBDP
8.0000 mg | ORAL_TABLET | Freq: Once | ORAL | Status: AC
  Administered 2016-03-31: 8 mg via ORAL
  Filled 2016-03-31: qty 1

## 2016-03-31 MED ORDER — SODIUM CHLORIDE 0.9 % IV SOLN
Freq: Once | INTRAVENOUS | Status: AC
  Administered 2016-03-31: 09:00:00 via INTRAVENOUS
  Filled 2016-03-31: qty 1000

## 2016-03-31 MED ORDER — HEPARIN SOD (PORK) LOCK FLUSH 100 UNIT/ML IV SOLN
500.0000 [IU] | Freq: Once | INTRAVENOUS | Status: AC | PRN
  Administered 2016-03-31: 500 [IU]
  Filled 2016-03-31: qty 5

## 2016-03-31 MED ORDER — LIDOCAINE-EPINEPHRINE (PF) 1 %-1:200000 IJ SOLN
INTRAMUSCULAR | Status: DC | PRN
  Administered 2016-03-31: 20 mL via INTRADERMAL

## 2016-03-31 MED ORDER — PROCHLORPERAZINE MALEATE 10 MG PO TABS
10.0000 mg | ORAL_TABLET | Freq: Once | ORAL | Status: AC
  Administered 2016-03-31: 10 mg via ORAL
  Filled 2016-03-31: qty 1

## 2016-03-31 SURGICAL SUPPLY — 7 items
DERMABOND ADVANCED (GAUZE/BANDAGES/DRESSINGS) ×1
DERMABOND ADVANCED .7 DNX12 (GAUZE/BANDAGES/DRESSINGS) ×1 IMPLANT
FCP FG STRG 5.5XNS LF DISP (INSTRUMENTS) ×1
FORCEPS FG STRG 5.5XNS LF DISP (INSTRUMENTS) ×1 IMPLANT
FORCEPS KELLY 5.5 STR (INSTRUMENTS) ×1
SUT MNCRL AB 4-0 PS2 18 (SUTURE) ×2 IMPLANT
TRAY LACERAT/PLASTIC (MISCELLANEOUS) ×2 IMPLANT

## 2016-03-31 NOTE — H&P (Signed)
[facility] Admission History & Physical  MRN : [id number]  Jamie Keller is a 51 y.o. (1965/09/20) female who presents with chief complaint of No chief complaint on file. Marland Kitchen  History of Present Illness: I am asked to evaluate the patient by the dialysis center. The patient was sent here because they had been using her left arm brachial axillary graft and she is now asking for her tunneled catheter to be removed. The patient is unaware of any other change.  Patient denies pain or tenderness overlying the access.  There is no pain with dialysis.  The patient denies hand pain or finger pain consistent with steal syndrome.   There have been 1 past interventions with declots of this access.  The patient is not chronically hypotensive on dialysis.  No current facility-administered medications for this encounter.     Past Medical History:  Diagnosis Date  . Anemia   . Anxiety   . Chronic kidney disease    DIALYSIS  . Depression   . Diabetes mellitus without complication ([place])   . Dialysis patient ([place])    Mon, Wed, Fri  . History of chemotherapy    chemo on Tuesday and Wednesday  . Hypertension   . Multiple myeloma ([place])   . Neuropathy associated with cancer ([place])   . Osteoarthritis   . Pancreatitis   . Pneumonia    September 2017, [facility]  . Post-menopausal 2014  . Sepsis [facility])     Past Surgical History:  Procedure Laterality Date  . AV FISTULA PLACEMENT Left 02/20/2016   Procedure: INSERTION OF ARTERIOVENOUS (AV) GORE-TEX GRAFT ARM;  Surgeon: Katha Cabal, MD;  Location: [facility] ORS;  Service: Vascular;  Laterality: Left;  . CESAREAN SECTION     x2  . dialysis catheter placement    . PERIPHERAL VASCULAR CATHETERIZATION N/A 11/19/2015   Procedure: Dialysis/Perma Catheter Insertion;  Surgeon: Algernon Huxley, MD;  Location: [place] [facility];  Service: Cardiovascular;  Laterality: N/A;  . PERIPHERAL VASCULAR CATHETERIZATION Left 03/10/2016    Procedure: Thrombectomy;  Surgeon: Katha Cabal, MD;  Location: [place] [facility];  Service: Cardiovascular;  Laterality: Left;  . PORTACATH PLACEMENT    . TUBAL LIGATION      Social History Social History  Substance Use Topics  . Smoking status: Never Smoker  . Smokeless tobacco: Never Used  . Alcohol use No    Family History Family History  Problem Relation Age of Onset  . Hypercholesterolemia Mother   . Hypertension Mother   . Hypertension Father     No family history of bleeding or clotting disorders, autoimmune disease or porphyria  No Known Allergies   REVIEW OF SYSTEMS (Negative unless checked)  Constitutional: _0 Weight loss  _1 Fever  _2 Chills Cardiac: _3 Chest pain   _4 Chest pressure   _5 Palpitations   _6 Shortness of breath when laying flat   _7 Shortness of breath at rest   _8 Shortness of breath with exertion. Vascular:  _9 Pain in legs with walking   _10 Pain in legs at rest   _11 Pain in legs when laying flat   _12 Claudication   _13 Pain in feet when walking  _14 Pain in feet at rest  _15 Pain in feet when laying flat   _16 History of DVT   _17 Phlebitis   _18 Swelling in legs   _19 Varicose veins   _20 Non-healing ulcers Pulmonary:   _21 Uses home oxygen   _22 Productive cough   _23 Hemoptysis   _24 Wheeze  _25 COPD   _26 Asthma Neurologic:  _27 Dizziness  _28 Blackouts   _29   Seizures   _0 History of stroke   _1 History of TIA  _2 Aphasia   _3 Temporary blindness   _4 Dysphagia   _5 Weakness or numbness in arms   _6 Weakness or numbness in legs Musculoskeletal:  _7 Arthritis   _8 Joint swelling   _9 Joint pain   _10 Low back pain Hematologic:  _11 Easy bruising  _12 Easy bleeding   _13 Hypercoagulable state   _14 Anemic  _15 Hepatitis Gastrointestinal:  _16 Blood in stool   _17 Vomiting blood  _18 Gastroesophageal reflux/heartburn   _19 Difficulty swallowing. Genitourinary:  _20 Chronic kidney disease   _21 Difficult urination  _22 Frequent urination  _23 Burning with urination   _24 Blood in urine Skin:  _25 Rashes   _26 Ulcers    _27 Wounds Psychological:  _28 History of anxiety   _29  History of major depression.  Physical Examination  Vitals:   03/31/16 1101  BP: 125/66  Pulse: 89  Resp: 16  Temp: 99.1 F (37.3 C)  TempSrc: Oral  SpO2: 98%  Weight: 45.4 kg (100 lb)  Height: 5' (1.524 m)   Body mass index is 19.53 kg/m. Gen: WD/WN, NAD Head: [place]/AT, No temporalis wasting. Prominent temp pulse not noted. Ear/Nose/Throat: Hearing grossly intact, nares w/o erythema or drainage, oropharynx w/o Erythema/Exudate,  Eyes: Conjunctiva clear, sclera non-icteric Neck: Trachea midline.  No JVD.  Pulmonary:  Good air movement, respirations not labored, no use of accessory muscles.  Cardiac: RRR, normal S1, S2. Vascular: Left brachial axillary graft good thrill good bruit skin integrity is good no evidence of ulceration or cellulitis incisions are well-healed Vessel Right Left  Radial Palpable Palpable  Ulnar Not Palpable Not Palpable  Brachial Palpable Palpable  Carotid Palpable, without bruit Palpable, without bruit  Gastrointestinal: soft, non-tender/non-distended. No guarding/reflex.  Musculoskeletal: M/S 5/5 throughout.  Extremities without ischemic changes.  No deformity or atrophy.  Neurologic: Sensation grossly intact in extremities.  Symmetrical.  Speech is fluent. Motor exam as listed above. Psychiatric: Judgment intact, Mood & affect appropriate for pt's clinical situation. Dermatologic: No rashes or ulcers noted.  No cellulitis or open wounds. Lymph : No Cervical, Axillary, or Inguinal lymphadenopathy.   CBC Lab Results  Component Value Date   WBC 3.7 03/30/2016   HGB 8.5 (L) 03/30/2016   HCT 25.2 (L) 03/30/2016   MCV 94.8 03/30/2016   PLT 130 (L) 03/30/2016    BMET    Component Value Date/Time   NA 134 (L) 03/30/2016 0857   NA 137 06/27/2014 1344   K 4.0 03/30/2016 0857   K 4.0 06/27/2014 1344   CL 95 (L) 03/30/2016 0857   CL 106 06/27/2014 1344   CO2 28 03/30/2016 0857   CO2 26 06/27/2014  1344   GLUCOSE 180 (H) 03/30/2016 0857   GLUCOSE 83 06/27/2014 1344   BUN 29 (H) 03/30/2016 0857   BUN 27 (H) 06/27/2014 1344   CREATININE 3.60 (H) 03/30/2016 0857   CREATININE 1.36 (H) 06/27/2014 1344   CALCIUM 8.7 (L) 03/30/2016 0857   CALCIUM 9.1 06/27/2014 1344   GFRNONAA 14 (L) 03/30/2016 0857   GFRNONAA 46 (L) 06/27/2014 1344   GFRAA 16 (L) 03/30/2016 0857   GFRAA 53 (L) 06/27/2014 1344   Estimated Creatinine Clearance: 13.3 mL/min (by C-G formula based on SCr of 3.6 mg/dL (H)).  COAG Lab Results  Component Value Date   INR 1.27 02/17/2016   INR 1.51 01/14/2016    Radiology No results found.  Assessment/Plan 1.  Complication dialysis device with thrombosis AV access:  Patient's Left brachial axillary dialysis access is Working well. The patient will undergo removal of her tunneled catheter.  The risks and benefits were described to the patient.  All questions were answered.  The patient agrees to proceed with removal. 2.  End-stage renal disease requiring hemodialysis:  Patient will continue dialysis therapy without further interruption. Dialysis has already been arranged since the patient missed their previous session 3.  Hypertension:  Patient will continue medical management; nephrology is following no changes in oral medications. 4. Diabetes mellitus:  Glucose will be monitored and oral medications been held this morning once the patient has undergone the patient's procedure po intake will be reinitiated and again Accu-Cheks will be used to assess the blood glucose level and treat as needed. The patient will be restarted on the patient's usual hypoglycemic regime 5.  Multiple myeloma:  Patient will continue chemotherapy as scheduled without interruption. Right IJ Infuse-a-Port is intact no further interventions at this time    Hortencia Pilar, MD  03/31/2016 11:45 AM

## 2016-03-31 NOTE — Discharge Instructions (Signed)
° °  Cuidado de la incisin (Incision Care) La incisin es el corte que el cirujano realiza en el cuerpo. Despus de la [place], es necesario brindarle el cuidado necesario para evitar que se infecte. [place] los medicamentos solamente como se lo haya indicado el mdico. Hay muchas maneras distintas de cerrar y cubrir un corte, como puntos, pegamento para la piel y tiras [place]. Siga las indicaciones del mdico para: Cuidar del corte. Cambiar y Press photographer el vendaje. Quitar el cierre del corte. No tome baos de inmersin, no practique natacin ni use el jacuzzi hasta que el mdico lo autorice. Puede ducharse como se lo haya indicado el mdico. Reanude su dieta y sus actividades habituales como se lo haya indicado el mdico. Use un medicamento que reduzca la picazn en el corte como se lo haya indicado el mdico. No se toque ni se rasque el corte. Beba suficiente lquido para mantener el pis (orina) claro o de color amarillo plido. SOLICITE AYUDA SI: Tiene enrojecimiento, hinchazn o dolor en el lugar del corte. Observa lquido, sangre o pus que sale del corte. Le duelen los msculos. Tiene nuseas o escalofros. Advierte un olor ftido que proviene de la herida o del vendaje. El corte se abre despus de que le [employer] puntos, las grapas o las tiras [place]. Sigue teniendo Higher education careers adviser (nuseas) o vmitos que no se interrumpen. Tiene fiebre. Tiene mareos. SOLICITE AYUDA DE INMEDIATO SI: Tiene una erupcin cutnea. Pierde el conocimiento (se desmaya). Tiene dificultad para respirar. ASEGRESE DE QUE: Comprende estas instrucciones. Controlar su afeccin. Recibir ayuda de inmediato si no mejora o si empeora. Esta informacin no tiene Marine scientist el consejo del mdico. Asegrese de hacerle al mdico cualquier pregunta que tenga. Document Released: 08/24/2011 Document Revised: 03/15/2014 Document Reviewed: 09/10/2015 Elsevier Interactive  Patient Education  2017 [employer].

## 2016-03-31 NOTE — Op Note (Signed)
  OPERATIVE NOTE   PROCEDURE: 1. Removal of a left IJ tunneled dialysis catheter  PRE-OPERATIVE DIAGNOSIS: Complication of dialysis catheter  POST-OPERATIVE DIAGNOSIS: Same  SURGEON: Hortencia Pilar  ANESTHESIA: Local anesthetic with 1% lidocaine with epinephrine   ESTIMATED BLOOD LOSS: Minimal   FINDING(S): 1. Catheter intact   SPECIMEN(S):  Catheter  INDICATIONS:   Jamie Keller is a 51 y.o. female who presents with functioning left arm brachial axillary graft and a clotted tunneled catheter.  The risks and benefits for catheter removal of been reviewed all questions answered patient agrees to proceed  DESCRIPTION: After obtaining full informed written consent, the patient was positioned supine. The left IJ tunneled catheter and surrounding area is prepped and draped in a sterile fashion. The cuff was localized by palpation and noted to be greater than 3 cm from the exit site. After appropriate timeout is called, 1% lidocaine with epinephrine is infiltrated into the surrounding tissues around the cuff. Small transverse incision is created with an 11 blade scalpel and the dissection was carried down to expose the cuff of the tunneled catheter.  The catheter is then freed from the surrounding attachments and adhesions. Once the catheter has been freed circumferentially it is transected just distal to the cuff and subsequently removed in 2 pieces. Light pressure was held at the base of the neck. A 4-0 Monocryl was used close the tunnel in the subcutaneous space. The 4-0 Monocryl Monocryl was then used to close the skin in a subcuticular stitch. Dermabond is applied.  Antibiotic ointment and a sterile dressing is applied to the exit site. Patient tolerated procedure well and there were no complications.  COMPLICATIONS: None  CONDITION: Unchanged  Hortencia Pilar. [facility] Office: [phone]  03/31/2016,5:00 PM

## 2016-04-01 ENCOUNTER — Emergency Department: Payer: Self-pay

## 2016-04-01 ENCOUNTER — Inpatient Hospital Stay
Admission: EM | Admit: 2016-04-01 | Discharge: 2016-04-02 | DRG: 871 | Disposition: A | Discharge status: Home / Self Care | Payer: Self-pay | Attending: Internal Medicine | Admitting: Internal Medicine

## 2016-04-01 ENCOUNTER — Encounter: Payer: Self-pay | Admitting: Emergency Medicine

## 2016-04-01 ENCOUNTER — Other Ambulatory Visit: Payer: Self-pay

## 2016-04-01 DIAGNOSIS — D631 Anemia in chronic kidney disease: Secondary | ICD-10-CM | POA: Diagnosis present

## 2016-04-01 DIAGNOSIS — Z9115 Patient's noncompliance with renal dialysis: Secondary | ICD-10-CM

## 2016-04-01 DIAGNOSIS — C9002 Multiple myeloma in relapse: Secondary | ICD-10-CM | POA: Diagnosis present

## 2016-04-01 DIAGNOSIS — Z6820 Body mass index (BMI) 20.0-20.9, adult: Secondary | ICD-10-CM

## 2016-04-01 DIAGNOSIS — Z79899 Other long term (current) drug therapy: Secondary | ICD-10-CM

## 2016-04-01 DIAGNOSIS — J1001 Influenza due to other identified influenza virus with the same other identified influenza virus pneumonia: Secondary | ICD-10-CM | POA: Diagnosis present

## 2016-04-01 DIAGNOSIS — E8770 Fluid overload, unspecified: Secondary | ICD-10-CM | POA: Diagnosis present

## 2016-04-01 DIAGNOSIS — Z780 Asymptomatic menopausal state: Secondary | ICD-10-CM

## 2016-04-01 DIAGNOSIS — N186 End stage renal disease: Secondary | ICD-10-CM | POA: Diagnosis present

## 2016-04-01 DIAGNOSIS — E875 Hyperkalemia: Secondary | ICD-10-CM | POA: Diagnosis present

## 2016-04-01 DIAGNOSIS — E43 Unspecified severe protein-calorie malnutrition: Secondary | ICD-10-CM | POA: Diagnosis present

## 2016-04-01 DIAGNOSIS — J101 Influenza due to other identified influenza virus with other respiratory manifestations: Secondary | ICD-10-CM | POA: Diagnosis present

## 2016-04-01 DIAGNOSIS — Z992 Dependence on renal dialysis: Secondary | ICD-10-CM

## 2016-04-01 DIAGNOSIS — Z9221 Personal history of antineoplastic chemotherapy: Secondary | ICD-10-CM

## 2016-04-01 DIAGNOSIS — Z8249 Family history of ischemic heart disease and other diseases of the circulatory system: Secondary | ICD-10-CM

## 2016-04-01 DIAGNOSIS — N179 Acute kidney failure, unspecified: Secondary | ICD-10-CM

## 2016-04-01 DIAGNOSIS — J9601 Acute respiratory failure with hypoxia: Secondary | ICD-10-CM | POA: Diagnosis present

## 2016-04-01 DIAGNOSIS — R05 Cough: Secondary | ICD-10-CM

## 2016-04-01 DIAGNOSIS — N2581 Secondary hyperparathyroidism of renal origin: Secondary | ICD-10-CM | POA: Diagnosis present

## 2016-04-01 DIAGNOSIS — E1122 Type 2 diabetes mellitus with diabetic chronic kidney disease: Secondary | ICD-10-CM | POA: Diagnosis present

## 2016-04-01 DIAGNOSIS — A4189 Other specified sepsis: Principal | ICD-10-CM | POA: Diagnosis present

## 2016-04-01 DIAGNOSIS — A419 Sepsis, unspecified organism: Secondary | ICD-10-CM | POA: Diagnosis present

## 2016-04-01 DIAGNOSIS — R059 Cough, unspecified: Secondary | ICD-10-CM

## 2016-04-01 DIAGNOSIS — R64 Cachexia: Secondary | ICD-10-CM | POA: Diagnosis present

## 2016-04-01 DIAGNOSIS — J811 Chronic pulmonary edema: Secondary | ICD-10-CM | POA: Diagnosis present

## 2016-04-01 DIAGNOSIS — Z794 Long term (current) use of insulin: Secondary | ICD-10-CM

## 2016-04-01 DIAGNOSIS — R0600 Dyspnea, unspecified: Secondary | ICD-10-CM

## 2016-04-01 DIAGNOSIS — I120 Hypertensive chronic kidney disease with stage 5 chronic kidney disease or end stage renal disease: Secondary | ICD-10-CM | POA: Diagnosis present

## 2016-04-01 DIAGNOSIS — N189 Chronic kidney disease, unspecified: Secondary | ICD-10-CM

## 2016-04-01 LAB — CBC WITH DIFFERENTIAL/PLATELET
BASOS ABS: 0 10*3/uL (ref 0–0.1)
Basophils Relative: 0 %
Eosinophils Absolute: 0 10*3/uL (ref 0–0.7)
Eosinophils Relative: 0 %
HEMATOCRIT: 26.9 % — AB (ref 35.0–47.0)
Hemoglobin: 8.8 g/dL — ABNORMAL LOW (ref 12.0–16.0)
LYMPHS ABS: 0.5 10*3/uL — AB (ref 1.0–3.6)
LYMPHS PCT: 8 %
MCH: 31.8 pg (ref 26.0–34.0)
MCHC: 32.7 g/dL (ref 32.0–36.0)
MCV: 97.2 fL (ref 80.0–100.0)
MONO ABS: 0.6 10*3/uL (ref 0.2–0.9)
MONOS PCT: 10 %
NEUTROS ABS: 5.2 10*3/uL (ref 1.4–6.5)
Neutrophils Relative %: 82 %
Platelets: 104 10*3/uL — ABNORMAL LOW (ref 150–440)
RBC: 2.76 MIL/uL — ABNORMAL LOW (ref 3.80–5.20)
RDW: 22.2 % — ABNORMAL HIGH (ref 11.5–14.5)
WBC: 6.4 10*3/uL (ref 3.6–11.0)

## 2016-04-01 LAB — BLOOD GAS, VENOUS
Acid-base deficit: 10.4 mmol/L — ABNORMAL HIGH (ref 0.0–2.0)
Bicarbonate: 15.6 mmol/L — ABNORMAL LOW (ref 20.0–28.0)
O2 SAT: 62.9 %
PATIENT TEMPERATURE: 37
pCO2, Ven: 34 mmHg — ABNORMAL LOW (ref 44.0–60.0)
pH, Ven: 7.27 (ref 7.250–7.430)
pO2, Ven: 38 mmHg (ref 32.0–45.0)

## 2016-04-01 LAB — COMPREHENSIVE METABOLIC PANEL
ALBUMIN: 2.7 g/dL — AB (ref 3.5–5.0)
ALT: 12 U/L — AB (ref 14–54)
AST: 50 U/L — AB (ref 15–41)
Alkaline Phosphatase: 238 U/L — ABNORMAL HIGH (ref 38–126)
Anion gap: 25 — ABNORMAL HIGH (ref 5–15)
BUN: 69 mg/dL — AB (ref 6–20)
CHLORIDE: 92 mmol/L — AB (ref 101–111)
CO2: 16 mmol/L — AB (ref 22–32)
Calcium: 8.9 mg/dL (ref 8.9–10.3)
Creatinine, Ser: 5.98 mg/dL — ABNORMAL HIGH (ref 0.44–1.00)
GFR calc Af Amer: 9 mL/min — ABNORMAL LOW (ref >60)
GFR calc non Af Amer: 7 mL/min — ABNORMAL LOW (ref >60)
GLUCOSE: 137 mg/dL — AB (ref 65–99)
POTASSIUM: 6.2 mmol/L — AB (ref 3.5–5.1)
Sodium: 133 mmol/L — ABNORMAL LOW (ref 135–145)
Total Bilirubin: 1.2 mg/dL (ref 0.3–1.2)
Total Protein: 6.6 g/dL (ref 6.5–8.1)

## 2016-04-01 LAB — TROPONIN I: Troponin I: 0.04 ng/mL (ref <0.03)

## 2016-04-01 LAB — INFLUENZA PANEL BY PCR (TYPE A & B)
Influenza A By PCR: NEGATIVE
Influenza B By PCR: POSITIVE — AB

## 2016-04-01 LAB — BRAIN NATRIURETIC PEPTIDE: B NATRIURETIC PEPTIDE 5: 1769 pg/mL — AB (ref 0.0–100.0)

## 2016-04-01 LAB — LACTIC ACID, PLASMA
LACTIC ACID, VENOUS: 6.9 mmol/L — AB (ref 0.5–1.9)
Lactic Acid, Venous: 8.2 mmol/L (ref 0.5–1.9)

## 2016-04-01 LAB — GLUCOSE, CAPILLARY: GLUCOSE-CAPILLARY: 231 mg/dL — AB (ref 65–99)

## 2016-04-01 LAB — MRSA PCR SCREENING: MRSA BY PCR: NEGATIVE

## 2016-04-01 MED ORDER — INSULIN ASPART 100 UNIT/ML ~~LOC~~ SOLN
0.0000 [IU] | Freq: Three times a day (TID) | SUBCUTANEOUS | Status: DC
  Administered 2016-04-02: 5 [IU] via SUBCUTANEOUS
  Administered 2016-04-02: 9 [IU] via SUBCUTANEOUS
  Filled 2016-04-01: qty 9
  Filled 2016-04-01: qty 5

## 2016-04-01 MED ORDER — ACETAMINOPHEN 650 MG RE SUPP
650.0000 mg | Freq: Four times a day (QID) | RECTAL | Status: DC | PRN
  Filled 2016-04-01: qty 1

## 2016-04-01 MED ORDER — LEVOFLOXACIN 500 MG PO TABS
250.0000 mg | ORAL_TABLET | ORAL | Status: DC
  Administered 2016-04-01: 250 mg via ORAL

## 2016-04-01 MED ORDER — METHYLPREDNISOLONE SODIUM SUCC 125 MG IJ SOLR
INTRAMUSCULAR | Status: AC
  Administered 2016-04-01: 60 mg via INTRAVENOUS
  Filled 2016-04-01: qty 2

## 2016-04-01 MED ORDER — SODIUM CHLORIDE 0.9% FLUSH
3.0000 mL | Freq: Two times a day (BID) | INTRAVENOUS | Status: DC
  Administered 2016-04-02: 3 mL via INTRAVENOUS

## 2016-04-01 MED ORDER — SODIUM CHLORIDE 0.9 % IV SOLN
250.0000 mL | INTRAVENOUS | Status: DC | PRN
  Administered 2016-04-01: 250 mL via INTRAVENOUS

## 2016-04-01 MED ORDER — INSULIN ASPART 100 UNIT/ML ~~LOC~~ SOLN
10.0000 [IU] | Freq: Once | SUBCUTANEOUS | Status: AC
  Administered 2016-04-01: 10 [IU] via INTRAVENOUS
  Filled 2016-04-01: qty 10

## 2016-04-01 MED ORDER — METHYLPREDNISOLONE SODIUM SUCC 125 MG IJ SOLR
60.0000 mg | Freq: Two times a day (BID) | INTRAMUSCULAR | Status: DC
  Administered 2016-04-01 – 2016-04-02 (×3): 60 mg via INTRAVENOUS
  Filled 2016-04-01 (×2): qty 2

## 2016-04-01 MED ORDER — BENZONATATE 100 MG PO CAPS
200.0000 mg | ORAL_CAPSULE | Freq: Three times a day (TID) | ORAL | Status: DC | PRN
  Filled 2016-04-01: qty 2

## 2016-04-01 MED ORDER — POLYETHYLENE GLYCOL 3350 17 G PO PACK
17.0000 g | PACK | Freq: Every day | ORAL | Status: DC | PRN
  Filled 2016-04-01: qty 1

## 2016-04-01 MED ORDER — HYDROXYZINE HCL 10 MG PO TABS: 10.0000 mg | ORAL_TABLET | Freq: Three times a day (TID) | ORAL | Status: DC | PRN

## 2016-04-01 MED ORDER — ACETAMINOPHEN 325 MG PO TABS: 650.0000 mg | ORAL_TABLET | Freq: Four times a day (QID) | ORAL | Status: DC | PRN

## 2016-04-01 MED ORDER — OSELTAMIVIR PHOSPHATE 30 MG PO CAPS
30.0000 mg | ORAL_CAPSULE | Freq: Two times a day (BID) | ORAL | Status: DC
  Administered 2016-04-01 – 2016-04-02 (×2): 30 mg via ORAL
  Filled 2016-04-01 (×4): qty 1

## 2016-04-01 MED ORDER — HYDROCODONE-ACETAMINOPHEN 5-325 MG PO TABS
1.0000 | ORAL_TABLET | Freq: Four times a day (QID) | ORAL | Status: DC | PRN
  Administered 2016-04-01: 2 via ORAL
  Filled 2016-04-01: qty 2

## 2016-04-01 MED ORDER — BISACODYL 5 MG PO TBEC: 5.0000 mg | DELAYED_RELEASE_TABLET | Freq: Every day | ORAL | Status: DC | PRN

## 2016-04-01 MED ORDER — LEVOFLOXACIN 500 MG PO TABS
ORAL_TABLET | ORAL | Status: AC
  Administered 2016-04-01: 250 mg via ORAL
  Filled 2016-04-01: qty 1

## 2016-04-01 MED ORDER — SODIUM CHLORIDE 0.9 % IV SOLN
1.0000 g | Freq: Once | INTRAVENOUS | Status: AC
  Administered 2016-04-01: 1 g via INTRAVENOUS
  Filled 2016-04-01: qty 10

## 2016-04-01 MED ORDER — CITALOPRAM HYDROBROMIDE 20 MG PO TABS
20.0000 mg | ORAL_TABLET | Freq: Every day | ORAL | Status: DC
  Administered 2016-04-02: 20 mg via ORAL
  Filled 2016-04-01: qty 1

## 2016-04-01 MED ORDER — ALBUTEROL SULFATE (2.5 MG/3ML) 0.083% IN NEBU
2.5000 mg | INHALATION_SOLUTION | RESPIRATORY_TRACT | Status: DC | PRN
  Filled 2016-04-01: qty 3

## 2016-04-01 MED ORDER — SODIUM CHLORIDE 0.9% FLUSH: 3.0000 mL | INTRAVENOUS | Status: DC | PRN

## 2016-04-01 MED ORDER — HEPARIN SODIUM (PORCINE) 5000 UNIT/ML IJ SOLN
5000.0000 [IU] | Freq: Three times a day (TID) | INTRAMUSCULAR | Status: DC
  Administered 2016-04-01 – 2016-04-02 (×2): 5000 [IU] via SUBCUTANEOUS
  Filled 2016-04-01 (×3): qty 1

## 2016-04-01 MED ORDER — ONDANSETRON HCL 4 MG/2ML IJ SOLN: 4.0000 mg | Freq: Four times a day (QID) | INTRAMUSCULAR | Status: DC | PRN

## 2016-04-01 MED ORDER — SODIUM CHLORIDE 0.9% FLUSH
3.0000 mL | Freq: Two times a day (BID) | INTRAVENOUS | Status: DC
  Administered 2016-04-01: 3 mL via INTRAVENOUS

## 2016-04-01 MED ORDER — SODIUM BICARBONATE 8.4 % IV SOLN
50.0000 meq | Freq: Once | INTRAVENOUS | Status: AC
  Administered 2016-04-01: 50 meq via INTRAVENOUS
  Filled 2016-04-01: qty 50

## 2016-04-01 MED ORDER — DEXTROSE 50 % IV SOLN
25.0000 g | Freq: Once | INTRAVENOUS | Status: AC
  Administered 2016-04-01: 25 g via INTRAVENOUS
  Filled 2016-04-01: qty 50

## 2016-04-01 MED ORDER — ONDANSETRON HCL 4 MG PO TABS
4.0000 mg | ORAL_TABLET | Freq: Four times a day (QID) | ORAL | Status: DC | PRN
  Filled 2016-04-01: qty 1

## 2016-04-01 NOTE — Progress Notes (Signed)
  End of hd 

## 2016-04-01 NOTE — Progress Notes (Signed)
HD initiated without issue  

## 2016-04-01 NOTE — ED Provider Notes (Addendum)
[facility] Emergency Department Provider Note        Time seen: ----------------------------------------- 7:29 AM on 04/01/2016 -----------------------------------------    I have reviewed the triage vital signs and the nursing notes.   HISTORY  Chief Complaint Cough    HPI Jamie Keller is a 51 y.o. female who presents to the ER for cough for last 24 hours. Patient states cough is worsened over the night and made it hard for her to breathe. Patient currently is on chemotherapy for multiple myeloma and on dialysis Monday Wednesday Friday. She denies any fevers, chills or other complaints. Patient has difficulty laying flat. She has not had productive cough.   Past Medical History:  Diagnosis Date  . Anemia   . Anxiety   . Chronic kidney disease    DIALYSIS  . Depression   . Diabetes mellitus without complication ([place])   . Dialysis patient ([place])    Mon, Wed, Fri  . History of chemotherapy    chemo on Tuesday and Wednesday  . Hypertension   . Multiple myeloma ([place])   . Neuropathy associated with cancer ([place])   . Osteoarthritis   . Pancreatitis   . Pneumonia    September 2017, [facility]  . Post-menopausal 2014  . Sepsis [facility])     Patient Active Problem List   Diagnosis Date Noted  . Epigastric pain   . Sepsis ([place]) 12/01/2015  . HCAP (healthcare-associated pneumonia) 12/01/2015  . End stage renal failure on dialysis ([place]) 12/01/2015  . HTN (hypertension) 12/01/2015  . Depression 12/01/2015  . Acute respiratory failure ([place]) 11/14/2015  . Acute renal failure ([place])   . Multiple myeloma in relapse ([place]) 09/06/2015  . Post-menopausal   . Bulging lumbar disc 05/28/2014    Past Surgical History:  Procedure Laterality Date  . AV FISTULA PLACEMENT Left 02/20/2016   Procedure: INSERTION OF ARTERIOVENOUS (AV) GORE-TEX GRAFT ARM;  Surgeon: Katha Cabal, MD;  Location: [facility] ORS;  Service: Vascular;  Laterality: Left;  . CESAREAN  SECTION     x2  . dialysis catheter placement    . PERIPHERAL VASCULAR CATHETERIZATION N/A 11/19/2015   Procedure: Dialysis/Perma Catheter Insertion;  Surgeon: Algernon Huxley, MD;  Location: [place] CV LAB;  Service: Cardiovascular;  Laterality: N/A;  . PERIPHERAL VASCULAR CATHETERIZATION Left 03/10/2016   Procedure: Thrombectomy;  Surgeon: Katha Cabal, MD;  Location: [place] CV LAB;  Service: Cardiovascular;  Laterality: Left;  . PORTACATH PLACEMENT    . TUBAL LIGATION      Allergies Patient has no known allergies.  Social History Social History  Substance Use Topics  . Smoking status: Never Smoker  . Smokeless tobacco: Never Used  . Alcohol use No    Review of Systems Constitutional: Negative for fever. Cardiovascular: Negative for chest pain. Respiratory: Positive shortness of breath and cough Gastrointestinal: Negative for abdominal pain, vomiting and diarrhea. Genitourinary: Negative for dysuria. Musculoskeletal: Negative for back pain. Skin: Negative for rash. Neurological: Negative for headaches, focal weakness or numbness.  10-point ROS otherwise negative.  ____________________________________________   PHYSICAL EXAM:  VITAL SIGNS: ED Triage Vitals  Enc Vitals Group     BP 04/01/16 0642 (!) 117/47     Pulse Rate 04/01/16 0642 81     Resp 04/01/16 0642 15     Temp 04/01/16 0642 97.6 F (36.4 C)     Temp Source 04/01/16 0642 Oral     SpO2 04/01/16 0642 98 %     Weight 04/01/16 0648  100 lb (45.4 kg)     Height 04/01/16 0648 5' (1.524 m)     Head Circumference --      Peak Flow --      Pain Score --      Pain Loc --      Pain Edu? --      Excl. in [place]? --     Constitutional: Alert and oriented. Well appearing and in no distress. Eyes: Conjunctivae are normal. PERRL. Normal extraocular movements. ENT   Head: Normocephalic and atraumatic.   Nose: No congestion/rhinnorhea.   Mouth/Throat: Mucous membranes are moist.   Neck: No  stridor. Cardiovascular: Normal rate, regular rhythm. No murmurs, rubs, or gallops. Respiratory: Tachypnea with rales bilaterally, worse on the left Gastrointestinal: Soft and nontender. Normal bowel sounds Musculoskeletal: Nontender with normal range of motion in all extremities. No lower extremity tenderness nor edema. Neurologic:  Normal speech and language. No gross focal neurologic deficits are appreciated.  Skin:  Skin is warm, dry and intact. No rash noted. Psychiatric: Mood and affect are normal. Speech and behavior are normal.  ____________________________________________  EKG: Interpreted by me. Sinus rhythm with a rate of 83 bpm, normal PR interval, normal QRS, normal QT, normal axis.  ____________________________________________  ED COURSE:  Pertinent labs & imaging results that were available during my care of the patient were reviewed by me and considered in my medical decision making (see chart for details). She presents to the ER with cough, resembling volume overload. We will assess with basic labs and imaging.   Procedures ____________________________________________   LABS (pertinent positives/negatives)  Labs Reviewed  LACTIC ACID, PLASMA - Abnormal; Notable for the following:       Result Value   Lactic Acid, Venous 8.2 (*)    All other components within normal limits  COMPREHENSIVE METABOLIC PANEL - Abnormal; Notable for the following:    Sodium 133 (*)    Potassium 6.2 (*)    Chloride 92 (*)    CO2 16 (*)    Glucose, Bld 137 (*)    BUN 69 (*)    Creatinine, Ser 5.98 (*)    Albumin 2.7 (*)    AST 50 (*)    ALT 12 (*)    Alkaline Phosphatase 238 (*)    GFR calc non Af Amer 7 (*)    GFR calc Af Amer 9 (*)    Anion gap 25 (*)    All other components within normal limits  TROPONIN I - Abnormal; Notable for the following:    Troponin I 0.04 (*)    All other components within normal limits  CBC WITH DIFFERENTIAL/PLATELET - Abnormal; Notable for the  following:    RBC 2.76 (*)    Hemoglobin 8.8 (*)    HCT 26.9 (*)    RDW 22.2 (*)    Platelets 104 (*)    Lymphs Abs 0.5 (*)    All other components within normal limits  BLOOD GAS, VENOUS - Abnormal; Notable for the following:    pCO2, Ven 34 (*)    Bicarbonate 15.6 (*)    Acid-base deficit 10.4 (*)    All other components within normal limits  BRAIN NATRIURETIC PEPTIDE - Abnormal; Notable for the following:    B Natriuretic Peptide 1,769.0 (*)    All other components within normal limits  CULTURE, BLOOD (ROUTINE X 2)  CULTURE, BLOOD (ROUTINE X 2)  LACTIC ACID, PLASMA  URINALYSIS, COMPLETE (UACMP) WITH MICROSCOPIC    RADIOLOGY Images were  viewed by me  Chest x-ray IMPRESSION: 1. Poor inspiration with mild CHF with small effusions. 2. Right-sided Port-A-Cath tip overlies the expected SVC -RA junction. 3. Prior left central venous line has been removed.  CRITICAL CARE Performed by: Earleen Newport   Total critical care time: 30 minutes  Critical care time was exclusive of separately billable procedures and treating other patients.  Critical care was necessary to treat or prevent imminent or life-threatening deterioration.  Critical care was time spent personally by me on the following activities: development of treatment plan with patient and/or surrogate as well as nursing, discussions with consultants, evaluation of patient's response to treatment, examination of patient, obtaining history from patient or surrogate, ordering and performing treatments and interventions, ordering and review of laboratory studies, ordering and review of radiographic studies, pulse oximetry and re-evaluation of patient's condition.  ____________________________________________  FINAL ASSESSMENT AND PLAN  Cough, acute on chronic renal insufficiency, hyperkalemia, pleural effusions  Plan: Patient with labs and imaging as dictated above. Patient had presented with cough and worsening  shortness of breath. She does desaturate with any activity. I discussed with nephrology and the hospitalist service who will evaluate the patient and likely admit her for dialysis. We will treat her hyperkalemia with insulin, calcium, D50 and bicarbonate. She is stable for admission at this time.   Earleen Newport, MD   Note: This note was generated in part or whole with voice recognition software. Voice recognition is usually quite accurate but there are transcription errors that can and very often do occur. I apologize for any typographical errors that were not detected and corrected.     Earleen Newport, MD 04/01/16 0424    Earleen Newport, MD 04/01/16 [phone]

## 2016-04-01 NOTE — ED Notes (Signed)
Admitting MD called due to patient still on bed hold in ED, admission orders changed to off unit tele so patient can receive dialysis.

## 2016-04-01 NOTE — H&P (Signed)
Jamie Keller NAME: Jamie Keller    MR#:  [id number]  DATE OF BIRTH:  1965-05-01  DATE OF ADMISSION:  04/01/2016  PRIMARY CARE PHYSICIAN: [name], MD   REQUESTING/REFERRING PHYSICIAN: Dr. Jimmye Norman  CHIEF COMPLAINT:   Chief Complaint  Patient presents with  . Cough    HISTORY OF PRESENT ILLNESS:  Jamie Keller  is a 51 y.o. 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 She has noticed acutely worsening shortness of breath overnight and presented to the emergency room. Patient has 2 other family members sick with cough and cold. Afebrile. She has been found to have hyperkalemia on blood work along with mild pulmonary edema.  PAST MEDICAL HISTORY:   Past Medical History:  Diagnosis Date  . Anemia   . Anxiety   . Chronic kidney disease    DIALYSIS  . Depression   . Diabetes mellitus without complication ([place])   . Dialysis patient ([place])    Mon, Wed, Fri  . History of chemotherapy    chemo on Tuesday and Wednesday  . Hypertension   . Multiple myeloma ([place])   . Neuropathy associated with cancer ([place])   . Osteoarthritis   . Pancreatitis   . Pneumonia    September 2017, [facility]  . Post-menopausal 2014  . Sepsis ([place])     PAST SURGICAL HISTORY:   Past Surgical History:  Procedure Laterality Date  . AV FISTULA PLACEMENT Left 02/20/2016   Procedure: INSERTION OF ARTERIOVENOUS (AV) GORE-TEX GRAFT ARM;  Surgeon: Katha Cabal, MD;  Location: [facility] ORS;  Service: Vascular;  Laterality: Left;  . CESAREAN SECTION     x2  . dialysis catheter placement    . PERIPHERAL VASCULAR CATHETERIZATION N/A 11/19/2015   Procedure: Dialysis/Perma Catheter Insertion;  Surgeon: Algernon Huxley, MD;  Location: [place] CV LAB;  Service: Cardiovascular;  Laterality: N/A;   . PERIPHERAL VASCULAR CATHETERIZATION Left 03/10/2016   Procedure: Thrombectomy;  Surgeon: Katha Cabal, MD;  Location: [place] CV LAB;  Service: Cardiovascular;  Laterality: Left;  . PERIPHERAL VASCULAR CATHETERIZATION N/A 03/31/2016   Procedure: Dialysis/Perma Catheter Removal;  Surgeon: Katha Cabal, MD;  Location: [place] CV LAB;  Service: Cardiovascular;  Laterality: N/A;  . PORTACATH PLACEMENT    . TUBAL LIGATION      SOCIAL HISTORY:   Social History  Substance Use Topics  . Smoking status: Never Smoker  . Smokeless tobacco: Never Used  . Alcohol use No    FAMILY HISTORY:   Family History  Problem Relation Age of Onset  . Hypercholesterolemia Mother   . Hypertension Mother   . Hypertension Father     DRUG ALLERGIES:  No Known Allergies  REVIEW OF SYSTEMS:   Review of Systems  Constitutional: Positive for malaise/fatigue. Negative for chills and fever.  HENT: Negative for sore throat.   Eyes: Negative for blurred vision, double vision and pain.  Respiratory: Positive for cough, shortness of breath and wheezing. Negative for hemoptysis.   Cardiovascular: Negative for chest pain, palpitations, orthopnea and leg swelling.  Gastrointestinal: Negative for abdominal pain, constipation, diarrhea, heartburn, nausea and vomiting.  Genitourinary: Negative for dysuria and hematuria.  Musculoskeletal: Negative for back pain and joint pain.  Skin: Negative for rash.  Neurological: Positive for dizziness and weakness. Negative for sensory change, speech change, focal weakness and  headaches.  Endo/Heme/Allergies: Does not bruise/bleed easily.  Psychiatric/Behavioral: Negative for depression. The patient is not nervous/anxious.     MEDICATIONS AT HOME:   Prior to Admission medications   Medication Sig Start Date End Date Taking? Authorizing Provider  benzonatate (TESSALON) 200 MG capsule Take 1 capsule (200 mg total) by mouth 3 (three) times daily as needed  for cough. 03/30/16  Yes Lloyd Huger, MD  citalopram (CELEXA) 20 MG tablet Take 1 tablet (20 mg total) by mouth daily. 03/12/16  Yes Lloyd Huger, MD  HYDROcodone-acetaminophen (NORCO) 5-325 MG tablet Take 1-2 tablets by mouth every 6 (six) hours as needed for moderate pain or severe pain. 03/17/16  Yes Lloyd Huger, MD  hydrOXYzine (ATARAX/VISTARIL) 10 MG tablet Take 1 tablet (10 mg total) by mouth 3 (three) times daily as needed for itching. 03/16/16  Yes Lloyd Huger, MD  Insulin Detemir (LEVEMIR FLEXPEN) 100 UNIT/ML Pen Inject 5 Units into the skin daily at 10 pm.   Yes Historical Provider, MD  prochlorperazine (COMPAZINE) 10 MG tablet Take 10 mg by mouth every 6 (six) hours as needed for nausea or vomiting.   Yes Historical Provider, MD  verapamil (CALAN-SR) 120 MG CR tablet Take 120 mg by mouth daily.   Yes Historical Provider, MD  dronabinol (MARINOL) 2.5 MG capsule Take 1 capsule (2.5 mg total) by mouth 2 (two) times daily before a meal. Patient not taking: Reported on 03/16/2016 02/05/16   Lloyd Huger, MD  furosemide (LASIX) 80 MG tablet Take 1 tablet (80 mg total) by mouth daily. Patient not taking: Reported on 03/16/2016 11/29/15   Hillary Bow, MD     VITAL SIGNS:  Blood pressure (!) 124/50, pulse 80, temperature 97.6 F (36.4 C), temperature source Oral, resp. rate (!) 27, height 5' (1.524 m), weight 45.4 kg (100 lb), last menstrual period 02/25/1995, SpO2 95 %.  PHYSICAL EXAMINATION:  Physical Exam  GENERAL:  51 y.o.-year-old patient lying in the bed with respiratory distress. Looks critically ill. EYES: Pupils equal, round, reactive to light and accommodation. No scleral icterus. Extraocular muscles intact.  HEENT: Head atraumatic, normocephalic. Oropharynx and nasopharynx clear. No oropharyngeal erythema, moist oral mucosa  NECK:  Supple, no jugular venous distention. No thyroid enlargement, no tenderness.  LUNGS: Bilateral coarse breath sounds and  wheezing. CARDIOVASCULAR: S1, S2 normal. No murmurs, rubs, or gallops.  ABDOMEN: Soft, nontender, nondistended. Bowel sounds present. No organomegaly or mass.  EXTREMITIES: No pedal edema, cyanosis, or clubbing. + 2 pedal & radial pulses b/l.   NEUROLOGIC: Cranial nerves II through XII are intact. No focal Motor or sensory deficits appreciated b/l PSYCHIATRIC: The patient is alert and awake  LABORATORY PANEL:   CBC  Recent Labs Lab 04/01/16 0800  WBC 6.4  HGB 8.8*  HCT 26.9*  PLT 104*   ------------------------------------------------------------------------------------------------------------------  Chemistries   Recent Labs Lab 04/01/16 0800  NA 133*  K 6.2*  CL 92*  CO2 16*  GLUCOSE 137*  BUN 69*  CREATININE 5.98*  CALCIUM 8.9  AST 50*  ALT 12*  ALKPHOS 238*  BILITOT 1.2   ------------------------------------------------------------------------------------------------------------------  Cardiac Enzymes  Recent Labs Lab 04/01/16 0800  TROPONINI 0.04*   ------------------------------------------------------------------------------------------------------------------  RADIOLOGY:  Dg Chest 2 View  Result Date: 04/01/2016 CLINICAL DATA:  Cough for 1 week, back pain, Port-A-Cath removed yesterday, multiple myeloma EXAM: CHEST  2 VIEW COMPARISON:  Portable chest x-ray of 01/14/2016 FINDINGS: The left central venous line noted previously is no longer present. A right-sided  Port-A-Cath remains with tip overlying the expected SVC-RA junction. The lungs are poorly aerated and there does appear to be pulmonary vascular congestion present with small effusions. Mild cardiomegaly is stable. An opacity remains overlying the left heart shadow which may be due to atelectasis or congestion but pneumonia cannot be excluded. IMPRESSION: 1. Poor inspiration with mild CHF with small effusions. 2. Right-sided Port-A-Cath tip overlies the expected SVC -RA junction. 3. Prior left  central venous line has been removed. Electronically Signed   By: Ivar Drape M.D.   On: 04/01/2016 08:02     IMPRESSION AND PLAN:   * Hyperkalemia with end-stage renal disease Will need urgent hemodialysis. Discussed with Dr. Candiss Norse of nephrology. Patient will be admitted as inpatient.  * Sepsis with pneumonitis High suspicion for influenza. We'll check influenza A and B. Start Tamiflu. Levaquin. No IV fluids due to fluid overload in spite of having elevated lactic acid.  * Multiple myeloma Follows up at [place] as outpatient  * Diabetes mellitus Not on any home medications. Start sliding scale insulin.  * DVT prophylaxis with heparin  All the records are reviewed and case discussed with ED provider. Management plans discussed with the patient, family and they are in agreement.  TOTAL CC TIME TAKING CARE OF THIS PATIENT: 40 minutes.   Hillary Bow R M.D on 04/01/2016 at 12:57 PM  Between 7am to 6pm - Pager - [phone]  After 6pm go to [email or web address] - password [email or web address] [facility]  Office  [phone]  CC: Primary care physician; [name], MD  Note: This dictation was prepared with Dragon dictation along with smaller phrase technology. Any transcriptional errors that result from this process are unintentional.

## 2016-04-01 NOTE — Progress Notes (Signed)
Post hd vitals 

## 2016-04-01 NOTE — ED Notes (Signed)
Pt reports she's unable to urinate.

## 2016-04-01 NOTE — Progress Notes (Signed)
Subjective:   Patient known to our practice from outpatient dialysis.  She presents to the emergency room with her husband.  They report that she has had this severe cough since last night.  She was not able to sleep.  She has tested positive for influenza in the emergency room.she is currently being admitted for further evaluation and management. Workup so far shows that she had hyperkalemia with potassium of 6.2.  Urgent nephrology consult was requested for evaluation for dialysis.  Objective:  Vital signs in last 24 hours:  Temp:  [97.6 F (36.4 C)] 97.6 F (36.4 C) (01/25 0642) Pulse Rate:  [67-84] 80 (01/25 1430) Resp:  [15-29] 26 (01/25 1430) BP: (92-143)/(46-78) 114/56 (01/25 1430) SpO2:  [94 %-99 %] 98 % (01/25 1430) Weight:  [45.4 kg (100 lb)] 45.4 kg (100 lb) (01/25 0648)  Weight change:  Filed Weights   04/01/16 0648  Weight: 45.4 kg (100 lb)    Intake/Output:   No intake or output data in the 24 hours ending 04/01/16 1504   Physical Exam: General: Thin, cachectic,sitting up in bed  HEENT Pale conjunctiva, moist oral mucous membranes  Neck Supple, JVD present  Pulm/lungs Bilateral crackles, mild diffuse wheezing  CVS/Heart Tachycardic,+ gallop  Abdomen:  Soft, nontender, nondistended  Extremities: + pitting edema  Neurologic: Alert, able to answer questions  Skin: No acute rashes  Access: Left arm AV fistula       Basic Metabolic Panel:   Recent Labs Lab 03/30/16 0857 04/01/16 0800  NA 134* 133*  K 4.0 6.2*  CL 95* 92*  CO2 28 16*  GLUCOSE 180* 137*  BUN 29* 69*  CREATININE 3.60* 5.98*  CALCIUM 8.7* 8.9     CBC:  Recent Labs Lab 03/30/16 0857 04/01/16 0800  WBC 3.7 6.4  NEUTROABS 2.5 5.2  HGB 8.5* 8.8*  HCT 25.2* 26.9*  MCV 94.8 97.2  PLT 130* 104*      Microbiology:  No results found for this or any previous visit (from the past 720 hour(s)).  Coagulation Studies: No results for input(s): LABPROT, INR in the last 72  hours.  Urinalysis: No results for input(s): COLORURINE, LABSPEC, PHURINE, GLUCOSEU, HGBUR, BILIRUBINUR, KETONESUR, PROTEINUR, UROBILINOGEN, NITRITE, LEUKOCYTESUR in the last 72 hours.  Invalid input(s): APPERANCEUR    Imaging: Dg Chest 2 View  Result Date: 04/01/2016 CLINICAL DATA:  Cough for 1 week, back pain, Port-A-Cath removed yesterday, multiple myeloma EXAM: CHEST  2 VIEW COMPARISON:  Portable chest x-ray of 01/14/2016 FINDINGS: The left central venous line noted previously is no longer present. A right-sided Port-A-Cath remains with tip overlying the expected SVC-RA junction. The lungs are poorly aerated and there does appear to be pulmonary vascular congestion present with small effusions. Mild cardiomegaly is stable. An opacity remains overlying the left heart shadow which may be due to atelectasis or congestion but pneumonia cannot be excluded. IMPRESSION: 1. Poor inspiration with mild CHF with small effusions. 2. Right-sided Port-A-Cath tip overlies the expected SVC -RA junction. 3. Prior left central venous line has been removed. Electronically Signed   By: Ivar Drape M.D.   On: 04/01/2016 08:02     Medications:   . sodium chloride Stopped (04/01/16 1455)   . citalopram  20 mg Oral Daily  . heparin  5,000 Units Subcutaneous Q8H  . insulin aspart  0-9 Units Subcutaneous TID WC  . levofloxacin  250 mg Oral Q48H  . methylPREDNISolone (SOLU-MEDROL) injection  60 mg Intravenous BID  . oseltamivir  30  mg Oral BID  . sodium chloride flush  3 mL Intravenous Q12H  . sodium chloride flush  3 mL Intravenous Q12H   sodium chloride, acetaminophen **OR** acetaminophen, albuterol, benzonatate, bisacodyl, HYDROcodone-acetaminophen, hydrOXYzine, ondansetron **OR** ondansetron (ZOFRAN) IV, polyethylene glycol, sodium chloride flush  Assessment/ Plan:  51 y.o. female with diabetes mellitus type 2, hypertension, multiple myeloma on chemotherapy, osteoarthritis, ESRD on HD, anemia of CKD,  SHPTH  [facility]/MWF3/[address]  1.  End-stage renal disease on hemodialysis MWF:  2.  Hyperkalemia 3.  Anemia of chronic kidney disease. Hemoglobin 8.8 4. Secondary hyperparathyroidism. Check intact PTH and phosphorus with dialysis today. 5. Influenza B 6.  Volume overload, mild pulmonary edema  Plan: Urgent hemodialysis for hyperkalemia.  However treatment was slightly delayed because of hospital policy of isolation for flu + patients. Patient will be dialyzed when she get into her room.  Ultrafiltration goal of 2-2.5 L Further recommendations as hospital course progresses  LOS 0 Blaire Palomino 1/25/20183:04 PM

## 2016-04-01 NOTE — Progress Notes (Signed)
Pre dialysis  

## 2016-04-01 NOTE — Progress Notes (Signed)
Waiting on negative flu results so pt can be brought to HD unit. Candiss Norse, MD aware.

## 2016-04-01 NOTE — ED Notes (Addendum)
Spoke with Primedoc Sudini in regards to dialysis unable to take patient due to being on droplet precautions; he is going to follow up with dialysis MD (Sing). Also informed Sudini that pt's BP is trending downward, now 92/50, no new orders received. Sudini reports pt's BP is normally low (systolic [id number]).

## 2016-04-01 NOTE — ED Notes (Signed)
Dr Darvin Neighbours called back to report pt would receive dialysis at the bedside due to being on droplet precautions. Dialysis RN aware.

## 2016-04-01 NOTE — ED Notes (Signed)
Carmell Austria, RN notified of patient with positive flu and will need bedside dialysis.

## 2016-04-01 NOTE — ED Notes (Signed)
Report given to Carmell Austria, RN in dialysis. Pt unable to come to dialysis due to being on droplet precautions. Pt's BP also trending downward. Primedoc Sudini paged.

## 2016-04-01 NOTE — ED Notes (Signed)
Pt's oxygen sat at 92% RA, placed on 2L via [place].

## 2016-04-01 NOTE — ED Triage Notes (Signed)
Pt ambulatory to triage with steady gait. Pt c/o cough x1 day that has worsened over the night and made it hard for her to breath. Pt reports she is currently taking chemotherapy for multiple myeloma on T/W, Dialysis on M/W/F. Pt denies fever or other symptoms.

## 2016-04-01 NOTE — Progress Notes (Signed)
Pre HD  

## 2016-04-01 NOTE — Progress Notes (Signed)
Post hd assessment 

## 2016-04-02 DIAGNOSIS — J101 Influenza due to other identified influenza virus with other respiratory manifestations: Secondary | ICD-10-CM | POA: Diagnosis present

## 2016-04-02 LAB — HEMOGLOBIN A1C
HEMOGLOBIN A1C: 6.1 % — AB (ref 4.8–5.6)
MEAN PLASMA GLUCOSE: 128 mg/dL

## 2016-04-02 LAB — BASIC METABOLIC PANEL
Anion gap: 13 (ref 5–15)
BUN: 38 mg/dL — AB (ref 6–20)
CALCIUM: 8.6 mg/dL — AB (ref 8.9–10.3)
CO2: 29 mmol/L (ref 22–32)
CREATININE: 3.07 mg/dL — AB (ref 0.44–1.00)
Chloride: 95 mmol/L — ABNORMAL LOW (ref 101–111)
GFR calc Af Amer: 19 mL/min — ABNORMAL LOW (ref >60)
GFR, EST NON AFRICAN AMERICAN: 17 mL/min — AB (ref >60)
GLUCOSE: 233 mg/dL — AB (ref 65–99)
Potassium: 4.5 mmol/L (ref 3.5–5.1)
SODIUM: 137 mmol/L (ref 135–145)

## 2016-04-02 LAB — GLUCOSE, CAPILLARY
GLUCOSE-CAPILLARY: 210 mg/dL — AB (ref 65–99)
GLUCOSE-CAPILLARY: 378 mg/dL — AB (ref 65–99)

## 2016-04-02 LAB — CBC
HCT: 25.8 % — ABNORMAL LOW (ref 35.0–47.0)
Hemoglobin: 8.7 g/dL — ABNORMAL LOW (ref 12.0–16.0)
MCH: 31.8 pg (ref 26.0–34.0)
MCHC: 33.5 g/dL (ref 32.0–36.0)
MCV: 95 fL (ref 80.0–100.0)
PLATELETS: 92 10*3/uL — AB (ref 150–440)
RBC: 2.72 MIL/uL — ABNORMAL LOW (ref 3.80–5.20)
RDW: 21.8 % — AB (ref 11.5–14.5)
WBC: 7.1 10*3/uL (ref 3.6–11.0)

## 2016-04-02 LAB — HEPATITIS B SURFACE ANTIGEN: HEP B S AG: NEGATIVE

## 2016-04-02 LAB — HEPATITIS B SURFACE ANTIBODY, QUANTITATIVE

## 2016-04-02 MED ORDER — OSELTAMIVIR PHOSPHATE 30 MG PO CAPS: 30.0000 mg | ORAL_CAPSULE | Freq: Two times a day (BID) | ORAL | 0 refills | Status: DC

## 2016-04-02 MED ORDER — HEPARIN SOD (PORK) LOCK FLUSH 100 UNIT/ML IV SOLN
500.0000 [IU] | Freq: Once | INTRAVENOUS | Status: AC
  Administered 2016-04-02: 500 [IU] via INTRAVENOUS
  Filled 2016-04-02 (×2): qty 5

## 2016-04-02 MED ORDER — INSULIN PEN NEEDLE 30G X 8 MM MISC: 1.0000 | 0 refills | Status: AC | PRN

## 2016-04-02 MED ORDER — LEVOFLOXACIN 250 MG PO TABS: 250.0000 mg | ORAL_TABLET | ORAL | 0 refills | Status: DC

## 2016-04-02 MED ORDER — NEPRO/CARBSTEADY PO LIQD: 237.0000 mL | Freq: Two times a day (BID) | ORAL | Status: DC

## 2016-04-02 MED ORDER — PREDNISONE 50 MG PO TABS: 50.0000 mg | ORAL_TABLET | Freq: Every day | ORAL | 0 refills | Status: DC

## 2016-04-02 MED ORDER — OSELTAMIVIR PHOSPHATE 30 MG PO CAPS: 30.0000 mg | ORAL_CAPSULE | Freq: Every day | ORAL | Status: DC

## 2016-04-02 NOTE — Progress Notes (Signed)
Pt refused bed alarm 

## 2016-04-02 NOTE — Care Management (Signed)
Self Pay.  Goes to [facility].  [place].  Patient has been informed about [place] and Medication Management on previous admissions.  Patient chronic HD patient.  HD info faxed to [facility]  HD liaison.   Patient to discharge to day and go to her regular scheduled outpatient HD time at 230 pm.  Family to transport.  RNCM signing off

## 2016-04-02 NOTE — Discharge Instructions (Signed)
Resume diet and activity as before.  Continue hemodialysis.  To prevent spread of Influenza Cover your cough. Wash hands frequently.

## 2016-04-02 NOTE — Progress Notes (Signed)
Inpatient Diabetes Program Recommendations  AACE/ADA: New Consensus Statement on Inpatient Glycemic Control (2015)  Target Ranges:  Prepandial:   less than 140 mg/dL      Peak postprandial:   less than 180 mg/dL (1-2 hours)      Critically ill patients:  140 - 180 mg/dL   Results for SEDONA, MATSON (MRN [id number]) as of 04/02/2016 10:47  Ref. Range 04/01/2016 21:45 04/02/2016 07:41  Glucose-Capillary Latest Ref Range: 65 - 99 mg/dL 231 (H) 210 (H)   Review of Glycemic Control  Outpatient Diabetes medications:Levemir 5 units QHS Current orders for Inpatient glycemic control: Novolog 0-9 units TID with meals  Inpatient Diabetes Program Recommendations: Insulin - Basal: Please consider ordering Levemir 5 units QHS. Correction (SSI): Please consider ordering Novolog bedtime correction scale.  Thanks, Barnie Alderman, RN, MSN, CDE Diabetes Coordinator Inpatient Diabetes Program [phone] (Team Pager from 8am to 5pm)

## 2016-04-02 NOTE — Progress Notes (Signed)
Alert and oriented. Vital signs stable . No signs of acute distress. Discharge instructions given with interpreter. Patient verbalized understanding. No other issues noted at this time.

## 2016-04-02 NOTE — Progress Notes (Signed)
Nutrition Brief Note  Patient identified on the Malnutrition Screening Tool (MST) Report  Wt Readings from Last 15 Encounters:  04/02/16 103 lb 6.4 oz (46.9 kg)  03/31/16 100 lb (45.4 kg)  03/30/16 100 lb 1.4 oz (45.4 kg)  03/17/16 100 lb (45.4 kg)  03/16/16 98 lb 15.8 oz (44.9 kg)  03/09/16 104 lb 6.2 oz (47.3 kg)  03/04/16 100 lb (45.4 kg)  03/02/16 102 lb 13.5 oz (46.6 kg)  02/20/16 100 lb (45.4 kg)  02/17/16 100 lb (45.4 kg)  02/17/16 99 lb 3.3 oz (45 kg)  02/12/16 100 lb 6.4 oz (45.5 kg)  02/10/16 99 lb 8.6 oz (45.1 kg)  02/03/16 100 lb 15.5 oz (45.8 kg)  01/20/16 110 lb 9 oz (50.1 kg)   51 y.o. 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  Patient is Spanish-speaking, interpreter present at time of nutrition assessment. Patient reports her appetite has been poor for the past 5 months. She is eating 3 meals per day that consists of foods such as meat or beans, tortillas, and eggs. She also reports drinking Glucerna 1-2 times per day. Patient reports she used to be able to eat more than this before her appetite became poor. She reports she has been losing weight. UBW was 125 lbs and patient reports she has lost 22 lbs (17.6% body weight) over 5 months, which is significant for time frame. Limited weight history in chart. Discussed that Nepro also has Carb Steady (why pt drinks Glucerna) but also has more calories and protein. Patient amenable to trying prior to discharge today.  Nutrition intervention:  Recommended Nepro Shake po BID, each supplement provides 425 kcal and 19 grams protein. RD brought patient Nepro to try prior to d/c.  Also recommended small, frequent meals in setting of poor appetite. Discussed snacks patient can have between meals with adequate calories and protein.   Body mass index is  20.19 kg/m. Patient meets criteria for Normal Weight based on current BMI.   Nutrition-Focused physical exam completed. Findings are severe fat depletion, severe muscle depletion, and no edema.   Patient meets criteria for severe chronic malnutrition in setting of 17.6% weight loss over 5 months, severe fat depletion, severe muscle depletion.  Current diet order is Renal with 1.2 L Fluid Restriction. Labs and medications reviewed.    Willey Blade, MS, RD, LDN Pager: [phone] After Hours Pager: [phone]

## 2016-04-02 NOTE — Progress Notes (Signed)
Subjective:   Exam patient with interpreter today. States she is feeling better. She experienced sustained decreased shortness of breath. Denies nausea vomiting. Currently on room air  Tolerated dialysis yesterday with the UF= 2000cc  Objective:  Vital signs in last 24 hours:  Temp:  [97.6 F (36.4 C)-98.1 F (36.7 C)] 98 F (36.7 C) (01/26 0740) Pulse Rate:  [78-93] 83 (01/26 0740) Resp:  [12-22] 17 (01/26 0740) BP: (124-165)/(53-88) 138/68 (01/26 0740) SpO2:  [95 %-100 %] 96 % (01/26 0740) Weight:  [45.3 kg (99 lb 13.9 oz)-46.9 kg (103 lb 6.4 oz)] 46.9 kg (103 lb 6.4 oz) (01/26 0509)  Weight change: -0.06 kg (-2.1 oz) Filed Weights   04/01/16 0648 04/01/16 1503 04/02/16 0509  Weight: 45.4 kg (100 lb) 45.3 kg (99 lb 13.9 oz) 46.9 kg (103 lb 6.4 oz)    Intake/Output:    Intake/Output Summary (Last 24 hours) at 04/02/16 1456 Last data filed at 04/02/16 0500  Gross per 24 hour  Intake              243 ml  Output             2000 ml  Net            -1757 ml     Physical Exam: General: Thin, cachectic,sitting up in chair  HEENT Pale conjunctiva, moist oral mucous membranes  Neck Supple  Pulm/lungs mild crackles  CVS/Heart Tachycardic,+ gallop  Abdomen:  Soft, nontender, nondistended  Extremities: trace edema  Neurologic: Alert, able to answer questions  Skin: No acute rashes  Access: Left arm AV fistula       Basic Metabolic Panel:   Recent Labs Lab 03/30/16 0857 04/01/16 0800 04/02/16 0457  NA 134* 133* 137  K 4.0 6.2* 4.5  CL 95* 92* 95*  CO2 28 16* 29  GLUCOSE 180* 137* 233*  BUN 29* 69* 38*  CREATININE 3.60* 5.98* 3.07*  CALCIUM 8.7* 8.9 8.6*     CBC:  Recent Labs Lab 03/30/16 0857 04/01/16 0800 04/02/16 0457  WBC 3.7 6.4 7.1  NEUTROABS 2.5 5.2  --   HGB 8.5* 8.8* 8.7*  HCT 25.2* 26.9* 25.8*  MCV 94.8 97.2 95.0  PLT 130* 104* 92*      Microbiology:  Recent Results (from the past 720 hour(s))  Blood culture (routine x 2)      Status: None (Preliminary result)   Collection Time: 04/01/16  9:12 AM  Result Value Ref Range Status   Specimen Description BLOOD RIGHT PORTA CATH  Final   Special Requests   Final    BOTTLES DRAWN AEROBIC AND ANAEROBIC AER 9 ML ANA 7 ML   Culture NO GROWTH < 24 HOURS  Final   Report Status PENDING  Incomplete  Blood culture (routine x 2)     Status: None (Preliminary result)   Collection Time: 04/01/16  9:12 AM  Result Value Ref Range Status   Specimen Description BLOOD RIGHT ARM  Final   Special Requests   Final    BOTTLES DRAWN AEROBIC AND ANAEROBIC AER 8 ML ANA8 ML   Culture NO GROWTH < 24 HOURS  Final   Report Status PENDING  Incomplete  MRSA PCR Screening     Status: None   Collection Time: 04/01/16  7:47 PM  Result Value Ref Range Status   MRSA by PCR NEGATIVE NEGATIVE Final    Comment:        The GeneXpert MRSA Assay (FDA approved for NASAL specimens  only), is one component of a comprehensive MRSA colonization surveillance program. It is not intended to diagnose MRSA infection nor to guide or monitor treatment for MRSA infections.     Coagulation Studies: No results for input(s): LABPROT, INR in the last 72 hours.  Urinalysis: No results for input(s): COLORURINE, LABSPEC, PHURINE, GLUCOSEU, HGBUR, BILIRUBINUR, KETONESUR, PROTEINUR, UROBILINOGEN, NITRITE, LEUKOCYTESUR in the last 72 hours.  Invalid input(s): APPERANCEUR    Imaging: Dg Chest 2 View  Result Date: 04/01/2016 CLINICAL DATA:  Cough for 1 week, back pain, Port-A-Cath removed yesterday, multiple myeloma EXAM: CHEST  2 VIEW COMPARISON:  Portable chest x-ray of 01/14/2016 FINDINGS: The left central venous line noted previously is no longer present. A right-sided Port-A-Cath remains with tip overlying the expected SVC-RA junction. The lungs are poorly aerated and there does appear to be pulmonary vascular congestion present with small effusions. Mild cardiomegaly is stable. An opacity remains overlying the  left heart shadow which may be due to atelectasis or congestion but pneumonia cannot be excluded. IMPRESSION: 1. Poor inspiration with mild CHF with small effusions. 2. Right-sided Port-A-Cath tip overlies the expected SVC -RA junction. 3. Prior left central venous line has been removed. Electronically Signed   By: Ivar Drape M.D.   On: 04/01/2016 08:02     Medications:    . citalopram  20 mg Oral Daily  . feeding supplement (NEPRO CARB STEADY)  237 mL Oral BID BM  . heparin  5,000 Units Subcutaneous Q8H  . insulin aspart  0-9 Units Subcutaneous TID WC  . levofloxacin  250 mg Oral Q48H  . methylPREDNISolone (SOLU-MEDROL) injection  60 mg Intravenous BID  . [START ON 04/03/2016] oseltamivir  30 mg Oral q1800  . sodium chloride flush  3 mL Intravenous Q12H  . sodium chloride flush  3 mL Intravenous Q12H   sodium chloride, acetaminophen **OR** acetaminophen, albuterol, benzonatate, bisacodyl, HYDROcodone-acetaminophen, hydrOXYzine, ondansetron **OR** ondansetron (ZOFRAN) IV, polyethylene glycol, sodium chloride flush  Assessment/ Plan:  51 y.o. female with diabetes mellitus type 2, hypertension, multiple myeloma on chemotherapy, osteoarthritis, ESRD on HD, anemia of CKD, SHPTH  [facility]/MWF3/[address]  1.  End-stage renal disease on hemodialysis MWF:  2.  Hyperkalemia 3.  Anemia of chronic kidney disease. Hemoglobin 8.7 4. Secondary hyperparathyroidism. Check intact PTH and phosphorus with dialysis today. 5. Influenza B 6.  Volume overload, mild pulmonary edema  Plan: Discharge home followed by dialysis this afternoon regularly scheduled time. This was compared to the dialysis unit. Patient's family to provide transportation to and from unit.  LOS 1 Tereza Gilham 1/26/20182:56 PM

## 2016-04-02 NOTE — Progress Notes (Signed)
Order for Tamiflu changed from 30 mg PO BID to 30 mg PO daily per renal function. Per protocol.  Lenis Noon, PharmD Clinical Pharmacist 04/02/16 11:27 AM

## 2016-04-05 NOTE — Care Management (Signed)
Malachy Mood with [employer] questions whether patient discharged Friday.  Updated cheryl that patient did discharge 1.26.2018.  There is no discharge summary present yet

## 2016-04-05 NOTE — Progress Notes (Signed)
[facility]  [phone] [phone]  ID: [name] OB: [birth year]  MR#: [id number]  TKW#:[id number]  [name], MD as PCP - General (Family Medicine)  CHIEF COMPLAINT: Multiple Myeloma in relapse with multiple bony lytic lesions, now with end-stage renal disease on dialysis.   INTERVAL HISTORY: Patient returns to clinic today for further evaluation and consideration of cycle 4, day 8 of Kyprolis. She continues to feel weak and slightly fatigued, and feels that it has improved from previous. She does not complain of rash or pruritic skin today. She denies fever or chills.  She reports a "so-so" appetite, but reports eating moderately, and no weight loss. She has numbness and tingling in all her fingers and both feet.  She continues to have chronic back pain, in the area of the kidneys, reported as 8/10 sharp pain today, minimally relieved with current pain medications. She reports shortness of breath with coughing, no chest pain.  She complains of mild nausea, denies vomiting, constipation, or diarrhea. She denies hematuria or hematochezia. She reports having very ittle urinary output, currently on dialysis.  She reports feeling anxious and having trouble sleeping.  Patient offers no further specific complaints today.   REVIEW OF SYSTEMS:   Review of Systems  Constitutional: Positive for malaise/fatigue. Negative for chills, fever and weight loss.  HENT: Negative for congestion.   Respiratory: Positive for cough and shortness of breath.   Cardiovascular: Negative.  Negative for chest pain and leg swelling.  Gastrointestinal: Positive for nausea. Negative for abdominal pain, blood in stool, constipation, diarrhea and vomiting.  Genitourinary: Negative for hematuria.       On dialysis  Musculoskeletal: Positive for back pain.  Skin: Negative for itching and rash.  Neurological: Positive for tingling, sensory  change and weakness. Negative for focal weakness and headaches.  Psychiatric/Behavioral: The patient is nervous/anxious and has insomnia.    As per HPI. Otherwise, a complete review of systems is negative.   PAST MEDICAL HISTORY: Past Medical History:  Diagnosis Date  . Anemia   . Anxiety   . Chronic kidney disease    DIALYSIS  . Depression   . Diabetes mellitus without complication ([place])   . Dialysis patient ([place])    Mon, Wed, Fri  . History of chemotherapy    chemo on Tuesday and Wednesday  . Hypertension   . Multiple myeloma ([place])   . Neuropathy associated with cancer ([place])   . Osteoarthritis   . Pancreatitis   . Pneumonia    September 2017, [facility]  . Post-menopausal 2014  . Sepsis ([place])     PAST SURGICAL HISTORY: Past Surgical History:  Procedure Laterality Date  . AV FISTULA PLACEMENT Left 02/20/2016   Procedure: INSERTION OF ARTERIOVENOUS (AV) GORE-TEX GRAFT ARM;  Surgeon: Katha Cabal, MD;  Location: [facility] ORS;  Service: Vascular;  Laterality: Left;  . CESAREAN SECTION     x2  . dialysis catheter placement    . PERIPHERAL VASCULAR CATHETERIZATION N/A 11/19/2015   Procedure: Dialysis/Perma Catheter Insertion;  Surgeon: Algernon Huxley, MD;  Location: [place] [facility];  Service: Cardiovascular;  Laterality: N/A;  . PERIPHERAL VASCULAR CATHETERIZATION Left 03/10/2016   Procedure: Thrombectomy;  Surgeon: Katha Cabal, MD;  Location: [place] [facility];  Service: Cardiovascular;  Laterality: Left;  . PORTACATH PLACEMENT    . TUBAL LIGATION      FAMILY HISTORY: Reviewed and unchanged. No reported history of malignancy or chronic disease.  ADVANCED DIRECTIVES:    HEALTH MAINTENANCE: Social History  Substance Use Topics  . Smoking status: Never Smoker  . Smokeless tobacco: Never Used  . Alcohol use No     No Known Allergies  Current Outpatient Prescriptions  Medication Sig Dispense Refill  . acetaminophen (TYLENOL) 500 MG tablet Take 500 mg by  mouth every 6 (six) hours as needed.    . citalopram (CELEXA) 20 MG tablet Take 1 tablet (20 mg total) by mouth daily. 30 tablet 5  . HYDROcodone-acetaminophen (NORCO) 5-325 MG tablet Take 1-2 tablets by mouth every 6 (six) hours as needed for moderate pain or severe pain. 50 tablet 0  . Insulin Detemir (LEVEMIR FLEXPEN) 100 UNIT/ML Pen Inject 5 Units into the skin daily at 10 pm.    . VERAPAMIL HCL ER PO Take 120 mg by mouth daily.    Marland Kitchen dronabinol (MARINOL) 2.5 MG capsule Take 1 capsule (2.5 mg total) by mouth 2 (two) times daily before a meal. (Patient not taking: Reported on 03/16/2016) 60 capsule 0  . ferrous sulfate 325 (65 FE) MG tablet Take 325 mg by mouth daily with breakfast.    . furosemide (LASIX) 80 MG tablet Take 1 tablet (80 mg total) by mouth daily. (Patient not taking: Reported on 03/16/2016) 30 tablet 0  . promethazine (PHENERGAN) 25 MG tablet Take 25 mg by mouth every 6 (six) hours as needed for nausea or vomiting.     No current facility-administered medications for this visit.    Facility-Administered Medications Ordered in Other Visits  Medication Dose Route Frequency Provider Last Rate Last Dose  . heparin lock flush 100 unit/mL  500 Units Intravenous Once Lloyd Huger, MD      . sodium chloride flush (NS) 0.9 % injection 10 mL  10 mL Intravenous Once Lloyd Huger, MD        OBJECTIVE: Vitals:   03/16/16 0937  BP: (!) 144/69  Pulse: 98  Resp: 18  Temp: 97.5 F (36.4 C)     Body mass index is 19.33 kg/m.    ECOG FS:1 - Symptomatic  General: No acute distress. Eyes: anicteric sclera. Lungs: Clear to auscultation bilaterally. Heart: Regular rate and rhythm. No rubs, murmurs, or gallops. Abdomen: Soft, nontender, nondistended. No organomegaly noted, normoactive bowel sounds. Musculoskeletal: No edema, cyanosis, or clubbing. Neuro: Alert, answering all questions appropriately. Cranial nerves grossly intact. Skin: Jaundiced. No rashes or petechiae noted.    Psych: Normal affect.   LAB RESULTS:  Lab Results  Component Value Date   NA 135 03/16/2016   K 3.9 03/16/2016   CL 97 (L) 03/16/2016   CO2 27 03/16/2016   GLUCOSE 166 (H) 03/16/2016   BUN 25 (H) 03/16/2016   CREATININE 3.31 (H) 03/16/2016   CALCIUM 9.2 03/16/2016   PROT 6.2 (L) 03/16/2016   ALBUMIN 2.7 (L) 03/16/2016   AST 29 03/16/2016   ALT 10 (L) 03/16/2016   ALKPHOS 167 (H) 03/16/2016   BILITOT 1.3 (H) 03/16/2016   GFRNONAA 15 (L) 03/16/2016   GFRAA 17 (L) 03/16/2016    Lab Results  Component Value Date   WBC 6.0 03/16/2016   NEUTROABS 4.0 03/16/2016   HGB 9.2 (L) 03/16/2016   HCT 27.6 (L) 03/16/2016   MCV 93.6 03/16/2016   PLT 136 (L) 03/16/2016   Lab Results  Component Value Date   TOTALPROTELP 5.4 (L) 03/09/2016   ALBUMINELP 2.8 (L) 03/09/2016   A1GS 0.3 03/09/2016   A2GS 0.8 03/09/2016   BETS  0.9 03/09/2016   GAMS 0.4 03/09/2016   MSPIKE 0.4 (H) 03/09/2016   SPEI Comment 03/09/2016     STUDIES: No results found.  ASSESSMENT: Multiple Myeloma in relapse with multiple bony lytic lesions, end-stage renal disease.   PLAN:    1. Multiple Myeloma in relapse with multiple bony lytic lesions: MRI, CT, bone scan results reviewed independently indicating progression of disease. Patient also has progressed to end-stage renal disease, likely secondary to underlying myeloma. Patient's kappa free light chain has most recently trended up, along with her ratio. M spike has been stable at 0.4. Today's results are pending. Proceed with cycle 4, day 8 of Kyprolis. Given patient's decreased performance status, will not increase dose of Kyprolis. Patient is receiving receive treatment on days 1, 2, 8, 9, 15, and 16. Kyprolis does not need to be dose reduced in the setting of end-stage renal disease and dialysis. Can consider Delton See given her renal failure. Dexamethasone has been discontinued given her difficulty controllingl blood sugar. Because of patient's immigration  status, she could not undergo transplant. Return to clinic tomorrow for treatment and in 1 week for consideration of cycle 4, day 15. 2. Back pain: Pain persists. Continue current narcotics as prescribed.  Take oxycodone as prescribed for severe breakthrough pain. 3. End-stage renal disease: Likely secondary to progression of disease. Continue dialysis on Monday, Wednesdays and Fridays. Has fistula. 4. Anemia: Hemoglobin decreased. Patient does not require transfusion at this time.  5. Hyperglycemia: Patient's blood glucose has improved since initiating insulin. Continue monitoring and treatment by primary care. Continue to hold dexamethasone as above. 6. Thrombocytopenia: Multifactorial secondary to treatment as well as disease progression. Improved. 7. Depression: Continue Celexa 20 mg daily. 8. Weight Loss: Stable without medication. Patient stopped taking Marinol due to making her "feel funny". She states that she has an improved appetite without the medication. Will continue to monitor. 9. Constipation: Resolved. Take OTC medications if needed. Monitor.  10. Pruritus: Resolved. Patient states Benadryl works, but only for several hours. Continue Atarax if needed. 11. Insomnia/Anxiety: 4-7-8 breathing technique at bedtime: breathe in to count of 4, hold breath for count of 7, exhale for count of 8; do 3-5 times for letting go of overactive thoughts.  Previously was provided breath ratio chart to patient for relaxing, balancing, and energizing breathing techniques.  Interpreter present during entire clinic visit via telephone.  Patient expressed understanding and was in agreement with this plan. She also understands that She can call clinic at any time with any questions, concerns, or complaints.   Lucendia Herrlich, NP  04/06/16 7:51 PM   Patient was seen and evaluated independently and I agree with the assessment and plan as dictated above. Patient's kappa free light chains are trending down  again and below 12,000. Her light chain ratio is also decreased at 781.  Lloyd Huger, MD 04/09/16 9:12 AM

## 2016-04-06 ENCOUNTER — Inpatient Hospital Stay: Payer: Self-pay

## 2016-04-06 ENCOUNTER — Inpatient Hospital Stay (HOSPITAL_BASED_OUTPATIENT_CLINIC_OR_DEPARTMENT_OTHER): Payer: Self-pay | Admitting: Oncology

## 2016-04-06 VITALS — BP 145/86 | HR 89 | Temp 97.5°F | Resp 18 | Wt 101.7 lb

## 2016-04-06 DIAGNOSIS — Z794 Long term (current) use of insulin: Secondary | ICD-10-CM

## 2016-04-06 DIAGNOSIS — Z8701 Personal history of pneumonia (recurrent): Secondary | ICD-10-CM

## 2016-04-06 DIAGNOSIS — E1165 Type 2 diabetes mellitus with hyperglycemia: Secondary | ICD-10-CM

## 2016-04-06 DIAGNOSIS — D649 Anemia, unspecified: Secondary | ICD-10-CM

## 2016-04-06 DIAGNOSIS — I129 Hypertensive chronic kidney disease with stage 1 through stage 4 chronic kidney disease, or unspecified chronic kidney disease: Secondary | ICD-10-CM

## 2016-04-06 DIAGNOSIS — F329 Major depressive disorder, single episode, unspecified: Secondary | ICD-10-CM

## 2016-04-06 DIAGNOSIS — G549 Nerve root and plexus disorder, unspecified: Secondary | ICD-10-CM

## 2016-04-06 DIAGNOSIS — G629 Polyneuropathy, unspecified: Secondary | ICD-10-CM

## 2016-04-06 DIAGNOSIS — Z8619 Personal history of other infectious and parasitic diseases: Secondary | ICD-10-CM

## 2016-04-06 DIAGNOSIS — F419 Anxiety disorder, unspecified: Secondary | ICD-10-CM

## 2016-04-06 DIAGNOSIS — M1990 Unspecified osteoarthritis, unspecified site: Secondary | ICD-10-CM

## 2016-04-06 DIAGNOSIS — Z992 Dependence on renal dialysis: Secondary | ICD-10-CM

## 2016-04-06 DIAGNOSIS — N186 End stage renal disease: Secondary | ICD-10-CM

## 2016-04-06 DIAGNOSIS — R112 Nausea with vomiting, unspecified: Secondary | ICD-10-CM

## 2016-04-06 DIAGNOSIS — R531 Weakness: Secondary | ICD-10-CM

## 2016-04-06 DIAGNOSIS — R140 Abdominal distension (gaseous): Secondary | ICD-10-CM

## 2016-04-06 DIAGNOSIS — G893 Neoplasm related pain (acute) (chronic): Secondary | ICD-10-CM

## 2016-04-06 DIAGNOSIS — G4700 Insomnia, unspecified: Secondary | ICD-10-CM

## 2016-04-06 DIAGNOSIS — C9002 Multiple myeloma in relapse: Secondary | ICD-10-CM

## 2016-04-06 DIAGNOSIS — R5383 Other fatigue: Secondary | ICD-10-CM

## 2016-04-06 DIAGNOSIS — Z79899 Other long term (current) drug therapy: Secondary | ICD-10-CM

## 2016-04-06 DIAGNOSIS — R0602 Shortness of breath: Secondary | ICD-10-CM

## 2016-04-06 DIAGNOSIS — D696 Thrombocytopenia, unspecified: Secondary | ICD-10-CM

## 2016-04-06 LAB — CULTURE, BLOOD (ROUTINE X 2)
Culture: NO GROWTH
Culture: NO GROWTH

## 2016-04-06 LAB — CBC WITH DIFFERENTIAL/PLATELET
BASOS ABS: 0 10*3/uL (ref 0–0.1)
Basophils Relative: 0 %
EOS PCT: 0 %
Eosinophils Absolute: 0 10*3/uL (ref 0–0.7)
HCT: 27.3 % — ABNORMAL LOW (ref 35.0–47.0)
HEMOGLOBIN: 9 g/dL — AB (ref 12.0–16.0)
LYMPHS ABS: 0.7 10*3/uL — AB (ref 1.0–3.6)
LYMPHS PCT: 7 %
MCH: 31.3 pg (ref 26.0–34.0)
MCHC: 33.1 g/dL (ref 32.0–36.0)
MCV: 94.5 fL (ref 80.0–100.0)
Monocytes Absolute: 1.4 10*3/uL — ABNORMAL HIGH (ref 0.2–0.9)
Monocytes Relative: 13 %
NEUTROS PCT: 80 %
Neutro Abs: 8.9 10*3/uL — ABNORMAL HIGH (ref 1.4–6.5)
PLATELETS: 114 10*3/uL — AB (ref 150–440)
RBC: 2.89 MIL/uL — AB (ref 3.80–5.20)
RDW: 21.3 % — ABNORMAL HIGH (ref 11.5–14.5)
WBC: 11.2 10*3/uL — AB (ref 3.6–11.0)

## 2016-04-06 LAB — COMPREHENSIVE METABOLIC PANEL
ALK PHOS: 175 U/L — AB (ref 38–126)
ALT: 15 U/L (ref 14–54)
AST: 31 U/L (ref 15–41)
Albumin: 2.7 g/dL — ABNORMAL LOW (ref 3.5–5.0)
Anion gap: 14 (ref 5–15)
BUN: 59 mg/dL — AB (ref 6–20)
CALCIUM: 8.1 mg/dL — AB (ref 8.9–10.3)
CHLORIDE: 98 mmol/L — AB (ref 101–111)
CO2: 24 mmol/L (ref 22–32)
CREATININE: 3.21 mg/dL — AB (ref 0.44–1.00)
GFR calc Af Amer: 18 mL/min — ABNORMAL LOW (ref >60)
GFR, EST NON AFRICAN AMERICAN: 16 mL/min — AB (ref >60)
Glucose, Bld: 122 mg/dL — ABNORMAL HIGH (ref 65–99)
Potassium: 3.9 mmol/L (ref 3.5–5.1)
SODIUM: 136 mmol/L (ref 135–145)
Total Bilirubin: 1 mg/dL (ref 0.3–1.2)
Total Protein: 6.1 g/dL — ABNORMAL LOW (ref 6.5–8.1)

## 2016-04-06 MED ORDER — MORPHINE SULFATE (PF) 2 MG/ML IV SOLN
2.0000 mg | Freq: Once | INTRAVENOUS | Status: AC
  Administered 2016-04-06: 2 mg via INTRAVENOUS
  Filled 2016-04-06: qty 1

## 2016-04-06 MED ORDER — HEPARIN SOD (PORK) LOCK FLUSH 100 UNIT/ML IV SOLN
500.0000 [IU] | Freq: Once | INTRAVENOUS | Status: AC | PRN
  Administered 2016-04-06: 500 [IU]
  Filled 2016-04-06: qty 5

## 2016-04-06 MED ORDER — SODIUM CHLORIDE 0.9% FLUSH
10.0000 mL | INTRAVENOUS | Status: DC | PRN
  Administered 2016-04-06: 10 mL
  Filled 2016-04-06: qty 10

## 2016-04-06 MED ORDER — SODIUM CHLORIDE 0.9 % IV SOLN: Freq: Once | INTRAVENOUS | Status: DC

## 2016-04-06 MED ORDER — OXYCODONE HCL 10 MG PO TABS: 10.0000 mg | ORAL_TABLET | ORAL | 0 refills | Status: DC | PRN

## 2016-04-06 MED ORDER — SODIUM CHLORIDE 0.9 % IV SOLN
Freq: Once | INTRAVENOUS | Status: AC
  Administered 2016-04-06: 12:00:00 via INTRAVENOUS
  Filled 2016-04-06: qty 1000

## 2016-04-06 MED ORDER — PROCHLORPERAZINE MALEATE 10 MG PO TABS
10.0000 mg | ORAL_TABLET | Freq: Once | ORAL | Status: AC
  Administered 2016-04-06: 10 mg via ORAL
  Filled 2016-04-06: qty 1

## 2016-04-06 MED ORDER — CARFILZOMIB CHEMO INJECTION 60 MG
20.0000 mg/m2 | Freq: Once | INTRAVENOUS | Status: AC
  Administered 2016-04-06: 30 mg via INTRAVENOUS
  Filled 2016-04-06: qty 15

## 2016-04-06 MED ORDER — ONDANSETRON 8 MG PO TBDP
8.0000 mg | ORAL_TABLET | Freq: Once | ORAL | Status: AC
  Administered 2016-04-06: 8 mg via ORAL
  Filled 2016-04-06: qty 1

## 2016-04-06 NOTE — Discharge Summary (Signed)
[place] [facility] [place] NAME: Jamie Keller    MR#:  [id number]  DATE OF BIRTH:  1965/03/29  DATE OF ADMISSION:  04/01/2016 ADMITTING PHYSICIAN: Hillary Bow, MD  DATE OF DISCHARGE: 04/02/2016  2:29 PM  PRIMARY CARE PHYSICIAN: [name], MD   ADMISSION DIAGNOSIS:  Cough [R05] Hyperkalemia [E87.5] Multiple myeloma in relapse ([place]) [C90.02] Dyspnea, unspecified type [R06.00] Acute renal failure superimposed on chronic kidney disease, unspecified CKD stage, unspecified acute renal failure type ([place]) [N17.9, N18.9]  DISCHARGE DIAGNOSIS:  Active Problems:   Post-menopausal   Sepsis ([place])   Pulmonary edema   Hyperkalemia   Influenza B   SECONDARY DIAGNOSIS:   Past Medical History:  Diagnosis Date  . Anemia   . Anxiety   . Chronic kidney disease    DIALYSIS  . Depression   . Diabetes mellitus without complication ([place])   . Dialysis patient ([place])    Mon, Wed, Fri  . History of chemotherapy    chemo on Tuesday and Wednesday  . Hypertension   . Multiple myeloma ([place])   . Neuropathy associated with cancer ([place])   . Osteoarthritis   . Pancreatitis   . Pneumonia    September 2017, [facility]  . Post-menopausal 2014  . Sepsis ([place])      ADMITTING HISTORY  CHIEF COMPLAINT:      Chief Complaint  Patient presents with  . Cough    HISTORY OF PRESENT ILLNESS:  Jamie Keller  is a 51 y.o. 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 She has noticed acutely worsening shortness of breath overnight and presented to the emergency room. Patient has 2 other family members sick with cough and cold. Afebrile. She has been found to have hyperkalemia on blood work along with mild pulmonary edema.   HOSPITAL COURSE:   * Hyperkalemia with end-stage renal disease * .  Influenza A pneumonitis * multiple myeloma * sepsis * acute hypoxic respiratory failure * insulin-dependent diabetes mellitus  Patient was admitted to the medical floor with telemetry monitoring for urgent dialysis due to hyperkalemia.  She also had pulmonary edema due to missing hemodialysis. After dialysis patient was weaned off oxygen. Influenza A was positive on influenza PCR and started on Tamiflu which was renally dosed. Patient is being discharged home on Tamiflu, oral prednisone and also was counseled to be compliant with dialysis. Seen by nephrology to her hospital stay.  Discharged home in stable condition.  CONSULTS OBTAINED:  Treatment Team:  Murlean Iba, MD  DRUG ALLERGIES:  No Known Allergies  DISCHARGE MEDICATIONS:   Discharge Medication List as of 04/02/2016 11:16 AM    START taking these medications   Details  levofloxacin (LEVAQUIN) 250 MG tablet Take 1 tablet (250 mg total) by mouth every other day., Starting Fri 04/02/2016, Normal    oseltamivir (TAMIFLU) 30 MG capsule Take 1 capsule (30 mg total) by mouth 2 (two) times daily., Starting Fri 04/02/2016, Normal    predniSONE (DELTASONE) 50 MG tablet Take 1 tablet (50 mg total) by mouth daily with breakfast., Starting Sat 04/03/2016, Normal      CONTINUE these medications which have NOT CHANGED   Details  benzonatate (TESSALON) 200 MG capsule Take 1 capsule (200 mg total) by mouth 3 (three) times daily as needed for cough., Starting Tue 03/30/2016, Normal    citalopram (CELEXA) 20 MG tablet  Take 1 tablet (20 mg total) by mouth daily., Starting Fri 03/12/2016, Normal    HYDROcodone-acetaminophen (NORCO) 5-325 MG tablet Take 1-2 tablets by mouth every 6 (six) hours as needed for moderate pain or severe pain., Starting Wed 03/17/2016, Print    hydrOXYzine (ATARAX/VISTARIL) 10 MG tablet Take 1 tablet (10 mg total) by mouth 3 (three) times daily as needed for itching., Starting Tue 03/16/2016, Normal    Insulin Detemir  (LEVEMIR FLEXPEN) 100 UNIT/ML Pen Inject 5 Units into the skin daily at 10 pm., Historical Med    prochlorperazine (COMPAZINE) 10 MG tablet Take 10 mg by mouth every 6 (six) hours as needed for nausea or vomiting., Historical Med    verapamil (CALAN-SR) 120 MG CR tablet Take 120 mg by mouth daily., Historical Med    dronabinol (MARINOL) 2.5 MG capsule Take 1 capsule (2.5 mg total) by mouth 2 (two) times daily before a meal., Starting Thu 02/05/2016, Print    furosemide (LASIX) 80 MG tablet Take 1 tablet (80 mg total) by mouth daily., Starting Sat 11/29/2015, Normal        Today   VITAL SIGNS:  Blood pressure 138/68, pulse 83, temperature 98 F (36.7 C), temperature source Oral, resp. rate 17, height 5' (1.524 m), weight 46.9 kg (103 lb 6.4 oz), last menstrual period 02/25/1995, SpO2 96 %.  I/O:  No intake or output data in the 24 hours ending 04/06/16 1625  PHYSICAL EXAMINATION:  Physical Exam  GENERAL:  51 y.o.-year-old patient lying in the bed with no acute distress.  LUNGS: Normal breath sounds bilaterally, no wheezing, rales,rhonchi or crepitation. No use of accessory muscles of respiration.  CARDIOVASCULAR: S1, S2 normal. No murmurs, rubs, or gallops.  ABDOMEN: Soft, non-tender, non-distended. Bowel sounds present. No organomegaly or mass.  NEUROLOGIC: Moves all 4 extremities. PSYCHIATRIC: The patient is alert and oriented x 3.  SKIN: No obvious rash, lesion, or ulcer.   DATA REVIEW:   CBC  Recent Labs Lab 04/06/16 0924  WBC 11.2*  HGB 9.0*  HCT 27.3*  PLT 114*    Chemistries   Recent Labs Lab 04/06/16 0924  NA 136  K 3.9  CL 98*  CO2 24  GLUCOSE 122*  BUN 59*  CREATININE 3.21*  CALCIUM 8.1*  AST 31  ALT 15  ALKPHOS 175*  BILITOT 1.0    Cardiac Enzymes  Recent Labs Lab 04/01/16 0800  TROPONINI 0.04*    Microbiology Results  Results for orders placed or performed during the hospital encounter of 04/01/16  Blood culture (routine x 2)      Status: None   Collection Time: 04/01/16  9:12 AM  Result Value Ref Range Status   Specimen Description BLOOD RIGHT PORTA CATH  Final   Special Requests   Final    BOTTLES DRAWN AEROBIC AND ANAEROBIC AER 9 ML ANA 7 ML   Culture NO GROWTH 5 DAYS  Final   Report Status 04/06/2016 FINAL  Final  Blood culture (routine x 2)     Status: None   Collection Time: 04/01/16  9:12 AM  Result Value Ref Range Status   Specimen Description BLOOD RIGHT ARM  Final   Special Requests   Final    BOTTLES DRAWN AEROBIC AND ANAEROBIC AER 8 ML ANA8 ML   Culture NO GROWTH 5 DAYS  Final   Report Status 04/06/2016 FINAL  Final  MRSA PCR Screening     Status: None   Collection Time: 04/01/16  7:47 PM  Result Value  Ref Range Status   MRSA by PCR NEGATIVE NEGATIVE Final    Comment:        The GeneXpert MRSA Assay (FDA approved for NASAL specimens only), is one component of a comprehensive MRSA colonization surveillance program. It is not intended to diagnose MRSA infection nor to guide or monitor treatment for MRSA infections.     RADIOLOGY:  No results found.  Follow up with PCP in 1 week.  Management plans discussed with the patient, family and they are in agreement.  CODE STATUS:  Code Status History    Date Active Date Inactive Code Status Order ID Comments User Context   04/01/2016  9:52 AM 04/02/2016  5:34 PM Full Code [id number]  Hillary Bow, MD ED   01/14/2016  7:59 PM 01/15/2016  8:39 PM Full Code [id number]  Loletha Grayer, MD ED   12/02/2015  1:10 AM 12/08/2015  7:22 PM Full Code [id number]  Lance Coon, MD ED   11/14/2015 12:52 AM 11/17/2015  1:12 PM Full Code [id number]  Holley Raring, NP ED   11/14/2015 12:06 AM 11/14/2015 12:12 AM Full Code [id number]  Holley Raring, NP ED      TOTAL TIME TAKING CARE OF THIS PATIENT ON DAY OF DISCHARGE: more than 30 minutes.   Hillary Bow R M.D on 04/06/2016 at 4:25 PM  Between 7am to 6pm - Pager - [phone]  After 6pm go to [email or web address] -  password [email or web address]  Office  [phone]  CC: Primary care physician; [name], MD  Note: This dictation was prepared with Dragon dictation along with smaller phrase technology. Any transcriptional errors that result from this process are unintentional.

## 2016-04-06 NOTE — Progress Notes (Signed)
Patient is here for follow up, had tried to use video interpreter but it was cutting off, called language line Glori Luis was interpreter and id number is [phone].

## 2016-04-07 ENCOUNTER — Inpatient Hospital Stay: Payer: Self-pay

## 2016-04-07 VITALS — BP 128/84 | HR 91 | Temp 96.7°F | Resp 20

## 2016-04-07 DIAGNOSIS — C9002 Multiple myeloma in relapse: Secondary | ICD-10-CM

## 2016-04-07 LAB — KAPPA/LAMBDA LIGHT CHAINS
KAPPA FREE LGHT CHN: 11949.8 mg/L — AB (ref 3.3–19.4)
KAPPA, LAMDA LIGHT CHAIN RATIO: 781.03 — AB (ref 0.26–1.65)
LAMDA FREE LIGHT CHAINS: 15.3 mg/L (ref 5.7–26.3)

## 2016-04-07 LAB — PROTEIN ELECTROPHORESIS, SERUM
A/G Ratio: 1 (ref 0.7–1.7)
ALBUMIN ELP: 2.8 g/dL — AB (ref 2.9–4.4)
Alpha-1-Globulin: 0.3 g/dL (ref 0.0–0.4)
Alpha-2-Globulin: 0.9 g/dL (ref 0.4–1.0)
Beta Globulin: 1.1 g/dL (ref 0.7–1.3)
GAMMA GLOBULIN: 0.5 g/dL (ref 0.4–1.8)
Globulin, Total: 2.8 g/dL (ref 2.2–3.9)
M-SPIKE, %: 0.3 g/dL — AB
TOTAL PROTEIN ELP: 5.6 g/dL — AB (ref 6.0–8.5)

## 2016-04-07 MED ORDER — PROCHLORPERAZINE MALEATE 10 MG PO TABS
10.0000 mg | ORAL_TABLET | Freq: Once | ORAL | Status: AC
  Administered 2016-04-07: 10 mg via ORAL
  Filled 2016-04-07: qty 1

## 2016-04-07 MED ORDER — SODIUM CHLORIDE 0.9% FLUSH
10.0000 mL | INTRAVENOUS | Status: DC | PRN
  Administered 2016-04-07: 10 mL
  Filled 2016-04-07: qty 10

## 2016-04-07 MED ORDER — DEXTROSE 5 % IV SOLN
20.0000 mg/m2 | Freq: Once | INTRAVENOUS | Status: AC
  Administered 2016-04-07: 30 mg via INTRAVENOUS
  Filled 2016-04-07: qty 15

## 2016-04-07 MED ORDER — SODIUM CHLORIDE 0.9 % IV SOLN
Freq: Once | INTRAVENOUS | Status: AC
  Administered 2016-04-07: 09:00:00 via INTRAVENOUS
  Filled 2016-04-07: qty 1000

## 2016-04-07 MED ORDER — SODIUM CHLORIDE 0.9 % IV SOLN: Freq: Once | INTRAVENOUS | Status: DC

## 2016-04-07 MED ORDER — HEPARIN SOD (PORK) LOCK FLUSH 100 UNIT/ML IV SOLN
500.0000 [IU] | Freq: Once | INTRAVENOUS | Status: AC | PRN
  Administered 2016-04-07: 500 [IU]
  Filled 2016-04-07: qty 5

## 2016-04-07 MED ORDER — ONDANSETRON 8 MG PO TBDP
8.0000 mg | ORAL_TABLET | Freq: Once | ORAL | Status: AC
  Administered 2016-04-07: 8 mg via ORAL
  Filled 2016-04-07: qty 1

## 2016-04-12 NOTE — Progress Notes (Signed)
[facility]  [phone] [phone]  ID: [id number] Fread OB: 04/09/1965  MR#: [id number]  CSN#:[id number]  Patient Care Team: Adrian Mancheno Revelo, MD as PCP - General (Family Medicine)  CHIEF COMPLAINT: Multiple Myeloma in relapse with multiple bony lytic lesions, now with end-stage renal disease on dialysis.   INTERVAL HISTORY: Patient returns to clinic today for further evaluation and consideration of cycle 4, day 15 of Kyprolis. She continues to feel chronically weak and fatigued today. She denies fever or chills.  She has a fair appetite, but her weight has remained stable.  She continues to have chronic back pain that would occasionally radiate down her leg.  She has dyspnea on exertion, but denies cough, hemoptysis, or chest pain. She complains of mild nausea, denies vomiting, constipation, or diarrhea. She reports feeling anxious and having trouble sleeping.  Patient offers no further specific complaints today.   REVIEW OF SYSTEMS:   Review of Systems  Constitutional: Positive for malaise/fatigue. Negative for chills, fever and weight loss.  HENT: Negative for congestion.   Respiratory: Positive for shortness of breath. Negative for cough.   Cardiovascular: Negative.  Negative for chest pain and leg swelling.  Gastrointestinal: Positive for nausea. Negative for abdominal pain, blood in stool, constipation, diarrhea and vomiting.  Genitourinary: Negative for hematuria.       On dialysis  Musculoskeletal: Positive for back pain.  Skin: Negative for itching and rash.  Neurological: Positive for tingling, sensory change and weakness. Negative for focal weakness and headaches.  Psychiatric/Behavioral: The patient is nervous/anxious and has insomnia.    As per HPI. Otherwise, a complete review of systems is negative.   PAST MEDICAL HISTORY: Past Medical History:  Diagnosis Date  . Anemia   . Anxiety   . Chronic kidney disease    DIALYSIS  . Depression   . Diabetes mellitus without complication ([facility])   . Dialysis patient ([facility])    Mon, Wed, Fri  . History of chemotherapy    chemo on Tuesday and Wednesday  . Hypertension   . Multiple myeloma ([facility])   . Neuropathy associated with cancer ([facility])   . Osteoarthritis   . Pancreatitis   . Pneumonia    September 2017, [facility]  . Post-menopausal 2014  . Sepsis ([facility])     PAST SURGICAL HISTORY: Past Surgical History:  Procedure Laterality Date  . AV FISTULA PLACEMENT Left 02/20/2016   Procedure: INSERTION OF ARTERIOVENOUS (AV) GORE-TEX GRAFT ARM;  Surgeon: Gregory G Schnier, MD;  Location: [facility] ORS;  Service: Vascular;  Laterality: Left;  . CESAREAN SECTION     x2  . dialysis catheter placement    . PERIPHERAL VASCULAR CATHETERIZATION N/A 11/19/2015   Procedure: Dialysis/Perma Catheter Insertion;  Surgeon: Jason S Dew, MD;  Location: [facility] INVASIVE CV LAB;  Service: Cardiovascular;  Laterality: N/A;  . PERIPHERAL VASCULAR CATHETERIZATION Left 03/10/2016   Procedure: Thrombectomy;  Surgeon: Gregory G Schnier, MD;  Location: [facility] INVASIVE CV LAB;  Service: Cardiovascular;  Laterality: Left;  . PORTACATH PLACEMENT    . TUBAL LIGATION      FAMILY HISTORY: Reviewed and unchanged. No reported history of malignancy or chronic disease.     ADVANCED DIRECTIVES:    HEALTH MAINTENANCE: Social History  Substance Use Topics  . Smoking status: Never Smoker  . Smokeless tobacco: Never Used  . Alcohol use No     No Known Allergies  Current Outpatient Prescriptions  Medication Sig Dispense Refill  . acetaminophen (TYLENOL) 500 MG tablet Take   500 mg by mouth every 6 (six) hours as needed.    . citalopram (CELEXA) 20 MG tablet Take 1 tablet (20 mg total) by mouth daily. 30 tablet 5  . HYDROcodone-acetaminophen (NORCO) 5-325 MG tablet Take 1-2 tablets by mouth every 6 (six) hours as needed for moderate pain or severe pain. 50 tablet 0  . Insulin Detemir (LEVEMIR FLEXPEN) 100  UNIT/ML Pen Inject 5 Units into the skin daily at 10 pm.    . VERAPAMIL HCL ER PO Take 120 mg by mouth daily.    . dronabinol (MARINOL) 2.5 MG capsule Take 1 capsule (2.5 mg total) by mouth 2 (two) times daily before a meal. (Patient not taking: Reported on 03/16/2016) 60 capsule 0  . ferrous sulfate 325 (65 FE) MG tablet Take 325 mg by mouth daily with breakfast.    . furosemide (LASIX) 80 MG tablet Take 1 tablet (80 mg total) by mouth daily. (Patient not taking: Reported on 03/16/2016) 30 tablet 0  . promethazine (PHENERGAN) 25 MG tablet Take 25 mg by mouth every 6 (six) hours as needed for nausea or vomiting.     No current facility-administered medications for this visit.    Facility-Administered Medications Ordered in Other Visits  Medication Dose Route Frequency Provider Last Rate Last Dose  . heparin lock flush 100 unit/mL  500 Units Intravenous Once Timothy J Finnegan, MD      . sodium chloride flush (NS) 0.9 % injection 10 mL  10 mL Intravenous Once Timothy J Finnegan, MD        OBJECTIVE: Vitals:   03/16/16 0937  BP: (!) 144/69  Pulse: 98  Resp: 18  Temp: 97.5 F (36.4 C)     Body mass index is 19.33 kg/m.    ECOG FS:1 - Symptomatic  General: No acute distress. Eyes: anicteric sclera. Lungs: Clear to auscultation bilaterally. Heart: Regular rate and rhythm. No rubs, murmurs, or gallops. Abdomen: Soft, nontender, nondistended. No organomegaly noted, normoactive bowel sounds. Musculoskeletal: No edema, cyanosis, or clubbing. Neuro: Alert, answering all questions appropriately. Cranial nerves grossly intact. Skin: Jaundiced. No rashes or petechiae noted.  Psych: Normal affect.   LAB RESULTS:  Lab Results  Component Value Date   NA 135 03/16/2016   K 3.9 03/16/2016   CL 97 (L) 03/16/2016   CO2 27 03/16/2016   GLUCOSE 166 (H) 03/16/2016   BUN 25 (H) 03/16/2016   CREATININE 3.31 (H) 03/16/2016   CALCIUM 9.2 03/16/2016   PROT 6.2 (L) 03/16/2016   ALBUMIN 2.7 (L)  03/16/2016   AST 29 03/16/2016   ALT 10 (L) 03/16/2016   ALKPHOS 167 (H) 03/16/2016   BILITOT 1.3 (H) 03/16/2016   GFRNONAA 15 (L) 03/16/2016   GFRAA 17 (L) 03/16/2016    Lab Results  Component Value Date   WBC 6.0 03/16/2016   NEUTROABS 4.0 03/16/2016   HGB 9.2 (L) 03/16/2016   HCT 27.6 (L) 03/16/2016   MCV 93.6 03/16/2016   PLT 136 (L) 03/16/2016   Lab Results  Component Value Date   TOTALPROTELP 5.4 (L) 03/09/2016   ALBUMINELP 2.8 (L) 03/09/2016   A1GS 0.3 03/09/2016   A2GS 0.8 03/09/2016   BETS 0.9 03/09/2016   GAMS 0.4 03/09/2016   MSPIKE 0.4 (H) 03/09/2016   SPEI Comment 03/09/2016     STUDIES: No results found.  ASSESSMENT: Multiple Myeloma in relapse with multiple bony lytic lesions, end-stage renal disease.   PLAN:    1. Multiple Myeloma in relapse with multiple bony lytic   lesions: MRI, CT, bone scan results reviewed independently indicating progression of disease. Patient also has progressed to end-stage renal disease, likely secondary to underlying myeloma. Patient's kappa free light chains are erratic, but have significantly trended up from approximately 12,000 greater than 26,000. Proceed with cycle 4, day 15 of Kyprolis. Given patient's decreased performance status, will not increase dose of Kyprolis. Patient is receiving receive treatment on days 1, 2, 8, 9, 15, and 16. Kyprolis does not need to be dose reduced in the setting of end-stage renal disease and dialysis. Can consider Delton See given her renal failure. Dexamethasone has been discontinued given her difficulty controllingl blood sugar. Because of patient's immigration status, she could not undergo transplant. Return to clinic tomorrow for treatment and in 2 weeks for consideration of of cycle 5, day 1. We will consider change of treatment at that time given her rising kappa light chains. 2. Back pain: Pain persists. Continue current narcotics as prescribed.  Take oxycodone as prescribed for severe  breakthrough pain. 3. End-stage renal disease: Likely secondary to progression of disease. Continue dialysis on Monday, Wednesdays and Fridays. Has fistula. 4. Anemia: Hemoglobin decreased. Patient does not require transfusion at this time.  5. Hyperglycemia: Patient's blood glucose has improved since initiating insulin. Continue monitoring and treatment by primary care. Continue to hold dexamethasone as above. 6. Thrombocytopenia: Multifactorial secondary to treatment as well as disease progression. Improved. 7. Depression: Continue Celexa 20 mg daily. Patient was given a refill today. 8. Weight Loss: Stable without medication. Patient stopped taking Marinol due to making her "feel funny". She states that she has an improved appetite without the medication. Will continue to monitor. 9. Constipation: Resolved. Take OTC medications if needed. Monitor.  10. Pruritus: Resolved. Patient states Benadryl works, but only for several hours. Continue Atarax if needed.   Interpreter present during entire clinic visit.  Patient expressed understanding and was in agreement with this plan. She also understands that She can call clinic at any time with any questions, concerns, or complaints.    Lloyd Huger, MD 04/12/16 11:28 PM

## 2016-04-13 ENCOUNTER — Inpatient Hospital Stay: Payer: Self-pay | Attending: Oncology

## 2016-04-13 ENCOUNTER — Inpatient Hospital Stay: Payer: Self-pay

## 2016-04-13 ENCOUNTER — Inpatient Hospital Stay (HOSPITAL_BASED_OUTPATIENT_CLINIC_OR_DEPARTMENT_OTHER): Payer: Self-pay | Admitting: Oncology

## 2016-04-13 VITALS — BP 126/69 | HR 97 | Temp 98.3°F | Resp 18 | Wt 101.6 lb

## 2016-04-13 DIAGNOSIS — R634 Abnormal weight loss: Secondary | ICD-10-CM

## 2016-04-13 DIAGNOSIS — I129 Hypertensive chronic kidney disease with stage 1 through stage 4 chronic kidney disease, or unspecified chronic kidney disease: Secondary | ICD-10-CM | POA: Insufficient documentation

## 2016-04-13 DIAGNOSIS — G8929 Other chronic pain: Secondary | ICD-10-CM | POA: Insufficient documentation

## 2016-04-13 DIAGNOSIS — Z5111 Encounter for antineoplastic chemotherapy: Secondary | ICD-10-CM | POA: Insufficient documentation

## 2016-04-13 DIAGNOSIS — F329 Major depressive disorder, single episode, unspecified: Secondary | ICD-10-CM

## 2016-04-13 DIAGNOSIS — R110 Nausea: Secondary | ICD-10-CM | POA: Insufficient documentation

## 2016-04-13 DIAGNOSIS — G4700 Insomnia, unspecified: Secondary | ICD-10-CM | POA: Insufficient documentation

## 2016-04-13 DIAGNOSIS — F419 Anxiety disorder, unspecified: Secondary | ICD-10-CM

## 2016-04-13 DIAGNOSIS — R630 Anorexia: Secondary | ICD-10-CM | POA: Insufficient documentation

## 2016-04-13 DIAGNOSIS — M549 Dorsalgia, unspecified: Secondary | ICD-10-CM

## 2016-04-13 DIAGNOSIS — Z8719 Personal history of other diseases of the digestive system: Secondary | ICD-10-CM | POA: Insufficient documentation

## 2016-04-13 DIAGNOSIS — G629 Polyneuropathy, unspecified: Secondary | ICD-10-CM | POA: Insufficient documentation

## 2016-04-13 DIAGNOSIS — C9002 Multiple myeloma in relapse: Secondary | ICD-10-CM

## 2016-04-13 DIAGNOSIS — Z79899 Other long term (current) drug therapy: Secondary | ICD-10-CM

## 2016-04-13 DIAGNOSIS — E1122 Type 2 diabetes mellitus with diabetic chronic kidney disease: Secondary | ICD-10-CM

## 2016-04-13 DIAGNOSIS — Z8701 Personal history of pneumonia (recurrent): Secondary | ICD-10-CM

## 2016-04-13 DIAGNOSIS — N186 End stage renal disease: Secondary | ICD-10-CM | POA: Insufficient documentation

## 2016-04-13 DIAGNOSIS — R5383 Other fatigue: Secondary | ICD-10-CM | POA: Insufficient documentation

## 2016-04-13 DIAGNOSIS — R531 Weakness: Secondary | ICD-10-CM | POA: Insufficient documentation

## 2016-04-13 DIAGNOSIS — M1990 Unspecified osteoarthritis, unspecified site: Secondary | ICD-10-CM | POA: Insufficient documentation

## 2016-04-13 DIAGNOSIS — D649 Anemia, unspecified: Secondary | ICD-10-CM | POA: Insufficient documentation

## 2016-04-13 DIAGNOSIS — R443 Hallucinations, unspecified: Secondary | ICD-10-CM | POA: Insufficient documentation

## 2016-04-13 DIAGNOSIS — D696 Thrombocytopenia, unspecified: Secondary | ICD-10-CM | POA: Insufficient documentation

## 2016-04-13 DIAGNOSIS — Z992 Dependence on renal dialysis: Secondary | ICD-10-CM

## 2016-04-13 DIAGNOSIS — R410 Disorientation, unspecified: Secondary | ICD-10-CM | POA: Insufficient documentation

## 2016-04-13 LAB — COMPREHENSIVE METABOLIC PANEL
ALBUMIN: 2.8 g/dL — AB (ref 3.5–5.0)
ALK PHOS: 162 U/L — AB (ref 38–126)
ALT: 12 U/L — AB (ref 14–54)
ANION GAP: 10 (ref 5–15)
AST: 23 U/L (ref 15–41)
BUN: 32 mg/dL — AB (ref 6–20)
CALCIUM: 9 mg/dL (ref 8.9–10.3)
CO2: 29 mmol/L (ref 22–32)
Chloride: 97 mmol/L — ABNORMAL LOW (ref 101–111)
Creatinine, Ser: 3.09 mg/dL — ABNORMAL HIGH (ref 0.44–1.00)
GFR calc Af Amer: 19 mL/min — ABNORMAL LOW (ref >60)
GFR calc non Af Amer: 16 mL/min — ABNORMAL LOW (ref >60)
GLUCOSE: 213 mg/dL — AB (ref 65–99)
Potassium: 4.3 mmol/L (ref 3.5–5.1)
SODIUM: 136 mmol/L (ref 135–145)
Total Bilirubin: 1.3 mg/dL — ABNORMAL HIGH (ref 0.3–1.2)
Total Protein: 6.3 g/dL — ABNORMAL LOW (ref 6.5–8.1)

## 2016-04-13 LAB — CBC WITH DIFFERENTIAL/PLATELET
BASOS ABS: 0 10*3/uL (ref 0–0.1)
BASOS PCT: 0 %
EOS ABS: 0 10*3/uL (ref 0–0.7)
Eosinophils Relative: 0 %
HEMATOCRIT: 25.6 % — AB (ref 35.0–47.0)
HEMOGLOBIN: 8.6 g/dL — AB (ref 12.0–16.0)
Lymphocytes Relative: 6 %
Lymphs Abs: 0.4 10*3/uL — ABNORMAL LOW (ref 1.0–3.6)
MCH: 32.1 pg (ref 26.0–34.0)
MCHC: 33.5 g/dL (ref 32.0–36.0)
MCV: 95.7 fL (ref 80.0–100.0)
Monocytes Absolute: 0.9 10*3/uL (ref 0.2–0.9)
Monocytes Relative: 13 %
NEUTROS ABS: 5.9 10*3/uL (ref 1.4–6.5)
NEUTROS PCT: 81 %
Platelets: 118 10*3/uL — ABNORMAL LOW (ref 150–440)
RBC: 2.67 MIL/uL — AB (ref 3.80–5.20)
RDW: 21.3 % — ABNORMAL HIGH (ref 11.5–14.5)
WBC: 7.2 10*3/uL (ref 3.6–11.0)

## 2016-04-13 MED ORDER — HEPARIN SOD (PORK) LOCK FLUSH 100 UNIT/ML IV SOLN
500.0000 [IU] | Freq: Once | INTRAVENOUS | Status: AC | PRN
  Administered 2016-04-13: 500 [IU]
  Filled 2016-04-13: qty 5

## 2016-04-13 MED ORDER — SODIUM CHLORIDE 0.9 % IV SOLN: Freq: Once | INTRAVENOUS | Status: DC

## 2016-04-13 MED ORDER — DEXTROSE 5 % IV SOLN
20.0000 mg/m2 | Freq: Once | INTRAVENOUS | Status: AC
  Administered 2016-04-13: 30 mg via INTRAVENOUS
  Filled 2016-04-13: qty 15

## 2016-04-13 MED ORDER — ONDANSETRON 8 MG PO TBDP
8.0000 mg | ORAL_TABLET | Freq: Once | ORAL | Status: AC
  Administered 2016-04-13: 8 mg via ORAL
  Filled 2016-04-13: qty 1

## 2016-04-13 MED ORDER — SODIUM CHLORIDE 0.9 % IV SOLN
Freq: Once | INTRAVENOUS | Status: AC
  Administered 2016-04-13: 13:00:00 via INTRAVENOUS
  Filled 2016-04-13: qty 1000

## 2016-04-13 MED ORDER — PROCHLORPERAZINE MALEATE 10 MG PO TABS
10.0000 mg | ORAL_TABLET | Freq: Once | ORAL | Status: AC
  Administered 2016-04-13: 10 mg via ORAL
  Filled 2016-04-13: qty 1

## 2016-04-13 MED ORDER — SODIUM CHLORIDE 0.9% FLUSH
10.0000 mL | INTRAVENOUS | Status: DC | PRN
  Administered 2016-04-13: 10 mL
  Filled 2016-04-13: qty 10

## 2016-04-13 MED ORDER — CITALOPRAM HYDROBROMIDE 20 MG PO TABS: 20.0000 mg | ORAL_TABLET | Freq: Every day | ORAL | 5 refills | Status: DC

## 2016-04-13 NOTE — Progress Notes (Signed)
Patient is here for follow up, she feels a little weak, refill celexa.

## 2016-04-14 ENCOUNTER — Inpatient Hospital Stay: Payer: Self-pay

## 2016-04-14 DIAGNOSIS — C9002 Multiple myeloma in relapse: Secondary | ICD-10-CM

## 2016-04-14 LAB — PROTEIN ELECTROPHORESIS, SERUM
A/G Ratio: 1 (ref 0.7–1.7)
ALBUMIN ELP: 3 g/dL (ref 2.9–4.4)
Alpha-1-Globulin: 0.3 g/dL (ref 0.0–0.4)
Alpha-2-Globulin: 0.9 g/dL (ref 0.4–1.0)
BETA GLOBULIN: 1.2 g/dL (ref 0.7–1.3)
GAMMA GLOBULIN: 0.4 g/dL (ref 0.4–1.8)
Globulin, Total: 2.9 g/dL (ref 2.2–3.9)
M-SPIKE, %: 0.5 g/dL — AB
TOTAL PROTEIN ELP: 5.9 g/dL — AB (ref 6.0–8.5)

## 2016-04-14 LAB — KAPPA/LAMBDA LIGHT CHAINS
KAPPA FREE LGHT CHN: 26671.1 mg/L — AB (ref 3.3–19.4)
KAPPA, LAMDA LIGHT CHAIN RATIO: 3065.64 — AB (ref 0.26–1.65)
LAMDA FREE LIGHT CHAINS: 8.7 mg/L (ref 5.7–26.3)

## 2016-04-14 MED ORDER — PROCHLORPERAZINE MALEATE 10 MG PO TABS
10.0000 mg | ORAL_TABLET | Freq: Once | ORAL | Status: AC
  Administered 2016-04-14: 10 mg via ORAL
  Filled 2016-04-14: qty 1

## 2016-04-14 MED ORDER — DEXTROSE 5 % IV SOLN
20.0000 mg/m2 | Freq: Once | INTRAVENOUS | Status: AC
  Administered 2016-04-14: 30 mg via INTRAVENOUS
  Filled 2016-04-14: qty 15

## 2016-04-14 MED ORDER — SODIUM CHLORIDE 0.9 % IV SOLN: Freq: Once | INTRAVENOUS | Status: DC

## 2016-04-14 MED ORDER — HEPARIN SOD (PORK) LOCK FLUSH 100 UNIT/ML IV SOLN
500.0000 [IU] | Freq: Once | INTRAVENOUS | Status: AC | PRN
  Administered 2016-04-14: 500 [IU]
  Filled 2016-04-14: qty 5

## 2016-04-14 MED ORDER — ONDANSETRON 8 MG PO TBDP
8.0000 mg | ORAL_TABLET | Freq: Once | ORAL | Status: AC
  Administered 2016-04-14: 8 mg via ORAL
  Filled 2016-04-14: qty 1

## 2016-04-14 MED ORDER — SODIUM CHLORIDE 0.9 % IV SOLN
Freq: Once | INTRAVENOUS | Status: AC
  Filled 2016-04-14: qty 1000

## 2016-04-14 MED ORDER — SODIUM CHLORIDE 0.9 % IV SOLN
Freq: Once | INTRAVENOUS | Status: AC
  Administered 2016-04-14: 12:00:00 via INTRAVENOUS
  Filled 2016-04-14: qty 1000

## 2016-04-16 ENCOUNTER — Telehealth: Payer: Self-pay | Admitting: *Deleted

## 2016-04-16 NOTE — Telephone Encounter (Signed)
Asking for a call regarding unable to get BMT due to being undocumented. Please call him back [phone]

## 2016-04-26 NOTE — Progress Notes (Signed)
[place]  [phone] [phone]  ID: Jamie Keller OB: [birth year]  MR#: [id number]  JEH#:[id number]  [name], MD as PCP - General (Family Medicine)  CHIEF COMPLAINT: Multiple Myeloma in relapse with multiple bony lytic lesions, now with end-stage renal disease on dialysis.   INTERVAL HISTORY: Patient returns to clinic today for further evaluation. Her performance status is declining. She continues to feel chronically weak and fatigued today. Her pain is worse. Her daughter reports incresing episodes of confusion and hallucinations. She denies fever or chills. She has a poor appetite, but her weight has remained stable.  She continues to have chronic back pain that would occasionally radiate down her leg.  She has dyspnea on exertion, but denies cough, hemoptysis, or chest pain. She complains of mild nausea, denies vomiting, constipation, or diarrhea. She reports feeling anxious and having trouble sleeping.  Patient offers no further specific complaints today.   REVIEW OF SYSTEMS:   Review of Systems  Constitutional: Positive for malaise/fatigue. Negative for chills, fever and weight loss.  HENT: Negative for congestion.   Respiratory: Positive for shortness of breath. Negative for cough.   Cardiovascular: Negative.  Negative for chest pain and leg swelling.  Gastrointestinal: Positive for nausea. Negative for abdominal pain, blood in stool, constipation, diarrhea and vomiting.  Genitourinary: Negative for hematuria.       On dialysis  Musculoskeletal: Positive for back pain.  Skin: Negative for itching and rash.  Neurological: Positive for tingling, sensory change and weakness. Negative for focal weakness and headaches.  Psychiatric/Behavioral: The patient is nervous/anxious and has insomnia.    As per HPI. Otherwise, a complete review of systems is negative.   PAST MEDICAL HISTORY: Past Medical  History:  Diagnosis Date  . Anemia   . Anxiety   . Chronic kidney disease    DIALYSIS  . Depression   . Diabetes mellitus without complication ([place])   . Dialysis patient ([place])    Mon, Wed, Fri  . History of chemotherapy    chemo on Tuesday and Wednesday  . Hypertension   . Multiple myeloma ([place])   . 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 Osteoarthritis   . Pancreatitis   . Pneumonia    September 2017, [facility]  . Post-menopausal 2014  . Sepsis ([place])     PAST SURGICAL HISTORY: Past Surgical History:  Procedure Laterality Date  . AV FISTULA PLACEMENT Left 02/20/2016   Procedure: INSERTION OF ARTERIOVENOUS (AV) GORE-TEX GRAFT ARM;  Surgeon: Katha Cabal, MD;  Location: [facility] ORS;  Service: Vascular;  Laterality: Left;  . CESAREAN SECTION     x2  . dialysis catheter placement    . PERIPHERAL VASCULAR CATHETERIZATION N/A 11/19/2015   Procedure: Dialysis/Perma Catheter Insertion;  Surgeon: Algernon Huxley, MD;  Location: [place] [facility];  Service: Cardiovascular;  Laterality: N/A;  . PERIPHERAL VASCULAR CATHETERIZATION Left 03/10/2016   Procedure: Thrombectomy;  Surgeon: Katha Cabal, MD;  Location: [place] [facility];  Service: Cardiovascular;  Laterality: Left;  . PORTACATH PLACEMENT    . TUBAL LIGATION      FAMILY HISTORY: Reviewed and unchanged. No reported history of malignancy or chronic disease.     ADVANCED DIRECTIVES:    HEALTH MAINTENANCE: Social History  Substance Use Topics  . Smoking status: Never Smoker  . Smokeless tobacco: Never Used  . Alcohol use No     No Known Allergies  Current Outpatient Prescriptions  Medication Sig Dispense  Refill  . acetaminophen (TYLENOL) 500 MG tablet Take 500 mg by mouth every 6 (six) hours as needed.    . citalopram (CELEXA) 20 MG tablet Take 1 tablet (20 mg total) by mouth daily. 30 tablet 5  . HYDROcodone-acetaminophen (NORCO) 5-325 MG tablet Take 1-2 tablets by mouth every 6 (six) hours as needed for  moderate pain or severe pain. 50 tablet 0  . Insulin Detemir (LEVEMIR FLEXPEN) 100 UNIT/ML Pen Inject 5 Units into the skin daily at 10 pm.    . VERAPAMIL HCL ER PO Take 120 mg by mouth daily.    Marland Kitchen dronabinol (MARINOL) 2.5 MG capsule Take 1 capsule (2.5 mg total) by mouth 2 (two) times daily before a meal. (Patient not taking: Reported on 03/16/2016) 60 capsule 0  . ferrous sulfate 325 (65 FE) MG tablet Take 325 mg by mouth daily with breakfast.    . furosemide (LASIX) 80 MG tablet Take 1 tablet (80 mg total) by mouth daily. (Patient not taking: Reported on 03/16/2016) 30 tablet 0  . promethazine (PHENERGAN) 25 MG tablet Take 25 mg by mouth every 6 (six) hours as needed for nausea or vomiting.     No current facility-administered medications for this visit.    Facility-Administered Medications Ordered in Other Visits  Medication Dose Route Frequency Provider Last Rate Last Dose  . heparin lock flush 100 unit/mL  500 Units Intravenous Once Lloyd Huger, MD      . sodium chloride flush (NS) 0.9 % injection 10 mL  10 mL Intravenous Once Lloyd Huger, MD        OBJECTIVE: Vitals:   03/16/16 0937  BP: (!) 144/69  Pulse: 98  Resp: 18  Temp: 97.5 F (36.4 C)     Body mass index is 19.33 kg/m.    ECOG FS:1 - Symptomatic  General: No acute distress. Eyes: anicteric sclera. Lungs: Clear to auscultation bilaterally. Heart: Regular rate and rhythm. No rubs, murmurs, or gallops. Abdomen: Soft, nontender, nondistended. No organomegaly noted, normoactive bowel sounds. Musculoskeletal: No edema, cyanosis, or clubbing. Neuro: Alert, answering all questions appropriately. Cranial nerves grossly intact. Skin: Jaundiced. No rashes or petechiae noted.  Psych: Normal affect.   LAB RESULTS:  Lab Results  Component Value Date   NA 135 03/16/2016   K 3.9 03/16/2016   CL 97 (L) 03/16/2016   CO2 27 03/16/2016   GLUCOSE 166 (H) 03/16/2016   BUN 25 (H) 03/16/2016   CREATININE 3.31 (H)  03/16/2016   CALCIUM 9.2 03/16/2016   PROT 6.2 (L) 03/16/2016   ALBUMIN 2.7 (L) 03/16/2016   AST 29 03/16/2016   ALT 10 (L) 03/16/2016   ALKPHOS 167 (H) 03/16/2016   BILITOT 1.3 (H) 03/16/2016   GFRNONAA 15 (L) 03/16/2016   GFRAA 17 (L) 03/16/2016    Lab Results  Component Value Date   WBC 6.0 03/16/2016   NEUTROABS 4.0 03/16/2016   HGB 9.2 (L) 03/16/2016   HCT 27.6 (L) 03/16/2016   MCV 93.6 03/16/2016   PLT 136 (L) 03/16/2016   Lab Results  Component Value Date   TOTALPROTELP 5.4 (L) 03/09/2016   ALBUMINELP 2.8 (L) 03/09/2016   A1GS 0.3 03/09/2016   A2GS 0.8 03/09/2016   BETS 0.9 03/09/2016   GAMS 0.4 03/09/2016   MSPIKE 0.4 (H) 03/09/2016   SPEI Comment 03/09/2016     STUDIES: No results found.  ASSESSMENT: Multiple Myeloma in relapse with multiple bony lytic lesions, end-stage renal disease.   PLAN:  1. Multiple Myeloma in relapse with multiple bony lytic lesions: Patient's kappa free light chains are continue to increase and she is more symptomatic likely secondary to progression of disease.  Will discontinue Kyporlis and get a restaging PET scan and MRI of the brain.  Initiate weekly Daratumumab. Daratumumab does not need to be dose reduced in the setting of end-stage renal disease and dialysis. Can consider Delton See given her renal failure. Because of patient's immigration status, she could not undergo transplant. 2. Back pain: Pain persists. Continue current narcotics as prescribed.  Take oxycodone as prescribed for severe breakthrough pain. 3. End-stage renal disease: Likely secondary to progression of disease. Continue dialysis on Monday, Wednesdays and Fridays. Has fistula. 4. Anemia: Hemoglobin decreased. Patient does not require transfusion at this time.  5. Hyperglycemia: Patient's blood glucose has improved since initiating insulin. Continue monitoring and treatment by primary care. Monitor closely with solumedrol as a pre-medication. 6. Thrombocytopenia:  Multifactorial secondary to treatment as well as disease progression. 7. Depression: Continue Celexa 20 mg daily. Patient was given a refill today. 8. Weight Loss: Stable without medication. Patient stopped taking Marinol due to making her "feel funny". She states that she has an improved appetite without the medication. Will continue to monitor. 9. Constipation: Resolved. Take OTC medications if needed. Monitor.  10. Pruritus: Resolved. Patient states Benadryl works, but only for several hours. Continue Atarax as needed. 11. Confusion/hallucinations: MRI brian as above.   Interpreter present during entire clinic visit.  Patient expressed understanding and was in agreement with this plan. She also understands that She can call clinic at any time with any questions, concerns, or complaints.    Lloyd Huger, MD 05/01/16 8:10 AM

## 2016-04-27 ENCOUNTER — Inpatient Hospital Stay: Payer: Self-pay

## 2016-04-27 ENCOUNTER — Inpatient Hospital Stay (HOSPITAL_BASED_OUTPATIENT_CLINIC_OR_DEPARTMENT_OTHER): Payer: Self-pay | Admitting: Oncology

## 2016-04-27 ENCOUNTER — Encounter: Payer: Self-pay | Admitting: Oncology

## 2016-04-27 VITALS — BP 110/58 | HR 93 | Temp 97.1°F | Wt 99.2 lb

## 2016-04-27 DIAGNOSIS — M1990 Unspecified osteoarthritis, unspecified site: Secondary | ICD-10-CM

## 2016-04-27 DIAGNOSIS — Z992 Dependence on renal dialysis: Secondary | ICD-10-CM

## 2016-04-27 DIAGNOSIS — D696 Thrombocytopenia, unspecified: Secondary | ICD-10-CM

## 2016-04-27 DIAGNOSIS — G8929 Other chronic pain: Secondary | ICD-10-CM

## 2016-04-27 DIAGNOSIS — R443 Hallucinations, unspecified: Secondary | ICD-10-CM

## 2016-04-27 DIAGNOSIS — F329 Major depressive disorder, single episode, unspecified: Secondary | ICD-10-CM

## 2016-04-27 DIAGNOSIS — M549 Dorsalgia, unspecified: Secondary | ICD-10-CM

## 2016-04-27 DIAGNOSIS — N186 End stage renal disease: Secondary | ICD-10-CM

## 2016-04-27 DIAGNOSIS — G4700 Insomnia, unspecified: Secondary | ICD-10-CM

## 2016-04-27 DIAGNOSIS — R634 Abnormal weight loss: Secondary | ICD-10-CM

## 2016-04-27 DIAGNOSIS — C9000 Multiple myeloma not having achieved remission: Secondary | ICD-10-CM

## 2016-04-27 DIAGNOSIS — Z8719 Personal history of other diseases of the digestive system: Secondary | ICD-10-CM

## 2016-04-27 DIAGNOSIS — Z8701 Personal history of pneumonia (recurrent): Secondary | ICD-10-CM

## 2016-04-27 DIAGNOSIS — R5383 Other fatigue: Secondary | ICD-10-CM

## 2016-04-27 DIAGNOSIS — R110 Nausea: Secondary | ICD-10-CM

## 2016-04-27 DIAGNOSIS — G629 Polyneuropathy, unspecified: Secondary | ICD-10-CM

## 2016-04-27 DIAGNOSIS — F419 Anxiety disorder, unspecified: Secondary | ICD-10-CM

## 2016-04-27 DIAGNOSIS — D649 Anemia, unspecified: Secondary | ICD-10-CM

## 2016-04-27 DIAGNOSIS — Z79899 Other long term (current) drug therapy: Secondary | ICD-10-CM

## 2016-04-27 DIAGNOSIS — R630 Anorexia: Secondary | ICD-10-CM

## 2016-04-27 DIAGNOSIS — R531 Weakness: Secondary | ICD-10-CM

## 2016-04-27 DIAGNOSIS — I129 Hypertensive chronic kidney disease with stage 1 through stage 4 chronic kidney disease, or unspecified chronic kidney disease: Secondary | ICD-10-CM

## 2016-04-27 DIAGNOSIS — C9002 Multiple myeloma in relapse: Secondary | ICD-10-CM

## 2016-04-27 DIAGNOSIS — E1122 Type 2 diabetes mellitus with diabetic chronic kidney disease: Secondary | ICD-10-CM

## 2016-04-27 DIAGNOSIS — R410 Disorientation, unspecified: Secondary | ICD-10-CM

## 2016-04-27 LAB — COMPREHENSIVE METABOLIC PANEL
ALK PHOS: 162 U/L — AB (ref 38–126)
ALT: 10 U/L — AB (ref 14–54)
AST: 29 U/L (ref 15–41)
Albumin: 2.7 g/dL — ABNORMAL LOW (ref 3.5–5.0)
Anion gap: 14 (ref 5–15)
BILIRUBIN TOTAL: 1 mg/dL (ref 0.3–1.2)
BUN: 27 mg/dL — AB (ref 6–20)
CALCIUM: 9.4 mg/dL (ref 8.9–10.3)
CHLORIDE: 95 mmol/L — AB (ref 101–111)
CO2: 26 mmol/L (ref 22–32)
CREATININE: 3.12 mg/dL — AB (ref 0.44–1.00)
GFR calc Af Amer: 19 mL/min — ABNORMAL LOW (ref >60)
GFR, EST NON AFRICAN AMERICAN: 16 mL/min — AB (ref >60)
Glucose, Bld: 176 mg/dL — ABNORMAL HIGH (ref 65–99)
Potassium: 4.3 mmol/L (ref 3.5–5.1)
Sodium: 135 mmol/L (ref 135–145)
Total Protein: 6.6 g/dL (ref 6.5–8.1)

## 2016-04-27 LAB — CBC WITH DIFFERENTIAL/PLATELET
BASOS ABS: 0 10*3/uL (ref 0–0.1)
Basophils Relative: 1 %
Eosinophils Absolute: 0 10*3/uL (ref 0–0.7)
Eosinophils Relative: 1 %
HEMATOCRIT: 25.3 % — AB (ref 35.0–47.0)
HEMOGLOBIN: 8.4 g/dL — AB (ref 12.0–16.0)
LYMPHS ABS: 0.7 10*3/uL — AB (ref 1.0–3.6)
LYMPHS PCT: 15 %
MCH: 32 pg (ref 26.0–34.0)
MCHC: 33.1 g/dL (ref 32.0–36.0)
MCV: 96.8 fL (ref 80.0–100.0)
Monocytes Absolute: 0.8 10*3/uL (ref 0.2–0.9)
Monocytes Relative: 16 %
NEUTROS ABS: 3.1 10*3/uL (ref 1.4–6.5)
NEUTROS PCT: 67 %
PLATELETS: 170 10*3/uL (ref 150–440)
RBC: 2.62 MIL/uL — AB (ref 3.80–5.20)
RDW: 20.5 % — ABNORMAL HIGH (ref 11.5–14.5)
WBC: 4.7 10*3/uL (ref 3.6–11.0)

## 2016-04-27 MED ORDER — HEPARIN SOD (PORK) LOCK FLUSH 100 UNIT/ML IV SOLN
500.0000 [IU] | Freq: Once | INTRAVENOUS | Status: AC
  Administered 2016-04-27: 500 [IU] via INTRAVENOUS

## 2016-04-27 MED ORDER — SODIUM CHLORIDE 0.9% FLUSH
10.0000 mL | Freq: Once | INTRAVENOUS | Status: AC
  Administered 2016-04-27: 10 mL via INTRAVENOUS
  Filled 2016-04-27: qty 10

## 2016-04-28 ENCOUNTER — Inpatient Hospital Stay: Payer: Self-pay

## 2016-04-28 LAB — PROTEIN ELECTROPHORESIS, SERUM
A/G RATIO SPE: 0.9 (ref 0.7–1.7)
ALBUMIN ELP: 2.8 g/dL — AB (ref 2.9–4.4)
Alpha-1-Globulin: 0.3 g/dL (ref 0.0–0.4)
Alpha-2-Globulin: 0.9 g/dL (ref 0.4–1.0)
Beta Globulin: 1.4 g/dL — ABNORMAL HIGH (ref 0.7–1.3)
GLOBULIN, TOTAL: 3 (ref 2.2–3.9)
Gamma Globulin: 0.4 g/dL (ref 0.4–1.8)
M-Spike, %: 0.8 g/dL — ABNORMAL HIGH
Total Protein ELP: 5.8 g/dL — ABNORMAL LOW (ref 6.0–8.5)

## 2016-04-28 LAB — KAPPA/LAMBDA LIGHT CHAINS
KAPPA, LAMDA LIGHT CHAIN RATIO: 1194.23 — AB (ref 0.26–1.65)
Kappa free light chain: 20063.1 mg/L — ABNORMAL HIGH (ref 3.3–19.4)
LAMDA FREE LIGHT CHAINS: 16.8 mg/L (ref 5.7–26.3)

## 2016-04-29 ENCOUNTER — Other Ambulatory Visit: Payer: Self-pay

## 2016-04-29 DIAGNOSIS — C9000 Multiple myeloma not having achieved remission: Secondary | ICD-10-CM

## 2016-04-30 ENCOUNTER — Ambulatory Visit
Admission: RE | Admit: 2016-04-30 | Discharge: 2016-04-30 | Disposition: A | Discharge status: Home / Self Care | Payer: Self-pay | Source: Ambulatory Visit | Attending: Oncology | Admitting: Oncology

## 2016-04-30 DIAGNOSIS — J90 Pleural effusion, not elsewhere classified: Secondary | ICD-10-CM | POA: Insufficient documentation

## 2016-04-30 DIAGNOSIS — C9002 Multiple myeloma in relapse: Secondary | ICD-10-CM

## 2016-04-30 DIAGNOSIS — C9000 Multiple myeloma not having achieved remission: Secondary | ICD-10-CM

## 2016-04-30 LAB — GLUCOSE, CAPILLARY: GLUCOSE-CAPILLARY: 133 mg/dL — AB (ref 65–99)

## 2016-04-30 MED ORDER — FLUDEOXYGLUCOSE F - 18 (FDG) INJECTION
12.1600 | Freq: Once | INTRAVENOUS | Status: AC | PRN
  Administered 2016-04-30: 12.16 via INTRAVENOUS

## 2016-05-01 DIAGNOSIS — C9002 Multiple myeloma in relapse: Secondary | ICD-10-CM | POA: Insufficient documentation

## 2016-05-01 MED ORDER — ACYCLOVIR 400 MG PO TABS: 400.0000 mg | ORAL_TABLET | Freq: Two times a day (BID) | ORAL | 11 refills | Status: DC

## 2016-05-03 NOTE — Progress Notes (Signed)
[place]  [phone] [phone]) [phone]  ID: Jamie Keller OB: 06-05-1965  MR#: [id number]  QHU#:[id number]  [name], MD as PCP - General (Family Medicine)  CHIEF COMPLAINT: Multiple Myeloma in relapse with multiple bony lytic lesions, now with end-stage renal disease on dialysis.   INTERVAL HISTORY: Patient returns to clinic today for further evaluation, discussion of her imaging results, and initiation of single agent daratumumab. Her performance status is declining. She continues to feel chronically weak and fatigued. Her pain is unchanged. She denies any fever or chills. She has a poor appetite, but her weight has remained stable.  She continues to have chronic back pain that would occasionally radiate down her leg.  She has dyspnea on exertion, but denies cough, hemoptysis, or chest pain. She complains of mild nausea and occasional vomiting. Patient offers no further specific complaints today.   REVIEW OF SYSTEMS:   Review of Systems  Constitutional: Positive for malaise/fatigue. Negative for chills, fever and weight loss.  HENT: Negative for congestion.   Respiratory: Positive for shortness of breath. Negative for cough.   Cardiovascular: Negative.  Negative for chest pain and leg swelling.  Gastrointestinal: Positive for nausea and vomiting. Negative for abdominal pain, blood in stool, constipation and diarrhea.  Genitourinary: Negative for hematuria.       On dialysis  Musculoskeletal: Positive for back pain.  Skin: Negative for itching and rash.  Neurological: Positive for tingling, sensory change and weakness. Negative for focal weakness and headaches.  Psychiatric/Behavioral: The patient is nervous/anxious and has insomnia.    As per HPI. Otherwise, a complete review of systems is negative.   PAST MEDICAL HISTORY: Past Medical History:  Diagnosis Date  . Anemia   . Anxiety   . Chronic kidney  disease    DIALYSIS  . Depression   . Diabetes mellitus without complication ([place])   . Dialysis patient ([place])    Mon, Wed, Fri  . History of chemotherapy    chemo on Tuesday and Wednesday  . Hypertension   . Multiple myeloma ([place])   . Neuropathy associated with cancer ([place])   . Osteoarthritis   . Pancreatitis   . Pneumonia    September 2017, [facility]  . Post-menopausal 2014  . Sepsis ([place])     PAST SURGICAL HISTORY: Past Surgical History:  Procedure Laterality Date  . AV FISTULA PLACEMENT Left 02/20/2016   Procedure: INSERTION OF ARTERIOVENOUS (AV) GORE-TEX GRAFT ARM;  Surgeon: Katha Cabal, MD;  Location: [facility] ORS;  Service: Vascular;  Laterality: Left;  . CESAREAN SECTION     x2  . dialysis catheter placement    . PERIPHERAL VASCULAR CATHETERIZATION N/A 11/19/2015   Procedure: Dialysis/Perma Catheter Insertion;  Surgeon: Algernon Huxley, MD;  Location: [place] [facility];  Service: Cardiovascular;  Laterality: N/A;  . PERIPHERAL VASCULAR CATHETERIZATION Left 03/10/2016   Procedure: Thrombectomy;  Surgeon: Katha Cabal, MD;  Location: [place] [facility];  Service: Cardiovascular;  Laterality: Left;  . PORTACATH PLACEMENT    . TUBAL LIGATION      FAMILY HISTORY: Reviewed and unchanged. No reported history of malignancy or chronic disease.     ADVANCED DIRECTIVES:    HEALTH MAINTENANCE: Social History  Substance Use Topics  . Smoking status: Never Smoker  . Smokeless tobacco: Never Used  . Alcohol use No     No Known Allergies  Current Outpatient Prescriptions  Medication Sig Dispense Refill  . acetaminophen (TYLENOL) 500 MG tablet  Take 500 mg by mouth every 6 (six) hours as needed.    . citalopram (CELEXA) 20 MG tablet Take 1 tablet (20 mg total) by mouth daily. 30 tablet 5  . HYDROcodone-acetaminophen (NORCO) 5-325 MG tablet Take 1-2 tablets by mouth every 6 (six) hours as needed for moderate pain or severe pain. 50 tablet 0  . Insulin Detemir (LEVEMIR  FLEXPEN) 100 UNIT/ML Pen Inject 5 Units into the skin daily at 10 pm.    . VERAPAMIL HCL ER PO Take 120 mg by mouth daily.    Marland Kitchen dronabinol (MARINOL) 2.5 MG capsule Take 1 capsule (2.5 mg total) by mouth 2 (two) times daily before a meal. (Patient not taking: Reported on 03/16/2016) 60 capsule 0  . ferrous sulfate 325 (65 FE) MG tablet Take 325 mg by mouth daily with breakfast.    . furosemide (LASIX) 80 MG tablet Take 1 tablet (80 mg total) by mouth daily. (Patient not taking: Reported on 03/16/2016) 30 tablet 0  . promethazine (PHENERGAN) 25 MG tablet Take 25 mg by mouth every 6 (six) hours as needed for nausea or vomiting.     No current facility-administered medications for this visit.    Facility-Administered Medications Ordered in Other Visits  Medication Dose Route Frequency Provider Last Rate Last Dose  . heparin lock flush 100 unit/mL  500 Units Intravenous Once Lloyd Huger, MD      . sodium chloride flush (NS) 0.9 % injection 10 mL  10 mL Intravenous Once Lloyd Huger, MD        OBJECTIVE: Vitals:   03/16/16 0937  BP: (!) 144/69  Pulse: 98  Resp: 18  Temp: 97.5 F (36.4 C)     Body mass index is 19.33 kg/m.    ECOG FS:1 - Symptomatic  General: No acute distress. Eyes: anicteric sclera. Lungs: Clear to auscultation bilaterally. Heart: Regular rate and rhythm. No rubs, murmurs, or gallops. Abdomen: Soft, nontender, nondistended. No organomegaly noted, normoactive bowel sounds. Musculoskeletal: No edema, cyanosis, or clubbing. Neuro: Alert, answering all questions appropriately. Cranial nerves grossly intact. Skin: No rashes or petechiae noted.  Psych: Normal affect.   LAB RESULTS:  Lab Results  Component Value Date   NA 135 03/16/2016   K 3.9 03/16/2016   CL 97 (L) 03/16/2016   CO2 27 03/16/2016   GLUCOSE 166 (H) 03/16/2016   BUN 25 (H) 03/16/2016   CREATININE 3.31 (H) 03/16/2016   CALCIUM 9.2 03/16/2016   PROT 6.2 (L) 03/16/2016   ALBUMIN 2.7 (L)  03/16/2016   AST 29 03/16/2016   ALT 10 (L) 03/16/2016   ALKPHOS 167 (H) 03/16/2016   BILITOT 1.3 (H) 03/16/2016   GFRNONAA 15 (L) 03/16/2016   GFRAA 17 (L) 03/16/2016    Lab Results  Component Value Date   WBC 6.0 03/16/2016   NEUTROABS 4.0 03/16/2016   HGB 9.2 (L) 03/16/2016   HCT 27.6 (L) 03/16/2016   MCV 93.6 03/16/2016   PLT 136 (L) 03/16/2016   Lab Results  Component Value Date   TOTALPROTELP 5.4 (L) 03/09/2016   ALBUMINELP 2.8 (L) 03/09/2016   A1GS 0.3 03/09/2016   A2GS 0.8 03/09/2016   BETS 0.9 03/09/2016   GAMS 0.4 03/09/2016   MSPIKE 0.4 (H) 03/09/2016   SPEI Comment 03/09/2016     STUDIES: No results found.  ASSESSMENT: Multiple Myeloma in relapse with multiple bony lytic lesions, end-stage renal disease.   PLAN:    1. Multiple Myeloma in relapse with multiple bony lytic  lesions: PET scan results reviewed independently consistent with progression of disease. Patient's kappa free light chains continue to increase and she is more symptomatic. Initiate weekly Daratumumab today. Daratumumab does not need to be dose reduced in the setting of end-stage renal disease and dialysis. Can consider Delton See given her renal failure. Because of patient's immigration status, she could not undergo transplant. Return to clinic in 1 week for consideration of cycle 2 of daratumumab. 2. Back pain: Continue current narcotics as prescribed.  3. End-stage renal disease: Likely secondary to progression of disease. Continue dialysis on Monday, Wednesdays and Fridays. Has fistula. 4. Anemia: Hemoglobin decreased. Patient does not require transfusion at this time.  5. Hyperglycemia: Patient's blood glucose has improved since initiating insulin. Continue monitoring and treatment by primary care. Monitor closely with solumedrol as a pre-medication. 6. Thrombocytopenia: Multifactorial secondary to treatment as well as disease progression. 7. Depression: Continue Celexa 20 mg daily. Patient was  given a refill today. 8. Weight Loss: Stable without medication. Patient stopped taking Marinol due to making her "feel funny". She states that she has an improved appetite without the medication. Will continue to monitor. 9. Constipation: Resolved. Take OTC medications if needed. Monitor.  10. Pruritus: Resolved. Patient states Benadryl works, but only for several hours. Continue Atarax as needed. 11. Confusion/hallucinations: MRI brian is negative.   Interpreter present during entire clinic visit.  Patient expressed understanding and was in agreement with this plan. She also understands that She can call clinic at any time with any questions, concerns, or complaints.    Lloyd Huger, MD 05/03/16 11:12 PM

## 2016-05-04 ENCOUNTER — Inpatient Hospital Stay (HOSPITAL_BASED_OUTPATIENT_CLINIC_OR_DEPARTMENT_OTHER): Payer: Self-pay | Admitting: Oncology

## 2016-05-04 ENCOUNTER — Inpatient Hospital Stay: Payer: Self-pay

## 2016-05-04 VITALS — BP 115/58 | HR 94 | Temp 97.5°F | Wt 100.1 lb

## 2016-05-04 VITALS — BP 125/56 | HR 99 | Resp 20

## 2016-05-04 DIAGNOSIS — Z992 Dependence on renal dialysis: Secondary | ICD-10-CM

## 2016-05-04 DIAGNOSIS — G8929 Other chronic pain: Secondary | ICD-10-CM

## 2016-05-04 DIAGNOSIS — R110 Nausea: Secondary | ICD-10-CM

## 2016-05-04 DIAGNOSIS — M1990 Unspecified osteoarthritis, unspecified site: Secondary | ICD-10-CM

## 2016-05-04 DIAGNOSIS — R5383 Other fatigue: Secondary | ICD-10-CM

## 2016-05-04 DIAGNOSIS — Z8701 Personal history of pneumonia (recurrent): Secondary | ICD-10-CM

## 2016-05-04 DIAGNOSIS — R443 Hallucinations, unspecified: Secondary | ICD-10-CM

## 2016-05-04 DIAGNOSIS — E1122 Type 2 diabetes mellitus with diabetic chronic kidney disease: Secondary | ICD-10-CM

## 2016-05-04 DIAGNOSIS — C9002 Multiple myeloma in relapse: Secondary | ICD-10-CM

## 2016-05-04 DIAGNOSIS — M549 Dorsalgia, unspecified: Secondary | ICD-10-CM

## 2016-05-04 DIAGNOSIS — D649 Anemia, unspecified: Secondary | ICD-10-CM

## 2016-05-04 DIAGNOSIS — F329 Major depressive disorder, single episode, unspecified: Secondary | ICD-10-CM

## 2016-05-04 DIAGNOSIS — G4700 Insomnia, unspecified: Secondary | ICD-10-CM

## 2016-05-04 DIAGNOSIS — G629 Polyneuropathy, unspecified: Secondary | ICD-10-CM

## 2016-05-04 DIAGNOSIS — Z8719 Personal history of other diseases of the digestive system: Secondary | ICD-10-CM

## 2016-05-04 DIAGNOSIS — Z79899 Other long term (current) drug therapy: Secondary | ICD-10-CM

## 2016-05-04 DIAGNOSIS — F419 Anxiety disorder, unspecified: Secondary | ICD-10-CM

## 2016-05-04 DIAGNOSIS — I129 Hypertensive chronic kidney disease with stage 1 through stage 4 chronic kidney disease, or unspecified chronic kidney disease: Secondary | ICD-10-CM

## 2016-05-04 DIAGNOSIS — R410 Disorientation, unspecified: Secondary | ICD-10-CM

## 2016-05-04 DIAGNOSIS — R531 Weakness: Secondary | ICD-10-CM

## 2016-05-04 DIAGNOSIS — D696 Thrombocytopenia, unspecified: Secondary | ICD-10-CM

## 2016-05-04 DIAGNOSIS — N186 End stage renal disease: Secondary | ICD-10-CM

## 2016-05-04 LAB — CBC WITH DIFFERENTIAL/PLATELET
Basophils Absolute: 0 10*3/uL (ref 0–0.1)
Basophils Relative: 1 %
EOS PCT: 0 %
Eosinophils Absolute: 0 10*3/uL (ref 0–0.7)
HCT: 24.3 % — ABNORMAL LOW (ref 35.0–47.0)
Hemoglobin: 8.2 g/dL — ABNORMAL LOW (ref 12.0–16.0)
LYMPHS ABS: 0.8 10*3/uL — AB (ref 1.0–3.6)
LYMPHS PCT: 15 %
MCH: 33.2 pg (ref 26.0–34.0)
MCHC: 33.7 g/dL (ref 32.0–36.0)
MCV: 98.5 fL (ref 80.0–100.0)
MONO ABS: 0.8 10*3/uL (ref 0.2–0.9)
Monocytes Relative: 17 %
Neutro Abs: 3.4 10*3/uL (ref 1.4–6.5)
Neutrophils Relative %: 67 %
PLATELETS: 152 10*3/uL (ref 150–440)
RBC: 2.47 MIL/uL — AB (ref 3.80–5.20)
RDW: 20.6 % — ABNORMAL HIGH (ref 11.5–14.5)
WBC: 5.1 10*3/uL (ref 3.6–11.0)

## 2016-05-04 LAB — COMPREHENSIVE METABOLIC PANEL
ALT: 9 U/L — AB (ref 14–54)
AST: 28 U/L (ref 15–41)
Albumin: 2.7 g/dL — ABNORMAL LOW (ref 3.5–5.0)
Alkaline Phosphatase: 171 U/L — ABNORMAL HIGH (ref 38–126)
Anion gap: 11 (ref 5–15)
BILIRUBIN TOTAL: 1 mg/dL (ref 0.3–1.2)
BUN: 37 mg/dL — ABNORMAL HIGH (ref 6–20)
CALCIUM: 9.6 mg/dL (ref 8.9–10.3)
CHLORIDE: 95 mmol/L — AB (ref 101–111)
CO2: 29 mmol/L (ref 22–32)
CREATININE: 3.62 mg/dL — AB (ref 0.44–1.00)
GFR, EST AFRICAN AMERICAN: 16 mL/min — AB (ref >60)
GFR, EST NON AFRICAN AMERICAN: 14 mL/min — AB (ref >60)
Glucose, Bld: 162 mg/dL — ABNORMAL HIGH (ref 65–99)
Potassium: 4.6 mmol/L (ref 3.5–5.1)
Sodium: 135 mmol/L (ref 135–145)
Total Protein: 6.7 g/dL (ref 6.5–8.1)

## 2016-05-04 MED ORDER — PROCHLORPERAZINE MALEATE 10 MG PO TABS
10.0000 mg | ORAL_TABLET | Freq: Once | ORAL | Status: AC
  Administered 2016-05-04: 10 mg via ORAL
  Filled 2016-05-04: qty 1

## 2016-05-04 MED ORDER — SODIUM CHLORIDE 0.9 % IV SOLN
Freq: Once | INTRAVENOUS | Status: AC
  Administered 2016-05-04: 10:00:00 via INTRAVENOUS
  Filled 2016-05-04: qty 1000

## 2016-05-04 MED ORDER — ACETAMINOPHEN 325 MG PO TABS
650.0000 mg | ORAL_TABLET | Freq: Once | ORAL | Status: AC
  Administered 2016-05-04: 650 mg via ORAL
  Filled 2016-05-04: qty 2

## 2016-05-04 MED ORDER — DIPHENHYDRAMINE HCL 25 MG PO CAPS
25.0000 mg | ORAL_CAPSULE | Freq: Once | ORAL | Status: AC
  Administered 2016-05-04: 25 mg via ORAL
  Filled 2016-05-04: qty 1

## 2016-05-04 MED ORDER — HEPARIN SOD (PORK) LOCK FLUSH 100 UNIT/ML IV SOLN
500.0000 [IU] | Freq: Once | INTRAVENOUS | Status: AC | PRN
  Administered 2016-05-04: 500 [IU]
  Filled 2016-05-04: qty 5

## 2016-05-04 MED ORDER — METHYLPREDNISOLONE SODIUM SUCC 125 MG IJ SOLR
125.0000 mg | Freq: Once | INTRAMUSCULAR | Status: AC
  Administered 2016-05-04: 125 mg via INTRAVENOUS
  Filled 2016-05-04: qty 2

## 2016-05-04 MED ORDER — SODIUM CHLORIDE 0.9 % IV SOLN
Freq: Once | INTRAVENOUS | Status: AC
  Administered 2016-05-04: 14:00:00 via INTRAVENOUS
  Filled 2016-05-04: qty 4

## 2016-05-04 MED ORDER — SODIUM CHLORIDE 0.9% FLUSH
10.0000 mL | INTRAVENOUS | Status: DC | PRN
Start: 2016-05-04 — End: 2016-05-04
  Administered 2016-05-04: 10 mL
  Filled 2016-05-04: qty 10

## 2016-05-04 MED ORDER — SODIUM CHLORIDE 0.9 % IV SOLN
16.0000 mg/kg | Freq: Once | INTRAVENOUS | Status: AC
  Administered 2016-05-04: 720 mg via INTRAVENOUS
  Filled 2016-05-04: qty 36

## 2016-05-04 NOTE — Progress Notes (Signed)
Patient c/o weakness.  States she vomited yesterday.  No pain today.

## 2016-05-04 NOTE — Progress Notes (Signed)
Patient having Nausea/Vomiting while eating her lunch. VSS. MD, Dr. Grayland Ormond, notified and aware. Per MD order: order for Zofran 8mg  IVPB once entered to [facility]. Continue with treatment.

## 2016-05-05 ENCOUNTER — Other Ambulatory Visit: Payer: Self-pay

## 2016-05-05 LAB — TYPE AND SCREEN
ABO/RH(D): B POS
ANTIBODY SCREEN: NEGATIVE

## 2016-05-05 LAB — KAPPA/LAMBDA LIGHT CHAINS
Kappa free light chain: 38664 mg/L — ABNORMAL HIGH (ref 3.3–19.4)
Kappa, lambda light chain ratio: 2560.53 — ABNORMAL HIGH (ref 0.26–1.65)
Lambda free light chains: 15.1 mg/L (ref 5.7–26.3)

## 2016-05-05 LAB — IGG, IGA, IGM
IGG (IMMUNOGLOBIN G), SERUM: 515 mg/dL — AB (ref 700–1600)
IgA: 27 mg/dL — ABNORMAL LOW (ref 87–352)
IgM, Serum: 8 mg/dL — ABNORMAL LOW (ref 26–217)

## 2016-05-05 LAB — PROTEIN ELECTROPHORESIS, SERUM
A/G Ratio: 0.9 (ref 0.7–1.7)
ALBUMIN ELP: 2.9 g/dL (ref 2.9–4.4)
Alpha-1-Globulin: 0.3 g/dL (ref 0.0–0.4)
Alpha-2-Globulin: 1 g/dL (ref 0.4–1.0)
BETA GLOBULIN: 1.5 g/dL — AB (ref 0.7–1.3)
GAMMA GLOBULIN: 0.4 g/dL (ref 0.4–1.8)
Globulin, Total: 3.2 g/dL (ref 2.2–3.9)
M-Spike, %: 0.8 g/dL — ABNORMAL HIGH
Total Protein ELP: 6.1 g/dL (ref 6.0–8.5)

## 2016-05-10 NOTE — Progress Notes (Signed)
[place]  Telephone:(336) [phone] Fax:(336) [phone]  ID: Jamie Keller OB: 1965/06/30  MR#: [id number]  PIR#:[id number]  [name], MD as PCP - General (Family Medicine)  CHIEF COMPLAINT: Multiple Myeloma in relapse with multiple bony lytic lesions, now with end-stage renal disease on dialysis.   INTERVAL HISTORY: Patient returns to clinic today for further evaluation and consideration of cycle 2 of single agent daratumumab. She tolerated her first treatment well without significant side effects. Her performance status is declining. She continues to feel chronically weak and fatigued. Her pain is unchanged. She denies any fever or chills. She has a poor appetite, but her weight has remained stable.  She continues to have chronic back pain that would occasionally radiate down her leg.  She has dyspnea on exertion, but denies cough, hemoptysis, or chest pain. She complains of mild nausea and occasional vomiting. Patient offers no further specific complaints today.   REVIEW OF SYSTEMS:   Review of Systems  Constitutional: Positive for malaise/fatigue. Negative for chills, fever and weight loss.  HENT: Negative for congestion.   Respiratory: Positive for shortness of breath. Negative for cough.   Cardiovascular: Negative.  Negative for chest pain and leg swelling.  Gastrointestinal: Positive for nausea and vomiting. Negative for abdominal pain, blood in stool, constipation and diarrhea.  Genitourinary: Negative for hematuria.       On dialysis  Musculoskeletal: Positive for back pain.  Skin: Negative for itching and rash.  Neurological: Positive for tingling, sensory change and weakness. Negative for focal weakness and headaches.  Psychiatric/Behavioral: The patient is nervous/anxious and has insomnia.    As per HPI. Otherwise, a complete review of systems is negative.   PAST MEDICAL HISTORY: Past Medical History:  Diagnosis  Date  . Anemia   . Anxiety   . Chronic kidney disease    DIALYSIS  . Depression   . Diabetes mellitus without complication ([place])   . Dialysis patient ([place])    Mon, Wed, Fri  . History of chemotherapy    chemo on Tuesday and Wednesday  . Hypertension   . Multiple myeloma ([place])   . Neuropathy associated with cancer ([place])   . Osteoarthritis   . Pancreatitis   . Pneumonia    September 2017, [facility]  . Post-menopausal 2014  . Sepsis ([place])     PAST SURGICAL HISTORY: Past Surgical History:  Procedure Laterality Date  . AV FISTULA PLACEMENT Left 02/20/2016   Procedure: INSERTION OF ARTERIOVENOUS (AV) GORE-TEX GRAFT ARM;  Surgeon: Katha Cabal, MD;  Location: [facility] ORS;  Service: Vascular;  Laterality: Left;  . CESAREAN SECTION     x2  . dialysis catheter placement    . PERIPHERAL VASCULAR CATHETERIZATION N/A 11/19/2015   Procedure: Dialysis/Perma Catheter Insertion;  Surgeon: Algernon Huxley, MD;  Location: [place] [facility];  Service: Cardiovascular;  Laterality: N/A;  . PERIPHERAL VASCULAR CATHETERIZATION Left 03/10/2016   Procedure: Thrombectomy;  Surgeon: Katha Cabal, MD;  Location: [place] CV LAB;  Service: Cardiovascular;  Laterality: Left;  . PORTACATH PLACEMENT    . TUBAL LIGATION      FAMILY HISTORY: Reviewed and unchanged. No reported history of malignancy or chronic disease.     ADVANCED DIRECTIVES:    HEALTH MAINTENANCE: Social History  Substance Use Topics  . Smoking status: Never Smoker  . Smokeless tobacco: Never Used  . Alcohol use No     No Known Allergies  Current Outpatient Prescriptions  Medication Sig Dispense  Refill  . acetaminophen (TYLENOL) 500 MG tablet Take 500 mg by mouth every 6 (six) hours as needed.    . citalopram (CELEXA) 20 MG tablet Take 1 tablet (20 mg total) by mouth daily. 30 tablet 5  . HYDROcodone-acetaminophen (NORCO) 5-325 MG tablet Take 1-2 tablets by mouth every 6 (six) hours as needed for moderate pain or severe  pain. 50 tablet 0  . Insulin Detemir (LEVEMIR FLEXPEN) 100 UNIT/ML Pen Inject 5 Units into the skin daily at 10 pm.    . VERAPAMIL HCL ER PO Take 120 mg by mouth daily.    Marland Kitchen dronabinol (MARINOL) 2.5 MG capsule Take 1 capsule (2.5 mg total) by mouth 2 (two) times daily before a meal. (Patient not taking: Reported on 03/16/2016) 60 capsule 0  . ferrous sulfate 325 (65 FE) MG tablet Take 325 mg by mouth daily with breakfast.    . furosemide (LASIX) 80 MG tablet Take 1 tablet (80 mg total) by mouth daily. (Patient not taking: Reported on 03/16/2016) 30 tablet 0  . promethazine (PHENERGAN) 25 MG tablet Take 25 mg by mouth every 6 (six) hours as needed for nausea or vomiting.     No current facility-administered medications for this visit.    Facility-Administered Medications Ordered in Other Visits  Medication Dose Route Frequency Provider Last Rate Last Dose  . heparin lock flush 100 unit/mL  500 Units Intravenous Once Lloyd Huger, MD      . sodium chloride flush (NS) 0.9 % injection 10 mL  10 mL Intravenous Once Lloyd Huger, MD        OBJECTIVE: Vitals:   03/16/16 0937  BP: (!) 144/69  Pulse: 98  Resp: 18  Temp: 97.5 F (36.4 C)     Body mass index is 19.33 kg/m.    ECOG FS:1 - Symptomatic  General: No acute distress. Eyes: anicteric sclera. Lungs: Clear to auscultation bilaterally. Heart: Regular rate and rhythm. No rubs, murmurs, or gallops. Abdomen: Soft, nontender, nondistended. No organomegaly noted, normoactive bowel sounds. Musculoskeletal: No edema, cyanosis, or clubbing. Neuro: Alert, answering all questions appropriately. Cranial nerves grossly intact. Skin: No rashes or petechiae noted.  Psych: Normal affect.   LAB RESULTS:  CBC    Component Value Date/Time   WBC 3.6 05/11/2016 0904   RBC 2.58 (L) 05/11/2016 0904   HGB 8.6 (L) 05/11/2016 0904   HGB 11.7 (L) 06/27/2014 1344   HCT 25.4 (L) 05/11/2016 0904   HCT 34.0 (L) 06/27/2014 1344   PLT 108 (L)  05/11/2016 0904   PLT 267 06/27/2014 1344   MCV 98.4 05/11/2016 0904   MCV 88 06/27/2014 1344   MCH 33.4 05/11/2016 0904   MCHC 33.9 05/11/2016 0904   RDW 21.4 (H) 05/11/2016 0904   RDW 13.4 06/27/2014 1344   LYMPHSABS 0.4 (L) 05/11/2016 0904   LYMPHSABS 0.9 (L) 06/27/2014 1344   MONOABS 0.5 05/11/2016 0904   MONOABS 0.9 06/27/2014 1344   EOSABS 0.1 05/11/2016 0904   EOSABS 0.2 06/27/2014 1344   BASOSABS 0.0 05/11/2016 0904   BASOSABS 0.1 06/27/2014 1344   BMET    Component Value Date/Time   NA 135 05/11/2016 0904   NA 137 06/27/2014 1344   K 4.6 05/11/2016 0904   K 4.0 06/27/2014 1344   CL 95 (L) 05/11/2016 0904   CL 106 06/27/2014 1344   CO2 29 05/11/2016 0904   CO2 26 06/27/2014 1344   GLUCOSE 153 (H) 05/11/2016 0904   GLUCOSE 83 06/27/2014 1344  BUN 59 (H) 05/11/2016 0904   BUN 27 (H) 06/27/2014 1344   CREATININE 3.60 (H) 05/11/2016 0904   CREATININE 1.36 (H) 06/27/2014 1344   CALCIUM 8.6 (L) 05/11/2016 0904   CALCIUM 9.1 06/27/2014 1344   GFRNONAA 14 (L) 05/11/2016 0904   GFRNONAA 46 (L) 06/27/2014 1344   GFRAA 16 (L) 05/11/2016 0904   GFRAA 53 (L) 06/27/2014 1344   Lab Results  Component Value Date   TOTALPROTELP 5.2 (L) 05/11/2016   ALBUMINELP 2.8 (L) 05/11/2016   A1GS 0.4 05/11/2016   A2GS 0.8 05/11/2016   BETS 0.9 05/11/2016   GAMS 0.4 05/11/2016   MSPIKE Not Observed 05/11/2016   SPEI Comment 05/11/2016        STUDIES: No results found.  ASSESSMENT: Multiple Myeloma in relapse with multiple bony lytic lesions, end-stage renal disease.   PLAN:    1. Multiple Myeloma in relapse with multiple bony lytic lesions: PET scan results reviewed independently consistent with progression of disease. Patient's kappa free light chains have trended down significantly after her first infusion of single agent Daratumumab today. Daratumumab does not need to be dose reduced in the setting of end-stage renal disease and dialysis. Can consider Delton See given her  renal failure. Because of patient's immigration status, she could not undergo transplant. Proceed with cycle 2 today. Return to clinic in 1 week for consideration of cycle 3 of 8 of weekly daratumumab. After the patient receives 8 weekly cycles, we will switch to treatment every 2 weeks for an additional 8 cycles. At that point patient can be switched to maintenance monthly regimen. 2. Back pain: Continue current narcotics as prescribed.  3. End-stage renal disease: Likely secondary to progression of disease. Continue dialysis on Monday, Wednesdays and Fridays. Has fistula. 4. Anemia: Hemoglobin decreased. Patient does not require transfusion at this time.  5. Hyperglycemia: Patient's blood glucose has improved since initiating insulin. Continue monitoring and treatment by primary care. Monitor closely with solumedrol as a pre-medication. 6. Thrombocytopenia: Multifactorial secondary to treatment as well as disease progression. 7. Depression: Continue Celexa 20 mg daily. Patient was given a refill today. 8. Weight Loss: Stable without medication. Patient stopped taking Marinol due to making her "feel funny". She states that she has an improved appetite without the medication. Will continue to monitor. 9. Constipation: Resolved. Take OTC medications if needed. Monitor.  10. Pruritus: Resolved. Patient states Benadryl works, but only for several hours. Continue Atarax as needed. 11. Confusion/hallucinations: MRI brian is negative.   Interpreter present during entire clinic visit.  Patient expressed understanding and was in agreement with this plan. She also understands that She can call clinic at any time with any questions, concerns, or complaints.    Lloyd Huger, MD 05/14/16 4:41 PM

## 2016-05-11 ENCOUNTER — Inpatient Hospital Stay: Payer: Self-pay

## 2016-05-11 ENCOUNTER — Inpatient Hospital Stay: Payer: Self-pay | Attending: Oncology | Admitting: Oncology

## 2016-05-11 VITALS — BP 130/71 | HR 82 | Temp 97.2°F | Resp 1 | Wt 102.7 lb

## 2016-05-11 DIAGNOSIS — M1990 Unspecified osteoarthritis, unspecified site: Secondary | ICD-10-CM | POA: Insufficient documentation

## 2016-05-11 DIAGNOSIS — Z8701 Personal history of pneumonia (recurrent): Secondary | ICD-10-CM | POA: Insufficient documentation

## 2016-05-11 DIAGNOSIS — R5383 Other fatigue: Secondary | ICD-10-CM | POA: Insufficient documentation

## 2016-05-11 DIAGNOSIS — F329 Major depressive disorder, single episode, unspecified: Secondary | ICD-10-CM | POA: Insufficient documentation

## 2016-05-11 DIAGNOSIS — D649 Anemia, unspecified: Secondary | ICD-10-CM | POA: Insufficient documentation

## 2016-05-11 DIAGNOSIS — C9002 Multiple myeloma in relapse: Secondary | ICD-10-CM | POA: Insufficient documentation

## 2016-05-11 DIAGNOSIS — N186 End stage renal disease: Secondary | ICD-10-CM | POA: Insufficient documentation

## 2016-05-11 DIAGNOSIS — M545 Low back pain: Secondary | ICD-10-CM | POA: Insufficient documentation

## 2016-05-11 DIAGNOSIS — G4700 Insomnia, unspecified: Secondary | ICD-10-CM | POA: Insufficient documentation

## 2016-05-11 DIAGNOSIS — F419 Anxiety disorder, unspecified: Secondary | ICD-10-CM | POA: Insufficient documentation

## 2016-05-11 DIAGNOSIS — R0602 Shortness of breath: Secondary | ICD-10-CM | POA: Insufficient documentation

## 2016-05-11 DIAGNOSIS — R531 Weakness: Secondary | ICD-10-CM | POA: Insufficient documentation

## 2016-05-11 DIAGNOSIS — Z5111 Encounter for antineoplastic chemotherapy: Secondary | ICD-10-CM | POA: Insufficient documentation

## 2016-05-11 DIAGNOSIS — R112 Nausea with vomiting, unspecified: Secondary | ICD-10-CM | POA: Insufficient documentation

## 2016-05-11 DIAGNOSIS — R410 Disorientation, unspecified: Secondary | ICD-10-CM | POA: Insufficient documentation

## 2016-05-11 DIAGNOSIS — Z8719 Personal history of other diseases of the digestive system: Secondary | ICD-10-CM | POA: Insufficient documentation

## 2016-05-11 DIAGNOSIS — E1122 Type 2 diabetes mellitus with diabetic chronic kidney disease: Secondary | ICD-10-CM | POA: Insufficient documentation

## 2016-05-11 DIAGNOSIS — R630 Anorexia: Secondary | ICD-10-CM | POA: Insufficient documentation

## 2016-05-11 DIAGNOSIS — I129 Hypertensive chronic kidney disease with stage 1 through stage 4 chronic kidney disease, or unspecified chronic kidney disease: Secondary | ICD-10-CM | POA: Insufficient documentation

## 2016-05-11 DIAGNOSIS — D696 Thrombocytopenia, unspecified: Secondary | ICD-10-CM | POA: Insufficient documentation

## 2016-05-11 DIAGNOSIS — Z992 Dependence on renal dialysis: Secondary | ICD-10-CM | POA: Insufficient documentation

## 2016-05-11 LAB — COMPREHENSIVE METABOLIC PANEL
ALT: 11 U/L — ABNORMAL LOW (ref 14–54)
AST: 23 U/L (ref 15–41)
Albumin: 2.9 g/dL — ABNORMAL LOW (ref 3.5–5.0)
Alkaline Phosphatase: 203 U/L — ABNORMAL HIGH (ref 38–126)
Anion gap: 11 (ref 5–15)
BUN: 59 mg/dL — ABNORMAL HIGH (ref 6–20)
CHLORIDE: 95 mmol/L — AB (ref 101–111)
CO2: 29 mmol/L (ref 22–32)
Calcium: 8.6 mg/dL — ABNORMAL LOW (ref 8.9–10.3)
Creatinine, Ser: 3.6 mg/dL — ABNORMAL HIGH (ref 0.44–1.00)
GFR, EST AFRICAN AMERICAN: 16 mL/min — AB (ref >60)
GFR, EST NON AFRICAN AMERICAN: 14 mL/min — AB (ref >60)
Glucose, Bld: 153 mg/dL — ABNORMAL HIGH (ref 65–99)
POTASSIUM: 4.6 mmol/L (ref 3.5–5.1)
SODIUM: 135 mmol/L (ref 135–145)
Total Bilirubin: 1 mg/dL (ref 0.3–1.2)
Total Protein: 5.7 g/dL — ABNORMAL LOW (ref 6.5–8.1)

## 2016-05-11 LAB — CBC WITH DIFFERENTIAL/PLATELET
BASOS ABS: 0 10*3/uL (ref 0–0.1)
Basophils Relative: 1 %
EOS ABS: 0.1 10*3/uL (ref 0–0.7)
EOS PCT: 3 %
HCT: 25.4 % — ABNORMAL LOW (ref 35.0–47.0)
Hemoglobin: 8.6 g/dL — ABNORMAL LOW (ref 12.0–16.0)
Lymphocytes Relative: 10 %
Lymphs Abs: 0.4 10*3/uL — ABNORMAL LOW (ref 1.0–3.6)
MCH: 33.4 pg (ref 26.0–34.0)
MCHC: 33.9 g/dL (ref 32.0–36.0)
MCV: 98.4 fL (ref 80.0–100.0)
MONO ABS: 0.5 10*3/uL (ref 0.2–0.9)
Monocytes Relative: 15 %
Neutro Abs: 2.6 10*3/uL (ref 1.4–6.5)
Neutrophils Relative %: 71 %
PLATELETS: 108 10*3/uL — AB (ref 150–440)
RBC: 2.58 MIL/uL — ABNORMAL LOW (ref 3.80–5.20)
RDW: 21.4 % — AB (ref 11.5–14.5)
WBC: 3.6 10*3/uL (ref 3.6–11.0)

## 2016-05-11 MED ORDER — HEPARIN SOD (PORK) LOCK FLUSH 100 UNIT/ML IV SOLN
500.0000 [IU] | Freq: Once | INTRAVENOUS | Status: DC | PRN
  Filled 2016-05-11: qty 5

## 2016-05-11 MED ORDER — METHYLPREDNISOLONE SODIUM SUCC 125 MG IJ SOLR
125.0000 mg | Freq: Once | INTRAMUSCULAR | Status: AC
  Administered 2016-05-11: 125 mg via INTRAVENOUS
  Filled 2016-05-11: qty 2

## 2016-05-11 MED ORDER — PREDNISOLONE ACETATE 1 % OP SUSP
1.0000 [drp] | Freq: Four times a day (QID) | OPHTHALMIC | 2 refills | Status: DC
Start: 2016-05-11 — End: 2016-05-26

## 2016-05-11 MED ORDER — SODIUM CHLORIDE 0.9 % IV SOLN
Freq: Once | INTRAVENOUS | Status: AC
  Administered 2016-05-11: 10:00:00 via INTRAVENOUS
  Filled 2016-05-11: qty 1000

## 2016-05-11 MED ORDER — HEPARIN SOD (PORK) LOCK FLUSH 100 UNIT/ML IV SOLN
500.0000 [IU] | Freq: Once | INTRAVENOUS | Status: AC
  Administered 2016-05-11: 500 [IU] via INTRAVENOUS

## 2016-05-11 MED ORDER — SODIUM CHLORIDE 0.9% FLUSH
10.0000 mL | Freq: Once | INTRAVENOUS | Status: AC
  Administered 2016-05-11: 10 mL via INTRAVENOUS
  Filled 2016-05-11: qty 10

## 2016-05-11 MED ORDER — ACETAMINOPHEN 325 MG PO TABS
650.0000 mg | ORAL_TABLET | Freq: Once | ORAL | Status: AC
  Administered 2016-05-11: 650 mg via ORAL
  Filled 2016-05-11: qty 2

## 2016-05-11 MED ORDER — PROCHLORPERAZINE MALEATE 10 MG PO TABS
10.0000 mg | ORAL_TABLET | Freq: Once | ORAL | Status: AC
  Administered 2016-05-11: 10 mg via ORAL
  Filled 2016-05-11: qty 1

## 2016-05-11 MED ORDER — DIPHENHYDRAMINE HCL 25 MG PO CAPS
25.0000 mg | ORAL_CAPSULE | Freq: Once | ORAL | Status: AC
  Administered 2016-05-11: 25 mg via ORAL
  Filled 2016-05-11: qty 1

## 2016-05-11 MED ORDER — DARATUMUMAB CHEMO INJECTION 400 MG/20ML
16.0000 mg/kg | Freq: Once | INTRAVENOUS | Status: AC
  Administered 2016-05-11: 720 mg via INTRAVENOUS
  Filled 2016-05-11: qty 36

## 2016-05-11 MED ORDER — SODIUM CHLORIDE 0.9% FLUSH
10.0000 mL | INTRAVENOUS | Status: DC | PRN
  Filled 2016-05-11: qty 10

## 2016-05-12 LAB — PROTEIN ELECTROPHORESIS, SERUM
A/G Ratio: 1.2 (ref 0.7–1.7)
ALBUMIN ELP: 2.8 g/dL — AB (ref 2.9–4.4)
ALPHA-1-GLOBULIN: 0.4 g/dL (ref 0.0–0.4)
Alpha-2-Globulin: 0.8 g/dL (ref 0.4–1.0)
BETA GLOBULIN: 0.9 g/dL (ref 0.7–1.3)
GAMMA GLOBULIN: 0.4 g/dL (ref 0.4–1.8)
Globulin, Total: 2.4 g/dL (ref 2.2–3.9)
TOTAL PROTEIN ELP: 5.2 g/dL — AB (ref 6.0–8.5)

## 2016-05-12 LAB — KAPPA/LAMBDA LIGHT CHAINS
KAPPA FREE LGHT CHN: 9514 mg/L — AB (ref 3.3–19.4)
Kappa, lambda light chain ratio: 1463.69 — ABNORMAL HIGH (ref 0.26–1.65)
Lambda free light chains: 6.5 mg/L (ref 5.7–26.3)

## 2016-05-12 LAB — IGG, IGA, IGM
IGA: 16 mg/dL — AB (ref 87–352)
IGG (IMMUNOGLOBIN G), SERUM: 469 mg/dL — AB (ref 700–1600)
IGM, SERUM: 5 mg/dL — AB (ref 26–217)

## 2016-05-16 NOTE — Progress Notes (Signed)
[place]  [phone] [phone]) [phone]  ID: [name] OB: [birth year]  MR#: [id number]  MKL#:[id number]  [name], MD as PCP - General (Family Medicine)  CHIEF COMPLAINT: Multiple Myeloma in relapse with multiple bony lytic lesions, now with end-stage renal disease on dialysis.   INTERVAL HISTORY: Patient returns to clinic today for further evaluation and consideration of cycle 3 of single agent daratumumab.  Her performance status is declining. She continues to feel chronically weak and fatigued. Her pain is unchanged. Her mother reports hallucinations and "jerking" her legs, but no other neurologic complaints. She denies any fever or chills. She has a poor appetite, but her weight has remained stable.  She continues to have chronic back pain that would occasionally radiate down her leg.  She has dyspnea on exertion, but denies cough, hemoptysis, or chest pain. She complains of mild nausea and occasional vomiting. Patient offers no further specific complaints today.   REVIEW OF SYSTEMS:   Review of Systems  Constitutional: Positive for malaise/fatigue. Negative for chills, fever and weight loss.  HENT: Negative for congestion.   Respiratory: Positive for shortness of breath. Negative for cough.   Cardiovascular: Negative.  Negative for chest pain and leg swelling.  Gastrointestinal: Positive for nausea and vomiting. Negative for abdominal pain, blood in stool, constipation and diarrhea.  Genitourinary: Negative for hematuria.       On dialysis  Musculoskeletal: Positive for back pain.  Skin: Negative for itching and rash.  Neurological: Positive for tingling, sensory change and weakness. Negative for focal weakness and headaches.  Psychiatric/Behavioral: Positive for hallucinations. The patient is nervous/anxious and has insomnia.    As per HPI. Otherwise, a complete review of systems is negative.   PAST  MEDICAL HISTORY: Past Medical History:  Diagnosis Date  . Anemia   . Anxiety   . Chronic kidney disease    DIALYSIS  . Depression   . Diabetes mellitus without complication ([place])   . Dialysis patient ([place])    Mon, Wed, Fri  . History of chemotherapy    chemo on Tuesday and Wednesday  . Hypertension   . Multiple myeloma ([place])   . Neuropathy associated with cancer ([place])   . Osteoarthritis   . Pancreatitis   . Pneumonia    September 2017, [facility]  . Post-menopausal 2014  . Sepsis ([place])     PAST SURGICAL HISTORY: Past Surgical History:  Procedure Laterality Date  . AV FISTULA PLACEMENT Left 02/20/2016   Procedure: INSERTION OF ARTERIOVENOUS (AV) GORE-TEX GRAFT ARM;  Surgeon: Katha Cabal, MD;  Location: [facility] ORS;  Service: Vascular;  Laterality: Left;  . CESAREAN SECTION     x2  . dialysis catheter placement    . PERIPHERAL VASCULAR CATHETERIZATION N/A 11/19/2015   Procedure: Dialysis/Perma Catheter Insertion;  Surgeon: Algernon Huxley, MD;  Location: [place] CV LAB;  Service: Cardiovascular;  Laterality: N/A;  . PERIPHERAL VASCULAR CATHETERIZATION Left 03/10/2016   Procedure: Thrombectomy;  Surgeon: Katha Cabal, MD;  Location: [place] CV LAB;  Service: Cardiovascular;  Laterality: Left;  . PORTACATH PLACEMENT    . TUBAL LIGATION      FAMILY HISTORY: Reviewed and unchanged. No reported history of malignancy or chronic disease.     ADVANCED DIRECTIVES:    HEALTH MAINTENANCE: Social History  Substance Use Topics  . Smoking status: Never Smoker  . Smokeless tobacco: Never Used  . Alcohol use No     No Known Allergies  Current Outpatient Prescriptions  Medication Sig Dispense Refill  . acetaminophen (TYLENOL) 500 MG tablet Take 500 mg by mouth every 6 (six) hours as needed.    . citalopram (CELEXA) 20 MG tablet Take 1 tablet (20 mg total) by mouth daily. 30 tablet 5  . HYDROcodone-acetaminophen (NORCO) 5-325 MG tablet Take 1-2 tablets by mouth every 6  (six) hours as needed for moderate pain or severe pain. 50 tablet 0  . Insulin Detemir (LEVEMIR FLEXPEN) 100 UNIT/ML Pen Inject 5 Units into the skin daily at 10 pm.    . VERAPAMIL HCL ER PO Take 120 mg by mouth daily.    Marland Kitchen dronabinol (MARINOL) 2.5 MG capsule Take 1 capsule (2.5 mg total) by mouth 2 (two) times daily before a meal. (Patient not taking: Reported on 03/16/2016) 60 capsule 0  . ferrous sulfate 325 (65 FE) MG tablet Take 325 mg by mouth daily with breakfast.    . furosemide (LASIX) 80 MG tablet Take 1 tablet (80 mg total) by mouth daily. (Patient not taking: Reported on 03/16/2016) 30 tablet 0  . promethazine (PHENERGAN) 25 MG tablet Take 25 mg by mouth every 6 (six) hours as needed for nausea or vomiting.     No current facility-administered medications for this visit.    Facility-Administered Medications Ordered in Other Visits  Medication Dose Route Frequency Provider Last Rate Last Dose  . heparin lock flush 100 unit/mL  500 Units Intravenous Once Lloyd Huger, MD      . sodium chloride flush (NS) 0.9 % injection 10 mL  10 mL Intravenous Once Lloyd Huger, MD        Vitals:   05/18/16 0915  BP: 122/74  Pulse: 81  Resp: 18  Temp: (!) 96.9 F (36.1 C)   ECOG FS:2 - Symptomatic  General: No acute distress. Eyes: anicteric sclera. Lungs: Clear to auscultation bilaterally. Heart: Regular rate and rhythm. No rubs, murmurs, or gallops. Abdomen: Soft, nontender, nondistended. No organomegaly noted, normoactive bowel sounds. Musculoskeletal: No edema, cyanosis, or clubbing. Neuro: Alert, answering all questions appropriately. Cranial nerves grossly intact. Skin: No rashes or petechiae noted.  Psych: Normal affect.   LAB RESULTS:  CBC    Component Value Date/Time   WBC 4.6 05/18/2016 0840   RBC 2.54 (L) 05/18/2016 0840   HGB 8.4 (L) 05/18/2016 0840   HGB 11.7 (L) 06/27/2014 1344   HCT 24.9 (L) 05/18/2016 0840   HCT 34.0 (L) 06/27/2014 1344   PLT 118 (L)  05/18/2016 0840   PLT 267 06/27/2014 1344   MCV 97.8 05/18/2016 0840   MCV 88 06/27/2014 1344   MCH 32.9 05/18/2016 0840   MCHC 33.7 05/18/2016 0840   RDW 19.7 (H) 05/18/2016 0840   RDW 13.4 06/27/2014 1344   LYMPHSABS 0.3 (L) 05/18/2016 0840   LYMPHSABS 0.9 (L) 06/27/2014 1344   MONOABS 0.4 05/18/2016 0840   MONOABS 0.9 06/27/2014 1344   EOSABS 0.0 05/18/2016 0840   EOSABS 0.2 06/27/2014 1344   BASOSABS 0.0 05/18/2016 0840   BASOSABS 0.1 06/27/2014 1344   BMET    Component Value Date/Time   NA 136 05/18/2016 0840   NA 137 06/27/2014 1344   K 4.4 05/18/2016 0840   K 4.0 06/27/2014 1344   CL 97 (L) 05/18/2016 0840   CL 106 06/27/2014 1344   CO2 29 05/18/2016 0840   CO2 26 06/27/2014 1344   GLUCOSE 163 (H) 05/18/2016 0840   GLUCOSE 83 06/27/2014 1344   BUN 50 (H)  05/18/2016 0840   BUN 27 (H) 06/27/2014 1344   CREATININE 3.22 (H) 05/18/2016 0840   CREATININE 1.36 (H) 06/27/2014 1344   CALCIUM 6.9 (L) 05/18/2016 0840   CALCIUM 9.1 06/27/2014 1344   GFRNONAA 16 (L) 05/18/2016 0840   GFRNONAA 46 (L) 06/27/2014 1344   GFRAA 18 (L) 05/18/2016 0840   GFRAA 53 (L) 06/27/2014 1344   Lab Results  Component Value Date   TOTALPROTELP 5.2 (L) 05/11/2016   ALBUMINELP 2.8 (L) 05/11/2016   A1GS 0.4 05/11/2016   A2GS 0.8 05/11/2016   BETS 0.9 05/11/2016   GAMS 0.4 05/11/2016   MSPIKE Not Observed 05/11/2016   SPEI Comment 05/11/2016        STUDIES: No results found.  ASSESSMENT: Multiple Myeloma in relapse with multiple bony lytic lesions, end-stage renal disease.   PLAN:    1. Multiple Myeloma in relapse with multiple bony lytic lesions: PET scan results reviewed independently consistent with progression of disease. Patient's kappa free light chains have trended down significantly after her first infusion of single agent Daratumumab. Daratumumab does not need to be dose reduced in the setting of end-stage renal disease and dialysis. Hold Xgeva given her hypocalcemia.  Because of patient's immigration status, she could not undergo transplant. Proceed with cycle 3 today. Return to clinic in 1 week for consideration of cycle 3 and then in 2 weeks for further evaluation and consideration of cycle 4 of 8 of weekly daratumumab. After the patient receives 8 weekly cycles, we will switch to treatment every 2 weeks for an additional 8 cycles. At that point patient can be switched to maintenance monthly regimen. 2. Back pain: Continue current narcotics as prescribed.  3. End-stage renal disease: Likely secondary to progression of disease. Continue dialysis on Monday, Wednesdays and Fridays. Has fistula. 4. Anemia: Hemoglobin decreased. Patient does not require transfusion at this time.  5. Hyperglycemia: Patient's blood glucose has improved since initiating insulin. Continue monitoring and treatment by primary care. Monitor closely with solumedrol as a pre-medication. 6. Thrombocytopenia: Multifactorial secondary to treatment as well as disease progression. 7. Depression: Continue Celexa 20 mg daily. Patient was given a refill today. 8. Weight Loss: Stable without medication. Patient stopped taking Marinol due to making her "feel funny". She states that she has an improved appetite without the medication. Will continue to monitor. 9. Constipation: Resolved. Take OTC medications if needed. Monitor.  10. Pruritus: Resolved. Patient states Benadryl works, but only for several hours. Continue Atarax as needed. 11. Confusion/hallucinations: MRI brian is negative. 12. Hypocalcemia: Patient received 2 g IV calcium gluconate today.   Interpreter present during entire clinic visit.  Patient expressed understanding and was in agreement with this plan. She also understands that She can call clinic at any time with any questions, concerns, or complaints.    Lloyd Huger, MD 05/18/16 5:03 PM

## 2016-05-18 ENCOUNTER — Inpatient Hospital Stay: Payer: Self-pay

## 2016-05-18 ENCOUNTER — Inpatient Hospital Stay (HOSPITAL_BASED_OUTPATIENT_CLINIC_OR_DEPARTMENT_OTHER): Payer: Self-pay | Admitting: Oncology

## 2016-05-18 VITALS — BP 122/74 | HR 81 | Temp 96.9°F | Resp 18 | Wt 100.5 lb

## 2016-05-18 DIAGNOSIS — R410 Disorientation, unspecified: Secondary | ICD-10-CM

## 2016-05-18 DIAGNOSIS — M1990 Unspecified osteoarthritis, unspecified site: Secondary | ICD-10-CM

## 2016-05-18 DIAGNOSIS — R5383 Other fatigue: Secondary | ICD-10-CM

## 2016-05-18 DIAGNOSIS — M545 Low back pain: Secondary | ICD-10-CM

## 2016-05-18 DIAGNOSIS — G4700 Insomnia, unspecified: Secondary | ICD-10-CM

## 2016-05-18 DIAGNOSIS — C9002 Multiple myeloma in relapse: Secondary | ICD-10-CM

## 2016-05-18 DIAGNOSIS — D696 Thrombocytopenia, unspecified: Secondary | ICD-10-CM

## 2016-05-18 DIAGNOSIS — Z8719 Personal history of other diseases of the digestive system: Secondary | ICD-10-CM

## 2016-05-18 DIAGNOSIS — F419 Anxiety disorder, unspecified: Secondary | ICD-10-CM

## 2016-05-18 DIAGNOSIS — R531 Weakness: Secondary | ICD-10-CM

## 2016-05-18 DIAGNOSIS — Z8701 Personal history of pneumonia (recurrent): Secondary | ICD-10-CM

## 2016-05-18 DIAGNOSIS — F329 Major depressive disorder, single episode, unspecified: Secondary | ICD-10-CM

## 2016-05-18 DIAGNOSIS — E1122 Type 2 diabetes mellitus with diabetic chronic kidney disease: Secondary | ICD-10-CM

## 2016-05-18 DIAGNOSIS — R630 Anorexia: Secondary | ICD-10-CM

## 2016-05-18 DIAGNOSIS — N186 End stage renal disease: Secondary | ICD-10-CM

## 2016-05-18 DIAGNOSIS — D649 Anemia, unspecified: Secondary | ICD-10-CM

## 2016-05-18 DIAGNOSIS — R112 Nausea with vomiting, unspecified: Secondary | ICD-10-CM

## 2016-05-18 DIAGNOSIS — I129 Hypertensive chronic kidney disease with stage 1 through stage 4 chronic kidney disease, or unspecified chronic kidney disease: Secondary | ICD-10-CM

## 2016-05-18 DIAGNOSIS — Z992 Dependence on renal dialysis: Secondary | ICD-10-CM

## 2016-05-18 DIAGNOSIS — R0602 Shortness of breath: Secondary | ICD-10-CM

## 2016-05-18 LAB — COMPREHENSIVE METABOLIC PANEL
ALT: 13 U/L — AB (ref 14–54)
AST: 26 U/L (ref 15–41)
Albumin: 3 g/dL — ABNORMAL LOW (ref 3.5–5.0)
Alkaline Phosphatase: 313 U/L — ABNORMAL HIGH (ref 38–126)
Anion gap: 10 (ref 5–15)
BILIRUBIN TOTAL: 0.8 mg/dL (ref 0.3–1.2)
BUN: 50 mg/dL — AB (ref 6–20)
CHLORIDE: 97 mmol/L — AB (ref 101–111)
CO2: 29 mmol/L (ref 22–32)
CREATININE: 3.22 mg/dL — AB (ref 0.44–1.00)
Calcium: 6.9 mg/dL — ABNORMAL LOW (ref 8.9–10.3)
GFR calc Af Amer: 18 mL/min — ABNORMAL LOW (ref >60)
GFR, EST NON AFRICAN AMERICAN: 16 mL/min — AB (ref >60)
Glucose, Bld: 163 mg/dL — ABNORMAL HIGH (ref 65–99)
Potassium: 4.4 mmol/L (ref 3.5–5.1)
Sodium: 136 mmol/L (ref 135–145)
TOTAL PROTEIN: 5.7 g/dL — AB (ref 6.5–8.1)

## 2016-05-18 LAB — CBC WITH DIFFERENTIAL/PLATELET
BASOS ABS: 0 10*3/uL (ref 0–0.1)
Basophils Relative: 1 %
Eosinophils Absolute: 0 10*3/uL (ref 0–0.7)
Eosinophils Relative: 1 %
HEMATOCRIT: 24.9 % — AB (ref 35.0–47.0)
HEMOGLOBIN: 8.4 g/dL — AB (ref 12.0–16.0)
LYMPHS PCT: 8 %
Lymphs Abs: 0.3 10*3/uL — ABNORMAL LOW (ref 1.0–3.6)
MCH: 32.9 pg (ref 26.0–34.0)
MCHC: 33.7 g/dL (ref 32.0–36.0)
MCV: 97.8 fL (ref 80.0–100.0)
Monocytes Absolute: 0.4 10*3/uL (ref 0.2–0.9)
Monocytes Relative: 10 %
NEUTROS ABS: 3.7 10*3/uL (ref 1.4–6.5)
Neutrophils Relative %: 80 %
Platelets: 118 10*3/uL — ABNORMAL LOW (ref 150–440)
RBC: 2.54 MIL/uL — AB (ref 3.80–5.20)
RDW: 19.7 % — ABNORMAL HIGH (ref 11.5–14.5)
WBC: 4.6 10*3/uL (ref 3.6–11.0)

## 2016-05-18 LAB — MAGNESIUM: Magnesium: 1.7 mg/dL (ref 1.7–2.4)

## 2016-05-18 MED ORDER — OXYCODONE HCL 10 MG PO TABS: 10.0000 mg | ORAL_TABLET | ORAL | 0 refills | Status: DC | PRN

## 2016-05-18 MED ORDER — PROCHLORPERAZINE MALEATE 10 MG PO TABS
10.0000 mg | ORAL_TABLET | Freq: Once | ORAL | Status: AC
  Administered 2016-05-18: 10 mg via ORAL
  Filled 2016-05-18: qty 1

## 2016-05-18 MED ORDER — HEPARIN SOD (PORK) LOCK FLUSH 100 UNIT/ML IV SOLN
500.0000 [IU] | Freq: Once | INTRAVENOUS | Status: AC | PRN
  Administered 2016-05-18: 500 [IU]
  Filled 2016-05-18: qty 5

## 2016-05-18 MED ORDER — METHYLPREDNISOLONE SODIUM SUCC 125 MG IJ SOLR
125.0000 mg | Freq: Once | INTRAMUSCULAR | Status: AC
  Administered 2016-05-18: 125 mg via INTRAVENOUS
  Filled 2016-05-18: qty 2

## 2016-05-18 MED ORDER — DARATUMUMAB CHEMO INJECTION 400 MG/20ML
16.0000 mg/kg | Freq: Once | INTRAVENOUS | Status: AC
  Administered 2016-05-18: 720 mg via INTRAVENOUS
  Filled 2016-05-18: qty 36

## 2016-05-18 MED ORDER — ACETAMINOPHEN 325 MG PO TABS
650.0000 mg | ORAL_TABLET | Freq: Once | ORAL | Status: AC
  Administered 2016-05-18: 650 mg via ORAL
  Filled 2016-05-18: qty 2

## 2016-05-18 MED ORDER — SODIUM CHLORIDE 0.9% FLUSH
10.0000 mL | INTRAVENOUS | Status: DC | PRN
  Administered 2016-05-18: 10 mL
  Filled 2016-05-18: qty 10

## 2016-05-18 MED ORDER — SODIUM CHLORIDE 0.9 % IV SOLN
2.0000 g | Freq: Once | INTRAVENOUS | Status: AC
  Administered 2016-05-18: 2 g via INTRAVENOUS
  Filled 2016-05-18: qty 20

## 2016-05-18 MED ORDER — DIPHENHYDRAMINE HCL 25 MG PO CAPS
25.0000 mg | ORAL_CAPSULE | Freq: Once | ORAL | Status: AC
  Administered 2016-05-18: 25 mg via ORAL
  Filled 2016-05-18: qty 1

## 2016-05-18 MED ORDER — SODIUM CHLORIDE 0.9 % IV SOLN
Freq: Once | INTRAVENOUS | Status: AC
  Administered 2016-05-18: 10:00:00 via INTRAVENOUS
  Filled 2016-05-18: qty 1000

## 2016-05-18 NOTE — Progress Notes (Signed)
Complains of hallucinations at night.

## 2016-05-19 LAB — PROTEIN ELECTROPHORESIS, SERUM
A/G Ratio: 1.4 (ref 0.7–1.7)
ALBUMIN ELP: 3 g/dL (ref 2.9–4.4)
ALPHA-1-GLOBULIN: 0.3 g/dL (ref 0.0–0.4)
ALPHA-2-GLOBULIN: 0.7 g/dL (ref 0.4–1.0)
Beta Globulin: 0.7 g/dL (ref 0.7–1.3)
GLOBULIN, TOTAL: 2.1 g/dL — AB (ref 2.2–3.9)
Gamma Globulin: 0.4 g/dL (ref 0.4–1.8)
Total Protein ELP: 5.1 g/dL — ABNORMAL LOW (ref 6.0–8.5)

## 2016-05-19 LAB — KAPPA/LAMBDA LIGHT CHAINS
KAPPA, LAMDA LIGHT CHAIN RATIO: 104.78 — AB (ref 0.26–1.65)
Kappa free light chain: 764.9 mg/L — ABNORMAL HIGH (ref 3.3–19.4)
LAMDA FREE LIGHT CHAINS: 7.3 mg/L (ref 5.7–26.3)

## 2016-05-19 LAB — IGG, IGA, IGM
IGA: 12 mg/dL — AB (ref 87–352)
IGG (IMMUNOGLOBIN G), SERUM: 455 mg/dL — AB (ref 700–1600)

## 2016-05-25 ENCOUNTER — Inpatient Hospital Stay: Payer: Self-pay

## 2016-05-25 ENCOUNTER — Other Ambulatory Visit: Payer: Self-pay | Admitting: Oncology

## 2016-05-25 ENCOUNTER — Emergency Department: Payer: Self-pay

## 2016-05-25 ENCOUNTER — Encounter: Payer: Self-pay | Admitting: Emergency Medicine

## 2016-05-25 ENCOUNTER — Inpatient Hospital Stay
Admission: EM | Admit: 2016-05-25 | Discharge: 2016-05-26 | DRG: 189 | Disposition: A | Discharge status: Home / Self Care | Payer: Self-pay | Attending: Internal Medicine | Admitting: Internal Medicine

## 2016-05-25 ENCOUNTER — Other Ambulatory Visit: Payer: Self-pay

## 2016-05-25 DIAGNOSIS — C9000 Multiple myeloma not having achieved remission: Secondary | ICD-10-CM | POA: Diagnosis present

## 2016-05-25 DIAGNOSIS — J9690 Respiratory failure, unspecified, unspecified whether with hypoxia or hypercapnia: Secondary | ICD-10-CM | POA: Diagnosis present

## 2016-05-25 DIAGNOSIS — N2581 Secondary hyperparathyroidism of renal origin: Secondary | ICD-10-CM | POA: Diagnosis present

## 2016-05-25 DIAGNOSIS — J810 Acute pulmonary edema: Principal | ICD-10-CM | POA: Diagnosis present

## 2016-05-25 DIAGNOSIS — R0603 Acute respiratory distress: Secondary | ICD-10-CM

## 2016-05-25 DIAGNOSIS — C9002 Multiple myeloma in relapse: Secondary | ICD-10-CM

## 2016-05-25 DIAGNOSIS — R0600 Dyspnea, unspecified: Secondary | ICD-10-CM

## 2016-05-25 DIAGNOSIS — G63 Polyneuropathy in diseases classified elsewhere: Secondary | ICD-10-CM | POA: Diagnosis present

## 2016-05-25 DIAGNOSIS — E875 Hyperkalemia: Secondary | ICD-10-CM | POA: Diagnosis present

## 2016-05-25 DIAGNOSIS — I120 Hypertensive chronic kidney disease with stage 5 chronic kidney disease or end stage renal disease: Secondary | ICD-10-CM | POA: Diagnosis present

## 2016-05-25 DIAGNOSIS — Z8249 Family history of ischemic heart disease and other diseases of the circulatory system: Secondary | ICD-10-CM

## 2016-05-25 DIAGNOSIS — D631 Anemia in chronic kidney disease: Secondary | ICD-10-CM | POA: Diagnosis present

## 2016-05-25 DIAGNOSIS — N186 End stage renal disease: Secondary | ICD-10-CM | POA: Diagnosis present

## 2016-05-25 DIAGNOSIS — Z992 Dependence on renal dialysis: Secondary | ICD-10-CM

## 2016-05-25 DIAGNOSIS — E1122 Type 2 diabetes mellitus with diabetic chronic kidney disease: Secondary | ICD-10-CM | POA: Diagnosis present

## 2016-05-25 DIAGNOSIS — J9601 Acute respiratory failure with hypoxia: Secondary | ICD-10-CM | POA: Diagnosis present

## 2016-05-25 DIAGNOSIS — Z79899 Other long term (current) drug therapy: Secondary | ICD-10-CM

## 2016-05-25 LAB — CBC WITH DIFFERENTIAL/PLATELET
BASOS PCT: 1 %
Basophils Absolute: 0 10*3/uL (ref 0–0.1)
Basophils Absolute: 0 10*3/uL (ref 0–0.1)
Basophils Relative: 1 %
Eosinophils Absolute: 0 10*3/uL (ref 0–0.7)
Eosinophils Absolute: 0 10*3/uL (ref 0–0.7)
Eosinophils Relative: 0 %
Eosinophils Relative: 0 %
HEMATOCRIT: 24.8 % — AB (ref 35.0–47.0)
HEMATOCRIT: 29.2 % — AB (ref 35.0–47.0)
HEMOGLOBIN: 8.4 g/dL — AB (ref 12.0–16.0)
Hemoglobin: 9.8 g/dL — ABNORMAL LOW (ref 12.0–16.0)
LYMPHS ABS: 0.4 10*3/uL — AB (ref 1.0–3.6)
LYMPHS PCT: 10 %
Lymphocytes Relative: 10 %
Lymphs Abs: 0.5 10*3/uL — ABNORMAL LOW (ref 1.0–3.6)
MCH: 33.1 pg (ref 26.0–34.0)
MCH: 33.2 pg (ref 26.0–34.0)
MCHC: 33.9 g/dL (ref 32.0–36.0)
MCHC: 33.5 g/dL (ref 32.0–36.0)
MCV: 97.7 fL (ref 80.0–100.0)
MCV: 98.8 fL (ref 80.0–100.0)
MONO ABS: 0.8 10*3/uL (ref 0.2–0.9)
MONOS PCT: 14 %
Monocytes Absolute: 0.6 10*3/uL (ref 0.2–0.9)
Monocytes Relative: 15 %
NEUTROS ABS: 3.1 10*3/uL (ref 1.4–6.5)
NEUTROS ABS: 3.9 10*3/uL (ref 1.4–6.5)
NEUTROS PCT: 75 %
Neutrophils Relative %: 74 %
PLATELETS: 136 10*3/uL — AB (ref 150–440)
PLATELETS: 139 10*3/uL — AB (ref 150–440)
RBC: 2.54 MIL/uL — ABNORMAL LOW (ref 3.80–5.20)
RBC: 2.95 MIL/uL — AB (ref 3.80–5.20)
RDW: 19.6 % — AB (ref 11.5–14.5)
RDW: 20 % — ABNORMAL HIGH (ref 11.5–14.5)
WBC: 4.1 10*3/uL (ref 3.6–11.0)
WBC: 5.3 10*3/uL (ref 3.6–11.0)

## 2016-05-25 LAB — URINALYSIS, COMPLETE (UACMP) WITH MICROSCOPIC
BACTERIA UA: NONE SEEN
BILIRUBIN URINE: NEGATIVE
Glucose, UA: 150 mg/dL — AB
Hgb urine dipstick: NEGATIVE
Ketones, ur: NEGATIVE mg/dL
Leukocytes, UA: NEGATIVE
NITRITE: NEGATIVE
PH: 9 — AB (ref 5.0–8.0)
Protein, ur: 100 mg/dL — AB
RBC / HPF: NONE SEEN RBC/hpf (ref 0–5)
SPECIFIC GRAVITY, URINE: 1.009 (ref 1.005–1.030)
Squamous Epithelial / LPF: NONE SEEN

## 2016-05-25 LAB — COMPREHENSIVE METABOLIC PANEL
ALBUMIN: 3.3 g/dL — AB (ref 3.5–5.0)
ALT: 13 U/L — ABNORMAL LOW (ref 14–54)
ALT: 11 U/L — ABNORMAL LOW (ref 14–54)
ANION GAP: 14 (ref 5–15)
AST: 21 U/L (ref 15–41)
AST: 21 U/L (ref 15–41)
Albumin: 3.2 g/dL — ABNORMAL LOW (ref 3.5–5.0)
Alkaline Phosphatase: 570 U/L — ABNORMAL HIGH (ref 38–126)
Alkaline Phosphatase: 587 U/L — ABNORMAL HIGH (ref 38–126)
Anion gap: 10 (ref 5–15)
BILIRUBIN TOTAL: 0.8 mg/dL (ref 0.3–1.2)
BUN: 40 mg/dL — AB (ref 6–20)
BUN: 42 mg/dL — ABNORMAL HIGH (ref 6–20)
CALCIUM: 7.5 mg/dL — AB (ref 8.9–10.3)
CHLORIDE: 97 mmol/L — AB (ref 101–111)
CO2: 28 mmol/L (ref 22–32)
CO2: 24 mmol/L (ref 22–32)
Calcium: 6.8 mg/dL — ABNORMAL LOW (ref 8.9–10.3)
Chloride: 96 mmol/L — ABNORMAL LOW (ref 101–111)
Creatinine, Ser: 3.17 mg/dL — ABNORMAL HIGH (ref 0.44–1.00)
Creatinine, Ser: 3.48 mg/dL — ABNORMAL HIGH (ref 0.44–1.00)
GFR calc Af Amer: 18 mL/min — ABNORMAL LOW (ref >60)
GFR calc non Af Amer: 14 mL/min — ABNORMAL LOW (ref >60)
GFR, EST AFRICAN AMERICAN: 16 mL/min — AB (ref >60)
GFR, EST NON AFRICAN AMERICAN: 16 mL/min — AB (ref >60)
GLUCOSE: 185 mg/dL — AB (ref 65–99)
Glucose, Bld: 210 mg/dL — ABNORMAL HIGH (ref 65–99)
POTASSIUM: 6 mmol/L — AB (ref 3.5–5.1)
Potassium: 5.2 mmol/L — ABNORMAL HIGH (ref 3.5–5.1)
Sodium: 134 mmol/L — ABNORMAL LOW (ref 135–145)
Sodium: 135 mmol/L (ref 135–145)
TOTAL PROTEIN: 6.2 g/dL — AB (ref 6.5–8.1)
Total Bilirubin: 0.9 mg/dL (ref 0.3–1.2)
Total Protein: 6.3 g/dL — ABNORMAL LOW (ref 6.5–8.1)

## 2016-05-25 LAB — GLUCOSE, CAPILLARY: Glucose-Capillary: 159 mg/dL — ABNORMAL HIGH (ref 65–99)

## 2016-05-25 LAB — TROPONIN I: TROPONIN I: 0.03 ng/mL — AB (ref <0.03)

## 2016-05-25 LAB — BRAIN NATRIURETIC PEPTIDE: B Natriuretic Peptide: >4500 pg/mL — ABNORMAL HIGH (ref 0.0–100.0)

## 2016-05-25 LAB — MRSA PCR SCREENING: MRSA by PCR: NEGATIVE

## 2016-05-25 LAB — LIPASE, BLOOD: Lipase: 50 U/L (ref 11–51)

## 2016-05-25 MED ORDER — OXYCODONE HCL 5 MG PO TABS: 10.0000 mg | ORAL_TABLET | ORAL | Status: DC | PRN

## 2016-05-25 MED ORDER — DARATUMUMAB CHEMO INJECTION 400 MG/20ML
16.0000 mg/kg | Freq: Once | INTRAVENOUS | Status: DC
  Filled 2016-05-25: qty 36

## 2016-05-25 MED ORDER — METHYLPREDNISOLONE SODIUM SUCC 125 MG IJ SOLR: 125.0000 mg | Freq: Once | INTRAMUSCULAR | Status: DC

## 2016-05-25 MED ORDER — LORAZEPAM 2 MG/ML IJ SOLN
1.0000 mg | Freq: Once | INTRAMUSCULAR | Status: AC
  Administered 2016-05-25: 1 mg via INTRAVENOUS

## 2016-05-25 MED ORDER — SODIUM CHLORIDE 0.9% FLUSH: 3.0000 mL | INTRAVENOUS | Status: DC | PRN

## 2016-05-25 MED ORDER — ONDANSETRON HCL 4 MG/2ML IJ SOLN: 4.0000 mg | Freq: Four times a day (QID) | INTRAMUSCULAR | Status: DC | PRN

## 2016-05-25 MED ORDER — LORAZEPAM 2 MG/ML IJ SOLN
INTRAMUSCULAR | Status: AC
  Filled 2016-05-25: qty 1

## 2016-05-25 MED ORDER — SODIUM CHLORIDE 0.9 % IV SOLN
2.0000 g | Freq: Once | INTRAVENOUS | Status: AC
  Administered 2016-05-25: 2 g via INTRAVENOUS
  Filled 2016-05-25: qty 20

## 2016-05-25 MED ORDER — LORAZEPAM 2 MG/ML IJ SOLN: 1.0000 mg | Freq: Once | INTRAMUSCULAR | Status: AC

## 2016-05-25 MED ORDER — SODIUM CHLORIDE 0.9% FLUSH
3.0000 mL | Freq: Two times a day (BID) | INTRAVENOUS | Status: DC
  Administered 2016-05-25 – 2016-05-26 (×2): 3 mL via INTRAVENOUS

## 2016-05-25 MED ORDER — LORAZEPAM 2 MG/ML IJ SOLN
0.5000 mg | INTRAMUSCULAR | Status: DC | PRN
  Administered 2016-05-25 (×2): 0.5 mg via INTRAVENOUS
  Filled 2016-05-25: qty 1

## 2016-05-25 MED ORDER — INSULIN ASPART 100 UNIT/ML ~~LOC~~ SOLN
0.0000 [IU] | Freq: Three times a day (TID) | SUBCUTANEOUS | Status: DC
  Administered 2016-05-26: 1 [IU] via SUBCUTANEOUS
  Filled 2016-05-25: qty 1

## 2016-05-25 MED ORDER — DIPHENHYDRAMINE HCL 25 MG PO CAPS: 25.0000 mg | ORAL_CAPSULE | Freq: Once | ORAL | Status: DC

## 2016-05-25 MED ORDER — SODIUM CHLORIDE 0.9 % IV SOLN
250.0000 mL | INTRAVENOUS | Status: DC | PRN
Start: 2016-05-25 — End: 2016-05-26

## 2016-05-25 MED ORDER — SODIUM CHLORIDE 0.9 % IV SOLN
Freq: Once | INTRAVENOUS | Status: AC
  Administered 2016-05-25: 10:00:00 via INTRAVENOUS
  Filled 2016-05-25: qty 1000

## 2016-05-25 MED ORDER — PROCHLORPERAZINE MALEATE 10 MG PO TABS: 10.0000 mg | ORAL_TABLET | Freq: Once | ORAL | Status: DC

## 2016-05-25 MED ORDER — ACETAMINOPHEN 325 MG PO TABS: 650.0000 mg | ORAL_TABLET | ORAL | Status: DC | PRN

## 2016-05-25 MED ORDER — CITALOPRAM HYDROBROMIDE 20 MG PO TABS
20.0000 mg | ORAL_TABLET | Freq: Every day | ORAL | Status: DC
  Administered 2016-05-26: 20 mg via ORAL
  Filled 2016-05-25: qty 1

## 2016-05-25 MED ORDER — SODIUM CHLORIDE 0.9 % IV SOLN: 250.0000 mL | INTRAVENOUS | Status: DC | PRN

## 2016-05-25 MED ORDER — HEPARIN SODIUM (PORCINE) 5000 UNIT/ML IJ SOLN
5000.0000 [IU] | Freq: Three times a day (TID) | INTRAMUSCULAR | Status: DC
  Administered 2016-05-25 – 2016-05-26 (×3): 5000 [IU] via SUBCUTANEOUS
  Filled 2016-05-25 (×3): qty 1

## 2016-05-25 MED ORDER — FUROSEMIDE 10 MG/ML IJ SOLN
80.0000 mg | Freq: Once | INTRAMUSCULAR | Status: AC
  Administered 2016-05-25: 80 mg via INTRAVENOUS

## 2016-05-25 MED ORDER — LORAZEPAM 2 MG/ML IJ SOLN
INTRAMUSCULAR | Status: AC
  Administered 2016-05-25: 0.5 mg via INTRAVENOUS
  Filled 2016-05-25: qty 1

## 2016-05-25 MED ORDER — HEPARIN SOD (PORK) LOCK FLUSH 100 UNIT/ML IV SOLN
500.0000 [IU] | Freq: Once | INTRAVENOUS | Status: AC | PRN
  Administered 2016-05-25: 500 [IU]

## 2016-05-25 MED ORDER — FAMOTIDINE IN NACL 20-0.9 MG/50ML-% IV SOLN
20.0000 mg | Freq: Two times a day (BID) | INTRAVENOUS | Status: DC
  Administered 2016-05-25 – 2016-05-26 (×2): 20 mg via INTRAVENOUS
  Filled 2016-05-25 (×2): qty 50

## 2016-05-25 MED ORDER — ACETAMINOPHEN 325 MG PO TABS: 650.0000 mg | ORAL_TABLET | Freq: Once | ORAL | Status: DC

## 2016-05-25 MED ORDER — FUROSEMIDE 10 MG/ML IJ SOLN
INTRAMUSCULAR | Status: AC
  Administered 2016-05-25: 80 mg via INTRAVENOUS
  Filled 2016-05-25: qty 8

## 2016-05-25 NOTE — Progress Notes (Signed)
Pre hd assessment  

## 2016-05-25 NOTE — Progress Notes (Signed)
Spoke with Dr Grayland Ormond regarding pt's electrolytes, continue with treatment and he will have lab results faxed to kidney MD 1000 pt became extremely anxious, Sats 95% RA, BP 167/97 HR 112, pt states she feels like she is having difficulty breathing, pt has been out of BP medication since Friday, Jamie Keller states it will take 48 hours to have it filled, interpreter at chair side 1015 lorazepam 1mg  given IVP as ordered, O2 at 2L/[place] to decrease anxiety related to breathing sats 95% on RA, 100% on 2L/Jamie Keller, pt states she is breathing better, will monitor pt further until she is more stable for chemo 1045 pt having more R distress, audible wheezing, neck vein distention, MD called to come over to infusion, sats97% with O2 increased to 3/L 1100 Dr Grayland Ormond here to see pt, order for calcium 2gm to be given, no chemo today and will continue to monitor 1345 Spoke with pt via interpreter via phone, triage nurse phone number given to pt and written down for pt, pt offered a wheelchair to be discharged to home  Through the interpreter, pt refused

## 2016-05-25 NOTE — ED Provider Notes (Addendum)
[facility] Emergency Department Provider Note  ____________________________________________   I have reviewed the triage vital signs and the nursing notes.   HISTORY  Chief Complaint Weakness and Shortness of Breath    HPI Jamie Keller is a 51 y.o. female  presents today complaining of muscle different things. She is him and it was a Friday dialysis patient. She had full dialysis yesterday. She states a ventricular fracture however over the last few days she's been getting more and more short of breath. Patient has a history of pulmonary edema and feels that she is back in this. Patient also has had nausea vomiting and diarrhea with no abdominal pain for the last few days and today she nearly passed out in the cancer center. Patient does get chemotherapy her last chemotherapy was last Tuesday. She denies fever. Her biggest complaint is shortness of breath vomiting diarrhea and weakness generally speaking. Patient does have multiple myeloma. She is a very sick patient at baseline. History per interpreter  =   Past Medical History:  Diagnosis Date  . Anemia   . Anxiety   . Chronic kidney disease    DIALYSIS  . Depression   . Diabetes mellitus without complication ([place])   . Dialysis patient ([place])    Mon, Wed, Fri  . History of chemotherapy    chemo on Tuesday and Wednesday  . Hypertension   . Multiple myeloma ([place])   . Neuropathy associated with cancer ([place])   . Osteoarthritis   . Pancreatitis   . Pneumonia    September 2017, [facility]  . Post-menopausal 2014  . Sepsis [facility])     Patient Active Problem List   Diagnosis Date Noted  . Multiple myeloma in relapse ([place]) 05/01/2016  . Influenza B 04/02/2016  . Pulmonary edema 04/01/2016  . Hyperkalemia 04/01/2016  . Epigastric pain   . Atypical pneumonia 12/26/2015  . Immunocompromised ([place]) 12/26/2015  . Sepsis ([place]) 12/01/2015  . HCAP (healthcare-associated pneumonia) 12/01/2015  . End stage  renal failure on dialysis ([place]) 12/01/2015  . HTN (hypertension) 12/01/2015  . Depression 12/01/2015  . Acute respiratory failure ([place]) 11/14/2015  . Acute renal failure ([place])   . Multiple myeloma not having achieved remission ([place]) 09/06/2015  . Post-menopausal   . Bulging lumbar disc 05/28/2014    Past Surgical History:  Procedure Laterality Date  . AV FISTULA PLACEMENT Left 02/20/2016   Procedure: INSERTION OF ARTERIOVENOUS (AV) GORE-TEX GRAFT ARM;  Surgeon: Katha Cabal, MD;  Location: [facility] ORS;  Service: Vascular;  Laterality: Left;  . CESAREAN SECTION     x2  . dialysis catheter placement    . PERIPHERAL VASCULAR CATHETERIZATION N/A 11/19/2015   Procedure: Dialysis/Perma Catheter Insertion;  Surgeon: Algernon Huxley, MD;  Location: [place] CV LAB;  Service: Cardiovascular;  Laterality: N/A;  . PERIPHERAL VASCULAR CATHETERIZATION Left 03/10/2016   Procedure: Thrombectomy;  Surgeon: Katha Cabal, MD;  Location: [place] CV LAB;  Service: Cardiovascular;  Laterality: Left;  . PERIPHERAL VASCULAR CATHETERIZATION N/A 03/31/2016   Procedure: Dialysis/Perma Catheter Removal;  Surgeon: Katha Cabal, MD;  Location: [place] CV LAB;  Service: Cardiovascular;  Laterality: N/A;  . PORTACATH PLACEMENT    . TUBAL LIGATION      Prior to Admission medications   Medication Sig Start Date End Date Taking? Authorizing Provider  citalopram (CELEXA) 20 MG tablet Take 1 tablet (20 mg total) by mouth daily. 04/13/16  Yes Lloyd Huger, MD  Insulin Detemir (LEVEMIR FLEXPEN)  100 UNIT/ML Pen Inject 5 Units into the skin daily at 10 pm.   Yes Historical Provider, MD  Oxycodone HCl 10 MG TABS Take 1 tablet (10 mg total) by mouth every 4 (four) hours as needed (take 1 tablet by mouth every 4 - 6 hours as needed for severe pain). 05/18/16  Yes Lloyd Huger, MD  prochlorperazine (COMPAZINE) 10 MG tablet Take 10 mg by mouth every 6 (six) hours as needed for nausea or vomiting.    Yes Historical Provider, MD  verapamil (CALAN-SR) 120 MG CR tablet Take 120 mg by mouth daily.   Yes Historical Provider, MD  acyclovir (ZOVIRAX) 400 MG tablet Take 1 tablet (400 mg total) by mouth 2 (two) times daily. Patient not taking: Reported on 05/25/2016 05/01/16   Lloyd Huger, MD  benzonatate (TESSALON) 200 MG capsule Take 1 capsule (200 mg total) by mouth 3 (three) times daily as needed for cough. Patient not taking: Reported on 05/25/2016 03/30/16   Lloyd Huger, MD  hydrOXYzine (ATARAX/VISTARIL) 10 MG tablet Take 1 tablet (10 mg total) by mouth 3 (three) times daily as needed for itching. Patient not taking: Reported on 05/25/2016 03/16/16   Lloyd Huger, MD  Insulin Pen Needle (NOVOFINE) 30G X 8 MM MISC Inject 10 each into the skin as needed. 04/02/16   Srikar Sudini, MD  prednisoLONE acetate (PRED FORTE) 1 % ophthalmic suspension Place 1 drop into both eyes 4 (four) times daily. Patient not taking: Reported on 05/25/2016 05/11/16   Lloyd Huger, MD    Allergies Patient has no known allergies.  Family History  Problem Relation Age of Onset  . Hypercholesterolemia Mother   . Hypertension Mother   . Hypertension Father     Social History Social History  Substance Use Topics  . Smoking status: Never Smoker  . Smokeless tobacco: Never Used  . Alcohol use No    Review of Systems Constitutional: No fever/chills Eyes: No visual changes. ENT: No sore throat. No stiff neck no neck pain Cardiovascular: Denies chest pain. Respiratory: Positive shortness of breath. Gastrointestinal:   Positive vomiting.  Positive diarrhea.  No constipation. Genitourinary: Negative for dysuria. Musculoskeletal: Negative lower extremity swelling Skin: Negative for rash. Neurological: Negative for severe headaches, focal weakness or numbness. 10-point ROS otherwise negative.  ____________________________________________   PHYSICAL EXAM:  VITAL SIGNS: ED Triage Vitals  Enc  Vitals Group     BP 05/25/16 1450 (!) 181/73     Pulse Rate 05/25/16 1450 (!) 106     Resp 05/25/16 1450 18     Temp 05/25/16 1450 98.4 F (36.9 C)     Temp Source 05/25/16 1450 Oral     SpO2 05/25/16 1450 94 %     Weight 05/25/16 1451 100 lb (45.4 kg)     Height --      Head Circumference --      Peak Flow --      Pain Score 05/25/16 1451 0     Pain Loc --      Pain Edu? --      Excl. in [place]? --     Constitutional: Alert and oriented. Well appearing and in no acute distress. Eyes: Conjunctivae are normal. PERRL. EOMI. Head: Atraumatic. Nose: No congestion/rhinnorhea. Mouth/Throat: Mucous membranes are moist.  Oropharynx non-erythematous. Neck: No stridor.   Nontender with no meningismusPositive JVD Cardiovascular: Normal rate, regular rhythm. Grossly normal heart sounds.  Good peripheral circulation. Respiratory: Breathing rapidly very diminished in the bases  sounds wet Abdominal: Soft and nontender. No distention. No guarding no rebound Back:  There is no focal tenderness or step off.  there is no midline tenderness there are no lesions noted. there is no CVA tenderness Musculoskeletal: No lower extremity tenderness, no upper extremity tenderness. No joint effusions, no DVT signs strong distal pulses no edema Neurologic:  Normal speech and language. No gross focal neurologic deficits are appreciated.  Skin:  Skin is warm, dry and intact. No rash noted. Psychiatric: Mood and affect are normal. Speech and behavior are normal.  ____________________________________________   LABS (all labs ordered are listed, but only abnormal results are displayed)  Labs Reviewed  URINALYSIS, COMPLETE (UACMP) WITH MICROSCOPIC - Abnormal; Notable for the following:       Result Value   Color, Urine YELLOW (*)    APPearance CLEAR (*)    pH 9.0 (*)    Glucose, UA 150 (*)    Protein, ur 100 (*)    All other components within normal limits  COMPREHENSIVE METABOLIC PANEL - Abnormal; Notable for  the following:    Potassium 6.0 (*)    Chloride 97 (*)    Glucose, Bld 185 (*)    BUN 42 (*)    Creatinine, Ser 3.48 (*)    Calcium 7.5 (*)    Total Protein 6.3 (*)    Albumin 3.3 (*)    ALT 11 (*)    Alkaline Phosphatase 587 (*)    GFR calc non Af Amer 14 (*)    GFR calc Af Amer 16 (*)    All other components within normal limits  TROPONIN I - Abnormal; Notable for the following:    Troponin I 0.03 (*)    All other components within normal limits  CBC WITH DIFFERENTIAL/PLATELET - Abnormal; Notable for the following:    RBC 2.95 (*)    Hemoglobin 9.8 (*)    HCT 29.2 (*)    RDW 20.0 (*)    Platelets 139 (*)    Lymphs Abs 0.5 (*)    All other components within normal limits  CULTURE, BLOOD (ROUTINE X 2)  CULTURE, BLOOD (ROUTINE X 2)  URINE CULTURE  LIPASE, BLOOD  BRAIN NATRIURETIC PEPTIDE   ____________________________________________  EKG  I personally interpreted any EKGs ordered by me or triage Sinus rate 108, no acute ST elevation or depression nonspecific ST changes no peaked T waves ____________________________________________  RADIOLOGY  I reviewed any imaging ordered by me or triage that were performed during my shift and, if possible, patient and/or family made aware of any abnormal findings. ____________________________________________   PROCEDURES  Procedure(s) performed: None  Procedures  Critical Care performed: CRITICAL CARE Performed by: Schuyler Amor   Total critical care time: 55 minutes  Critical care time was exclusive of separately billable procedures and treating other patients.  Critical care was necessary to treat or prevent imminent or life-threatening deterioration.  Critical care was time spent personally by me on the following activities: development of treatment plan with patient and/or surrogate as well as nursing, discussions with consultants, evaluation of patient's response to treatment, examination of patient, obtaining  history from patient or surrogate, ordering and performing treatments and interventions, ordering and review of laboratory studies, ordering and review of radiographic studies, pulse oximetry and re-evaluation of patient's condition.   ____________________________________________   INITIAL IMPRESSION / ASSESSMENT AND PLAN / ED COURSE  Pertinent labs & imaging results that were available during my care of the patient were reviewed by  me and considered in my medical decision making (see chart for details).  Patient with volume overload elevated potassium. We have given her Lasix, I talked to Dr. Griselda Miner nephrology who knows her well. He did not wish me to give any other treatment for her hyperkalemia given normal EKG and the fact that he is going to dialyze her very soon. Dr. Dr. Gilberto Better who admitted to the ICU. He did start the patient on BiPAP because of her respiratory rate and work of breathing. abd benign/ ----------------------------------------- 6:04 PM on 05/25/2016 -----------------------------------------  pt resting much more comfortable on bipap.    ____________________________________________   FINAL CLINICAL IMPRESSION(S) / ED DIAGNOSES  Final diagnoses:  Hyperkalemia  Respiratory distress  Acute pulmonary edema ([place])      This chart was dictated using voice recognition software.  Despite best efforts to proofread,  errors can occur which can change meaning.      Schuyler Amor, MD 05/25/16 [place], MD 05/25/16 [id number]

## 2016-05-25 NOTE — ED Triage Notes (Addendum)
Pt to ed from cancer center after falling,  Pt brought to ed for evaluation.  Pt reports feeling sob, and weak at this time and diarrhea since Friday.  Pt pale, labored respirations, appears sick. Pt also reports she takes dialysis 3 times per week, on M/W/F, did have dialysis yesterday.  Charge rn notified of pt. No bed assignment at this time.

## 2016-05-25 NOTE — ED Notes (Signed)
Pt reporting increased anxiety with BiPAP. Intensivist paged and reported to RN to take pt off BiPAP and reassess without BiPAP while on RA. Pt in NAD at this time and able to maintain SpO2 at 97% at this time.

## 2016-05-25 NOTE — Progress Notes (Signed)
[facility]  ROUNDING NOTE   Subjective:   [name] admitted to [facility] ICU on 05/25/2016 for Hyperkalemia [E87.5] Acute pulmonary edema ([place]) [J81.0] Respiratory distress [R06.00]  Last dialysis was yesterday. Patient started to get short of breath starting Friday. Patient went for chemotherapy this morning. She was anxious and hypoxia. Sent to the ED where she was placed on Bipap.   She is now admitted to ICU with pulmonary edema. Hemodialysis emergent for tonight.   Objective:  Vital signs in last 24 hours:  Temp:  [98.4 F (36.9 C)] 98.4 F (36.9 C) (03/20 1450) Pulse Rate:  [106-125] 109 (03/20 1900) Resp:  [18-34] 33 (03/20 1730) BP: (165-181)/(73-97) 167/97 (03/20 1900) SpO2:  [91 %-100 %] 99 % (03/20 1900) Weight:  [45.4 kg (100 lb)] 45.4 kg (100 lb) (03/20 1451)  Weight change:  Filed Weights   05/25/16 1451  Weight: 45.4 kg (100 lb)    Intake/Output: No intake/output data recorded.   Intake/Output this shift:  No intake/output data recorded.  Physical Exam: General: Critically Ill  Head: BiPap  Eyes: Anicteric, PERRL  Neck: Supple, trachea midline  Lungs:  Bilateral crackles  Heart: tachycardia  Abdomen:  Soft, nontender,   Extremities: no peripheral edema.  Neurologic: Nonfocal, moving all four extremities  Skin: No lesions  Access: Left arm AVG    Basic Metabolic Panel:  Recent Labs Lab 05/25/16 0851 05/25/16 1531  NA 134* 135  K 5.2* 6.0*  CL 96* 97*  CO2 28 24  GLUCOSE 210* 185*  BUN 40* 42*  CREATININE 3.17* 3.48*  CALCIUM 6.8* 7.5*    Liver Function Tests:  Recent Labs Lab 05/25/16 0851 05/25/16 1531  AST 21 21  ALT 13* 11*  ALKPHOS 570* 587*  BILITOT 0.8 0.9  PROT 6.2* 6.3*  ALBUMIN 3.2* 3.3*    Recent Labs Lab 05/25/16 1531  LIPASE 50   No results for input(s): AMMONIA in the last 168 hours.  CBC:  Recent Labs Lab 05/25/16 0851 05/25/16 1531  WBC 4.1 5.3  NEUTROABS 3.1 3.9  HGB  8.4* 9.8*  HCT 24.8* 29.2*  MCV 97.7 98.8  PLT 136* 139*    Cardiac Enzymes:  Recent Labs Lab 05/25/16 1531  TROPONINI 0.03*    BNP: Invalid input(s): POCBNP  CBG:  Recent Labs Lab 05/25/16 2008  GLUCAP 159*    Microbiology: Results for orders placed or performed during the hospital encounter of 04/01/16  Blood culture (routine x 2)     Status: None   Collection Time: 04/01/16  9:12 AM  Result Value Ref Range Status   Specimen Description BLOOD RIGHT PORTA CATH  Final   Special Requests   Final    BOTTLES DRAWN AEROBIC AND ANAEROBIC AER 9 ML ANA 7 ML   Culture NO GROWTH 5 DAYS  Final   Report Status 04/06/2016 FINAL  Final  Blood culture (routine x 2)     Status: None   Collection Time: 04/01/16  9:12 AM  Result Value Ref Range Status   Specimen Description BLOOD RIGHT ARM  Final   Special Requests   Final    BOTTLES DRAWN AEROBIC AND ANAEROBIC AER 8 ML ANA8 ML   Culture NO GROWTH 5 DAYS  Final   Report Status 04/06/2016 FINAL  Final  MRSA PCR Screening     Status: None   Collection Time: 04/01/16  7:47 PM  Result Value Ref Range Status   MRSA by PCR NEGATIVE NEGATIVE Final  Comment:        The GeneXpert MRSA Assay (FDA approved for NASAL specimens only), is one component of a comprehensive MRSA colonization surveillance program. It is not intended to diagnose MRSA infection nor to guide or monitor treatment for MRSA infections.     Coagulation Studies: No results for input(s): LABPROT, INR in the last 72 hours.  Urinalysis:  Recent Labs  05/25/16 1531  COLORURINE YELLOW*  LABSPEC 1.009  PHURINE 9.0*  GLUCOSEU 150*  HGBUR NEGATIVE  BILIRUBINUR NEGATIVE  KETONESUR NEGATIVE  PROTEINUR 100*  NITRITE NEGATIVE  LEUKOCYTESUR NEGATIVE      Imaging: Dg Chest 2 View  Result Date: 05/25/2016 CLINICAL DATA:  Shortness of breath. EXAM: CHEST  2 VIEW COMPARISON:  Radiographs of April 01, 2016. FINDINGS: Stable cardiomediastinal silhouette.  Right internal jugular Port-A-Cath is unchanged in position. No pneumothorax is noted. Bilateral pulmonary edema and central pulmonary vascular congestion is noted. Moderate bilateral pleural effusions are noted which are increased compared to prior exam. Bony thorax is unremarkable. IMPRESSION: Findings consistent with bilateral pulmonary edema and pleural effusions which have increased compared to prior exam. Electronically Signed   By: Marijo Conception, M.D.   On: 05/25/2016 16:23     Medications:    . famotidine (PEPCID) IV  20 mg Intravenous Q12H  . heparin  5,000 Units Subcutaneous Q8H  . sodium chloride flush  3 mL Intravenous Q12H   sodium chloride, sodium chloride, acetaminophen, LORazepam, ondansetron (ZOFRAN) IV, sodium chloride flush  Assessment/ Plan:  Jamie Keller is a 51 y.o. Hispanic female Spanish speaking only with diabetes mellitus type II, hypertension, multiple myeloma, ESRD on hemodialysis.   [place]    1. End Stage Renal Disease:  2. Acute respiratory failure: noninvasive ventilation 3. Anemia of chronic kidney disease 4. Secondary Hyperparathyroidism: outpatient PTH, calcium and phosphorus at goal.   Plan Emergent hemodialysis tonight. Patient with flash pulmonary edema. Concerning since her EDW is 45kg. And she is currently 45.4 kg. Discussed case with daughter.    LOS: 0 Chesnie Capell 3/20/20188:30 PM

## 2016-05-25 NOTE — Progress Notes (Signed)
  Oncology Nurse Navigator Documentation [place] emergency light near conference room was activated. Upon arrival Jamie Keller was sitting on the commode with stool on floor. Reported that she fell and was assisted up to commode. She was pale with slightly labored respirations. Assisted to wheelchair. Interpreter was called. VS taken. When interpreter Helyn App arrived she denied hitting her head or feeling that she hurt anything. She said her knee gave out and she went down. It was reported that she had left infusion area where she received Calcium infusion. Called Dr. Grayland Ormond and notified of incidence. She was taken to the ED in a wheelchair as he advised. Report given to triage nurse. Navigator Location: [facility] (05/25/16 1500)   )Navigator Encounter Type: Lobby (05/25/16 1500)                                                    Time Spent with Patient: 30 (05/25/16 1500)

## 2016-05-25 NOTE — Progress Notes (Signed)
Start of hd 

## 2016-05-25 NOTE — H&P (Signed)
PULMONARY / CRITICAL CARE MEDICINE   Name: Jamie Keller MRN: [id number] DOB: 09/16/1965    ADMISSION DATE:  05/25/2016 CONSULTATION DATE:  05/25/16  REFERRING MD:  Dr. Devra Dopp   CHIEF COMPLAINT: Shortness of Breath  HISTORY OF PRESENT ILLNESS:   Jerzey Komperda is a 51 yo female with PMH significant for Anemia, Anxiety, Chronic Kidney Disease, multiple myeloma, Hypertension, neuropathy associated with cancer.  Patient usually gets dialyzed on M-W-F and was dialyzed on Monday(3/19) and removed 1.8 liters of fluid and she was down to her dry weight.Patient does get chemotherapy her last chemotherapy was last Tuesday(3/13)  Patient presented to [facility] on 3/20 with nausea, vomiting and diarrhea for the past few days. Patient is extremely short of breath.  CXR is concerning for pulmonary edema and pleural effusions. K is 6.0.  Nephrology team were consulted for emergent dialysis.  PAST MEDICAL HISTORY :  She  has a past medical history of Anemia; Anxiety; Chronic kidney disease; Depression; Diabetes mellitus without complication ([place]); Dialysis patient [facility]); History of chemotherapy; Hypertension; Multiple myeloma ([place]); Neuropathy associated with cancer ([place]); Osteoarthritis; Pancreatitis; Pneumonia; Post-menopausal (2014); and Sepsis ([place]).  PAST SURGICAL HISTORY: She  has a past surgical history that includes Cesarean section; Cardiac catheterization (N/A, 11/19/2015); Portacath placement; dialysis catheter placement; Tubal ligation; AV fistula placement (Left, 02/20/2016); Cardiac catheterization (Left, 03/10/2016); and Cardiac catheterization (N/A, 03/31/2016).  No Known Allergies  Current Facility-Administered Medications on File Prior to Encounter  Medication  . LORazepam (ATIVAN) injection 1 mg   Current Outpatient Prescriptions on File Prior to Encounter  Medication Sig  . citalopram (CELEXA) 20 MG tablet Take 1 tablet (20 mg total) by mouth daily.  . Insulin Detemir  (LEVEMIR FLEXPEN) 100 UNIT/ML Pen Inject 5 Units into the skin daily at 10 pm.  . Oxycodone HCl 10 MG TABS Take 1 tablet (10 mg total) by mouth every 4 (four) hours as needed (take 1 tablet by mouth every 4 - 6 hours as needed for severe pain).  . prochlorperazine (COMPAZINE) 10 MG tablet Take 10 mg by mouth every 6 (six) hours as needed for nausea or vomiting.  . verapamil (CALAN-SR) 120 MG CR tablet Take 120 mg by mouth daily.  Marland Kitchen acyclovir (ZOVIRAX) 400 MG tablet Take 1 tablet (400 mg total) by mouth 2 (two) times daily. (Patient not taking: Reported on 05/25/2016)  . benzonatate (TESSALON) 200 MG capsule Take 1 capsule (200 mg total) by mouth 3 (three) times daily as needed for cough. (Patient not taking: Reported on 05/25/2016)  . hydrOXYzine (ATARAX/VISTARIL) 10 MG tablet Take 1 tablet (10 mg total) by mouth 3 (three) times daily as needed for itching. (Patient not taking: Reported on 05/25/2016)  . Insulin Pen Needle (NOVOFINE) 30G X 8 MM MISC Inject 10 each into the skin as needed.  . prednisoLONE acetate (PRED FORTE) 1 % ophthalmic suspension Place 1 drop into both eyes 4 (four) times daily. (Patient not taking: Reported on 05/25/2016)    FAMILY HISTORY:  Her indicated that her mother is alive. She indicated that her father is alive.    SOCIAL HISTORY: She  reports that she has never smoked. She has never used smokeless tobacco. She reports that she does not drink alcohol or use drugs.  REVIEW OF SYSTEMS:   Unable to obtain as the patient is on BiPAP  SUBJECTIVE:  Unable to obtain as the patient is on BiPAP  VITAL SIGNS: BP (!) 167/97   Pulse (!) 109   Temp  98.4 F (36.9 C) (Oral)   Resp (!) 33   Wt 45.4 kg (100 lb)   LMP 02/25/1995 Comment: Tubal ligation 1996  SpO2 99%   BMI 19.53 kg/m   HEMODYNAMICS:    VENTILATOR SETTINGS:    INTAKE / OUTPUT: No intake/output data recorded.  PHYSICAL EXAMINATION: General:  Sickly appearing, Hispanic female in moderate  distress Neuro: Awake, alert, oriented, follows command HEENT:  AT,[place], jvd present Cardiovascular:  Tachycardia, S1S2, No m/r/g Lungs:  Crackles throughout and very diminished air entery Abdomen: soft, non tender, with  Positive bowel sounds. Musculoskeletal:  No edema, cyanosis, no inflammation/deformity noted Skin:  Warm, dry and intact  LABS:  BMET  Recent Labs Lab 05/25/16 0851 05/25/16 1531  NA 134* 135  K 5.2* 6.0*  CL 96* 97*  CO2 28 24  BUN 40* 42*  CREATININE 3.17* 3.48*  GLUCOSE 210* 185*    Electrolytes  Recent Labs Lab 05/25/16 0851 05/25/16 1531  CALCIUM 6.8* 7.5*    CBC  Recent Labs Lab 05/25/16 0851 05/25/16 1531  WBC 4.1 5.3  HGB 8.4* 9.8*  HCT 24.8* 29.2*  PLT 136* 139*    Coag's No results for input(s): APTT, INR in the last 168 hours.  Sepsis Markers No results for input(s): LATICACIDVEN, PROCALCITON, O2SATVEN in the last 168 hours.  ABG No results for input(s): PHART, PCO2ART, PO2ART in the last 168 hours.  Liver Enzymes  Recent Labs Lab 05/25/16 0851 05/25/16 1531  AST 21 21  ALT 13* 11*  ALKPHOS 570* 587*  BILITOT 0.8 0.9  ALBUMIN 3.2* 3.3*    Cardiac Enzymes  Recent Labs Lab 05/25/16 1531  TROPONINI 0.03*    Glucose  Recent Labs Lab 05/25/16 2008  GLUCAP 159*    Imaging Dg Chest 2 View  Result Date: 05/25/2016 CLINICAL DATA:  Shortness of breath. EXAM: CHEST  2 VIEW COMPARISON:  Radiographs of April 01, 2016. FINDINGS: Stable cardiomediastinal silhouette. Right internal jugular Port-A-Cath is unchanged in position. No pneumothorax is noted. Bilateral pulmonary edema and central pulmonary vascular congestion is noted. Moderate bilateral pleural effusions are noted which are increased compared to prior exam. Bony thorax is unremarkable. IMPRESSION: Findings consistent with bilateral pulmonary edema and pleural effusions which have increased compared to prior exam. Electronically Signed   By: Marijo Conception, M.D.   On: 05/25/2016 16:23     STUDIES:  none  CULTURES:  3/20 Urine culture>>  3/20 Blood Culture>>  ANTIBIOTICS: none SIGNIFICANT EVENTS: 3/20 Patient admitted to [facility] ICU with pulmonary edema ,CHF exacerbation and elevated  Potassium requiring emergent dialysis.  LINES/TUBES: none ASSESSMENT / PLAN:  PULMONARY A: Acute on Chronic respiratory failure secondary to ESRD Pulmonary edema Pleural effussions P:   Continue Bipap, wean as tolerated Emergent Dialsis Lasix 80 mg X1  CARDIOVASCULAR A: Pulmonary edema.  Hypertension P:  Continuous Telemetry Trend BNP Will obtain an ECHO  RENAL A:   ESRD Hyperkalemia Hypoalbuminemia P:   Replace electrolytes per protocol Nephrology consulted for emergent dialysis Follow BMET  GASTROINTESTINAL A:   Nausea/Vomiting/diarrhea P:   Zofran  HEMATOLOGIC A:   Anemia of Chronic disease P:  Transfuse per usual guidelines Heparin for DVT prophylaxis  INFECTIOUS A:   No active issues P:   Monitor Fever, CBC  ENDOCRINE A:   Diabetes Melitus P:   Blood glucose checks with meals and at bedtime  NEUROLOGIC A:   Multiple Myeloma Neuropathy associated with Cancer P:   Continue Celexa Continue Citalopram  Bincy Varughese,AG-ACNP Pulmonary and [place]   05/25/2016, 8:27 PM  51 yo female with PMH significant for Anemia, Anxiety, Chronic Kidney Disease, multiple myeloma, Hypertension, neuropathy associated with cancer.Now with acute hypoxic respiratory failure on bipap.  On review of CXR images, pt has bilateral pulmonary edema and mod bilat pleural effusions, BNP is elevated. On auscultation she has bilateral crackles likely secondary to pulm edema. Will wean off bipap today and d/w nephrology re: HD for further fluid removal.   Marda Stalker, M.D. 05/26/2016

## 2016-05-25 NOTE — Progress Notes (Signed)
Pre hd info 

## 2016-05-25 NOTE — ED Notes (Signed)
Dialysis informed pt will be transported to ICU. Pt placed back on Bipap. MD called in regards to ABG. RN in ICU made aware of changes in care since report was called.

## 2016-05-26 ENCOUNTER — Inpatient Hospital Stay: Payer: Self-pay

## 2016-05-26 DIAGNOSIS — E875 Hyperkalemia: Secondary | ICD-10-CM

## 2016-05-26 LAB — BASIC METABOLIC PANEL
ANION GAP: 8 (ref 5–15)
BUN: 19 mg/dL (ref 6–20)
CO2: 31 mmol/L (ref 22–32)
Calcium: 8.1 mg/dL — ABNORMAL LOW (ref 8.9–10.3)
Chloride: 100 mmol/L — ABNORMAL LOW (ref 101–111)
Creatinine, Ser: 2.14 mg/dL — ABNORMAL HIGH (ref 0.44–1.00)
GFR calc Af Amer: 30 mL/min — ABNORMAL LOW (ref >60)
GFR, EST NON AFRICAN AMERICAN: 26 mL/min — AB (ref >60)
GLUCOSE: 116 mg/dL — AB (ref 65–99)
Potassium: 4.1 mmol/L (ref 3.5–5.1)
Sodium: 139 mmol/L (ref 135–145)

## 2016-05-26 LAB — URINE CULTURE

## 2016-05-26 LAB — CBC
HCT: 24.1 % — ABNORMAL LOW (ref 35.0–47.0)
HEMOGLOBIN: 8 g/dL — AB (ref 12.0–16.0)
MCH: 33 pg (ref 26.0–34.0)
MCHC: 33.4 g/dL (ref 32.0–36.0)
MCV: 98.8 fL (ref 80.0–100.0)
PLATELETS: 117 10*3/uL — AB (ref 150–440)
RBC: 2.44 MIL/uL — AB (ref 3.80–5.20)
RDW: 19.3 % — ABNORMAL HIGH (ref 11.5–14.5)
WBC: 3.4 10*3/uL — AB (ref 3.6–11.0)

## 2016-05-26 LAB — GLUCOSE, CAPILLARY
GLUCOSE-CAPILLARY: 104 mg/dL — AB (ref 65–99)
Glucose-Capillary: 110 mg/dL — ABNORMAL HIGH (ref 65–99)
Glucose-Capillary: 143 mg/dL — ABNORMAL HIGH (ref 65–99)

## 2016-05-26 MED ORDER — NEPRO/CARBSTEADY PO LIQD
237.0000 mL | Freq: Two times a day (BID) | ORAL | Status: DC
  Administered 2016-05-26: 237 mL via ORAL

## 2016-05-26 NOTE — Discharge Summary (Signed)
Physician Discharge Summary  Patient ID: Jamie Keller MRN: [id number] DOB/AGE: 1966/02/24 51 y.o.  Admit date: 05/25/2016 Discharge date: 05/26/2016  Admission Diagnoses: Acute respiratory failure due to pulmonary edema.   Discharge Diagnoses:  Active Problems:   Respiratory failure (HCC)   Respiratory distress   Discharged Condition: fair  Hospital Course: Pt was admitted with respiratory distress. She was found to be in pulmonary edema. She was initially placed on bipap, with improvement. She then underwent HD with improvement in her respiratory status, she was taken off of bipap, her oxygen sat was normal (98%) on room air. She had no respiratory difficulties and was speaking in full sentences.   Consults: nephrology    Treatments:  Hemodialysis.   Discharge Exam: Blood pressure (!) 161/85, pulse (!) 110, temperature 98.7 F (37.1 C), temperature source Oral, resp. rate (!) 29, weight 90 lb 2.7 oz (40.9 kg), last menstrual period 02/25/1995, SpO2 97 %. General appearance: alert  Disposition: 01-Home or Self Care   Allergies as of 05/26/2016   No Known Allergies     Medication List    STOP taking these medications   acyclovir 400 MG tablet Commonly known as:  ZOVIRAX   benzonatate 200 MG capsule Commonly known as:  TESSALON   hydrOXYzine 10 MG tablet Commonly known as:  ATARAX/VISTARIL   prednisoLONE acetate 1 % ophthalmic suspension Commonly known as:  PRED FORTE     TAKE these medications   citalopram 20 MG tablet Commonly known as:  CELEXA Take 1 tablet (20 mg total) by mouth daily.   Insulin Pen Needle 30G X 8 MM Misc Commonly known as:  NOVOFINE Inject 10 each into the skin as needed.   LEVEMIR FLEXPEN 100 UNIT/ML Pen Generic drug:  Insulin Detemir Inject 5 Units into the skin daily at 10 pm.   Oxycodone HCl 10 MG Tabs Take 1 tablet (10 mg total) by mouth every 4 (four) hours as needed (take 1 tablet by mouth every 4 - 6 hours as needed  for severe pain).   prochlorperazine 10 MG tablet Commonly known as:  COMPAZINE Take 10 mg by mouth every 6 (six) hours as needed for nausea or vomiting.   verapamil 120 MG CR tablet Commonly known as:  CALAN-SR Take 120 mg by mouth daily.        Signed: Laverle Hobby 05/26/2016, 2:35 PM

## 2016-05-26 NOTE — Progress Notes (Signed)
[facility]  ROUNDING NOTE   Subjective:   Seen and examined on hemodialysis. Tolerating treatment well. UF goal of 2 litres.   Yesterday with emergent hemodialysis. UF of 3 litres.   History taken with assistance of Spanish Interpreter    HEMODIALYSIS FLOWSHEET:  Blood Flow Rate (mL/min): 400 mL/min Arterial Pressure (mmHg): -190 mmHg Venous Pressure (mmHg): 160 mmHg Transmembrane Pressure (mmHg): 70 mmHg Ultrafiltration Rate (mL/min): 900 mL/min Dialysate Flow Rate (mL/min): 600 ml/min Conductivity: Machine : 14 Conductivity: Machine : 14 Dialysis Fluid Bolus: Normal Saline Bolus Amount (mL): 250 mL Dialysate Change: 2K Intra-Hemodialysis Comments: resting with eyes closed     Objective:  Vital signs in last 24 hours:  Temp:  [98 F (36.7 C)-98.7 F (37.1 C)] 98.4 F (36.9 C) (03/21 1613) Pulse Rate:  [94-139] 110 (03/21 1645) Resp:  [19-39] 23 (03/21 1645) BP: (126-177)/(76-110) 171/84 (03/21 1645) SpO2:  [88 %-100 %] 96 % (03/21 1613) FiO2 (%):  [24 %] 24 % (03/21 0700) Weight:  [40.9 kg (90 lb 2.7 oz)-45.3 kg (99 lb 13.9 oz)] 41 kg (90 lb 6.2 oz) (03/21 1420)  Weight change:  Filed Weights   05/25/16 2145 05/26/16 0500 05/26/16 1420  Weight: 45.3 kg (99 lb 13.9 oz) 40.9 kg (90 lb 2.7 oz) 41 kg (90 lb 6.2 oz)    Intake/Output: I/O last 3 completed shifts: In: 70 [Other:30; IV Piggyback:50] Out: 3000 [Other:3000]   Intake/Output this shift:  Total I/O In: 50 [IV Piggyback:50] Out: -   Physical Exam: General: Critically Ill  Head: BiPap  Eyes: Anicteric, PERRL  Neck: Supple, trachea midline  Lungs:  Bilateral crackles  Heart: tachycardia  Abdomen:  Soft, nontender,   Extremities: no peripheral edema.  Neurologic: Nonfocal, moving all four extremities  Skin: No lesions  Access: Left arm AVG    Basic Metabolic Panel:  Recent Labs Lab 05/25/16 0851 05/25/16 1531 05/26/16 0526  NA 134* 135 139  K 5.2* 6.0* 4.1  CL 96* 97*  100*  CO2 28 24 31   GLUCOSE 210* 185* 116*  BUN 40* 42* 19  CREATININE 3.17* 3.48* 2.14*  CALCIUM 6.8* 7.5* 8.1*    Liver Function Tests:  Recent Labs Lab 05/25/16 0851 05/25/16 1531  AST 21 21  ALT 13* 11*  ALKPHOS 570* 587*  BILITOT 0.8 0.9  PROT 6.2* 6.3*  ALBUMIN 3.2* 3.3*    Recent Labs Lab 05/25/16 1531  LIPASE 50   No results for input(s): AMMONIA in the last 168 hours.  CBC:  Recent Labs Lab 05/25/16 0851 05/25/16 1531 05/26/16 0526  WBC 4.1 5.3 3.4*  NEUTROABS 3.1 3.9  --   HGB 8.4* 9.8* 8.0*  HCT 24.8* 29.2* 24.1*  MCV 97.7 98.8 98.8  PLT 136* 139* 117*    Cardiac Enzymes:  Recent Labs Lab 05/25/16 1531  TROPONINI 0.03*    BNP: Invalid input(s): POCBNP  CBG:  Recent Labs Lab 05/25/16 2008 05/26/16 0825 05/26/16 1219 05/26/16 1611  GLUCAP 159* 110* 143* 104*    Microbiology: Results for orders placed or performed during the hospital encounter of 05/25/16  Culture, blood (routine x 2)     Status: None (Preliminary result)   Collection Time: 05/25/16  3:31 PM  Result Value Ref Range Status   Specimen Description BLOOD PORT  Final   Special Requests BOTTLES DRAWN AEROBIC AND ANAEROBIC BCAV  Final   Culture NO GROWTH < 24 HOURS  Final   Report Status PENDING  Incomplete  Culture, blood (routine  x 2)     Status: None (Preliminary result)   Collection Time: 05/25/16  3:31 PM  Result Value Ref Range Status   Specimen Description BLOOD RIGHT AC  Final   Special Requests BOTTLES DRAWN AEROBIC AND ANAEROBIC BCAV  Final   Culture NO GROWTH < 24 HOURS  Final   Report Status PENDING  Incomplete  Urine culture     Status: Abnormal   Collection Time: 05/25/16  3:31 PM  Result Value Ref Range Status   Specimen Description URINE, RANDOM  Final   Special Requests NONE  Final   Culture MULTIPLE SPECIES PRESENT, SUGGEST RECOLLECTION (A)  Final   Report Status 05/26/2016 FINAL  Final  MRSA PCR Screening     Status: None   Collection Time:  05/25/16  8:22 PM  Result Value Ref Range Status   MRSA by PCR NEGATIVE NEGATIVE Final    Comment:        The GeneXpert MRSA Assay (FDA approved for NASAL specimens only), is one component of a comprehensive MRSA colonization surveillance program. It is not intended to diagnose MRSA infection nor to guide or monitor treatment for MRSA infections.     Coagulation Studies: No results for input(s): LABPROT, INR in the last 72 hours.  Urinalysis:  Recent Labs  05/25/16 1531  COLORURINE YELLOW*  LABSPEC 1.009  PHURINE 9.0*  GLUCOSEU 150*  HGBUR NEGATIVE  BILIRUBINUR NEGATIVE  KETONESUR NEGATIVE  PROTEINUR 100*  NITRITE NEGATIVE  LEUKOCYTESUR NEGATIVE      Imaging: Dg Chest 2 View  Result Date: 05/25/2016 CLINICAL DATA:  Shortness of breath. EXAM: CHEST  2 VIEW COMPARISON:  Radiographs of April 01, 2016. FINDINGS: Stable cardiomediastinal silhouette. Right internal jugular Port-A-Cath is unchanged in position. No pneumothorax is noted. Bilateral pulmonary edema and central pulmonary vascular congestion is noted. Moderate bilateral pleural effusions are noted which are increased compared to prior exam. Bony thorax is unremarkable. IMPRESSION: Findings consistent with bilateral pulmonary edema and pleural effusions which have increased compared to prior exam. Electronically Signed   By: Marijo Conception, M.D.   On: 05/25/2016 16:23   Dg Chest Port 1 View  Result Date: 05/26/2016 CLINICAL DATA:  51 year old female with shortness of breath. Evaluate for interval change. EXAM: PORTABLE CHEST 1 VIEW COMPARISON:  Chest radiograph dated 05/25/2016 FINDINGS: There bilateral pleural effusions with associated compressive atelectasis versus infiltrate of the lower lobes. Overall there has been interval worsening of the aeration of the lungs with increased vascular congestion and edema. There is no pneumothorax. The cardiac borders are silhouetted. Right pectoral Port-A-Cath in stable  positioning. Osteopenia with degenerative changes of the spine. Left axillary vascular stent. No acute osseous pathology. IMPRESSION: Interval worsening of the CHF and increased size of the pleural effusion with overall decreased aeration of the lungs since the most recent prior study. Electronically Signed   By: Anner Crete M.D.   On: 05/26/2016 04:59     Medications:    . citalopram  20 mg Oral Daily  . famotidine (PEPCID) IV  20 mg Intravenous Q12H  . feeding supplement (NEPRO CARB STEADY)  237 mL Oral BID BM  . heparin  5,000 Units Subcutaneous Q8H  . insulin aspart  0-9 Units Subcutaneous TID WC  . sodium chloride flush  3 mL Intravenous Q12H   sodium chloride, sodium chloride, acetaminophen, LORazepam, ondansetron (ZOFRAN) IV, oxyCODONE, sodium chloride flush  Assessment/ Plan:  [name] is a 51 y.o. Hispanic female Spanish speaking only with diabetes  mellitus type II, hypertension, multiple myeloma, ESRD on hemodialysis.   [place]    1. End Stage Renal Disease:  2. Acute respiratory failure: noninvasive ventilation 3. Anemia of chronic kidney disease 4. Secondary Hyperparathyroidism: outpatient PTH, calcium and phosphorus at goal.  5. Multiple myeloma on chemotherapy.   Plan Emergent hemodialysis last night. Resume MWF schedule. Challenge UF.  Patient with flash pulmonary edema. Concerning since her EDW is 45kg. May need to be adjusted. Discussed case with daughter.    LOS: [place], [place] 3/21/20184:54 PM

## 2016-05-26 NOTE — Progress Notes (Signed)
Post hd assessment 

## 2016-05-26 NOTE — Progress Notes (Signed)
PRE DIALYSIS ASSESSMENT 

## 2016-05-26 NOTE — Progress Notes (Signed)
POST DIALYSIS ASSESSMENT 

## 2016-05-26 NOTE — Progress Notes (Signed)
HD COMPLETED  

## 2016-05-26 NOTE — Progress Notes (Signed)
Post hd vitals 

## 2016-05-26 NOTE — Progress Notes (Signed)
  End of hd 

## 2016-05-26 NOTE — Progress Notes (Signed)
Patient alert and oriented. Vitals stable- patient completed dialysis- 2.5 liters removed.  Patient ordered to be discharged.  Roselyn Reef, RN reviewed discharge instructions and de assessed patient's right chest port.  Patient is waiting on daughter to be discharged home.

## 2016-05-26 NOTE — Care Management (Addendum)
Patient has 5 presentation over last 6 months to hospital. Patient presents this visit after a fall at cancer center (Multiple Myeloma). Patient is listed as self pay. Patient goes to [facility] for PCP.  [facility] for medications.  Patient has been informed about [place] and Medication Management on previous admissions.  Patient chronic HD patient M, W, F.  Amanda Morris with [facility] [employer]/dialysis updated on patient's status. I spoke with Elease Etienne social worker at cancer center to see what was in place already for this patient. Per Jack's response they have been helping patient with Co-pays on medications and some other bills in the past.  I have sent message to Ellie Lunch with [facility] to see if any assistance might be available or if patient has even accessed this resource. RNCM will continue to follow.

## 2016-05-26 NOTE — Progress Notes (Signed)
HD STARTED  

## 2016-05-26 NOTE — Progress Notes (Signed)
Patient rested with eyes closed most of the night. Alert upon initial assessment interpreter called to assist with admission and assessment and making sure patient was aware of plan of care. When interpreter arrived patient no longer alert, ativan administered in ED prior to transfer to ICU. Patient received HD during the shift with periods of restlessness PRN ativan administered once. 3 liters removed during dialysis per HD nurse. Patient remained on bipap throughout night without distress. Breathing does not appear labored at this time, patient responsive to voice. Will transition off bipap when fully awake.

## 2016-05-26 NOTE — Discharge Instructions (Signed)
Hiperpotasiemia (Hyperkalemia) La hiperpotasiemia se produce cuando hay demasiado potasio en la sangre. Normalmente, los riones eliminan el potasio del cuerpo. El exceso de potasio en la sangre puede afectar el funcionamiento del corazn. Heber causas de la hiperpotasiemia, se incluyen las siguientes:  Consumo excesivo de [place]. Esto puede deberse a:  Uso de sustitutos de la sal. Estos sustitutos contienen mucha cantidad de potasio.  Consumo de suplementos de potasio.  Consumo de alimentos con alto contenido de potasio.  Eliminacin de poca cantidad de potasio. Esto puede suceder si:  Los riones no funcionan como corresponde. La enfermedad en los riones (renal), incluida la insuficiencia renal a corto o largo plazo, es una causa muy frecuente de hiperpotasiemia.  Est tomando medicamentos que reduzcan la eliminacin de potasio.  Tiene la enfermedad de [place].  Tiene una obstruccin en las vas urinarias, como piedras en los riones.  Est en tratamiento para limpiar la sangre de forma mecnica (dilisis) y omite una sesin.  Liberacin de una alta cantidad de potasio de las clulas en la sangre. Esto puede suceder debido a lo siguiente:  [place] (rabdomilisis) u otros tejidos. La mayor parte del potasio se almacena en los msculos.  Infecciones o quemaduras graves.  Plasma sanguneo cido (acidosis). La acidosis puede aparecer por muchas enfermedades, como la diabetes no controlada. FACTORES DE RIESGO El factor de riesgo ms frecuente de la hiperpotasiemia es la enfermedad renal. Entre los otros factores de riesgo de hiperpotasiemia, se incluyen los siguientes:  Enfermedad de [place]. En esta afeccin, las glndulas no producen la cantidad suficiente de hormonas.  Alcoholismo o abuso de drogas.  Uso de determinados medicamentos para la presin arterial, como inhibidores de la enzima convertidora de la angiotensina (IECA), antagonistas de los  receptores de la angiotensinaII (ARA) o diurticos ahorradores de Field seismologist, Designer, fashion/clothing.  Lesin o quemadura grave. SIGNOS Y SNTOMAS A menudo, la hiperpotasiemia no causa signos ni sntomas. Sin embargo, cuando el nivel de potasio se eleva demasiado, puede presentar los siguientes sntomas:  Latidos cardacos irregulares o muy lentos.  Nuseas.  Fatiga.  Hormigueo en la piel o adormecimiento de las manos o los pies.  Debilidad muscular.  Fatiga.  Incapacidad de moverse (parlisis). Es posible que no tenga ningn sntoma de hiperpotasiemia. DIAGNSTICO La hiperpotasiemia puede diagnosticarse de la siguiente manera:  Examen fsico.  Anlisis de sangre.  ECG (electrocardiograma).  Anlisis sobre el uso de medicamentos recetados y no recetados. TRATAMIENTO El tratamiento de la hiperpotasiemia a menudo se [place] a la causa preexistente. En algunos casos, el tratamiento puede incluir lo siguiente:  Insulina.  Solucin de glucosa (azcar) y agua administrada a travs de una vena (intravenosa o IV).  Dilisis.  Medicamentos para eliminar el potasio del cuerpo.  Medicamentos para enviar el potasio desde el torrente [employer] tejidos. [place] los medicamentos solamente como se lo haya indicado el mdico.  No use ningn suplemento, producto natural, hierba o vitamina sin antes consultarlo con su mdico. Determinados suplementos y productos alimenticios naturales pueden tener mucha cantidad de potasio.  Limite el consumo de alcohol como se lo haya indicado el mdico.  No consuma drogas. Si necesita ayuda para dejar de fumar, consulte al mdico.  Concurra a todas las visitas de control como se lo haya indicado el mdico. Esto es importante.  Si tiene una enfermedad renal, es posible que deba seguir una dieta baja en potasio. Un nutricionista puede ayudarlo a [employer]  con bajo contenido de  potasio. SOLICITE ATENCIN MDICA SI:  Nota que tiene latidos cardacos irregulares o muy lentos.  Tiene sensacin de desvanecimiento.  Se siente dbil.  Siente nuseas.  Siente hormigueo o adormecimiento en las manos o los pies. SOLICITE ATENCIN MDICA DE INMEDIATO SI:  Le falta el aire.  Siente dolor u opresin en el pecho.  Se desmaya.  Tiene parlisis muscular. ASEGRESE DE QUE:  Comprende estas instrucciones.  Controlar su afeccin.  Recibir ayuda de inmediato si no mejora o si empeora. Esta informacin no tiene Marine scientist el consejo del mdico. Asegrese de hacerle al mdico cualquier pregunta que tenga. Document Released: 02/22/2005 Document Revised: 03/15/2014 Document Reviewed: 05/30/2013 Elsevier Interactive Patient Education  2017 [place] (Allergies) La alergia ocurre cuando el cuerpo reacciona a una sustancia de un modo que no es normal. Una reaccin alrgica puede producirse despus de cualquiera de estas acciones:  Comer algo.  Inhalar algo.  Tocar algo. CULES SON LAS CLASES DE ALERGIAS? Se puede ser alrgico a lo siguiente:  Cosas que surgen solo durante ciertas temporadas, como moho y [place].  Alimentos.  Medicamentos.  Insectos.  Caspa de los [place]. CULES SON LOS SNTOMAS DE LAS ALERGIAS?  Hinchazn. Puede aparecer en los labios, la cara, la lengua, la boca o la garganta.  Estornudos.  Tos.  Respiracin ruidosa (sibilancias).  Nariz tapada.  Hormigueo en la boca.  Una erupcin.  Picazn.  Zonas de piel hinchadas, rojas y que producen picazn (ronchas).  Lagrimeo.  Vmitos.  Deposiciones lquidas (diarrea).  Mareos.  Desmayo o sensacin de desvanecimiento.  Problemas para respirar o tragar.  Sensacin de opresin en el pecho.  Latidos cardacos acelerados. CMO SE DIAGNOSTICAN LAS ALERGIAS? Las alergias pueden diagnosticarse mediante lo siguiente:  Antecedentes mdicos y  familiares.  Pruebas cutneas.  Anlisis de [place].  Un registro de alimentos. Un registro de [employer], las bebidas y los sntomas diarios.  Los resultados de una dieta de eliminacin. Esta dieta implica asegurarse de no comer determinados alimentos y [employer] ver qu sucede cuando comienza a comerlos de nuevo. CMO SE TRATAN LAS ALERGIAS? No hay una cura para las alergias, pero las reacciones alrgicas pueden tratarse con medicamentos. Generalmente, las reacciones graves deben tratarse en un hospital. [place]? La mejor manera de prevenir una reaccin alrgica es evitar el elemento que le causa Buyer, retail. Las vacunas y los medicamentos para la alergia tambin pueden ayudar a prevenir las reacciones en [employer]. Esta informacin no tiene Marine scientist el consejo del mdico. Asegrese de hacerle al mdico cualquier pregunta que tenga. Document Released: 10/25/2012 Document Revised: 03/15/2014 Document Reviewed: 12/04/2013 Elsevier Interactive Patient Education  2017 [place] (Hyperventilation) La hiperventilacin es la respiracin ms profunda y ms rpida que lo normal. Por lo general, un episodio de hiperventilacin dura de 20 a 60minutos. Durante el episodio:  Environmental education officer.  Puede sentir hormigueos, calambres o adormecimiento en las manos, los pies o la boca. El estrs, la ansiedad o las emociones suelen desencadenar episodios de hiperventilacin. Sin embargo, tambin pueden ser un signo de otro problema, por ejemplo:  Un problema pulmonar, como enfisema o asma.  Una infeccin.  Problemas cardacos.  Embarazo.  Hemorragia. INSTRUCCIONES PARA EL CUIDADO EN EL HOGAR  Aprenda y haga ejercicios que le ayuden a respirar desde el diafragma y el abdomen.  Practique tcnicas de relajacin para reducir [employer], tales como  la visualizacin, la meditacin y la liberacin  de los msculos.  Durante un ataque, trate de respirar dentro de una bolsa de papel. Esto enlentece la respiracin. SOLICITE ATENCIN MDICA SI:  Sigue teniendo episodios de hiperventilacin.  La hiperventilacin empeora. Esta informacin no tiene Marine scientist el consejo del mdico. Asegrese de hacerle al mdico cualquier pregunta que tenga. Document Released: 02/22/2005 Document Revised: 08/24/2011 Document Reviewed: 12/21/2014 Elsevier Interactive Patient Education  2017 [employer]. Please call your regular doctor and kidney doctor to make an appointment as soon as possible after you get out of the hospital.   Llame a su mdico habitual y a su mdico especialista en riones para programar una cita tan pronto como sea posible despus de que salga del hospital.

## 2016-05-26 NOTE — Progress Notes (Signed)
Initial Nutrition Assessment  DOCUMENTATION CODES:   Severe malnutrition in context of chronic illness  INTERVENTION:  -Recommend Nepro BID -Pt will receive renal bedtime snack -If po intake not adequate on follow-up, recommend liberalizing diet   NUTRITION DIAGNOSIS:   Malnutrition related to chronic illness as evidenced by severe depletion of body fat, severe depletion of muscle mass, percent weight loss.  GOAL:   Patient will meet greater than or equal to 90% of their needs  MONITOR:   PO intake, Supplement acceptance, Labs, Weight trends  REASON FOR ASSESSMENT:   Malnutrition Screening Tool    ASSESSMENT:    51 yo female admitted with N/V/D for a few days as well as acute on chronic respiratory failure with pulmonary edema/pleural effusions. Hyperkalemia with ESRD on HD and no missed HD sessions. Pt with multiple myeloma current receiving treatment, DM, HTN  Pt spanish speaking only, interpreter present.  Pt good appetite this AM, hungry. Pt ate eggs and bagel with milk and coffee with breakfast. Pt reports she has been eating well at home. Pt typically eats 3 meals and 1 snack per day plus 2-3 Ensure per day. Typical day is milk and cookies for breakfast, sandwich for lunch, tortilla, meat, beans, rice for dinner. Pt typically eats a piece of fruit as snack.  Pt acknowledges wt loss but unsure how much, UBW of 125 pounds.  Noted EDW 45 kg (99 pounds); current wt 40.9 kg (9.1% wt loss but unsure of time frame); pt with measured weight of 103 pounds 03/2016 (12.6% since January which is significant for time frame)  Nutrition-Focused physical exam completed. Findings are moderate to severe fat depletion, moderate to severe muscle depletion, and no edema.   Labs: no phosphorus, potassium now wdl  Meds: ss novolog  Diet Order:  Diet renal with fluid restriction Fluid restriction: 1200 mL Fluid; Room service appropriate? Yes; Fluid consistency: Thin  Skin:  Reviewed, no  issues  Last BM:  no documented BM  Height:   Ht Readings from Last 1 Encounters:  04/01/16 5' (1.524 m)    Weight:   Wt Readings from Last 1 Encounters:  05/26/16 90 lb 2.7 oz (40.9 kg)    Ideal Body Weight:     BMI:  Body mass index is 17.61 kg/m.  Estimated Nutritional Needs:   Kcal:  1400-1700 kcals  Protein:  65-80 g  Fluid:  1000 mL plus UOP  EDUCATION NEEDS:   No education needs identified at this time  [place], [place], [place] [phone] Pager  [phone] Weekend/On-Call Pager

## 2016-05-27 LAB — HEPATITIS B SURFACE ANTIBODY,QUALITATIVE: Hep B S Ab: NONREACTIVE

## 2016-05-27 LAB — HEPATITIS B SURFACE ANTIGEN: Hepatitis B Surface Ag: NEGATIVE

## 2016-05-27 LAB — HEPATITIS B CORE ANTIBODY, TOTAL: Hep B Core Total Ab: NEGATIVE

## 2016-05-30 LAB — CULTURE, BLOOD (ROUTINE X 2)
CULTURE: NO GROWTH
CULTURE: NO GROWTH

## 2016-05-30 NOTE — Progress Notes (Signed)
[place]  [phone] [phone]  ID: Jamie Keller OB: [birth year]  MR#: [id number]  GNO#:[id number]  [name], MD as PCP - General (Family Medicine)  CHIEF COMPLAINT: Multiple Myeloma in relapse with multiple bony lytic lesions, now with end-stage renal disease on dialysis.   INTERVAL HISTORY: Patient returns to clinic today for further evaluation and consideration of cycle 4 of single agent daratumumab. She continues to feel chronically weak and fatigued. Her pain is unchanged. Her mother reports continued hallucinations and "jerking" her legs, but no other neurologic complaints. She denies any fever or chills. She has a poor appetite, but her weight has remained stable.  She continues to have chronic back pain that would occasionally radiate down her leg.  She has dyspnea on exertion, but denies cough, hemoptysis, or chest pain. She complains of mild nausea and occasional vomiting. Patient offers no further specific complaints today.   REVIEW OF SYSTEMS:   Review of Systems  Constitutional: Positive for malaise/fatigue. Negative for chills, fever and weight loss.  HENT: Negative for congestion.   Respiratory: Negative for cough and shortness of breath.   Cardiovascular: Negative.  Negative for chest pain and leg swelling.  Gastrointestinal: Negative for abdominal pain, blood in stool, constipation, diarrhea, nausea and vomiting.  Genitourinary: Negative for hematuria.       On dialysis  Musculoskeletal: Positive for back pain.  Skin: Negative for itching and rash.  Neurological: Positive for tingling, sensory change and weakness. Negative for focal weakness and headaches.  Psychiatric/Behavioral: Positive for hallucinations. The patient is nervous/anxious and has insomnia.    As per HPI. Otherwise, a complete review of systems is negative.   PAST MEDICAL HISTORY: Past Medical History:  Diagnosis  Date  . Anemia   . Anxiety   . Chronic kidney disease    DIALYSIS  . Depression   . Diabetes mellitus without complication ([place])   . Dialysis patient ([place])    Mon, Wed, Fri  . History of chemotherapy    chemo on Tuesday and Wednesday  . Hypertension   . Multiple myeloma ([place])   . Neuropathy associated with cancer ([place])   . Osteoarthritis   . Pancreatitis   . Pneumonia    September 2017, [facility]  . Post-menopausal 2014  . Sepsis ([place])     PAST SURGICAL HISTORY: Past Surgical History:  Procedure Laterality Date  . AV FISTULA PLACEMENT Left 02/20/2016   Procedure: INSERTION OF ARTERIOVENOUS (AV) GORE-TEX GRAFT ARM;  Surgeon: Katha Cabal, MD;  Location: [facility] ORS;  Service: Vascular;  Laterality: Left;  . CESAREAN SECTION     x2  . dialysis catheter placement    . PERIPHERAL VASCULAR CATHETERIZATION N/A 11/19/2015   Procedure: Dialysis/Perma Catheter Insertion;  Surgeon: Algernon Huxley, MD;  Location: [place] [facility];  Service: Cardiovascular;  Laterality: N/A;  . PERIPHERAL VASCULAR CATHETERIZATION Left 03/10/2016   Procedure: Thrombectomy;  Surgeon: Katha Cabal, MD;  Location: [place] [facility];  Service: Cardiovascular;  Laterality: Left;  . PORTACATH PLACEMENT    . TUBAL LIGATION      FAMILY HISTORY: Reviewed and unchanged. No reported history of malignancy or chronic disease.     ADVANCED DIRECTIVES:    HEALTH MAINTENANCE: Social History  Substance Use Topics  . Smoking status: Never Smoker  . Smokeless tobacco: Never Used  . Alcohol use No     No Known Allergies  Current Outpatient Prescriptions  Medication Sig Dispense Refill  .  acetaminophen (TYLENOL) 500 MG tablet Take 500 mg by mouth every 6 (six) hours as needed.    . citalopram (CELEXA) 20 MG tablet Take 1 tablet (20 mg total) by mouth daily. 30 tablet 5  . HYDROcodone-acetaminophen (NORCO) 5-325 MG tablet Take 1-2 tablets by mouth every 6 (six) hours as needed for moderate pain or severe  pain. 50 tablet 0  . Insulin Detemir (LEVEMIR FLEXPEN) 100 UNIT/ML Pen Inject 5 Units into the skin daily at 10 pm.    . VERAPAMIL HCL ER PO Take 120 mg by mouth daily.    Marland Kitchen dronabinol (MARINOL) 2.5 MG capsule Take 1 capsule (2.5 mg total) by mouth 2 (two) times daily before a meal. (Patient not taking: Reported on 03/16/2016) 60 capsule 0  . ferrous sulfate 325 (65 FE) MG tablet Take 325 mg by mouth daily with breakfast.    . furosemide (LASIX) 80 MG tablet Take 1 tablet (80 mg total) by mouth daily. (Patient not taking: Reported on 03/16/2016) 30 tablet 0  . promethazine (PHENERGAN) 25 MG tablet Take 25 mg by mouth every 6 (six) hours as needed for nausea or vomiting.     No current facility-administered medications for this visit.    Facility-Administered Medications Ordered in Other Visits  Medication Dose Route Frequency Provider Last Rate Last Dose  . heparin lock flush 100 unit/mL  500 Units Intravenous Once Lloyd Huger, MD      . sodium chloride flush (NS) 0.9 % injection 10 mL  10 mL Intravenous Once Lloyd Huger, MD        Vitals:   06/01/16 0924  BP: (!) 147/84  Pulse: 85  Resp: 18  Temp: 97.4 F (36.3 C)   ECOG FS:2 - Symptomatic  General: No acute distress. Eyes: anicteric sclera. Lungs: Clear to auscultation bilaterally. Heart: Regular rate and rhythm. No rubs, murmurs, or gallops. Abdomen: Soft, nontender, nondistended. No organomegaly noted, normoactive bowel sounds. Musculoskeletal: No edema, cyanosis, or clubbing. Neuro: Alert, answering all questions appropriately. Cranial nerves grossly intact. Skin: No rashes or petechiae noted.  Psych: Normal affect.   LAB RESULTS:  CBC    Component Value Date/Time   WBC 3.7 06/01/2016 0849   RBC 2.82 (L) 06/01/2016 0849   HGB 9.3 (L) 06/01/2016 0849   HGB 11.7 (L) 06/27/2014 1344   HCT 27.3 (L) 06/01/2016 0849   HCT 34.0 (L) 06/27/2014 1344   PLT 149 (L) 06/01/2016 0849   PLT 267 06/27/2014 1344   MCV  97.0 06/01/2016 0849   MCV 88 06/27/2014 1344   MCH 33.1 06/01/2016 0849   MCHC 34.1 06/01/2016 0849   RDW 19.1 (H) 06/01/2016 0849   RDW 13.4 06/27/2014 1344   LYMPHSABS 0.4 (L) 06/01/2016 0849   LYMPHSABS 0.9 (L) 06/27/2014 1344   MONOABS 0.5 06/01/2016 0849   MONOABS 0.9 06/27/2014 1344   EOSABS 0.0 06/01/2016 0849   EOSABS 0.2 06/27/2014 1344   BASOSABS 0.0 06/01/2016 0849   BASOSABS 0.1 06/27/2014 1344   BMET    Component Value Date/Time   NA 134 (L) 06/01/2016 0849   NA 137 06/27/2014 1344   K 4.8 06/01/2016 0849   K 4.0 06/27/2014 1344   CL 96 (L) 06/01/2016 0849   CL 106 06/27/2014 1344   CO2 27 06/01/2016 0849   CO2 26 06/27/2014 1344   GLUCOSE 197 (H) 06/01/2016 0849   GLUCOSE 83 06/27/2014 1344   BUN 46 (H) 06/01/2016 0849   BUN 27 (H) 06/27/2014 1344  CREATININE 3.54 (H) 06/01/2016 0849   CREATININE 1.36 (H) 06/27/2014 1344   CALCIUM 7.4 (L) 06/01/2016 0849   CALCIUM 9.1 06/27/2014 1344   GFRNONAA 14 (L) 06/01/2016 0849   GFRNONAA 46 (L) 06/27/2014 1344   GFRAA 16 (L) 06/01/2016 0849   GFRAA 53 (L) 06/27/2014 1344   Lab Results  Component Value Date   TOTALPROTELP 5.1 (L) 05/18/2016   ALBUMINELP 3.0 05/18/2016   A1GS 0.3 05/18/2016   A2GS 0.7 05/18/2016   BETS 0.7 05/18/2016   GAMS 0.4 05/18/2016   MSPIKE Not Observed 05/18/2016   SPEI Comment 05/18/2016        STUDIES: No results found.  ASSESSMENT: Multiple Myeloma in relapse with multiple bony lytic lesions, end-stage renal disease.   PLAN:    1. Multiple Myeloma in relapse with multiple bony lytic lesions: PET scan results reviewed independently consistent with progression of disease. Patient's kappa free light chains have trended down significantly. Daratumumab does not need to be dose reduced in the setting of end-stage renal disease and dialysis. Hold Xgeva given her history of hypocalcemia. Because of patient's immigration status, she could not undergo transplant. Proceed with cycle 4  today. Return to clinic in 1 week for consideration of cycle 5 of 8 of weekly daratumumab. After the patient receives 8 weekly cycles, we will switch to treatment every 2 weeks for an additional 8 cycles. At that point patient can be switched to a maintenance monthly regimen. 2. Back pain: Continue current narcotics as prescribed.  3. End-stage renal disease: Likely secondary to progression of disease. Continue dialysis on Monday, Wednesdays and Fridays. Has fistula. 4. Anemia: Hemoglobin decreased. Patient does not require transfusion at this time.  5. Hyperglycemia: Patient's blood glucose has improved since initiating insulin. Continue monitoring and treatment by primary care. Monitor closely with solumedrol as a pre-medication. 6. Thrombocytopenia: Resolved. 7. Depression: Continue Celexa 20 mg daily. Patient was given a refill today. 8. Weight Loss: Stable without medication. Patient stopped taking Marinol due to making her "feel funny". She states that she has an improved appetite without the medication. Will continue to monitor. 9. Constipation: Resolved. Take OTC medications if needed. Monitor.  10. Pruritus: Resolved. Patient states Benadryl works, but only for several hours. Continue Atarax as needed. 11. Confusion/hallucinations: MRI brian is negative. 12. Hypocalcemia: Patient received 2 g IV calcium gluconate today.   Interpreter present during entire clinic visit.  Patient expressed understanding and was in agreement with this plan. She also understands that She can call clinic at any time with any questions, concerns, or complaints.    Lloyd Huger, MD 06/06/16 11:42 PM

## 2016-06-01 ENCOUNTER — Inpatient Hospital Stay: Payer: Self-pay

## 2016-06-01 ENCOUNTER — Inpatient Hospital Stay (HOSPITAL_BASED_OUTPATIENT_CLINIC_OR_DEPARTMENT_OTHER): Payer: Self-pay | Admitting: Oncology

## 2016-06-01 VITALS — BP 147/84 | HR 85 | Temp 97.4°F | Resp 18 | Wt 90.7 lb

## 2016-06-01 DIAGNOSIS — Z8701 Personal history of pneumonia (recurrent): Secondary | ICD-10-CM

## 2016-06-01 DIAGNOSIS — R630 Anorexia: Secondary | ICD-10-CM

## 2016-06-01 DIAGNOSIS — N186 End stage renal disease: Secondary | ICD-10-CM

## 2016-06-01 DIAGNOSIS — R5383 Other fatigue: Secondary | ICD-10-CM

## 2016-06-01 DIAGNOSIS — C9002 Multiple myeloma in relapse: Secondary | ICD-10-CM

## 2016-06-01 DIAGNOSIS — I129 Hypertensive chronic kidney disease with stage 1 through stage 4 chronic kidney disease, or unspecified chronic kidney disease: Secondary | ICD-10-CM

## 2016-06-01 DIAGNOSIS — Z992 Dependence on renal dialysis: Secondary | ICD-10-CM

## 2016-06-01 DIAGNOSIS — R410 Disorientation, unspecified: Secondary | ICD-10-CM

## 2016-06-01 DIAGNOSIS — R112 Nausea with vomiting, unspecified: Secondary | ICD-10-CM

## 2016-06-01 DIAGNOSIS — Z8719 Personal history of other diseases of the digestive system: Secondary | ICD-10-CM

## 2016-06-01 DIAGNOSIS — D649 Anemia, unspecified: Secondary | ICD-10-CM

## 2016-06-01 DIAGNOSIS — G4700 Insomnia, unspecified: Secondary | ICD-10-CM

## 2016-06-01 DIAGNOSIS — R0602 Shortness of breath: Secondary | ICD-10-CM

## 2016-06-01 DIAGNOSIS — F419 Anxiety disorder, unspecified: Secondary | ICD-10-CM

## 2016-06-01 DIAGNOSIS — M1990 Unspecified osteoarthritis, unspecified site: Secondary | ICD-10-CM

## 2016-06-01 DIAGNOSIS — M545 Low back pain: Secondary | ICD-10-CM

## 2016-06-01 DIAGNOSIS — E1122 Type 2 diabetes mellitus with diabetic chronic kidney disease: Secondary | ICD-10-CM

## 2016-06-01 DIAGNOSIS — D696 Thrombocytopenia, unspecified: Secondary | ICD-10-CM

## 2016-06-01 DIAGNOSIS — R531 Weakness: Secondary | ICD-10-CM

## 2016-06-01 DIAGNOSIS — F329 Major depressive disorder, single episode, unspecified: Secondary | ICD-10-CM

## 2016-06-01 LAB — COMPREHENSIVE METABOLIC PANEL
ALT: 10 U/L — ABNORMAL LOW (ref 14–54)
ANION GAP: 11 (ref 5–15)
AST: 23 U/L (ref 15–41)
Albumin: 3.2 g/dL — ABNORMAL LOW (ref 3.5–5.0)
Alkaline Phosphatase: 792 U/L — ABNORMAL HIGH (ref 38–126)
BUN: 46 mg/dL — ABNORMAL HIGH (ref 6–20)
CHLORIDE: 96 mmol/L — AB (ref 101–111)
CO2: 27 mmol/L (ref 22–32)
Calcium: 7.4 mg/dL — ABNORMAL LOW (ref 8.9–10.3)
Creatinine, Ser: 3.54 mg/dL — ABNORMAL HIGH (ref 0.44–1.00)
GFR calc Af Amer: 16 mL/min — ABNORMAL LOW (ref >60)
GFR, EST NON AFRICAN AMERICAN: 14 mL/min — AB (ref >60)
Glucose, Bld: 197 mg/dL — ABNORMAL HIGH (ref 65–99)
POTASSIUM: 4.8 mmol/L (ref 3.5–5.1)
Sodium: 134 mmol/L — ABNORMAL LOW (ref 135–145)
Total Bilirubin: 0.8 mg/dL (ref 0.3–1.2)
Total Protein: 6.2 g/dL — ABNORMAL LOW (ref 6.5–8.1)

## 2016-06-01 LAB — CBC WITH DIFFERENTIAL/PLATELET
Basophils Absolute: 0 10*3/uL (ref 0–0.1)
Basophils Relative: 1 %
EOS PCT: 0 %
Eosinophils Absolute: 0 10*3/uL (ref 0–0.7)
HCT: 27.3 % — ABNORMAL LOW (ref 35.0–47.0)
Hemoglobin: 9.3 g/dL — ABNORMAL LOW (ref 12.0–16.0)
Lymphocytes Relative: 11 %
Lymphs Abs: 0.4 10*3/uL — ABNORMAL LOW (ref 1.0–3.6)
MCH: 33.1 pg (ref 26.0–34.0)
MCHC: 34.1 g/dL (ref 32.0–36.0)
MCV: 97 fL (ref 80.0–100.0)
MONO ABS: 0.5 10*3/uL (ref 0.2–0.9)
Monocytes Relative: 14 %
Neutro Abs: 2.7 10*3/uL (ref 1.4–6.5)
Neutrophils Relative %: 74 %
PLATELETS: 149 10*3/uL — AB (ref 150–440)
RBC: 2.82 MIL/uL — ABNORMAL LOW (ref 3.80–5.20)
RDW: 19.1 % — AB (ref 11.5–14.5)
WBC: 3.7 10*3/uL (ref 3.6–11.0)

## 2016-06-01 MED ORDER — DIPHENHYDRAMINE HCL 25 MG PO CAPS
25.0000 mg | ORAL_CAPSULE | Freq: Once | ORAL | Status: AC
  Administered 2016-06-01: 25 mg via ORAL
  Filled 2016-06-01: qty 1

## 2016-06-01 MED ORDER — SODIUM CHLORIDE 0.9 % IV SOLN
Freq: Once | INTRAVENOUS | Status: AC
  Administered 2016-06-01: 10:00:00 via INTRAVENOUS
  Filled 2016-06-01: qty 1000

## 2016-06-01 MED ORDER — ACETAMINOPHEN 325 MG PO TABS
650.0000 mg | ORAL_TABLET | Freq: Once | ORAL | Status: AC
  Administered 2016-06-01: 650 mg via ORAL
  Filled 2016-06-01: qty 2

## 2016-06-01 MED ORDER — METHYLPREDNISOLONE SODIUM SUCC 125 MG IJ SOLR
125.0000 mg | Freq: Once | INTRAMUSCULAR | Status: AC
  Administered 2016-06-01: 125 mg via INTRAVENOUS
  Filled 2016-06-01: qty 2

## 2016-06-01 MED ORDER — HEPARIN SOD (PORK) LOCK FLUSH 100 UNIT/ML IV SOLN
500.0000 [IU] | Freq: Once | INTRAVENOUS | Status: AC | PRN
  Administered 2016-06-01: 500 [IU]
  Filled 2016-06-01: qty 5

## 2016-06-01 MED ORDER — SODIUM CHLORIDE 0.9 % IV SOLN
16.0000 mg/kg | Freq: Once | INTRAVENOUS | Status: AC
  Administered 2016-06-01: 720 mg via INTRAVENOUS
  Filled 2016-06-01: qty 36

## 2016-06-01 MED ORDER — SODIUM CHLORIDE 0.9 % IV SOLN
2.0000 g | Freq: Once | INTRAVENOUS | Status: AC
  Administered 2016-06-01: 2 g via INTRAVENOUS
  Filled 2016-06-01: qty 20

## 2016-06-01 MED ORDER — HEPARIN SOD (PORK) LOCK FLUSH 100 UNIT/ML IV SOLN
INTRAVENOUS | Status: AC
  Filled 2016-06-01: qty 5

## 2016-06-01 MED ORDER — PROCHLORPERAZINE MALEATE 10 MG PO TABS
10.0000 mg | ORAL_TABLET | Freq: Once | ORAL | Status: AC
  Administered 2016-06-01: 10 mg via ORAL
  Filled 2016-06-01: qty 1

## 2016-06-01 NOTE — Progress Notes (Signed)
States is feeling tired today.

## 2016-06-03 ENCOUNTER — Inpatient Hospital Stay: Payer: Self-pay

## 2016-06-03 NOTE — Progress Notes (Addendum)
Nutrition Assessment   Reason for Assessment:   Referral from MD De Baca regarding weight loss  ASSESSMENT:  51 year old female with multiple myeloma.  Past medical history of ESRD on HD MWF, DM, HTN, pancreatitis  Patient seen in clinic this am with mother and interpreter present.  Patient reports decreased appetite since June of 2017 after finding out she has cancer.  Patient reports some nausea but controlled with medication.  Reports some taste changes as well.  Patient report typical day is ensure (later says glucerna) around 7am.  Reports around 11am eats 2 eggs with corn tortillas, then around 4-5pm eats chicken or beef with beans and rice. Snacks on fruits between meals or cookies.  Reports dialysis fluid goal is 5, 8oz cups per day.    Patient reports that she has tried nepro but does not like it.    Noted has tried marinol in the past but makes her "feel funny"  Nutrition Focused Physical Exam: Nutrition-Focused physical exam completed. Findings are severe fat depletion in all areas, severe muscle depletion in all areas, and no edema.     Medications: levemir, compazine  Labs: Na 134, glucose 197, BUN 46, creatinine 3.54  Anthropometrics:   Height: 60 inches Weight: 90 lb UBW: 125-130 lb in June 2017 BMI: 17 30 % weight loss in the last 9 months, significant   Estimated Energy Needs  Kcals: 8891-6945 calories/d Protein: 49-62 g/d Fluid: 1061m + urine output  NUTRITION DIAGNOSIS: Severe malnutrition related to multiple myeloma, ESRD on HD and chemotherapy as evidenced by 30% weight loss in the last 9 months, severe muscle mass loss in all areas and severe fat depletion in all areas.    MALNUTRITION DIAGNOSIS: Severe malnutrition in context of chronic illness (multiple myeloma, ESRD on HD) as evidenced by 30% weight loss in the last 9 months and severe muscle mass loss in all areas and severe fat depletion in all areas.   INTERVENTION:   Discussed ways to  increase calories and protein.  Food examples provided for patient. Encouraged patient to eat every 2-3 hours to provide additional calories and protein Discussed difference between oral nutrition supplements (ie glucerna with 180 calories in 8oz container).  Patient does not like nepro per her report.  Encouraged ensure enlive, boost plus with 350-360 calories at this time even though these are higher in carbohydrate and K patient needs additional calories to improve nutrition.  Samples provided and coupons provided. Noted has tried marinol question if able to tolerate another appetite stimulant.  [facility] of care.  RD reports food items have been liberalized but concerned about elevated K levels at dialysis.  Dialysis RD agreeable with current plan to liberalize diet and continue with boost plus, ensure enlive supplements at this time as need additional kcals despite supplement higher in K.    MONITORING, EVALUATION, GOAL: Patient will consume adequate calories and protein to improve nutrition and prevent weight loss   NEXT VISIT: May 3rd  Jaikob Borgwardt B. AZenia Resides RCastle Shannon LEastlakeRegistered Dietitian [phone](pager)

## 2016-06-07 ENCOUNTER — Encounter (INDEPENDENT_AMBULATORY_CARE_PROVIDER_SITE_OTHER): Payer: Self-pay

## 2016-06-07 ENCOUNTER — Ambulatory Visit (INDEPENDENT_AMBULATORY_CARE_PROVIDER_SITE_OTHER): Payer: Self-pay | Admitting: Vascular Surgery

## 2016-06-07 NOTE — Progress Notes (Signed)
[place]  [phone] [phone]  ID: Jamie Keller OB: 1965-12-25  MR#: [id number]  GEZ#:[id number]  [name], MD as PCP - General (Family Medicine)  CHIEF COMPLAINT: Multiple Myeloma in relapse with multiple bony lytic lesions, now with end-stage renal disease on dialysis.   INTERVAL HISTORY: Patient returns to clinic today for further evaluation and consideration of cycle 5 of single agent daratumumab. She continues to feel chronically weak and fatigued. Her pain is unchanged. Her mother reports continued hallucinations and "jerking" her legs, but no other neurologic complaints. She also admits these have improved. She denies any fever or chills. She has a poor appetite, but her weight has remained stable.  She continues to have chronic back pain that would occasionally radiate down her leg.  She has dyspnea on exertion, but denies cough, hemoptysis, or chest pain. She complains of mild nausea and occasional vomiting. Patient offers no further specific complaints today.   REVIEW OF SYSTEMS:   Review of Systems  Constitutional: Positive for malaise/fatigue. Negative for chills, fever and weight loss.  HENT: Negative for congestion.   Respiratory: Positive for shortness of breath. Negative for cough.   Cardiovascular: Negative.  Negative for chest pain and leg swelling.  Gastrointestinal: Negative for abdominal pain, blood in stool, constipation, diarrhea, nausea and vomiting.  Genitourinary: Negative for hematuria.       On dialysis  Musculoskeletal: Positive for back pain.  Skin: Negative for itching and rash.  Neurological: Positive for tingling, sensory change and weakness. Negative for focal weakness and headaches.  Psychiatric/Behavioral: Positive for hallucinations. The patient is nervous/anxious and has insomnia.    As per HPI. Otherwise, a complete review of systems is negative.   PAST MEDICAL  HISTORY: Past Medical History:  Diagnosis Date  . Anemia   . Anxiety   . Chronic kidney disease    DIALYSIS  . Depression   . Diabetes mellitus without complication ([place])   . Dialysis patient ([place])    Mon, Wed, Fri  . History of chemotherapy    chemo on Tuesday and Wednesday  . Hypertension   . Multiple myeloma ([place])   . Neuropathy associated with cancer ([place])   . Osteoarthritis   . Pancreatitis   . Pneumonia    September 2017, [facility]  . Post-menopausal 2014  . Sepsis ([place])     PAST SURGICAL HISTORY: Past Surgical History:  Procedure Laterality Date  . AV FISTULA PLACEMENT Left 02/20/2016   Procedure: INSERTION OF ARTERIOVENOUS (AV) GORE-TEX GRAFT ARM;  Surgeon: Katha Cabal, MD;  Location: [facility] ORS;  Service: Vascular;  Laterality: Left;  . CESAREAN SECTION     x2  . dialysis catheter placement    . PERIPHERAL VASCULAR CATHETERIZATION N/A 11/19/2015   Procedure: Dialysis/Perma Catheter Insertion;  Surgeon: Algernon Huxley, MD;  Location: [place] CV LAB;  Service: Cardiovascular;  Laterality: N/A;  . PERIPHERAL VASCULAR CATHETERIZATION Left 03/10/2016   Procedure: Thrombectomy;  Surgeon: Katha Cabal, MD;  Location: [place] CV LAB;  Service: Cardiovascular;  Laterality: Left;  . PORTACATH PLACEMENT    . TUBAL LIGATION      FAMILY HISTORY: Reviewed and unchanged. No reported history of malignancy or chronic disease.     ADVANCED DIRECTIVES:    HEALTH MAINTENANCE: Social History  Substance Use Topics  . Smoking status: Never Smoker  . Smokeless tobacco: Never Used  . Alcohol use No     No Known Allergies  Current Outpatient  Prescriptions  Medication Sig Dispense Refill  . acetaminophen (TYLENOL) 500 MG tablet Take 500 mg by mouth every 6 (six) hours as needed.    . citalopram (CELEXA) 20 MG tablet Take 1 tablet (20 mg total) by mouth daily. 30 tablet 5  . HYDROcodone-acetaminophen (NORCO) 5-325 MG tablet Take 1-2 tablets by mouth every 6 (six)  hours as needed for moderate pain or severe pain. 50 tablet 0  . Insulin Detemir (LEVEMIR FLEXPEN) 100 UNIT/ML Pen Inject 5 Units into the skin daily at 10 pm.    . VERAPAMIL HCL ER PO Take 120 mg by mouth daily.    Marland Kitchen dronabinol (MARINOL) 2.5 MG capsule Take 1 capsule (2.5 mg total) by mouth 2 (two) times daily before a meal. (Patient not taking: Reported on 03/16/2016) 60 capsule 0  . ferrous sulfate 325 (65 FE) MG tablet Take 325 mg by mouth daily with breakfast.    . furosemide (LASIX) 80 MG tablet Take 1 tablet (80 mg total) by mouth daily. (Patient not taking: Reported on 03/16/2016) 30 tablet 0  . promethazine (PHENERGAN) 25 MG tablet Take 25 mg by mouth every 6 (six) hours as needed for nausea or vomiting.     No current facility-administered medications for this visit.    Facility-Administered Medications Ordered in Other Visits  Medication Dose Route Frequency Provider Last Rate Last Dose  . heparin lock flush 100 unit/mL  500 Units Intravenous Once Lloyd Huger, MD      . sodium chloride flush (NS) 0.9 % injection 10 mL  10 mL Intravenous Once Lloyd Huger, MD        Vitals:   06/08/16 0927  BP: 120/73  Pulse: 78  Resp: 18  Temp: 97.3 F (36.3 C)   ECOG FS:2 - Symptomatic  General: No acute distress. Eyes: anicteric sclera. Lungs: Clear to auscultation bilaterally. Heart: Regular rate and rhythm. No rubs, murmurs, or gallops. Abdomen: Soft, nontender, nondistended. No organomegaly noted, normoactive bowel sounds. Musculoskeletal: No edema, cyanosis, or clubbing. Neuro: Alert, answering all questions appropriately. Cranial nerves grossly intact. Skin: No rashes or petechiae noted.  Psych: Normal affect.   LAB RESULTS:  CBC    Component Value Date/Time   WBC 3.5 (L) 06/08/2016 0848   RBC 3.04 (L) 06/08/2016 0848   HGB 10.1 (L) 06/08/2016 0848   HGB 11.7 (L) 06/27/2014 1344   HCT 29.8 (L) 06/08/2016 0848   HCT 34.0 (L) 06/27/2014 1344   PLT 187  06/08/2016 0848   PLT 267 06/27/2014 1344   MCV 97.9 06/08/2016 0848   MCV 88 06/27/2014 1344   MCH 33.2 06/08/2016 0848   MCHC 34.0 06/08/2016 0848   RDW 19.2 (H) 06/08/2016 0848   RDW 13.4 06/27/2014 1344   LYMPHSABS 0.5 (L) 06/08/2016 0848   LYMPHSABS 0.9 (L) 06/27/2014 1344   MONOABS 0.6 06/08/2016 0848   MONOABS 0.9 06/27/2014 1344   EOSABS 0.0 06/08/2016 0848   EOSABS 0.2 06/27/2014 1344   BASOSABS 0.0 06/08/2016 0848   BASOSABS 0.1 06/27/2014 1344   BMET    Component Value Date/Time   NA 135 06/08/2016 0848   NA 137 06/27/2014 1344   K 5.0 06/08/2016 0848   K 4.0 06/27/2014 1344   CL 97 (L) 06/08/2016 0848   CL 106 06/27/2014 1344   CO2 23 06/08/2016 0848   CO2 26 06/27/2014 1344   GLUCOSE 217 (H) 06/08/2016 0848   GLUCOSE 83 06/27/2014 1344   BUN 60 (H) 06/08/2016 0848  BUN 27 (H) 06/27/2014 1344   CREATININE 4.29 (H) 06/08/2016 0848   CREATININE 1.36 (H) 06/27/2014 1344   CALCIUM 6.8 (L) 06/08/2016 0848   CALCIUM 9.1 06/27/2014 1344   GFRNONAA 11 (L) 06/08/2016 0848   GFRNONAA 46 (L) 06/27/2014 1344   GFRAA 13 (L) 06/08/2016 0848   GFRAA 53 (L) 06/27/2014 1344   Lab Results  Component Value Date   TOTALPROTELP 6.0 06/08/2016   ALBUMINELP 3.6 06/08/2016   A1GS 0.3 06/08/2016   A2GS 0.9 06/08/2016   BETS 0.7 06/08/2016   GAMS 0.5 06/08/2016   MSPIKE Not Observed 06/08/2016   SPEI Comment 06/08/2016        STUDIES: No results found.  ASSESSMENT: Multiple Myeloma in relapse with multiple bony lytic lesions, end-stage renal disease.   PLAN:    1. Multiple Myeloma in relapse with multiple bony lytic lesions: PET scan results reviewed independently consistent with progression of disease. Patient's kappa free light chains have trended down significantly. Daratumumab does not need to be dose reduced in the setting of end-stage renal disease and dialysis. Hold Xgeva given her history of hypocalcemia. Because of patient's immigration status, she could  not undergo transplant. Proceed with cycle 5 today.  After the patient receives 8 weekly cycles, will switch to treatment every 2 weeks for an additional 8 cycles and then monthly until progression of disease. Return to clinic in 1 week for consideration of cycle 5 of 8 of weekly daratumumab and then in 2 weeks for further evaluation and consideration of cycle 6. 2. Back pain: Continue current narcotics as prescribed.  3. End-stage renal disease: Likely secondary to progression of disease. Continue dialysis on Monday, Wednesdays and Fridays. Has fistula. 4. Anemia: Hemoglobin decreased. Patient does not require transfusion at this time. Continue Procrit at dialysis as needed. 5. Hyperglycemia: Patient's blood glucose has improved since initiating insulin. Continue monitoring and treatment by primary care. Monitor closely with solumedrol as a pre-medication. 6. Thrombocytopenia: Resolved. 7. Depression: Continue Celexa 20 mg daily.  8. Weight Loss: Stable without medication. Patient stopped taking Marinol due to making her "feel funny". She states that she has an improved appetite without the medication. Will continue to monitor. 9. Constipation: Resolved. Take OTC medications if needed. Monitor.  10. Pruritus: Resolved. Patient states Benadryl works, but only for several hours. Continue Atarax as needed. 11. Confusion/hallucinations: MRI brian is negative. 12. Hypocalcemia: Patient received 2 g IV calcium gluconate today.   Interpreter present during entire clinic visit.  Patient expressed understanding and was in agreement with this plan. She also understands that She can call clinic at any time with any questions, concerns, or complaints.    Lloyd Huger, MD 06/12/16 11:10 AM

## 2016-06-08 ENCOUNTER — Inpatient Hospital Stay (HOSPITAL_BASED_OUTPATIENT_CLINIC_OR_DEPARTMENT_OTHER): Payer: Self-pay | Admitting: Oncology

## 2016-06-08 ENCOUNTER — Inpatient Hospital Stay: Payer: Self-pay

## 2016-06-08 ENCOUNTER — Inpatient Hospital Stay: Payer: Self-pay | Attending: Oncology

## 2016-06-08 VITALS — BP 120/73 | HR 78 | Temp 97.3°F | Resp 18 | Wt 89.7 lb

## 2016-06-08 DIAGNOSIS — F419 Anxiety disorder, unspecified: Secondary | ICD-10-CM | POA: Insufficient documentation

## 2016-06-08 DIAGNOSIS — C9000 Multiple myeloma not having achieved remission: Secondary | ICD-10-CM

## 2016-06-08 DIAGNOSIS — Z79899 Other long term (current) drug therapy: Secondary | ICD-10-CM | POA: Insufficient documentation

## 2016-06-08 DIAGNOSIS — E1165 Type 2 diabetes mellitus with hyperglycemia: Secondary | ICD-10-CM | POA: Insufficient documentation

## 2016-06-08 DIAGNOSIS — Z5112 Encounter for antineoplastic immunotherapy: Secondary | ICD-10-CM | POA: Insufficient documentation

## 2016-06-08 DIAGNOSIS — E119 Type 2 diabetes mellitus without complications: Secondary | ICD-10-CM

## 2016-06-08 DIAGNOSIS — Z9221 Personal history of antineoplastic chemotherapy: Secondary | ICD-10-CM | POA: Insufficient documentation

## 2016-06-08 DIAGNOSIS — D649 Anemia, unspecified: Secondary | ICD-10-CM

## 2016-06-08 DIAGNOSIS — M549 Dorsalgia, unspecified: Secondary | ICD-10-CM

## 2016-06-08 DIAGNOSIS — I129 Hypertensive chronic kidney disease with stage 1 through stage 4 chronic kidney disease, or unspecified chronic kidney disease: Secondary | ICD-10-CM

## 2016-06-08 DIAGNOSIS — F329 Major depressive disorder, single episode, unspecified: Secondary | ICD-10-CM

## 2016-06-08 DIAGNOSIS — N186 End stage renal disease: Secondary | ICD-10-CM

## 2016-06-08 DIAGNOSIS — C9002 Multiple myeloma in relapse: Secondary | ICD-10-CM

## 2016-06-08 DIAGNOSIS — Z794 Long term (current) use of insulin: Secondary | ICD-10-CM

## 2016-06-08 DIAGNOSIS — G8929 Other chronic pain: Secondary | ICD-10-CM

## 2016-06-08 DIAGNOSIS — Z8719 Personal history of other diseases of the digestive system: Secondary | ICD-10-CM | POA: Insufficient documentation

## 2016-06-08 DIAGNOSIS — R5383 Other fatigue: Secondary | ICD-10-CM

## 2016-06-08 DIAGNOSIS — R531 Weakness: Secondary | ICD-10-CM

## 2016-06-08 DIAGNOSIS — R112 Nausea with vomiting, unspecified: Secondary | ICD-10-CM | POA: Insufficient documentation

## 2016-06-08 DIAGNOSIS — Z8619 Personal history of other infectious and parasitic diseases: Secondary | ICD-10-CM

## 2016-06-08 DIAGNOSIS — Z8701 Personal history of pneumonia (recurrent): Secondary | ICD-10-CM

## 2016-06-08 DIAGNOSIS — Z992 Dependence on renal dialysis: Secondary | ICD-10-CM | POA: Insufficient documentation

## 2016-06-08 DIAGNOSIS — R630 Anorexia: Secondary | ICD-10-CM

## 2016-06-08 DIAGNOSIS — G629 Polyneuropathy, unspecified: Secondary | ICD-10-CM | POA: Insufficient documentation

## 2016-06-08 DIAGNOSIS — M1990 Unspecified osteoarthritis, unspecified site: Secondary | ICD-10-CM

## 2016-06-08 LAB — CBC WITH DIFFERENTIAL/PLATELET
Basophils Absolute: 0 10*3/uL (ref 0–0.1)
Basophils Relative: 1 %
EOS PCT: 1 %
Eosinophils Absolute: 0 10*3/uL (ref 0–0.7)
HCT: 29.8 % — ABNORMAL LOW (ref 35.0–47.0)
Hemoglobin: 10.1 g/dL — ABNORMAL LOW (ref 12.0–16.0)
LYMPHS ABS: 0.5 10*3/uL — AB (ref 1.0–3.6)
Lymphocytes Relative: 14 %
MCH: 33.2 pg (ref 26.0–34.0)
MCHC: 34 g/dL (ref 32.0–36.0)
MCV: 97.9 fL (ref 80.0–100.0)
MONO ABS: 0.6 10*3/uL (ref 0.2–0.9)
MONOS PCT: 17 %
Neutro Abs: 2.4 10*3/uL (ref 1.4–6.5)
Neutrophils Relative %: 67 %
Platelets: 187 10*3/uL (ref 150–440)
RBC: 3.04 MIL/uL — ABNORMAL LOW (ref 3.80–5.20)
RDW: 19.2 % — AB (ref 11.5–14.5)
WBC: 3.5 10*3/uL — ABNORMAL LOW (ref 3.6–11.0)

## 2016-06-08 LAB — COMPREHENSIVE METABOLIC PANEL
ALT: 12 U/L — AB (ref 14–54)
ANION GAP: 15 (ref 5–15)
AST: 20 U/L (ref 15–41)
Albumin: 3.6 g/dL (ref 3.5–5.0)
Alkaline Phosphatase: 762 U/L — ABNORMAL HIGH (ref 38–126)
BUN: 60 mg/dL — AB (ref 6–20)
CALCIUM: 6.8 mg/dL — AB (ref 8.9–10.3)
CHLORIDE: 97 mmol/L — AB (ref 101–111)
CO2: 23 mmol/L (ref 22–32)
CREATININE: 4.29 mg/dL — AB (ref 0.44–1.00)
GFR, EST AFRICAN AMERICAN: 13 mL/min — AB (ref >60)
GFR, EST NON AFRICAN AMERICAN: 11 mL/min — AB (ref >60)
Glucose, Bld: 217 mg/dL — ABNORMAL HIGH (ref 65–99)
POTASSIUM: 5 mmol/L (ref 3.5–5.1)
Sodium: 135 mmol/L (ref 135–145)
Total Bilirubin: 0.8 mg/dL (ref 0.3–1.2)
Total Protein: 6.6 g/dL (ref 6.5–8.1)

## 2016-06-08 MED ORDER — SODIUM CHLORIDE 0.9 % IV SOLN
Freq: Once | INTRAVENOUS | Status: AC
  Administered 2016-06-08: 10:00:00 via INTRAVENOUS
  Filled 2016-06-08: qty 1000

## 2016-06-08 MED ORDER — SODIUM CHLORIDE 0.9 % IV SOLN
16.0000 mg/kg | Freq: Once | INTRAVENOUS | Status: AC
  Administered 2016-06-08: 720 mg via INTRAVENOUS
  Filled 2016-06-08: qty 36

## 2016-06-08 MED ORDER — PROCHLORPERAZINE MALEATE 10 MG PO TABS
10.0000 mg | ORAL_TABLET | Freq: Once | ORAL | Status: AC
  Administered 2016-06-08: 10 mg via ORAL
  Filled 2016-06-08: qty 1

## 2016-06-08 MED ORDER — HEPARIN SOD (PORK) LOCK FLUSH 100 UNIT/ML IV SOLN
500.0000 [IU] | Freq: Once | INTRAVENOUS | Status: AC
  Administered 2016-06-08: 500 [IU] via INTRAVENOUS
  Filled 2016-06-08: qty 5

## 2016-06-08 MED ORDER — DIPHENHYDRAMINE HCL 25 MG PO CAPS
25.0000 mg | ORAL_CAPSULE | Freq: Once | ORAL | Status: AC
  Administered 2016-06-08: 25 mg via ORAL
  Filled 2016-06-08: qty 1

## 2016-06-08 MED ORDER — SODIUM CHLORIDE 0.9% FLUSH
10.0000 mL | INTRAVENOUS | Status: DC | PRN
  Administered 2016-06-08: 10 mL via INTRAVENOUS
  Filled 2016-06-08: qty 10

## 2016-06-08 MED ORDER — METHYLPREDNISOLONE SODIUM SUCC 125 MG IJ SOLR
125.0000 mg | Freq: Once | INTRAMUSCULAR | Status: AC
  Administered 2016-06-08: 125 mg via INTRAVENOUS
  Filled 2016-06-08: qty 2

## 2016-06-08 MED ORDER — SODIUM CHLORIDE 0.9 % IV SOLN
2.0000 g | Freq: Once | INTRAVENOUS | Status: AC
  Administered 2016-06-08: 2 g via INTRAVENOUS
  Filled 2016-06-08: qty 20

## 2016-06-08 MED ORDER — ACETAMINOPHEN 325 MG PO TABS
650.0000 mg | ORAL_TABLET | Freq: Once | ORAL | Status: AC
  Administered 2016-06-08: 650 mg via ORAL
  Filled 2016-06-08: qty 2

## 2016-06-08 NOTE — Progress Notes (Signed)
Complains of fatigue today.

## 2016-06-09 LAB — IGG, IGA, IGM
IGA: 14 mg/dL — AB (ref 87–352)
IGG (IMMUNOGLOBIN G), SERUM: 471 mg/dL — AB (ref 700–1600)
IGM, SERUM: 11 mg/dL — AB (ref 26–217)

## 2016-06-09 LAB — PROTEIN ELECTROPHORESIS, SERUM
A/G Ratio: 1.5 (ref 0.7–1.7)
ALBUMIN ELP: 3.6 g/dL (ref 2.9–4.4)
ALPHA-1-GLOBULIN: 0.3 g/dL (ref 0.0–0.4)
ALPHA-2-GLOBULIN: 0.9 g/dL (ref 0.4–1.0)
BETA GLOBULIN: 0.7 g/dL (ref 0.7–1.3)
GAMMA GLOBULIN: 0.5 g/dL (ref 0.4–1.8)
Globulin, Total: 2.4 g/dL (ref 2.2–3.9)
Total Protein ELP: 6 g/dL (ref 6.0–8.5)

## 2016-06-09 LAB — KAPPA/LAMBDA LIGHT CHAINS
Kappa free light chain: 81.7 mg/L — ABNORMAL HIGH (ref 3.3–19.4)
Kappa, lambda light chain ratio: 5.56 — ABNORMAL HIGH (ref 0.26–1.65)
Lambda free light chains: 14.7 mg/L (ref 5.7–26.3)

## 2016-06-15 ENCOUNTER — Inpatient Hospital Stay: Payer: Self-pay

## 2016-06-15 VITALS — BP 117/55 | HR 80 | Temp 97.3°F | Resp 20

## 2016-06-15 DIAGNOSIS — C9002 Multiple myeloma in relapse: Secondary | ICD-10-CM

## 2016-06-15 LAB — CBC WITH DIFFERENTIAL/PLATELET
BASOS PCT: 1 %
Basophils Absolute: 0 10*3/uL (ref 0–0.1)
EOS ABS: 0 10*3/uL (ref 0–0.7)
Eosinophils Relative: 0 %
HCT: 32.5 % — ABNORMAL LOW (ref 35.0–47.0)
Hemoglobin: 11 g/dL — ABNORMAL LOW (ref 12.0–16.0)
LYMPHS ABS: 0.5 10*3/uL — AB (ref 1.0–3.6)
Lymphocytes Relative: 11 %
MCH: 33.7 pg (ref 26.0–34.0)
MCHC: 33.8 g/dL (ref 32.0–36.0)
MCV: 99.7 fL (ref 80.0–100.0)
Monocytes Absolute: 0.8 10*3/uL (ref 0.2–0.9)
Monocytes Relative: 19 %
NEUTROS PCT: 69 %
Neutro Abs: 3 10*3/uL (ref 1.4–6.5)
Platelets: 169 10*3/uL (ref 150–440)
RBC: 3.26 MIL/uL — AB (ref 3.80–5.20)
RDW: 19.2 % — ABNORMAL HIGH (ref 11.5–14.5)
WBC: 4.4 10*3/uL (ref 3.6–11.0)

## 2016-06-15 LAB — COMPREHENSIVE METABOLIC PANEL
ALBUMIN: 3.7 g/dL (ref 3.5–5.0)
ALK PHOS: 531 U/L — AB (ref 38–126)
ALT: 13 U/L — AB (ref 14–54)
AST: 22 U/L (ref 15–41)
Anion gap: 13 (ref 5–15)
BUN: 59 mg/dL — ABNORMAL HIGH (ref 6–20)
CALCIUM: 6.5 mg/dL — AB (ref 8.9–10.3)
CO2: 24 mmol/L (ref 22–32)
CREATININE: 4.16 mg/dL — AB (ref 0.44–1.00)
Chloride: 97 mmol/L — ABNORMAL LOW (ref 101–111)
GFR calc Af Amer: 13 mL/min — ABNORMAL LOW (ref >60)
GFR calc non Af Amer: 11 mL/min — ABNORMAL LOW (ref >60)
GLUCOSE: 284 mg/dL — AB (ref 65–99)
Potassium: 5.1 mmol/L (ref 3.5–5.1)
SODIUM: 134 mmol/L — AB (ref 135–145)
Total Bilirubin: 0.7 mg/dL (ref 0.3–1.2)
Total Protein: 6.4 g/dL — ABNORMAL LOW (ref 6.5–8.1)

## 2016-06-15 MED ORDER — SODIUM CHLORIDE 0.9% FLUSH
10.0000 mL | Freq: Once | INTRAVENOUS | Status: AC
  Administered 2016-06-15: 10 mL via INTRAVENOUS
  Filled 2016-06-15: qty 10

## 2016-06-15 MED ORDER — PROCHLORPERAZINE MALEATE 10 MG PO TABS
10.0000 mg | ORAL_TABLET | Freq: Once | ORAL | Status: AC
  Administered 2016-06-15: 10 mg via ORAL
  Filled 2016-06-15: qty 1

## 2016-06-15 MED ORDER — SODIUM CHLORIDE 0.9 % IV SOLN
Freq: Once | INTRAVENOUS | Status: AC
  Administered 2016-06-15: 10:00:00 via INTRAVENOUS
  Filled 2016-06-15: qty 1000

## 2016-06-15 MED ORDER — SODIUM CHLORIDE 0.9% FLUSH
10.0000 mL | INTRAVENOUS | Status: DC | PRN
  Administered 2016-06-15: 10 mL
  Filled 2016-06-15: qty 10

## 2016-06-15 MED ORDER — ACETAMINOPHEN 325 MG PO TABS
650.0000 mg | ORAL_TABLET | Freq: Once | ORAL | Status: AC
  Administered 2016-06-15: 650 mg via ORAL
  Filled 2016-06-15: qty 2

## 2016-06-15 MED ORDER — HEPARIN SOD (PORK) LOCK FLUSH 100 UNIT/ML IV SOLN: 500.0000 [IU] | Freq: Once | INTRAVENOUS | Status: DC | PRN

## 2016-06-15 MED ORDER — SODIUM CHLORIDE 0.9 % IV SOLN
16.0000 mg/kg | Freq: Once | INTRAVENOUS | Status: AC
  Administered 2016-06-15: 720 mg via INTRAVENOUS
  Filled 2016-06-15: qty 36

## 2016-06-15 MED ORDER — METHYLPREDNISOLONE SODIUM SUCC 125 MG IJ SOLR
125.0000 mg | Freq: Once | INTRAMUSCULAR | Status: AC
Start: 2016-06-15 — End: 2016-06-15
  Administered 2016-06-15: 125 mg via INTRAVENOUS
  Filled 2016-06-15: qty 2

## 2016-06-15 MED ORDER — HEPARIN SOD (PORK) LOCK FLUSH 100 UNIT/ML IV SOLN
500.0000 [IU] | Freq: Once | INTRAVENOUS | Status: AC
  Administered 2016-06-15: 500 [IU] via INTRAVENOUS
  Filled 2016-06-15: qty 5

## 2016-06-15 MED ORDER — DIPHENHYDRAMINE HCL 25 MG PO CAPS
25.0000 mg | ORAL_CAPSULE | Freq: Once | ORAL | Status: AC
  Administered 2016-06-15: 25 mg via ORAL
  Filled 2016-06-15: qty 1

## 2016-06-15 MED ORDER — CALCIUM GLUCONATE 10 % IV SOLN
2.0000 g | Freq: Once | INTRAVENOUS | Status: AC
  Administered 2016-06-15: 2 g via INTRAVENOUS
  Filled 2016-06-15: qty 20

## 2016-06-15 NOTE — Progress Notes (Signed)
Calcium noted to be 6.5 today.  Okay to proceed with treatment today per Dr. Mike Gip.  Pt to get 2 grams of calcium gluconate with her treatment today per Dr. Mike Gip.

## 2016-06-16 ENCOUNTER — Other Ambulatory Visit
Admission: RE | Admit: 2016-06-16 | Discharge: 2016-06-16 | Disposition: A | Discharge status: Home / Self Care | Payer: Medicaid Other | Source: Ambulatory Visit | Attending: Nephrology | Admitting: Nephrology

## 2016-06-16 DIAGNOSIS — N186 End stage renal disease: Secondary | ICD-10-CM | POA: Diagnosis present

## 2016-06-16 LAB — KAPPA/LAMBDA LIGHT CHAINS
Kappa free light chain: 82.6 mg/L — ABNORMAL HIGH (ref 3.3–19.4)
Kappa, lambda light chain ratio: 5.9 — ABNORMAL HIGH (ref 0.26–1.65)
Lambda free light chains: 14 mg/L (ref 5.7–26.3)

## 2016-06-16 LAB — IGG, IGA, IGM
IGA: 16 mg/dL — AB (ref 87–352)
IGM, SERUM: 11 mg/dL — AB (ref 26–217)
IgG (Immunoglobin G), Serum: 455 mg/dL — ABNORMAL LOW (ref 700–1600)

## 2016-06-16 LAB — POTASSIUM: Potassium: 6.6 mmol/L (ref 3.5–5.1)

## 2016-06-16 LAB — PROTEIN ELECTROPHORESIS, SERUM
A/G Ratio: 1.9 — ABNORMAL HIGH (ref 0.7–1.7)
Albumin ELP: 3.7 g/dL (ref 2.9–4.4)
Alpha-1-Globulin: 0.2 g/dL (ref 0.0–0.4)
Alpha-2-Globulin: 0.7 g/dL (ref 0.4–1.0)
Beta Globulin: 0.7 g/dL (ref 0.7–1.3)
Gamma Globulin: 0.4 g/dL (ref 0.4–1.8)
Globulin, Total: 1.9 g/dL — ABNORMAL LOW (ref 2.2–3.9)
M-Spike, %: 0.1 g/dL — ABNORMAL HIGH
Total Protein ELP: 5.6 g/dL — ABNORMAL LOW (ref 6.0–8.5)

## 2016-06-20 ENCOUNTER — Inpatient Hospital Stay
Admission: EM | Admit: 2016-06-20 | Discharge: 2016-06-21 | DRG: 640 | Disposition: A | Discharge status: Home / Self Care | Payer: Self-pay | Attending: Internal Medicine | Admitting: Internal Medicine

## 2016-06-20 ENCOUNTER — Encounter: Payer: Self-pay | Admitting: Internal Medicine

## 2016-06-20 DIAGNOSIS — E872 Acidosis: Secondary | ICD-10-CM | POA: Diagnosis present

## 2016-06-20 DIAGNOSIS — F329 Major depressive disorder, single episode, unspecified: Secondary | ICD-10-CM | POA: Diagnosis present

## 2016-06-20 DIAGNOSIS — R1110 Vomiting, unspecified: Secondary | ICD-10-CM

## 2016-06-20 DIAGNOSIS — D631 Anemia in chronic kidney disease: Secondary | ICD-10-CM | POA: Diagnosis present

## 2016-06-20 DIAGNOSIS — C9002 Multiple myeloma in relapse: Secondary | ICD-10-CM | POA: Diagnosis present

## 2016-06-20 DIAGNOSIS — Z992 Dependence on renal dialysis: Secondary | ICD-10-CM

## 2016-06-20 DIAGNOSIS — R001 Bradycardia, unspecified: Secondary | ICD-10-CM | POA: Diagnosis present

## 2016-06-20 DIAGNOSIS — E860 Dehydration: Secondary | ICD-10-CM | POA: Diagnosis present

## 2016-06-20 DIAGNOSIS — E871 Hypo-osmolality and hyponatremia: Secondary | ICD-10-CM | POA: Diagnosis present

## 2016-06-20 DIAGNOSIS — R531 Weakness: Secondary | ICD-10-CM | POA: Diagnosis present

## 2016-06-20 DIAGNOSIS — Z794 Long term (current) use of insulin: Secondary | ICD-10-CM

## 2016-06-20 DIAGNOSIS — N186 End stage renal disease: Secondary | ICD-10-CM | POA: Diagnosis present

## 2016-06-20 DIAGNOSIS — R109 Unspecified abdominal pain: Secondary | ICD-10-CM | POA: Diagnosis present

## 2016-06-20 DIAGNOSIS — N179 Acute kidney failure, unspecified: Secondary | ICD-10-CM | POA: Diagnosis present

## 2016-06-20 DIAGNOSIS — R9431 Abnormal electrocardiogram [ECG] [EKG]: Secondary | ICD-10-CM

## 2016-06-20 DIAGNOSIS — E875 Hyperkalemia: Principal | ICD-10-CM | POA: Diagnosis present

## 2016-06-20 DIAGNOSIS — I959 Hypotension, unspecified: Secondary | ICD-10-CM | POA: Diagnosis present

## 2016-06-20 DIAGNOSIS — N2581 Secondary hyperparathyroidism of renal origin: Secondary | ICD-10-CM | POA: Diagnosis present

## 2016-06-20 DIAGNOSIS — I120 Hypertensive chronic kidney disease with stage 5 chronic kidney disease or end stage renal disease: Secondary | ICD-10-CM | POA: Diagnosis present

## 2016-06-20 DIAGNOSIS — F419 Anxiety disorder, unspecified: Secondary | ICD-10-CM | POA: Diagnosis present

## 2016-06-20 DIAGNOSIS — E1140 Type 2 diabetes mellitus with diabetic neuropathy, unspecified: Secondary | ICD-10-CM | POA: Diagnosis present

## 2016-06-20 DIAGNOSIS — Z9221 Personal history of antineoplastic chemotherapy: Secondary | ICD-10-CM

## 2016-06-20 DIAGNOSIS — G6281 Critical illness polyneuropathy: Secondary | ICD-10-CM | POA: Diagnosis present

## 2016-06-20 DIAGNOSIS — Z79899 Other long term (current) drug therapy: Secondary | ICD-10-CM

## 2016-06-20 DIAGNOSIS — N189 Chronic kidney disease, unspecified: Secondary | ICD-10-CM

## 2016-06-20 DIAGNOSIS — E876 Hypokalemia: Secondary | ICD-10-CM | POA: Diagnosis present

## 2016-06-20 DIAGNOSIS — R112 Nausea with vomiting, unspecified: Secondary | ICD-10-CM | POA: Diagnosis present

## 2016-06-20 DIAGNOSIS — E1122 Type 2 diabetes mellitus with diabetic chronic kidney disease: Secondary | ICD-10-CM | POA: Diagnosis present

## 2016-06-20 DIAGNOSIS — M1990 Unspecified osteoarthritis, unspecified site: Secondary | ICD-10-CM | POA: Diagnosis present

## 2016-06-20 LAB — TYPE AND SCREEN
ABO/RH(D): B POS
ANTIBODY SCREEN: POSITIVE

## 2016-06-20 LAB — RENAL FUNCTION PANEL
ANION GAP: 13 (ref 5–15)
Albumin: 3.2 g/dL — ABNORMAL LOW (ref 3.5–5.0)
BUN: 87 mg/dL — AB (ref 6–20)
CO2: 22 mmol/L (ref 22–32)
Calcium: 5.5 mg/dL — CL (ref 8.9–10.3)
Chloride: 99 mmol/L — ABNORMAL LOW (ref 101–111)
Creatinine, Ser: 5.26 mg/dL — ABNORMAL HIGH (ref 0.44–1.00)
GFR calc Af Amer: 10 mL/min — ABNORMAL LOW (ref >60)
GFR calc non Af Amer: 9 mL/min — ABNORMAL LOW (ref >60)
GLUCOSE: 103 mg/dL — AB (ref 65–99)
POTASSIUM: 7.1 mmol/L — AB (ref 3.5–5.1)
Phosphorus: 6.2 mg/dL — ABNORMAL HIGH (ref 2.5–4.6)
SODIUM: 134 mmol/L — AB (ref 135–145)

## 2016-06-20 LAB — COMPREHENSIVE METABOLIC PANEL
ALT: 14 U/L (ref 14–54)
ANION GAP: 19 — AB (ref 5–15)
AST: 23 U/L (ref 15–41)
Albumin: 3.9 g/dL (ref 3.5–5.0)
Alkaline Phosphatase: 362 U/L — ABNORMAL HIGH (ref 38–126)
BILIRUBIN TOTAL: 0.8 mg/dL (ref 0.3–1.2)
BUN: 103 mg/dL — ABNORMAL HIGH (ref 6–20)
CHLORIDE: 88 mmol/L — AB (ref 101–111)
CO2: 22 mmol/L (ref 22–32)
Calcium: 5.5 mg/dL — CL (ref 8.9–10.3)
Creatinine, Ser: 6.53 mg/dL — ABNORMAL HIGH (ref 0.44–1.00)
GFR calc Af Amer: 8 mL/min — ABNORMAL LOW (ref >60)
GFR, EST NON AFRICAN AMERICAN: 7 mL/min — AB (ref >60)
Glucose, Bld: 211 mg/dL — ABNORMAL HIGH (ref 65–99)
Sodium: 129 mmol/L — ABNORMAL LOW (ref 135–145)
Total Protein: 6.5 g/dL (ref 6.5–8.1)

## 2016-06-20 LAB — CBC WITH DIFFERENTIAL/PLATELET
Basophils Absolute: 0 10*3/uL (ref 0–0.1)
Basophils Relative: 0 %
Eosinophils Absolute: 0 10*3/uL (ref 0–0.7)
Eosinophils Relative: 0 %
HEMATOCRIT: 34.4 % — AB (ref 35.0–47.0)
HEMOGLOBIN: 11.4 g/dL — AB (ref 12.0–16.0)
Lymphocytes Relative: 5 %
Lymphs Abs: 0.3 10*3/uL — ABNORMAL LOW (ref 1.0–3.6)
MCH: 33.1 pg (ref 26.0–34.0)
MCHC: 33 g/dL (ref 32.0–36.0)
MCV: 100.2 fL — AB (ref 80.0–100.0)
MONO ABS: 0.4 10*3/uL (ref 0.2–0.9)
MONOS PCT: 6 %
NEUTROS PCT: 89 %
Neutro Abs: 6.4 10*3/uL (ref 1.4–6.5)
Platelets: 172 10*3/uL (ref 150–440)
RBC: 3.43 MIL/uL — ABNORMAL LOW (ref 3.80–5.20)
RDW: 19 % — AB (ref 11.5–14.5)
WBC: 7.2 10*3/uL (ref 3.6–11.0)

## 2016-06-20 LAB — MAGNESIUM: Magnesium: 2.5 mg/dL — ABNORMAL HIGH (ref 1.7–2.4)

## 2016-06-20 LAB — TROPONIN I: Troponin I: <0.03 ng/mL (ref <0.03)

## 2016-06-20 LAB — GLUCOSE, CAPILLARY
GLUCOSE-CAPILLARY: 210 mg/dL — AB (ref 65–99)
GLUCOSE-CAPILLARY: 90 mg/dL (ref 65–99)
Glucose-Capillary: 227 mg/dL — ABNORMAL HIGH (ref 65–99)
Glucose-Capillary: 70 mg/dL (ref 65–99)

## 2016-06-20 LAB — LIPASE, BLOOD: LIPASE: 137 U/L — AB (ref 11–51)

## 2016-06-20 LAB — LACTIC ACID, PLASMA: LACTIC ACID, VENOUS: 2.3 mmol/L — AB (ref 0.5–1.9)

## 2016-06-20 LAB — MRSA PCR SCREENING: MRSA BY PCR: NEGATIVE

## 2016-06-20 MED ORDER — INSULIN ASPART 100 UNIT/ML ~~LOC~~ SOLN
0.0000 [IU] | Freq: Three times a day (TID) | SUBCUTANEOUS | Status: DC
  Administered 2016-06-21: 1 [IU] via SUBCUTANEOUS
  Administered 2016-06-21: 3 [IU] via SUBCUTANEOUS
  Filled 2016-06-20: qty 1
  Filled 2016-06-20: qty 3

## 2016-06-20 MED ORDER — SODIUM CHLORIDE 0.9 % IV BOLUS (SEPSIS)
1000.0000 mL | Freq: Once | INTRAVENOUS | Status: AC
  Administered 2016-06-20: 1000 mL via INTRAVENOUS

## 2016-06-20 MED ORDER — HEPARIN SODIUM (PORCINE) 5000 UNIT/ML IJ SOLN
5000.0000 [IU] | Freq: Three times a day (TID) | INTRAMUSCULAR | Status: DC
  Administered 2016-06-21 (×3): 5000 [IU] via SUBCUTANEOUS
  Filled 2016-06-20 (×3): qty 1

## 2016-06-20 MED ORDER — CITALOPRAM HYDROBROMIDE 20 MG PO TABS
20.0000 mg | ORAL_TABLET | Freq: Every day | ORAL | Status: DC
  Administered 2016-06-21: 20 mg via ORAL
  Filled 2016-06-20: qty 1

## 2016-06-20 MED ORDER — ONDANSETRON HCL 4 MG/2ML IJ SOLN
4.0000 mg | Freq: Once | INTRAMUSCULAR | Status: AC
  Administered 2016-06-20: 4 mg via INTRAVENOUS
  Filled 2016-06-20: qty 2

## 2016-06-20 MED ORDER — CEFTRIAXONE SODIUM-DEXTROSE 1-3.74 GM-% IV SOLR
1.0000 g | Freq: Once | INTRAVENOUS | Status: DC
  Filled 2016-06-20: qty 50

## 2016-06-20 MED ORDER — ONDANSETRON HCL 4 MG PO TABS
4.0000 mg | ORAL_TABLET | Freq: Four times a day (QID) | ORAL | Status: DC | PRN
Start: 2016-06-20 — End: 2016-06-21

## 2016-06-20 MED ORDER — DEXTROSE 5 % IV SOLN
2.0000 g | Freq: Once | INTRAVENOUS | Status: AC
  Administered 2016-06-21: 2 g via INTRAVENOUS
  Filled 2016-06-20 (×2): qty 2

## 2016-06-20 MED ORDER — BISACODYL 10 MG RE SUPP: 10.0000 mg | Freq: Every day | RECTAL | Status: DC | PRN

## 2016-06-20 MED ORDER — VERAPAMIL HCL ER 120 MG PO TBCR
120.0000 mg | EXTENDED_RELEASE_TABLET | Freq: Every day | ORAL | Status: DC
  Administered 2016-06-21: 120 mg via ORAL
  Filled 2016-06-20: qty 1

## 2016-06-20 MED ORDER — CEFTRIAXONE SODIUM-DEXTROSE 2-2.22 GM-% IV SOLR
2.0000 g | INTRAVENOUS | Status: DC
  Filled 2016-06-20: qty 50

## 2016-06-20 MED ORDER — SODIUM CHLORIDE 0.9 % IV SOLN
1.0000 g | Freq: Once | INTRAVENOUS | Status: AC
  Administered 2016-06-20: 1 g via INTRAVENOUS
  Filled 2016-06-20: qty 10

## 2016-06-20 MED ORDER — DIPHENOXYLATE-ATROPINE 2.5-0.025 MG PO TABS: 1.0000 | ORAL_TABLET | Freq: Four times a day (QID) | ORAL | Status: DC | PRN

## 2016-06-20 MED ORDER — CEFTRIAXONE SODIUM-DEXTROSE 1-3.74 GM-% IV SOLR: 1.0000 g | Freq: Once | INTRAVENOUS | Status: DC

## 2016-06-20 MED ORDER — CEFTRIAXONE SODIUM-DEXTROSE 2-2.22 GM-% IV SOLR: 2.0000 g | Freq: Once | INTRAVENOUS | Status: DC

## 2016-06-20 MED ORDER — INSULIN ASPART 100 UNIT/ML ~~LOC~~ SOLN
8.0000 [IU] | Freq: Once | SUBCUTANEOUS | Status: AC
  Administered 2016-06-20: 8 [IU] via INTRAVENOUS
  Filled 2016-06-20: qty 8

## 2016-06-20 MED ORDER — DEXTROSE 5 % IV SOLN: 1.0000 g | INTRAVENOUS | Status: DC

## 2016-06-20 MED ORDER — ONDANSETRON HCL 4 MG/2ML IJ SOLN
4.0000 mg | Freq: Four times a day (QID) | INTRAMUSCULAR | Status: DC | PRN
  Administered 2016-06-21: 4 mg via INTRAVENOUS
  Filled 2016-06-20: qty 2

## 2016-06-20 MED ORDER — DEXTROSE 50 % IV SOLN
25.0000 g | Freq: Once | INTRAVENOUS | Status: AC
  Administered 2016-06-20: 25 g via INTRAVENOUS
  Filled 2016-06-20: qty 50

## 2016-06-20 MED ORDER — INSULIN DETEMIR 100 UNIT/ML ~~LOC~~ SOLN
5.0000 [IU] | Freq: Every day | SUBCUTANEOUS | Status: DC
  Filled 2016-06-20 (×2): qty 0.05

## 2016-06-20 MED ORDER — DOCUSATE SODIUM 100 MG PO CAPS
100.0000 mg | ORAL_CAPSULE | Freq: Two times a day (BID) | ORAL | Status: DC
  Administered 2016-06-21: 100 mg via ORAL
  Filled 2016-06-20: qty 1

## 2016-06-20 MED ORDER — SODIUM CHLORIDE 0.9% FLUSH
3.0000 mL | Freq: Two times a day (BID) | INTRAVENOUS | Status: DC
  Administered 2016-06-20 – 2016-06-21 (×2): 3 mL via INTRAVENOUS

## 2016-06-20 MED ORDER — FAMOTIDINE IN NACL 20-0.9 MG/50ML-% IV SOLN
20.0000 mg | Freq: Every day | INTRAVENOUS | Status: DC
Start: 2016-06-20 — End: 2016-06-21
  Administered 2016-06-21: 20 mg via INTRAVENOUS
  Filled 2016-06-20: qty 50

## 2016-06-20 MED ORDER — SODIUM CHLORIDE 0.9 % IV SOLN: INTRAVENOUS | Status: DC

## 2016-06-20 NOTE — Progress Notes (Signed)
Consulted at 17:32 for hyperkalemia. Emergent hemodialysis is recommended. Have discussed case with Emergency Room physician. Hemodialysis nurse is on site and prepared to initiate treatment.   Lavonia Dana 7:13 PM  06/20/16

## 2016-06-20 NOTE — Progress Notes (Signed)
HD STARTED  

## 2016-06-20 NOTE — Progress Notes (Signed)
[facility]  ROUNDING NOTE   Subjective:   Ms. Makela Bonnin admitted to [facility] on 06/20/2016 for Hypocalcemia [E83.51] Hyperkalemia [E87.5] Weakness [R53.1] Non-intractable vomiting with nausea, unspecified vomiting type [R11.2]   Patient's potassium >7.5. Sinus bradycardia. She complains of nausea/vomiting and poor PO intake.  Nephrology consulted for emergent hemodialysis.   Objective:  Vital signs in last 24 hours:  Temp:  [97.1 F (36.2 C)] 97.1 F (36.2 C) (04/15 1619) Pulse Rate:  [47-98] 47 (04/15 1615) Resp:  [28] 28 (04/15 1615) BP: (85-135)/(44-89) 124/84 (04/15 1930) SpO2:  [97 %-100 %] 97 % (04/15 1730) Weight:  [40.4 kg (89 lb)] 40.4 kg (89 lb) (04/15 1603)  Weight change:  Filed Weights   06/20/16 1603  Weight: 40.4 kg (89 lb)    Intake/Output: I/O last 3 completed shifts: In: 1100 [IV Piggyback:1100] Out: -    Intake/Output this shift:  Total I/O In: 1000 [IV Piggyback:1000] Out: -   Physical Exam: General: Critically ill  Head: Normocephalic, atraumatic. Moist oral mucosal membranes  Eyes: Anicteric, PERRL  Neck: Supple, trachea midline  Lungs:  Clear to auscultation  Heart: Regular rate and rhythm  Abdomen:  Soft, nontender,   Extremities: no peripheral edema.  Neurologic: Nonfocal, moving all four extremities  Skin: No lesions  Access: Left arm AVF    Basic Metabolic Panel:  Recent Labs Lab 06/15/16 0837 06/16/16 1154 06/20/16 1616 06/20/16 1629  NA 134*  --  129*  --   K 5.1 6.6* >7.5*  --   CL 97*  --  88*  --   CO2 24  --  22  --   GLUCOSE 284*  --  211*  --   BUN 59*  --  103*  --   CREATININE 4.16*  --  6.53*  --   CALCIUM 6.5*  --  5.5*  --   MG  --   --   --  2.5*    Liver Function Tests:  Recent Labs Lab 06/15/16 0837 06/20/16 1616  AST 22 23  ALT 13* 14  ALKPHOS 531* 362*  BILITOT 0.7 0.8  PROT 6.4* 6.5  ALBUMIN 3.7 3.9    Recent Labs Lab 06/20/16 1616  LIPASE 137*   No results  for input(s): AMMONIA in the last 168 hours.  CBC:  Recent Labs Lab 06/15/16 0837 06/20/16 1620  WBC 4.4 7.2  NEUTROABS 3.0 6.4  HGB 11.0* 11.4*  HCT 32.5* 34.4*  MCV 99.7 100.2*  PLT 169 172    Cardiac Enzymes:  Recent Labs Lab 06/20/16 1620  TROPONINI <0.03    BNP: Invalid input(s): POCBNP  CBG:  Recent Labs Lab 06/20/16 1728 06/20/16 1817 06/20/16 2001  GLUCAP 210* 227* 90    Microbiology: Results for orders placed or performed during the hospital encounter of 05/25/16  Culture, blood (routine x 2)     Status: None   Collection Time: 05/25/16  3:31 PM  Result Value Ref Range Status   Specimen Description BLOOD PORT  Final   Special Requests BOTTLES DRAWN AEROBIC AND ANAEROBIC BCAV  Final   Culture NO GROWTH 5 DAYS  Final   Report Status 05/30/2016 FINAL  Final  Culture, blood (routine x 2)     Status: None   Collection Time: 05/25/16  3:31 PM  Result Value Ref Range Status   Specimen Description BLOOD RIGHT AC  Final   Special Requests BOTTLES DRAWN AEROBIC AND ANAEROBIC BCAV  Final   Culture NO GROWTH 5   DAYS  Final   Report Status 05/30/2016 FINAL  Final  Urine culture     Status: Abnormal   Collection Time: 05/25/16  3:31 PM  Result Value Ref Range Status   Specimen Description URINE, RANDOM  Final   Special Requests NONE  Final   Culture MULTIPLE SPECIES PRESENT, SUGGEST RECOLLECTION (A)  Final   Report Status 05/26/2016 FINAL  Final  MRSA PCR Screening     Status: None   Collection Time: 05/25/16  8:22 PM  Result Value Ref Range Status   MRSA by PCR NEGATIVE NEGATIVE Final    Comment:        The GeneXpert MRSA Assay (FDA approved for NASAL specimens only), is one component of a comprehensive MRSA colonization surveillance program. It is not intended to diagnose MRSA infection nor to guide or monitor treatment for MRSA infections.     Coagulation Studies: No results for input(s): LABPROT, INR in the last 72 hours.  Urinalysis: No  results for input(s): COLORURINE, LABSPEC, PHURINE, GLUCOSEU, HGBUR, BILIRUBINUR, KETONESUR, PROTEINUR, UROBILINOGEN, NITRITE, LEUKOCYTESUR in the last 72 hours.  Invalid input(s): APPERANCEUR    Imaging: No results found.   Medications:   . sodium chloride     . cefTRIAXone (ROCEPHIN) 1-2 GM IVPB  2 g Intravenous Once  . [START ON 06/21/2016] cefTRIAXone  2 g Intravenous Q24H  . [START ON 06/21/2016] citalopram  20 mg Oral Daily  . docusate sodium  100 mg Oral BID  . famotidine (PEPCID) IV  20 mg Intravenous Daily  . heparin  5,000 Units Subcutaneous Q8H  . [START ON 06/21/2016] insulin aspart  0-9 Units Subcutaneous TID WC  . insulin detemir  5 Units Subcutaneous Q2200  . sodium chloride flush  3 mL Intravenous Q12H  . verapamil  120 mg Oral Daily   bisacodyl, diphenoxylate-atropine, ondansetron **OR** ondansetron (ZOFRAN) IV  Assessment/ Plan:  Ms. Khadijah Agro is a 51 y.o. Hispanic female Spanish speaking only with diabetes mellitus type II, hypertension, multiple myeloma, ESRD on hemodialysis.   [facility] MWF [address]    1. End Stage Renal Disease: with hyperkalemia:  ESRD secondary to multiple myeloma Emergent hemodialysis tonight. Orders prepared. 2K bath.  - resume MWF schedule.   2.  Anemia of chronic kidney disease: hemoglobin 11.4  3. Hypertension: hypotensive initially.  - verapamil ordered.   4. Secondary Hyperparathyroidism with hypocalcemia: outpatient PTH 968, outpatient calcium 7.1  Phosphorus at goal.  - outpatient binder is Auryxia.  5. Multiple myeloma on chemotherapy: last chemotherapy was Tuesday 4/10 - Followed by Dr. Finnegan.    LOS: 0 KOLLURU, SARATH 4/15/20188:55 PM  

## 2016-06-20 NOTE — Consult Note (Signed)
PULMONARY / CRITICAL CARE MEDICINE   Name: Jamie Keller MRN: [id number] DOB: 28-Oct-1965    ADMISSION DATE:  06/20/2016   CONSULTATION DATE:  06/20/2016  REFERRING MD:  Dr. Fulton Reek  REASON FOR CONSULT: ICU management of hyperkalemia with EKG changes and need for emergent he,odialysis  CHIEF COMPLAINT:  Nausea and vomiting  HISTORY OF PRESENT ILLNESS:   This is a 51 y/o female with ESRD on HD M-W-F, type 2 DM, hypertension, and  multiple myeloma who presented to the ED following tow days of nausea and vomiting. Emesis is mostly clear liquids and bile. Patient reports extreme weakness, diffuse abdominal pain, blurred vision and lightheadedness. Mild relief with antiepileptics. She is still receiving chemotherapy for her multiple myeloma and her last session was on Tuesday, April 10. At the ED, patient was hypotensive with systolic blood pressures in the 80s and was also diaphoretic. Her blood pressure improved and she subsequently became severely hypertensive. Her labs showed a potassium level of >7 with subtle EKG changes. She is being admitted to the ICU for emergent HD.   PAST MEDICAL HISTORY :  She  has a past medical history of Anemia; Anxiety; Chronic kidney disease; Depression; Diabetes mellitus without complication ([place]); Dialysis patient [facility]); History of chemotherapy; Hypertension; Multiple myeloma ([place]); Neuropathy associated with cancer ([place]); Osteoarthritis; Pancreatitis; Pneumonia; Post-menopausal (2014); and Sepsis ([place]).  PAST SURGICAL HISTORY: She  has a past surgical history that includes Cesarean section; Cardiac catheterization (N/A, 11/19/2015); Portacath placement; dialysis catheter placement; Tubal ligation; AV fistula placement (Left, 02/20/2016); Cardiac catheterization (Left, 03/10/2016); and Cardiac catheterization (N/A, 03/31/2016).  No Known Allergies  Current Facility-Administered Medications on File Prior to Encounter  Medication  . LORazepam  (ATIVAN) injection 1 mg   Current Outpatient Prescriptions on File Prior to Encounter  Medication Sig  . citalopram (CELEXA) 20 MG tablet Take 1 tablet (20 mg total) by mouth daily.  . Insulin Detemir (LEVEMIR FLEXPEN) 100 UNIT/ML Pen Inject 5 Units into the skin daily at 10 pm.  . Oxycodone HCl 10 MG TABS Take 1 tablet (10 mg total) by mouth every 4 (four) hours as needed (take 1 tablet by mouth every 4 - 6 hours as needed for severe pain).  . prochlorperazine (COMPAZINE) 10 MG tablet Take 10 mg by mouth every 6 (six) hours as needed for nausea or vomiting.  . verapamil (CALAN-SR) 120 MG CR tablet Take 120 mg by mouth daily.  . Insulin Pen Needle (NOVOFINE) 30G X 8 MM MISC Inject 10 each into the skin as needed.    FAMILY HISTORY:  Her indicated that her mother is alive. She indicated that her father is alive.    SOCIAL HISTORY: She  reports that she has never smoked. She has never used smokeless tobacco. She reports that she does not drink alcohol or use drugs.  REVIEW OF SYSTEMS:   Constitutional: Negative for fever and chills.  HENT: Negative for congestion and rhinorrhea.  Eyes: Negative for redness and visual disturbance.  Respiratory: Negative for shortness of breath and wheezing.  Cardiovascular: Negative for chest pain and palpitations.  Gastrointestinal: Positive for nausea , vomiting and abdominal pain, but negative for loose stools Genitourinary: Negative for dysuria and urgency.  Endocrine: Denies polyuria, polyphagia and heat intolerance Musculoskeletal: Positive for generalized weakness Skin: Negative for pallor and wound.  Neurological: Positive for dizziness and lightheadedness but negative for headaches   SUBJECTIVE:    VITAL SIGNS: BP 124/84   Pulse (!) 47   Temp 97.1 F (  36.2 C) (Rectal)   Resp (!) 28   Wt 89 lb (40.4 kg)   LMP 02/25/1995 Comment: Tubal ligation 1996  SpO2 97%   BMI 17.38 kg/m   HEMODYNAMICS:    VENTILATOR SETTINGS:    INTAKE  / OUTPUT: I/O last 3 completed shifts: In: 1100 [IV Piggyback:1100] Out: -   PHYSICAL EXAMINATION: General:  Acutely ill-looking female, in mild-to-moderate distress Neuro:  Alert and oriented 4, no focal deficits HEENT: PERRLA, trachea midline, oral mucosa dry Cardiovascular: Rate and rhythm regular, S1, S2 audible, no murmur reculture gallop, +2 pulses bilaterally, no edema Lungs:  Clear to auscultation bilaterally Abdomen: Nondistended, normal bowel sounds in all 4 quadrants, palpation reveals no organomegaly and no tenderness Musculoskeletal: Positive range of motion in upper and lower extremities, no joint swelling Skin: Pale, skin is cold to touch  LABS:  BMET  Recent Labs Lab 06/15/16 0837 06/16/16 1154 06/20/16 1616  NA 134*  --  129*  K 5.1 6.6* >7.5*  CL 97*  --  88*  CO2 24  --  22  BUN 59*  --  103*  CREATININE 4.16*  --  6.53*  GLUCOSE 284*  --  211*    Electrolytes  Recent Labs Lab 06/15/16 0837 06/20/16 1616 06/20/16 1629  CALCIUM 6.5* 5.5*  --   MG  --   --  2.5*    CBC  Recent Labs Lab 06/15/16 0837 06/20/16 1620  WBC 4.4 7.2  HGB 11.0* 11.4*  HCT 32.5* 34.4*  PLT 169 172    Coag's No results for input(s): APTT, INR in the last 168 hours.  Sepsis Markers  Recent Labs Lab 06/20/16 1641  LATICACIDVEN 2.3*    ABG No results for input(s): PHART, PCO2ART, PO2ART in the last 168 hours.  Liver Enzymes  Recent Labs Lab 06/15/16 0837 06/20/16 1616  AST 22 23  ALT 13* 14  ALKPHOS 531* 362*  BILITOT 0.7 0.8  ALBUMIN 3.7 3.9    Cardiac Enzymes  Recent Labs Lab 06/20/16 1620  TROPONINI <0.03    Glucose  Recent Labs Lab 06/20/16 1728 06/20/16 1817  GLUCAP 210* 227*    Imaging No results found.  STUDIES:  None  CULTURES: Blood cultures x 2  ANTIBIOTICS: Ceftriaxone 4/15>  SIGNIFICANT EVENTS: 04/15> ED with nausea and vomiting, admitted with severe hypokalemia  LINES/TUBES: Peripheral IVs Left  hemodialysis shunt  DISCUSSION: This is a 51 year old Hispanic female presenting with intractable nausea and vomiting, severe hypokalemia, hypocalcemia and multiple myeloma on chemotherapy.'s. Patient's symptoms are most likely related to her multiple myeloma treatment. She is on a Monday, Wednesday, Friday hemodialysis schedule and reports being compliant with all her medications and dialysis schedule. She is much better post emergent dialysis, but she is still having episodes of severe nausea. Her WBC is normal  hence, I doubt any bacterial GI etiology  ASSESSMENT  Intractable nausea and vomiting Severe hyperkalemia End-stage renal disease on hemodialysis. Multiple Myeloma in relapse with multiple bony lytic lesions Anemia of chronic disease-current hemoglobin 11.1 g/dL  Type 2 diabetes mellitus History of Chronic back pain History of depression Depression  Hypocalcemia Mild lactic acidosis  PLAN Zofran and Reglan when necessary for nausea and vomiting. Hemodialysis per nephrology Repeat potassium level postdialysis. Oncology consulted. Trend hemoglobin and hematocrit Glucose monitoring with sliding-scale insulin coverage per ICU protocol Clear liquids for now and progress diet as tolerated Pain management with when necessary Tylenol and dilaudid  No indication for any abdominal imaging at this point Monitor  and correct electrolyte imbalances GI and DVT prophylaxis  FAMILY  - Updates: No family at bedside. Patient updated on current treatment plan  - Inter-disciplinary family meet or Palliative Care meeting due by: day 7  And of care discussed with a Link physician and ICU attending  [facility] ANP-BC Pulmonary and Critical Care Medicine [facility] Pager [phone] or [phone] 06/20/2016, 7:47 PM   Merton Border, MD PCCM service Mobile [phone] Pager [phone] 06/21/2016

## 2016-06-20 NOTE — ED Provider Notes (Signed)
[facility] Emergency Department Provider Note  Time seen: 4:21 PM  I have reviewed the triage vital signs and the nursing notes.   HISTORY  Chief Complaint Emesis    HPI Jamie Keller is a 51 y.o. female with a past medical history of anxiety, end-stage renal disease on dialysis, diabetes, hypertension, multiple myeloma on chemotherapy, who presents to the emergency department for vomiting. According to the patient since this morning she has felt extremely weak with blurred/dark vision at times, lightheadedness with nausea and vomiting. Patient states she has not been able to tolerate fluids. Patient's last chemotherapy was on Tuesday. Patient states mild diffuse abdominal pain but no focal pain. Denies fever cough congestion and chest pain or shortness of breath. Upon arrival patient is moderately diaphoretic and hypotensive with a systolic blood pressure in the 80s.  Past Medical History:  Diagnosis Date  . Anemia   . Anxiety   . Chronic kidney disease    DIALYSIS  . Depression   . Diabetes mellitus without complication ([place])   . Dialysis patient ([place])    Mon, Wed, Fri  . History of chemotherapy    chemo on Tuesday and Wednesday  . Hypertension   . Multiple myeloma ([place])   . Neuropathy associated with cancer ([place])   . Osteoarthritis   . Pancreatitis   . Pneumonia    September 2017, [facility]  . Post-menopausal 2014  . Sepsis [facility])     Patient Active Problem List   Diagnosis Date Noted  . Respiratory failure ([place]) 05/25/2016  . Respiratory distress   . Multiple myeloma in relapse ([place]) 05/01/2016  . Influenza B 04/02/2016  . Pulmonary edema 04/01/2016  . Hyperkalemia 04/01/2016  . Epigastric pain   . Atypical pneumonia 12/26/2015  . Immunocompromised ([place]) 12/26/2015  . Sepsis ([place]) 12/01/2015  . HCAP (healthcare-associated pneumonia) 12/01/2015  . End stage renal failure on dialysis ([place]) 12/01/2015  . HTN (hypertension) 12/01/2015   . Depression 12/01/2015  . Acute respiratory failure ([place]) 11/14/2015  . Acute renal failure ([place])   . Multiple myeloma not having achieved remission ([place]) 09/06/2015  . Post-menopausal   . Bulging lumbar disc 05/28/2014    Past Surgical History:  Procedure Laterality Date  . AV FISTULA PLACEMENT Left 02/20/2016   Procedure: INSERTION OF ARTERIOVENOUS (AV) GORE-TEX GRAFT ARM;  Surgeon: Katha Cabal, MD;  Location: [facility] ORS;  Service: Vascular;  Laterality: Left;  . CESAREAN SECTION     x2  . dialysis catheter placement    . PERIPHERAL VASCULAR CATHETERIZATION N/A 11/19/2015   Procedure: Dialysis/Perma Catheter Insertion;  Surgeon: Algernon Huxley, MD;  Location: [place] CV LAB;  Service: Cardiovascular;  Laterality: N/A;  . PERIPHERAL VASCULAR CATHETERIZATION Left 03/10/2016   Procedure: Thrombectomy;  Surgeon: Katha Cabal, MD;  Location: [place] CV LAB;  Service: Cardiovascular;  Laterality: Left;  . PERIPHERAL VASCULAR CATHETERIZATION N/A 03/31/2016   Procedure: Dialysis/Perma Catheter Removal;  Surgeon: Katha Cabal, MD;  Location: [place] CV LAB;  Service: Cardiovascular;  Laterality: N/A;  . PORTACATH PLACEMENT    . TUBAL LIGATION      Prior to Admission medications   Medication Sig Start Date End Date Taking? Authorizing Provider  citalopram (CELEXA) 20 MG tablet Take 1 tablet (20 mg total) by mouth daily. 04/13/16   Lloyd Huger, MD  Insulin Detemir (LEVEMIR FLEXPEN) 100 UNIT/ML Pen Inject 5 Units into the skin daily at 10 pm.    Historical Provider, MD  Insulin Pen Needle (NOVOFINE) 30G X 8 MM MISC Inject 10 each into the skin as needed. 04/02/16   Hillary Bow, MD  Oxycodone HCl 10 MG TABS Take 1 tablet (10 mg total) by mouth every 4 (four) hours as needed (take 1 tablet by mouth every 4 - 6 hours as needed for severe pain). 05/18/16   Lloyd Huger, MD  prochlorperazine (COMPAZINE) 10 MG tablet Take 10 mg by mouth every 6 (six) hours as  needed for nausea or vomiting.    Historical Provider, MD  verapamil (CALAN-SR) 120 MG CR tablet Take 120 mg by mouth daily.    Historical Provider, MD    No Known Allergies  Family History  Problem Relation Age of Onset  . Hypercholesterolemia Mother   . Hypertension Mother   . Hypertension Father     Social History Social History  Substance Use Topics  . Smoking status: Never Smoker  . Smokeless tobacco: Never Used  . Alcohol use No    Review of Systems Constitutional: Negative for fever. Cardiovascular: Negative for chest pain. Respiratory: Negative for shortness of breath. Gastrointestinal: Mild diffuse abdominal pain. Positive for nausea and vomiting. Negative for diarrhea. Genitourinary: Negative for dysuria Neurological: Negative for headache 10-point ROS otherwise negative.  ____________________________________________   PHYSICAL EXAM:  VITAL SIGNS: ED Triage Vitals  Enc Vitals Group     BP --      Pulse Rate 06/20/16 1607 98     Resp --      Temp 06/20/16 1619 97.1 F (36.2 C)     Temp Source 06/20/16 1619 Rectal     SpO2 --      Weight 06/20/16 1603 89 lb (40.4 kg)     Height --      Head Circumference --      Peak Flow --      Pain Score 06/20/16 1602 0     Pain Loc --      Pain Edu? --      Excl. in [place]? --     Constitutional: Alert and oriented. Mild to moderate diaphoresis, somewhat pale appearing. Patient answers questions appropriate and follows all commands. Eyes: Normal exam ENT   Head: Normocephalic and atraumatic.   Mouth/Throat: Mucous membranes are moist. Cardiovascular: Normal rate, regular rhythm. No murmur Respiratory: Normal respiratory effort without tachypnea nor retractions. Breath sounds are clear  Gastrointestinal: Soft and nontender. No distention.   Musculoskeletal: Nontender with normal range of motion in all extremities.  Neurologic:  Normal speech and language. No gross focal neurologic deficits  Skin:  Skin is  warm, dry and intact.  Psychiatric: Mood and affect are normal.   ____________________________________________    EKG  EKG reviewed and interpreted by myself shows a junctional rhythm at 49 bpm, narrow QRS, normal axis, prolonged QTC, no concerning ST changes.  ____________________________________________    INITIAL IMPRESSION / ASSESSMENT AND PLAN / ED COURSE  Pertinent labs & imaging results that were available during my care of the patient were reviewed by me and considered in my medical decision making (see chart for details).  The patient presents to the emergency department with nausea vomiting and generalized weakness. Patient found to be hypotensive. Patient has multiple medical problems including end-stage renal disease on hemodialysis Monday, Wednesday, Friday, multiple myeloma on chemotherapy last dose was Tuesday. Currently the patient is hypotensive. We will dose IV fluids and nausea medication. We will check labs.  Labs have resulted fairly abnormal showing an extremely elevated potassium  greater than 7.5 with hypocalcemia. Patient states she has not missed any dialysis appointments including this past Friday. We will discuss the patient with her nephrologist. Patient will require urgent/emergent dialysis. Given the patient's EKG changes I have ordered calcium gluconate. We will also dose insulin and glucose and continue with IV fluids. Patient's blood pressure is improving on IV fluids currently with a systolic in the mid 33K. Patient will require admission to the hospital. Labs also show an elevated lipase consistent with pancreatitis. Anion gap of 19.   CRITICAL CARE Performed by: Harvest Dark   Total critical care time: 60 minutes  Critical care time was exclusive of separately billable procedures and treating other patients.  Critical care was necessary to treat or prevent imminent or life-threatening deterioration.  Critical care was time spent personally  by me on the following activities: development of treatment plan with patient and/or surrogate as well as nursing, discussions with consultants, evaluation of patient's response to treatment, examination of patient, obtaining history from patient or surrogate, ordering and performing treatments and interventions, ordering and review of laboratory studies, ordering and review of radiographic studies, pulse oximetry and re-evaluation of patient's condition.  ____________________________________________   FINAL CLINICAL IMPRESSION(S) / ED DIAGNOSES  Hypertension Nausea and vomiting  generalized weakness Hyperkalemia Hypocalcemia Hyponatremia Dehydration Acute pancreatitis   Harvest Dark, MD 06/21/16 0000

## 2016-06-20 NOTE — Progress Notes (Signed)
Ceftriaxone dose increased to 2 gm IV Q24H due to indication per P&T.  Conny Situ A. Am[place], [place].D., BCPS Clinical Pharmacist 06/20/2016 19:46

## 2016-06-20 NOTE — Progress Notes (Signed)
PRE DIALYSIS ASSESSMENT 

## 2016-06-20 NOTE — ED Triage Notes (Signed)
Per pt's husband. Pt has been vomiting and very weak today, pt has "blood cancer" per husband and is a pt of dr Grayland Ormond, pt is pale diaphoretic, weak and actively vomiting, pt unable to assist staff to move from w/c to bed, pt lifted by staff

## 2016-06-20 NOTE — Progress Notes (Signed)
[facility] Progress Note Patient Name: Irys Nigh DOB: [birth year] MRN: [id number]   Date of Service  06/20/2016  HPI/Events of Note  ESRD patient admitted with N/V and hyperkalemia.  Plan is for dialysis tonight,  eICU Interventions  Chart reviewed Consult to follow     Intervention Category Evaluation Type: New Patient Evaluation  Mauri [place], P 06/20/2016, 10:03 PM

## 2016-06-20 NOTE — Progress Notes (Signed)
Famotidine renally adjusted to 20 mg IV Q24H  Benigno Check A. [place], [place].D., BCPS Clinical Pharmacist 06/20/2016 20:04

## 2016-06-20 NOTE — H&P (Signed)
History and Physical    Jamie Keller IRJ:[id number] DOB: October 19, 1965 DOA: 06/20/2016  Referring physician: Dr. Kerman Passey PCP: [name], MD  Specialists: Dr. Grayland Ormond  Chief Complaint: vomiting  HPI: Jamie Keller is a 51 y.o. female has a past medical history significant for multiple myeloma and ESRD on HD now with 2 day hx of vomiting. In ER, pt with worsening renal function and K+>7.5. EKG changes noted. She is now admitted. No fever. Denies diarrhea. Denies cough, CP, or SOB  Review of Systems: The patient denies fever, weight loss,, vision loss, decreased hearing, hoarseness, chest pain, syncope, dyspnea on exertion, peripheral edema, balance deficits, hemoptysis, abdominal pain, melena, hematochezia, severe indigestion/heartburn, hematuria, incontinence, genital sores, muscle weakness, suspicious skin lesions, transient blindness, difficulty walking, depression, unusual weight change, abnormal bleeding, enlarged lymph nodes, angioedema, and breast masses.   Past Medical History:  Diagnosis Date  . Anemia   . Anxiety   . Chronic kidney disease    DIALYSIS  . Depression   . Diabetes mellitus without complication ([place])   . Dialysis patient ([place])    Mon, Wed, Fri  . History of chemotherapy    chemo on Tuesday and Wednesday  . Hypertension   . Multiple myeloma ([place])   . Neuropathy associated with cancer ([place])   . Osteoarthritis   . Pancreatitis   . Pneumonia    September 2017, [facility]  . Post-menopausal 2014  . Sepsis [facility])    Past Surgical History:  Procedure Laterality Date  . AV FISTULA PLACEMENT Left 02/20/2016   Procedure: INSERTION OF ARTERIOVENOUS (AV) GORE-TEX GRAFT ARM;  Surgeon: Katha Cabal, MD;  Location: [facility] ORS;  Service: Vascular;  Laterality: Left;  . CESAREAN SECTION     x2  . dialysis catheter placement    . PERIPHERAL VASCULAR CATHETERIZATION N/A 11/19/2015   Procedure: Dialysis/Perma Catheter Insertion;  Surgeon: Algernon Huxley, MD;  Location: [place] CV LAB;  Service: Cardiovascular;  Laterality: N/A;  . PERIPHERAL VASCULAR CATHETERIZATION Left 03/10/2016   Procedure: Thrombectomy;  Surgeon: Katha Cabal, MD;  Location: [place] CV LAB;  Service: Cardiovascular;  Laterality: Left;  . PERIPHERAL VASCULAR CATHETERIZATION N/A 03/31/2016   Procedure: Dialysis/Perma Catheter Removal;  Surgeon: Katha Cabal, MD;  Location: [place] CV LAB;  Service: Cardiovascular;  Laterality: N/A;  . PORTACATH PLACEMENT    . TUBAL LIGATION     Social History:  reports that she has never smoked. She has never used smokeless tobacco. She reports that she does not drink alcohol or use drugs.  No Known Allergies  Family History  Problem Relation Age of Onset  . Hypercholesterolemia Mother   . Hypertension Mother   . Hypertension Father     Prior to Admission medications   Medication Sig Start Date End Date Taking? Authorizing Provider  citalopram (CELEXA) 20 MG tablet Take 1 tablet (20 mg total) by mouth daily. 04/13/16  Yes Lloyd Huger, MD  Insulin Detemir (LEVEMIR FLEXPEN) 100 UNIT/ML Pen Inject 5 Units into the skin daily at 10 pm.   Yes Historical Provider, MD  Oxycodone HCl 10 MG TABS Take 1 tablet (10 mg total) by mouth every 4 (four) hours as needed (take 1 tablet by mouth every 4 - 6 hours as needed for severe pain). 05/18/16  Yes Lloyd Huger, MD  prochlorperazine (COMPAZINE) 10 MG tablet Take 10 mg by mouth every 6 (six) hours as needed for nausea or vomiting.   Yes Historical Provider, MD  verapamil (CALAN-SR) 120 MG CR tablet Take 120 mg by mouth daily.   Yes Historical Provider, MD  Insulin Pen Needle (NOVOFINE) 30G X 8 MM MISC Inject 10 each into the skin as needed. 04/02/16   Hillary Bow, MD   Physical Exam: Vitals:   06/20/16 1603 06/20/16 1607 06/20/16 1619  Pulse:  98   Temp:   97.1 F (36.2 C)  TempSrc:   Rectal  Weight: 40.4 kg (89 lb)       General:  No apparent  distress, [place]/AT, WDWN  Eyes: PERRL, EOMI, no scleral icterus, conjunctiva clear  ENT: moist oropharynx without exudate, TM's benign, dentition fair  Neck: supple, no lymphadenopathy. No bruits or thyromegaly  Cardiovascular: regular rate without MRG; 2+ peripheral pulses, no JVD, no peripheral edema  Respiratory: CTA biL, good air movement without wheezing, rhonchi or crackled. Respiratory effort normal  Abdomen: soft, mildly tender to palpation, positive bowel sounds, no guarding, no rebound  Skin: no rashes or lesions  Musculoskeletal: normal bulk and tone, no joint swelling  Psychiatric: normal mood and affect, A&OX3  Neurologic: CN 2-12 grossly intact, Motor strength 5/5 in all 4 groups with symmetric DTR's and non-focal sensory exam  Labs on Admission:  Basic Metabolic Panel:  Recent Labs Lab 06/15/16 0837 06/16/16 1154 06/20/16 1616 06/20/16 1629  NA 134*  --  129*  --   K 5.1 6.6* >7.5*  --   CL 97*  --  88*  --   CO2 24  --  22  --   GLUCOSE 284*  --  211*  --   BUN 59*  --  103*  --   CREATININE 4.16*  --  6.53*  --   CALCIUM 6.5*  --  5.5*  --   MG  --   --   --  2.5*   Liver Function Tests:  Recent Labs Lab 06/15/16 0837 06/20/16 1616  AST 22 23  ALT 13* 14  ALKPHOS 531* 362*  BILITOT 0.7 0.8  PROT 6.4* 6.5  ALBUMIN 3.7 3.9    Recent Labs Lab 06/20/16 1616  LIPASE 137*   No results for input(s): AMMONIA in the last 168 hours. CBC:  Recent Labs Lab 06/15/16 0837 06/20/16 1620  WBC 4.4 7.2  NEUTROABS 3.0 6.4  HGB 11.0* 11.4*  HCT 32.5* 34.4*  MCV 99.7 100.2*  PLT 169 172   Cardiac Enzymes:  Recent Labs Lab 06/20/16 1620  TROPONINI <0.03    BNP (last 3 results)  Recent Labs  04/01/16 0800 05/25/16 1531  BNP 1,769.0* >4,500.0*    ProBNP (last 3 results) No results for input(s): PROBNP in the last 8760 hours.  CBG:  Recent Labs Lab 06/20/16 1728 06/20/16 1817  GLUCAP 210* 227*    Radiological Exams on  Admission: No results found.  EKG: Independently reviewed.  Assessment/Plan Principal Problem:   Acute on chronic renal failure (HCC) Active Problems:   Hyperkalemia   Abnormal EKG   Vomiting   Will admit to stepdown for urgent HD. Will consult Oncology and Nephrology. Follow sugars and repeat renal panel often. Monitor on telemetry. Repeat labs in AM. Will begin empiric IV ABX.  Diet: clear liquids Fluids: NS@75  DVT Prophylaxis: SQ Heparin  Code Status: FULL  Family Communication: none  Disposition Plan: home  Time spent: 55 min

## 2016-06-21 DIAGNOSIS — Z9221 Personal history of antineoplastic chemotherapy: Secondary | ICD-10-CM

## 2016-06-21 DIAGNOSIS — Z794 Long term (current) use of insulin: Secondary | ICD-10-CM

## 2016-06-21 DIAGNOSIS — F419 Anxiety disorder, unspecified: Secondary | ICD-10-CM

## 2016-06-21 DIAGNOSIS — R531 Weakness: Secondary | ICD-10-CM

## 2016-06-21 DIAGNOSIS — R109 Unspecified abdominal pain: Secondary | ICD-10-CM

## 2016-06-21 DIAGNOSIS — Z8619 Personal history of other infectious and parasitic diseases: Secondary | ICD-10-CM

## 2016-06-21 DIAGNOSIS — Z8719 Personal history of other diseases of the digestive system: Secondary | ICD-10-CM

## 2016-06-21 DIAGNOSIS — I129 Hypertensive chronic kidney disease with stage 1 through stage 4 chronic kidney disease, or unspecified chronic kidney disease: Secondary | ICD-10-CM

## 2016-06-21 DIAGNOSIS — E119 Type 2 diabetes mellitus without complications: Secondary | ICD-10-CM

## 2016-06-21 DIAGNOSIS — Z79899 Other long term (current) drug therapy: Secondary | ICD-10-CM

## 2016-06-21 DIAGNOSIS — C9000 Multiple myeloma not having achieved remission: Secondary | ICD-10-CM

## 2016-06-21 DIAGNOSIS — Z992 Dependence on renal dialysis: Secondary | ICD-10-CM

## 2016-06-21 DIAGNOSIS — F329 Major depressive disorder, single episode, unspecified: Secondary | ICD-10-CM

## 2016-06-21 DIAGNOSIS — G629 Polyneuropathy, unspecified: Secondary | ICD-10-CM

## 2016-06-21 DIAGNOSIS — M1990 Unspecified osteoarthritis, unspecified site: Secondary | ICD-10-CM

## 2016-06-21 LAB — LACTIC ACID, PLASMA: Lactic Acid, Venous: 0.7 mmol/L (ref 0.5–1.9)

## 2016-06-21 LAB — MAGNESIUM: MAGNESIUM: 1.7 mg/dL (ref 1.7–2.4)

## 2016-06-21 LAB — CBC
HEMATOCRIT: 32.3 % — AB (ref 35.0–47.0)
Hemoglobin: 11.1 g/dL — ABNORMAL LOW (ref 12.0–16.0)
MCH: 34.3 pg — ABNORMAL HIGH (ref 26.0–34.0)
MCHC: 34.3 g/dL (ref 32.0–36.0)
MCV: 100.1 fL — AB (ref 80.0–100.0)
PLATELETS: 153 10*3/uL (ref 150–440)
RBC: 3.23 MIL/uL — ABNORMAL LOW (ref 3.80–5.20)
RDW: 18.9 % — ABNORMAL HIGH (ref 11.5–14.5)
WBC: 8.2 10*3/uL (ref 3.6–11.0)

## 2016-06-21 LAB — GLUCOSE, CAPILLARY
GLUCOSE-CAPILLARY: 75 mg/dL (ref 65–99)
GLUCOSE-CAPILLARY: 231 mg/dL — AB (ref 65–99)
Glucose-Capillary: 144 mg/dL — ABNORMAL HIGH (ref 65–99)

## 2016-06-21 LAB — COMPREHENSIVE METABOLIC PANEL
ALK PHOS: 330 U/L — AB (ref 38–126)
ALT: 15 U/L (ref 14–54)
AST: 24 U/L (ref 15–41)
Albumin: 3.3 g/dL — ABNORMAL LOW (ref 3.5–5.0)
Anion gap: 9 (ref 5–15)
BILIRUBIN TOTAL: 0.6 mg/dL (ref 0.3–1.2)
BUN: 20 mg/dL (ref 6–20)
CO2: 29 mmol/L (ref 22–32)
Calcium: 6.4 mg/dL — CL (ref 8.9–10.3)
Chloride: 99 mmol/L — ABNORMAL LOW (ref 101–111)
Creatinine, Ser: 2.3 mg/dL — ABNORMAL HIGH (ref 0.44–1.00)
GFR calc Af Amer: 27 mL/min — ABNORMAL LOW (ref >60)
GFR, EST NON AFRICAN AMERICAN: 23 mL/min — AB (ref >60)
Glucose, Bld: 139 mg/dL — ABNORMAL HIGH (ref 65–99)
Potassium: 4.4 mmol/L (ref 3.5–5.1)
Sodium: 137 mmol/L (ref 135–145)
Total Protein: 5.7 g/dL — ABNORMAL LOW (ref 6.5–8.1)

## 2016-06-21 LAB — PROCALCITONIN: Procalcitonin: 1.35 ng/mL

## 2016-06-21 LAB — PHOSPHORUS: PHOSPHORUS: 2.9 mg/dL (ref 2.5–4.6)

## 2016-06-21 LAB — POTASSIUM: POTASSIUM: 3.7 mmol/L (ref 3.5–5.1)

## 2016-06-21 MED ORDER — METOCLOPRAMIDE HCL 5 MG/ML IJ SOLN: 5.0000 mg | Freq: Four times a day (QID) | INTRAMUSCULAR | Status: DC | PRN

## 2016-06-21 MED ORDER — HYDROMORPHONE HCL 1 MG/ML IJ SOLN: 0.5000 mg | INTRAMUSCULAR | Status: DC | PRN

## 2016-06-21 MED ORDER — DEXTROSE 50 % IV SOLN
1.0000 | Freq: Once | INTRAVENOUS | Status: AC
  Administered 2016-06-21: 50 mL via INTRAVENOUS

## 2016-06-21 MED ORDER — DEXTROSE 50 % IV SOLN
INTRAVENOUS | Status: AC
  Administered 2016-06-21: 50 mL via INTRAVENOUS
  Filled 2016-06-21: qty 50

## 2016-06-21 MED ORDER — SODIUM CHLORIDE 0.9 % IV SOLN
1.0000 g | Freq: Once | INTRAVENOUS | Status: AC
  Administered 2016-06-21: 1 g via INTRAVENOUS
  Filled 2016-06-21: qty 10

## 2016-06-21 MED ORDER — ACETAMINOPHEN 325 MG PO TABS
650.0000 mg | ORAL_TABLET | Freq: Four times a day (QID) | ORAL | Status: DC | PRN
  Administered 2016-06-21: 650 mg via ORAL
  Filled 2016-06-21: qty 2

## 2016-06-21 MED ORDER — ACETAMINOPHEN 325 MG PO TABS: 650.0000 mg | ORAL_TABLET | ORAL | Status: DC | PRN

## 2016-06-21 NOTE — Progress Notes (Signed)
Subjective:   Patient is feeling better today No shortness of breath Potassium level was corrected No edema Able to eat without nausea or vomiting  Objective:  Vital signs in last 24 hours:  Temp:  [97.7 F (36.5 C)-98.6 F (37 C)] 98.6 F (37 C) (04/16 1200) Pulse Rate:  [77-99] 84 (04/16 1200) Resp:  [15-29] 26 (04/16 1200) BP: (93-174)/(43-97) 115/61 (04/16 1200) SpO2:  [95 %-99 %] 97 % (04/16 1200) Weight:  [43.2 kg (95 lb 3.8 oz)-45 kg (99 lb 3.3 oz)] 45 kg (99 lb 3.3 oz) (04/16 0500)  Weight change:  Filed Weights   06/20/16 2100 06/21/16 0030 06/21/16 0500  Weight: 44.4 kg (97 lb 14.2 oz) 43.2 kg (95 lb 3.8 oz) 45 kg (99 lb 3.3 oz)    Intake/Output:    Intake/Output Summary (Last 24 hours) at 06/21/16 1659 Last data filed at 06/21/16 1056  Gross per 24 hour  Intake             2943 ml  Output              952 ml  Net             1991 ml     Physical Exam: General: No acute distress  HEENT anicteric  Neck supple  Pulm/lungs Normal effort, clear to auscultation  CVS/Heart regular  Abdomen:  Soft, nontender, nondistended  Extremities: No peripheral edema  Neurologic: Alert, oriented  Skin: No acute rashes          Basic Metabolic Panel:   Recent Labs Lab 06/15/16 0837 06/16/16 1154 06/20/16 1616 06/20/16 1629 06/20/16 2058 06/21/16 0023 06/21/16 0433  NA 134*  --  129*  --  134*  --  137  K 5.1 6.6* >7.5*  --  7.1* 3.7 4.4  CL 97*  --  88*  --  99*  --  99*  CO2 24  --  22  --  22  --  29  GLUCOSE 284*  --  211*  --  103*  --  139*  BUN 59*  --  103*  --  87*  --  20  CREATININE 4.16*  --  6.53*  --  5.26*  --  2.30*  CALCIUM 6.5*  --  5.5*  --  5.5*  --  6.4*  MG  --   --   --  2.5*  --   --  1.7  PHOS  --   --   --   --  6.2*  --  2.9     CBC:  Recent Labs Lab 06/15/16 0837 06/20/16 1620 06/21/16 0433  WBC 4.4 7.2 8.2  NEUTROABS 3.0 6.4  --   HGB 11.0* 11.4* 11.1*  HCT 32.5* 34.4* 32.3*  MCV 99.7 100.2* 100.1*  PLT 169  172 153      Microbiology:  Recent Results (from the past 720 hour(s))  Culture, blood (routine x 2)     Status: None   Collection Time: 05/25/16  3:31 PM  Result Value Ref Range Status   Specimen Description BLOOD PORT  Final   Special Requests BOTTLES DRAWN AEROBIC AND ANAEROBIC BCAV  Final   Culture NO GROWTH 5 DAYS  Final   Report Status 05/30/2016 FINAL  Final  Culture, blood (routine x 2)     Status: None   Collection Time: 05/25/16  3:31 PM  Result Value Ref Range Status   Specimen Description BLOOD RIGHT AC  Final  Special Requests BOTTLES DRAWN AEROBIC AND ANAEROBIC BCAV  Final   Culture NO GROWTH 5 DAYS  Final   Report Status 05/30/2016 FINAL  Final  Urine culture     Status: Abnormal   Collection Time: 05/25/16  3:31 PM  Result Value Ref Range Status   Specimen Description URINE, RANDOM  Final   Special Requests NONE  Final   Culture MULTIPLE SPECIES PRESENT, SUGGEST RECOLLECTION (A)  Final   Report Status 05/26/2016 FINAL  Final  MRSA PCR Screening     Status: None   Collection Time: 05/25/16  8:22 PM  Result Value Ref Range Status   MRSA by PCR NEGATIVE NEGATIVE Final    Comment:        The GeneXpert MRSA Assay (FDA approved for NASAL specimens only), is one component of a comprehensive MRSA colonization surveillance program. It is not intended to diagnose MRSA infection nor to guide or monitor treatment for MRSA infections.   Blood culture (routine x 2)     Status: None (Preliminary result)   Collection Time: 06/20/16  4:41 PM  Result Value Ref Range Status   Specimen Description BLOOD rt forarm  Final   Special Requests NONE  Final   Culture NO GROWTH < 24 HOURS  Final   Report Status PENDING  Incomplete  Blood culture (routine x 2)     Status: None (Preliminary result)   Collection Time: 06/20/16  4:41 PM  Result Value Ref Range Status   Specimen Description BLOOD  port  Final   Special Requests NONE  Final   Culture NO GROWTH < 24 HOURS  Final    Report Status PENDING  Incomplete  MRSA PCR Screening     Status: None   Collection Time: 06/20/16  8:02 PM  Result Value Ref Range Status   MRSA by PCR NEGATIVE NEGATIVE Final    Comment:        The GeneXpert MRSA Assay (FDA approved for NASAL specimens only), is one component of a comprehensive MRSA colonization surveillance program. It is not intended to diagnose MRSA infection nor to guide or monitor treatment for MRSA infections.     Coagulation Studies: No results for input(s): LABPROT, INR in the last 72 hours.  Urinalysis: No results for input(s): COLORURINE, LABSPEC, PHURINE, GLUCOSEU, HGBUR, BILIRUBINUR, KETONESUR, PROTEINUR, UROBILINOGEN, NITRITE, LEUKOCYTESUR in the last 72 hours.  Invalid input(s): APPERANCEUR    Imaging: No results found.   Medications:    . cefTRIAXone  2 g Intravenous Q24H  . citalopram  20 mg Oral Daily  . docusate sodium  100 mg Oral BID  . famotidine (PEPCID) IV  20 mg Intravenous Daily  . heparin  5,000 Units Subcutaneous Q8H  . insulin aspart  0-9 Units Subcutaneous TID WC  . insulin detemir  5 Units Subcutaneous Q2200  . sodium chloride flush  3 mL Intravenous Q12H  . verapamil  120 mg Oral Daily   acetaminophen, bisacodyl, diphenoxylate-atropine, HYDROmorphone (DILAUDID) injection, metoCLOPramide (REGLAN) injection, ondansetron **OR** ondansetron (ZOFRAN) IV  Assessment/ Plan:  51 y.o.Hispanic female  with diabetes mellitus type II, hypertension, multiple myeloma, ESRD on hemodialysis.   Jamie Keller   1. End Stage Renal Disease: with hyperkalemia:  ESRD secondary to multiple myeloma Emergent hemodialysis done last night.  Potassium level now corrected - resume MWF schedule.   2.  Anemia of chronic kidney disease: hemoglobin 11.1 - follow hematology recommendations  3. Hyperkalemia Now collected   4. Multiple myeloma on  chemotherapy: last chemotherapy was Tuesday 4/10 - Followed by Dr.  Grayland Ormond.    LOS: 1 Jamie Keller 4/16/20184:59 PM

## 2016-06-21 NOTE — Progress Notes (Signed)
HD COMPLETED  

## 2016-06-21 NOTE — Progress Notes (Signed)
CRITICAL VALUE ALERT  Critical value received:  Calcium 6.4  Date of notification:  06/21/16  Time of notification:  4098  Critical value read back:Yes.    Nurse who received alert:  Hildred Alamin   Ms. Patria Mane, NP notified.

## 2016-06-21 NOTE — Progress Notes (Signed)
Per Dr Brigitte Pulse de-accessed right upper chest port. Stacy RN removed. Dressing applied dry and intact.

## 2016-06-21 NOTE — Progress Notes (Addendum)
MD in to see patient prior to scheduled transfer to Med/Surg unit.  Patient able to empty her bladder on toilet in room. Pt and MD discussed plan of care and follow-up appointments made with cancer center. Discharge instructions given to patient, husband and son. Interpreter present.  Daughter notified of plan to discharge home. Port de-accessed per Dr. Manuella Ghazi.

## 2016-06-21 NOTE — Clinical Social Work Note (Signed)
CSW consulted for placement by nursing. Patient has not yet been evaluated by PT at this time. Patient is also listed as having no payor source and thus would have no coverage for placement at this time. Please re-consult CSW if needs arise. Shela Leff MSW,LCSW [phone]

## 2016-06-21 NOTE — Consult Note (Signed)
[facility]  [phone] [phone]  ID: [name] OB: 02/05/1966  MR#: [id number]  TKZ#:[id number]  [name], MD as PCP - General (Family Medicine)  CHIEF COMPLAINT: Intractable nausea and vomiting, end-stage renal disease with hyperkalemia requiring emergent dialysis, multiple myeloma.  INTERVAL HISTORY: Patient is a 51 year old female who is actively receiving chemotherapy for underlying multiple myeloma who is admitted to the ICU with hypokalemia requiring emergent dialysis. Patient reported 2 days of increased nausea and vomiting leading to extreme weakness, diffuse abdominal pain, and lightheadedness. She is found to have a significantly elevated potassium level with EKG changes which have now resolved. Patient feels improved and nearly back to her baseline. She is at the side of the bed eating lunch. She has no neurologic complaints. She denies any fevers. She denies any chest pain or shortness of breath. She denies any further nausea or vomiting. She denies any pain. Patient feels nearly back to her baseline and offers no specific complaints today.  REVIEW OF SYSTEMS:   Review of Systems  Constitutional: Negative.  Negative for fever, malaise/fatigue and weight loss.  Respiratory: Negative.  Negative for cough and shortness of breath.   Cardiovascular: Negative.  Negative for chest pain and leg swelling.  Gastrointestinal: Negative.  Negative for abdominal pain, nausea and vomiting.  Genitourinary: Negative.   Musculoskeletal: Negative.   Neurological: Negative.  Negative for sensory change and weakness.  Psychiatric/Behavioral: Negative.  The patient is not nervous/anxious.     As per HPI. Otherwise, a complete review of systems is negative.  PAST MEDICAL HISTORY: Past Medical History:  Diagnosis Date  . Anemia   . Anxiety   . Chronic kidney disease    DIALYSIS  . Depression   . Diabetes  mellitus without complication ([place])   . Dialysis patient ([place])    Mon, Wed, Fri  . History of chemotherapy    chemo on Tuesday and Wednesday  . Hypertension   . Multiple myeloma ([place])   . Neuropathy associated with cancer ([place])   . Osteoarthritis   . Pancreatitis   . Pneumonia    September 2017, [facility]  . Post-menopausal 2014  . Sepsis ([place])     PAST SURGICAL HISTORY: Past Surgical History:  Procedure Laterality Date  . AV FISTULA PLACEMENT Left 02/20/2016   Procedure: INSERTION OF ARTERIOVENOUS (AV) GORE-TEX GRAFT ARM;  Surgeon: Katha Cabal, MD;  Location: [facility] ORS;  Service: Vascular;  Laterality: Left;  . CESAREAN SECTION     x2  . dialysis catheter placement    . PERIPHERAL VASCULAR CATHETERIZATION N/A 11/19/2015   Procedure: Dialysis/Perma Catheter Insertion;  Surgeon: Algernon Huxley, MD;  Location: [facility] CV LAB;  Service: Cardiovascular;  Laterality: N/A;  . PERIPHERAL VASCULAR CATHETERIZATION Left 03/10/2016   Procedure: Thrombectomy;  Surgeon: Katha Cabal, MD;  Location: [facility] CV LAB;  Service: Cardiovascular;  Laterality: Left;  . PERIPHERAL VASCULAR CATHETERIZATION N/A 03/31/2016   Procedure: Dialysis/Perma Catheter Removal;  Surgeon: Katha Cabal, MD;  Location: [facility] CV LAB;  Service: Cardiovascular;  Laterality: N/A;  . PORTACATH PLACEMENT    . TUBAL LIGATION      FAMILY HISTORY: Family History  Problem Relation Age of Onset  . Hypercholesterolemia Mother   . Hypertension Mother   . Hypertension Father     ADVANCED DIRECTIVES (Y/N):  @ADVDIR @  HEALTH MAINTENANCE: Social History  Substance Use Topics  . Smoking status: Never Smoker  . Smokeless tobacco: Never  Used  . Alcohol use No     Colonoscopy:  PAP:  Bone density:  Lipid panel:  No Known Allergies  Current Facility-Administered Medications  Medication Dose Route Frequency Provider Last Rate Last Dose  . acetaminophen (TYLENOL) tablet 650 mg  650 mg Oral Q6H  PRN Wilhelmina Mcardle, MD   650 mg at 06/21/16 0768  . bisacodyl (DULCOLAX) suppository 10 mg  10 mg Rectal Daily PRN Idelle Crouch, MD      . cefTRIAXone (ROCEPHIN) IVPB 2 g  2 g Intravenous Q24H Idelle Crouch, MD      . citalopram (CELEXA) tablet 20 mg  20 mg Oral Daily Idelle Crouch, MD   20 mg at 06/21/16 1055  . diphenoxylate-atropine (LOMOTIL) 2.5-0.025 MG per tablet 1 tablet  1 tablet Oral QID PRN Idelle Crouch, MD      . docusate sodium (COLACE) capsule 100 mg  100 mg Oral BID Idelle Crouch, MD   100 mg at 06/21/16 1055  . famotidine (PEPCID) IVPB 20 mg premix  20 mg Intravenous Daily Idelle Crouch, MD   20 mg at 06/21/16 0338  . heparin injection 5,000 Units  5,000 Units Subcutaneous Q8H Idelle Crouch, MD   5,000 Units at 06/21/16 0630  . HYDROmorphone (DILAUDID) injection 0.5 mg  0.5 mg Intravenous Q3H PRN Mikael Spray, NP      . insulin aspart (novoLOG) injection 0-9 Units  0-9 Units Subcutaneous TID WC Idelle Crouch, MD   3 Units at 06/21/16 1226  . insulin detemir (LEVEMIR) injection 5 Units  5 Units Subcutaneous Q2200 Idelle Crouch, MD      . metoCLOPramide (REGLAN) injection 5 mg  5 mg Intravenous Q6H PRN Mikael Spray, NP      . ondansetron (ZOFRAN) tablet 4 mg  4 mg Oral Q6H PRN Idelle Crouch, MD       Or  . ondansetron [facility]) injection 4 mg  4 mg Intravenous Q6H PRN Idelle Crouch, MD   4 mg at 06/21/16 0504  . sodium chloride flush (NS) 0.9 % injection 3 mL  3 mL Intravenous Q12H Idelle Crouch, MD   3 mL at 06/21/16 1056  . verapamil (CALAN-SR) CR tablet 120 mg  120 mg Oral Daily Idelle Crouch, MD   120 mg at 06/21/16 0139   Facility-Administered Medications Ordered in Other Encounters  Medication Dose Route Frequency Provider Last Rate Last Dose  . LORazepam (ATIVAN) injection 1 mg  1 mg Intravenous Once Lloyd Huger, MD        OBJECTIVE: Vitals:   06/21/16 1100 06/21/16 1200  BP: (!) 110/50 115/61  Pulse: 84 84    Resp: (!) 24 (!) 26  Temp:  98.6 F (37 C)     Body mass index is 19.38 kg/m.    ECOG FS:1 - Symptomatic but completely ambulatory  General: Well-developed, well-nourished, no acute distress. Eyes: Pink conjunctiva, anicteric sclera. HEENT: Normocephalic, moist mucous membranes, clear oropharnyx. Lungs: Clear to auscultation bilaterally. Heart: Regular rate and rhythm. No rubs, murmurs, or gallops. Abdomen: Soft, nontender, nondistended. No organomegaly noted, normoactive bowel sounds. Musculoskeletal: No edema, cyanosis, or clubbing. Neuro: Alert, answering all questions appropriately. Cranial nerves grossly intact. Skin: No rashes or petechiae noted. Psych: Normal affect. Lymphatics: No cervical, calvicular, axillary or inguinal LAD.   LAB RESULTS:  Lab Results  Component Value Date   NA 137 06/21/2016   K 4.4 06/21/2016  CL 99 (L) 06/21/2016   CO2 29 06/21/2016   GLUCOSE 139 (H) 06/21/2016   BUN 20 06/21/2016   CREATININE 2.30 (H) 06/21/2016   CALCIUM 6.4 (LL) 06/21/2016   PROT 5.7 (L) 06/21/2016   ALBUMIN 3.3 (L) 06/21/2016   AST 24 06/21/2016   ALT 15 06/21/2016   ALKPHOS 330 (H) 06/21/2016   BILITOT 0.6 06/21/2016   GFRNONAA 23 (L) 06/21/2016   GFRAA 27 (L) 06/21/2016    Lab Results  Component Value Date   WBC 8.2 06/21/2016   NEUTROABS 6.4 06/20/2016   HGB 11.1 (L) 06/21/2016   HCT 32.3 (L) 06/21/2016   MCV 100.1 (H) 06/21/2016   PLT 153 06/21/2016     STUDIES: Dg Chest 2 View  Result Date: 05/25/2016 CLINICAL DATA:  Shortness of breath. EXAM: CHEST  2 VIEW COMPARISON:  Radiographs of April 01, 2016. FINDINGS: Stable cardiomediastinal silhouette. Right internal jugular Port-A-Cath is unchanged in position. No pneumothorax is noted. Bilateral pulmonary edema and central pulmonary vascular congestion is noted. Moderate bilateral pleural effusions are noted which are increased compared to prior exam. Bony thorax is unremarkable. IMPRESSION: Findings  consistent with bilateral pulmonary edema and pleural effusions which have increased compared to prior exam. Electronically Signed   By: Marijo Conception, M.D.   On: 05/25/2016 16:23   Dg Chest Port 1 View  Result Date: 05/26/2016 CLINICAL DATA:  51 year old female with shortness of breath. Evaluate for interval change. EXAM: PORTABLE CHEST 1 VIEW COMPARISON:  Chest radiograph dated 05/25/2016 FINDINGS: There bilateral pleural effusions with associated compressive atelectasis versus infiltrate of the lower lobes. Overall there has been interval worsening of the aeration of the lungs with increased vascular congestion and edema. There is no pneumothorax. The cardiac borders are silhouetted. Right pectoral Port-A-Cath in stable positioning. Osteopenia with degenerative changes of the spine. Left axillary vascular stent. No acute osseous pathology. IMPRESSION: Interval worsening of the CHF and increased size of the pleural effusion with overall decreased aeration of the lungs since the most recent prior study. Electronically Signed   By: Anner Crete M.D.   On: 05/26/2016 04:59    ASSESSMENT: Intractable nausea and vomiting, end-stage renal disease with hyperkalemia requiring emergent dialysis, multiple myeloma.  PLAN:    1. Intractable nausea and vomiting: Although patient is actively receiving chemotherapy, her most recent infusion was nearly 2 weeks ago. It is unlikely her acute onset nausea and vomiting was directly related to her chemotherapy. Also patient now has had 4 infusions of her current treatment without similar symptoms. Also, given the fact that her nausea and vomiting has essentially improved after 24 hours make it less likely related to chemotherapy. Possibly related to gastroenteritis. Symptoms have now resolved. 2. Multiple myeloma: Patient received cycle 4 of daratumumab approximately 2 weeks ago. Cycle 5 was scheduled for tomorrow, but this will be delayed one week. Please insure  patient has follow-up for M.D. evaluation, laboratory work, and reconsideration of treatment on June 29, 2016. 3. End-stage renal disease: Continue dialysis as per nephrology. 4. Hyperkalemia: Resolved.   Appreciate consult, will follow.   Lloyd Huger, MD   06/21/2016 1:34 PM

## 2016-06-21 NOTE — Progress Notes (Signed)
Patient is alert and oriented. Has been able to make needs known throughout the shift. No further complaints of pain after patient received tylenol this morning.Lungs remain clear to auscultation on room air. Sinus rhythm on monitor.  Tolerating renal diet with a good appetite.  Report called to [facility] on the Med/Surg unit.  All questions answered. PT in to work with patient prior to transfer. Will notify daughter of transfer.

## 2016-06-21 NOTE — Progress Notes (Addendum)
Bladder scanned patient. Noted to have 450 ml. Per Dr Brigitte Pulse in and out cath patient. Patient decided that she wanted to sit on commode and run water to try. She was successful and emptied bladder.

## 2016-06-21 NOTE — Progress Notes (Signed)
POST DIALYSIS ASSESSMENT 

## 2016-06-21 NOTE — Evaluation (Signed)
Physical Therapy Evaluation Patient Details Name: Jamie Keller MRN: [id number] DOB: 12-08-1965 Today's Date: 06/21/2016   History of Present Illness  Pt is a 51 year old female who is actively receiving chemotherapy for underlying multiple myeloma who is admitted to the ICU with hypokalemia requiring emergent dialysis. Patient reported 2 days of increased nausea and vomiting leading to extreme weakness, diffuse abdominal pain, and lightheadedness. She was found to have a significantly elevated potassium level with EKG changes which have now resolved. Patient feels improved and nearly back to her baseline.  She has no neurologic complaints. She denies any fevers. She denies any chest pain or shortness of breath. She denies any further nausea or vomiting. She denies any pain. Patient feels nearly back to her baseline and offers no specific complaints today.    Clinical Impression  Pt presents with mild deficits in strength, transfers, gait, balance, and activity tolerance.  Pt Ind with bed mobility tasks and SBA with sit to/from stand transfers.  Pt able to amb 40' with RW and SBA with slow cadence but good stability.  Pt presented with severe weakness and functional deficits upon admission to hospital but appears to be progressing well towards baseline.  Pt would benefit from HHPT and 24 hr assistance upon discharge home to address above deficits for a safe return to prior level of function and resulting decrease in fall risk and caregiver assistance.      Follow Up Recommendations Home health PT    Equipment Recommendations  Rolling walker with 5" wheels    Recommendations for Other Services       Precautions / Restrictions Precautions Precautions: Fall Restrictions Weight Bearing Restrictions: No      Mobility  Bed Mobility Overal bed mobility: Independent                Transfers Overall transfer level: Needs assistance Equipment used: Rolling walker (2  wheeled) Transfers: Sit to/from Stand Sit to Stand: Supervision            Ambulation/Gait Ambulation/Gait assistance: Supervision Ambulation Distance (Feet): 40 Feet Assistive device: Rolling walker (2 wheeled) Gait Pattern/deviations: Decreased step length - right;Decreased step length - left   Gait velocity interpretation: Below normal speed for age/gender General Gait Details: Slow cadence with gait with RW but steady without LOB  Stairs            Wheelchair Mobility    Modified Rankin (Stroke Patients Only)       Balance Overall balance assessment: Needs assistance Sitting-balance support: Feet supported Sitting balance-Leahy Scale: Good     Standing balance support: Bilateral upper extremity supported Standing balance-Leahy Scale: Good                               Pertinent Vitals/Pain Pain Assessment: No/denies pain    Home Living Family/patient expects to be discharged to:: Private residence Living Arrangements: Spouse/significant other Available Help at Discharge: Family;Available PRN/intermittently Type of Home: House Home Access: Stairs to enter   [employer] of Steps: 2 Home Layout: One level Home Equipment: None      Prior Function Level of Independence: Independent         Comments: Spouse takes pt to HD     Hand Dominance        Extremity/Trunk Assessment        Lower Extremity Assessment Lower Extremity Assessment: Generalized weakness       Communication   Communication: Interpreter  utilized  Cognition Arousal/Alertness: Awake/alert Behavior During Therapy: WFL for tasks assessed/performed Overall Cognitive Status: Within Functional Limits for tasks assessed                                        General Comments      Exercises Total Joint Exercises Ankle Circles/Pumps: AROM;Both;10 reps;5 reps Quad Sets: Strengthening;Both;5 reps;10 reps Heel Slides: AROM;Both;5  reps Hip ABduction/ADduction: AROM;Both;10 reps Straight Leg Raises: AROM;Both;10 reps Other Exercises Other Exercises: HEP education/practice with BLE APs, QS, and GS x 10 each 5-6x/day   Assessment/Plan    PT Assessment Patient needs continued PT services  PT Problem List Decreased strength;Decreased activity tolerance       PT Treatment Interventions Gait training;Stair training;Neuromuscular re-education;Balance training;Therapeutic exercise;Therapeutic activities;Patient/family education    PT Goals (Current goals can be found in the Care Plan section)  Acute Rehab PT Goals Patient Stated Goal: To go home PT Goal Formulation: With patient Time For Goal Achievement: 07/04/16 Potential to Achieve Goals: Good    Frequency Min 2X/week   Barriers to discharge        Co-evaluation               End of Session Equipment Utilized During Treatment: Gait belt Activity Tolerance: Patient tolerated treatment well Patient left: in bed;with call bell/phone within reach Nurse Communication: Mobility status PT Visit Diagnosis: Muscle weakness (generalized) (M62.81);Difficulty in walking, not elsewhere classified (R26.2)    Time: 0223-3612 PT Time Calculation (min) (ACUTE ONLY): 37 min   Charges:   PT Evaluation $PT Eval Low Complexity: 1 Procedure PT Treatments $Therapeutic Exercise: 8-22 mins   PT G Codes:        DRoyetta Asal PT, DPT 06/21/16, 3:08 PM

## 2016-06-21 NOTE — Care Management Note (Signed)
Case Management Note  Patient Details  Name: Jamie Keller MRN: [id number] Date of Birth: 09-08-1965  Subjective/Objective:                  Met with patient (Spanish Speaking) and her daughter (fluent English speaking) regarding discharge planning. Patient is from home with a very supportive family. Her mother and father was staying with her but recently left which leaves patient alone "only on Thursdays". At baseline, patient is independent with mobility however her daughter worries that she may fall while alone. She has a walker if she needs one. I suggested patient use it for stability especially while alone. She states that the cancer center has been assisting her with medications and that she has never been without her medications or medical treatment. She goes to [facility] for hemodialysis treatment. She denies having issues with transportation.  Action/Plan: Jamie Keller with [facility]/dialysis liaison updated on patient admission. No current RNCM needs.    Expected Discharge Date:                  Expected Discharge Plan:     In-House Referral:     Discharge planning Services  CM Consult  Post Acute Care Choice:    Choice offered to:  Adult Children, Patient  DME Arranged:    DME Agency:     HH Arranged:    HH Agency:     Status of Service:  In process, will continue to follow  If discussed at Long Length of Stay Meetings, dates discussed:    Additional Comments:  Jamie Garfinkel, RN 06/21/2016, 10:48 AM

## 2016-06-21 NOTE — Care Management (Signed)
Patient discharging to home today. [facility] dialysis liaison notified. No RNCM needs.

## 2016-06-21 NOTE — Progress Notes (Signed)
PULMONARY / CRITICAL CARE MEDICINE   Name: Jamie Keller MRN: [id number] DOB: 06/09/1965    ADMISSION DATE:  06/20/2016   CONSULTATION DATE:  06/20/2016  REFERRING MD:  Dr. Fulton Reek  REASON FOR CONSULT: ICU management of hyperkalemia with EKG changes and need for emergent he,odialysis  CHIEF COMPLAINT:  Nausea and vomiting  HISTORY OF PRESENT ILLNESS:   This is a 51 y/o female with ESRD on HD M-W-F, type 2 DM, hypertension, and  multiple myeloma who presented to the ED following tow days of nausea and vomiting. Emesis is mostly clear liquids and bile. Patient reports extreme weakness, diffuse abdominal pain, blurred vision and lightheadedness. Mild relief with antiepileptics. She is still receiving chemotherapy for her multiple myeloma and her last session was on Tuesday, April 10. At the ED, patient was hypotensive with systolic blood pressures in the 80s and was also diaphoretic. Her blood pressure improved and she subsequently became severely hypertensive. Her labs showed a potassium level of >7 with subtle EKG changes. She is being admitted to the ICU for emergent HD.    SUBJECTIVE: Moderate improvement in vomiting but persistent nausea. Potassium down to 3.7 post HD. Reports generalized weakness   VITAL SIGNS: BP (!) 127/57   Pulse 94   Temp 98.3 F (36.8 C) (Oral)   Resp (!) 29   Wt 99 lb 3.3 oz (45 kg)   LMP 02/25/1995 Comment: Tubal ligation 1996  SpO2 96%   BMI 19.38 kg/m   HEMODYNAMICS:    VENTILATOR SETTINGS:    INTAKE / OUTPUT: I/O last 3 completed shifts: In: 2840 [P.O.:540; I.V.:50; Other:50; IV Piggyback:2200] Out: 476 [Urine:125; Other:827]  PHYSICAL EXAMINATION: General:  Acutely ill-looking female, in mild-to-moderate distress Neuro:  Alert and oriented 4, no focal deficits HEENT: PERRLA, trachea midline, oral mucosa dry Cardiovascular: Rate and rhythm regular, S1, S2 audible, no murmur reculture gallop, +2 pulses bilaterally, no  edema Lungs:  Clear to auscultation bilaterally Abdomen: Nondistended, normal bowel sounds in all 4 quadrants, palpation reveals no organomegaly and no tenderness Musculoskeletal: Positive range of motion in upper and lower extremities, no joint swelling Skin: Pale, skin is cold to touch  LABS:  BMET  Recent Labs Lab 06/20/16 1616 06/20/16 2058 06/21/16 0023 06/21/16 0433  NA 129* 134*  --  137  K >7.5* 7.1* 3.7 4.4  CL 88* 99*  --  99*  CO2 22 22  --  29  BUN 103* 87*  --  20  CREATININE 6.53* 5.26*  --  2.30*  GLUCOSE 211* 103*  --  139*    Electrolytes  Recent Labs Lab 06/20/16 1616 06/20/16 1629 06/20/16 2058 06/21/16 0433  CALCIUM 5.5*  --  5.5* 6.4*  MG  --  2.5*  --  1.7  PHOS  --   --  6.2* 2.9    CBC  Recent Labs Lab 06/15/16 0837 06/20/16 1620 06/21/16 0433  WBC 4.4 7.2 8.2  HGB 11.0* 11.4* 11.1*  HCT 32.5* 34.4* 32.3*  PLT 169 172 153    Coag's No results for input(s): APTT, INR in the last 168 hours.  Sepsis Markers  Recent Labs Lab 06/20/16 1641 06/21/16 0023  LATICACIDVEN 2.3* 0.7    ABG No results for input(s): PHART, PCO2ART, PO2ART in the last 168 hours.  Liver Enzymes  Recent Labs Lab 06/15/16 0837 06/20/16 1616 06/20/16 2058 06/21/16 0433  AST 22 23  --  24  ALT 13* 14  --  15  ALKPHOS 531* 362*  --  330*  BILITOT 0.7 0.8  --  0.6  ALBUMIN 3.7 3.9 3.2* 3.3*    Cardiac Enzymes  Recent Labs Lab 06/20/16 1620  TROPONINI <0.03    Glucose  Recent Labs Lab 06/20/16 1728 06/20/16 1817 06/20/16 2001 06/20/16 2236 06/21/16 0116 06/21/16 0710  GLUCAP 210* 227* 90 70 75 144*    Imaging No results found.  STUDIES:  None  CULTURES: Blood cultures x 2  ANTIBIOTICS: Ceftriaxone 4/15>  SIGNIFICANT EVENTS: 04/15> ED with nausea and vomiting, admitted with severe hypokalemia  LINES/TUBES: Peripheral IVs Left hemodialysis shunt  DISCUSSION: This is a 51 year old Hispanic female presenting with  intractable nausea and vomiting, severe hypokalemia, hypocalcemia and multiple myeloma on chemotherapy.'s. Patient's symptoms are most likely related to her multiple myeloma treatment. She is on a Monday, Wednesday, Friday hemodialysis schedule and reports being compliant with all her medications and dialysis schedule. She is much better post emergent dialysis, but she is still having episodes of severe nausea. Her WBC is normal  hence, I doubt any bacterial GI etiology  ASSESSMENT  Intractable nausea and vomiting-doubt gastroenteritis Severe hyperkalemia-resolved End-stage renal disease on hemodialysis. Multiple Myeloma in relapse with multiple bony lytic lesions Anemia of chronic disease-current hemoglobin 11.1 g/dL  Type 2 diabetes mellitus History of Chronic back pain History of depression Depression Hypocalcemia Mild lactic acidosis  PLAN Zofran and Reglan when necessary for nausea and vomiting. Hemodialysis per nephrology Repeat potassium level postdialysis. Oncology consulted. Trend hemoglobin and hematocrit Glucose monitoring with sliding-scale insulin coverage per ICU protocol Clear liquids for now and progress diet as tolerated Pain management with when necessary Tylenol and dilaudid  Potassium gluconate 1 g IV 1. Pro-calcitonin protocol and if normal, discontinue ceftriaxone No indication for any abdominal imaging at this point Monitor and correct electrolyte imbalances GI and DVT prophylaxis    Magdalene S. [facility] ANP-BC Pulmonary and [place] Pager [phone] or [phone] 06/21/2016, 7:26 AM   Pt with ESRD admitted with N/V and severe hyperkalemia. Now much improved after emergent HD. Agree with above findings, assessment and plan. Transfer to [facility] will sign off. Please call if we can be of further assistance  Merton Border, MD PCCM service Mobile [phone] Pager [phone] 06/21/2016

## 2016-06-21 NOTE — Care Management (Signed)
Patient is listed as self pay. She is receiving dialysis. She is also followed by oncology services.  Patient goes to [facility] for PCP. [facility] for medications. Patient has been informed about [facility] and Medication Management on previous admissions. Patient chronic HD patient M, W, F.  Amanda Morris with [facility]/dialysis updated on patient's status. I spoke with Elease Etienne social worker at cancer center to see what was in place already for this patient on previous admission. Per Jack's response they have been helping patient with Co-pays on medications and some other bills in the past.  I have sent message to Ellie Lunch with [facility] to see if any assistance might be available or if patient has even accessed this resource. RNCM will continue to follow.

## 2016-06-21 NOTE — Progress Notes (Signed)
3 hours of dialysis; greater than 800 cc taken off (see post- dialysis assessment). Tolerating clear liquids advanced to renal diet. Labs this AM; potassium decreasing. Blood sugar 75 over night; pt given 1 amp of D50, levimir held. No other care concerns. Report given to Angela/Sandra.

## 2016-06-21 NOTE — Discharge Instructions (Signed)
Hiperpotasiemia (Hyperkalemia) La hiperpotasiemia se produce cuando hay demasiado potasio en la sangre. Normalmente, los riones eliminan el potasio del cuerpo. El exceso de potasio en la sangre puede afectar el funcionamiento del corazn. [place] causas de la hiperpotasiemia, se incluyen las siguientes:  Consumo excesivo de [place]. Esto puede deberse a:  Uso de sustitutos de la sal. Estos sustitutos contienen mucha cantidad de potasio.  Consumo de suplementos de potasio.  Consumo de alimentos con alto contenido de potasio.  Eliminacin de poca cantidad de potasio. Esto puede suceder si:  Los riones no funcionan como corresponde. La enfermedad en los riones (renal), incluida la insuficiencia renal a corto o largo plazo, es una causa muy frecuente de hiperpotasiemia.  Est tomando medicamentos que reduzcan la eliminacin de potasio.  Tiene la enfermedad de [place].  Tiene una obstruccin en las vas urinarias, como piedras en los riones.  Est en tratamiento para limpiar la sangre de forma mecnica (dilisis) y omite una sesin.  Liberacin de una alta cantidad de potasio de las clulas en la sangre. Esto puede suceder debido a lo siguiente:  [place] (rabdomilisis) u otros tejidos. La mayor parte del potasio se almacena en los msculos.  Infecciones o quemaduras graves.  Plasma sanguneo cido (acidosis). La acidosis puede aparecer por muchas enfermedades, como la diabetes no controlada. FACTORES DE RIESGO El factor de riesgo ms frecuente de la hiperpotasiemia es la enfermedad renal. Entre los otros factores de riesgo de hiperpotasiemia, se incluyen los siguientes:  Enfermedad de [place]. En esta afeccin, las glndulas no producen la cantidad suficiente de hormonas.  Alcoholismo o abuso de drogas.  Uso de determinados medicamentos para la presin arterial, como inhibidores de la enzima convertidora de la angiotensina (IECA), antagonistas de los  receptores de la angiotensinaII (ARA) o diurticos ahorradores de Field seismologist, Designer, fashion/clothing.  Lesin o quemadura grave. SIGNOS Y SNTOMAS A menudo, la hiperpotasiemia no causa signos ni sntomas. Sin embargo, cuando el nivel de potasio se eleva demasiado, puede presentar los siguientes sntomas:  Latidos cardacos irregulares o muy lentos.  Nuseas.  Fatiga.  Hormigueo en la piel o adormecimiento de las manos o los pies.  Debilidad muscular.  Fatiga.  Incapacidad de moverse (parlisis). Es posible que no tenga ningn sntoma de hiperpotasiemia. DIAGNSTICO La hiperpotasiemia puede diagnosticarse de la siguiente manera:  Examen fsico.  Anlisis de sangre.  ECG (electrocardiograma).  Anlisis sobre el uso de medicamentos recetados y no recetados. TRATAMIENTO El tratamiento de la hiperpotasiemia a menudo se [place] a la causa preexistente. En algunos casos, el tratamiento puede incluir lo siguiente:  Insulina.  Solucin de glucosa (azcar) y agua administrada a travs de una vena (intravenosa o IV).  Dilisis.  Medicamentos para eliminar el potasio del cuerpo.  Medicamentos para enviar el potasio desde el torrente [employer] tejidos. [place] los medicamentos solamente como se lo haya indicado el mdico.  No use ningn suplemento, producto natural, hierba o vitamina sin antes consultarlo con su mdico. Determinados suplementos y productos alimenticios naturales pueden tener mucha cantidad de potasio.  Limite el consumo de alcohol como se lo haya indicado el mdico.  No consuma drogas. Si necesita ayuda para dejar de fumar, consulte al mdico.  Concurra a todas las visitas de control como se lo haya indicado el mdico. Esto es importante.  Si tiene una enfermedad renal, es posible que deba seguir una dieta baja en potasio. Un nutricionista puede ayudarlo a [employer]  con bajo contenido de  potasio. SOLICITE ATENCIN MDICA SI:  Nota que tiene latidos cardacos irregulares o muy lentos.  Tiene sensacin de desvanecimiento.  Se siente dbil.  Siente nuseas.  Siente hormigueo o adormecimiento en las manos o los pies. SOLICITE ATENCIN MDICA DE INMEDIATO SI:  Le falta el aire.  Siente dolor u opresin en el pecho.  Se desmaya.  Tiene parlisis muscular. ASEGRESE DE QUE:  Comprende estas instrucciones.  Controlar su afeccin.  Recibir ayuda de inmediato si no mejora o si empeora. Esta informacin no tiene Marine scientist el consejo del mdico. Asegrese de hacerle al mdico cualquier pregunta que tenga. Document Released: 02/22/2005 Document Revised: 03/15/2014 Document Reviewed: 05/30/2013 Elsevier Interactive Patient Education  2017 [place] (Hyperkalemia) La hiperpotasiemia se produce cuando hay demasiado potasio en la sangre. Normalmente, los riones eliminan el potasio del cuerpo. El exceso de potasio en la sangre puede afectar el funcionamiento del corazn. [place] causas de la hiperpotasiemia, se incluyen las siguientes:  Consumo excesivo de [place]. Esto puede deberse a:  Uso de sustitutos de la sal. Estos sustitutos contienen mucha cantidad de potasio.  Consumo de suplementos de potasio.  Consumo de alimentos con alto contenido de potasio.  Eliminacin de poca cantidad de potasio. Esto puede suceder si:  Los riones no funcionan como corresponde. La enfermedad en los riones (renal), incluida la insuficiencia renal a corto o largo plazo, es una causa muy frecuente de hiperpotasiemia.  Est tomando medicamentos que reduzcan la eliminacin de potasio.  Tiene la enfermedad de [place].  Tiene una obstruccin en las vas urinarias, como piedras en los riones.  Est en tratamiento para limpiar la sangre de forma mecnica (dilisis) y omite una sesin.  Liberacin de una alta cantidad de potasio de las  clulas en la sangre. Esto puede suceder debido a lo siguiente:  Dynegy (rabdomilisis) u otros tejidos. La mayor parte del potasio se almacena en los msculos.  Infecciones o quemaduras graves.  Plasma sanguneo cido (acidosis). La acidosis puede aparecer por muchas enfermedades, como la diabetes no controlada. FACTORES DE RIESGO El factor de riesgo ms frecuente de la hiperpotasiemia es la enfermedad renal. Entre los otros factores de riesgo de hiperpotasiemia, se incluyen los siguientes:  Enfermedad de [place]. En esta afeccin, las glndulas no producen la cantidad suficiente de hormonas.  Alcoholismo o abuso de drogas.  Uso de determinados medicamentos para la presin arterial, como inhibidores de la enzima convertidora de la angiotensina (IECA), antagonistas de los receptores de la angiotensinaII (ARA) o diurticos ahorradores de Field seismologist, Designer, fashion/clothing.  Lesin o quemadura grave. SIGNOS Y SNTOMAS A menudo, la hiperpotasiemia no causa signos ni sntomas. Sin embargo, cuando el nivel de potasio se eleva demasiado, puede presentar los siguientes sntomas:  Latidos cardacos irregulares o muy lentos.  Nuseas.  Fatiga.  Hormigueo en la piel o adormecimiento de las manos o los pies.  Debilidad muscular.  Fatiga.  Incapacidad de moverse (parlisis). Es posible que no tenga ningn sntoma de hiperpotasiemia. DIAGNSTICO La hiperpotasiemia puede diagnosticarse de la siguiente manera:  Examen fsico.  Anlisis de sangre.  ECG (electrocardiograma).  Anlisis sobre el uso de medicamentos recetados y no recetados. TRATAMIENTO El tratamiento de la hiperpotasiemia a menudo se [place] a la causa preexistente. En algunos casos, el tratamiento puede incluir lo siguiente:  Insulina.  Solucin de glucosa (azcar) y agua administrada a travs de una vena (intravenosa o IV).  Dilisis.  Medicamentos para eliminar el potasio del cuerpo.  Medicamentos  para enviar el potasio desde el torrente [employer] tejidos. [place] los medicamentos solamente como se lo haya indicado el mdico.  No use ningn suplemento, producto natural, hierba o vitamina sin antes consultarlo con su mdico. Determinados suplementos y productos alimenticios naturales pueden tener mucha cantidad de potasio.  Limite el consumo de alcohol como se lo haya indicado el mdico.  No consuma drogas. Si necesita ayuda para dejar de fumar, consulte al mdico.  Concurra a todas las visitas de control como se lo haya indicado el mdico. Esto es importante.  Si tiene una enfermedad renal, es posible que deba seguir una dieta baja en potasio. Un nutricionista puede ayudarlo a [employer] con bajo contenido de [place]. SOLICITE ATENCIN MDICA SI:  Nota que tiene latidos cardacos irregulares o muy lentos.  Tiene sensacin de desvanecimiento.  Se siente dbil.  Siente nuseas.  Siente hormigueo o adormecimiento en las manos o los pies. SOLICITE ATENCIN MDICA DE INMEDIATO SI:  Le falta el aire.  Siente dolor u opresin en el pecho.  Se desmaya.  Tiene parlisis muscular. ASEGRESE DE QUE:  Comprende estas instrucciones.  Controlar su afeccin.  Recibir ayuda de inmediato si no mejora o si empeora. Esta informacin no tiene Marine scientist el consejo del mdico. Asegrese de hacerle al mdico cualquier pregunta que tenga. Document Released: 02/22/2005 Document Revised: 03/15/2014 Document Reviewed: 05/30/2013 Elsevier Interactive Patient Education  2017 [employer].

## 2016-06-22 ENCOUNTER — Inpatient Hospital Stay: Payer: Self-pay

## 2016-06-22 ENCOUNTER — Inpatient Hospital Stay: Payer: Self-pay | Admitting: Oncology

## 2016-06-23 LAB — BLOOD GAS, VENOUS
Acid-base deficit: 3.4 mmol/L — ABNORMAL HIGH (ref 0.0–2.0)
BICARBONATE: 22.7 mmol/L (ref 20.0–28.0)
O2 SAT: 62.5 %
PCO2 VEN: 45 mmHg (ref 44.0–60.0)
PH VEN: 7.31 (ref 7.250–7.430)
PO2 VEN: 36 mmHg (ref 32.0–45.0)
Patient temperature: 37

## 2016-06-24 NOTE — Discharge Summary (Signed)
[facility] NAME: Jamie Keller    MR#:  [id number]  [place]:  08-27-1965  DATE OF ADMISSION:  06/20/2016   ADMITTING PHYSICIAN: Idelle Crouch, MD  DATE OF DISCHARGE: 06/21/2016  5:41 PM  PRIMARY CARE PHYSICIAN: [name], MD   ADMISSION DIAGNOSIS:  Hypocalcemia [E83.51] Hyperkalemia [E87.5] Weakness [R53.1] Non-intractable vomiting with nausea, unspecified vomiting type [R11.2] DISCHARGE DIAGNOSIS:  Principal Problem:   Acute on chronic renal failure (HCC) Active Problems:   Hyperkalemia   Abnormal EKG   Vomiting   Hypocalcemia  SECONDARY DIAGNOSIS:   Past Medical History:  Diagnosis Date  . Anemia   . Anxiety   . Chronic kidney disease    DIALYSIS  . Depression   . Diabetes mellitus without complication ([place])   . Dialysis patient ([place])    Mon, Wed, Fri  . History of chemotherapy    chemo on Tuesday and Wednesday  . Hypertension   . Multiple myeloma ([place])   . Neuropathy associated with cancer ([place])   . Osteoarthritis   . Pancreatitis   . Pneumonia    September 2017, [facility]  . Post-menopausal 2014  . Sepsis [facility])    HOSPITAL COURSE:  51 y/o female with ESRD on HD M-W-F, type 2 DM, hypertension, and  multiple myeloma who presented to the ED following tow days of nausea and vomiting. Emesis is mostly clear liquids and bile. Patient reports extreme weakness, diffuse abdominal pain, blurred vision and lightheadedness. Mild relief with antiepileptics. She is still receiving chemotherapy for her multiple myeloma and her last session was on Tuesday, April 10. At the ED, patient was hypotensive with systolic blood pressures in the 80s and was also diaphoretic. Her blood pressure improved and she subsequently became severely hypertensive. Her labs showed a potassium level of >7 with subtle EKG changes. She was being admitted to the ICU for emergent HD. She would normally receive HD at [facility]   1. End Stage Renal Disease: with hyperkalemia:  ESRD secondary to multiple myeloma Emergent hemodialysis done on admission.  Potassium level corrected - resume MWF schedule.   2. Anemia of chronic kidney disease: hemoglobin 11.1  3. Hyperkalemia with EKG changes Now corrected   4. Multiple myeloma on chemotherapy: last chemotherapy was Tuesday 4/10 - Followed by Dr. Grayland Ormond.   DISCHARGE CONDITIONS:  stable CONSULTS OBTAINED:  Treatment Team:  Hermelinda Dellen, DO Lavonia Dana, MD DRUG ALLERGIES:  No Known Allergies DISCHARGE MEDICATIONS:   Allergies as of 06/21/2016   No Known Allergies     Medication List    TAKE these medications   citalopram 20 MG tablet Commonly known as:  CELEXA Take 1 tablet (20 mg total) by mouth daily.   Insulin Pen Needle 30G X 8 MM Misc Commonly known as:  NOVOFINE Inject 10 each into the skin as needed.   LEVEMIR FLEXPEN 100 UNIT/ML Pen Generic drug:  Insulin Detemir Inject 5 Units into the skin daily at 10 pm.   Oxycodone HCl 10 MG Tabs Take 1 tablet (10 mg total) by mouth every 4 (four) hours as needed (take 1 tablet by mouth every 4 - 6 hours as needed for severe pain).   prochlorperazine 10 MG tablet Commonly known as:  COMPAZINE Take 10 mg by mouth every 6 (six) hours as needed for nausea or vomiting.   verapamil 120 MG CR tablet Commonly known as:  CALAN-SR Take 120 mg by  mouth daily.        DISCHARGE INSTRUCTIONS:   DIET:  Regular diet DISCHARGE CONDITION:  Good ACTIVITY:  Activity as tolerated OXYGEN:  Home Oxygen: No.  Oxygen Delivery: room air DISCHARGE LOCATION:  home   If you experience worsening of your admission symptoms, develop shortness of breath, life threatening emergency, suicidal or homicidal thoughts you must seek medical attention immediately by calling 911 or calling your MD immediately  if symptoms less severe.  You Must read complete instructions/literature along with all  the possible adverse reactions/side effects for all the Medicines you take and that have been prescribed to you. Take any new Medicines after you have completely understood and accpet all the possible adverse reactions/side effects.   Please note  You were cared for by a hospitalist during your hospital stay. If you have any questions about your discharge medications or the care you received while you were in the hospital after you are discharged, you can call the unit and asked to speak with the hospitalist on call if the hospitalist that took care of you is not available. Once you are discharged, your primary care physician will handle any further medical issues. Please note that NO REFILLS for any discharge medications will be authorized once you are discharged, as it is imperative that you return to your primary care physician (or establish a relationship with a primary care physician if you do not have one) for your aftercare needs so that they can reassess your need for medications and monitor your lab values.    On the day of Discharge:  VITAL SIGNS:  Blood pressure (!) 116/58, pulse 80, temperature 98.6 F (37 C), temperature source Oral, resp. rate (!) 22, weight 45 kg (99 lb 3.3 oz), last menstrual period 02/25/1995, SpO2 97 %. PHYSICAL EXAMINATION:  GENERAL:  51 y.o.-year-old patient lying in the bed with no acute distress.  EYES: Pupils equal, round, reactive to light and accommodation. No scleral icterus. Extraocular muscles intact.  HEENT: Head atraumatic, normocephalic. Oropharynx and nasopharynx clear.  NECK:  Supple, no jugular venous distention. No thyroid enlargement, no tenderness.  LUNGS: Normal breath sounds bilaterally, no wheezing, rales,rhonchi or crepitation. No use of accessory muscles of respiration.  CARDIOVASCULAR: S1, S2 normal. No murmurs, rubs, or gallops.  ABDOMEN: Soft, non-tender, non-distended. Bowel sounds present. No organomegaly or mass.  EXTREMITIES: No  pedal edema, cyanosis, or clubbing.  NEUROLOGIC: Cranial nerves II through XII are intact. Muscle strength 5/5 in all extremities. Sensation intact. Gait not checked.  PSYCHIATRIC: The patient is alert and oriented x 3.  SKIN: No obvious rash, lesion, or ulcer.  DATA REVIEW:   CBC  Recent Labs Lab 06/21/16 0433  WBC 8.2  HGB 11.1*  HCT 32.3*  PLT 153    Chemistries   Recent Labs Lab 06/21/16 0433  NA 137  K 4.4  CL 99*  CO2 29  GLUCOSE 139*  BUN 20  CREATININE 2.30*  CALCIUM 6.4*  MG 1.7  AST 24  ALT 15  ALKPHOS 330*  BILITOT 0.6     Follow-up Information    [name], MD. Schedule an appointment as soon as possible for a visit in 1 week(s).   Specialty:  Family Medicine Contact information: [address] [place] [address] [phone]        Lloyd Huger, MD. Schedule an appointment as soon as possible for a visit in 1 week(s).   Specialty:  Oncology Contact information: [place]  [place] [address] [phone]           Management plans discussed with the patient, family and they are in agreement.  CODE STATUS: Prior   TOTAL TIME TAKING CARE OF THIS PATIENT: 45 minutes.    Max Sane M.D on 06/24/2016 at 10:07 AM  Between 7am to 6pm - Pager - [phone]  After 6pm go to [email or web address] - Proofreader  [facility]  Office  [phone]  CC: Primary care physician; [name], MD   Note: This dictation was prepared with Dragon dictation along with smaller phrase technology. Any transcriptional errors that result from this process are unintentional.

## 2016-06-25 LAB — CULTURE, BLOOD (ROUTINE X 2)
CULTURE: NO GROWTH
Culture: NO GROWTH

## 2016-06-28 NOTE — Progress Notes (Signed)
[facility]  [phone] [phone]  ID: Jamie Keller OB: 1965/07/02  MR#: [id number]  EYE#:[id number]  [name], MD as PCP - General (Family Medicine)  CHIEF COMPLAINT: Multiple Myeloma in relapse with multiple bony lytic lesions, now with end-stage renal disease on dialysis.   INTERVAL HISTORY: Patient returns to clinic today for further evaluation and consideration of cycle 6 of single agent daratumumab. She continues to feel chronically weak and fatigued. Her pain is better controlled. She denies any fever or chills. She has a poor appetite, but her weight has remained stable.  She continues to have chronic back pain that would occasionally radiate down her leg.  She has dyspnea on exertion, but denies cough, hemoptysis, or chest pain. She complains of mild nausea and occasional vomiting. Patient offers no further specific complaints today.   REVIEW OF SYSTEMS:   Review of Systems  Constitutional: Positive for malaise/fatigue. Negative for chills, fever and weight loss.  HENT: Negative for congestion.   Respiratory: Positive for shortness of breath. Negative for cough.   Cardiovascular: Negative.  Negative for chest pain and leg swelling.  Gastrointestinal: Negative for abdominal pain, blood in stool, constipation, diarrhea, nausea and vomiting.  Genitourinary: Negative for hematuria.       On dialysis  Musculoskeletal: Positive for back pain.  Skin: Negative for itching and rash.  Neurological: Positive for tingling, sensory change and weakness. Negative for focal weakness and headaches.  Psychiatric/Behavioral: Positive for hallucinations. The patient is nervous/anxious and has insomnia.    As per HPI. Otherwise, a complete review of systems is negative.   PAST MEDICAL HISTORY: Past Medical History:  Diagnosis Date  . Anemia   . Anxiety   . Chronic kidney disease    DIALYSIS  . Depression     . Diabetes mellitus without complication ([place])   . Dialysis patient ([place])    Mon, Wed, Fri  . History of chemotherapy    chemo on Tuesday and Wednesday  . Hypertension   . Multiple myeloma ([place])   . Neuropathy associated with cancer ([place])   . Osteoarthritis   . Pancreatitis   . Pneumonia    September 2017, [facility]  . Post-menopausal 2014  . Sepsis ([place])     PAST SURGICAL HISTORY: Past Surgical History:  Procedure Laterality Date  . AV FISTULA PLACEMENT Left 02/20/2016   Procedure: INSERTION OF ARTERIOVENOUS (AV) GORE-TEX GRAFT ARM;  Surgeon: Katha Cabal, MD;  Location: [facility] ORS;  Service: Vascular;  Laterality: Left;  . CESAREAN SECTION     x2  . dialysis catheter placement    . PERIPHERAL VASCULAR CATHETERIZATION N/A 11/19/2015   Procedure: Dialysis/Perma Catheter Insertion;  Surgeon: Algernon Huxley, MD;  Location: [place] [facility];  Service: Cardiovascular;  Laterality: N/A;  . PERIPHERAL VASCULAR CATHETERIZATION Left 03/10/2016   Procedure: Thrombectomy;  Surgeon: Katha Cabal, MD;  Location: [place] [facility];  Service: Cardiovascular;  Laterality: Left;  . PORTACATH PLACEMENT    . TUBAL LIGATION      FAMILY HISTORY: Reviewed and unchanged. No reported history of malignancy or chronic disease.     ADVANCED DIRECTIVES:    HEALTH MAINTENANCE: Social History  Substance Use Topics  . Smoking status: Never Smoker  . Smokeless tobacco: Never Used  . Alcohol use No     No Known Allergies  Current Outpatient Prescriptions  Medication Sig Dispense Refill  . acetaminophen (TYLENOL) 500 MG tablet Take 500 mg by mouth  every 6 (six) hours as needed.    . citalopram (CELEXA) 20 MG tablet Take 1 tablet (20 mg total) by mouth daily. 30 tablet 5  . HYDROcodone-acetaminophen (NORCO) 5-325 MG tablet Take 1-2 tablets by mouth every 6 (six) hours as needed for moderate pain or severe pain. 50 tablet 0  . Insulin Detemir (LEVEMIR FLEXPEN) 100 UNIT/ML Pen Inject 5 Units  into the skin daily at 10 pm.    . VERAPAMIL HCL ER PO Take 120 mg by mouth daily.    Marland Kitchen dronabinol (MARINOL) 2.5 MG capsule Take 1 capsule (2.5 mg total) by mouth 2 (two) times daily before a meal. (Patient not taking: Reported on 03/16/2016) 60 capsule 0  . ferrous sulfate 325 (65 FE) MG tablet Take 325 mg by mouth daily with breakfast.    . furosemide (LASIX) 80 MG tablet Take 1 tablet (80 mg total) by mouth daily. (Patient not taking: Reported on 03/16/2016) 30 tablet 0  . promethazine (PHENERGAN) 25 MG tablet Take 25 mg by mouth every 6 (six) hours as needed for nausea or vomiting.     No current facility-administered medications for this visit.    Facility-Administered Medications Ordered in Other Visits  Medication Dose Route Frequency Provider Last Rate Last Dose  . heparin lock flush 100 unit/mL  500 Units Intravenous Once Lloyd Huger, MD      . sodium chloride flush (NS) 0.9 % injection 10 mL  10 mL Intravenous Once Lloyd Huger, MD        Vitals:   06/29/16 0945  BP: (!) 146/71  Pulse: 83  Temp: 98.4 F (36.9 C)   ECOG FS:2 - Symptomatic  General: No acute distress. Eyes: anicteric sclera. Lungs: Clear to auscultation bilaterally. Heart: Regular rate and rhythm. No rubs, murmurs, or gallops. Abdomen: Soft, nontender, nondistended. No organomegaly noted, normoactive bowel sounds. Musculoskeletal: No edema, cyanosis, or clubbing. Neuro: Alert, answering all questions appropriately. Cranial nerves grossly intact. Skin: No rashes or petechiae noted.  Psych: Normal affect.   LAB RESULTS:  CBC    Component Value Date/Time   WBC 3.9 06/29/2016 0829   RBC 3.54 (L) 06/29/2016 0829   HGB 11.9 (L) 06/29/2016 0829   HGB 11.7 (L) 06/27/2014 1344   HCT 35.1 06/29/2016 0829   HCT 34.0 (L) 06/27/2014 1344   PLT 195 06/29/2016 0829   PLT 267 06/27/2014 1344   MCV 99.2 06/29/2016 0829   MCV 88 06/27/2014 1344   MCH 33.7 06/29/2016 0829   MCHC 34.0 06/29/2016 0829     RDW 17.1 (H) 06/29/2016 0829   RDW 13.4 06/27/2014 1344   LYMPHSABS 0.6 (L) 06/29/2016 0829   LYMPHSABS 0.9 (L) 06/27/2014 1344   MONOABS 0.5 06/29/2016 0829   MONOABS 0.9 06/27/2014 1344   EOSABS 0.0 06/29/2016 0829   EOSABS 0.2 06/27/2014 1344   BASOSABS 0.1 06/29/2016 0829   BASOSABS 0.1 06/27/2014 1344   BMET    Component Value Date/Time   NA 133 (L) 06/29/2016 0829   NA 137 06/27/2014 1344   K 2.8 (L) 06/30/2016 1500   K 4.0 06/27/2014 1344   CL 91 (L) 06/29/2016 0829   CL 106 06/27/2014 1344   CO2 24 06/29/2016 0829   CO2 26 06/27/2014 1344   GLUCOSE 281 (H) 06/29/2016 0829   GLUCOSE 83 06/27/2014 1344   BUN 62 (H) 06/29/2016 0829   BUN 27 (H) 06/27/2014 1344   CREATININE 4.51 (H) 06/29/2016 0829   CREATININE 1.36 (H) 06/27/2014  1344   CALCIUM 6.1 (LL) 06/29/2016 0829   CALCIUM 9.1 06/27/2014 1344   GFRNONAA 10 (L) 06/29/2016 0829   GFRNONAA 46 (L) 06/27/2014 1344   GFRAA 12 (L) 06/29/2016 0829   GFRAA 53 (L) 06/27/2014 1344   Lab Results  Component Value Date   TOTALPROTELP 5.6 (L) 06/15/2016   ALBUMINELP 3.7 06/15/2016   A1GS 0.2 06/15/2016   A2GS 0.7 06/15/2016   BETS 0.7 06/15/2016   GAMS 0.4 06/15/2016   MSPIKE 0.1 (H) 06/15/2016   SPEI Comment 06/15/2016        STUDIES: No results found.  ASSESSMENT: Multiple Myeloma in relapse with multiple bony lytic lesions, end-stage renal disease.   PLAN:    1. Multiple Myeloma in relapse with multiple bony lytic lesions: PET scan results reviewed independently consistent with progression of disease. Patient's kappa free light chains have trended down significantly. Daratumumab does not need to be dose reduced in the setting of end-stage renal disease and dialysis. Hold Xgeva given her history of hypocalcemia. Because of patient's immigration status, she could not undergo transplant. Proceed with cycle 6 today.  After the patient receives 8 weekly cycles, will switch to treatment every 2 weeks for an  additional 8 cycles and then monthly until progression of disease. Return to clinic in 1 week for consideration of cycle 7 and then in 2 weeks for further evaluation and consideration of cycle 8.  2. Back pain: Continue current narcotics as prescribed.  3. End-stage renal disease: Likely secondary to progression of disease. Continue dialysis on Monday, Wednesdays and Fridays. Has fistula. 4. Anemia: Hemoglobin decreased. Patient does not require transfusion at this time. Continue Procrit at dialysis as needed. 5. Hyperglycemia: Patient's blood glucose has improved since initiating insulin. Continue monitoring and treatment by primary care. Monitor closely with solumedrol as a pre-medication. 6. Thrombocytopenia: Resolved. 7. Depression: Continue Celexa 20 mg daily.  8. Weight Loss: Stable without medication. Patient stopped taking Marinol due to making her "feel funny". She states that she has an improved appetite without the medication. Will continue to monitor. 9. Constipation: Resolved. Take OTC medications if needed. Monitor.  10. Pruritus: Resolved. Patient states Benadryl works, but only for several hours. Continue Atarax as needed. 11. Confusion/hallucinations: MRI brian is negative. 12. Hypocalcemia: Patient received 2 g IV calcium gluconate today. Case also discussed with nephrology and they will attempt to increase her calcium with dialysis.   Interpreter present during entire clinic visit.  Patient expressed understanding and was in agreement with this plan. She also understands that She can call clinic at any time with any questions, concerns, or complaints.    Lloyd Huger, MD 07/02/16 1:40 PM

## 2016-06-29 ENCOUNTER — Inpatient Hospital Stay: Payer: Self-pay

## 2016-06-29 ENCOUNTER — Inpatient Hospital Stay (HOSPITAL_BASED_OUTPATIENT_CLINIC_OR_DEPARTMENT_OTHER): Payer: Self-pay | Admitting: Oncology

## 2016-06-29 ENCOUNTER — Encounter: Payer: Self-pay | Admitting: Oncology

## 2016-06-29 VITALS — BP 146/71 | HR 83 | Temp 98.4°F | Wt 94.8 lb

## 2016-06-29 DIAGNOSIS — Z794 Long term (current) use of insulin: Secondary | ICD-10-CM

## 2016-06-29 DIAGNOSIS — M549 Dorsalgia, unspecified: Secondary | ICD-10-CM

## 2016-06-29 DIAGNOSIS — Z992 Dependence on renal dialysis: Secondary | ICD-10-CM

## 2016-06-29 DIAGNOSIS — Z8619 Personal history of other infectious and parasitic diseases: Secondary | ICD-10-CM

## 2016-06-29 DIAGNOSIS — Z9221 Personal history of antineoplastic chemotherapy: Secondary | ICD-10-CM

## 2016-06-29 DIAGNOSIS — Z79899 Other long term (current) drug therapy: Secondary | ICD-10-CM

## 2016-06-29 DIAGNOSIS — R112 Nausea with vomiting, unspecified: Secondary | ICD-10-CM

## 2016-06-29 DIAGNOSIS — M1990 Unspecified osteoarthritis, unspecified site: Secondary | ICD-10-CM

## 2016-06-29 DIAGNOSIS — D649 Anemia, unspecified: Secondary | ICD-10-CM

## 2016-06-29 DIAGNOSIS — Z8701 Personal history of pneumonia (recurrent): Secondary | ICD-10-CM

## 2016-06-29 DIAGNOSIS — N186 End stage renal disease: Secondary | ICD-10-CM

## 2016-06-29 DIAGNOSIS — R630 Anorexia: Secondary | ICD-10-CM

## 2016-06-29 DIAGNOSIS — F329 Major depressive disorder, single episode, unspecified: Secondary | ICD-10-CM

## 2016-06-29 DIAGNOSIS — R5383 Other fatigue: Secondary | ICD-10-CM

## 2016-06-29 DIAGNOSIS — Z8719 Personal history of other diseases of the digestive system: Secondary | ICD-10-CM

## 2016-06-29 DIAGNOSIS — G8929 Other chronic pain: Secondary | ICD-10-CM

## 2016-06-29 DIAGNOSIS — C9002 Multiple myeloma in relapse: Secondary | ICD-10-CM

## 2016-06-29 DIAGNOSIS — G629 Polyneuropathy, unspecified: Secondary | ICD-10-CM

## 2016-06-29 DIAGNOSIS — E1165 Type 2 diabetes mellitus with hyperglycemia: Secondary | ICD-10-CM

## 2016-06-29 DIAGNOSIS — I129 Hypertensive chronic kidney disease with stage 1 through stage 4 chronic kidney disease, or unspecified chronic kidney disease: Secondary | ICD-10-CM

## 2016-06-29 DIAGNOSIS — F419 Anxiety disorder, unspecified: Secondary | ICD-10-CM

## 2016-06-29 DIAGNOSIS — R531 Weakness: Secondary | ICD-10-CM

## 2016-06-29 DIAGNOSIS — C9000 Multiple myeloma not having achieved remission: Secondary | ICD-10-CM

## 2016-06-29 LAB — CBC WITH DIFFERENTIAL/PLATELET
BASOS PCT: 2 %
Basophils Absolute: 0.1 10*3/uL (ref 0–0.1)
EOS ABS: 0 10*3/uL (ref 0–0.7)
Eosinophils Relative: 1 %
HCT: 35.1 % (ref 35.0–47.0)
HEMOGLOBIN: 11.9 g/dL — AB (ref 12.0–16.0)
Lymphocytes Relative: 15 %
Lymphs Abs: 0.6 10*3/uL — ABNORMAL LOW (ref 1.0–3.6)
MCH: 33.7 pg (ref 26.0–34.0)
MCHC: 34 g/dL (ref 32.0–36.0)
MCV: 99.2 fL (ref 80.0–100.0)
Monocytes Absolute: 0.5 10*3/uL (ref 0.2–0.9)
Monocytes Relative: 14 %
NEUTROS PCT: 68 %
Neutro Abs: 2.6 10*3/uL (ref 1.4–6.5)
Platelets: 195 10*3/uL (ref 150–440)
RBC: 3.54 MIL/uL — AB (ref 3.80–5.20)
RDW: 17.1 % — ABNORMAL HIGH (ref 11.5–14.5)
WBC: 3.9 10*3/uL (ref 3.6–11.0)

## 2016-06-29 LAB — COMPREHENSIVE METABOLIC PANEL
ALBUMIN: 3.7 g/dL (ref 3.5–5.0)
ALK PHOS: 286 U/L — AB (ref 38–126)
ALT: 15 U/L (ref 14–54)
ANION GAP: 18 — AB (ref 5–15)
AST: 29 U/L (ref 15–41)
BUN: 62 mg/dL — ABNORMAL HIGH (ref 6–20)
CALCIUM: 6.1 mg/dL — AB (ref 8.9–10.3)
CHLORIDE: 91 mmol/L — AB (ref 101–111)
CO2: 24 mmol/L (ref 22–32)
Creatinine, Ser: 4.51 mg/dL — ABNORMAL HIGH (ref 0.44–1.00)
GFR calc non Af Amer: 10 mL/min — ABNORMAL LOW (ref >60)
GFR, EST AFRICAN AMERICAN: 12 mL/min — AB (ref >60)
GLUCOSE: 281 mg/dL — AB (ref 65–99)
Potassium: 5.3 mmol/L — ABNORMAL HIGH (ref 3.5–5.1)
SODIUM: 133 mmol/L — AB (ref 135–145)
Total Bilirubin: 0.6 mg/dL (ref 0.3–1.2)
Total Protein: 6.7 g/dL (ref 6.5–8.1)

## 2016-06-29 MED ORDER — METHYLPREDNISOLONE SODIUM SUCC 125 MG IJ SOLR
125.0000 mg | Freq: Once | INTRAMUSCULAR | Status: AC
  Administered 2016-06-29: 125 mg via INTRAVENOUS
  Filled 2016-06-29: qty 2

## 2016-06-29 MED ORDER — PROCHLORPERAZINE MALEATE 10 MG PO TABS
10.0000 mg | ORAL_TABLET | Freq: Once | ORAL | Status: AC
  Administered 2016-06-29: 10 mg via ORAL
  Filled 2016-06-29: qty 1

## 2016-06-29 MED ORDER — SODIUM CHLORIDE 0.9% FLUSH
10.0000 mL | INTRAVENOUS | Status: DC | PRN
  Administered 2016-06-29: 10 mL
  Filled 2016-06-29: qty 10

## 2016-06-29 MED ORDER — SODIUM CHLORIDE 0.9 % IV SOLN
2.0000 g | Freq: Once | INTRAVENOUS | Status: AC
  Administered 2016-06-29: 2 g via INTRAVENOUS
  Filled 2016-06-29: qty 20

## 2016-06-29 MED ORDER — PROCHLORPERAZINE MALEATE 10 MG PO TABS: 10.0000 mg | ORAL_TABLET | Freq: Four times a day (QID) | ORAL | 1 refills | Status: DC | PRN

## 2016-06-29 MED ORDER — OXYCODONE HCL 10 MG PO TABS: 10.0000 mg | ORAL_TABLET | ORAL | 0 refills | Status: DC | PRN

## 2016-06-29 MED ORDER — SODIUM CHLORIDE 0.9 % IV SOLN
Freq: Once | INTRAVENOUS | Status: AC
  Administered 2016-06-29: 10:00:00 via INTRAVENOUS
  Filled 2016-06-29: qty 1000

## 2016-06-29 MED ORDER — SODIUM CHLORIDE 0.9 % IV SOLN
16.0000 mg/kg | Freq: Once | INTRAVENOUS | Status: AC
  Administered 2016-06-29: 720 mg via INTRAVENOUS
  Filled 2016-06-29: qty 36

## 2016-06-29 MED ORDER — ACETAMINOPHEN 325 MG PO TABS
650.0000 mg | ORAL_TABLET | Freq: Once | ORAL | Status: AC
  Administered 2016-06-29: 650 mg via ORAL
  Filled 2016-06-29: qty 2

## 2016-06-29 MED ORDER — HEPARIN SOD (PORK) LOCK FLUSH 100 UNIT/ML IV SOLN
500.0000 [IU] | Freq: Once | INTRAVENOUS | Status: AC | PRN
  Administered 2016-06-29: 500 [IU]

## 2016-06-29 MED ORDER — DIPHENHYDRAMINE HCL 25 MG PO CAPS
25.0000 mg | ORAL_CAPSULE | Freq: Once | ORAL | Status: AC
  Administered 2016-06-29: 25 mg via ORAL
  Filled 2016-06-29: qty 1

## 2016-06-30 ENCOUNTER — Other Ambulatory Visit
Admission: RE | Admit: 2016-06-30 | Discharge: 2016-06-30 | Disposition: A | Discharge status: Home / Self Care | Payer: Medicaid Other | Source: Ambulatory Visit | Attending: Nephrology | Admitting: Nephrology

## 2016-06-30 DIAGNOSIS — N186 End stage renal disease: Secondary | ICD-10-CM | POA: Insufficient documentation

## 2016-06-30 LAB — IGG, IGA, IGM
IGA: 16 mg/dL — AB (ref 87–352)
IGM, SERUM: 13 mg/dL — AB (ref 26–217)
IgG (Immunoglobin G), Serum: 434 mg/dL — ABNORMAL LOW (ref 700–1600)

## 2016-06-30 LAB — KAPPA/LAMBDA LIGHT CHAINS
Kappa free light chain: 112.8 mg/L — ABNORMAL HIGH (ref 3.3–19.4)
Kappa, lambda light chain ratio: 8.06 — ABNORMAL HIGH (ref 0.26–1.65)
Lambda free light chains: 14 mg/L (ref 5.7–26.3)

## 2016-06-30 LAB — POTASSIUM
POTASSIUM: 5 mmol/L (ref 3.5–5.1)
POTASSIUM: 2.8 mmol/L — AB (ref 3.5–5.1)

## 2016-07-05 ENCOUNTER — Encounter: Payer: Self-pay | Admitting: Emergency Medicine

## 2016-07-05 ENCOUNTER — Other Ambulatory Visit: Payer: Self-pay

## 2016-07-05 ENCOUNTER — Inpatient Hospital Stay
Admission: EM | Admit: 2016-07-05 | Discharge: 2016-07-07 | DRG: 640 | Disposition: A | Discharge status: Home / Self Care | Payer: Self-pay | Attending: Internal Medicine | Admitting: Internal Medicine

## 2016-07-05 DIAGNOSIS — N2581 Secondary hyperparathyroidism of renal origin: Secondary | ICD-10-CM | POA: Diagnosis present

## 2016-07-05 DIAGNOSIS — E119 Type 2 diabetes mellitus without complications: Secondary | ICD-10-CM

## 2016-07-05 DIAGNOSIS — N186 End stage renal disease: Secondary | ICD-10-CM

## 2016-07-05 DIAGNOSIS — Z794 Long term (current) use of insulin: Secondary | ICD-10-CM

## 2016-07-05 DIAGNOSIS — I120 Hypertensive chronic kidney disease with stage 5 chronic kidney disease or end stage renal disease: Secondary | ICD-10-CM | POA: Diagnosis present

## 2016-07-05 DIAGNOSIS — C9002 Multiple myeloma in relapse: Secondary | ICD-10-CM | POA: Diagnosis present

## 2016-07-05 DIAGNOSIS — I10 Essential (primary) hypertension: Secondary | ICD-10-CM

## 2016-07-05 DIAGNOSIS — E875 Hyperkalemia: Secondary | ICD-10-CM

## 2016-07-05 DIAGNOSIS — Z992 Dependence on renal dialysis: Secondary | ICD-10-CM

## 2016-07-05 DIAGNOSIS — Z8249 Family history of ischemic heart disease and other diseases of the circulatory system: Secondary | ICD-10-CM

## 2016-07-05 DIAGNOSIS — M1990 Unspecified osteoarthritis, unspecified site: Secondary | ICD-10-CM | POA: Diagnosis present

## 2016-07-05 DIAGNOSIS — D631 Anemia in chronic kidney disease: Secondary | ICD-10-CM | POA: Diagnosis present

## 2016-07-05 DIAGNOSIS — E1122 Type 2 diabetes mellitus with diabetic chronic kidney disease: Secondary | ICD-10-CM | POA: Diagnosis present

## 2016-07-05 LAB — CBC
HCT: 36.2 % (ref 35.0–47.0)
HCT: 31.8 % — ABNORMAL LOW (ref 35.0–47.0)
Hemoglobin: 12 g/dL (ref 12.0–16.0)
Hemoglobin: 10.6 g/dL — ABNORMAL LOW (ref 12.0–16.0)
MCH: 33.7 pg (ref 26.0–34.0)
MCH: 33.6 pg (ref 26.0–34.0)
MCHC: 33.1 g/dL (ref 32.0–36.0)
MCHC: 33.4 g/dL (ref 32.0–36.0)
MCV: 101.9 fL — ABNORMAL HIGH (ref 80.0–100.0)
MCV: 100.6 fL — ABNORMAL HIGH (ref 80.0–100.0)
PLATELETS: 159 10*3/uL (ref 150–440)
Platelets: 183 10*3/uL (ref 150–440)
RBC: 3.56 MIL/uL — AB (ref 3.80–5.20)
RBC: 3.16 MIL/uL — AB (ref 3.80–5.20)
RDW: 18 % — AB (ref 11.5–14.5)
RDW: 17.4 % — ABNORMAL HIGH (ref 11.5–14.5)
WBC: 6.9 10*3/uL (ref 3.6–11.0)
WBC: 5.4 10*3/uL (ref 3.6–11.0)

## 2016-07-05 LAB — COMPREHENSIVE METABOLIC PANEL
ALBUMIN: 3.6 g/dL (ref 3.5–5.0)
ALK PHOS: 211 U/L — AB (ref 38–126)
ALT: 14 U/L (ref 14–54)
ANION GAP: 21 — AB (ref 5–15)
AST: 25 U/L (ref 15–41)
BUN: 121 mg/dL — ABNORMAL HIGH (ref 6–20)
CALCIUM: 5.9 mg/dL — AB (ref 8.9–10.3)
CO2: 18 mmol/L — ABNORMAL LOW (ref 22–32)
Chloride: 95 mmol/L — ABNORMAL LOW (ref 101–111)
Creatinine, Ser: 8.07 mg/dL — ABNORMAL HIGH (ref 0.44–1.00)
GFR, EST AFRICAN AMERICAN: 6 mL/min — AB (ref >60)
GFR, EST NON AFRICAN AMERICAN: 5 mL/min — AB (ref >60)
Glucose, Bld: 219 mg/dL — ABNORMAL HIGH (ref 65–99)
Potassium: >7.5 mmol/L (ref 3.5–5.1)
Sodium: 134 mmol/L — ABNORMAL LOW (ref 135–145)
TOTAL PROTEIN: 6.3 g/dL — AB (ref 6.5–8.1)
Total Bilirubin: 1 mg/dL (ref 0.3–1.2)

## 2016-07-05 LAB — BASIC METABOLIC PANEL
Anion gap: 15 (ref 5–15)
BUN: 28 mg/dL — AB (ref 6–20)
CHLORIDE: 95 mmol/L — AB (ref 101–111)
CO2: 28 mmol/L (ref 22–32)
Calcium: 7 mg/dL — ABNORMAL LOW (ref 8.9–10.3)
Creatinine, Ser: 3.35 mg/dL — ABNORMAL HIGH (ref 0.44–1.00)
GFR calc Af Amer: 17 mL/min — ABNORMAL LOW (ref >60)
GFR calc non Af Amer: 15 mL/min — ABNORMAL LOW (ref >60)
GLUCOSE: 268 mg/dL — AB (ref 65–99)
POTASSIUM: 4.1 mmol/L (ref 3.5–5.1)
Sodium: 138 mmol/L (ref 135–145)

## 2016-07-05 LAB — GLUCOSE, CAPILLARY: GLUCOSE-CAPILLARY: 240 mg/dL — AB (ref 65–99)

## 2016-07-05 LAB — MAGNESIUM: MAGNESIUM: 2.2 mg/dL (ref 1.7–2.4)

## 2016-07-05 MED ORDER — ONDANSETRON HCL 4 MG/2ML IJ SOLN
4.0000 mg | Freq: Four times a day (QID) | INTRAMUSCULAR | Status: DC | PRN
  Administered 2016-07-07: 4 mg via INTRAVENOUS
  Filled 2016-07-05: qty 2

## 2016-07-05 MED ORDER — SODIUM CHLORIDE 0.9% FLUSH
3.0000 mL | Freq: Two times a day (BID) | INTRAVENOUS | Status: DC
  Administered 2016-07-05: 3 mL via INTRAVENOUS

## 2016-07-05 MED ORDER — CITALOPRAM HYDROBROMIDE 20 MG PO TABS
20.0000 mg | ORAL_TABLET | Freq: Every day | ORAL | Status: DC
  Administered 2016-07-05 – 2016-07-06 (×2): 20 mg via ORAL
  Filled 2016-07-05 (×2): qty 1

## 2016-07-05 MED ORDER — OXYCODONE HCL 5 MG PO TABS: 10.0000 mg | ORAL_TABLET | ORAL | Status: DC | PRN

## 2016-07-05 MED ORDER — CALCIUM GLUCONATE 10 % IV SOLN: 1.0000 g | Freq: Once | INTRAVENOUS | Status: DC

## 2016-07-05 MED ORDER — HEPARIN SODIUM (PORCINE) 5000 UNIT/ML IJ SOLN
5000.0000 [IU] | Freq: Three times a day (TID) | INTRAMUSCULAR | Status: DC
  Administered 2016-07-05 – 2016-07-07 (×5): 5000 [IU] via SUBCUTANEOUS
  Filled 2016-07-05 (×6): qty 1

## 2016-07-05 MED ORDER — VERAPAMIL HCL ER 120 MG PO TBCR
120.0000 mg | EXTENDED_RELEASE_TABLET | Freq: Every day | ORAL | Status: DC
  Administered 2016-07-05 – 2016-07-06 (×2): 120 mg via ORAL
  Filled 2016-07-05 (×2): qty 1

## 2016-07-05 MED ORDER — SODIUM CHLORIDE 0.9 % IV SOLN: 250.0000 mL | INTRAVENOUS | Status: DC | PRN

## 2016-07-05 MED ORDER — SODIUM CHLORIDE 0.9% FLUSH
3.0000 mL | Freq: Two times a day (BID) | INTRAVENOUS | Status: DC
  Administered 2016-07-05 – 2016-07-07 (×3): 3 mL via INTRAVENOUS

## 2016-07-05 MED ORDER — CALCIUM GLUCONATE 10 % IV SOLN
INTRAVENOUS | Status: AC
  Administered 2016-07-05: 1 g via INTRAVENOUS
  Filled 2016-07-05: qty 10

## 2016-07-05 MED ORDER — INSULIN ASPART 100 UNIT/ML ~~LOC~~ SOLN
0.0000 [IU] | Freq: Three times a day (TID) | SUBCUTANEOUS | Status: DC
  Administered 2016-07-06: 3 [IU] via SUBCUTANEOUS
  Administered 2016-07-06: 2 [IU] via SUBCUTANEOUS
  Filled 2016-07-05: qty 3
  Filled 2016-07-05: qty 2

## 2016-07-05 MED ORDER — CALCIUM GLUCONATE 10 % IV SOLN
1.0000 g | Freq: Once | INTRAVENOUS | Status: AC
  Administered 2016-07-05: 1 g via INTRAVENOUS
  Filled 2016-07-05: qty 10

## 2016-07-05 MED ORDER — CALCIUM GLUCONATE 10 % IV SOLN
1.0000 g | Freq: Once | INTRAVENOUS | Status: AC
  Administered 2016-07-05: 1 g via INTRAVENOUS

## 2016-07-05 MED ORDER — SODIUM CHLORIDE 0.9% FLUSH: 3.0000 mL | INTRAVENOUS | Status: DC | PRN

## 2016-07-05 MED ORDER — ONDANSETRON HCL 4 MG PO TABS: 4.0000 mg | ORAL_TABLET | Freq: Four times a day (QID) | ORAL | Status: DC | PRN

## 2016-07-05 MED ORDER — ALBUTEROL SULFATE (2.5 MG/3ML) 0.083% IN NEBU: 2.5000 mg | INHALATION_SOLUTION | RESPIRATORY_TRACT | Status: DC | PRN

## 2016-07-05 MED ORDER — ACETAMINOPHEN 650 MG RE SUPP: 650.0000 mg | Freq: Four times a day (QID) | RECTAL | Status: DC | PRN

## 2016-07-05 MED ORDER — INSULIN ASPART 100 UNIT/ML ~~LOC~~ SOLN
0.0000 [IU] | Freq: Every day | SUBCUTANEOUS | Status: DC
  Administered 2016-07-05: 2 [IU] via SUBCUTANEOUS
  Filled 2016-07-05: qty 2

## 2016-07-05 MED ORDER — SENNOSIDES-DOCUSATE SODIUM 8.6-50 MG PO TABS: 1.0000 | ORAL_TABLET | Freq: Every evening | ORAL | Status: DC | PRN

## 2016-07-05 MED ORDER — INSULIN DETEMIR 100 UNIT/ML ~~LOC~~ SOLN
5.0000 [IU] | Freq: Every day | SUBCUTANEOUS | Status: DC
  Administered 2016-07-05 – 2016-07-07 (×3): 5 [IU] via SUBCUTANEOUS
  Filled 2016-07-05 (×3): qty 0.05

## 2016-07-05 MED ORDER — ACETAMINOPHEN 325 MG PO TABS: 650.0000 mg | ORAL_TABLET | Freq: Four times a day (QID) | ORAL | Status: DC | PRN

## 2016-07-05 MED ORDER — PROCHLORPERAZINE MALEATE 10 MG PO TABS
10.0000 mg | ORAL_TABLET | Freq: Four times a day (QID) | ORAL | Status: DC | PRN
  Filled 2016-07-05: qty 1

## 2016-07-05 NOTE — ED Notes (Signed)
Pt states she is feeling a little better, no more emesis, forehead no longer sweaty. Pt lying on stretcher, blanket covering legs.

## 2016-07-05 NOTE — ED Notes (Signed)
Pt taken to dialysis by this RN on monitor. Pt left in care of dialysis RN's.

## 2016-07-05 NOTE — Progress Notes (Signed)
[facility]  ROUNDING NOTE   Subjective:   [name] admitted to [facility] on 07/05/2016 for Cancer patient - poss high potassium   Patient's potassium >7.5. Sinus bradycardia. She complains of nausea/vomiting and poor PO intake.  Nephrology consulted for emergent hemodialysis.   Objective:  Vital signs in last 24 hours:  Temp:  [97.5 F (36.4 C)-97.7 F (36.5 C)] 97.7 F (36.5 C) (04/30 1350) Pulse Rate:  [45-83] 83 (04/30 1430) Resp:  [17-23] 17 (04/30 1430) BP: (93-170)/(41-76) 170/76 (04/30 1430) SpO2:  [98 %-100 %] 100 % (04/30 1430) Weight:  [45.9 kg (101 lb 3.1 oz)-46 kg (101 lb 6.4 oz)] 45.9 kg (101 lb 3.1 oz) (04/30 1350)  Weight change:  Filed Weights   07/05/16 1022 07/05/16 1350  Weight: 46 kg (101 lb 6.4 oz) 45.9 kg (101 lb 3.1 oz)    Intake/Output: No intake/output data recorded.   Intake/Output this shift:  No intake/output data recorded.  Physical Exam: General: Critically ill  Head: Normocephalic, atraumatic. Moist oral mucosal membranes  Eyes: Anicteric, PERRL  Neck: Supple, trachea midline  Lungs:  Clear to auscultation  Heart: Regular rate and rhythm  Abdomen:  Soft, nontender,   Extremities: no peripheral edema.  Neurologic: Nonfocal, moving all four extremities  Skin: No lesions  Access: Left arm AVF    Basic Metabolic Panel:  Recent Labs Lab 06/29/16 0829 06/30/16 1230 06/30/16 1500 07/05/16 1159  NA 133*  --   --  134*  K 5.3* 5.0 2.8* >7.5*  CL 91*  --   --  95*  CO2 24  --   --  18*  GLUCOSE 281*  --   --  219*  BUN 62*  --   --  121*  CREATININE 4.51*  --   --  8.07*  CALCIUM 6.1*  --   --  5.9*    Liver Function Tests:  Recent Labs Lab 06/29/16 0829 07/05/16 1159  AST 29 25  ALT 15 14  ALKPHOS 286* 211*  BILITOT 0.6 1.0  PROT 6.7 6.3*  ALBUMIN 3.7 3.6   No results for input(s): LIPASE, AMYLASE in the last 168 hours. No results for input(s): AMMONIA in the last 168  hours.  CBC:  Recent Labs Lab 06/29/16 0829 07/05/16 1159  WBC 3.9 6.9  NEUTROABS 2.6  --   HGB 11.9* 12.0  HCT 35.1 36.2  MCV 99.2 101.9*  PLT 195 183    Cardiac Enzymes: No results for input(s): CKTOTAL, CKMB, CKMBINDEX, TROPONINI in the last 168 hours.  BNP: Invalid input(s): POCBNP  CBG: No results for input(s): GLUCAP in the last 168 hours.  Microbiology: Results for orders placed or performed during the hospital encounter of 06/20/16  Blood culture (routine x 2)     Status: None   Collection Time: 06/20/16  4:41 PM  Result Value Ref Range Status   Specimen Description BLOOD rt forarm  Final   Special Requests NONE  Final   Culture NO GROWTH 5 DAYS  Final   Report Status 06/25/2016 FINAL  Final  Blood culture (routine x 2)     Status: None   Collection Time: 06/20/16  4:41 PM  Result Value Ref Range Status   Specimen Description BLOOD  port  Final   Special Requests NONE  Final   Culture NO GROWTH 5 DAYS  Final   Report Status 06/25/2016 FINAL  Final  MRSA PCR Screening     Status: None   Collection Time:  06/20/16  8:02 PM  Result Value Ref Range Status   MRSA by PCR NEGATIVE NEGATIVE Final    Comment:        The GeneXpert MRSA Assay (FDA approved for NASAL specimens only), is one component of a comprehensive MRSA colonization surveillance program. It is not intended to diagnose MRSA infection nor to guide or monitor treatment for MRSA infections.     Coagulation Studies: No results for input(s): LABPROT, INR in the last 72 hours.  Urinalysis: No results for input(s): COLORURINE, LABSPEC, PHURINE, GLUCOSEU, HGBUR, BILIRUBINUR, KETONESUR, PROTEINUR, UROBILINOGEN, NITRITE, LEUKOCYTESUR in the last 72 hours.  Invalid input(s): APPERANCEUR    Imaging: No results found.   Medications:       Assessment/ Plan:  [name] is a 51 y.o. Hispanic female Spanish speaking only with diabetes mellitus type II, hypertension, multiple  myeloma, ESRD on hemodialysis.   [place]    1. End Stage Renal Disease: with hyperkalemia:  ESRD secondary to multiple myeloma Emergent hemodialysis today. Orders prepared. 1K bath.  - Continue MWF schedule - Consult vascular for evaluation for recirculation.   2.  Anemia of chronic kidney disease: hemoglobin 12  3. Hypertension: elevated. With junctional rhythm.   4. Secondary Hyperparathyroidism with hypocalcemia: outpatient PTH 968, outpatient calcium 7.1. Phosphorus at goal.  - outpatient binder is [place].  5. Multiple myeloma on chemotherapy:  - Followed by Dr. Grayland Keller.    LOS: 0 Davon Abdelaziz 4/30/20183:28 PM

## 2016-07-05 NOTE — ED Notes (Signed)
Spanish interpreter requested for Dr. Bridgett Larsson. Dialysis notified that pt will down shortly.

## 2016-07-05 NOTE — Progress Notes (Signed)
Hd completed.1017ml  net uf removed

## 2016-07-05 NOTE — ED Notes (Signed)
Date and time results received: 07/05/16 1253   Test: potassium Critical Value: 8.8  Test: calcium Critical value: 5.9   Name of Provider Notified: Dr. Corky Downs

## 2016-07-05 NOTE — ED Triage Notes (Signed)
Pt currently being treated for cancer, last tx was Tuesday. Previous visit to ED potassium was high. Pt also recvs dialysis.  Pt with port that cannot be accessed out in triage.  Pt will wait in subwait.

## 2016-07-05 NOTE — ED Provider Notes (Addendum)
[facility] Emergency Department Provider Note   ____________________________________________    I have reviewed the triage vital signs and the nursing notes.   HISTORY  Chief Complaint Nausea and Emesis     HPI Jamie Keller is a 51 y.o. female who presents for nausea. Patient has a history of end-stage renal disease, diabetes, hypertension and multiple myeloma for which she is receiving chemotherapy. She notes she feels similar to how she did when she needed urgent dialysis approximately 2 weeks ago. Reports compliance with dialysis.   Past Medical History:  Diagnosis Date  . Anemia   . Anxiety   . Chronic kidney disease    DIALYSIS  . Depression   . Diabetes mellitus without complication ([place])   . Dialysis patient ([place])    Mon, Wed, Fri  . History of chemotherapy    chemo on Tuesday and Wednesday  . Hypertension   . Multiple myeloma ([place])   . Neuropathy associated with cancer ([place])   . Osteoarthritis   . Pancreatitis   . Pneumonia    September 2017, [facility]  . Post-menopausal 2014  . Sepsis [facility])     Patient Active Problem List   Diagnosis Date Noted  . Hypocalcemia   . Acute on chronic renal failure ([place]) 06/20/2016  . Abnormal EKG 06/20/2016  . Vomiting 06/20/2016  . Respiratory failure ([place]) 05/25/2016  . Respiratory distress   . Multiple myeloma in relapse ([place]) 05/01/2016  . Influenza B 04/02/2016  . Pulmonary edema 04/01/2016  . Hyperkalemia 04/01/2016  . Epigastric pain   . Atypical pneumonia 12/26/2015  . Immunocompromised ([place]) 12/26/2015  . Sepsis ([place]) 12/01/2015  . HCAP (healthcare-associated pneumonia) 12/01/2015  . End stage renal disease ([place]) 12/01/2015  . HTN (hypertension) 12/01/2015  . Depression 12/01/2015  . Acute respiratory failure ([place]) 11/14/2015  . Acute renal failure ([place])   . Multiple myeloma ([place]) 09/06/2015  . Post-menopausal   . Bulging lumbar disc 05/28/2014    Past Surgical  History:  Procedure Laterality Date  . AV FISTULA PLACEMENT Left 02/20/2016   Procedure: INSERTION OF ARTERIOVENOUS (AV) GORE-TEX GRAFT ARM;  Surgeon: Katha Cabal, MD;  Location: [facility] ORS;  Service: Vascular;  Laterality: Left;  . CESAREAN SECTION     x2  . dialysis catheter placement    . PERIPHERAL VASCULAR CATHETERIZATION N/A 11/19/2015   Procedure: Dialysis/Perma Catheter Insertion;  Surgeon: Algernon Huxley, MD;  Location: [place] [facility];  Service: Cardiovascular;  Laterality: N/A;  . PERIPHERAL VASCULAR CATHETERIZATION Left 03/10/2016   Procedure: Thrombectomy;  Surgeon: Katha Cabal, MD;  Location: [place] [facility] LAB;  Service: Cardiovascular;  Laterality: Left;  . PERIPHERAL VASCULAR CATHETERIZATION N/A 03/31/2016   Procedure: Dialysis/Perma Catheter Removal;  Surgeon: Katha Cabal, MD;  Location: [place] [facility] LAB;  Service: Cardiovascular;  Laterality: N/A;  . PORTACATH PLACEMENT    . TUBAL LIGATION      Prior to Admission medications   Medication Sig Start Date End Date Taking? Authorizing Provider  citalopram (CELEXA) 20 MG tablet Take 1 tablet (20 mg total) by mouth daily. 04/13/16   Lloyd Huger, MD  Insulin Detemir (LEVEMIR FLEXPEN) 100 UNIT/ML Pen Inject 5 Units into the skin daily at 10 pm.    Historical Provider, MD  Insulin Pen Needle (NOVOFINE) 30G X 8 MM MISC Inject 10 each into the skin as needed. 04/02/16   Hillary Bow, MD  Oxycodone HCl 10 MG TABS Take 1 tablet (10 mg  total) by mouth every 4 (four) hours as needed (take 1 tablet by mouth every 4 - 6 hours as needed for severe pain). 06/29/16   Lloyd Huger, MD  prochlorperazine (COMPAZINE) 10 MG tablet Take 1 tablet (10 mg total) by mouth every 6 (six) hours as needed for nausea or vomiting. 06/29/16   Lloyd Huger, MD  verapamil (CALAN-SR) 120 MG CR tablet Take 120 mg by mouth daily.    Historical Provider, MD     Allergies Patient has no known allergies.  Family History    Problem Relation Age of Onset  . Hypercholesterolemia Mother   . Hypertension Mother   . Hypertension Father     Social History Social History  Substance Use Topics  . Smoking status: Never Smoker  . Smokeless tobacco: Never Used  . Alcohol use No    Review of Systems  Constitutional: No fevers reported Eyes: No visual changes.  ENT: No sore throat. Cardiovascular: Denies chest pain.No palpitations Respiratory: Denies shortness of breath. Gastrointestinal: No abdominal pain.   Genitourinary: negative for rash Musculoskeletal: Negative for back pain. Skin: Negative for rash. Neurological: Negative for headaches    ____________________________________________   PHYSICAL EXAM:  VITAL SIGNS: ED Triage Vitals  Enc Vitals Group     BP 07/05/16 1021 (!) 113/58     Pulse Rate 07/05/16 1021 (!) 49     Resp 07/05/16 1021 18     Temp 07/05/16 1021 97.5 F (36.4 C)     Temp Source 07/05/16 1021 Oral     SpO2 07/05/16 1021 100 %     Weight 07/05/16 1022 101 lb 6.4 oz (46 kg)     Height --      Head Circumference --      Peak Flow --      Pain Score --      Pain Loc --      Pain Edu? --      Excl. in [place]? --     Constitutional: Alert and oriented. No acute distress.  Eyes: Conjunctivae are normal.   Nose: No congestion/rhinnorhea. Mouth/Throat: Mucous membranes are moist.    Cardiovascular: bradycardia, regular rhythm. Grossly normal heart sounds.  Good peripheral circulation. Respiratory: Normal respiratory effort.  No retractions. Lungs CTAB. Gastrointestinal: Soft and nontender. No distention.  No CVA tenderness. Genitourinary: deferred Musculoskeletal: .  Warm and well perfused Neurologic:  Normal speech and language. No gross focal neurologic deficits are appreciated.  Skin:  Skin is warm, dry and intact. No rash noted. Psychiatric: Mood and affect are normal. Speech and behavior are normal.  ____________________________________________   LABS (all labs  ordered are listed, but only abnormal results are displayed)  Labs Reviewed  CBC - Abnormal; Notable for the following:       Result Value   RBC 3.56 (*)    MCV 101.9 (*)    RDW 18.0 (*)    All other components within normal limits  COMPREHENSIVE METABOLIC PANEL   ____________________________________________  EKG  ED ECG REPORT I, Lavonia Drafts, the attending physician, personally viewed and interpreted this ECG.   Date: 07/05/2016  EKG Time: 11:53 AM  Rate: 49  Rhythm: Junctional  Axis: Normal  Intervals:nonspecific intraventricular conduction delay  ST&T Change: non-specific  ____________________________________________  RADIOLOGY  none ____________________________________________   PROCEDURES  Procedure(s) performed: No    Critical Care performed: yes  CRITICAL CARE Performed by: Lavonia Drafts   Total critical care time: 30 minutes  Critical care time was  exclusive of separately billable procedures and treating other patients.  Critical care was necessary to treat or prevent imminent or life-threatening deterioration.  Critical care was time spent personally by me on the following activities: development of treatment plan with patient and/or surrogate as well as nursing, discussions with consultants, evaluation of patient's response to treatment, examination of patient, obtaining history from patient or surrogate, ordering and performing treatments and interventions, ordering and review of laboratory studies, ordering and review of radiographic studies, pulse oximetry and re-evaluation of patient's condition.  ____________________________________________   INITIAL IMPRESSION / ASSESSMENT AND PLAN / ED COURSE  Pertinent labs & imaging results that were available during my care of the patient were reviewed by me and considered in my medical decision making (see chart for details).  ----------------------------------------- 12:12 PM on  07/05/2016 -----------------------------------------  Patient presents with no acute complaints however she reports she feels similar to how she did when she had hyperkalemia recently and required urgent dialysis.   Just received EKG which is significantly abnormal with a junctional rhythm. I ordered IV calcium presumptively although have not yet received cmp results.  ----------------------------------------- 12:20 PM on 07/05/2016 ----------------------------------------- I spoke to nephrology Dr. Abigail Butts of hyperkalemia and he saw the patient. He states they will prepare for dialysis if necessary.   ----------------------------------------- 12:23 PM on 07/05/2016 -----------------------------------------  I called the lab and they report they have to dilute the potassium which means that it will be greater than 7.5.   Spoke with Dr. Abigail Butts they will admit for urgent dialysis, recommends holding off on insulin and glucose given they will dialyze stat.   ----------------------------------------- 12:57 PM on 07/05/2016 ----------------------------------------- D/w [place] he will expedite dialysis    ____________________________________________   FINAL CLINICAL IMPRESSION(S) / ED DIAGNOSES  Final diagnoses:  Acute hyperkalemia      NEW MEDICATIONS STARTED DURING THIS VISIT:  New Prescriptions   No medications on file     Note:  This document was prepared using Dragon voice recognition software and may include unintentional dictation errors.    Lavonia Drafts, MD 07/05/16 1231    Lavonia Drafts, MD 07/05/16 1302

## 2016-07-05 NOTE — ED Notes (Signed)
Pt vomiting small amounts, face broke out in sweat. Dr. Corky Downs notified. Pt given water, told to take one-two sips.

## 2016-07-05 NOTE — ED Notes (Signed)
Dr. Chen at bedside.

## 2016-07-05 NOTE — ED Notes (Signed)
Pt wanted port accessed in R chest. Pt states being treated for multiple myeloma. Also has dialysis MWF, has missed today. Pt states 2 weeks ago potassium was elevated. Pt states nausea and vomiting, denies blood in it. Denies diarrhea or fever.

## 2016-07-05 NOTE — Consult Note (Signed)
[facility] Consult Note  MRN : [id number]  Jamie Keller is a 51 y.o. (04-27-1965) female who presents with chief complaint of  Chief Complaint  Patient presents with  . Nausea  . Emesis  .  History of Present Illness: I am asked to see the patient by Dr. Juleen China for a poorly functioning AVG.  She has had this for about 4-5 months.  Denies pain at site or at hand.  No prolonged bleeding that she knows of.  Had to be admitted today for severe hyperkalemia despite a low outpatient K bath and regular HD.  This caused sinus bradycardia as well as N/V.  No other complaints.  No fever or chills.  No chest pain or shortness of breath.  No current facility-administered medications for this encounter.    Current Outpatient Prescriptions  Medication Sig Dispense Refill  . citalopram (CELEXA) 20 MG tablet Take 1 tablet (20 mg total) by mouth daily. 30 tablet 5  . Insulin Detemir (LEVEMIR FLEXPEN) 100 UNIT/ML Pen Inject 5 Units into the skin daily at 10 pm.    . Oxycodone HCl 10 MG TABS Take 1 tablet (10 mg total) by mouth every 4 (four) hours as needed (take 1 tablet by mouth every 4 - 6 hours as needed for severe pain). 60 tablet 0  . prochlorperazine (COMPAZINE) 10 MG tablet Take 1 tablet (10 mg total) by mouth every 6 (six) hours as needed for nausea or vomiting. 30 tablet 1  . verapamil (CALAN-SR) 120 MG CR tablet Take 120 mg by mouth daily.    . Insulin Pen Needle (NOVOFINE) 30G X 8 MM MISC Inject 10 each into the skin as needed. 5 packet 0   Facility-Administered Medications Ordered in Other Encounters  Medication Dose Route Frequency Provider Last Rate Last Dose  . LORazepam (ATIVAN) injection 1 mg  1 mg Intravenous Once Lloyd Huger, MD        Past Medical History:  Diagnosis Date  . Anemia   . Anxiety   . Chronic kidney disease    DIALYSIS  . Depression   . Diabetes mellitus without complication ([place])   . Dialysis patient ([place])    Mon, Wed, Fri  . History of chemotherapy    chemo on Tuesday and Wednesday  . Hypertension   . Multiple myeloma ([place])   . Neuropathy associated with cancer ([place])   . Osteoarthritis   . Pancreatitis   . Pneumonia    September 2017, [facility]  . Post-menopausal 2014  . Sepsis [facility])     Past Surgical History:  Procedure Laterality Date  . AV FISTULA PLACEMENT Left 02/20/2016   Procedure: INSERTION OF ARTERIOVENOUS (AV) GORE-TEX GRAFT ARM;  Surgeon: Katha Cabal, MD;  Location: [facility] ORS;  Service: Vascular;  Laterality: Left;  . CESAREAN SECTION     x2  . dialysis catheter placement    . PERIPHERAL VASCULAR CATHETERIZATION N/A 11/19/2015   Procedure: Dialysis/Perma Catheter Insertion;  Surgeon: Algernon Huxley, MD;  Location: [place] [facility] LAB;  Service: Cardiovascular;  Laterality: N/A;  . PERIPHERAL VASCULAR CATHETERIZATION Left 03/10/2016   Procedure: Thrombectomy;  Surgeon: Katha Cabal, MD;  Location: [place] [facility] LAB;  Service: Cardiovascular;  Laterality: Left;  . PERIPHERAL VASCULAR CATHETERIZATION N/A 03/31/2016   Procedure: Dialysis/Perma Catheter Removal;  Surgeon: Katha Cabal, MD;  Location: [place] [facility] LAB;  Service: Cardiovascular;  Laterality: N/A;  . PORTACATH PLACEMENT    . TUBAL LIGATION  Social History Social History  Substance Use Topics  . Smoking status: Never Smoker  . Smokeless tobacco: Never Used  . Alcohol use No  No IVDU  Family History Family History  Problem Relation Age of Onset  . Hypercholesterolemia Mother   . Hypertension Mother   . Hypertension Father   No bleeding or clotting disorders  No Known Allergies   REVIEW OF SYSTEMS (Negative unless checked)  Constitutional: [] Weight loss  [] Fever  [] Chills Cardiac: [] Chest pain   [] Chest pressure   [] Palpitations   [] Shortness of breath when laying flat   [] Shortness of breath at rest   [] Shortness of breath with exertion. Vascular:  [] Pain in legs with walking   [] Pain  in legs at rest   [] Pain in legs when laying flat   [] Claudication   [] Pain in feet when walking  [] Pain in feet at rest  [] Pain in feet when laying flat   [] History of DVT   [] Phlebitis   [x] Swelling in legs   [] Varicose veins   [] Non-healing ulcers Pulmonary:   [] Uses home oxygen   [] Productive cough   [] Hemoptysis   [] Wheeze  [] COPD   [] Asthma Neurologic:  [] Dizziness  [] Blackouts   [] Seizures   [] History of stroke   [] History of TIA  [] Aphasia   [] Temporary blindness   [] Dysphagia   [] Weakness or numbness in arms   [] Weakness or numbness in legs Musculoskeletal:  [x] Arthritis   [] Joint swelling   [] Joint pain   [] Low back pain Hematologic:  [] Easy bruising  [] Easy bleeding   [] Hypercoagulable state   [] Anemic  [] Hepatitis Gastrointestinal:  [] Blood in stool   [] Vomiting blood  [] Gastroesophageal reflux/heartburn   [] Difficulty swallowing. Genitourinary:  [x] Chronic kidney disease   [] Difficult urination  [] Frequent urination  [] Burning with urination   [] Blood in urine Skin:  [] Rashes   [] Ulcers   [] Wounds Psychological:  [] History of anxiety   []  History of major depression.  Physical Examination  Vitals:   07/05/16 1350 07/05/16 1357 07/05/16 1430 07/05/16 1500  BP: (!) 144/58 (!) 117/44 (!) 170/76 (!) 178/86  Pulse: 76 78 83 82  Resp: (!) 23 (!) 21 17 19   Temp: 97.7 F (36.5 C)     TempSrc: Oral     SpO2: 99% 99% 100% 99%  Weight: 45.9 kg (101 lb 3.1 oz)      Body mass index is 19.76 kg/m. Gen:  Thin, NAD. Seen on HD. Head: [place]/AT, No temporalis wasting.  Ear/Nose/Throat: Hearing grossly intact, nares w/o erythema or drainage, oropharynx w/o Erythema/Exudate Eyes: Sclera non-icteric, conjunctiva clear Neck: Trachea midline.  No JVD.  Pulmonary:  Good air movement, respirations not labored, equal bilaterally.  Cardiac: RRR, normal S1, S2. Vascular: difficult to examine AVG as she is currently on HD.  Machine is running well with no alarms Vessel Right Left  Radial Palpable  Palpable                                   Gastrointestinal: soft, non-tender/non-distended.  Musculoskeletal: M/S 5/5 throughout.  Extremities without ischemic changes.  No deformity or atrophy. No edema. Neurologic: Sensation grossly intact in extremities.  Symmetrical.  Speech is fluent. Motor exam as listed above. Psychiatric: Judgment intact, Mood & affect appropriate for pt's clinical situation. Dermatologic: No rashes or ulcers noted.  No cellulitis or open wounds.       CBC Lab Results  Component Value Date   WBC  6.9 07/05/2016   HGB 12.0 07/05/2016   HCT 36.2 07/05/2016   MCV 101.9 (H) 07/05/2016   PLT 183 07/05/2016    BMET    Component Value Date/Time   NA 134 (L) 07/05/2016 1159   NA 137 06/27/2014 1344   K >7.5 (HH) 07/05/2016 1159   K 4.0 06/27/2014 1344   CL 95 (L) 07/05/2016 1159   CL 106 06/27/2014 1344   CO2 18 (L) 07/05/2016 1159   CO2 26 06/27/2014 1344   GLUCOSE 219 (H) 07/05/2016 1159   GLUCOSE 83 06/27/2014 1344   BUN 121 (H) 07/05/2016 1159   BUN 27 (H) 06/27/2014 1344   CREATININE 8.07 (H) 07/05/2016 1159   CREATININE 1.36 (H) 06/27/2014 1344   CALCIUM 5.9 (LL) 07/05/2016 1159   CALCIUM 9.1 06/27/2014 1344   GFRNONAA 5 (L) 07/05/2016 1159   GFRNONAA 46 (L) 06/27/2014 1344   GFRAA 6 (L) 07/05/2016 1159   GFRAA 53 (L) 06/27/2014 1344   Estimated Creatinine Clearance: 5.9 mL/min (A) (by C-G formula based on SCr of 8.07 mg/dL (H)).  COAG Lab Results  Component Value Date   INR 1.27 02/17/2016   INR 1.51 01/14/2016    Radiology No results found.    Assessment/Plan 1. Hyperkalemia. Suspect recirculation of her flow from HD with the AVG.  Would recommend a shuntogram to be done on Wednesday 5/2.  Risks and benefits discussed 2. ESRD. Using AVG as the only access currently 3. DM. Stable on outpatient medications and blood glucose control important in reducing the progression of atherosclerotic disease. Also, involved in wound  healing. On appropriate medications. 4. HTN. Stable on outpatient medications and blood pressure control important in reducing the progression of atherosclerotic disease. On appropriate oral medications.    Leotis Pain, MD  07/05/2016 4:02 PM    This note was created with Dragon medical transcription system.  Any error is purely unintentional

## 2016-07-05 NOTE — ED Provider Notes (Signed)
Apparently hospitalist admission order is necessary prior to dialysis. Asked Dr. Bridgett Larsson to place admission order.   Lavonia Drafts, MD 07/05/16 [phone]

## 2016-07-05 NOTE — ED Notes (Signed)
Pt signed consent form for dialysis, awaiting orders from Nephrology before pt can do down to dialysis.

## 2016-07-05 NOTE — H&P (Signed)
[facility] [place] NAME: Jamie Keller    MR#:  [id number]  [place]:  28-Sep-1965  DATE OF ADMISSION:  07/05/2016  PRIMARY CARE PHYSICIAN: [name], MD   REQUESTING/REFERRING PHYSICIAN: Lavonia Drafts, MD  CHIEF COMPLAINT:   Chief Complaint  Patient presents with  . Nausea  . Emesis   Nausea and vomiting since last night. HISTORY OF PRESENT ILLNESS:  Jamie Keller  is a 51 y.o. female with a known history of hypertension, diabetes, ESRD on hemodialysis and multiple myeloma on chemotherapy. The patient presented to the ED with the above chief complaints.She is compliant to hemodialysis. She denies any chest pain or palpitation or leg swelling.her potassium is more than 7.5. EKG show T-wave changes. Nephrology suggested emergent hemodialysis now.  PAST MEDICAL HISTORY:   Past Medical History:  Diagnosis Date  . Anemia   . Anxiety   . Chronic kidney disease    DIALYSIS  . Depression   . Diabetes mellitus without complication ([place])   . Dialysis patient ([place])    Mon, Wed, Fri  . History of chemotherapy    chemo on Tuesday and Wednesday  . Hypertension   . Multiple myeloma ([place])   . Neuropathy associated with cancer ([place])   . Osteoarthritis   . Pancreatitis   . Pneumonia    September 2017, [facility]  . Post-menopausal 2014  . Sepsis ([place])     PAST SURGICAL HISTORY:   Past Surgical History:  Procedure Laterality Date  . AV FISTULA PLACEMENT Left 02/20/2016   Procedure: INSERTION OF ARTERIOVENOUS (AV) GORE-TEX GRAFT ARM;  Surgeon: Katha Cabal, MD;  Location: [facility] ORS;  Service: Vascular;  Laterality: Left;  . CESAREAN SECTION     x2  . dialysis catheter placement    . PERIPHERAL VASCULAR CATHETERIZATION N/A 11/19/2015   Procedure: Dialysis/Perma Catheter Insertion;  Surgeon: Algernon Huxley, MD;  Location: [place] CV LAB;  Service: Cardiovascular;  Laterality: N/A;  . PERIPHERAL VASCULAR  CATHETERIZATION Left 03/10/2016   Procedure: Thrombectomy;  Surgeon: Katha Cabal, MD;  Location: [place] [facility] LAB;  Service: Cardiovascular;  Laterality: Left;  . PERIPHERAL VASCULAR CATHETERIZATION N/A 03/31/2016   Procedure: Dialysis/Perma Catheter Removal;  Surgeon: Katha Cabal, MD;  Location: [place] [facility] LAB;  Service: Cardiovascular;  Laterality: N/A;  . PORTACATH PLACEMENT    . TUBAL LIGATION      SOCIAL HISTORY:   Social History  Substance Use Topics  . Smoking status: Never Smoker  . Smokeless tobacco: Never Used  . Alcohol use No    FAMILY HISTORY:   Family History  Problem Relation Age of Onset  . Hypercholesterolemia Mother   . Hypertension Mother   . Hypertension Father     DRUG ALLERGIES:  No Known Allergies  REVIEW OF SYSTEMS:   Review of Systems  Constitutional: Positive for malaise/fatigue. Negative for chills and fever.  HENT: Negative for congestion.   Eyes: Negative for blurred vision and double vision.  Respiratory: Negative for cough, shortness of breath and stridor.   Cardiovascular: Negative for chest pain and leg swelling.  Gastrointestinal: Positive for nausea and vomiting. Negative for abdominal pain, blood in stool, diarrhea and melena.  Genitourinary: Negative for dysuria and hematuria.  Musculoskeletal: Negative for back pain.  Skin: Negative for itching and rash.  Neurological: Positive for dizziness and weakness. Negative for focal weakness, seizures, loss of consciousness and headaches.  Psychiatric/Behavioral: Negative for depression. The patient  is not nervous/anxious.     MEDICATIONS AT HOME:   Prior to Admission medications   Medication Sig Start Date End Date Taking? Authorizing Provider  citalopram (CELEXA) 20 MG tablet Take 1 tablet (20 mg total) by mouth daily. 04/13/16  Yes Lloyd Huger, MD  Insulin Detemir (LEVEMIR FLEXPEN) 100 UNIT/ML Pen Inject 5 Units into the skin daily at 10 pm.   Yes Historical  Provider, MD  Oxycodone HCl 10 MG TABS Take 1 tablet (10 mg total) by mouth every 4 (four) hours as needed (take 1 tablet by mouth every 4 - 6 hours as needed for severe pain). 06/29/16  Yes Lloyd Huger, MD  prochlorperazine (COMPAZINE) 10 MG tablet Take 1 tablet (10 mg total) by mouth every 6 (six) hours as needed for nausea or vomiting. 06/29/16  Yes Lloyd Huger, MD  verapamil (CALAN-SR) 120 MG CR tablet Take 120 mg by mouth daily.   Yes Historical Provider, MD  Insulin Pen Needle (NOVOFINE) 30G X 8 MM MISC Inject 10 each into the skin as needed. 04/02/16   Srikar Sudini, MD      VITAL SIGNS:  Blood pressure 138/60, pulse (!) 45, temperature 97.5 F (36.4 C), temperature source Oral, resp. rate (!) 23, weight 101 lb 6.4 oz (46 kg), last menstrual period 02/25/1995, SpO2 98 %.  PHYSICAL EXAMINATION:  Physical Exam  GENERAL:  51 y.o.-year-old patient lying in the bed with no acute distress.  EYES: Pupils equal, round, reactive to light and accommodation. No scleral icterus. Extraocular muscles intact.  HEENT: Head atraumatic, normocephalic. Oropharynx and nasopharynx clear.  NECK:  Supple, no jugular venous distention. No thyroid enlargement, no tenderness.  LUNGS: Normal breath sounds bilaterally, no wheezing, rales,rhonchi or crepitation. No use of accessory muscles of respiration.  CARDIOVASCULAR: S1, S2 normal. No murmurs, rubs, or gallops.  ABDOMEN: Soft, nontender, nondistended. Bowel sounds present. No organomegaly or mass.  EXTREMITIES: No pedal edema, cyanosis, or clubbing.  NEUROLOGIC: Cranial nerves II through XII are intact. Muscle strength 5/5 in all extremities. Sensation intact. Gait not checked.  PSYCHIATRIC: The patient is alert and oriented x 3.  SKIN: No obvious rash, lesion, or ulcer.   LABORATORY PANEL:   CBC  Recent Labs Lab 07/05/16 1159  WBC 6.9  HGB 12.0  HCT 36.2  PLT 183    ------------------------------------------------------------------------------------------------------------------  Chemistries   Recent Labs Lab 07/05/16 1159  NA 134*  K >7.5*  CL 95*  CO2 18*  GLUCOSE 219*  BUN 121*  CREATININE 8.07*  CALCIUM 5.9*  AST 25  ALT 14  ALKPHOS 211*  BILITOT 1.0   ------------------------------------------------------------------------------------------------------------------  Cardiac Enzymes No results for input(s): TROPONINI in the last 168 hours. ------------------------------------------------------------------------------------------------------------------  RADIOLOGY:  No results found.    IMPRESSION AND PLAN:   Hyperkalemia. The patient will be admitted.  K>7.5 with EKG changes. Emergent HD stat. f/u BMP after HD.  Hypocalcemia. Calcium gluconate IV. f/u Ca.  ESRD. HD now.  DM. levemir and SL.  Hypertension. Continue verapamil.  All the records are reviewed and case discussed with ED provider. Management plans discussed with the patient, her husband and they are in agreement.  CODE STATUS: Full code  TOTAL CRITICAL TIME TAKING CARE OF THIS PATIENT: 55 minutes.    Demetrios Loll M.D on 07/05/2016 at 1:30 PM  Between 7am to 6pm - Pager - [phone]  After 6pm go to [email or web address] - [facility]  Office  [phone]  CC: Primary  care physician; [name], MD   Note: This dictation was prepared with Dragon dictation along with smaller phrase technology. Any transcriptional errors that result from this process are unintentional.

## 2016-07-06 ENCOUNTER — Ambulatory Visit: Payer: Self-pay | Admitting: Oncology

## 2016-07-06 ENCOUNTER — Inpatient Hospital Stay: Payer: Self-pay

## 2016-07-06 ENCOUNTER — Inpatient Hospital Stay: Payer: Self-pay | Attending: Oncology

## 2016-07-06 DIAGNOSIS — R112 Nausea with vomiting, unspecified: Secondary | ICD-10-CM

## 2016-07-06 DIAGNOSIS — N186 End stage renal disease: Secondary | ICD-10-CM

## 2016-07-06 DIAGNOSIS — G629 Polyneuropathy, unspecified: Secondary | ICD-10-CM | POA: Insufficient documentation

## 2016-07-06 DIAGNOSIS — Z8719 Personal history of other diseases of the digestive system: Secondary | ICD-10-CM | POA: Insufficient documentation

## 2016-07-06 DIAGNOSIS — C9002 Multiple myeloma in relapse: Secondary | ICD-10-CM

## 2016-07-06 DIAGNOSIS — E119 Type 2 diabetes mellitus without complications: Secondary | ICD-10-CM

## 2016-07-06 DIAGNOSIS — Z794 Long term (current) use of insulin: Secondary | ICD-10-CM | POA: Insufficient documentation

## 2016-07-06 DIAGNOSIS — D649 Anemia, unspecified: Secondary | ICD-10-CM

## 2016-07-06 DIAGNOSIS — F329 Major depressive disorder, single episode, unspecified: Secondary | ICD-10-CM

## 2016-07-06 DIAGNOSIS — Z9221 Personal history of antineoplastic chemotherapy: Secondary | ICD-10-CM | POA: Insufficient documentation

## 2016-07-06 DIAGNOSIS — R5383 Other fatigue: Secondary | ICD-10-CM | POA: Insufficient documentation

## 2016-07-06 DIAGNOSIS — R630 Anorexia: Secondary | ICD-10-CM | POA: Insufficient documentation

## 2016-07-06 DIAGNOSIS — I129 Hypertensive chronic kidney disease with stage 1 through stage 4 chronic kidney disease, or unspecified chronic kidney disease: Secondary | ICD-10-CM

## 2016-07-06 DIAGNOSIS — M549 Dorsalgia, unspecified: Secondary | ICD-10-CM | POA: Insufficient documentation

## 2016-07-06 DIAGNOSIS — R531 Weakness: Secondary | ICD-10-CM | POA: Insufficient documentation

## 2016-07-06 DIAGNOSIS — F419 Anxiety disorder, unspecified: Secondary | ICD-10-CM | POA: Insufficient documentation

## 2016-07-06 DIAGNOSIS — M1990 Unspecified osteoarthritis, unspecified site: Secondary | ICD-10-CM

## 2016-07-06 DIAGNOSIS — E875 Hyperkalemia: Principal | ICD-10-CM

## 2016-07-06 DIAGNOSIS — G8929 Other chronic pain: Secondary | ICD-10-CM | POA: Insufficient documentation

## 2016-07-06 DIAGNOSIS — Z8701 Personal history of pneumonia (recurrent): Secondary | ICD-10-CM | POA: Insufficient documentation

## 2016-07-06 DIAGNOSIS — Z992 Dependence on renal dialysis: Secondary | ICD-10-CM

## 2016-07-06 DIAGNOSIS — E1165 Type 2 diabetes mellitus with hyperglycemia: Secondary | ICD-10-CM | POA: Insufficient documentation

## 2016-07-06 DIAGNOSIS — Z5112 Encounter for antineoplastic immunotherapy: Secondary | ICD-10-CM | POA: Insufficient documentation

## 2016-07-06 LAB — GLUCOSE, CAPILLARY
Glucose-Capillary: 81 mg/dL (ref 65–99)
Glucose-Capillary: 160 mg/dL — ABNORMAL HIGH (ref 65–99)
Glucose-Capillary: 242 mg/dL — ABNORMAL HIGH (ref 65–99)
Glucose-Capillary: 124 mg/dL — ABNORMAL HIGH (ref 65–99)

## 2016-07-06 LAB — CBC
HCT: 29.9 % — ABNORMAL LOW (ref 35.0–47.0)
Hemoglobin: 10 g/dL — ABNORMAL LOW (ref 12.0–16.0)
MCH: 33.8 pg (ref 26.0–34.0)
MCHC: 33.5 g/dL (ref 32.0–36.0)
MCV: 100.9 fL — ABNORMAL HIGH (ref 80.0–100.0)
PLATELETS: 160 10*3/uL (ref 150–440)
RBC: 2.96 MIL/uL — AB (ref 3.80–5.20)
RDW: 17.2 % — ABNORMAL HIGH (ref 11.5–14.5)
WBC: 5.2 10*3/uL (ref 3.6–11.0)

## 2016-07-06 LAB — BASIC METABOLIC PANEL
ANION GAP: 10 (ref 5–15)
BUN: 38 mg/dL — ABNORMAL HIGH (ref 6–20)
CALCIUM: 6.1 mg/dL — AB (ref 8.9–10.3)
CO2: 31 mmol/L (ref 22–32)
CREATININE: 3.91 mg/dL — AB (ref 0.44–1.00)
Chloride: 96 mmol/L — ABNORMAL LOW (ref 101–111)
GFR calc non Af Amer: 12 mL/min — ABNORMAL LOW (ref >60)
GFR, EST AFRICAN AMERICAN: 14 mL/min — AB (ref >60)
Glucose, Bld: 80 mg/dL (ref 65–99)
Potassium: 4.7 mmol/L (ref 3.5–5.1)
Sodium: 137 mmol/L (ref 135–145)

## 2016-07-06 MED ORDER — FERRIC CITRATE 1 GM 210 MG(FE) PO TABS
420.0000 mg | ORAL_TABLET | Freq: Three times a day (TID) | ORAL | Status: DC
  Administered 2016-07-06: 420 mg via ORAL
  Filled 2016-07-06 (×5): qty 2

## 2016-07-06 MED ORDER — OXYCODONE HCL 5 MG PO TABS: 10.0000 mg | ORAL_TABLET | ORAL | Status: DC | PRN

## 2016-07-06 MED ORDER — NEPRO/CARBSTEADY PO LIQD: 237.0000 mL | Freq: Two times a day (BID) | ORAL | Status: DC

## 2016-07-06 NOTE — Progress Notes (Signed)
Initial Nutrition Assessment  DOCUMENTATION CODES:   Severe malnutrition in context of chronic illness  INTERVENTION:  Provide Nepro Shake po BID, each supplement provides 425 kcal and 19 grams protein. Patient prefers berry flavor.  Recommend renal-appropriate multivitamin (Rena-vite) QHS.  Encouraged small, frequent meals on days patient has a poor appetite.   NUTRITION DIAGNOSIS:   Malnutrition (Severe) related to chronic illness (multiple myeloma on chemo, ESRD on HD) as evidenced by 23.4 percent weight loss over 11 months, severe depletion of body fat, severe depletion of muscle mass.  GOAL:   Patient will meet greater than or equal to 90% of their needs  MONITOR:   PO intake, Supplement acceptance, Labs, Weight trends, I & O's  REASON FOR ASSESSMENT:   Malnutrition Screening Tool    ASSESSMENT:   51 year old female with PMHx of HTN, multiple myeloma on chemotherapy, DM type 2, anxiety, depression, pancreatitis, ESRD on HD who presented with intractable nausea and vomiting for one day found to have acute hyperkalemia with EKG changes, malfunctioning AVG.   -Patient received cycle 6 of daratumumab on 06/29/2016. -Patient underwent emergent dialysis 4/30 (UF 1 L). -Per chart plan is for Dr. Lucky Cowboy to do fistulogram tomorrow.  Spoke with patient at bedside with [place] Lacretia Leigh [phone]). Patient known to this RD from previous admission. She reports her appetite is fine, she is just worried about potassium and phosphorus levels with dialysis. Patient reports she is eating three meals per day. At 8am she drinks Glucerna and eats approximately 5 sugar cookies. She reports she has to eat by 8 am to not get nauseas. At 11am she has 2 scrambled eggs with 1 cup beans, tortillas, and coffee. At 5pm she has meat (chicken or pork -serving size palm of her hand) or a sandwich. With that meal she also will have either regular milk or another Glucerna and eats most sugar  cookies. She also eats grapes, peaches, pears, and apples during the day. Although patient initially reported she has a good appetite, she later reported that she occasionally does not finish her meals. Patient reports she had N/V day PTA, but is improved now.  Patient reporting to this RD that she does not ever drink Boost Plus or Ensure Enlive, only Glucerna at home. She reports the drink she tried that she didn't like was the vanilla flavor one in a red box (likely Boost Plus). Her favorite flavor of ONS is chocolate, but Nepro not carried in chocolate. This RD brought her vanilla and berry flavors of Nepro. After trying both, patient reports she enjoys the berry flavor and is amenable to drinking BID.  UBW was 125 lbs in 08/2015. Patient has lost 29.2 lbs (23.4% body weight) over 11 months, which is significant for time frame. She is unsure of her current dry weight.   Meal Completion: 100% of breakfast and lunch today per chart  Medications reviewed and include: ferric citrate 420 mg TID with meals, Novolog sliding scale TID with meals and QHS, Levemir 5 units daily. Patient s/p calcium gluconate 1 gram IV on 4/30.  Labs reviewed: CBG 81-242, Potassium 4.7 (decreased from >7.5 on admission), Chloride 96, Calcium 6.1.   I/O: 530 ml UOP yesterday per chart  Nutrition-Focused physical exam completed. Findings are moderate-severe fat depletion, moderate-severe muscle depletion, and no edema.   Discussed with RN.  Diet Order:  Diet renal with fluid restriction Fluid restriction: 1200 mL Fluid; Room service appropriate? Yes; Fluid consistency: Thin Diet NPO time specified  Skin:  Reviewed, no issues  Last BM:  07/06/2016 - type 2 per chart  Height:   Ht Readings from Last 1 Encounters:  07/06/16 4' 11"  (1.499 m)    Weight:   Wt Readings from Last 1 Encounters:  07/06/16 95 lb 12.8 oz (43.5 kg)    Ideal Body Weight:  43.2 kg  BMI:  Body mass index is 19.35 kg/m.  Estimated  Nutritional Needs:   Kcal:  1305-1525 (30-35 kcal/kg)  Protein:  65-80 grams (1.5-1.8 grams/kg)  Fluid:  UOP + 1 L  EDUCATION NEEDS:   No education needs identified at this time  Willey Blade, MS, RD, LDN Pager: [phone] After Hours Pager: [phone]

## 2016-07-06 NOTE — Care Management (Signed)
Patient admitted for nausea and vomiting.  Patient has underlying multiple myeloma .  Chronic HD patient MWF at [place]. .  K noted to be >7.5.  [facility] liaison notified of admission. PCP [facility].  Patient obtains her medications from their pharmacy.  Cancer center provides medications assistance.  Patient was delivered walker on a previous admission.  Patient has been given [facility] and Medication Management on multiple previous admissions. Previous discussed medicaid applications via Medical interpreter. At that time patient stated that she had not followed through with social services. RNCM following

## 2016-07-06 NOTE — Progress Notes (Signed)
[facility]  ROUNDING NOTE   Subjective:   Hemodialysis treatment yesterday. Emergent due to hyperkalemia. Tolerated treatment well. UF of 1 liter.  Potassium at goal today.  Chemotherapy was scheduled for today.   History taken with assistance of Spanish Interpreter.   Objective:  Vital signs in last 24 hours:  Temp:  [97.5 F (36.4 C)-98.6 F (37 C)] 98.1 F (36.7 C) (05/01 0407) Pulse Rate:  [45-98] 82 (05/01 0407) Resp:  [13-23] 18 (05/01 0407) BP: (93-196)/(41-124) 132/57 (05/01 0407) SpO2:  [97 %-100 %] 98 % (05/01 0407) Weight:  [43.5 kg (95 lb 12.8 oz)-46 kg (101 lb 6.4 oz)] 43.5 kg (95 lb 12.8 oz) (05/01 0700)  Weight change:  Filed Weights   07/05/16 1022 07/05/16 1350 07/06/16 0700  Weight: 46 kg (101 lb 6.4 oz) 45.9 kg (101 lb 3.1 oz) 43.5 kg (95 lb 12.8 oz)    Intake/Output: I/O last 3 completed shifts: In: 77 [P.O.:60] Out: 1638 [Urine:530; Other:1000]   Intake/Output this shift:  No intake/output data recorded.  Physical Exam: General: Laying in bed  Head: Normocephalic, atraumatic. Moist oral mucosal membranes  Eyes: Anicteric, PERRL  Neck: Supple, trachea midline  Lungs:  Clear to auscultation  Heart: Regular rate and rhythm  Abdomen:  Soft, nontender,   Extremities: no peripheral edema.  Neurologic: Nonfocal, moving all four extremities  Skin: No lesions  Access: Left arm AVF    Basic Metabolic Panel:  Recent Labs Lab 06/30/16 1230 06/30/16 1500 07/05/16 1159 07/05/16 2015 07/06/16 0510  NA  --   --  134* 138 137  K 5.0 2.8* >7.5* 4.1 4.7  CL  --   --  95* 95* 96*  CO2  --   --  18* 28 31  GLUCOSE  --   --  219* 268* 80  BUN  --   --  121* 28* 38*  CREATININE  --   --  8.07* 3.35* 3.91*  CALCIUM  --   --  5.9* 7.0* 6.1*  MG  --   --   --  2.2  --     Liver Function Tests:  Recent Labs Lab 07/05/16 1159  AST 25  ALT 14  ALKPHOS 211*  BILITOT 1.0  PROT 6.3*  ALBUMIN 3.6   No results for input(s): LIPASE,  AMYLASE in the last 168 hours. No results for input(s): AMMONIA in the last 168 hours.  CBC:  Recent Labs Lab 07/05/16 1159 07/05/16 2015 07/06/16 0510  WBC 6.9 5.4 5.2  HGB 12.0 10.6* 10.0*  HCT 36.2 31.8* 29.9*  MCV 101.9* 100.6* 100.9*  PLT 183 159 160    Cardiac Enzymes: No results for input(s): CKTOTAL, CKMB, CKMBINDEX, TROPONINI in the last 168 hours.  BNP: Invalid input(s): POCBNP  CBG:  Recent Labs Lab 07/05/16 2128 07/06/16 0735  GLUCAP 240* 81    Microbiology: Results for orders placed or performed during the hospital encounter of 06/20/16  Blood culture (routine x 2)     Status: None   Collection Time: 06/20/16  4:41 PM  Result Value Ref Range Status   Specimen Description BLOOD rt forarm  Final   Special Requests NONE  Final   Culture NO GROWTH 5 DAYS  Final   Report Status 06/25/2016 FINAL  Final  Blood culture (routine x 2)     Status: None   Collection Time: 06/20/16  4:41 PM  Result Value Ref Range Status   Specimen Description BLOOD  port  Final  Special Requests NONE  Final   Culture NO GROWTH 5 DAYS  Final   Report Status 06/25/2016 FINAL  Final  MRSA PCR Screening     Status: None   Collection Time: 06/20/16  8:02 PM  Result Value Ref Range Status   MRSA by PCR NEGATIVE NEGATIVE Final    Comment:        The GeneXpert MRSA Assay (FDA approved for NASAL specimens only), is one component of a comprehensive MRSA colonization surveillance program. It is not intended to diagnose MRSA infection nor to guide or monitor treatment for MRSA infections.     Coagulation Studies: No results for input(s): LABPROT, INR in the last 72 hours.  Urinalysis: No results for input(s): COLORURINE, LABSPEC, PHURINE, GLUCOSEU, HGBUR, BILIRUBINUR, KETONESUR, PROTEINUR, UROBILINOGEN, NITRITE, LEUKOCYTESUR in the last 72 hours.  Invalid input(s): APPERANCEUR    Imaging: No results found.   Medications:       Assessment/ Plan:  Jamie Keller is a 51 y.o. Hispanic female Spanish speaking only with diabetes mellitus type II, hypertension, multiple myeloma, ESRD on hemodialysis.   [place]    1. End Stage Renal Disease: with hyperkalemia: emergent hemodialysis yesterday with 1K bath.  ESRD secondary to multiple myeloma Concern for recirculation, appreciate vascular input. Fistulogram for tomorrow.  - Continue MWF schedule. Treatment for tomorrow after fistulogram.   2.  Anemia of chronic kidney disease: hemoglobin 10 - epo with HD treatment.   3. Hypertension: elevated. With junctional rhythm on admission. Back in sinus.   - verpamil.   4. Secondary Hyperparathyroidism with hypocalcemia: outpatient PTH 968, outpatient calcium 7.1. Phosphorus at goal.  Jamie Keller.  5. Multiple myeloma on chemotherapy:  - Followed by Dr. Grayland Ormond.    LOS: 1 Jamie Keller 5/1/20189:39 AM

## 2016-07-06 NOTE — Consult Note (Signed)
[place]  [phone] [phone]) [phone]  ID: Jamie Keller OB: 1965/08/02  MR#: [id number]  CBJ#:[id number]  [name], MD as PCP - General (Family Medicine)  CHIEF COMPLAINT: Intractable nausea and vomiting, end-stage renal disease with hyperkalemia requiring emergent dialysis, multiple myeloma.  INTERVAL HISTORY: Patient is a 51 year old female who is actively receiving chemotherapy for underlying multiple myeloma who presented to the emergency room with nausea and vomiting. She is found to have a potassium level of 7.5 requiring emergent dialysis. Patient feels improved and nearly back to her baseline. She is at the side of the bed eating lunch. She has no neurologic complaints. She denies any fevers. She denies any chest pain or shortness of breath. She denies any further nausea or vomiting. She denies any pain. Patient feels nearly back to her baseline and offers no specific complaints today.  REVIEW OF SYSTEMS:   Review of Systems  Constitutional: Negative.  Negative for fever, malaise/fatigue and weight loss.  Respiratory: Negative.  Negative for cough and shortness of breath.   Cardiovascular: Negative.  Negative for chest pain and leg swelling.  Gastrointestinal: Negative.  Negative for abdominal pain, nausea and vomiting.  Genitourinary: Negative.   Musculoskeletal: Negative.   Neurological: Negative.  Negative for sensory change and weakness.  Psychiatric/Behavioral: Negative.  The patient is not nervous/anxious.     As per HPI. Otherwise, a complete review of systems is negative.  PAST MEDICAL HISTORY: Past Medical History:  Diagnosis Date  . Anemia   . Anxiety   . Chronic kidney disease    DIALYSIS  . Depression   . Diabetes mellitus without complication ([place])   . Dialysis patient ([place])    Mon, Wed, Fri  . History of chemotherapy    chemo on Tuesday and Wednesday  . Hypertension   .  Multiple myeloma ([place])   . Neuropathy associated with cancer ([place])   . Osteoarthritis   . Pancreatitis   . Pneumonia    September 2017, [facility]  . Post-menopausal 2014  . Sepsis ([place])     PAST SURGICAL HISTORY: Past Surgical History:  Procedure Laterality Date  . AV FISTULA PLACEMENT Left 02/20/2016   Procedure: INSERTION OF ARTERIOVENOUS (AV) GORE-TEX GRAFT ARM;  Surgeon: Katha Cabal, MD;  Location: [facility] ORS;  Service: Vascular;  Laterality: Left;  . CESAREAN SECTION     x2  . dialysis catheter placement    . PERIPHERAL VASCULAR CATHETERIZATION N/A 11/19/2015   Procedure: Dialysis/Perma Catheter Insertion;  Surgeon: Algernon Huxley, MD;  Location: [place] CV LAB;  Service: Cardiovascular;  Laterality: N/A;  . PERIPHERAL VASCULAR CATHETERIZATION Left 03/10/2016   Procedure: Thrombectomy;  Surgeon: Katha Cabal, MD;  Location: [place] CV LAB;  Service: Cardiovascular;  Laterality: Left;  . PERIPHERAL VASCULAR CATHETERIZATION N/A 03/31/2016   Procedure: Dialysis/Perma Catheter Removal;  Surgeon: Katha Cabal, MD;  Location: [place] CV LAB;  Service: Cardiovascular;  Laterality: N/A;  . PORTACATH PLACEMENT    . TUBAL LIGATION      FAMILY HISTORY: Family History  Problem Relation Age of Onset  . Hypercholesterolemia Mother   . Hypertension Mother   . Hypertension Father     ADVANCED DIRECTIVES (Y/N):  @ADVDIR @  HEALTH MAINTENANCE: Social History  Substance Use Topics  . Smoking status: Never Smoker  . Smokeless tobacco: Never Used  . Alcohol use No     Colonoscopy:  PAP:  Bone density:  Lipid panel:  No Known Allergies  Current Facility-Administered Medications  Medication Dose Route Frequency Provider Last Rate Last Dose  . 0.9 %  sodium chloride infusion  250 mL Intravenous PRN Demetrios Loll, MD      . acetaminophen (TYLENOL) tablet 650 mg  650 mg Oral Q6H PRN Demetrios Loll, MD       Or  . acetaminophen (TYLENOL) suppository 650 mg  650 mg Rectal  Q6H PRN Demetrios Loll, MD      . albuterol (PROVENTIL) (2.5 MG/3ML) 0.083% nebulizer solution 2.5 mg  2.5 mg Nebulization Q2H PRN Demetrios Loll, MD      . calcium gluconate inj 10% (1 g) URGENT USE ONLY!  1 g Intravenous Once Demetrios Loll, MD      . citalopram (CELEXA) tablet 20 mg  20 mg Oral Daily Demetrios Loll, MD   20 mg at 07/06/16 1206  . ferric citrate (AURYXIA) tablet 420 mg  420 mg Oral TID WC Lavonia Dana, MD   420 mg at 07/06/16 1246  . heparin injection 5,000 Units  5,000 Units Subcutaneous Q8H Demetrios Loll, MD   5,000 Units at 07/06/16 2446  . insulin aspart (novoLOG) injection 0-5 Units  0-5 Units Subcutaneous QHS Demetrios Loll, MD   2 Units at 07/05/16 2217  . insulin aspart (novoLOG) injection 0-9 Units  0-9 Units Subcutaneous TID WC Demetrios Loll, MD   2 Units at 07/06/16 1207  . insulin detemir (LEVEMIR) injection 5 Units  5 Units Subcutaneous Q2200 Demetrios Loll, MD   5 Units at 07/05/16 2217  . ondansetron (ZOFRAN) tablet 4 mg  4 mg Oral Q6H PRN Demetrios Loll, MD       Or  . ondansetron [facility]) injection 4 mg  4 mg Intravenous Q6H PRN Demetrios Loll, MD      . oxyCODONE (Oxy IR/ROXICODONE) immediate release tablet 10 mg  10 mg Oral Q4H PRN Demetrios Loll, MD      . prochlorperazine (COMPAZINE) tablet 10 mg  10 mg Oral Q6H PRN Demetrios Loll, MD      . senna-docusate (Senokot-S) tablet 1 tablet  1 tablet Oral QHS PRN Demetrios Loll, MD      . sodium chloride flush (NS) 0.9 % injection 3 mL  3 mL Intravenous Q12H Demetrios Loll, MD   3 mL at 07/05/16 2218  . sodium chloride flush (NS) 0.9 % injection 3 mL  3 mL Intravenous Q12H Demetrios Loll, MD   3 mL at 07/06/16 1208  . sodium chloride flush (NS) 0.9 % injection 3 mL  3 mL Intravenous PRN Demetrios Loll, MD      . verapamil (CALAN-SR) CR tablet 120 mg  120 mg Oral Daily Demetrios Loll, MD   120 mg at 07/06/16 1206   Facility-Administered Medications Ordered in Other Encounters  Medication Dose Route Frequency Provider Last Rate Last Dose  . LORazepam (ATIVAN) injection 1 mg  1 mg Intravenous  Once Lloyd Huger, MD        OBJECTIVE: Vitals:   07/06/16 0407 07/06/16 1208  BP: (!) 132/57 139/60  Pulse: 82   Resp: 18   Temp: 98.1 F (36.7 C)      Body mass index is 18.71 kg/m.    ECOG FS:1 - Symptomatic but completely ambulatory  General: Well-developed, well-nourished, no acute distress. Eyes: Pink conjunctiva, anicteric sclera. HEENT: Normocephalic, moist mucous membranes, clear oropharnyx. Lungs: Clear to auscultation bilaterally. Heart: Regular rate and rhythm. No rubs, murmurs, or gallops. Abdomen: Soft, nontender, nondistended. No organomegaly noted, normoactive bowel sounds. Musculoskeletal:  No edema, cyanosis, or clubbing. Neuro: Alert, answering all questions appropriately. Cranial nerves grossly intact. Skin: No rashes or petechiae noted. Psych: Normal affect. Lymphatics: No cervical, calvicular, axillary or inguinal LAD.   LAB RESULTS:  Lab Results  Component Value Date   NA 137 07/06/2016   K 4.7 07/06/2016   CL 96 (L) 07/06/2016   CO2 31 07/06/2016   GLUCOSE 80 07/06/2016   BUN 38 (H) 07/06/2016   CREATININE 3.91 (H) 07/06/2016   CALCIUM 6.1 (LL) 07/06/2016   PROT 6.3 (L) 07/05/2016   ALBUMIN 3.6 07/05/2016   AST 25 07/05/2016   ALT 14 07/05/2016   ALKPHOS 211 (H) 07/05/2016   BILITOT 1.0 07/05/2016   GFRNONAA 12 (L) 07/06/2016   GFRAA 14 (L) 07/06/2016    Lab Results  Component Value Date   WBC 5.2 07/06/2016   NEUTROABS 2.6 06/29/2016   HGB 10.0 (L) 07/06/2016   HCT 29.9 (L) 07/06/2016   MCV 100.9 (H) 07/06/2016   PLT 160 07/06/2016     STUDIES: No results found.  ASSESSMENT: Intractable nausea and vomiting, end-stage renal disease with hyperkalemia requiring emergent dialysis, multiple myeloma.  PLAN:    1. Nausea and vomiting: Resolved. It is unlikely her acute onset nausea and vomiting is related to her chemotherapy. 2. Multiple myeloma: Patient received cycle 6 of daratumumab on June 29, 2016. Her next treatment was  supposed to be today, but this will be delayed and she has been instructed to return to clinic at her previously scheduled follow-up appointment for further evaluation and consideration of cycle 7. 3. Hyperkalemia: Resolved. 4. Hypocalcemia: Improved. Case previously discussed with nephrology and they will attempt to increase her calcium bath with dialysis. 5. End-stage renal disease: Continue dialysis as per nephrology.  Appreciate consult, call with questions.  Entire visit was done in the presence of an interpreter.   Lloyd Huger, MD   07/06/2016 1:33 PM

## 2016-07-06 NOTE — Progress Notes (Signed)
Medical Interpreter was called to come with RN to go over medications, assessment, pt.'s medical plan for the day, and if she has any questions for the nurse or the doctor. Also, RN used the interpreter to receive consent for the procedure that is taking place tomorrow. Pt verbalizes understanding and does not have any questions at this time.   [name] [employer]

## 2016-07-06 NOTE — Progress Notes (Signed)
[place] at [place] NAME: Jamie Keller    MR#:  [id number]  DATE OF BIRTH:  October 30, 1965  SUBJECTIVE:  Via interpreter Patient was found to have elevated potassium when she came to get her AV graft workup done.  He underwent urgent hemodialysis. Potassium okay. Denies any complaints REVIEW OF SYSTEMS:   Review of Systems  Constitutional: Negative for chills, fever and weight loss.  HENT: Negative for ear discharge, ear pain and nosebleeds.   Eyes: Negative for blurred vision, pain and discharge.  Respiratory: Negative for sputum production, shortness of breath, wheezing and stridor.   Cardiovascular: Negative for chest pain, palpitations, orthopnea and PND.  Gastrointestinal: Negative for abdominal pain, diarrhea, nausea and vomiting.  Genitourinary: Negative for frequency and urgency.  Musculoskeletal: Negative for back pain and joint pain.  Neurological: Positive for weakness. Negative for sensory change, speech change and focal weakness.  Psychiatric/Behavioral: Negative for depression and hallucinations. The patient is not nervous/anxious.    Tolerating Diet:yes Tolerating PT: ambulatory  DRUG ALLERGIES:  No Known Allergies  VITALS:  Blood pressure 126/61, pulse 81, temperature 97.7 F (36.5 C), temperature source Oral, resp. rate 20, height 4' 11"  (1.499 m), weight 43.5 kg (95 lb 12.8 oz), last menstrual period 02/25/1995, SpO2 100 %.  PHYSICAL EXAMINATION:   Physical Exam  GENERAL:  51 y.o.-year-old patient lying in the bed with no acute distress.  EYES: Pupils equal, round, reactive to light and accommodation. No scleral icterus. Extraocular muscles intact.  HEENT: Head atraumatic, normocephalic. Oropharynx and nasopharynx clear.  NECK:  Supple, no jugular venous distention. No thyroid enlargement, no tenderness.  LUNGS: Normal breath sounds bilaterally, no wheezing, rales, rhonchi. No use of accessory muscles  of respiration.  CARDIOVASCULAR: S1, S2 normal. No murmurs, rubs, or gallops.  ABDOMEN: Soft, nontender, nondistended. Bowel sounds present. No organomegaly or mass.  EXTREMITIES: No cyanosis, clubbing or edema b/l.    NEUROLOGIC: Cranial nerves II through XII are intact. No focal Motor or sensory deficits b/l.   PSYCHIATRIC:  patient is alert and oriented x 3.  SKIN: No obvious rash, lesion, or ulcer.   LABORATORY PANEL:  CBC  Recent Labs Lab 07/06/16 0510  WBC 5.2  HGB 10.0*  HCT 29.9*  PLT 160    Chemistries   Recent Labs Lab 07/05/16 1159 07/05/16 2015 07/06/16 0510  NA 134* 138 137  K >7.5* 4.1 4.7  CL 95* 95* 96*  CO2 18* 28 31  GLUCOSE 219* 268* 80  BUN 121* 28* 38*  CREATININE 8.07* 3.35* 3.91*  CALCIUM 5.9* 7.0* 6.1*  MG  --  2.2  --   AST 25  --   --   ALT 14  --   --   ALKPHOS 211*  --   --   BILITOT 1.0  --   --    Cardiac Enzymes No results for input(s): TROPONINI in the last 168 hours. RADIOLOGY:  No results found. ASSESSMENT AND PLAN:   Jamie Keller  is a 51 y.o. female with a known history of hypertension, diabetes, ESRD on hemodialysis and multiple myeloma on chemotherapy. The patient presented to the ED with the above chief complaints.She is compliant to hemodialysis.  1. Acute Hyperkalemia.  -K>7.5 with EKG changes.pt got  Emergent HD  -K 7.5---4.2 -doing well   2. Malfunctioning AVG -dr dew to do fistulogram tomorrow  3. ESRD.  -HD now.  4.DM-2 -cont levemir and SSI  5. Hypertension. - Continue  verapamil.   Case discussed with Care Management/Social Worker. Management plans discussed with the patient, family and they are in agreement.  CODE STATUS: FULL  DVT Prophylaxis: Heparin  TOTAL TIME TAKING CARE OF THIS PATIENT: *40* minutes.  >50% time spent on counselling and coordination of care  POSSIBLE D/C IN 1-2 DAYS, DEPENDING ON CLINICAL CONDITION.  Note: This dictation was prepared with Dragon dictation  along with smaller phrase technology. Any transcriptional errors that result from this process are unintentional.  Malory Spurr M.D on 07/06/2016 at 2:38 PM  Between 7am to 6pm - Pager - [phone]  After 6pm go to [email or web address] - password [email or web address] [facility]  Office  [phone]  CC: Primary care physician; [name], MD

## 2016-07-07 ENCOUNTER — Inpatient Hospital Stay
Admission: EM | Disposition: A | Discharge status: Home / Self Care | Payer: Self-pay | Source: Home / Self Care | Attending: Internal Medicine

## 2016-07-07 DIAGNOSIS — N186 End stage renal disease: Secondary | ICD-10-CM

## 2016-07-07 DIAGNOSIS — T82868A Thrombosis of vascular prosthetic devices, implants and grafts, initial encounter: Secondary | ICD-10-CM

## 2016-07-07 HISTORY — PX: A/V SHUNT INTERVENTION: CATH118220

## 2016-07-07 LAB — GLUCOSE, CAPILLARY
GLUCOSE-CAPILLARY: 94 mg/dL (ref 65–99)
GLUCOSE-CAPILLARY: 103 mg/dL — AB (ref 65–99)
GLUCOSE-CAPILLARY: 90 mg/dL (ref 65–99)
GLUCOSE-CAPILLARY: 84 mg/dL (ref 65–99)
GLUCOSE-CAPILLARY: 175 mg/dL — AB (ref 65–99)

## 2016-07-07 LAB — HEMOGLOBIN A1C
HEMOGLOBIN A1C: 5.4 % (ref 4.8–5.6)
MEAN PLASMA GLUCOSE: 108 mg/dL

## 2016-07-07 SURGERY — A/V SHUNT INTERVENTION
Anesthesia: Moderate Sedation | Laterality: Left

## 2016-07-07 MED ORDER — MIDAZOLAM HCL 2 MG/2ML IJ SOLN
INTRAMUSCULAR | Status: DC | PRN
  Administered 2016-07-07: 2 mg via INTRAVENOUS

## 2016-07-07 MED ORDER — MIDAZOLAM HCL 5 MG/5ML IJ SOLN
INTRAMUSCULAR | Status: AC
  Filled 2016-07-07: qty 5

## 2016-07-07 MED ORDER — CEFAZOLIN SODIUM-DEXTROSE 1-4 GM/50ML-% IV SOLN
INTRAVENOUS | Status: AC
  Filled 2016-07-07: qty 50

## 2016-07-07 MED ORDER — IOPAMIDOL (ISOVUE-300) INJECTION 61%
INTRAVENOUS | Status: DC | PRN
  Administered 2016-07-07: 15 mL via INTRA_ARTERIAL

## 2016-07-07 MED ORDER — SODIUM CHLORIDE 0.9% FLUSH
10.0000 mL | Freq: Two times a day (BID) | INTRAVENOUS | Status: DC
  Administered 2016-07-07: 10 mL

## 2016-07-07 MED ORDER — FENTANYL CITRATE (PF) 100 MCG/2ML IJ SOLN
INTRAMUSCULAR | Status: DC | PRN
  Administered 2016-07-07: 50 ug via INTRAVENOUS

## 2016-07-07 MED ORDER — GLUCOSE 4 G PO CHEW
1.0000 | CHEWABLE_TABLET | Freq: Once | ORAL | Status: AC
  Administered 2016-07-07: 11:00:00 4 g via ORAL
  Filled 2016-07-07 (×2): qty 1

## 2016-07-07 MED ORDER — LIDOCAINE-EPINEPHRINE (PF) 2 %-1:200000 IJ SOLN
INTRAMUSCULAR | Status: AC
  Filled 2016-07-07: qty 20

## 2016-07-07 MED ORDER — SODIUM CHLORIDE 0.9 % IV SOLN
INTRAVENOUS | Status: DC
  Administered 2016-07-07: 15:00:00 via INTRAVENOUS

## 2016-07-07 MED ORDER — HEPARIN SODIUM (PORCINE) 1000 UNIT/ML IJ SOLN
INTRAMUSCULAR | Status: AC
  Filled 2016-07-07: qty 1

## 2016-07-07 MED ORDER — FENTANYL CITRATE (PF) 100 MCG/2ML IJ SOLN
INTRAMUSCULAR | Status: AC
  Filled 2016-07-07: qty 2

## 2016-07-07 MED ORDER — SODIUM CHLORIDE 0.9% FLUSH: 10.0000 mL | INTRAVENOUS | Status: DC | PRN

## 2016-07-07 MED ORDER — HEPARIN SOD (PORK) LOCK FLUSH 100 UNIT/ML IV SOLN
500.0000 [IU] | Freq: Once | INTRAVENOUS | Status: AC
  Administered 2016-07-07: 500 [IU] via INTRAVENOUS
  Filled 2016-07-07: qty 5

## 2016-07-07 SURGICAL SUPPLY — 7 items
CANNULA 5F STIFF (CANNULA) ×3 IMPLANT
DEVICE PRESTO INFLATION (MISCELLANEOUS) ×3 IMPLANT
NEEDLE ENTRY 21GA 7CM ECHOTIP (NEEDLE) ×3 IMPLANT
PACK ANGIOGRAPHY (CUSTOM PROCEDURE TRAY) ×3 IMPLANT
SHEATH BRITE TIP 6FRX5.5 (SHEATH) ×3 IMPLANT
SHIELD RADPAD DADD DRAPE 4X9 (MISCELLANEOUS) ×3 IMPLANT
SUT MNCRL AB 4-0 PS2 18 (SUTURE) ×3 IMPLANT

## 2016-07-07 NOTE — Discharge Instructions (Signed)
Resume your outpatient hemodialysis schedule as before

## 2016-07-07 NOTE — Progress Notes (Signed)
[facility]  ROUNDING NOTE   Subjective:   Fistulogram for later today. Patient is NPO and wants to eat.   Hemodialysis for today.   History taken with assistance of Spanish Interpreter.   Objective:  Vital signs in last 24 hours:  Temp:  [97.7 F (36.5 C)-98.2 F (36.8 C)] 98.2 F (36.8 C) (05/02 0606) Pulse Rate:  [79-86] 79 (05/02 0606) Resp:  [17-20] 17 (05/02 0606) BP: (113-149)/(57-64) 149/64 (05/02 0606) SpO2:  [98 %-100 %] 98 % (05/02 0606)  Weight change:  Filed Weights   07/05/16 1022 07/05/16 1350 07/06/16 0700  Weight: 46 kg (101 lb 6.4 oz) 45.9 kg (101 lb 3.1 oz) 43.5 kg (95 lb 12.8 oz)    Intake/Output: I/O last 3 completed shifts: In: 72 [P.O.:420] Out: 1050 [Urine:1050]   Intake/Output this shift:  Total I/O In: -  Out: 200 [Urine:200]  Physical Exam: General: Laying in bed  Head: Normocephalic, atraumatic. Moist oral mucosal membranes  Eyes: Anicteric, PERRL  Neck: Supple, trachea midline  Lungs:  Clear to auscultation  Heart: Regular rate and rhythm  Abdomen:  Soft, nontender,   Extremities: no peripheral edema.  Neurologic: Nonfocal, moving all four extremities  Skin: No lesions  Access: Left arm AVF    Basic Metabolic Panel:  Recent Labs Lab 06/30/16 1230 06/30/16 1500 07/05/16 1159 07/05/16 2015 07/06/16 0510  NA  --   --  134* 138 137  K 5.0 2.8* >7.5* 4.1 4.7  CL  --   --  95* 95* 96*  CO2  --   --  18* 28 31  GLUCOSE  --   --  219* 268* 80  BUN  --   --  121* 28* 38*  CREATININE  --   --  8.07* 3.35* 3.91*  CALCIUM  --   --  5.9* 7.0* 6.1*  MG  --   --   --  2.2  --     Liver Function Tests:  Recent Labs Lab 07/05/16 1159  AST 25  ALT 14  ALKPHOS 211*  BILITOT 1.0  PROT 6.3*  ALBUMIN 3.6   No results for input(s): LIPASE, AMYLASE in the last 168 hours. No results for input(s): AMMONIA in the last 168 hours.  CBC:  Recent Labs Lab 07/05/16 1159 07/05/16 2015 07/06/16 0510  WBC 6.9 5.4  5.2  HGB 12.0 10.6* 10.0*  HCT 36.2 31.8* 29.9*  MCV 101.9* 100.6* 100.9*  PLT 183 159 160    Cardiac Enzymes: No results for input(s): CKTOTAL, CKMB, CKMBINDEX, TROPONINI in the last 168 hours.  BNP: Invalid input(s): POCBNP  CBG:  Recent Labs Lab 07/06/16 1137 07/06/16 1641 07/06/16 2152 07/07/16 0722 07/07/16 1121  GLUCAP 160* 242* 124* 94 103*    Microbiology: Results for orders placed or performed during the hospital encounter of 06/20/16  Blood culture (routine x 2)     Status: None   Collection Time: 06/20/16  4:41 PM  Result Value Ref Range Status   Specimen Description BLOOD rt forarm  Final   Special Requests NONE  Final   Culture NO GROWTH 5 DAYS  Final   Report Status 06/25/2016 FINAL  Final  Blood culture (routine x 2)     Status: None   Collection Time: 06/20/16  4:41 PM  Result Value Ref Range Status   Specimen Description BLOOD  port  Final   Special Requests NONE  Final   Culture NO GROWTH 5 DAYS  Final   Report  Status 06/25/2016 FINAL  Final  MRSA PCR Screening     Status: None   Collection Time: 06/20/16  8:02 PM  Result Value Ref Range Status   MRSA by PCR NEGATIVE NEGATIVE Final    Comment:        The GeneXpert MRSA Assay (FDA approved for NASAL specimens only), is one component of a comprehensive MRSA colonization surveillance program. It is not intended to diagnose MRSA infection nor to guide or monitor treatment for MRSA infections.     Coagulation Studies: No results for input(s): LABPROT, INR in the last 72 hours.  Urinalysis: No results for input(s): COLORURINE, LABSPEC, PHURINE, GLUCOSEU, HGBUR, BILIRUBINUR, KETONESUR, PROTEINUR, UROBILINOGEN, NITRITE, LEUKOCYTESUR in the last 72 hours.  Invalid input(s): APPERANCEUR    Imaging: No results found.   Medications:       Assessment/ Plan:  [name] is a 51 y.o. Hispanic female Spanish speaking only with diabetes mellitus type II, hypertension,  multiple myeloma, ESRD on hemodialysis.   [place]    1. End Stage Renal Disease: with hyperkalemia: emergent hemodialysis on admission w ESRD secondary to multiple myeloma Concern for recirculation, appreciate vascular input. Fistulogram for today.  - Continue MWF schedule. Treatment for later today after fistulogram.   2.  Anemia of chronic kidney disease: hemoglobin 10 - epo with HD treatment.   3. Hypertension: elevated. With junctional rhythm on admission. Back in sinus.   - verapamil.   4. Secondary Hyperparathyroidism with hypocalcemia: outpatient PTH 968, outpatient calcium 7.1. Phosphorus at goal.  Lorin Picket.  5. Multiple myeloma on chemotherapy:  - Followed by Dr. Grayland Ormond.    LOS: 2 Jamie Keller 5/2/201812:15 PM

## 2016-07-07 NOTE — Progress Notes (Signed)
Patient discharged home with daughter.  Discharge instructions reviewed with patient and interpreter was present.  Chest port was deaccessed and needle was intact.  Dressing place over portacath and patient was safely transported downstairs to lobby where daughter is waiting.  Phoebe Sharps N  07/07/2016  11:00 PM

## 2016-07-07 NOTE — Discharge Summary (Signed)
[facility] NAME: Jamie Keller    MR#:  [id number]  DATE OF BIRTH:  1965-10-25  DATE OF ADMISSION:  07/05/2016 ADMITTING PHYSICIAN: Demetrios Loll, MD  DATE OF DISCHARGE: 07/07/2016  PRIMARY CARE PHYSICIAN: [name], MD    ADMISSION DIAGNOSIS:  Acute hyperkalemia [E87.5]  DISCHARGE DIAGNOSIS:  Acute hyperkalemia resolved Poorly functioning AV graft status post angiogram on 07/07/16 -ESRD on hemodialysis  SECONDARY DIAGNOSIS:   Past Medical History:  Diagnosis Date  . Anemia   . Anxiety   . Chronic kidney disease    DIALYSIS  . Depression   . Diabetes mellitus without complication ([place])   . Dialysis patient ([place])    Mon, Wed, Fri  . History of chemotherapy    chemo on Tuesday and Wednesday  . Hypertension   . Multiple myeloma ([place])   . Neuropathy associated with cancer ([place])   . Osteoarthritis   . Pancreatitis   . Pneumonia    September 2017, [facility]  . Post-menopausal 2014  . Sepsis [facility])     HOSPITAL COURSE:   [name] a 51 y.o.femalewith a known history of hypertension, diabetes, ESRD on hemodialysis and multiple myeloma on chemotherapy. The patient presented to the ED with the above chief complaints.She is compliant to hemodialysis.  1. Acute Hyperkalemia.  -K>7.5 with EKG changes.pt got  Emergent HD  -K 7.5---4.2 -doing well   2. Malfunctioning AVG -dr dew to do fistulogram today  3. ESRD.  -cont HD .  4.DM-2 -cont levemir and SSI  5. Hypertension. - Continue verapamil.  Patient her overall doing well. She'll be discharged to home after hemodialysis later tonight.  CONSULTS OBTAINED:  Treatment Team:  Sindy Guadeloupe, MD  DRUG ALLERGIES:  No Known Allergies  DISCHARGE MEDICATIONS:   Current Discharge Medication List    CONTINUE these medications which have NOT CHANGED   Details  citalopram (CELEXA) 20 MG tablet Take 1 tablet (20 mg total) by mouth  daily. Qty: 30 tablet, Refills: 5   Associated Diagnoses: Multiple myeloma in relapse (HCC)    Insulin Detemir (LEVEMIR FLEXPEN) 100 UNIT/ML Pen Inject 5 Units into the skin daily at 10 pm.    Oxycodone HCl 10 MG TABS Take 1 tablet (10 mg total) by mouth every 4 (four) hours as needed (take 1 tablet by mouth every 4 - 6 hours as needed for severe pain). Qty: 60 tablet, Refills: 0   Associated Diagnoses: Multiple myeloma in relapse (HCC)    prochlorperazine (COMPAZINE) 10 MG tablet Take 1 tablet (10 mg total) by mouth every 6 (six) hours as needed for nausea or vomiting. Qty: 30 tablet, Refills: 1    verapamil (CALAN-SR) 120 MG CR tablet Take 120 mg by mouth daily.    Insulin Pen Needle (NOVOFINE) 30G X 8 MM MISC Inject 10 each into the skin as needed. Qty: 5 packet, Refills: 0        If you experience worsening of your admission symptoms, develop shortness of breath, life threatening emergency, suicidal or homicidal thoughts you must seek medical attention immediately by calling 911 or calling your MD immediately  if symptoms less severe.  You Must read complete instructions/literature along with all the possible adverse reactions/side effects for all the Medicines you take and that have been prescribed to you. Take any new Medicines after you have completely understood and accept all the possible adverse reactions/side effects.   Please note  You were cared for by  a hospitalist during your hospital stay. If you have any questions about your discharge medications or the care you received while you were in the hospital after you are discharged, you can call the unit and asked to speak with the hospitalist on call if the hospitalist that took care of you is not available. Once you are discharged, your primary care physician will handle any further medical issues. Please note that NO REFILLS for any discharge medications will be authorized once you are discharged, as it is imperative that you  return to your primary care physician (or establish a relationship with a primary care physician if you do not have one) for your aftercare needs so that they can reassess your need for medications and monitor your lab values. Today   SUBJECTIVE   Via interpreter   feels hungry  VITAL SIGNS:  Blood pressure 137/68, pulse 78, temperature 98.1 F (36.7 C), temperature source Oral, resp. rate 17, height 4' 11"  (1.499 m), weight 43.5 kg (95 lb 12.8 oz), last menstrual period 02/25/1995, SpO2 99 %.  I/O:   Intake/Output Summary (Last 24 hours) at 07/07/16 1506 Last data filed at 07/07/16 1324  Gross per 24 hour  Intake                0 ml  Output              950 ml  Net             -950 ml    PHYSICAL EXAMINATION:  GENERAL:  51 y.o.-year-old patient lying in the bed with no acute distress.  EYES: Pupils equal, round, reactive to light and accommodation. No scleral icterus. Extraocular muscles intact.  HEENT: Head atraumatic, normocephalic. Oropharynx and nasopharynx clear.  NECK:  Supple, no jugular venous distention. No thyroid enlargement, no tenderness.  LUNGS: Normal breath sounds bilaterally, no wheezing, rales,rhonchi or crepitation. No use of accessory muscles of respiration.  CARDIOVASCULAR: S1, S2 normal. No murmurs, rubs, or gallops.  ABDOMEN: Soft, non-tender, non-distended. Bowel sounds present. No organomegaly or mass.  EXTREMITIES: No pedal edema, cyanosis, or clubbing.  NEUROLOGIC: Cranial nerves II through XII are intact. Muscle strength 5/5 in all extremities. Sensation intact. Gait not checked.  PSYCHIATRIC: The patient is alert and oriented x 3.  SKIN: No obvious rash, lesion, or ulcer.   DATA REVIEW:   CBC   Recent Labs Lab 07/06/16 0510  WBC 5.2  HGB 10.0*  HCT 29.9*  PLT 160    Chemistries   Recent Labs Lab 07/05/16 1159 07/05/16 2015 07/06/16 0510  NA 134* 138 137  K >7.5* 4.1 4.7  CL 95* 95* 96*  CO2 18* 28 31  GLUCOSE 219* 268* 80  BUN  121* 28* 38*  CREATININE 8.07* 3.35* 3.91*  CALCIUM 5.9* 7.0* 6.1*  MG  --  2.2  --   AST 25  --   --   ALT 14  --   --   ALKPHOS 211*  --   --   BILITOT 1.0  --   --     Microbiology Results   No results found for this or any previous visit (from the past 240 hour(s)).  RADIOLOGY:  No results found.   Management plans discussed with the patient, family and they are in agreement.  CODE STATUS:     Code Status Orders        Start     Ordered   07/05/16 1844  Full code  Continuous  07/05/16 1843    Code Status History    Date Active Date Inactive Code Status Order ID Comments User Context   06/20/2016  8:00 PM 06/21/2016  8:46 PM Full Code [id number]  Idelle Crouch, MD Inpatient   05/25/2016  5:45 PM 05/26/2016  9:52 PM Full Code [id number]  Flora Lipps, MD ED   04/01/2016  9:52 AM 04/02/2016  5:34 PM Full Code [id number]  Hillary Bow, MD ED   01/14/2016  7:59 PM 01/15/2016  8:39 PM Full Code [id number]  Loletha Grayer, MD ED   12/02/2015  1:10 AM 12/08/2015  7:22 PM Full Code [id number]  Lance Coon, MD ED   11/14/2015 12:52 AM 11/17/2015  1:12 PM Full Code [id number]  Holley Raring, NP ED   11/14/2015 12:06 AM 11/14/2015 12:12 AM Full Code [id number]  Holley Raring, NP ED      TOTAL TIME TAKING CARE OF THIS PATIENT: *40* minutes.    Shawnique Mariotti M.D on 07/07/2016 at 3:06 PM  Between 7am to 6pm - Pager - [phone] After 6pm go to [email or web address] - password [email or web address] [facility]  Office  [phone]  CC: Primary care physician; [name], MD

## 2016-07-07 NOTE — H&P (Signed)
[facility] History & Physical Update  The patient was interviewed and re-examined.  The patient's previous History and Physical has been reviewed and is unchanged.  There is no change in the plan of care. We plan to proceed with the scheduled procedure.  Leotis Pain, MD  07/07/2016, 3:19 PM

## 2016-07-07 NOTE — Op Note (Signed)
[facility]    OPERATIVE NOTE   PROCEDURE: 1.  Left brachial artery to axillary vein arteriovenous graft cannulation under ultrasound guidance 2.  Left arm shuntogram  PRE-OPERATIVE DIAGNOSIS: 1. ESRD 2. Hyperkalemia  POST-OPERATIVE DIAGNOSIS: same as above   SURGEON: Leotis Pain, MD  ANESTHESIA: local with MCS  ESTIMATED BLOOD LOSS: minimal  FINDING(S): 1. Normal flow in the AVG with a patent stent at the venous anastomosis and a widely patent arterial anastomosis.  There was minimal stenosis in the axillary vein of about 20% and a normal central venous circulation without stenosis.  SPECIMEN(S):  None  CONTRAST: 15 cc  FLUORO TIME: 0.1 minutes  MODERATE CONSCIOUS SEDATION TIME:  Approximately 15 minutes using 2 mg of Versed and 50 mcg of Fentanyl  INDICATIONS: Jamie Keller is a 51 y.o. female who presents with malfunctioning left arm arteriovenous graft admitted with hyperkalemia.  The patient is scheduled for left arm shuntogram.  The patient is aware the risks include but are not limited to: bleeding, infection, thrombosis of the cannulated access, and possible anaphylactic reaction to the contrast.  The patient is aware of the risks of the procedure and elects to proceed forward.  DESCRIPTION: After full informed written consent was obtained, the patient was brought back to the angiography suite and placed supine upon the angiography table.  The patient was connected to monitoring equipment. Moderate conscious sedation was administered during a face to face encounter throughout the procedure with my supervision of the RN administering medicines and monitoring the patient's vital signs, pulse oximetry, telemetry and mental status throughout from the start of the procedure until the patient was taken to the recovery room The left arm was prepped and draped in the standard fashion for a percutaneous access intervention.  Under ultrasound guidance,  the left brachial artery to axillary vein arteriovenous graft was cannulated with a micropuncture needle under direct ultrasound guidance and a permanent image was performed.  The microwire was advanced into the graft and the needle was exchanged for the a microsheath. Hand injections were completed to image the access including the central venous system. This demonstrated normal flow in the AVG with a patent stent at the venous anastomosis and a widely patent arterial anastomosis.  There was minimal stenosis in the axillary vein of about 20% and a normal central venous circulation without stenosis..  Based on the images, this patient will need no intervention.   A 4-0 Monocryl purse-string suture was sewn around the sheath.  The sheath was removed while tying down the suture.  A sterile bandage was applied to the puncture site.  COMPLICATIONS: None  CONDITION: Stable   Leotis Pain  07/07/2016 4:23 PM    This note was created with Dragon Medical transcription system. Any errors in dictation are purely unintentional.

## 2016-07-08 ENCOUNTER — Encounter: Payer: Self-pay | Admitting: Vascular Surgery

## 2016-07-08 NOTE — Progress Notes (Signed)
Nutrition:  Nutrition follow-up appointment cancelled for today due to hospital admission.   Will follow-up as able.  Belva Koziel B. Zenia Resides, [place], [place] Registered Dietitian [phone] (pager)

## 2016-07-11 NOTE — Progress Notes (Signed)
[facility]  [phone] [phone]  ID: Jamie Keller OB: 03/06/1966  MR#: [id number]  QPR#:[id number]  [name], MD as PCP - General (Family Medicine)  CHIEF COMPLAINT: Multiple Myeloma in relapse with multiple bony lytic lesions, now with end-stage renal disease on dialysis.   INTERVAL HISTORY: Patient returns to clinic today for further evaluation and consideration of cycle 8 of single agent daratumumab. She continues to feel chronically weak and fatigued. Her pain is better controlled. She denies any fever or chills. She has a poor appetite, but her weight has remained stable.  She continues to have chronic back pain that would occasionally radiate down her leg.  She has dyspnea on exertion, but denies cough, hemoptysis, or chest pain. She complains of mild nausea and occasional vomiting. Patient offers no further specific complaints today.   REVIEW OF SYSTEMS:   Review of Systems  Constitutional: Positive for malaise/fatigue. Negative for chills, fever and weight loss.  HENT: Negative for congestion.   Respiratory: Positive for shortness of breath. Negative for cough.   Cardiovascular: Negative.  Negative for chest pain and leg swelling.  Gastrointestinal: Negative for abdominal pain, blood in stool, constipation, diarrhea, nausea and vomiting.  Genitourinary: Negative for hematuria.       On dialysis  Musculoskeletal: Positive for back pain.  Skin: Negative for itching and rash.  Neurological: Positive for tingling, sensory change and weakness. Negative for focal weakness and headaches.  Psychiatric/Behavioral: Negative for hallucinations. The patient is nervous/anxious and has insomnia.    As per HPI. Otherwise, a complete review of systems is negative.   PAST MEDICAL HISTORY: Past Medical History:  Diagnosis Date  . Anemia   . Anxiety   . Chronic kidney disease    DIALYSIS  . Depression     . Diabetes mellitus without complication ([place])   . Dialysis patient ([place])    Mon, Wed, Fri  . History of chemotherapy    chemo on Tuesday and Wednesday  . Hypertension   . Multiple myeloma ([place])   . Neuropathy associated with cancer ([place])   . Osteoarthritis   . Pancreatitis   . Pneumonia    September 2017, [facility]  . Post-menopausal 2014  . Sepsis ([place])     PAST SURGICAL HISTORY: Past Surgical History:  Procedure Laterality Date  . AV FISTULA PLACEMENT Left 02/20/2016   Procedure: INSERTION OF ARTERIOVENOUS (AV) GORE-TEX GRAFT ARM;  Surgeon: Katha Cabal, MD;  Location: [facility] ORS;  Service: Vascular;  Laterality: Left;  . CESAREAN SECTION     x2  . dialysis catheter placement    . PERIPHERAL VASCULAR CATHETERIZATION N/A 11/19/2015   Procedure: Dialysis/Perma Catheter Insertion;  Surgeon: Algernon Huxley, MD;  Location: [place] CV LAB;  Service: Cardiovascular;  Laterality: N/A;  . PERIPHERAL VASCULAR CATHETERIZATION Left 03/10/2016   Procedure: Thrombectomy;  Surgeon: Katha Cabal, MD;  Location: [place] CV LAB;  Service: Cardiovascular;  Laterality: Left;  . PORTACATH PLACEMENT    . TUBAL LIGATION      FAMILY HISTORY: Reviewed and unchanged. No reported history of malignancy or chronic disease.     ADVANCED DIRECTIVES:    HEALTH MAINTENANCE: Social History  Substance Use Topics  . Smoking status: Never Smoker  . Smokeless tobacco: Never Used  . Alcohol use No     No Known Allergies  Current Outpatient Prescriptions  Medication Sig Dispense Refill  . acetaminophen (TYLENOL) 500 MG tablet Take 500 mg by mouth  every 6 (six) hours as needed.    . citalopram (CELEXA) 20 MG tablet Take 1 tablet (20 mg total) by mouth daily. 30 tablet 5  . HYDROcodone-acetaminophen (NORCO) 5-325 MG tablet Take 1-2 tablets by mouth every 6 (six) hours as needed for moderate pain or severe pain. 50 tablet 0  . Insulin Detemir (LEVEMIR FLEXPEN) 100 UNIT/ML Pen Inject 5 Units  into the skin daily at 10 pm.    . VERAPAMIL HCL ER PO Take 120 mg by mouth daily.    Marland Kitchen dronabinol (MARINOL) 2.5 MG capsule Take 1 capsule (2.5 mg total) by mouth 2 (two) times daily before a meal. (Patient not taking: Reported on 03/16/2016) 60 capsule 0  . ferrous sulfate 325 (65 FE) MG tablet Take 325 mg by mouth daily with breakfast.    . furosemide (LASIX) 80 MG tablet Take 1 tablet (80 mg total) by mouth daily. (Patient not taking: Reported on 03/16/2016) 30 tablet 0  . promethazine (PHENERGAN) 25 MG tablet Take 25 mg by mouth every 6 (six) hours as needed for nausea or vomiting.     No current facility-administered medications for this visit.    Facility-Administered Medications Ordered in Other Visits  Medication Dose Route Frequency Provider Last Rate Last Dose  . heparin lock flush 100 unit/mL  500 Units Intravenous Once Lloyd Huger, MD      . sodium chloride flush (NS) 0.9 % injection 10 mL  10 mL Intravenous Once Lloyd Huger, MD        Vitals:   07/13/16 1026  BP: 128/77  Pulse: 89  Temp: 97.5 F (36.4 C)   ECOG FS:2 - Symptomatic  General: No acute distress. Eyes: anicteric sclera. Lungs: Clear to auscultation bilaterally. Heart: Regular rate and rhythm. No rubs, murmurs, or gallops. Abdomen: Soft, nontender, nondistended. No organomegaly noted, normoactive bowel sounds. Musculoskeletal: No edema, cyanosis, or clubbing. Neuro: Alert, answering all questions appropriately. Cranial nerves grossly intact. Skin: No rashes or petechiae noted.  Psych: Normal affect.   LAB RESULTS:  CBC    Component Value Date/Time   WBC 6.0 07/13/2016 0835   RBC 3.53 (L) 07/13/2016 0835   HGB 11.8 (L) 07/13/2016 0835   HGB 11.7 (L) 06/27/2014 1344   HCT 34.5 (L) 07/13/2016 0835   HCT 34.0 (L) 06/27/2014 1344   PLT 193 07/13/2016 0835   PLT 267 06/27/2014 1344   MCV 97.9 07/13/2016 0835   MCV 88 06/27/2014 1344   MCH 33.6 07/13/2016 0835   MCHC 34.3 07/13/2016 0835     RDW 15.7 (H) 07/13/2016 0835   RDW 13.4 06/27/2014 1344   LYMPHSABS 0.7 (L) 07/13/2016 0835   LYMPHSABS 0.9 (L) 06/27/2014 1344   MONOABS 0.8 07/13/2016 0835   MONOABS 0.9 06/27/2014 1344   EOSABS 0.1 07/13/2016 0835   EOSABS 0.2 06/27/2014 1344   BASOSABS 0.1 07/13/2016 0835   BASOSABS 0.1 06/27/2014 1344   BMET    Component Value Date/Time   NA 134 (L) 07/13/2016 0835   NA 137 06/27/2014 1344   K 6.1 (H) 07/16/2016 1210   K 4.0 06/27/2014 1344   CL 94 (L) 07/13/2016 0835   CL 106 06/27/2014 1344   CO2 28 07/13/2016 0835   CO2 26 06/27/2014 1344   GLUCOSE 250 (H) 07/13/2016 0835   GLUCOSE 83 06/27/2014 1344   BUN 48 (H) 07/13/2016 0835   BUN 27 (H) 06/27/2014 1344   CREATININE 4.30 (H) 07/13/2016 0835   CREATININE 1.36 (H) 06/27/2014  1344   CALCIUM 8.2 (L) 07/13/2016 0835   CALCIUM 9.1 06/27/2014 1344   GFRNONAA 11 (L) 07/13/2016 0835   GFRNONAA 46 (L) 06/27/2014 1344   GFRAA 13 (L) 07/13/2016 0835   GFRAA 53 (L) 06/27/2014 1344   Lab Results  Component Value Date   TOTALPROTELP 6.1 07/13/2016   ALBUMINELP 3.8 07/13/2016   A1GS 0.3 07/13/2016   A2GS 0.8 07/13/2016   BETS 0.9 07/13/2016   GAMS 0.4 07/13/2016   MSPIKE 0.1 (H) 07/13/2016   SPEI Comment 07/13/2016        STUDIES: No results found.  ASSESSMENT: Multiple Myeloma in relapse with multiple bony lytic lesions, end-stage renal disease.   PLAN:    1. Multiple Myeloma in relapse with multiple bony lytic lesions: PET scan results reviewed independently consistent with progression of disease. Patient's kappa free light chains have trended down significantly, although have slightly trended up recently. Daratumumab does not need to be dose reduced in the setting of end-stage renal disease and dialysis. Hold Xgeva given her history of hypocalcemia. Because of patient's immigration status, she could not undergo transplant. Proceed with cycle 8 today.  After the patient receives 8 weekly cycles, will switch  to treatment every 2 weeks for an additional 8 cycles and then monthly until progression of disease. Return to clinic in 2 weeks for laboratory work and [place] only and then in 4 weeks for further evaluation and consideration of cycle 10.  2. Back pain: Continue current narcotics as prescribed.  3. End-stage renal disease: Likely secondary to progression of disease. Continue dialysis on Monday, Wednesdays and Fridays. Has fistula. 4. Anemia: Hemoglobin decreased. Patient does not require transfusion at this time. Continue Procrit at dialysis as needed. 5. Hyperglycemia: Patient's blood glucose has improved since initiating insulin. Continue monitoring and treatment by primary care. 6. Thrombocytopenia: Resolved. 7. Depression: Continue Celexa 20 mg daily.  8. Weight Loss: Stable without medication. Patient stopped taking Marinol due to making her "feel funny". She states that she has an improved appetite without the medication. Will continue to monitor. 9. Constipation: Resolved. Take OTC medications if needed. Monitor.  10. Pruritus: Resolved. Patient states Benadryl works, but only for several hours. Continue Atarax as needed. 11. Confusion/hallucinations: MRI brian is negative. 12. Hypocalcemia: Significantly improved. Case previously discussed with nephrology and they will attempt to increase her calcium with dialysis.   Interpreter present during entire clinic visit.  Patient expressed understanding and was in agreement with this plan. She also understands that She can call clinic at any time with any questions, concerns, or complaints.    Lloyd Huger, MD 07/18/16 9:04 AM

## 2016-07-13 ENCOUNTER — Inpatient Hospital Stay: Payer: Self-pay

## 2016-07-13 ENCOUNTER — Encounter: Payer: Self-pay | Admitting: Oncology

## 2016-07-13 ENCOUNTER — Inpatient Hospital Stay (HOSPITAL_BASED_OUTPATIENT_CLINIC_OR_DEPARTMENT_OTHER): Payer: Self-pay | Admitting: Oncology

## 2016-07-13 VITALS — BP 128/77 | HR 89 | Temp 97.5°F | Wt 98.8 lb

## 2016-07-13 VITALS — BP 133/69 | HR 81 | Temp 96.0°F | Resp 18

## 2016-07-13 DIAGNOSIS — R5383 Other fatigue: Secondary | ICD-10-CM

## 2016-07-13 DIAGNOSIS — Z8719 Personal history of other diseases of the digestive system: Secondary | ICD-10-CM

## 2016-07-13 DIAGNOSIS — I129 Hypertensive chronic kidney disease with stage 1 through stage 4 chronic kidney disease, or unspecified chronic kidney disease: Secondary | ICD-10-CM

## 2016-07-13 DIAGNOSIS — F329 Major depressive disorder, single episode, unspecified: Secondary | ICD-10-CM

## 2016-07-13 DIAGNOSIS — M1990 Unspecified osteoarthritis, unspecified site: Secondary | ICD-10-CM

## 2016-07-13 DIAGNOSIS — M549 Dorsalgia, unspecified: Secondary | ICD-10-CM

## 2016-07-13 DIAGNOSIS — D649 Anemia, unspecified: Secondary | ICD-10-CM

## 2016-07-13 DIAGNOSIS — R531 Weakness: Secondary | ICD-10-CM

## 2016-07-13 DIAGNOSIS — F419 Anxiety disorder, unspecified: Secondary | ICD-10-CM

## 2016-07-13 DIAGNOSIS — C9002 Multiple myeloma in relapse: Secondary | ICD-10-CM

## 2016-07-13 DIAGNOSIS — G629 Polyneuropathy, unspecified: Secondary | ICD-10-CM

## 2016-07-13 DIAGNOSIS — R630 Anorexia: Secondary | ICD-10-CM

## 2016-07-13 DIAGNOSIS — E1165 Type 2 diabetes mellitus with hyperglycemia: Secondary | ICD-10-CM

## 2016-07-13 DIAGNOSIS — G8929 Other chronic pain: Secondary | ICD-10-CM

## 2016-07-13 DIAGNOSIS — Z794 Long term (current) use of insulin: Secondary | ICD-10-CM

## 2016-07-13 DIAGNOSIS — Z9221 Personal history of antineoplastic chemotherapy: Secondary | ICD-10-CM

## 2016-07-13 DIAGNOSIS — Z992 Dependence on renal dialysis: Secondary | ICD-10-CM

## 2016-07-13 DIAGNOSIS — Z8701 Personal history of pneumonia (recurrent): Secondary | ICD-10-CM

## 2016-07-13 DIAGNOSIS — N186 End stage renal disease: Secondary | ICD-10-CM

## 2016-07-13 LAB — COMPREHENSIVE METABOLIC PANEL
ALK PHOS: 196 U/L — AB (ref 38–126)
ALT: 11 U/L — ABNORMAL LOW (ref 14–54)
ANION GAP: 12 (ref 5–15)
AST: 21 U/L (ref 15–41)
Albumin: 3.8 g/dL (ref 3.5–5.0)
BUN: 48 mg/dL — ABNORMAL HIGH (ref 6–20)
CHLORIDE: 94 mmol/L — AB (ref 101–111)
CO2: 28 mmol/L (ref 22–32)
Calcium: 8.2 mg/dL — ABNORMAL LOW (ref 8.9–10.3)
Creatinine, Ser: 4.3 mg/dL — ABNORMAL HIGH (ref 0.44–1.00)
GFR calc non Af Amer: 11 mL/min — ABNORMAL LOW (ref >60)
GFR, EST AFRICAN AMERICAN: 13 mL/min — AB (ref >60)
Glucose, Bld: 250 mg/dL — ABNORMAL HIGH (ref 65–99)
Potassium: 4.7 mmol/L (ref 3.5–5.1)
Sodium: 134 mmol/L — ABNORMAL LOW (ref 135–145)
Total Bilirubin: 0.6 mg/dL (ref 0.3–1.2)
Total Protein: 6.8 g/dL (ref 6.5–8.1)

## 2016-07-13 LAB — CBC WITH DIFFERENTIAL/PLATELET
Basophils Absolute: 0.1 10*3/uL (ref 0–0.1)
Basophils Relative: 1 %
EOS ABS: 0.1 10*3/uL (ref 0–0.7)
EOS PCT: 1 %
HCT: 34.5 % — ABNORMAL LOW (ref 35.0–47.0)
Hemoglobin: 11.8 g/dL — ABNORMAL LOW (ref 12.0–16.0)
LYMPHS ABS: 0.7 10*3/uL — AB (ref 1.0–3.6)
Lymphocytes Relative: 11 %
MCH: 33.6 pg (ref 26.0–34.0)
MCHC: 34.3 g/dL (ref 32.0–36.0)
MCV: 97.9 fL (ref 80.0–100.0)
MONO ABS: 0.8 10*3/uL (ref 0.2–0.9)
MONOS PCT: 14 %
Neutro Abs: 4.4 10*3/uL (ref 1.4–6.5)
Neutrophils Relative %: 73 %
PLATELETS: 193 10*3/uL (ref 150–440)
RBC: 3.53 MIL/uL — ABNORMAL LOW (ref 3.80–5.20)
RDW: 15.7 % — AB (ref 11.5–14.5)
WBC: 6 10*3/uL (ref 3.6–11.0)

## 2016-07-13 LAB — MAGNESIUM: Magnesium: 2.1 mg/dL (ref 1.7–2.4)

## 2016-07-13 MED ORDER — HEPARIN SOD (PORK) LOCK FLUSH 100 UNIT/ML IV SOLN
500.0000 [IU] | Freq: Once | INTRAVENOUS | Status: AC
  Administered 2016-07-13: 500 [IU] via INTRAVENOUS
  Filled 2016-07-13: qty 5

## 2016-07-13 MED ORDER — METHYLPREDNISOLONE SODIUM SUCC 125 MG IJ SOLR
125.0000 mg | Freq: Once | INTRAMUSCULAR | Status: AC
  Administered 2016-07-13: 125 mg via INTRAVENOUS
  Filled 2016-07-13: qty 2

## 2016-07-13 MED ORDER — PROCHLORPERAZINE MALEATE 10 MG PO TABS: 10.0000 mg | ORAL_TABLET | Freq: Four times a day (QID) | ORAL | 1 refills | Status: DC | PRN

## 2016-07-13 MED ORDER — ACETAMINOPHEN 325 MG PO TABS
650.0000 mg | ORAL_TABLET | Freq: Once | ORAL | Status: AC
  Administered 2016-07-13: 650 mg via ORAL
  Filled 2016-07-13: qty 2

## 2016-07-13 MED ORDER — SODIUM CHLORIDE 0.9 % IV SOLN
16.0000 mg/kg | Freq: Once | INTRAVENOUS | Status: AC
  Administered 2016-07-13: 720 mg via INTRAVENOUS
  Filled 2016-07-13: qty 36

## 2016-07-13 MED ORDER — SODIUM CHLORIDE 0.9% FLUSH
10.0000 mL | INTRAVENOUS | Status: DC | PRN
  Administered 2016-07-13: 10 mL
  Filled 2016-07-13: qty 10

## 2016-07-13 MED ORDER — SODIUM CHLORIDE 0.9 % IV SOLN
Freq: Once | INTRAVENOUS | Status: AC
  Administered 2016-07-13: 10:00:00 via INTRAVENOUS
  Filled 2016-07-13: qty 1000

## 2016-07-13 MED ORDER — PROCHLORPERAZINE MALEATE 10 MG PO TABS
10.0000 mg | ORAL_TABLET | Freq: Once | ORAL | Status: AC
  Administered 2016-07-13: 10 mg via ORAL
  Filled 2016-07-13: qty 1

## 2016-07-13 MED ORDER — SODIUM CHLORIDE 0.9% FLUSH
10.0000 mL | Freq: Once | INTRAVENOUS | Status: AC
  Administered 2016-07-13: 10 mL via INTRAVENOUS
  Filled 2016-07-13: qty 10

## 2016-07-13 MED ORDER — DIPHENHYDRAMINE HCL 25 MG PO CAPS
25.0000 mg | ORAL_CAPSULE | Freq: Once | ORAL | Status: AC
  Administered 2016-07-13: 25 mg via ORAL
  Filled 2016-07-13: qty 1

## 2016-07-14 LAB — KAPPA/LAMBDA LIGHT CHAINS
KAPPA FREE LGHT CHN: 112.8 mg/L — AB (ref 3.3–19.4)
KAPPA, LAMDA LIGHT CHAIN RATIO: 8.06 — AB (ref 0.26–1.65)
Kappa free light chain: 176.4 mg/L — ABNORMAL HIGH (ref 3.3–19.4)
Kappa, lambda light chain ratio: 15.89 — ABNORMAL HIGH (ref 0.26–1.65)
Lambda free light chains: 14 mg/L (ref 5.7–26.3)
Lambda free light chains: 11.1 mg/L (ref 5.7–26.3)

## 2016-07-14 LAB — PROTEIN ELECTROPHORESIS, SERUM
A/G Ratio: 1.5 (ref 0.7–1.7)
A/G Ratio: 1.7 (ref 0.7–1.7)
ALPHA-2-GLOBULIN: 0.9 g/dL (ref 0.4–1.0)
Albumin ELP: 3.7 g/dL (ref 2.9–4.4)
Albumin ELP: 3.8 g/dL (ref 2.9–4.4)
Alpha-1-Globulin: 0.3 g/dL (ref 0.0–0.4)
Alpha-1-Globulin: 0.3 g/dL (ref 0.0–0.4)
Alpha-2-Globulin: 0.8 g/dL (ref 0.4–1.0)
BETA GLOBULIN: 0.9 g/dL (ref 0.7–1.3)
Beta Globulin: 0.9 g/dL (ref 0.7–1.3)
GAMMA GLOBULIN: 0.4 g/dL (ref 0.4–1.8)
GLOBULIN, TOTAL: 2.5 g/dL (ref 2.2–3.9)
Gamma Globulin: 0.4 g/dL (ref 0.4–1.8)
Globulin, Total: 2.3 g/dL (ref 2.2–3.9)
M-SPIKE, %: 0.1 g/dL — AB
M-Spike, %: 0.1 g/dL — ABNORMAL HIGH
TOTAL PROTEIN ELP: 6.2 g/dL (ref 6.0–8.5)
Total Protein ELP: 6.1 g/dL (ref 6.0–8.5)

## 2016-07-14 LAB — IGG, IGA, IGM
IGM, SERUM: 13 mg/dL — AB (ref 26–217)
IgA: 16 mg/dL — ABNORMAL LOW (ref 87–352)
IgA: 15 mg/dL — ABNORMAL LOW (ref 87–352)
IgG (Immunoglobin G), Serum: 434 mg/dL — ABNORMAL LOW (ref 700–1600)
IgG (Immunoglobin G), Serum: 436 mg/dL — ABNORMAL LOW (ref 700–1600)
IgM, Serum: 11 mg/dL — ABNORMAL LOW (ref 26–217)

## 2016-07-16 ENCOUNTER — Other Ambulatory Visit
Admission: RE | Admit: 2016-07-16 | Discharge: 2016-07-16 | Disposition: A | Discharge status: Home / Self Care | Payer: Medicaid Other | Source: Ambulatory Visit | Attending: Nephrology | Admitting: Nephrology

## 2016-07-16 DIAGNOSIS — E875 Hyperkalemia: Secondary | ICD-10-CM | POA: Diagnosis present

## 2016-07-16 DIAGNOSIS — N186 End stage renal disease: Secondary | ICD-10-CM | POA: Diagnosis not present

## 2016-07-16 LAB — POTASSIUM: Potassium: 6.1 mmol/L — ABNORMAL HIGH (ref 3.5–5.1)

## 2016-07-27 ENCOUNTER — Other Ambulatory Visit: Payer: Self-pay | Admitting: Oncology

## 2016-07-27 ENCOUNTER — Inpatient Hospital Stay: Payer: Self-pay

## 2016-07-27 VITALS — BP 125/71 | HR 82 | Temp 96.0°F | Resp 18

## 2016-07-27 DIAGNOSIS — C9002 Multiple myeloma in relapse: Secondary | ICD-10-CM

## 2016-07-27 LAB — CBC WITH DIFFERENTIAL/PLATELET
BASOS PCT: 1 %
Basophils Absolute: 0.1 10*3/uL (ref 0–0.1)
Eosinophils Absolute: 0 10*3/uL (ref 0–0.7)
Eosinophils Relative: 1 %
HEMATOCRIT: 38.7 % (ref 35.0–47.0)
HEMOGLOBIN: 13.1 g/dL (ref 12.0–16.0)
LYMPHS ABS: 0.6 10*3/uL — AB (ref 1.0–3.6)
LYMPHS PCT: 14 %
MCH: 33.4 pg (ref 26.0–34.0)
MCHC: 33.9 g/dL (ref 32.0–36.0)
MCV: 98.7 fL (ref 80.0–100.0)
Monocytes Absolute: 0.6 10*3/uL (ref 0.2–0.9)
Monocytes Relative: 15 %
NEUTROS ABS: 2.9 10*3/uL (ref 1.4–6.5)
Neutrophils Relative %: 69 %
Platelets: 172 10*3/uL (ref 150–440)
RBC: 3.92 MIL/uL (ref 3.80–5.20)
RDW: 16.4 % — AB (ref 11.5–14.5)
WBC: 4.2 10*3/uL (ref 3.6–11.0)

## 2016-07-27 LAB — COMPREHENSIVE METABOLIC PANEL
ALBUMIN: 3.9 g/dL (ref 3.5–5.0)
ALK PHOS: 230 U/L — AB (ref 38–126)
ALT: 11 U/L — ABNORMAL LOW (ref 14–54)
AST: 21 U/L (ref 15–41)
Anion gap: 8 (ref 5–15)
BILIRUBIN TOTAL: 0.5 mg/dL (ref 0.3–1.2)
BUN: 46 mg/dL — ABNORMAL HIGH (ref 6–20)
CO2: 28 mmol/L (ref 22–32)
Calcium: 7.2 mg/dL — ABNORMAL LOW (ref 8.9–10.3)
Chloride: 100 mmol/L — ABNORMAL LOW (ref 101–111)
Creatinine, Ser: 5.14 mg/dL — ABNORMAL HIGH (ref 0.44–1.00)
GFR calc Af Amer: 10 mL/min — ABNORMAL LOW (ref >60)
GFR, EST NON AFRICAN AMERICAN: 9 mL/min — AB (ref >60)
GLUCOSE: 191 mg/dL — AB (ref 65–99)
POTASSIUM: 4.9 mmol/L (ref 3.5–5.1)
Sodium: 136 mmol/L (ref 135–145)
TOTAL PROTEIN: 6.8 g/dL (ref 6.5–8.1)

## 2016-07-27 LAB — MAGNESIUM: Magnesium: 2.1 mg/dL (ref 1.7–2.4)

## 2016-07-27 MED ORDER — SODIUM CHLORIDE 0.9 % IV SOLN
2.0000 g | Freq: Once | INTRAVENOUS | Status: AC
  Administered 2016-07-27: 2 g via INTRAVENOUS
  Filled 2016-07-27: qty 20

## 2016-07-27 MED ORDER — PROCHLORPERAZINE MALEATE 10 MG PO TABS
10.0000 mg | ORAL_TABLET | Freq: Once | ORAL | Status: AC
  Administered 2016-07-27: 10 mg via ORAL
  Filled 2016-07-27: qty 1

## 2016-07-27 MED ORDER — HEPARIN SOD (PORK) LOCK FLUSH 100 UNIT/ML IV SOLN
500.0000 [IU] | Freq: Once | INTRAVENOUS | Status: AC | PRN
  Administered 2016-07-27: 500 [IU]

## 2016-07-27 MED ORDER — SODIUM CHLORIDE 0.9 % IV SOLN
Freq: Once | INTRAVENOUS | Status: AC
  Administered 2016-07-27: 09:00:00 via INTRAVENOUS
  Filled 2016-07-27: qty 1000

## 2016-07-27 MED ORDER — HEPARIN SOD (PORK) LOCK FLUSH 100 UNIT/ML IV SOLN
500.0000 [IU] | Freq: Once | INTRAVENOUS | Status: DC
  Filled 2016-07-27: qty 5

## 2016-07-27 MED ORDER — SODIUM CHLORIDE 0.9% FLUSH
10.0000 mL | INTRAVENOUS | Status: DC | PRN
  Administered 2016-07-27: 10 mL via INTRAVENOUS
  Filled 2016-07-27: qty 10

## 2016-07-27 MED ORDER — SODIUM CHLORIDE 0.9% FLUSH
10.0000 mL | INTRAVENOUS | Status: DC | PRN
  Administered 2016-07-27: 10 mL
  Filled 2016-07-27: qty 10

## 2016-07-27 MED ORDER — DIPHENHYDRAMINE HCL 25 MG PO CAPS
25.0000 mg | ORAL_CAPSULE | Freq: Once | ORAL | Status: AC
  Administered 2016-07-27: 25 mg via ORAL
  Filled 2016-07-27: qty 1

## 2016-07-27 MED ORDER — METHYLPREDNISOLONE SODIUM SUCC 125 MG IJ SOLR
125.0000 mg | Freq: Once | INTRAMUSCULAR | Status: AC
  Administered 2016-07-27: 125 mg via INTRAVENOUS
  Filled 2016-07-27: qty 2

## 2016-07-27 MED ORDER — ACETAMINOPHEN 325 MG PO TABS
650.0000 mg | ORAL_TABLET | Freq: Once | ORAL | Status: AC
  Administered 2016-07-27: 650 mg via ORAL
  Filled 2016-07-27: qty 2

## 2016-07-27 MED ORDER — SODIUM CHLORIDE 0.9 % IV SOLN
16.0000 mg/kg | Freq: Once | INTRAVENOUS | Status: AC
  Administered 2016-07-27: 720 mg via INTRAVENOUS
  Filled 2016-07-27: qty 36

## 2016-07-28 LAB — PROTEIN ELECTROPHORESIS, SERUM
A/G Ratio: 1.8 — ABNORMAL HIGH (ref 0.7–1.7)
Albumin ELP: 3.8 g/dL (ref 2.9–4.4)
Alpha-1-Globulin: 0.2 g/dL (ref 0.0–0.4)
Alpha-2-Globulin: 0.7 g/dL (ref 0.4–1.0)
Beta Globulin: 0.8 g/dL (ref 0.7–1.3)
GAMMA GLOBULIN: 0.3 g/dL — AB (ref 0.4–1.8)
Globulin, Total: 2.1 g/dL — ABNORMAL LOW (ref 2.2–3.9)
TOTAL PROTEIN ELP: 5.9 g/dL — AB (ref 6.0–8.5)

## 2016-07-28 LAB — IGG, IGA, IGM
IGA: 15 mg/dL — AB (ref 87–352)
IGG (IMMUNOGLOBIN G), SERUM: 419 mg/dL — AB (ref 700–1600)
IGM, SERUM: 12 mg/dL — AB (ref 26–217)

## 2016-07-28 LAB — KAPPA/LAMBDA LIGHT CHAINS
KAPPA FREE LGHT CHN: 531.4 mg/L — AB (ref 3.3–19.4)
KAPPA, LAMDA LIGHT CHAIN RATIO: 45.42 — AB (ref 0.26–1.65)
LAMDA FREE LIGHT CHAINS: 11.7 mg/L (ref 5.7–26.3)

## 2016-08-06 ENCOUNTER — Other Ambulatory Visit: Payer: Self-pay | Admitting: *Deleted

## 2016-08-06 DIAGNOSIS — C9002 Multiple myeloma in relapse: Secondary | ICD-10-CM

## 2016-08-06 MED ORDER — OXYCODONE HCL 10 MG PO TABS: 10.0000 mg | ORAL_TABLET | ORAL | 0 refills | Status: DC | PRN

## 2016-08-09 NOTE — Progress Notes (Signed)
[place]  [phone] [phone]) [phone]  ID: [name] OB: [birth year]  MR#: [id number]  QTM#:[id number]  [name], MD as PCP - General (Family Medicine)  CHIEF COMPLAINT: Multiple Myeloma in relapse with multiple bony lytic lesions, now with end-stage renal disease on dialysis.   INTERVAL HISTORY: Patient returns to clinic today for further evaluation and consideration of cycle 10 of single agent daratumumab. She continues to feel chronically weak and fatigued. She continues to have occasional muscle cramping. Her pain is better controlled. She denies any fever or chills. She has a fair appetite, but her weight has remained stable.  She continues to have chronic back pain that occasionally radiates down her leg.  She has dyspnea on exertion, but denies cough, hemoptysis, or chest pain. She complains of mild nausea and occasional vomiting. Patient offers no further specific complaints today.   REVIEW OF SYSTEMS:   Review of Systems  Constitutional: Positive for malaise/fatigue. Negative for chills, fever and weight loss.  HENT: Negative for congestion.   Respiratory: Positive for shortness of breath. Negative for cough.   Cardiovascular: Negative.  Negative for chest pain and leg swelling.  Gastrointestinal: Negative for abdominal pain, blood in stool, constipation, diarrhea, nausea and vomiting.  Genitourinary: Negative for hematuria.       On dialysis  Musculoskeletal: Positive for back pain and myalgias.  Skin: Negative for itching and rash.  Neurological: Positive for tingling, sensory change and weakness. Negative for focal weakness and headaches.  Psychiatric/Behavioral: Negative for hallucinations. The patient is nervous/anxious and has insomnia.    As per HPI. Otherwise, a complete review of systems is negative.   PAST MEDICAL HISTORY: Past Medical History:  Diagnosis Date  . Anemia   . Anxiety    . Chronic kidney disease    DIALYSIS  . Depression   . Diabetes mellitus without complication ([place])   . Dialysis patient ([place])    Mon, Wed, Fri  . History of chemotherapy    chemo on Tuesday and Wednesday  . Hypertension   . Multiple myeloma ([place])   . Neuropathy associated with cancer ([place])   . Osteoarthritis   . Pancreatitis   . Pneumonia    September 2017, [facility]  . Post-menopausal 2014  . Sepsis ([place])     PAST SURGICAL HISTORY: Past Surgical History:  Procedure Laterality Date  . AV FISTULA PLACEMENT Left 02/20/2016   Procedure: INSERTION OF ARTERIOVENOUS (AV) GORE-TEX GRAFT ARM;  Surgeon: Katha Cabal, MD;  Location: [facility] ORS;  Service: Vascular;  Laterality: Left;  . CESAREAN SECTION     x2  . dialysis catheter placement    . PERIPHERAL VASCULAR CATHETERIZATION N/A 11/19/2015   Procedure: Dialysis/Perma Catheter Insertion;  Surgeon: Algernon Huxley, MD;  Location: [place] [facility];  Service: Cardiovascular;  Laterality: N/A;  . PERIPHERAL VASCULAR CATHETERIZATION Left 03/10/2016   Procedure: Thrombectomy;  Surgeon: Katha Cabal, MD;  Location: [place] [facility];  Service: Cardiovascular;  Laterality: Left;  . PORTACATH PLACEMENT    . TUBAL LIGATION      FAMILY HISTORY: Reviewed and unchanged. No reported history of malignancy or chronic disease.     ADVANCED DIRECTIVES:    HEALTH MAINTENANCE: Social History  Substance Use Topics  . Smoking status: Never Smoker  . Smokeless tobacco: Never Used  . Alcohol use No     No Known Allergies  Current Outpatient Prescriptions  Medication Sig Dispense Refill  . acetaminophen (TYLENOL) 500  MG tablet Take 500 mg by mouth every 6 (six) hours as needed.    . citalopram (CELEXA) 20 MG tablet Take 1 tablet (20 mg total) by mouth daily. 30 tablet 5  . HYDROcodone-acetaminophen (NORCO) 5-325 MG tablet Take 1-2 tablets by mouth every 6 (six) hours as needed for moderate pain or severe pain. 50 tablet 0  . Insulin  Detemir (LEVEMIR FLEXPEN) 100 UNIT/ML Pen Inject 5 Units into the skin daily at 10 pm.    . VERAPAMIL HCL ER PO Take 120 mg by mouth daily.    Marland Kitchen dronabinol (MARINOL) 2.5 MG capsule Take 1 capsule (2.5 mg total) by mouth 2 (two) times daily before a meal. (Patient not taking: Reported on 03/16/2016) 60 capsule 0  . ferrous sulfate 325 (65 FE) MG tablet Take 325 mg by mouth daily with breakfast.    . furosemide (LASIX) 80 MG tablet Take 1 tablet (80 mg total) by mouth daily. (Patient not taking: Reported on 03/16/2016) 30 tablet 0  . promethazine (PHENERGAN) 25 MG tablet Take 25 mg by mouth every 6 (six) hours as needed for nausea or vomiting.     No current facility-administered medications for this visit.    Facility-Administered Medications Ordered in Other Visits  Medication Dose Route Frequency Provider Last Rate Last Dose  . heparin lock flush 100 unit/mL  500 Units Intravenous Once Lloyd Huger, MD      . sodium chloride flush (NS) 0.9 % injection 10 mL  10 mL Intravenous Once Lloyd Huger, MD        Vitals:   08/10/16 0929  BP: (!) 149/76  Pulse: 79  Resp: 18  Temp: 97.4 F (36.3 C)   ECOG FS:1 - Symptomatic  General: No acute distress. Eyes: anicteric sclera. Lungs: Clear to auscultation bilaterally. Heart: Regular rate and rhythm. No rubs, murmurs, or gallops. Abdomen: Soft, nontender, nondistended. No organomegaly noted, normoactive bowel sounds. Musculoskeletal: No edema, cyanosis, or clubbing. Neuro: Alert, answering all questions appropriately. Cranial nerves grossly intact. Skin: No rashes or petechiae noted.  Psych: Normal affect.   LAB RESULTS:  CBC    Component Value Date/Time   WBC 5.5 08/10/2016 0902   RBC 3.97 08/10/2016 0902   HGB 13.4 08/10/2016 0902   HGB 11.7 (L) 06/27/2014 1344   HCT 39.0 08/10/2016 0902   HCT 34.0 (L) 06/27/2014 1344   PLT 154 08/10/2016 0902   PLT 267 06/27/2014 1344   MCV 98.2 08/10/2016 0902   MCV 88 06/27/2014  1344   MCH 33.6 08/10/2016 0902   MCHC 34.3 08/10/2016 0902   RDW 16.1 (H) 08/10/2016 0902   RDW 13.4 06/27/2014 1344   LYMPHSABS 0.5 (L) 08/10/2016 0902   LYMPHSABS 0.9 (L) 06/27/2014 1344   MONOABS 0.7 08/10/2016 0902   MONOABS 0.9 06/27/2014 1344   EOSABS 0.1 08/10/2016 0902   EOSABS 0.2 06/27/2014 1344   BASOSABS 0.0 08/10/2016 0902   BASOSABS 0.1 06/27/2014 1344   BMET    Component Value Date/Time   NA 135 08/10/2016 0902   NA 137 06/27/2014 1344   K 5.2 (H) 08/10/2016 0902   K 4.0 06/27/2014 1344   CL 90 (L) 08/10/2016 0902   CL 106 06/27/2014 1344   CO2 30 08/10/2016 0902   CO2 26 06/27/2014 1344   GLUCOSE 276 (H) 08/10/2016 0902   GLUCOSE 83 06/27/2014 1344   BUN 55 (H) 08/10/2016 0902   BUN 27 (H) 06/27/2014 1344   CREATININE 7.25 (H) 08/10/2016 0902  CREATININE 1.36 (H) 06/27/2014 1344   CALCIUM 6.3 (LL) 08/10/2016 0902   CALCIUM 9.1 06/27/2014 1344   GFRNONAA 6 (L) 08/10/2016 0902   GFRNONAA 46 (L) 06/27/2014 1344   GFRAA 7 (L) 08/10/2016 0902   GFRAA 53 (L) 06/27/2014 1344   Lab Results  Component Value Date   TOTALPROTELP 5.9 (L) 07/27/2016   ALBUMINELP 3.8 07/27/2016   A1GS 0.2 07/27/2016   A2GS 0.7 07/27/2016   BETS 0.8 07/27/2016   GAMS 0.3 (L) 07/27/2016   MSPIKE Not Observed 07/27/2016   SPEI Comment 07/27/2016        STUDIES: No results found.  ASSESSMENT: Multiple Myeloma in relapse with multiple bony lytic lesions, end-stage renal disease.   PLAN:    1. Multiple Myeloma in relapse with multiple bony lytic lesions: PET scan results reviewed independently consistent with progression of disease. Patient's kappa free light chains have trended down significantly, although have slightly trended up recently. Daratumumab does not need to be dose reduced in the setting of end-stage renal disease and dialysis. Hold Xgeva given her history of hypocalcemia. Because of patient's immigration status, she could not undergo transplant. Proceed with  cycle 10 today.  Continue treatment every 2 weeks through cycle 16 then we will switch to monthly maintenance until progression of disease. Return to clinic in 2 weeks for laboratory work and [place] only and then in 4 weeks for further evaluation and consideration of cycle 12.  2. Back pain: Continue current narcotics as prescribed.  3. End-stage renal disease: Likely secondary to progression of disease. Continue dialysis on Monday, Wednesdays and Fridays. Has fistula. 4. Anemia: Hemoglobin decreased. Patient does not require transfusion at this time. Continue Procrit at dialysis as needed. 5. Hyperglycemia: Patient's blood glucose has improved since initiating insulin. Continue monitoring and treatment by primary care. 6. Thrombocytopenia: Resolved. 7. Depression: Continue Celexa 20 mg daily.  8. Weight Loss: Stable without medication. Patient stopped taking Marinol due to making her "feel funny". She states that she has an improved appetite without the medication. Will continue to monitor. 9. Constipation: Resolved. Take OTC medications if needed. Monitor.  10. Pruritus: Resolved. Patient states Benadryl works, but only for several hours. Continue Atarax as needed. 11. Confusion/hallucinations: MRI brian is negative. 12. Hypocalcemia: Patient received 2 g of IV calcium gluconate today. Case previously discussed with nephrology and they will attempt to increase her calcium with dialysis.   Interpreter present during entire clinic visit.  Patient expressed understanding and was in agreement with this plan. She also understands that She can call clinic at any time with any questions, concerns, or complaints.    Lloyd Huger, MD 08/15/16 9:55 AM

## 2016-08-10 ENCOUNTER — Inpatient Hospital Stay: Payer: Self-pay

## 2016-08-10 ENCOUNTER — Inpatient Hospital Stay: Payer: Self-pay | Attending: Oncology | Admitting: Oncology

## 2016-08-10 VITALS — BP 149/76 | HR 79 | Temp 97.4°F | Resp 18 | Wt 101.6 lb

## 2016-08-10 DIAGNOSIS — K859 Acute pancreatitis without necrosis or infection, unspecified: Secondary | ICD-10-CM | POA: Insufficient documentation

## 2016-08-10 DIAGNOSIS — E1165 Type 2 diabetes mellitus with hyperglycemia: Secondary | ICD-10-CM | POA: Insufficient documentation

## 2016-08-10 DIAGNOSIS — C9002 Multiple myeloma in relapse: Secondary | ICD-10-CM

## 2016-08-10 DIAGNOSIS — R634 Abnormal weight loss: Secondary | ICD-10-CM | POA: Insufficient documentation

## 2016-08-10 DIAGNOSIS — M549 Dorsalgia, unspecified: Secondary | ICD-10-CM | POA: Insufficient documentation

## 2016-08-10 DIAGNOSIS — N186 End stage renal disease: Secondary | ICD-10-CM | POA: Insufficient documentation

## 2016-08-10 DIAGNOSIS — F329 Major depressive disorder, single episode, unspecified: Secondary | ICD-10-CM | POA: Insufficient documentation

## 2016-08-10 DIAGNOSIS — F419 Anxiety disorder, unspecified: Secondary | ICD-10-CM | POA: Insufficient documentation

## 2016-08-10 DIAGNOSIS — Z79899 Other long term (current) drug therapy: Secondary | ICD-10-CM | POA: Insufficient documentation

## 2016-08-10 DIAGNOSIS — I129 Hypertensive chronic kidney disease with stage 1 through stage 4 chronic kidney disease, or unspecified chronic kidney disease: Secondary | ICD-10-CM | POA: Insufficient documentation

## 2016-08-10 DIAGNOSIS — R5383 Other fatigue: Secondary | ICD-10-CM | POA: Insufficient documentation

## 2016-08-10 DIAGNOSIS — M1990 Unspecified osteoarthritis, unspecified site: Secondary | ICD-10-CM | POA: Insufficient documentation

## 2016-08-10 DIAGNOSIS — D649 Anemia, unspecified: Secondary | ICD-10-CM | POA: Insufficient documentation

## 2016-08-10 DIAGNOSIS — Z794 Long term (current) use of insulin: Secondary | ICD-10-CM | POA: Insufficient documentation

## 2016-08-10 DIAGNOSIS — R531 Weakness: Secondary | ICD-10-CM | POA: Insufficient documentation

## 2016-08-10 DIAGNOSIS — Z992 Dependence on renal dialysis: Secondary | ICD-10-CM | POA: Insufficient documentation

## 2016-08-10 DIAGNOSIS — G8929 Other chronic pain: Secondary | ICD-10-CM | POA: Insufficient documentation

## 2016-08-10 DIAGNOSIS — Z5112 Encounter for antineoplastic immunotherapy: Secondary | ICD-10-CM | POA: Insufficient documentation

## 2016-08-10 LAB — CBC WITH DIFFERENTIAL/PLATELET
BASOS ABS: 0 10*3/uL (ref 0–0.1)
BASOS PCT: 1 %
EOS PCT: 1 %
Eosinophils Absolute: 0.1 10*3/uL (ref 0–0.7)
HCT: 39 % (ref 35.0–47.0)
Hemoglobin: 13.4 g/dL (ref 12.0–16.0)
Lymphocytes Relative: 9 %
Lymphs Abs: 0.5 10*3/uL — ABNORMAL LOW (ref 1.0–3.6)
MCH: 33.6 pg (ref 26.0–34.0)
MCHC: 34.3 g/dL (ref 32.0–36.0)
MCV: 98.2 fL (ref 80.0–100.0)
MONO ABS: 0.7 10*3/uL (ref 0.2–0.9)
Monocytes Relative: 12 %
Neutro Abs: 4.2 10*3/uL (ref 1.4–6.5)
Neutrophils Relative %: 77 %
PLATELETS: 154 10*3/uL (ref 150–440)
RBC: 3.97 MIL/uL (ref 3.80–5.20)
RDW: 16.1 % — AB (ref 11.5–14.5)
WBC: 5.5 10*3/uL (ref 3.6–11.0)

## 2016-08-10 LAB — COMPREHENSIVE METABOLIC PANEL
ALBUMIN: 3.9 g/dL (ref 3.5–5.0)
ALT: 11 U/L — ABNORMAL LOW (ref 14–54)
ANION GAP: 15 (ref 5–15)
AST: 26 U/L (ref 15–41)
Alkaline Phosphatase: 196 U/L — ABNORMAL HIGH (ref 38–126)
BILIRUBIN TOTAL: 0.7 mg/dL (ref 0.3–1.2)
BUN: 55 mg/dL — AB (ref 6–20)
CO2: 30 mmol/L (ref 22–32)
Calcium: 6.3 mg/dL — CL (ref 8.9–10.3)
Chloride: 90 mmol/L — ABNORMAL LOW (ref 101–111)
Creatinine, Ser: 7.25 mg/dL — ABNORMAL HIGH (ref 0.44–1.00)
GFR calc Af Amer: 7 mL/min — ABNORMAL LOW (ref >60)
GFR calc non Af Amer: 6 mL/min — ABNORMAL LOW (ref >60)
GLUCOSE: 276 mg/dL — AB (ref 65–99)
POTASSIUM: 5.2 mmol/L — AB (ref 3.5–5.1)
Sodium: 135 mmol/L (ref 135–145)
TOTAL PROTEIN: 6.6 g/dL (ref 6.5–8.1)

## 2016-08-10 LAB — MAGNESIUM: Magnesium: 2 mg/dL (ref 1.7–2.4)

## 2016-08-10 MED ORDER — METHYLPREDNISOLONE SODIUM SUCC 125 MG IJ SOLR
125.0000 mg | Freq: Once | INTRAMUSCULAR | Status: AC
Start: 2016-08-10 — End: 2016-08-10
  Administered 2016-08-10: 125 mg via INTRAVENOUS
  Filled 2016-08-10: qty 2

## 2016-08-10 MED ORDER — ACETAMINOPHEN 325 MG PO TABS
650.0000 mg | ORAL_TABLET | Freq: Once | ORAL | Status: AC
  Administered 2016-08-10: 650 mg via ORAL
  Filled 2016-08-10: qty 2

## 2016-08-10 MED ORDER — SODIUM CHLORIDE 0.9 % IV SOLN
2.0000 g | Freq: Once | INTRAVENOUS | Status: AC
  Administered 2016-08-10: 2 g via INTRAVENOUS
  Filled 2016-08-10: qty 20

## 2016-08-10 MED ORDER — PROCHLORPERAZINE MALEATE 10 MG PO TABS
10.0000 mg | ORAL_TABLET | Freq: Once | ORAL | Status: AC
  Administered 2016-08-10: 10 mg via ORAL
  Filled 2016-08-10: qty 1

## 2016-08-10 MED ORDER — SODIUM CHLORIDE 0.9% FLUSH
10.0000 mL | INTRAVENOUS | Status: DC | PRN
Start: 2016-08-10 — End: 2016-08-10
  Administered 2016-08-10: 10 mL via INTRAVENOUS
  Filled 2016-08-10: qty 10

## 2016-08-10 MED ORDER — HEPARIN SOD (PORK) LOCK FLUSH 100 UNIT/ML IV SOLN: 500.0000 [IU] | Freq: Once | INTRAVENOUS | Status: DC | PRN

## 2016-08-10 MED ORDER — DARATUMUMAB CHEMO INJECTION 400 MG/20ML
16.0000 mg/kg | Freq: Once | INTRAVENOUS | Status: AC
  Administered 2016-08-10: 720 mg via INTRAVENOUS
  Filled 2016-08-10: qty 36

## 2016-08-10 MED ORDER — HEPARIN SOD (PORK) LOCK FLUSH 100 UNIT/ML IV SOLN
500.0000 [IU] | Freq: Once | INTRAVENOUS | Status: AC
  Administered 2016-08-10: 500 [IU] via INTRAVENOUS
  Filled 2016-08-10: qty 5

## 2016-08-10 MED ORDER — SODIUM CHLORIDE 0.9 % IV SOLN
Freq: Once | INTRAVENOUS | Status: AC
  Administered 2016-08-10: 10:00:00 via INTRAVENOUS
  Filled 2016-08-10: qty 1000

## 2016-08-10 MED ORDER — DIPHENHYDRAMINE HCL 25 MG PO CAPS
25.0000 mg | ORAL_CAPSULE | Freq: Once | ORAL | Status: AC
  Administered 2016-08-10: 25 mg via ORAL
  Filled 2016-08-10: qty 1

## 2016-08-24 ENCOUNTER — Inpatient Hospital Stay: Payer: Self-pay

## 2016-08-24 VITALS — BP 129/74 | HR 85 | Temp 96.8°F | Resp 18

## 2016-08-24 DIAGNOSIS — C9002 Multiple myeloma in relapse: Secondary | ICD-10-CM

## 2016-08-24 LAB — COMPREHENSIVE METABOLIC PANEL
ALBUMIN: 3.9 g/dL (ref 3.5–5.0)
ALK PHOS: 207 U/L — AB (ref 38–126)
ALT: 13 U/L — ABNORMAL LOW (ref 14–54)
ANION GAP: 15 (ref 5–15)
AST: 25 U/L (ref 15–41)
BUN: 59 mg/dL — ABNORMAL HIGH (ref 6–20)
CHLORIDE: 93 mmol/L — AB (ref 101–111)
CO2: 29 mmol/L (ref 22–32)
CREATININE: 5.56 mg/dL — AB (ref 0.44–1.00)
Calcium: 7.1 mg/dL — ABNORMAL LOW (ref 8.9–10.3)
GFR calc non Af Amer: 8 mL/min — ABNORMAL LOW (ref >60)
GFR, EST AFRICAN AMERICAN: 9 mL/min — AB (ref >60)
GLUCOSE: 190 mg/dL — AB (ref 65–99)
Potassium: 4.3 mmol/L (ref 3.5–5.1)
SODIUM: 137 mmol/L (ref 135–145)
Total Bilirubin: 0.6 mg/dL (ref 0.3–1.2)
Total Protein: 7 g/dL (ref 6.5–8.1)

## 2016-08-24 LAB — CBC WITH DIFFERENTIAL/PLATELET
BASOS ABS: 0.1 10*3/uL (ref 0–0.1)
BASOS PCT: 1 %
EOS PCT: 1 %
Eosinophils Absolute: 0.1 10*3/uL (ref 0–0.7)
HCT: 36.8 % (ref 35.0–47.0)
HEMOGLOBIN: 12.8 g/dL (ref 12.0–16.0)
LYMPHS ABS: 0.8 10*3/uL — AB (ref 1.0–3.6)
Lymphocytes Relative: 14 %
MCH: 32.9 pg (ref 26.0–34.0)
MCHC: 34.9 g/dL (ref 32.0–36.0)
MCV: 94.4 fL (ref 80.0–100.0)
Monocytes Absolute: 0.7 10*3/uL (ref 0.2–0.9)
Monocytes Relative: 12 %
Neutro Abs: 4.1 10*3/uL (ref 1.4–6.5)
Neutrophils Relative %: 72 %
PLATELETS: 172 10*3/uL (ref 150–440)
RBC: 3.89 MIL/uL (ref 3.80–5.20)
RDW: 14.5 % (ref 11.5–14.5)
WBC: 5.7 10*3/uL (ref 3.6–11.0)

## 2016-08-24 LAB — MAGNESIUM: Magnesium: 1.9 mg/dL (ref 1.7–2.4)

## 2016-08-24 MED ORDER — SODIUM CHLORIDE 0.9% FLUSH
10.0000 mL | Freq: Once | INTRAVENOUS | Status: AC
  Administered 2016-08-24: 10 mL via INTRAVENOUS
  Filled 2016-08-24: qty 10

## 2016-08-24 MED ORDER — SODIUM CHLORIDE 0.9 % IV SOLN
Freq: Once | INTRAVENOUS | Status: AC
  Administered 2016-08-24: 10:00:00 via INTRAVENOUS
  Filled 2016-08-24: qty 1000

## 2016-08-24 MED ORDER — HEPARIN SOD (PORK) LOCK FLUSH 100 UNIT/ML IV SOLN
500.0000 [IU] | Freq: Once | INTRAVENOUS | Status: AC
  Administered 2016-08-24: 500 [IU] via INTRAVENOUS
  Filled 2016-08-24: qty 5

## 2016-08-24 MED ORDER — METHYLPREDNISOLONE SODIUM SUCC 125 MG IJ SOLR
125.0000 mg | Freq: Once | INTRAMUSCULAR | Status: AC
  Administered 2016-08-24: 125 mg via INTRAVENOUS
  Filled 2016-08-24: qty 2

## 2016-08-24 MED ORDER — PROCHLORPERAZINE MALEATE 10 MG PO TABS
10.0000 mg | ORAL_TABLET | Freq: Once | ORAL | Status: AC
  Administered 2016-08-24: 10 mg via ORAL
  Filled 2016-08-24: qty 1

## 2016-08-24 MED ORDER — DIPHENHYDRAMINE HCL 25 MG PO CAPS
25.0000 mg | ORAL_CAPSULE | Freq: Once | ORAL | Status: AC
  Administered 2016-08-24: 25 mg via ORAL
  Filled 2016-08-24: qty 1

## 2016-08-24 MED ORDER — SODIUM CHLORIDE 0.9 % IV SOLN
16.0000 mg/kg | Freq: Once | INTRAVENOUS | Status: AC
  Administered 2016-08-24: 720 mg via INTRAVENOUS
  Filled 2016-08-24: qty 36

## 2016-08-24 MED ORDER — SODIUM CHLORIDE 0.9 % IV SOLN
2.0000 g | Freq: Once | INTRAVENOUS | Status: AC
  Administered 2016-08-24: 2 g via INTRAVENOUS
  Filled 2016-08-24: qty 20

## 2016-08-24 MED ORDER — ACETAMINOPHEN 325 MG PO TABS
650.0000 mg | ORAL_TABLET | Freq: Once | ORAL | Status: AC
Start: 2016-08-24 — End: 2016-08-24
  Administered 2016-08-24: 650 mg via ORAL
  Filled 2016-08-24: qty 2

## 2016-08-25 LAB — PROTEIN ELECTROPHORESIS, SERUM
A/G RATIO SPE: 1.6 (ref 0.7–1.7)
ALBUMIN ELP: 3.8 g/dL (ref 2.9–4.4)
ALPHA-1-GLOBULIN: 0.3 g/dL (ref 0.0–0.4)
Alpha-2-Globulin: 0.9 g/dL (ref 0.4–1.0)
Beta Globulin: 0.9 g/dL (ref 0.7–1.3)
GLOBULIN, TOTAL: 2.4 g/dL (ref 2.2–3.9)
Gamma Globulin: 0.3 g/dL — ABNORMAL LOW (ref 0.4–1.8)
M-SPIKE, %: 0.1 g/dL — AB
Total Protein ELP: 6.2 g/dL (ref 6.0–8.5)

## 2016-08-25 LAB — IGG, IGA, IGM
IGM, SERUM: 9 mg/dL — AB (ref 26–217)
IgA: 12 mg/dL — ABNORMAL LOW (ref 87–352)
IgG (Immunoglobin G), Serum: 401 mg/dL — ABNORMAL LOW (ref 700–1600)

## 2016-08-25 LAB — KAPPA/LAMBDA LIGHT CHAINS
Kappa free light chain: 2273.4 mg/L — ABNORMAL HIGH (ref 3.3–19.4)
Kappa, lambda light chain ratio: 284.18 — ABNORMAL HIGH (ref 0.26–1.65)
LAMDA FREE LIGHT CHAINS: 8 mg/L (ref 5.7–26.3)

## 2016-08-26 ENCOUNTER — Other Ambulatory Visit: Payer: Self-pay

## 2016-08-26 DIAGNOSIS — C9002 Multiple myeloma in relapse: Secondary | ICD-10-CM

## 2016-09-02 ENCOUNTER — Ambulatory Visit
Admission: RE | Admit: 2016-09-02 | Discharge: 2016-09-02 | Disposition: A | Discharge status: Home / Self Care | Payer: Self-pay | Source: Ambulatory Visit | Attending: Oncology | Admitting: Oncology

## 2016-09-02 DIAGNOSIS — M5127 Other intervertebral disc displacement, lumbosacral region: Secondary | ICD-10-CM | POA: Insufficient documentation

## 2016-09-02 DIAGNOSIS — C9002 Multiple myeloma in relapse: Secondary | ICD-10-CM | POA: Insufficient documentation

## 2016-09-02 DIAGNOSIS — M48061 Spinal stenosis, lumbar region without neurogenic claudication: Secondary | ICD-10-CM | POA: Insufficient documentation

## 2016-09-02 DIAGNOSIS — M5126 Other intervertebral disc displacement, lumbar region: Secondary | ICD-10-CM | POA: Insufficient documentation

## 2016-09-06 NOTE — Progress Notes (Signed)
[place]  Telephone:(336) [phone] Fax:(336) [phone]  ID: Jamie Keller OB: 04-25-1965  MR#: [id number]  DZH#:[id number]  [name], MD as PCP - General (Family Medicine)  CHIEF COMPLAINT: Multiple Myeloma in relapse with multiple bony lytic lesions, now with end-stage renal disease on dialysis.   INTERVAL HISTORY: Patient returns to clinic today for further evaluation and consideration of cycle 12 of single agent daratumumab. She is having worsening back pain as well as some neurologic symptoms down her leg. She also has worsening constipation. She denies any fever or chills. She has a fair appetite, but her weight has remained stable.  She has dyspnea on exertion, but denies cough, hemoptysis, or chest pain. She complains of mild nausea and occasional vomiting. Patient offers no further specific complaints today.   REVIEW OF SYSTEMS:   Review of Systems  Constitutional: Positive for malaise/fatigue. Negative for chills, fever and weight loss.  HENT: Negative for congestion.   Respiratory: Positive for shortness of breath. Negative for cough.   Cardiovascular: Negative.  Negative for chest pain and leg swelling.  Gastrointestinal: Negative for abdominal pain, blood in stool, constipation, diarrhea, nausea and vomiting.  Genitourinary: Negative for hematuria.       On dialysis  Musculoskeletal: Positive for back pain and myalgias.  Skin: Negative for itching and rash.  Neurological: Positive for tingling, sensory change and weakness. Negative for focal weakness and headaches.  Psychiatric/Behavioral: Negative for hallucinations. The patient is nervous/anxious and has insomnia.    As per HPI. Otherwise, a complete review of systems is negative.   PAST MEDICAL HISTORY: Past Medical History:  Diagnosis Date  . Anemia   . Anxiety   . Chronic kidney disease    DIALYSIS  . Depression   . Diabetes mellitus without  complication ([place])   . Dialysis patient ([place])    Mon, Wed, Fri  . History of chemotherapy    chemo on Tuesday and Wednesday  . Hypertension   . Multiple myeloma ([place])   . Neuropathy associated with cancer ([place])   . Osteoarthritis   . Pancreatitis   . Pneumonia    September 2017, [facility]  . Post-menopausal 2014  . Sepsis ([place])     PAST SURGICAL HISTORY: Past Surgical History:  Procedure Laterality Date  . AV FISTULA PLACEMENT Left 02/20/2016   Procedure: INSERTION OF ARTERIOVENOUS (AV) GORE-TEX GRAFT ARM;  Surgeon: Katha Cabal, MD;  Location: [facility] ORS;  Service: Vascular;  Laterality: Left;  . CESAREAN SECTION     x2  . dialysis catheter placement    . PERIPHERAL VASCULAR CATHETERIZATION N/A 11/19/2015   Procedure: Dialysis/Perma Catheter Insertion;  Surgeon: Algernon Huxley, MD;  Location: [place] [facility];  Service: Cardiovascular;  Laterality: N/A;  . PERIPHERAL VASCULAR CATHETERIZATION Left 03/10/2016   Procedure: Thrombectomy;  Surgeon: Katha Cabal, MD;  Location: [facility];  Service: Cardiovascular;  Laterality: Left;  . PORTACATH PLACEMENT    . TUBAL LIGATION      FAMILY HISTORY: Reviewed and unchanged. No reported history of malignancy or chronic disease.     ADVANCED DIRECTIVES:    HEALTH MAINTENANCE: Social History  Substance Use Topics  . Smoking status: Never Smoker  . Smokeless tobacco: Never Used  . Alcohol use No     No Known Allergies  Current Outpatient Prescriptions  Medication Sig Dispense Refill  . acetaminophen (TYLENOL) 500 MG tablet Take 500 mg by mouth every 6 (six) hours as needed.    Marland Kitchen  citalopram (CELEXA) 20 MG tablet Take 1 tablet (20 mg total) by mouth daily. 30 tablet 5  . HYDROcodone-acetaminophen (NORCO) 5-325 MG tablet Take 1-2 tablets by mouth every 6 (six) hours as needed for moderate pain or severe pain. 50 tablet 0  . Insulin Detemir (LEVEMIR FLEXPEN) 100 UNIT/ML Pen Inject 5 Units into the skin daily at 10 pm.     . VERAPAMIL HCL ER PO Take 120 mg by mouth daily.    Marland Kitchen dronabinol (MARINOL) 2.5 MG capsule Take 1 capsule (2.5 mg total) by mouth 2 (two) times daily before a meal. (Patient not taking: Reported on 03/16/2016) 60 capsule 0  . ferrous sulfate 325 (65 FE) MG tablet Take 325 mg by mouth daily with breakfast.    . furosemide (LASIX) 80 MG tablet Take 1 tablet (80 mg total) by mouth daily. (Patient not taking: Reported on 03/16/2016) 30 tablet 0  . promethazine (PHENERGAN) 25 MG tablet Take 25 mg by mouth every 6 (six) hours as needed for nausea or vomiting.     No current facility-administered medications for this visit.    Facility-Administered Medications Ordered in Other Visits  Medication Dose Route Frequency Provider Last Rate Last Dose  . heparin lock flush 100 unit/mL  500 Units Intravenous Once Lloyd Huger, MD      . sodium chloride flush (NS) 0.9 % injection 10 mL  10 mL Intravenous Once Lloyd Huger, MD        Vitals:   09/07/16 0915  BP: 126/69  Pulse: 88  Resp: 18  Temp: 98.1 F (36.7 C)   ECOG FS:1 - Symptomatic  General: No acute distress. Eyes: anicteric sclera. Lungs: Clear to auscultation bilaterally. Heart: Regular rate and rhythm. No rubs, murmurs, or gallops. Abdomen: Soft, nontender, nondistended. No organomegaly noted, normoactive bowel sounds. Musculoskeletal: No edema, cyanosis, or clubbing. Neuro: Alert, answering all questions appropriately. Cranial nerves grossly intact. Skin: No rashes or petechiae noted.  Psych: Normal affect.   LAB RESULTS:  CBC    Component Value Date/Time   WBC 7.0 09/07/2016 0843   RBC 3.23 (L) 09/07/2016 0843   HGB 10.7 (L) 09/07/2016 0843   HGB 11.7 (L) 06/27/2014 1344   HCT 30.3 (L) 09/07/2016 0843   HCT 34.0 (L) 06/27/2014 1344   PLT 193 09/07/2016 0843   PLT 267 06/27/2014 1344   MCV 93.7 09/07/2016 0843   MCV 88 06/27/2014 1344   MCH 33.0 09/07/2016 0843   MCHC 35.2 09/07/2016 0843   RDW 14.2  09/07/2016 0843   RDW 13.4 06/27/2014 1344   LYMPHSABS 0.7 (L) 09/07/2016 0843   LYMPHSABS 0.9 (L) 06/27/2014 1344   MONOABS 0.9 09/07/2016 0843   MONOABS 0.9 06/27/2014 1344   EOSABS 0.1 09/07/2016 0843   EOSABS 0.2 06/27/2014 1344   BASOSABS 0.1 09/07/2016 0843   BASOSABS 0.1 06/27/2014 1344   BMET    Component Value Date/Time   NA 136 09/07/2016 0843   NA 137 06/27/2014 1344   K 4.1 09/07/2016 0843   K 4.0 06/27/2014 1344   CL 94 (L) 09/07/2016 0843   CL 106 06/27/2014 1344   CO2 28 09/07/2016 0843   CO2 26 06/27/2014 1344   GLUCOSE 234 (H) 09/07/2016 0843   GLUCOSE 83 06/27/2014 1344   BUN 33 (H) 09/07/2016 0843   BUN 27 (H) 06/27/2014 1344   CREATININE 5.21 (H) 09/07/2016 0843   CREATININE 1.36 (H) 06/27/2014 1344   CALCIUM 7.8 (L) 09/07/2016 0843   CALCIUM  9.1 06/27/2014 1344   GFRNONAA 9 (L) 09/07/2016 0843   GFRNONAA 46 (L) 06/27/2014 1344   GFRAA 10 (L) 09/07/2016 0843   GFRAA 53 (L) 06/27/2014 1344   Lab Results  Component Value Date   TOTALPROTELP 6.2 08/24/2016   ALBUMINELP 3.8 08/24/2016   A1GS 0.3 08/24/2016   A2GS 0.9 08/24/2016   BETS 0.9 08/24/2016   GAMS 0.3 (L) 08/24/2016   MSPIKE 0.1 (H) 08/24/2016   SPEI Comment 08/24/2016        STUDIES: No results found.  ASSESSMENT: Multiple Myeloma in relapse with multiple bony lytic lesions, end-stage renal disease.   PLAN:    1. 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 This also may be the cause of her increase in pain as well as neurologic symptoms. 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 We will get a PET scan to further evaluate and a referral was given to radiation oncology for consideration of palliative treatment. Daratumumab does not need to be dose reduced in the setting of end-stage renal disease and dialysis. Hold Xgeva given her history of  hypocalcemia. Because of patient's immigration status, she could not undergo transplant. Proceed with cycle 12 today.  Continue treatment every 2 weeks through cycle 16 then we will switch to monthly maintenance until progression of disease. Return to clinic in 2 weeks for laboratory work, further evaluation and consideration of cycle 13.  2. Back pain: Continue current narcotics as prescribed. MRI, PET scan, and referral to radiation oncology as above. Patient was also given a prescription for Medrol Dosepak. 3. End-stage renal disease: Likely secondary to progression of disease. Continue dialysis on Monday, Wednesdays and Fridays. Has fistula. 4. Anemia: Hemoglobin decreased. Patient does not require transfusion at this time. Continue Procrit at dialysis as needed. 5. Hyperglycemia: Patient's blood glucose has improved since initiating insulin. Continue monitoring and treatment by primary care. 6. Thrombocytopenia: Resolved. 7. Depression: Continue Celexa 20 mg daily.  8. Weight Loss: Stable without medication. Patient stopped taking Marinol due to making her "feel funny". She states that she has an improved appetite without the medication. Will continue to monitor. 9. Constipation: Resolved. Take OTC medications if needed. Monitor.  10. Pruritus: Resolved. Patient states Benadryl works, but only for several hours. Continue Atarax as needed. 11. Confusion/hallucinations: MRI brian is negative. 12. Hypocalcemia: Patient received 2 g of IV calcium gluconate today. Case previously discussed with nephrology and they will attempt to increase her calcium with dialysis.   Interpreter present during entire clinic visit.  Patient expressed understanding and was in agreement with this plan. She also understands that She can call clinic at any time with any questions, concerns, or complaints.    Lloyd Huger, MD 09/07/16 11:13 AM

## 2016-09-07 ENCOUNTER — Inpatient Hospital Stay: Payer: Self-pay | Attending: Oncology

## 2016-09-07 ENCOUNTER — Inpatient Hospital Stay: Payer: Self-pay

## 2016-09-07 ENCOUNTER — Inpatient Hospital Stay (HOSPITAL_BASED_OUTPATIENT_CLINIC_OR_DEPARTMENT_OTHER): Payer: Self-pay | Admitting: Oncology

## 2016-09-07 VITALS — BP 126/69 | HR 88 | Temp 98.1°F | Resp 18 | Wt 104.6 lb

## 2016-09-07 DIAGNOSIS — D649 Anemia, unspecified: Secondary | ICD-10-CM

## 2016-09-07 DIAGNOSIS — R410 Disorientation, unspecified: Secondary | ICD-10-CM | POA: Insufficient documentation

## 2016-09-07 DIAGNOSIS — Z79899 Other long term (current) drug therapy: Secondary | ICD-10-CM | POA: Insufficient documentation

## 2016-09-07 DIAGNOSIS — R634 Abnormal weight loss: Secondary | ICD-10-CM

## 2016-09-07 DIAGNOSIS — F419 Anxiety disorder, unspecified: Secondary | ICD-10-CM

## 2016-09-07 DIAGNOSIS — E1165 Type 2 diabetes mellitus with hyperglycemia: Secondary | ICD-10-CM | POA: Insufficient documentation

## 2016-09-07 DIAGNOSIS — C9002 Multiple myeloma in relapse: Secondary | ICD-10-CM

## 2016-09-07 DIAGNOSIS — N186 End stage renal disease: Secondary | ICD-10-CM

## 2016-09-07 DIAGNOSIS — Z794 Long term (current) use of insulin: Secondary | ICD-10-CM | POA: Insufficient documentation

## 2016-09-07 DIAGNOSIS — F329 Major depressive disorder, single episode, unspecified: Secondary | ICD-10-CM | POA: Insufficient documentation

## 2016-09-07 DIAGNOSIS — Z5112 Encounter for antineoplastic immunotherapy: Secondary | ICD-10-CM | POA: Insufficient documentation

## 2016-09-07 DIAGNOSIS — K5900 Constipation, unspecified: Secondary | ICD-10-CM | POA: Insufficient documentation

## 2016-09-07 DIAGNOSIS — Z992 Dependence on renal dialysis: Secondary | ICD-10-CM | POA: Insufficient documentation

## 2016-09-07 DIAGNOSIS — I129 Hypertensive chronic kidney disease with stage 1 through stage 4 chronic kidney disease, or unspecified chronic kidney disease: Secondary | ICD-10-CM

## 2016-09-07 DIAGNOSIS — M549 Dorsalgia, unspecified: Secondary | ICD-10-CM

## 2016-09-07 DIAGNOSIS — M1990 Unspecified osteoarthritis, unspecified site: Secondary | ICD-10-CM

## 2016-09-07 LAB — CBC WITH DIFFERENTIAL/PLATELET
BASOS ABS: 0.1 10*3/uL (ref 0–0.1)
BASOS PCT: 1 %
EOS ABS: 0.1 10*3/uL (ref 0–0.7)
Eosinophils Relative: 1 %
HEMATOCRIT: 30.3 % — AB (ref 35.0–47.0)
Hemoglobin: 10.7 g/dL — ABNORMAL LOW (ref 12.0–16.0)
Lymphocytes Relative: 10 %
Lymphs Abs: 0.7 10*3/uL — ABNORMAL LOW (ref 1.0–3.6)
MCH: 33 pg (ref 26.0–34.0)
MCHC: 35.2 g/dL (ref 32.0–36.0)
MCV: 93.7 fL (ref 80.0–100.0)
MONO ABS: 0.9 10*3/uL (ref 0.2–0.9)
Monocytes Relative: 13 %
NEUTROS ABS: 5.3 10*3/uL (ref 1.4–6.5)
Neutrophils Relative %: 75 %
PLATELETS: 193 10*3/uL (ref 150–440)
RBC: 3.23 MIL/uL — ABNORMAL LOW (ref 3.80–5.20)
RDW: 14.2 % (ref 11.5–14.5)
WBC: 7 10*3/uL (ref 3.6–11.0)

## 2016-09-07 LAB — COMPREHENSIVE METABOLIC PANEL
ALBUMIN: 3.7 g/dL (ref 3.5–5.0)
ALT: 10 U/L — ABNORMAL LOW (ref 14–54)
ANION GAP: 14 (ref 5–15)
AST: 21 U/L (ref 15–41)
Alkaline Phosphatase: 172 U/L — ABNORMAL HIGH (ref 38–126)
BUN: 33 mg/dL — AB (ref 6–20)
CHLORIDE: 94 mmol/L — AB (ref 101–111)
CO2: 28 mmol/L (ref 22–32)
Calcium: 7.8 mg/dL — ABNORMAL LOW (ref 8.9–10.3)
Creatinine, Ser: 5.21 mg/dL — ABNORMAL HIGH (ref 0.44–1.00)
GFR calc Af Amer: 10 mL/min — ABNORMAL LOW (ref >60)
GFR calc non Af Amer: 9 mL/min — ABNORMAL LOW (ref >60)
GLUCOSE: 234 mg/dL — AB (ref 65–99)
POTASSIUM: 4.1 mmol/L (ref 3.5–5.1)
SODIUM: 136 mmol/L (ref 135–145)
Total Bilirubin: 0.5 mg/dL (ref 0.3–1.2)
Total Protein: 6.6 g/dL (ref 6.5–8.1)

## 2016-09-07 LAB — MAGNESIUM: Magnesium: 1.9 mg/dL (ref 1.7–2.4)

## 2016-09-07 MED ORDER — DIPHENHYDRAMINE HCL 25 MG PO CAPS
25.0000 mg | ORAL_CAPSULE | Freq: Once | ORAL | Status: AC
  Administered 2016-09-07: 25 mg via ORAL

## 2016-09-07 MED ORDER — SODIUM CHLORIDE 0.9 % IV SOLN
Freq: Once | INTRAVENOUS | Status: AC
  Administered 2016-09-07: 11:00:00 via INTRAVENOUS
  Filled 2016-09-07: qty 1000

## 2016-09-07 MED ORDER — PREDNISONE 5 MG (21) PO TBPK: ORAL_TABLET | ORAL | 0 refills | Status: DC

## 2016-09-07 MED ORDER — ACETAMINOPHEN 325 MG PO TABS
650.0000 mg | ORAL_TABLET | Freq: Once | ORAL | Status: AC
  Administered 2016-09-07: 650 mg via ORAL

## 2016-09-07 MED ORDER — PROCHLORPERAZINE MALEATE 10 MG PO TABS
10.0000 mg | ORAL_TABLET | Freq: Once | ORAL | Status: AC
  Administered 2016-09-07: 10 mg via ORAL

## 2016-09-07 MED ORDER — PROCHLORPERAZINE MALEATE 10 MG PO TABS: 10.0000 mg | ORAL_TABLET | Freq: Four times a day (QID) | ORAL | 1 refills | Status: AC | PRN

## 2016-09-07 MED ORDER — SODIUM CHLORIDE 0.9% FLUSH
10.0000 mL | INTRAVENOUS | Status: DC | PRN
  Administered 2016-09-07: 10 mL via INTRAVENOUS
  Filled 2016-09-07: qty 10

## 2016-09-07 MED ORDER — HEPARIN SOD (PORK) LOCK FLUSH 100 UNIT/ML IV SOLN
500.0000 [IU] | Freq: Once | INTRAVENOUS | Status: AC
  Administered 2016-09-07: 500 [IU] via INTRAVENOUS

## 2016-09-07 MED ORDER — SODIUM CHLORIDE 0.9 % IV SOLN
16.0000 mg/kg | Freq: Once | INTRAVENOUS | Status: AC
  Administered 2016-09-07: 720 mg via INTRAVENOUS
  Filled 2016-09-07: qty 36

## 2016-09-07 MED ORDER — METHYLPREDNISOLONE SODIUM SUCC 125 MG IJ SOLR
125.0000 mg | Freq: Once | INTRAMUSCULAR | Status: AC
  Administered 2016-09-07: 125 mg via INTRAVENOUS

## 2016-09-07 MED ORDER — SODIUM CHLORIDE 0.9 % IV SOLN
2.0000 g | Freq: Once | INTRAVENOUS | Status: AC
  Administered 2016-09-07: 2 g via INTRAVENOUS
  Filled 2016-09-07: qty 20

## 2016-09-07 NOTE — Progress Notes (Signed)
Patient is here today for follow up, she is complaining she has not had a bowel movement in about 6 days, she was given senna and this does not help. She mentions she has nausea and would like to discuss the zofran and compazine. She also mentions some numbness in her feet.

## 2016-09-08 LAB — IGG, IGA, IGM
IGA: 10 mg/dL — AB (ref 87–352)
IGG (IMMUNOGLOBIN G), SERUM: 347 mg/dL — AB (ref 700–1600)
IGM, SERUM: 7 mg/dL — AB (ref 26–217)

## 2016-09-09 LAB — PROTEIN ELECTROPHORESIS, SERUM
A/G Ratio: 1.4 (ref 0.7–1.7)
ALBUMIN ELP: 3.5 g/dL (ref 2.9–4.4)
Alpha-1-Globulin: 0.3 g/dL (ref 0.0–0.4)
Alpha-2-Globulin: 0.9 g/dL (ref 0.4–1.0)
BETA GLOBULIN: 0.9 g/dL (ref 0.7–1.3)
GAMMA GLOBULIN: 0.3 g/dL — AB (ref 0.4–1.8)
Globulin, Total: 2.5 g/dL (ref 2.2–3.9)
M-SPIKE, %: 0.1 g/dL — AB
TOTAL PROTEIN ELP: 6 g/dL (ref 6.0–8.5)

## 2016-09-09 LAB — KAPPA/LAMBDA LIGHT CHAINS
KAPPA FREE LGHT CHN: 5476.7 mg/L — AB (ref 3.3–19.4)
Kappa, lambda light chain ratio: 1404.28 — ABNORMAL HIGH (ref 0.26–1.65)
Lambda free light chains: 3.9 mg/L — ABNORMAL LOW (ref 5.7–26.3)

## 2016-09-13 ENCOUNTER — Other Ambulatory Visit: Payer: Self-pay

## 2016-09-13 ENCOUNTER — Telehealth: Payer: Self-pay | Admitting: *Deleted

## 2016-09-13 ENCOUNTER — Encounter: Payer: Self-pay | Admitting: Radiation Oncology

## 2016-09-13 ENCOUNTER — Ambulatory Visit
Admission: RE | Admit: 2016-09-13 | Discharge: 2016-09-13 | Disposition: A | Discharge status: Home / Self Care | Payer: Self-pay | Source: Ambulatory Visit | Attending: Radiation Oncology | Admitting: Radiation Oncology

## 2016-09-13 VITALS — BP 136/70 | HR 91 | Temp 96.6°F | Wt 106.8 lb

## 2016-09-13 DIAGNOSIS — C9002 Multiple myeloma in relapse: Secondary | ICD-10-CM

## 2016-09-13 DIAGNOSIS — C9000 Multiple myeloma not having achieved remission: Secondary | ICD-10-CM | POA: Insufficient documentation

## 2016-09-13 DIAGNOSIS — G629 Polyneuropathy, unspecified: Secondary | ICD-10-CM | POA: Insufficient documentation

## 2016-09-13 DIAGNOSIS — G893 Neoplasm related pain (acute) (chronic): Secondary | ICD-10-CM | POA: Insufficient documentation

## 2016-09-13 DIAGNOSIS — Z923 Personal history of irradiation: Secondary | ICD-10-CM | POA: Insufficient documentation

## 2016-09-13 NOTE — Telephone Encounter (Signed)
Inquiring if the PET order needs to be changed to whole body as it was last time, Please advise

## 2016-09-13 NOTE — Progress Notes (Signed)
Patient complains with numbness in the right leg in areas of the thigh, hip and knee.

## 2016-09-13 NOTE — Telephone Encounter (Signed)
They state MM gets whole body PET, please advise

## 2016-09-13 NOTE — Telephone Encounter (Signed)
Should be restaging MM, base of skull to mid thigh.

## 2016-09-13 NOTE — Progress Notes (Signed)
Radiation Oncology Follow up Note  Name: Jamie Keller   Date:   09/13/2016 MRN:  [id number] DOB: August 12, 1965    This 51 y.o. female presents to the clinic today for patient is a 51 year old female previous treated with palliative radiation therapy to her lumbar spine for involvement of multiple myeloma.. She is currently oncycle 12 of single agent daratumumab. 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 She scheduled for PET CT scan tomorrow. I've asked to evaluate her for possible further palliative radiation therapy. She states she has some slight numbness and decreased sensory levels in her lower extremities. She is ambulating well. No one specific site of pain.  REFERRING PROVIDER: Theotis Burrow*  HPI: As above.  COMPLICATIONS OF TREATMENT: none  FOLLOW UP COMPLIANCE: keeps appointments   PHYSICAL EXAM:  BP 136/70   Pulse 91   Temp (!) 96.6 F (35.9 C)   Wt 106 lb 13 oz (48.5 kg)   LMP 02/25/1995 Comment: Tubal ligation 1996  BMI 21.57 kg/m  Motor sensory and DTR levels are equal and symmetric in the lower extremities. Range of motion does not elicit pain. Well-developed well-nourished patient in NAD. HEENT reveals PERLA, EOMI, discs not visualized.  Oral cavity is clear. No oral mucosal lesions are identified. Neck is clear without evidence of cervical or supraclavicular adenopathy. Lungs are clear to A&P. Cardiac examination is essentially unremarkable with regular rate and rhythm without murmur rub or thrill. Abdomen is benign with no organomegaly or masses noted. Motor sensory and DTR levels are equal and symmetric in the upper and lower extremities. Cranial nerves II through XII are grossly intact. Proprioception is intact. No peripheral adenopathy or edema is identified. No motor or sensory levels are noted. Crude  visual fields are within normal range.  RADIOLOGY RESULTS: MRI scans are reviewed PET CT scan will be reviewed when available  PLAN: At this time of asked to see the patient back next week when I can evaluate her PET/CT scan for determination if there is any areas that require further palliative radiation therapy. She is really not complaining that much of hip pain although does state that there is some vague left flank discomfort. Depending on PET/CT findings may offer further palliative radiation therapy. Will discuss this next week in her follow-up visit. We'll also discussed the case personally with medical oncology.  I would like to take this opportunity to thank you for allowing me to participate in the care of your patient.Armstead Peaks., MD

## 2016-09-13 NOTE — Telephone Encounter (Signed)
New order has been placed. 

## 2016-09-14 ENCOUNTER — Ambulatory Visit
Admission: RE | Admit: 2016-09-14 | Discharge: 2016-09-14 | Disposition: A | Discharge status: Home / Self Care | Payer: Self-pay | Source: Ambulatory Visit | Attending: Oncology | Admitting: Oncology

## 2016-09-14 ENCOUNTER — Other Ambulatory Visit: Payer: Self-pay | Admitting: *Deleted

## 2016-09-14 DIAGNOSIS — C9002 Multiple myeloma in relapse: Secondary | ICD-10-CM | POA: Insufficient documentation

## 2016-09-14 DIAGNOSIS — N186 End stage renal disease: Secondary | ICD-10-CM | POA: Insufficient documentation

## 2016-09-14 DIAGNOSIS — C9000 Multiple myeloma not having achieved remission: Secondary | ICD-10-CM | POA: Insufficient documentation

## 2016-09-14 LAB — GLUCOSE, CAPILLARY: Glucose-Capillary: 123 mg/dL — ABNORMAL HIGH (ref 65–99)

## 2016-09-14 MED ORDER — FLUDEOXYGLUCOSE F - 18 (FDG) INJECTION
12.0000 | Freq: Once | INTRAVENOUS | Status: AC | PRN
  Administered 2016-09-14: 12.24 via INTRAVENOUS

## 2016-09-14 MED ORDER — OXYCODONE HCL 10 MG PO TABS: 10.0000 mg | ORAL_TABLET | ORAL | 0 refills | Status: DC | PRN

## 2016-09-17 ENCOUNTER — Telehealth: Payer: Self-pay | Admitting: *Deleted

## 2016-09-17 NOTE — Telephone Encounter (Signed)
Patient has progression of disease and would prefer to discuss the results and treatment planning in person.

## 2016-09-17 NOTE — Telephone Encounter (Signed)
Left message for Kennyth Lose that he will discuss in person at her appt Tuesday

## 2016-09-17 NOTE — Telephone Encounter (Signed)
Patient called wanting results of her scan which she was told we would call her about. She does not answer her phone in dialysis and thinks she missed a call from [place]. Please call Jamie Keller to do a conference call regarding her results. [phone]  Her next appt is on 7/17   IMPRESSION: 1. Widespread and worsening bony myeloma lesions throughout the visualized axial and appendicular skeleton, with increase in size and number of lucent lesions and increase in associated metabolic activity. 2. Increase in soft tissue masses including along the left tenth intercostal space of the pleura; medial to the spleen; and extending from the left iliac crest into these adjacent muscular soft tissues. 3. Prior ascites, pleural effusions, and subcutaneous edema have resolved.   Electronically Signed   By: Van Clines M.D.   On: 09/14/2016 13:45

## 2016-09-19 NOTE — Progress Notes (Signed)
[place]  [phone]) [phone] Fax:(336) [phone]  ID: [name] OB: July 02, 1965  MR#: [id number]  QQI#:[id number]  [name], MD as PCP - General (Family Medicine)  CHIEF COMPLAINT: Multiple Myeloma in relapse with multiple bony lytic lesions, now with end-stage renal disease on dialysis.   INTERVAL HISTORY: Patient returns to clinic today for further evaluation, discussion of her imaging results, and discussion of switching treatment. She reports her pain is not adequately controlled on her current narcotic regimen. She continues to have constipation. She continues to be highly anxious. She denies any fever or chills. She has a fair appetite, but her weight has remained stable.  She has dyspnea on exertion, but denies cough, hemoptysis, or chest pain. She complains of mild nausea and occasional vomiting. Patient offers no further specific complaints today.   REVIEW OF SYSTEMS:   Review of Systems  Constitutional: Positive for malaise/fatigue. Negative for chills, fever and weight loss.  HENT: Negative for congestion.   Respiratory: Positive for shortness of breath. Negative for cough.   Cardiovascular: Negative.  Negative for chest pain and leg swelling.  Gastrointestinal: Negative for abdominal pain, blood in stool, constipation, diarrhea, nausea and vomiting.  Genitourinary: Negative for hematuria.       On dialysis  Musculoskeletal: Positive for back pain and myalgias.  Skin: Negative for itching and rash.  Neurological: Positive for tingling, sensory change and weakness. Negative for focal weakness and headaches.  Psychiatric/Behavioral: Negative for hallucinations. The patient is nervous/anxious and has insomnia.    As per HPI. Otherwise, a complete review of systems is negative.   PAST MEDICAL HISTORY: Past Medical History:  Diagnosis Date  . Anemia   . Anxiety   . Chronic kidney disease    DIALYSIS  .  Depression   . Diabetes mellitus without complication ([place])   . Dialysis patient ([place])    Mon, Wed, Fri  . History of chemotherapy    chemo on Tuesday and Wednesday  . Hypertension   . Multiple myeloma ([place])   . Neuropathy associated with cancer ([place])   . Osteoarthritis   . Pancreatitis   . Pneumonia    September 2017, [facility]  . Post-menopausal 2014  . Sepsis ([place])     PAST SURGICAL HISTORY: Past Surgical History:  Procedure Laterality Date  . AV FISTULA PLACEMENT Left 02/20/2016   Procedure: INSERTION OF ARTERIOVENOUS (AV) GORE-TEX GRAFT ARM;  Surgeon: Katha Cabal, MD;  Location: [facility] ORS;  Service: Vascular;  Laterality: Left;  . CESAREAN SECTION     x2  . dialysis catheter placement    . PERIPHERAL VASCULAR CATHETERIZATION N/A 11/19/2015   Procedure: Dialysis/Perma Catheter Insertion;  Surgeon: Algernon Huxley, MD;  Location: [place] [facility];  Service: Cardiovascular;  Laterality: N/A;  . PERIPHERAL VASCULAR CATHETERIZATION Left 03/10/2016   Procedure: Thrombectomy;  Surgeon: Katha Cabal, MD;  Location: [place] [facility];  Service: Cardiovascular;  Laterality: Left;  . PORTACATH PLACEMENT    . TUBAL LIGATION      FAMILY HISTORY: Reviewed and unchanged. No reported history of malignancy or chronic disease.     ADVANCED DIRECTIVES:    HEALTH MAINTENANCE: Social History  Substance Use Topics  . Smoking status: Never Smoker  . Smokeless tobacco: Never Used  . Alcohol use No     No Known Allergies  Current Outpatient Prescriptions  Medication Sig Dispense Refill  . acetaminophen (TYLENOL) 500 MG tablet Take 500 mg by mouth every 6 (  six) hours as needed.    . citalopram (CELEXA) 20 MG tablet Take 1 tablet (20 mg total) by mouth daily. 30 tablet 5  . HYDROcodone-acetaminophen (NORCO) 5-325 MG tablet Take 1-2 tablets by mouth every 6 (six) hours as needed for moderate pain or severe pain. 50 tablet 0  . Insulin Detemir (LEVEMIR FLEXPEN) 100 UNIT/ML Pen  Inject 5 Units into the skin daily at 10 pm.    . VERAPAMIL HCL ER PO Take 120 mg by mouth daily.    Marland Kitchen dronabinol (MARINOL) 2.5 MG capsule Take 1 capsule (2.5 mg total) by mouth 2 (two) times daily before a meal. (Patient not taking: Reported on 03/16/2016) 60 capsule 0  . ferrous sulfate 325 (65 FE) MG tablet Take 325 mg by mouth daily with breakfast.    . furosemide (LASIX) 80 MG tablet Take 1 tablet (80 mg total) by mouth daily. (Patient not taking: Reported on 03/16/2016) 30 tablet 0  . promethazine (PHENERGAN) 25 MG tablet Take 25 mg by mouth every 6 (six) hours as needed for nausea or vomiting.     No current facility-administered medications for this visit.    Facility-Administered Medications Ordered in Other Visits  Medication Dose Route Frequency Provider Last Rate Last Dose  . heparin lock flush 100 unit/mL  500 Units Intravenous Once Lloyd Huger, MD      . sodium chloride flush (NS) 0.9 % injection 10 mL  10 mL Intravenous Once Lloyd Huger, MD        There were no vitals filed for this visit. ECOG FS:1 - Symptomatic  General: No acute distress. Eyes: anicteric sclera. Lungs: Clear to auscultation bilaterally. Heart: Regular rate and rhythm. No rubs, murmurs, or gallops. Abdomen: Soft, nontender, nondistended. No organomegaly noted, normoactive bowel sounds. Musculoskeletal: No edema, cyanosis, or clubbing. Neuro: Alert, answering all questions appropriately. Cranial nerves grossly intact. Skin: No rashes or petechiae noted.  Psych: Normal affect.   LAB RESULTS:  CBC    Component Value Date/Time   WBC 7.2 09/21/2016 0920   RBC 3.45 (L) 09/21/2016 0920   HGB 11.1 (L) 09/21/2016 0920   HGB 11.7 (L) 06/27/2014 1344   HCT 32.4 (L) 09/21/2016 0920   HCT 34.0 (L) 06/27/2014 1344   PLT 229 09/21/2016 0920   PLT 267 06/27/2014 1344   MCV 93.7 09/21/2016 0920   MCV 88 06/27/2014 1344   MCH 32.2 09/21/2016 0920   MCHC 34.4 09/21/2016 0920   RDW 15.4 (H)  09/21/2016 0920   RDW 13.4 06/27/2014 1344   LYMPHSABS 0.6 (L) 09/21/2016 0920   LYMPHSABS 0.9 (L) 06/27/2014 1344   MONOABS 1.1 (H) 09/21/2016 0920   MONOABS 0.9 06/27/2014 1344   EOSABS 0.0 09/21/2016 0920   EOSABS 0.2 06/27/2014 1344   BASOSABS 0.0 09/21/2016 0920   BASOSABS 0.1 06/27/2014 1344   BMET    Component Value Date/Time   NA 135 09/21/2016 0920   NA 137 06/27/2014 1344   K 5.3 (H) 09/21/2016 0920   K 4.0 06/27/2014 1344   CL 92 (L) 09/21/2016 0920   CL 106 06/27/2014 1344   CO2 30 09/21/2016 0920   CO2 26 06/27/2014 1344   GLUCOSE 266 (H) 09/21/2016 0920   GLUCOSE 83 06/27/2014 1344   BUN 42 (H) 09/21/2016 0920   BUN 27 (H) 06/27/2014 1344   CREATININE 5.43 (H) 09/21/2016 0920   CREATININE 1.36 (H) 06/27/2014 1344   CALCIUM 8.8 (L) 09/21/2016 0920   CALCIUM 9.1 06/27/2014 1344  GFRNONAA 8 (L) 09/21/2016 0920   GFRNONAA 46 (L) 06/27/2014 1344   GFRAA 10 (L) 09/21/2016 0920   GFRAA 53 (L) 06/27/2014 1344   Lab Results  Component Value Date   TOTALPROTELP 6.3 09/21/2016   ALBUMINELP 3.4 09/21/2016   A1GS 0.3 09/21/2016   A2GS 1.1 (H) 09/21/2016   BETS 1.1 09/21/2016   GAMS 0.3 (L) 09/21/2016   MSPIKE 0.3 (H) 09/21/2016   SPEI Comment 09/21/2016        STUDIES: No results found.  ASSESSMENT: Multiple Myeloma in relapse with multiple bony lytic lesions, end-stage renal disease.   PLAN:    1. Multiple Myeloma in relapse with multiple bony lytic lesions: PET scan results reviewed independently with significant progression of disease. Also, her kappa free light chains have increased significantly to greater then 9800. Will discontinue Daratumumab time and proceed with oral chemotherapy using 3 mgs Ixazomib on days 1, 8, and 15 with day 22 off. Patient will also take 5 mg Revlimid daily. Both medications have been dose reduced in the setting of end-stage renal disease. Will not give dexamethasone given her poorly controlled diabetes. Hold Xgeva given her  history of hypocalcemia. Because of patient's immigration status, she could not undergo transplant. Return to clinic in 2-3 weeks for further evaluation and to assess her toleration of treatment. 2. Back pain: Patient was given a prescription for fentanyl patch today. She has also been evaluated by radiation oncology who did not feel additional XRT would be helpful.  3. End-stage renal disease: Likely secondary to progression of disease. Continue dialysis on Monday, Wednesdays and Fridays. Has fistula. 4. Anemia: Hemoglobin decreased. Patient does not require transfusion at this time. Continue Procrit at dialysis as needed. 5. Hyperglycemia: Patient's blood glucose has improved since initiating insulin. Continue monitoring and treatment by primary care. No dexamethasone as above. 6. Thrombocytopenia: Resolved. 7. Depression: Continue Celexa 20 mg daily.  8. Constipation: Continue OTC medications if needed. Monitor.  9. Hypocalcemia: Improved. Patient does not require 2 g of IV calcium gluconate today. Case previously discussed with nephrology and they will attempt to increase her calcium with dialysis. No Xgeva as above. 10. Prognosis: Also discussed the possibility of hospice and of life care today. Patient is not interested in pursuing this at this time and wishes to continue with aggressive treatment. She did acknowledge that her treatment options are limited and end-of-life care we will likely need to be discussed again in the future.  Interpreter present during entire clinic visit.  Patient expressed understanding and was in agreement with this plan. She also understands that She can call clinic at any time with any questions, concerns, or complaints.    Lloyd Huger, MD 09/26/16 9:01 AM

## 2016-09-21 ENCOUNTER — Inpatient Hospital Stay: Payer: Self-pay

## 2016-09-21 ENCOUNTER — Ambulatory Visit
Admission: RE | Admit: 2016-09-21 | Discharge: 2016-09-21 | Disposition: A | Discharge status: Home / Self Care | Payer: Self-pay | Source: Ambulatory Visit | Attending: Radiation Oncology | Admitting: Radiation Oncology

## 2016-09-21 ENCOUNTER — Inpatient Hospital Stay (HOSPITAL_BASED_OUTPATIENT_CLINIC_OR_DEPARTMENT_OTHER): Payer: Self-pay | Admitting: Oncology

## 2016-09-21 ENCOUNTER — Encounter: Payer: Self-pay | Admitting: Radiation Oncology

## 2016-09-21 VITALS — BP 129/66 | HR 93 | Temp 97.8°F | Wt 104.3 lb

## 2016-09-21 DIAGNOSIS — M1990 Unspecified osteoarthritis, unspecified site: Secondary | ICD-10-CM

## 2016-09-21 DIAGNOSIS — Z79899 Other long term (current) drug therapy: Secondary | ICD-10-CM

## 2016-09-21 DIAGNOSIS — M549 Dorsalgia, unspecified: Secondary | ICD-10-CM

## 2016-09-21 DIAGNOSIS — C9002 Multiple myeloma in relapse: Secondary | ICD-10-CM

## 2016-09-21 DIAGNOSIS — Z992 Dependence on renal dialysis: Secondary | ICD-10-CM

## 2016-09-21 DIAGNOSIS — K5900 Constipation, unspecified: Secondary | ICD-10-CM

## 2016-09-21 DIAGNOSIS — F419 Anxiety disorder, unspecified: Secondary | ICD-10-CM

## 2016-09-21 DIAGNOSIS — C9000 Multiple myeloma not having achieved remission: Secondary | ICD-10-CM | POA: Insufficient documentation

## 2016-09-21 DIAGNOSIS — R410 Disorientation, unspecified: Secondary | ICD-10-CM

## 2016-09-21 DIAGNOSIS — F329 Major depressive disorder, single episode, unspecified: Secondary | ICD-10-CM

## 2016-09-21 DIAGNOSIS — G893 Neoplasm related pain (acute) (chronic): Secondary | ICD-10-CM | POA: Insufficient documentation

## 2016-09-21 DIAGNOSIS — Z794 Long term (current) use of insulin: Secondary | ICD-10-CM

## 2016-09-21 DIAGNOSIS — D649 Anemia, unspecified: Secondary | ICD-10-CM

## 2016-09-21 DIAGNOSIS — E1165 Type 2 diabetes mellitus with hyperglycemia: Secondary | ICD-10-CM

## 2016-09-21 DIAGNOSIS — R634 Abnormal weight loss: Secondary | ICD-10-CM

## 2016-09-21 DIAGNOSIS — N186 End stage renal disease: Secondary | ICD-10-CM

## 2016-09-21 DIAGNOSIS — Z923 Personal history of irradiation: Secondary | ICD-10-CM | POA: Insufficient documentation

## 2016-09-21 DIAGNOSIS — I129 Hypertensive chronic kidney disease with stage 1 through stage 4 chronic kidney disease, or unspecified chronic kidney disease: Secondary | ICD-10-CM

## 2016-09-21 LAB — COMPREHENSIVE METABOLIC PANEL
ALT: 12 U/L — ABNORMAL LOW (ref 14–54)
ANION GAP: 13 (ref 5–15)
AST: 25 U/L (ref 15–41)
Albumin: 3.7 g/dL (ref 3.5–5.0)
Alkaline Phosphatase: 144 U/L — ABNORMAL HIGH (ref 38–126)
BUN: 42 mg/dL — ABNORMAL HIGH (ref 6–20)
CHLORIDE: 92 mmol/L — AB (ref 101–111)
CO2: 30 mmol/L (ref 22–32)
Calcium: 8.8 mg/dL — ABNORMAL LOW (ref 8.9–10.3)
Creatinine, Ser: 5.43 mg/dL — ABNORMAL HIGH (ref 0.44–1.00)
GFR, EST AFRICAN AMERICAN: 10 mL/min — AB (ref >60)
GFR, EST NON AFRICAN AMERICAN: 8 mL/min — AB (ref >60)
Glucose, Bld: 266 mg/dL — ABNORMAL HIGH (ref 65–99)
POTASSIUM: 5.3 mmol/L — AB (ref 3.5–5.1)
Sodium: 135 mmol/L (ref 135–145)
TOTAL PROTEIN: 7.3 g/dL (ref 6.5–8.1)
Total Bilirubin: 0.7 mg/dL (ref 0.3–1.2)

## 2016-09-21 LAB — CBC WITH DIFFERENTIAL/PLATELET
BASOS ABS: 0 10*3/uL (ref 0–0.1)
Basophils Relative: 1 %
EOS PCT: 1 %
Eosinophils Absolute: 0 10*3/uL (ref 0–0.7)
HCT: 32.4 % — ABNORMAL LOW (ref 35.0–47.0)
Hemoglobin: 11.1 g/dL — ABNORMAL LOW (ref 12.0–16.0)
LYMPHS ABS: 0.6 10*3/uL — AB (ref 1.0–3.6)
LYMPHS PCT: 9 %
MCH: 32.2 pg (ref 26.0–34.0)
MCHC: 34.4 g/dL (ref 32.0–36.0)
MCV: 93.7 fL (ref 80.0–100.0)
MONO ABS: 1.1 10*3/uL — AB (ref 0.2–0.9)
MONOS PCT: 15 %
Neutro Abs: 5.4 10*3/uL (ref 1.4–6.5)
Neutrophils Relative %: 74 %
PLATELETS: 229 10*3/uL (ref 150–440)
RBC: 3.45 MIL/uL — ABNORMAL LOW (ref 3.80–5.20)
RDW: 15.4 % — AB (ref 11.5–14.5)
WBC: 7.2 10*3/uL (ref 3.6–11.0)

## 2016-09-21 LAB — MAGNESIUM: MAGNESIUM: 2 mg/dL (ref 1.7–2.4)

## 2016-09-21 MED ORDER — IXAZOMIB CITRATE 3 MG PO CAPS: 3.0000 mg | ORAL_CAPSULE | ORAL | 3 refills | Status: DC

## 2016-09-21 MED ORDER — FENTANYL 25 MCG/HR TD PT72: 25.0000 ug | MEDICATED_PATCH | TRANSDERMAL | 0 refills | Status: DC

## 2016-09-21 MED ORDER — LENALIDOMIDE 5 MG PO CAPS: 5.0000 mg | ORAL_CAPSULE | Freq: Every day | ORAL | 5 refills | Status: DC

## 2016-09-21 NOTE — Progress Notes (Signed)
Radiation Oncology Follow up Note  Name: Jamie Keller   Date:   09/21/2016 MRN:  [id number] DOB: 1965/08/27    This 51 y.o. female presents to the clinic today for reevaluation for widespread involvement of multiple myeloma.  REFERRING PROVIDER: Theotis Burrow*  HPI: Patient is a 51 year old female previously treated with palliative radiation therapy to her lumbar spine for multiple myeloma. I recently saw her for evaluation of increasing pain was noted to have a lesion in the region of the spleen. We ordered a PET CT scan for better delineation of tumor involvement. And PET/CT scan showed widespread worsening bony metastasis throughout the axial and appendicular skeleton. We'll also lesion around the spleen was increased soft-soft tissue mass extending from the iliac crest into adjacent muscular soft tissue. She complains of body wide pain knees arms back. She is also seeing medical oncology today.  COMPLICATIONS OF TREATMENT: none  FOLLOW UP COMPLIANCE: keeps appointments   PHYSICAL EXAM:  BP 129/66   Pulse 93   Temp 97.8 F (36.6 C)   Wt 104 lb 4.4 oz (47.3 kg)   LMP 02/25/1995 Comment: Tubal ligation 1996  BMI 21.06 kg/m  Well-developed well-nourished patient in NAD. HEENT reveals PERLA, EOMI, discs not visualized.  Oral cavity is clear. No oral mucosal lesions are identified. Neck is clear without evidence of cervical or supraclavicular adenopathy. Lungs are clear to A&P. Cardiac examination is essentially unremarkable with regular rate and rhythm without murmur rub or thrill. Abdomen is benign with no organomegaly or masses noted. Motor sensory and DTR levels are equal and symmetric in the upper and lower extremities. Cranial nerves II through XII are grossly intact. Proprioception is intact. No peripheral adenopathy or edema is identified. No motor or sensory levels are noted. Crude visual fields are within normal range.  RADIOLOGY RESULTS: PET CT scan  reviewed  PLAN: Present time I discussed the case with medical oncology. Really see no benefit to palliative radiation therapy to these lesions at this time based on significant widespread involvement of multiple myeloma. I talked to medical oncology about her habits salvage systemic treatment which will be considered. Patient also has significant renal function making systemic treatment difficult. Would be happy to reevaluate the patient any time should palliative treatment be indicated.  I would like to take this opportunity to thank you for allowing me to participate in the care of your patient.Armstead Peaks., MD

## 2016-09-21 NOTE — Progress Notes (Signed)
Patient reports pain is not controlled on current medications.

## 2016-09-22 LAB — IGG, IGA, IGM
IGA: 10 mg/dL — AB (ref 87–352)
IGG (IMMUNOGLOBIN G), SERUM: 374 mg/dL — AB (ref 700–1600)
IgM, Serum: 7 mg/dL — ABNORMAL LOW (ref 26–217)

## 2016-09-22 LAB — KAPPA/LAMBDA LIGHT CHAINS
KAPPA FREE LGHT CHN: 9843 mg/L — AB (ref 3.3–19.4)
KAPPA, LAMDA LIGHT CHAIN RATIO: 1697.07 — AB (ref 0.26–1.65)
LAMDA FREE LIGHT CHAINS: 5.8 mg/L (ref 5.7–26.3)

## 2016-09-23 MED ORDER — LENALIDOMIDE 5 MG PO CAPS: 5.0000 mg | ORAL_CAPSULE | Freq: Every day | ORAL | 5 refills | Status: DC

## 2016-09-24 LAB — PROTEIN ELECTROPHORESIS, SERUM
A/G Ratio: 1.2 (ref 0.7–1.7)
Albumin ELP: 3.4 g/dL (ref 2.9–4.4)
Alpha-1-Globulin: 0.3 g/dL (ref 0.0–0.4)
Alpha-2-Globulin: 1.1 g/dL — ABNORMAL HIGH (ref 0.4–1.0)
Beta Globulin: 1.1 g/dL (ref 0.7–1.3)
GAMMA GLOBULIN: 0.3 g/dL — AB (ref 0.4–1.8)
Globulin, Total: 2.9 g/dL (ref 2.2–3.9)
M-SPIKE, %: 0.3 g/dL — AB
TOTAL PROTEIN ELP: 6.3 g/dL (ref 6.0–8.5)

## 2016-09-27 ENCOUNTER — Telehealth: Payer: Self-pay

## 2016-09-27 NOTE — Telephone Encounter (Signed)
Application has been faxed to me, waiting on jacqui to call me back with information from patient

## 2016-09-27 NOTE — Telephone Encounter (Signed)
Pearl from [employer] called and wanted to check with [place] if patient has insurance or if she has medicaid pending. Advised to call back at [phone]. Patient has medicaid pending and I have called pearl back and left message for her to please fax over application for patient to get her medication.

## 2016-09-30 NOTE — Telephone Encounter (Signed)
Faxed over application to [employer]

## 2016-10-01 NOTE — Telephone Encounter (Signed)
Working on [employer] patient assistance. Tried calling patient unable to reach her or leave voicemail. Message sent to jacqui about the process for assistance.

## 2016-10-04 ENCOUNTER — Other Ambulatory Visit: Payer: Self-pay | Admitting: *Deleted

## 2016-10-04 DIAGNOSIS — C9002 Multiple myeloma in relapse: Secondary | ICD-10-CM

## 2016-10-04 MED ORDER — LENALIDOMIDE 5 MG PO CAPS: 5.0000 mg | ORAL_CAPSULE | Freq: Every day | ORAL | 5 refills | Status: DC

## 2016-10-05 ENCOUNTER — Telehealth: Payer: Self-pay | Admitting: *Deleted

## 2016-10-05 ENCOUNTER — Encounter: Payer: Self-pay | Admitting: *Deleted

## 2016-10-05 NOTE — Telephone Encounter (Signed)
Call received requesting [place] # for Revlimid prescription, Auth # [id number]. Authorization # given and per [employer] medication is ready to ship.

## 2016-10-05 NOTE — Telephone Encounter (Signed)
Call received from [place] Patient [employer], patient qualifies for free drug for Revlimid. Patient qualifies through the end of this year. Prescription faxed to [facility].

## 2016-10-05 NOTE — Telephone Encounter (Signed)
Faxed [place] form today

## 2016-10-06 ENCOUNTER — Telehealth: Payer: Self-pay | Admitting: *Deleted

## 2016-10-06 ENCOUNTER — Other Ambulatory Visit: Payer: Self-pay | Admitting: *Deleted

## 2016-10-06 DIAGNOSIS — C9002 Multiple myeloma in relapse: Secondary | ICD-10-CM

## 2016-10-06 MED ORDER — OXYCODONE HCL 10 MG PO TABS: 10.0000 mg | ORAL_TABLET | ORAL | 0 refills | Status: DC | PRN

## 2016-10-06 NOTE — Telephone Encounter (Signed)
Chelsea called from [facility] to let us know they received patient assistance forms, updates will follow.

## 2016-10-06 NOTE — Telephone Encounter (Signed)
Patient contacted interpreter for pain medication refill. Medication refilled and sent to front desk.

## 2016-10-18 NOTE — Telephone Encounter (Signed)
These were the wrong forms have faxed the correct forms Friday but not sure if fax went through

## 2016-10-19 ENCOUNTER — Other Ambulatory Visit: Payer: Self-pay

## 2016-10-19 DIAGNOSIS — C9002 Multiple myeloma in relapse: Secondary | ICD-10-CM

## 2016-10-19 MED ORDER — IXAZOMIB CITRATE 3 MG PO CAPS: 3.0000 mg | ORAL_CAPSULE | ORAL | 3 refills | Status: DC

## 2016-10-20 ENCOUNTER — Inpatient Hospital Stay
Admission: EM | Admit: 2016-10-20 | Discharge: 2016-10-23 | DRG: 840 | Disposition: A | Discharge status: Home / Self Care | Payer: Medicaid Other | Attending: Internal Medicine | Admitting: Internal Medicine

## 2016-10-20 ENCOUNTER — Telehealth: Payer: Self-pay | Admitting: Oncology

## 2016-10-20 ENCOUNTER — Encounter: Payer: Self-pay | Admitting: *Deleted

## 2016-10-20 DIAGNOSIS — Z8249 Family history of ischemic heart disease and other diseases of the circulatory system: Secondary | ICD-10-CM | POA: Diagnosis not present

## 2016-10-20 DIAGNOSIS — N2581 Secondary hyperparathyroidism of renal origin: Secondary | ICD-10-CM | POA: Diagnosis present

## 2016-10-20 DIAGNOSIS — G8929 Other chronic pain: Secondary | ICD-10-CM | POA: Diagnosis present

## 2016-10-20 DIAGNOSIS — M1990 Unspecified osteoarthritis, unspecified site: Secondary | ICD-10-CM | POA: Diagnosis present

## 2016-10-20 DIAGNOSIS — E1122 Type 2 diabetes mellitus with diabetic chronic kidney disease: Secondary | ICD-10-CM | POA: Diagnosis present

## 2016-10-20 DIAGNOSIS — F329 Major depressive disorder, single episode, unspecified: Secondary | ICD-10-CM | POA: Diagnosis present

## 2016-10-20 DIAGNOSIS — G629 Polyneuropathy, unspecified: Secondary | ICD-10-CM | POA: Diagnosis present

## 2016-10-20 DIAGNOSIS — Z992 Dependence on renal dialysis: Secondary | ICD-10-CM | POA: Diagnosis not present

## 2016-10-20 DIAGNOSIS — I120 Hypertensive chronic kidney disease with stage 5 chronic kidney disease or end stage renal disease: Secondary | ICD-10-CM | POA: Diagnosis present

## 2016-10-20 DIAGNOSIS — C9000 Multiple myeloma not having achieved remission: Secondary | ICD-10-CM | POA: Diagnosis present

## 2016-10-20 DIAGNOSIS — D649 Anemia, unspecified: Secondary | ICD-10-CM | POA: Diagnosis present

## 2016-10-20 DIAGNOSIS — Z79899 Other long term (current) drug therapy: Secondary | ICD-10-CM | POA: Diagnosis not present

## 2016-10-20 DIAGNOSIS — Z8349 Family history of other endocrine, nutritional and metabolic diseases: Secondary | ICD-10-CM

## 2016-10-20 DIAGNOSIS — D630 Anemia in neoplastic disease: Secondary | ICD-10-CM | POA: Diagnosis present

## 2016-10-20 DIAGNOSIS — F419 Anxiety disorder, unspecified: Secondary | ICD-10-CM | POA: Diagnosis present

## 2016-10-20 DIAGNOSIS — M25569 Pain in unspecified knee: Secondary | ICD-10-CM

## 2016-10-20 DIAGNOSIS — Z794 Long term (current) use of insulin: Secondary | ICD-10-CM

## 2016-10-20 DIAGNOSIS — D61818 Other pancytopenia: Secondary | ICD-10-CM | POA: Diagnosis present

## 2016-10-20 DIAGNOSIS — D696 Thrombocytopenia, unspecified: Secondary | ICD-10-CM

## 2016-10-20 DIAGNOSIS — R197 Diarrhea, unspecified: Secondary | ICD-10-CM | POA: Diagnosis present

## 2016-10-20 DIAGNOSIS — N186 End stage renal disease: Secondary | ICD-10-CM | POA: Diagnosis present

## 2016-10-20 DIAGNOSIS — I248 Other forms of acute ischemic heart disease: Secondary | ICD-10-CM | POA: Diagnosis present

## 2016-10-20 LAB — CBC
HEMATOCRIT: 16.6 % — AB (ref 35.0–47.0)
HEMOGLOBIN: 5.5 g/dL — AB (ref 12.0–16.0)
MCH: 31 pg (ref 26.0–34.0)
MCHC: 33.2 g/dL (ref 32.0–36.0)
MCV: 93.5 fL (ref 80.0–100.0)
Platelets: 67 10*3/uL — ABNORMAL LOW (ref 150–440)
RBC: 1.78 MIL/uL — AB (ref 3.80–5.20)
RDW: 16.1 % — ABNORMAL HIGH (ref 11.5–14.5)
WBC: 2.5 10*3/uL — AB (ref 3.6–11.0)

## 2016-10-20 LAB — BASIC METABOLIC PANEL
ANION GAP: 12 (ref 5–15)
BUN: 22 mg/dL — ABNORMAL HIGH (ref 6–20)
CHLORIDE: 91 mmol/L — AB (ref 101–111)
CO2: 29 mmol/L (ref 22–32)
Calcium: 9.4 mg/dL (ref 8.9–10.3)
Creatinine, Ser: 2.38 mg/dL — ABNORMAL HIGH (ref 0.44–1.00)
GFR calc Af Amer: 26 mL/min — ABNORMAL LOW (ref >60)
GFR, EST NON AFRICAN AMERICAN: 22 mL/min — AB (ref >60)
Glucose, Bld: 146 mg/dL — ABNORMAL HIGH (ref 65–99)
POTASSIUM: 3.1 mmol/L — AB (ref 3.5–5.1)
SODIUM: 132 mmol/L — AB (ref 135–145)

## 2016-10-20 LAB — TROPONIN I: TROPONIN I: 0.27 ng/mL — AB (ref <0.03)

## 2016-10-20 LAB — PHOSPHORUS: PHOSPHORUS: 3.9 mg/dL (ref 2.5–4.6)

## 2016-10-20 LAB — GLUCOSE, CAPILLARY: GLUCOSE-CAPILLARY: 171 mg/dL — AB (ref 65–99)

## 2016-10-20 LAB — MAGNESIUM: Magnesium: 1.7 mg/dL (ref 1.7–2.4)

## 2016-10-20 MED ORDER — OXYCODONE HCL 5 MG PO TABS: 10.0000 mg | ORAL_TABLET | ORAL | Status: DC | PRN

## 2016-10-20 MED ORDER — INSULIN ASPART 100 UNIT/ML ~~LOC~~ SOLN
0.0000 [IU] | Freq: Three times a day (TID) | SUBCUTANEOUS | Status: DC
  Administered 2016-10-21 – 2016-10-22 (×2): 2 [IU] via SUBCUTANEOUS
  Filled 2016-10-20 (×2): qty 1

## 2016-10-20 MED ORDER — SODIUM CHLORIDE 0.9 % IV SOLN
INTRAVENOUS | Status: DC
  Administered 2016-10-21: 01:00:00 via INTRAVENOUS

## 2016-10-20 MED ORDER — ACETAMINOPHEN 650 MG RE SUPP: 650.0000 mg | Freq: Four times a day (QID) | RECTAL | Status: DC | PRN

## 2016-10-20 MED ORDER — ACETAMINOPHEN 325 MG PO TABS
650.0000 mg | ORAL_TABLET | Freq: Four times a day (QID) | ORAL | Status: DC | PRN
Start: 2016-10-20 — End: 2016-10-23

## 2016-10-20 MED ORDER — INSULIN DETEMIR 100 UNIT/ML ~~LOC~~ SOLN
5.0000 [IU] | Freq: Every day | SUBCUTANEOUS | Status: DC
  Administered 2016-10-21 – 2016-10-22 (×3): 5 [IU] via SUBCUTANEOUS
  Filled 2016-10-20 (×4): qty 0.05

## 2016-10-20 MED ORDER — PROCHLORPERAZINE MALEATE 10 MG PO TABS
10.0000 mg | ORAL_TABLET | Freq: Four times a day (QID) | ORAL | Status: DC | PRN
  Administered 2016-10-22: 10:00:00 10 mg via ORAL
  Filled 2016-10-20 (×2): qty 1

## 2016-10-20 MED ORDER — HEPARIN SODIUM (PORCINE) 5000 UNIT/ML IJ SOLN: 5000.0000 [IU] | Freq: Three times a day (TID) | INTRAMUSCULAR | Status: DC

## 2016-10-20 MED ORDER — FENTANYL 25 MCG/HR TD PT72
25.0000 ug | MEDICATED_PATCH | TRANSDERMAL | Status: DC
  Administered 2016-10-21: 25 ug via TRANSDERMAL
  Filled 2016-10-20: qty 1

## 2016-10-20 MED ORDER — SODIUM CHLORIDE 0.9 % IV SOLN: Freq: Once | INTRAVENOUS | Status: DC

## 2016-10-20 MED ORDER — IPRATROPIUM BROMIDE 0.02 % IN SOLN: 0.5000 mg | Freq: Four times a day (QID) | RESPIRATORY_TRACT | Status: DC | PRN

## 2016-10-20 MED ORDER — ONDANSETRON HCL 4 MG PO TABS
4.0000 mg | ORAL_TABLET | Freq: Three times a day (TID) | ORAL | Status: DC | PRN
  Administered 2016-10-22: 4 mg via ORAL
  Filled 2016-10-20: qty 1

## 2016-10-20 MED ORDER — LENALIDOMIDE 5 MG PO CAPS
5.0000 mg | ORAL_CAPSULE | Freq: Every day | ORAL | Status: DC
  Administered 2016-10-21: 09:00:00 5 mg via ORAL
  Filled 2016-10-20: qty 1

## 2016-10-20 MED ORDER — INSULIN ASPART 100 UNIT/ML ~~LOC~~ SOLN: 0.0000 [IU] | Freq: Every day | SUBCUTANEOUS | Status: DC

## 2016-10-20 MED ORDER — ALBUTEROL SULFATE (2.5 MG/3ML) 0.083% IN NEBU: 2.5000 mg | INHALATION_SOLUTION | Freq: Four times a day (QID) | RESPIRATORY_TRACT | Status: DC | PRN

## 2016-10-20 MED ORDER — SENNA 8.6 MG PO TABS
1.0000 | ORAL_TABLET | Freq: Every day | ORAL | Status: DC
  Administered 2016-10-21 – 2016-10-22 (×2): 8.6 mg via ORAL
  Filled 2016-10-20 (×2): qty 1

## 2016-10-20 MED ORDER — VERAPAMIL HCL ER 120 MG PO TBCR
120.0000 mg | EXTENDED_RELEASE_TABLET | Freq: Every day | ORAL | Status: DC
  Administered 2016-10-21 – 2016-10-22 (×2): 120 mg via ORAL
  Filled 2016-10-20 (×3): qty 1

## 2016-10-20 NOTE — H&P (Signed)
History and Physical   [facility] - [place] @ [facility] Admission History and Physical [employer], D.O.    Patient Name: Jamie Keller MR#: [id number] Date of Birth: 1965/07/15 Date of Admission: 10/20/2016  Referring MD/NP/PA: Dr. Clearnce Hasten Primary Care Physician: Alene Mires [name], MD  Chief Complaint:  Chief Complaint  Patient presents with  . Anemia    HPI: Jamie Keller is a 51 y.o. female with a known history of MM, ESRD on HD (MWF), chronic anemia, DM, anxiety/depression presents to the emergency department for evaluation of anemia discovered on routine labs, today was called with HgB of 5.5.  Patient was in a usual state of health until eight days ago when she started chemo, developed generalized weakness and one episode of diarrhea per day.  Patient denies fevers/chills, weakness, dizziness, chest pain, shortness of breath, N/V/C/D, abdominal pain, dysuria/frequency, changes in mental status.    There has been no other change in status. Patient has been taking medication as prescribed and there has been no recent change in medication or diet.  No recent antibiotics.  There has been no recent illness, hospitalizations, travel or sick contacts.    Medical admission has been requested for further management of symptomatic anemia.  Review of Systems:  CONSTITUTIONAL: Positive fatigue, generalized weakness. No fever/chills, weight gain/loss, headache. EYES: No blurry or double vision. ENT: No tinnitus, postnasal drip, redness or soreness of the oropharynx. RESPIRATORY: No cough, dyspnea, wheeze.  No hemoptysis.  CARDIOVASCULAR: No chest pain, palpitations, syncope, orthopnea. No lower extremity edema.  GASTROINTESTINAL: No nausea, vomiting, abdominal pain, diarrhea, constipation.  No hematemesis, melena or hematochezia. GENITOURINARY: No dysuria, frequency, hematuria. ENDOCRINE: No polyuria or nocturia. No heat or cold intolerance. HEMATOLOGY:  No anemia, bruising, bleeding. INTEGUMENTARY: No rashes, ulcers, lesions. MUSCULOSKELETAL: No arthritis, gout, dyspnea. NEUROLOGIC: No numbness, tingling, ataxia, seizure-type activity, weakness. PSYCHIATRIC: No anxiety, depression, insomnia.   Past Medical History:  Diagnosis Date  . Anemia   . Anxiety   . Chronic kidney disease    DIALYSIS  . Depression   . Diabetes mellitus without complication ([place])   . Dialysis patient ([place])    Mon, Wed, Fri  . History of chemotherapy    chemo on Tuesday and Wednesday  . Hypertension   . Multiple myeloma ([place])   . Neuropathy associated with cancer ([place])   . Osteoarthritis   . Pancreatitis   . Pneumonia    September 2017, [facility]  . Post-menopausal 2014  . Sepsis [facility])     Past Surgical History:  Procedure Laterality Date  . A/V SHUNT INTERVENTION Left 07/07/2016   Procedure: A/V Shunt Intervention;  Surgeon: Algernon Huxley, MD;  Location: [place] CV LAB;  Service: Cardiovascular;  Laterality: Left;  . AV FISTULA PLACEMENT Left 02/20/2016   Procedure: INSERTION OF ARTERIOVENOUS (AV) GORE-TEX GRAFT ARM;  Surgeon: Katha Cabal, MD;  Location: [facility] ORS;  Service: Vascular;  Laterality: Left;  . CESAREAN SECTION     x2  . dialysis catheter placement    . PERIPHERAL VASCULAR CATHETERIZATION N/A 11/19/2015   Procedure: Dialysis/Perma Catheter Insertion;  Surgeon: Algernon Huxley, MD;  Location: [place] CV LAB;  Service: Cardiovascular;  Laterality: N/A;  . PERIPHERAL VASCULAR CATHETERIZATION Left 03/10/2016   Procedure: Thrombectomy;  Surgeon: Katha Cabal, MD;  Location: [place] CV LAB;  Service: Cardiovascular;  Laterality: Left;  . PERIPHERAL VASCULAR CATHETERIZATION N/A 03/31/2016   Procedure: Dialysis/Perma Catheter Removal;  Surgeon: Katha Cabal, MD;  Location: [place]  CV LAB;  Service: Cardiovascular;  Laterality: N/A;  . PORTACATH PLACEMENT    . TUBAL LIGATION       reports that she has never smoked. She  has never used smokeless tobacco. She reports that she does not drink alcohol or use drugs.  No Known Allergies  Family History  Problem Relation Age of Onset  . Hypercholesterolemia Mother   . Hypertension Mother   . Hypertension Father     Prior to Admission medications   Medication Sig Start Date End Date Taking? Authorizing Provider  fentaNYL (DURAGESIC - DOSED MCG/HR) 25 MCG/HR patch Place 1 patch (25 mcg total) onto the skin every 3 (three) days. 09/21/16   Lloyd Huger, MD  Insulin Detemir (LEVEMIR FLEXPEN) 100 UNIT/ML Pen Inject 5 Units into the skin daily at 10 pm.    [provider]  Insulin Pen Needle (NOVOFINE) 30G X 8 MM MISC Inject 10 each into the skin as needed. 04/02/16   Hillary Bow, MD  ixazomib citrate (NINLARO) 3 MG capsule Take 1 capsule (3 mg total) by mouth once a week. For 3 weeks then hold on week 4.  Take on an empty stomach 1hr before or 2hrs after food. Do not crush, chew, or open. 10/19/16   Lloyd Huger, MD  lenalidomide (REVLIMID) 5 MG capsule Take 1 capsule (5 mg total) by mouth daily. 10/04/16   Jacquelin Hawking, NP  ondansetron (ZOFRAN) 8 MG tablet Take by mouth every 8 (eight) hours as needed for nausea or vomiting.    [provider]  Oxycodone HCl 10 MG TABS Take 1 tablet (10 mg total) by mouth every 4 (four) hours as needed (take 1 tablet by mouth every 4 - 6 hours as needed for severe pain). 10/06/16   Cammie Sickle, MD  predniSONE (STERAPRED UNI-PAK 21 TAB) 5 MG (21) TBPK tablet Taper as directed Patient not taking: Reported on 09/21/2016 09/07/16   Lloyd Huger, MD  prochlorperazine (COMPAZINE) 10 MG tablet Take 1 tablet (10 mg total) by mouth every 6 (six) hours as needed for nausea or vomiting. 09/07/16   Lloyd Huger, MD  senna (SENOKOT) 8.6 MG tablet Take 1 tablet by mouth daily.    [provider]  verapamil (CALAN-SR) 120 MG CR tablet Take 120 mg by mouth daily.    [provider]     Physical Exam: Vitals:   10/20/16 2134 10/20/16 2135 10/20/16 2136 10/20/16 2137  BP:      Pulse: (!) 118 (!) 118 (!) 118 (!) 118  Resp: 16 20 (!) 23 19  Temp:      TempSrc:      SpO2: 96% 93% 93% 92%  Weight:      Height:        GENERAL: 51 y.o.-year-old female patient, pale with generalized weakness lying in the bed in no acute distress.  Pleasant and cooperative.   HEENT: Head atraumatic, normocephalic. Pupils equal. Mucus membranes moist. NECK: Supple, full range of motion. No JVD, no bruit heard. No thyroid enlargement, no tenderness, no cervical lymphadenopathy. CHEST: Normal breath sounds bilaterally. No wheezing, rales, rhonchi or crackles. No use of accessory muscles of respiration.  No reproducible chest wall tenderness.  CARDIOVASCULAR: S1, S2 normal. No murmurs, rubs, or gallops. Cap refill <2 seconds. Pulses intact distally.  ABDOMEN: Soft, nondistended, nontender. No rebound, guarding, rigidity. Normoactive bowel sounds present in all four quadrants.  EXTREMITIES: No pedal edema, cyanosis, or clubbing. No calf tenderness or  Homan's sign. LUE fistula with thrill.  NEUROLOGIC: The patient is alert and oriented x 3. Cranial nerves II through XII are grossly intact with no focal sensorimotor deficit. PSYCHIATRIC:  Normal affect, mood, thought content. SKIN: Warm, dry, and intact without obvious rash, lesion, or ulcer.    Labs on Admission:  CBC:  Recent Labs Lab 10/20/16 2010  WBC 2.5*  HGB 5.5*  HCT 16.6*  MCV 93.5  PLT 67*   Basic Metabolic Panel:  Recent Labs Lab 10/20/16 2010  NA 132*  K 3.1*  CL 91*  CO2 29  GLUCOSE 146*  BUN 22*  CREATININE 2.38*  CALCIUM 9.4   GFR: Estimated Creatinine Clearance: 19.9 mL/min (A) (by C-G formula based on SCr of 2.38 mg/dL (H)). Liver Function Tests: No results for input(s): AST, ALT, ALKPHOS, BILITOT, PROT, ALBUMIN in the last 168 hours. No results for input(s): LIPASE, AMYLASE in the last 168 hours. No  results for input(s): AMMONIA in the last 168 hours. Coagulation Profile: No results for input(s): INR, PROTIME in the last 168 hours. Cardiac Enzymes:  Recent Labs Lab 10/20/16 2010  TROPONINI 0.27*   BNP (last 3 results) No results for input(s): PROBNP in the last 8760 hours. HbA1C: No results for input(s): HGBA1C in the last 72 hours. CBG: No results for input(s): GLUCAP in the last 168 hours. Lipid Profile: No results for input(s): CHOL, HDL, LDLCALC, TRIG, CHOLHDL, LDLDIRECT in the last 72 hours. Thyroid Function Tests: No results for input(s): TSH, T4TOTAL, FREET4, T3FREE, THYROIDAB in the last 72 hours. Anemia Panel: No results for input(s): VITAMINB12, FOLATE, FERRITIN, TIBC, IRON, RETICCTPCT in the last 72 hours. Urine analysis:    Component Value Date/Time   COLORURINE YELLOW (A) 05/25/2016 1531   APPEARANCEUR CLEAR (A) 05/25/2016 1531   APPEARANCEUR Clear 08/06/2013 2345   LABSPEC 1.009 05/25/2016 1531   LABSPEC 1.010 08/06/2013 2345   PHURINE 9.0 (H) 05/25/2016 1531   GLUCOSEU 150 (A) 05/25/2016 1531   GLUCOSEU 50 mg/dL 08/06/2013 2345   HGBUR NEGATIVE 05/25/2016 1531   BILIRUBINUR NEGATIVE 05/25/2016 1531   BILIRUBINUR Negative 08/06/2013 2345   KETONESUR NEGATIVE 05/25/2016 1531   PROTEINUR 100 (A) 05/25/2016 1531   NITRITE NEGATIVE 05/25/2016 1531   LEUKOCYTESUR NEGATIVE 05/25/2016 1531   LEUKOCYTESUR Negative 08/06/2013 2345   Sepsis Labs: @LABRCNTIP (procalcitonin:4,lacticidven:4) )No results found for this or any previous visit (from the past 240 hour(s)).   Radiological Exams on Admission: No results found.  EKG: Sinus tach at 118 bpm with normal axis and nonspecific ST-T wave changes.   Assessment/Plan  This is a 51 y.o. female with a history of MM, ESRD on HD (MWF), chronic anemia, DM, anxiety/depression now being admitted with:  #. Symptomatic anemia of chronic disease - Admit inpatient, telemetry monitoring - Transfuse and follow up  CBC - Check iron studies, B12, folate, FOBT - Cross matched blood being sent from [place].  Will order two more units.   #. ESRD on HD - Renal consult requested for continued HD  #. Elevated troponin, likely 2/2 ESRD, demand ischemia.  No chest pain, EKG nonischemic - Trend trops - Monitor on tele  #. Diarrhea, likely 2/2 chemotherapy - Continue loperamide  #. History of MM - Continue Revlimid  #. History of Diabetes - Accuchecks achs with RISS coverage - Heart healthy, carb controlled diet  Admission status: Inpatient, tele IV Fluids: HL Diet/Nutrition: HH, CC Consults called: Renal  DVT Px: SCDs and early ambulation. Code Status: Full Code  Disposition Plan:  To home in 1-2 days  All the records are reviewed and case discussed with ED provider. Management plans discussed with the patient and/or family who express understanding and agree with plan of care.  Amee Boothe D.O. on 10/20/2016 at 9:41 PM Between 7am to 6pm - Pager - [phone] After 6pm go to [email or web address] - [employer] [facility] [place] [facility] Office [phone] CC: Primary care physician; Theotis Burrow, MD   10/20/2016, 9:41 PM

## 2016-10-20 NOTE — Telephone Encounter (Signed)
Tried to contact [name] we need her husbands tax forms from last year in order to get her meds thru Patient Assistance program. Was not able to leave a message.  Will continue to get in touch with.    [place] Patient Advocate 10/20/2016 2:48 PM

## 2016-10-20 NOTE — ED Notes (Signed)
Lab called to inform this RN that the cancer medicine the patient is on, makes the antibodies test positive, so if she is going to receive blood, the blood bank will have to send out for a phenotype match blood and that will cause delay in delivery of the blood. MD notified.

## 2016-10-20 NOTE — Progress Notes (Signed)
Made Dr. Ara Kussmaul aware that per blood bank patient's blood is coming from [place] and  will not be here until tomorrow afternoon. Clarise Cruz, RN

## 2016-10-20 NOTE — ED Provider Notes (Signed)
[facility] Emergency Department Provider Note  ____________________________________________   First MD Initiated Contact with Patient 10/20/16 2108     (approximate)  I have reviewed the triage vital signs and the nursing notes.   HISTORY  Chief Complaint Anemia   HPI Jamie Keller is a 51 y.o. female with a history of end-stage renal disease on dialysis as well as multiple myeloma was presented to the emergency department after being called after an outpatient blood draw for hemoglobin of 5.5. The patient states that she has bilateral aching knee pain which is chronic but otherwise Does not complain of any pain. However, she does complain of diffuse weakness. She says that she started chemotherapy 8 days ago and in the past has needed blood transfusions with her chemotherapy. Says that she has had that one episode of diarrhea per day as well since having a chemotherapy which has been yellow. She denies any blood in her stool or any other bleeding foci. She says that she was just dialyzed today. Says that she also makes urine.   Past Medical History:  Diagnosis Date  . Anemia   . Anxiety   . Chronic kidney disease    DIALYSIS  . Depression   . Diabetes mellitus without complication ([place])   . Dialysis patient ([place])    Mon, Wed, Fri  . History of chemotherapy    chemo on Tuesday and Wednesday  . Hypertension   . Multiple myeloma ([place])   . Neuropathy associated with cancer ([place])   . Osteoarthritis   . Pancreatitis   . Pneumonia    September 2017, [facility]  . Post-menopausal 2014  . Sepsis [facility])     Patient Active Problem List   Diagnosis Date Noted  . Hypocalcemia   . Acute on chronic renal failure ([place]) 06/20/2016  . Abnormal EKG 06/20/2016  . Vomiting 06/20/2016  . Respiratory failure ([place]) 05/25/2016  . Respiratory distress   . Multiple myeloma in relapse ([place]) 05/01/2016  . Influenza B 04/02/2016  . Pulmonary edema 04/01/2016  .  Hyperkalemia 04/01/2016  . Epigastric pain   . Atypical pneumonia 12/26/2015  . Immunocompromised ([place]) 12/26/2015  . Sepsis ([place]) 12/01/2015  . HCAP (healthcare-associated pneumonia) 12/01/2015  . End stage renal disease ([place]) 12/01/2015  . HTN (hypertension) 12/01/2015  . Depression 12/01/2015  . Acute respiratory failure ([place]) 11/14/2015  . Acute renal failure ([place])   . Post-menopausal   . Bulging lumbar disc 05/28/2014    Past Surgical History:  Procedure Laterality Date  . A/V SHUNT INTERVENTION Left 07/07/2016   Procedure: A/V Shunt Intervention;  Surgeon: Algernon Huxley, MD;  Location: [place] CV LAB;  Service: Cardiovascular;  Laterality: Left;  . AV FISTULA PLACEMENT Left 02/20/2016   Procedure: INSERTION OF ARTERIOVENOUS (AV) GORE-TEX GRAFT ARM;  Surgeon: Katha Cabal, MD;  Location: [facility] ORS;  Service: Vascular;  Laterality: Left;  . CESAREAN SECTION     x2  . dialysis catheter placement    . PERIPHERAL VASCULAR CATHETERIZATION N/A 11/19/2015   Procedure: Dialysis/Perma Catheter Insertion;  Surgeon: Algernon Huxley, MD;  Location: [place] CV LAB;  Service: Cardiovascular;  Laterality: N/A;  . PERIPHERAL VASCULAR CATHETERIZATION Left 03/10/2016   Procedure: Thrombectomy;  Surgeon: Katha Cabal, MD;  Location: [place] [facility] LAB;  Service: Cardiovascular;  Laterality: Left;  . PERIPHERAL VASCULAR CATHETERIZATION N/A 03/31/2016   Procedure: Dialysis/Perma Catheter Removal;  Surgeon: Katha Cabal, MD;  Location: [place] CV LAB;  Service:  Cardiovascular;  Laterality: N/A;  . PORTACATH PLACEMENT    . TUBAL LIGATION      Prior to Admission medications   Medication Sig Start Date End Date Taking? Authorizing Provider  fentaNYL (DURAGESIC - DOSED MCG/HR) 25 MCG/HR patch Place 1 patch (25 mcg total) onto the skin every 3 (three) days. 09/21/16   Lloyd Huger, MD  Insulin Detemir (LEVEMIR FLEXPEN) 100 UNIT/ML Pen Inject 5 Units into the skin daily at  10 pm.    [provider]  Insulin Pen Needle (NOVOFINE) 30G X 8 MM MISC Inject 10 each into the skin as needed. 04/02/16   Hillary Bow, MD  ixazomib citrate (NINLARO) 3 MG capsule Take 1 capsule (3 mg total) by mouth once a week. For 3 weeks then hold on week 4.  Take on an empty stomach 1hr before or 2hrs after food. Do not crush, chew, or open. 10/19/16   Lloyd Huger, MD  lenalidomide (REVLIMID) 5 MG capsule Take 1 capsule (5 mg total) by mouth daily. 10/04/16   Jacquelin Hawking, NP  ondansetron (ZOFRAN) 8 MG tablet Take by mouth every 8 (eight) hours as needed for nausea or vomiting.    [provider]  Oxycodone HCl 10 MG TABS Take 1 tablet (10 mg total) by mouth every 4 (four) hours as needed (take 1 tablet by mouth every 4 - 6 hours as needed for severe pain). 10/06/16   Cammie Sickle, MD  predniSONE (STERAPRED UNI-PAK 21 TAB) 5 MG (21) TBPK tablet Taper as directed Patient not taking: Reported on 09/21/2016 09/07/16   Lloyd Huger, MD  prochlorperazine (COMPAZINE) 10 MG tablet Take 1 tablet (10 mg total) by mouth every 6 (six) hours as needed for nausea or vomiting. 09/07/16   Lloyd Huger, MD  senna (SENOKOT) 8.6 MG tablet Take 1 tablet by mouth daily.    [provider]  verapamil (CALAN-SR) 120 MG CR tablet Take 120 mg by mouth daily.    [provider]    Allergies Patient has no known allergies.  Family History  Problem Relation Age of Onset  . Hypercholesterolemia Mother   . Hypertension Mother   . Hypertension Father     Social History Social History  Substance Use Topics  . Smoking status: Never Smoker  . Smokeless tobacco: Never Used  . Alcohol use No    Review of Systems  Constitutional: No fever/chills Eyes: No visual changes. ENT: No sore throat. Cardiovascular: Denies chest pain. Respiratory: Denies shortness of breath. Gastrointestinal: No abdominal pain.  No nausea, no vomiting.  No diarrhea.  No  constipation. Genitourinary: Negative for dysuria. Musculoskeletal: Negative for back pain. Skin: Negative for rash. Neurological: Negative for headaches, focal weakness or numbness.   ____________________________________________   PHYSICAL EXAM:  VITAL SIGNS: ED Triage Vitals  Enc Vitals Group     BP 10/20/16 1958 (!) 163/51     Pulse Rate 10/20/16 1958 (!) 122     Resp 10/20/16 1958 20     Temp 10/20/16 1958 100.3 F (37.9 C)     Temp Source 10/20/16 1958 Oral     SpO2 10/20/16 1958 95 %     Weight 10/20/16 2000 104 lb (47.2 kg)     Height 10/20/16 2000 5' (1.524 m)     Head Circumference --      Peak Flow --      Pain Score 10/20/16 1958 7     Pain Loc --  Pain Edu? --      Excl. in [place]? --     Constitutional: Alert and oriented. Well appearing and in no acute distress. Eyes: Conjunctivae are Pale. Head: Atraumatic. Nose: No congestion/rhinnorhea. Mouth/Throat: Mucous membranes are moist.  Neck: No stridor.   Cardiovascular: Tachycardic, regular rhythm. Grossly normal heart sounds.  Good peripheral circulation with palpable thrill to the left upper extremity fistula. Respiratory: Normal respiratory effort.  No retractions. Lungs CTAB. Gastrointestinal: Soft and nontender. No distention.  Musculoskeletal: No lower extremity tenderness nor edema.  No joint effusions. Neurologic:  Normal speech and language. No gross focal neurologic deficits are appreciated. Skin:  Skin is warm, dry and intact. No rash noted. Psychiatric: Mood and affect are normal. Speech and behavior are normal.  ____________________________________________   LABS (all labs ordered are listed, but only abnormal results are displayed)  Labs Reviewed  BASIC METABOLIC PANEL - Abnormal; Notable for the following:       Result Value   Sodium 132 (*)    Potassium 3.1 (*)    Chloride 91 (*)    Glucose, Bld 146 (*)    BUN 22 (*)    Creatinine, Ser 2.38 (*)    GFR calc non Af Amer 22 (*)     GFR calc Af Amer 26 (*)    All other components within normal limits  CBC - Abnormal; Notable for the following:    WBC 2.5 (*)    RBC 1.78 (*)    Hemoglobin 5.5 (*)    HCT 16.6 (*)    RDW 16.1 (*)    Platelets 67 (*)    All other components within normal limits  TROPONIN I - Abnormal; Notable for the following:    Troponin I 0.27 (*)    All other components within normal limits  URINALYSIS, COMPLETE (UACMP) WITH MICROSCOPIC  CBG MONITORING, ED  TYPE AND SCREEN   ____________________________________________  EKG  ED ECG REPORT I, Doran Stabler, the attending physician, personally viewed and interpreted this ECG.   Date: 10/20/2016  EKG Time: 2006  Rate: 118  Rhythm: sinus tachycardia  Axis: Normal  Intervals:none  ST&T Change: No ST segment elevation or depression. No abnormal T-wave inversion.  ____________________________________________  RADIOLOGY   ____________________________________________   PROCEDURES  Procedure(s) performed: None  Procedures  Critical Care performed: Yes, see critical care note(s) CRITICAL CARE Performed by: Doran Stabler   Total critical care time: 35 minutes  Critical care time was exclusive of separately billable procedures and treating other patients.  Critical care was necessary to treat or prevent imminent or life-threatening deterioration.  Critical care was time spent personally by me on the following activities: development of treatment plan with patient and/or surrogate as well as nursing, discussions with consultants, evaluation of patient's response to treatment, examination of patient, obtaining history from patient or surrogate, ordering and performing treatments and interventions, ordering and review of laboratory studies, ordering and review of radiographic studies, pulse oximetry and re-evaluation of patient's condition.  ____________________________________________   INITIAL IMPRESSION / ASSESSMENT  AND PLAN / ED COURSE  Pertinent labs & imaging results that were available during my care of the patient were reviewed by me and considered in my medical decision making (see chart for details).  ----------------------------------------- 9:24 PM on 10/20/2016 -----------------------------------------  Patient likely with side effects of her chemotherapy including diarrhea as well as critical anemia. Patient to be admitted to the hospital. Signed out to Dr. Ara Kussmaul.  Patient is understanding  of the plan and wanted to comply. Had the interpreter present for this interaction,Jacqui.  Patient also with from cytopenia. However, does not appear to need platelet transfusion at this time.      ____________________________________________   FINAL CLINICAL IMPRESSION(S) / ED DIAGNOSES  Anemia. Thrombocytopenia. Diarrhea.    NEW MEDICATIONS STARTED DURING THIS VISIT:  New Prescriptions   No medications on file     Note:  This document was prepared using Dragon voice recognition software and may include unintentional dictation errors.     Orbie Pyo, MD 10/20/16 2125

## 2016-10-20 NOTE — ED Triage Notes (Signed)
Pt to ED reporting dialysis called her and told her her hemoglobin levels were low. Pt is pale upon assessment. Pt reports she has been having NVD intermittently and reports having fevers for the past couple days. Pt is also currently undergoing chemo treatments for multiple myeloma. Pt denies having noticed blood in stool or urine. Pt is alert and oriented x 4.

## 2016-10-21 DIAGNOSIS — I129 Hypertensive chronic kidney disease with stage 1 through stage 4 chronic kidney disease, or unspecified chronic kidney disease: Secondary | ICD-10-CM

## 2016-10-21 DIAGNOSIS — R630 Anorexia: Secondary | ICD-10-CM

## 2016-10-21 DIAGNOSIS — N189 Chronic kidney disease, unspecified: Secondary | ICD-10-CM

## 2016-10-21 DIAGNOSIS — Z992 Dependence on renal dialysis: Secondary | ICD-10-CM

## 2016-10-21 DIAGNOSIS — D649 Anemia, unspecified: Secondary | ICD-10-CM

## 2016-10-21 DIAGNOSIS — M1990 Unspecified osteoarthritis, unspecified site: Secondary | ICD-10-CM

## 2016-10-21 DIAGNOSIS — Z9221 Personal history of antineoplastic chemotherapy: Secondary | ICD-10-CM

## 2016-10-21 DIAGNOSIS — Z79899 Other long term (current) drug therapy: Secondary | ICD-10-CM

## 2016-10-21 DIAGNOSIS — M549 Dorsalgia, unspecified: Secondary | ICD-10-CM

## 2016-10-21 DIAGNOSIS — E119 Type 2 diabetes mellitus without complications: Secondary | ICD-10-CM

## 2016-10-21 DIAGNOSIS — G629 Polyneuropathy, unspecified: Secondary | ICD-10-CM

## 2016-10-21 DIAGNOSIS — Z8719 Personal history of other diseases of the digestive system: Secondary | ICD-10-CM

## 2016-10-21 DIAGNOSIS — D696 Thrombocytopenia, unspecified: Secondary | ICD-10-CM

## 2016-10-21 DIAGNOSIS — F329 Major depressive disorder, single episode, unspecified: Secondary | ICD-10-CM

## 2016-10-21 DIAGNOSIS — Z8701 Personal history of pneumonia (recurrent): Secondary | ICD-10-CM

## 2016-10-21 DIAGNOSIS — R197 Diarrhea, unspecified: Secondary | ICD-10-CM

## 2016-10-21 DIAGNOSIS — R5383 Other fatigue: Secondary | ICD-10-CM

## 2016-10-21 DIAGNOSIS — C9000 Multiple myeloma not having achieved remission: Principal | ICD-10-CM

## 2016-10-21 LAB — IRON AND TIBC
IRON: 49 ug/dL (ref 28–170)
Saturation Ratios: 37 % — ABNORMAL HIGH (ref 10.4–31.8)
TIBC: 133 ug/dL — ABNORMAL LOW (ref 250–450)
UIBC: 84 ug/dL

## 2016-10-21 LAB — BASIC METABOLIC PANEL
Anion gap: 10 (ref 5–15)
BUN: 34 mg/dL — AB (ref 6–20)
CHLORIDE: 96 mmol/L — AB (ref 101–111)
CO2: 30 mmol/L (ref 22–32)
Calcium: 9 mg/dL (ref 8.9–10.3)
Creatinine, Ser: 3.45 mg/dL — ABNORMAL HIGH (ref 0.44–1.00)
GFR calc Af Amer: 16 mL/min — ABNORMAL LOW (ref >60)
GFR, EST NON AFRICAN AMERICAN: 14 mL/min — AB (ref >60)
GLUCOSE: 92 mg/dL (ref 65–99)
POTASSIUM: 3.3 mmol/L — AB (ref 3.5–5.1)
Sodium: 136 mmol/L (ref 135–145)

## 2016-10-21 LAB — URINALYSIS, COMPLETE (UACMP) WITH MICROSCOPIC
Bilirubin Urine: NEGATIVE
Glucose, UA: 150 mg/dL — AB
KETONES UR: NEGATIVE mg/dL
Nitrite: NEGATIVE
PH: 8 (ref 5.0–8.0)
Protein, ur: 30 mg/dL — AB
SPECIFIC GRAVITY, URINE: 1.004 — AB (ref 1.005–1.030)

## 2016-10-21 LAB — CBC
HEMATOCRIT: 14.4 % — AB (ref 35.0–47.0)
Hemoglobin: 4.8 g/dL — CL (ref 12.0–16.0)
MCH: 30.5 pg (ref 26.0–34.0)
MCHC: 33.4 g/dL (ref 32.0–36.0)
MCV: 91.2 fL (ref 80.0–100.0)
Platelets: 52 10*3/uL — ABNORMAL LOW (ref 150–440)
RBC: 1.57 MIL/uL — ABNORMAL LOW (ref 3.80–5.20)
RDW: 15.8 % — AB (ref 11.5–14.5)
WBC: 2.5 10*3/uL — ABNORMAL LOW (ref 3.6–11.0)

## 2016-10-21 LAB — TROPONIN I
TROPONIN I: 0.29 ng/mL — AB (ref <0.03)
Troponin I: 0.23 ng/mL (ref <0.03)
Troponin I: 0.23 ng/mL (ref <0.03)

## 2016-10-21 LAB — HEMOGLOBIN AND HEMATOCRIT, BLOOD
HEMATOCRIT: 17.6 % — AB (ref 35.0–47.0)
HEMATOCRIT: 18.6 % — AB (ref 35.0–47.0)
Hemoglobin: 5.9 g/dL — ABNORMAL LOW (ref 12.0–16.0)
Hemoglobin: 6.3 g/dL — ABNORMAL LOW (ref 12.0–16.0)

## 2016-10-21 LAB — PREPARE RBC (CROSSMATCH)

## 2016-10-21 LAB — FERRITIN: Ferritin: 1347 ng/mL — ABNORMAL HIGH (ref 11–307)

## 2016-10-21 LAB — GLUCOSE, CAPILLARY
GLUCOSE-CAPILLARY: 144 mg/dL — AB (ref 65–99)
Glucose-Capillary: 90 mg/dL (ref 65–99)
Glucose-Capillary: 170 mg/dL — ABNORMAL HIGH (ref 65–99)
Glucose-Capillary: 107 mg/dL — ABNORMAL HIGH (ref 65–99)
Glucose-Capillary: 190 mg/dL — ABNORMAL HIGH (ref 65–99)

## 2016-10-21 LAB — FOLATE: FOLATE: 11.4 ng/mL (ref >5.9)

## 2016-10-21 LAB — VITAMIN B12: Vitamin B-12: 1099 pg/mL — ABNORMAL HIGH (ref 180–914)

## 2016-10-21 LAB — MRSA PCR SCREENING: MRSA by PCR: NEGATIVE

## 2016-10-21 MED ORDER — OXYCODONE HCL 5 MG PO TABS: 10.0000 mg | ORAL_TABLET | ORAL | Status: DC | PRN

## 2016-10-21 MED ORDER — FERRIC CITRATE 1 GM 210 MG(FE) PO TABS
420.0000 mg | ORAL_TABLET | Freq: Three times a day (TID) | ORAL | Status: DC
  Administered 2016-10-21 – 2016-10-23 (×5): 420 mg via ORAL
  Filled 2016-10-21 (×8): qty 2

## 2016-10-21 NOTE — Consult Note (Signed)
[place]  [phone] [phone]  ID: Jamie Keller OB: 04-May-1965  MR#: [id number]  SHF#:[id number]  [name], MD as PCP - General (Family Medicine)  CHIEF COMPLAINT: Progressive multiple myeloma, severe anemia and thrombocytopenia.  INTERVAL HISTORY: Patient is a 51 year old female with progressive multiple myeloma who presented to the emergency room with significant weakness and fatigue. She is found to have a hemoglobin of less than 5.0. She feels improved since admission and receiving blood transfusion, but is not back to her baseline. She had one episode of diarrhea yesterday. She has no neurologic complaints. She denies any fevers. She has a poor appetite, but her weight is stable. She denies any chest pain, shortness of breath, cough, or hemoptysis. She denies any nausea, vomiting, or constipation. She has no urinary complaints. Patient offers no further specific complaints today.  REVIEW OF SYSTEMS:   Review of Systems  Constitutional: Positive for malaise/fatigue and weight loss. Negative for fever.  Respiratory: Negative.  Negative for cough and shortness of breath.   Cardiovascular: Negative.  Negative for chest pain and leg swelling.  Gastrointestinal: Negative.  Negative for abdominal pain, nausea and vomiting.  Genitourinary: Negative.  Negative for hematuria.  Musculoskeletal: Positive for back pain.  Skin: Negative.  Negative for rash.  Neurological: Positive for weakness. Negative for sensory change.  Psychiatric/Behavioral: Positive for depression. The patient is not nervous/anxious.     As per HPI. Otherwise, a complete review of systems is negative.  PAST MEDICAL HISTORY: Past Medical History:  Diagnosis Date  . Anemia   . Anxiety   . Chronic kidney disease    DIALYSIS  . Depression   . Diabetes mellitus without complication ([place])   . Dialysis patient ([place])    Mon, Wed, Fri    . History of chemotherapy    chemo on Tuesday and Wednesday  . Hypertension   . Multiple myeloma ([place])   . Neuropathy associated with cancer ([place])   . Osteoarthritis   . Pancreatitis   . Pneumonia    September 2017, [facility]  . Post-menopausal 2014  . Sepsis ([place])     PAST SURGICAL HISTORY: Past Surgical History:  Procedure Laterality Date  . A/V SHUNT INTERVENTION Left 07/07/2016   Procedure: A/V Shunt Intervention;  Surgeon: Algernon Huxley, MD;  Location: [facility];  Service: Cardiovascular;  Laterality: Left;  . AV FISTULA PLACEMENT Left 02/20/2016   Procedure: INSERTION OF ARTERIOVENOUS (AV) GORE-TEX GRAFT ARM;  Surgeon: Katha Cabal, MD;  Location: [facility];  Service: Vascular;  Laterality: Left;  . CESAREAN SECTION     x2  . dialysis catheter placement    . PERIPHERAL VASCULAR CATHETERIZATION N/A 11/19/2015   Procedure: Dialysis/Perma Catheter Insertion;  Surgeon: Algernon Huxley, MD;  Location: [facility];  Service: Cardiovascular;  Laterality: N/A;  . PERIPHERAL VASCULAR CATHETERIZATION Left 03/10/2016   Procedure: Thrombectomy;  Surgeon: Katha Cabal, MD;  Location: [facility];  Service: Cardiovascular;  Laterality: Left;  . PERIPHERAL VASCULAR CATHETERIZATION N/A 03/31/2016   Procedure: Dialysis/Perma Catheter Removal;  Surgeon: Katha Cabal, MD;  Location: [facility];  Service: Cardiovascular;  Laterality: N/A;  . PORTACATH PLACEMENT    . TUBAL LIGATION      FAMILY HISTORY: Family History  Problem Relation Age of Onset  . Hypercholesterolemia Mother   . Hypertension Mother   . Hypertension Father     ADVANCED DIRECTIVES (Y/N):  @ADVDIR @  HEALTH MAINTENANCE: Social History  Substance Use Topics  . Smoking status: Never Smoker  . Smokeless tobacco: Never Used  . Alcohol use No     Colonoscopy:  PAP:  Bone density:  Lipid panel:  No Known Allergies  Current Facility-Administered Medications  Medication Dose  Route Frequency Provider Last Rate Last Dose  . 0.9 %  sodium chloride infusion   Intravenous Continuous Hugelmeyer, Alexis, DO   Stopped at 10/21/16 0849  . 0.9 %  sodium chloride infusion   Intravenous Once Hugelmeyer, Alexis, DO      . acetaminophen (TYLENOL) tablet 650 mg  650 mg Oral Q6H PRN Hugelmeyer, Alexis, DO       Or  . acetaminophen (TYLENOL) suppository 650 mg  650 mg Rectal Q6H PRN Hugelmeyer, Alexis, DO      . albuterol (PROVENTIL) (2.5 MG/3ML) 0.083% nebulizer solution 2.5 mg  2.5 mg Nebulization Q6H PRN Hugelmeyer, Alexis, DO      . fentaNYL (DURAGESIC - dosed mcg/hr) patch 25 mcg  25 mcg Transdermal Q72H Hugelmeyer, Alexis, DO   25 mcg at 10/21/16 0116  . ferric citrate (AURYXIA) tablet 420 mg  420 mg Oral TID WC Kolluru, Sarath, MD   420 mg at 10/21/16 1728  . insulin aspart (novoLOG) injection 0-5 Units  0-5 Units Subcutaneous QHS Hugelmeyer, Alexis, DO      . insulin aspart (novoLOG) injection 0-9 Units  0-9 Units Subcutaneous TID WC Hugelmeyer, Alexis, DO   2 Units at 10/21/16 1250  . insulin detemir (LEVEMIR) injection 5 Units  5 Units Subcutaneous Q2200 Hugelmeyer, Alexis, DO   5 Units at 10/21/16 0115  . ipratropium (ATROVENT) nebulizer solution 0.5 mg  0.5 mg Nebulization Q6H PRN Hugelmeyer, Alexis, DO      . ondansetron (ZOFRAN) tablet 4 mg  4 mg Oral Q8H PRN Hugelmeyer, Alexis, DO      . oxyCODONE (Oxy IR/ROXICODONE) immediate release tablet 10 mg  10 mg Oral Q4H PRN Henreitta Leber, MD      . prochlorperazine (COMPAZINE) tablet 10 mg  10 mg Oral Q6H PRN Hugelmeyer, Alexis, DO      . senna (SENOKOT) tablet 8.6 mg  1 tablet Oral Daily Hugelmeyer, Alexis, DO   8.6 mg at 10/21/16 0850  . verapamil (CALAN-SR) CR tablet 120 mg  120 mg Oral Daily Hugelmeyer, Alexis, DO   120 mg at 10/21/16 3419   Facility-Administered Medications Ordered in Other Encounters  Medication Dose Route Frequency Provider Last Rate Last Dose  . LORazepam (ATIVAN) injection 1 mg  1 mg Intravenous  Once Lloyd Huger, MD        OBJECTIVE: Vitals:   10/21/16 2049 10/21/16 2134  BP: (!) 146/56 (!) 157/54  Pulse: 92 91  Resp: 20 (!) 22  Temp: 99.2 F (37.3 C) 99.2 F (37.3 C)  SpO2:       Body mass index is 20.43 kg/m.    ECOG FS:3 - Symptomatic, >50% confined to bed  General: Well-developed, well-nourished, no acute distress. Eyes: Pink conjunctiva, anicteric sclera. HEENT: Normocephalic, moist mucous membranes, clear oropharnyx. Lungs: Clear to auscultation bilaterally. Heart: Regular rate and rhythm. No rubs, murmurs, or gallops. Abdomen: Soft, nontender, nondistended. No organomegaly noted, normoactive bowel sounds. Musculoskeletal: No edema, cyanosis, or clubbing. Neuro: Alert, answering all questions appropriately. Cranial nerves grossly intact. Skin: No rashes or petechiae noted. Psych: Normal affect. Lymphatics: No cervical, calvicular, axillary or inguinal LAD.   LAB RESULTS:  Lab Results  Component Value Date  NA 136 10/21/2016   K 3.3 (L) 10/21/2016   CL 96 (L) 10/21/2016   CO2 30 10/21/2016   GLUCOSE 92 10/21/2016   BUN 34 (H) 10/21/2016   CREATININE 3.45 (H) 10/21/2016   CALCIUM 9.0 10/21/2016   PROT 7.3 09/21/2016   ALBUMIN 3.7 09/21/2016   AST 25 09/21/2016   ALT 12 (L) 09/21/2016   ALKPHOS 144 (H) 09/21/2016   BILITOT 0.7 09/21/2016   GFRNONAA 14 (L) 10/21/2016   GFRAA 16 (L) 10/21/2016    Lab Results  Component Value Date   WBC 2.5 (L) 10/21/2016   NEUTROABS 5.4 09/21/2016   HGB 6.3 (L) 10/21/2016   HCT 18.6 (L) 10/21/2016   MCV 91.2 10/21/2016   PLT 52 (L) 10/21/2016     STUDIES: No results found.  ASSESSMENT: Progressive multiple myeloma, severe anemia and thrombocytopenia.  PLAN:    1. Multiple Myeloma in relapse with multiple bony lytic lesions: PET scan results from September 14, 2016 revealed significant progression of disease. Also, her kappa free light chains have increased significantly to greater then 9800. Plan was  to initiate 3 mg PO Ixazomib on days 1, 8, and 15 with day 22 off, but patient has yet to receive this drug. She did initiate 5 mg Revlimid approximately one week ago. Have discontinued Revlimid in the setting of severe anemia and thrombocytopenia. Patient no longer receives Xgeva given her history of hypocalcemia. Because of patient's immigration status, she could not undergo transplant.  2. Anemia: Patient's hemoglobin remains significantly low, but improved since admission. Given multiple antibodies in her blood, is difficult to obtain a match. The radiologist is likely multifactorial including progressive disease, treatment with Revlimid, as well as end-stage renal disease. Attempt to maintain hemoglobin greater than 7.0. 3. End-stage renal disease: Continue dialysis on Monday, Wednesdays and Fridays. Has fistula. Similar nephrology consult if needed. 4. Thrombocytopenia: Possibly secondary to Revlimid and/or progression of disease. Revlimid has been discontinued she is an inpatient. Monitor. 5. Depression: Continue Celexa 20 mg daily.  6. Pain: Continue current narcotic regimen. 7. History of hypocalcemia: Patient's calcium levels are currently within normal limits. 8. Prognosis: Recently discussed the possibility of hospice and of life care. Patient is not interested in pursuing this at this time and wishes to continue with aggressive treatment. She did acknowledge that her treatment options are limited and end-of-life care we will likely need to be discussed again in the future. Consider palliative care consult.  Appreciate consult, will follow.  Lloyd Huger, MD   10/21/2016 10:01 PM

## 2016-10-21 NOTE — Progress Notes (Signed)
1 unit pRBC transfused over 3 hours per Dr. Juleen China. VSS. Lab to draw Hgb in 1 hour.   Pt and son at bedside updated on current plan of care: get oncology/hematology involved, get second unit of blood when arrives later today (blood bank to call unit when available). Both verbalized understanding.

## 2016-10-21 NOTE — Progress Notes (Signed)
Spoke with Myrna in Blood Bank. Blood just arrived and is being tested. Will report to night RN to follow up.

## 2016-10-21 NOTE — Progress Notes (Signed)
PT Cancellation Note  Patient Details Name: Jamie Keller MRN: [id number] DOB: Feb 11, 1966   Cancelled Treatment:    Reason Eval/Treat Not Completed: Medical issues which prohibited therapy (Consult received and chart reviewed.  Current HgB noted at 4.8, pending 2 units PRBC this date.  Exertional activity contraindicated at this time.  Will hold for now and re-attempt at later time/date as patient medically appropriate)   Marry Kusch H. Owens Shark, PT, DPT, NCS 10/21/16, 9:23 AM [phone]

## 2016-10-21 NOTE — Progress Notes (Signed)
Called again to lab as they have not drawn post-blood transfusion Hgb due at 1345.  Blood bank states they have not received blood flying from [place]- no update but do have 1 unit O- unit available if pt urgently needs.

## 2016-10-21 NOTE — Care Management Note (Signed)
Case Management Note  Patient Details  Name: Jamie Keller MRN: [id number] Date of Birth: 1965/07/02  Subjective/Objective:    Admitted to [facility] with the diagnosis of anemia. Lives with husband, Beatriz Chancellor [phone]). Last was at [facility] and seen Dr. William Hamburger. Prescriptions are filled at [facility]. States her husband pays for the medication. States she applied for medicaid mid - July. [facility] and Medication applications given in the past. Established  dialysis since December 2016. [facility] on [place] Monday - Wednesday Friday. Husband transports. Wheelchair in the home. No falls. Good appetite.                  Action/Plan: Referral for medications. Applications given in the past,  States she never went to the [facility] or Medication Management. Medicaid application in progress.   Expected Discharge Date:  10/24/16               Expected Discharge Plan:     In-House Referral:     Discharge planning Services     Post Acute Care Choice:    Choice offered to:     DME Arranged:    DME Agency:     HH Arranged:    HH Agency:     Status of Service:     If discussed at [employer], dates discussed:    Additional Comments:  Shelbie Ammons, RN MSN CCM Care Management [phone] 10/21/2016, 8:52 AM

## 2016-10-21 NOTE — Progress Notes (Signed)
Q4H Hgb re-ordered and will be drawn now and every 4 hours going forward. Confusion with Epic orders and what the lab sees over night so no Hgb drawn since 2000.

## 2016-10-21 NOTE — Progress Notes (Addendum)
[facility]: Hgb 4.8 Hct 14.8  Dr. Verdell Carmine rounding on pt today- paged to be notified. Per Dr. Verdell Carmine contact Oncology to see if they want to do anything while waiting on blood to arrive from [place] later today.

## 2016-10-21 NOTE — Progress Notes (Signed)
[facility]  ROUNDING NOTE   Subjective:   [name] admitted to [facility] on 10/20/2016 for Thrombocytopenia ([place]) [D69.6] Symptomatic anemia [D64.9] Diarrhea, unspecified type [R19.7]   Last hemodialysis yesterday  History taken with assistance of Spanish Interpreter.   Objective:  Vital signs in last 24 hours:  Temp:  [98.5 F (36.9 C)-100.3 F (37.9 C)] 98.5 F (36.9 C) (08/16 0910) Pulse Rate:  [109-122] 110 (08/16 0910) Resp:  [12-26] 18 (08/16 0910) BP: (146-166)/(44-63) 146/52 (08/16 0910) SpO2:  [91 %-99 %] 96 % (08/16 0910) Weight:  [47.2 kg (104 lb)-47.4 kg (104 lb 9.6 oz)] 47.4 kg (104 lb 9.6 oz) (08/16 0523)  Weight change:  Filed Weights   10/20/16 2000 10/21/16 0523  Weight: 47.2 kg (104 lb) 47.4 kg (104 lb 9.6 oz)    Intake/Output: I/O last 3 completed shifts: In: 288.8 [I.V.:288.8] Out: 100 [Urine:100]   Intake/Output this shift:  Total I/O In: 240 [P.O.:240] Out: -   Physical Exam: General: NAD,   Head: Normocephalic, atraumatic. Moist oral mucosal membranes  Eyes: Anicteric, PERRL  Neck: Supple, trachea midline  Lungs:  Clear to auscultation  Heart: Regular rate and rhythm  Abdomen:  Soft, nontender,   Extremities: no peripheral edema.  Neurologic: Nonfocal, moving all four extremities  Skin: No lesions  Access: Left arm AVF    Basic Metabolic Panel:  Recent Labs Lab 10/20/16 2010 10/20/16 2011 10/21/16 0724  NA 132*  --  136  K 3.1*  --  3.3*  CL 91*  --  96*  CO2 29  --  30  GLUCOSE 146*  --  92  BUN 22*  --  34*  CREATININE 2.38*  --  3.45*  CALCIUM 9.4  --  9.0  MG  --  1.7  --   PHOS  --  3.9  --     Liver Function Tests: No results for input(s): AST, ALT, ALKPHOS, BILITOT, PROT, ALBUMIN in the last 168 hours. No results for input(s): LIPASE, AMYLASE in the last 168 hours. No results for input(s): AMMONIA in the last 168 hours.  CBC:  Recent Labs Lab 10/20/16 2010 10/21/16 0724  WBC  2.5* 2.5*  HGB 5.5* 4.8*  HCT 16.6* 14.4*  MCV 93.5 91.2  PLT 67* 52*    Cardiac Enzymes:  Recent Labs Lab 10/20/16 2010 10/21/16 0209 10/21/16 0724  TROPONINI 0.27* 0.23* 0.23*    BNP: Invalid input(s): POCBNP  CBG:  Recent Labs Lab 10/20/16 2134 10/21/16 0027 10/21/16 0729  GLUCAP 171* 144* 58    Microbiology: Results for orders placed or performed during the hospital encounter of 10/20/16  MRSA PCR Screening     Status: None   Collection Time: 10/21/16  3:14 AM  Result Value Ref Range Status   MRSA by PCR NEGATIVE NEGATIVE Final    Comment:        The GeneXpert MRSA Assay (FDA approved for NASAL specimens only), is one component of a comprehensive MRSA colonization surveillance program. It is not intended to diagnose MRSA infection nor to guide or monitor treatment for MRSA infections.     Coagulation Studies: No results for input(s): LABPROT, INR in the last 72 hours.  Urinalysis:  Recent Labs  10/21/16 0538  COLORURINE STRAW*  LABSPEC 1.004*  PHURINE 8.0  GLUCOSEU 150*  HGBUR SMALL*  BILIRUBINUR NEGATIVE  KETONESUR NEGATIVE  PROTEINUR 30*  NITRITE NEGATIVE  LEUKOCYTESUR MODERATE*      Imaging: No results found.   Medications:   .  sodium chloride Stopped (10/21/16 0849)  . sodium chloride     . fentaNYL  25 mcg Transdermal Q72H  . insulin aspart  0-5 Units Subcutaneous QHS  . insulin aspart  0-9 Units Subcutaneous TID WC  . insulin detemir  5 Units Subcutaneous Q2200  . lenalidomide  5 mg Oral Daily  . senna  1 tablet Oral Daily  . verapamil  120 mg Oral Daily   acetaminophen **OR** acetaminophen, albuterol, ipratropium, ondansetron, oxyCODONE, prochlorperazine  Assessment/ Plan:  [name] is a 51 y.o. Hispanic female Spanish speaking only  with diabetes mellitus type II, hypertension, multiple myeloma, ESRD on hemodialysis.   [facility] MWF [address] AVF  1. End Stage Renal Disease: last  hemodialysis was yesterday.  ESRD secondary to multiple myeloma - Continue MWF schedule. Treatment for later today after fistulogram.   2.  Anemia of chronic kidney disease: hemoglobin 4.8. With pancytopenia from multiple myeloma - epo with HD treatment.  - PRBC transfusion ordered - Appreciate heme/onc input  3. Hypertension: blood pressure at goal 146/52 - verapamil.   4. Secondary Hyperparathyroidism with hypercalcemia and hyperphosphatemia: outpatient PTH 35 - low, outpatient calcium 11. Phosphorus 11.3  Calcium improved to 9 on admission - Auryxia with meals. - Hypercalcemia can be secondary to Multiple myeloma   LOS: 1 Jamie Keller 8/16/201810:53 AM

## 2016-10-21 NOTE — Progress Notes (Signed)
Notified by blood bank 1 unit pRBC matched & available currently in house. Interpretor paged to get consent signed, educate patient on blood transfusion, and update on plan of care.

## 2016-10-21 NOTE — Progress Notes (Signed)
[facility] [place] NAME: Jamie Keller    MR#:  [id number]  [place]:  22-Jun-1965  SUBJECTIVE:   Patient here due to weakness and noted to be significantly anemic.  Patient has a history of multiple myeloma and therefore has multiple antibodies and awaiting antibody free blood transfusion to be done later today. Getting O (-) blood presently.  Son at bedside.  No complaints presently.   REVIEW OF SYSTEMS:    Review of Systems  Constitutional: Negative for chills and fever.  HENT: Negative for congestion and tinnitus.   Eyes: Negative for blurred vision and double vision.  Respiratory: Negative for cough, shortness of breath and wheezing.   Cardiovascular: Negative for chest pain, orthopnea and PND.  Gastrointestinal: Negative for abdominal pain, diarrhea, nausea and vomiting.  Genitourinary: Negative for dysuria and hematuria.  Neurological: Positive for weakness. Negative for dizziness, sensory change and focal weakness.  All other systems reviewed and are negative.   Nutrition: Regular  Tolerating Diet: Yes Tolerating PT: Ambulatory  DRUG ALLERGIES:  No Known Allergies  VITALS:  Blood pressure (!) 129/48, pulse 84, temperature 97.8 F (36.6 C), temperature source Axillary, resp. rate 18, height 5' (1.524 m), weight 47.4 kg (104 lb 9.6 oz), last menstrual period 02/25/1995, SpO2 97 %.  PHYSICAL EXAMINATION:   Physical Exam  GENERAL:  51 y.o.-year-old patient pale appearing female lying in bed in no acute distress.  EYES: Pupils equal, round, reactive to light and accommodation. No scleral icterus. Extraocular muscles intact.  HEENT: Head atraumatic, normocephalic. Oropharynx and nasopharynx clear.  NECK:  Supple, no jugular venous distention. No thyroid enlargement, no tenderness.  LUNGS: Normal breath sounds bilaterally, no wheezing, rales, rhonchi. No use of accessory muscles of respiration.  CARDIOVASCULAR: S1,  S2 normal. No murmurs, rubs, or gallops.  ABDOMEN: Soft, nontender, nondistended. Bowel sounds present. No organomegaly or mass.  EXTREMITIES: No cyanosis, clubbing or edema b/l.    NEUROLOGIC: Cranial nerves II through XII are intact. No focal Motor or sensory deficits b/l. Globally weak   PSYCHIATRIC: The patient is alert and oriented x 3.  SKIN: No obvious rash, lesion, or ulcer.    LABORATORY PANEL:   CBC  Recent Labs Lab 10/21/16 0724  WBC 2.5*  HGB 4.8*  HCT 14.4*  PLT 52*   ------------------------------------------------------------------------------------------------------------------  Chemistries   Recent Labs Lab 10/20/16 2011 10/21/16 0724  NA  --  136  K  --  3.3*  CL  --  96*  CO2  --  30  GLUCOSE  --  92  BUN  --  34*  CREATININE  --  3.45*  CALCIUM  --  9.0  MG 1.7  --    ------------------------------------------------------------------------------------------------------------------  Cardiac Enzymes  Recent Labs Lab 10/21/16 0724  TROPONINI 0.23*   ------------------------------------------------------------------------------------------------------------------  RADIOLOGY:  No results found.   ASSESSMENT AND PLAN:   51 year old female with past medical history of multiple myeloma, chronic anemia, end-stage renal disease on hemodialysis, osteoarthritis, hypertension who presented to the hospital due to weakness and lethargy and noted to be severely anemic.  1. Anemia/Panctyopenia - due to pt's Multiple Myeloma.   - pt. Getting transfused O (-) blood now and will get antibody free blood later today as it's coming in from [place] and will cont. Supportive care.   - await further Oncology input.    2. Hx of Multiple Myeloma - await further Oncology input (discussed w/ Dr. Grayland Ormond)  3. End-stage renal  disease on hemodialysis-nephrology has been consulted. Continue dialysis on Monday Wednesday and Friday.  4. Diabetes type 2 without  complication-continue Levemir, sling scale insulin.  5. Chronic pain-secondary to the multiple myeloma. Continue fentanyl patch, oxycodone as needed.  6. HTN - cont. Cardizem.     All the records are reviewed and case discussed with Care Management/Social Worker. Management plans discussed with the patient, family and they are in agreement.  CODE STATUS: Full code  DVT Prophylaxis: Ted's & SCD's.   TOTAL TIME TAKING CARE OF THIS PATIENT: 30 minutes.   POSSIBLE D/C IN 1-2 DAYS, DEPENDING ON CLINICAL CONDITION.   Henreitta Leber M.D on 10/21/2016 at 1:37 PM  Between 7am to 6pm - Pager - [phone]  After 6pm go to [email or web address] - Proofreader  [facility]  Office  [phone]  CC: Primary care physician; Theotis Burrow, MD

## 2016-10-21 NOTE — Progress Notes (Signed)
Dr. Verdell Carmine notified Hgb 5.9 (after 1 unit pRBC) and Troponin 0.29. Waiting for specific blood unit from [place] to arrive to infuse second unit.

## 2016-10-22 ENCOUNTER — Inpatient Hospital Stay: Payer: Medicaid Other

## 2016-10-22 LAB — RENAL FUNCTION PANEL
ALBUMIN: 2.2 g/dL — AB (ref 3.5–5.0)
ANION GAP: 11 (ref 5–15)
BUN: 37 mg/dL — ABNORMAL HIGH (ref 6–20)
CO2: 28 mmol/L (ref 22–32)
Calcium: 8.8 mg/dL — ABNORMAL LOW (ref 8.9–10.3)
Chloride: 95 mmol/L — ABNORMAL LOW (ref 101–111)
Creatinine, Ser: 2.94 mg/dL — ABNORMAL HIGH (ref 0.44–1.00)
GFR calc Af Amer: 20 mL/min — ABNORMAL LOW (ref >60)
GFR, EST NON AFRICAN AMERICAN: 17 mL/min — AB (ref >60)
Glucose, Bld: 150 mg/dL — ABNORMAL HIGH (ref 65–99)
PHOSPHORUS: 3.5 mg/dL (ref 2.5–4.6)
POTASSIUM: 3.1 mmol/L — AB (ref 3.5–5.1)
Sodium: 134 mmol/L — ABNORMAL LOW (ref 135–145)

## 2016-10-22 LAB — PREPARE RBC (CROSSMATCH)

## 2016-10-22 LAB — CBC
HCT: 21.6 % — ABNORMAL LOW (ref 35.0–47.0)
Hemoglobin: 7.5 g/dL — ABNORMAL LOW (ref 12.0–16.0)
MCH: 31.3 pg (ref 26.0–34.0)
MCHC: 34.6 g/dL (ref 32.0–36.0)
MCV: 90.5 fL (ref 80.0–100.0)
PLATELETS: 51 10*3/uL — AB (ref 150–440)
RBC: 2.38 MIL/uL — AB (ref 3.80–5.20)
RDW: 15.1 % — AB (ref 11.5–14.5)
WBC: 2.7 10*3/uL — ABNORMAL LOW (ref 3.6–11.0)

## 2016-10-22 LAB — GLUCOSE, CAPILLARY
GLUCOSE-CAPILLARY: 100 mg/dL — AB (ref 65–99)
Glucose-Capillary: 101 mg/dL — ABNORMAL HIGH (ref 65–99)
Glucose-Capillary: 200 mg/dL — ABNORMAL HIGH (ref 65–99)

## 2016-10-22 LAB — HEMOGLOBIN AND HEMATOCRIT, BLOOD
HCT: 21.4 % — ABNORMAL LOW (ref 35.0–47.0)
HCT: 19.7 % — ABNORMAL LOW (ref 35.0–47.0)
HEMOGLOBIN: 7.2 g/dL — AB (ref 12.0–16.0)
Hemoglobin: 6.8 g/dL — ABNORMAL LOW (ref 12.0–16.0)

## 2016-10-22 MED ORDER — EPOETIN ALFA 10000 UNIT/ML IJ SOLN
10000.0000 [IU] | INTRAMUSCULAR | Status: DC
  Administered 2016-10-22: 10000 [IU] via INTRAVENOUS
  Filled 2016-10-22: qty 1

## 2016-10-22 MED ORDER — SODIUM CHLORIDE 0.9 % IV SOLN
Freq: Once | INTRAVENOUS | Status: AC
  Administered 2016-10-22: 18:00:00 via INTRAVENOUS

## 2016-10-22 NOTE — Progress Notes (Signed)
[place] [facility] [place] NAME: Jamie Keller    MR#:  [id number]  [place]:  1965-03-18  SUBJECTIVE:   Patient here due to weakness and noted to be significantly anemic.  Transfused yesterday and Hg. Improved to 7.2 and stable.  Pt. Seen with help of interpreter.  Still has some weakness but no other complaints.   REVIEW OF SYSTEMS:    Review of Systems  Constitutional: Negative for chills and fever.  HENT: Negative for congestion and tinnitus.   Eyes: Negative for blurred vision and double vision.  Respiratory: Negative for cough, shortness of breath and wheezing.   Cardiovascular: Negative for chest pain, orthopnea and PND.  Gastrointestinal: Negative for abdominal pain, diarrhea, nausea and vomiting.  Genitourinary: Negative for dysuria and hematuria.  Neurological: Positive for weakness. Negative for dizziness, sensory change and focal weakness.  All other systems reviewed and are negative.   Nutrition: Regular  Tolerating Diet: Yes Tolerating PT: Ambulatory  DRUG ALLERGIES:  No Known Allergies  VITALS:  Blood pressure (!) 140/53, pulse 82, temperature 98.6 F (37 C), temperature source Oral, resp. rate 18, height 5' (1.524 m), weight 47.5 kg (104 lb 11.5 oz), last menstrual period 02/25/1995, SpO2 99 %.  PHYSICAL EXAMINATION:   Physical Exam  GENERAL:  51 y.o.-year-old patient pale appearing female lying in bed in no acute distress.  EYES: Pupils equal, round, reactive to light and accommodation. No scleral icterus. Extraocular muscles intact.  HEENT: Head atraumatic, normocephalic. Oropharynx and nasopharynx clear.  NECK:  Supple, no jugular venous distention. No thyroid enlargement, no tenderness.  LUNGS: Normal breath sounds bilaterally, no wheezing, rales, rhonchi. No use of accessory muscles of respiration.  CARDIOVASCULAR: S1, S2 normal. No murmurs, rubs, or gallops.  ABDOMEN: Soft, nontender, nondistended.  Bowel sounds present. No organomegaly or mass.  EXTREMITIES: No cyanosis, clubbing or edema b/l.    NEUROLOGIC: Cranial nerves II through XII are intact. No focal Motor or sensory deficits b/l. Globally weak   PSYCHIATRIC: The patient is alert and oriented x 3.  SKIN: No obvious rash, lesion, or ulcer.    LABORATORY PANEL:   CBC  Recent Labs Lab 10/22/16 0210 10/22/16 0554  WBC 2.7*  --   HGB 7.5* 7.2*  HCT 21.6* 21.4*  PLT 51*  --    ------------------------------------------------------------------------------------------------------------------  Chemistries   Recent Labs Lab 10/20/16 2011 10/21/16 0724  NA  --  136  K  --  3.3*  CL  --  96*  CO2  --  30  GLUCOSE  --  92  BUN  --  34*  CREATININE  --  3.45*  CALCIUM  --  9.0  MG 1.7  --    ------------------------------------------------------------------------------------------------------------------  Cardiac Enzymes  Recent Labs Lab 10/21/16 1645  TROPONINI 0.29*   ------------------------------------------------------------------------------------------------------------------  RADIOLOGY:  Dg Knee 1-2 Views Right  Result Date: 10/22/2016 CLINICAL DATA:  Right knee pain without known injury. EXAM: RIGHT KNEE - 1-2 VIEW COMPARISON:  None. FINDINGS: No evidence of fracture, dislocation, or joint effusion. Mild narrowing of medial joint space is noted. Soft tissues are unremarkable. IMPRESSION: Mild degenerative joint disease. No acute abnormality seen in the right knee. Electronically Signed   By: Marijo Conception, M.D.   On: 10/22/2016 10:27     ASSESSMENT AND PLAN:   51 year old female with past medical history of multiple myeloma, chronic anemia, end-stage renal disease on hemodialysis, osteoarthritis, hypertension who presented to the hospital due to weakness  and lethargy and noted to be severely anemic.  1. Anemia/Panctyopenia - due to pt's Multiple Myeloma.   - pt. Transfused yesterday and Hg.  Improved to 7.2 and stable. No acute bleeding.   - Seen by Oncology and Revlimid on hold given anemia/thrombocytopenia.   - follow serial counts.   2. Hx of Multiple Myeloma - prognosis poor, seen by oncology and patient has had relapse with multiple lytic lesions.  Plan was to initiate 3 mg PO Ixazomib on days 1, 8, and 15 with day 22 off, but patient has yet to receive this drug. She did initiate 5 mg Revlimid approximately one week ago. Have discontinued Revlimid in the setting of severe anemia and thrombocytopenia. Patient no longer receives Xgeva given her history of hypocalcemia. Because of patient's immigration status, she could not undergo transplant.  - Oncology recommend Palliative care consult which has been placed.   3. End-stage renal disease on hemodialysis-nephrology has been consulted.  - getting HD on MWF.  4. Diabetes type 2 without complication-continue Levemir, sling scale insulin. - BS stable.   5. Chronic pain-secondary to the multiple myeloma. Continue fentanyl patch, oxycodone as needed.  6. HTN - cont. Cardizem.    Possible d/c home tomorrow if Hg. Stable.   All the records are reviewed and case discussed with Care Management/Social Worker. Management plans discussed with the patient, family and they are in agreement.  CODE STATUS: Full code  DVT Prophylaxis: Ted's & SCD's.   TOTAL TIME TAKING CARE OF THIS PATIENT: 30 minutes.   POSSIBLE D/C IN 1-2 DAYS, DEPENDING ON CLINICAL CONDITION.   Henreitta Leber M.D on 10/22/2016 at 12:17 PM  Between 7am to 6pm - Pager - [phone]  After 6pm go to [email or web address] - Proofreader  [employer] [facility]  Office  [phone]  CC: Primary care physician; Theotis Burrow, MD

## 2016-10-22 NOTE — Progress Notes (Signed)
Palliative Medicine Team  Due to high volume of referrals, there is a delay seeing this patient. PMT not at [facility] over the weekend but will arrange goals of care with patient and family on Monday. If discharged over the weekend, may benefit from outpatient palliative referral for initial goals of care conversation. Thank you for the opportunity to participate in the care of Jamie Keller.  Ihor Dow, FNP-C Palliative Medicine Team  Phone: [phone] Fax: [phone]

## 2016-10-22 NOTE — Progress Notes (Signed)
Post hd assessment 

## 2016-10-22 NOTE — Plan of Care (Signed)
Problem: Safety: Goal: Ability to remain free from injury will improve Outcome: Progressing No injury to dialysis today tol dialysis well  1 unit pcs started this pm.

## 2016-10-22 NOTE — Progress Notes (Signed)
[facility]  ROUNDING NOTE   Subjective:   Seen and examined on hemodialysis. Tolerating treatment well. UF 0.5  History taken with assistance of Spanish Interpreter.   Objective:  Vital signs in last 24 hours:  Temp:  [97.8 F (36.6 C)-100.2 F (37.9 C)] 98.6 F (37 C) (08/17 1047) Pulse Rate:  [83-103] 83 (08/17 1130) Resp:  [18-24] 24 (08/17 1130) BP: (129-169)/(48-66) 142/53 (08/17 1130) SpO2:  [97 %-100 %] 99 % (08/17 1130) Weight:  [47.5 kg (104 lb 11.5 oz)] 47.5 kg (104 lb 11.5 oz) (08/17 1047)  Weight change:  Filed Weights   10/20/16 2000 10/21/16 0523 10/22/16 1047  Weight: 47.2 kg (104 lb) 47.4 kg (104 lb 9.6 oz) 47.5 kg (104 lb 11.5 oz)    Intake/Output: I/O last 3 completed shifts: In: 1418.8 [P.O.:480; I.V.:298.8; Blood:640] Out: 100 [Urine:100]   Intake/Output this shift:  No intake/output data recorded.  Physical Exam: General: NAD  Head: Normocephalic, atraumatic. Moist oral mucosal membranes  Eyes: Anicteric, PERRL  Neck: Supple, trachea midline  Lungs:  Clear to auscultation  Heart: Regular rate and rhythm  Abdomen:  Soft, nontender,   Extremities: no peripheral edema.  Neurologic: Nonfocal, moving all four extremities  Skin: No lesions  Access: Left arm AVF    Basic Metabolic Panel:  Recent Labs Lab 10/20/16 2010 10/20/16 2011 10/21/16 0724  NA 132*  --  136  K 3.1*  --  3.3*  CL 91*  --  96*  CO2 29  --  30  GLUCOSE 146*  --  92  BUN 22*  --  34*  CREATININE 2.38*  --  3.45*  CALCIUM 9.4  --  9.0  MG  --  1.7  --   PHOS  --  3.9  --     Liver Function Tests: No results for input(s): AST, ALT, ALKPHOS, BILITOT, PROT, ALBUMIN in the last 168 hours. No results for input(s): LIPASE, AMYLASE in the last 168 hours. No results for input(s): AMMONIA in the last 168 hours.  CBC:  Recent Labs Lab 10/20/16 2010 10/21/16 0724 10/21/16 1645 10/21/16 2042 10/22/16 0210 10/22/16 0554  WBC 2.5* 2.5*  --   --  2.7*   --   HGB 5.5* 4.8* 5.9* 6.3* 7.5* 7.2*  HCT 16.6* 14.4* 17.6* 18.6* 21.6* 21.4*  MCV 93.5 91.2  --   --  90.5  --   PLT 67* 52*  --   --  51*  --     Cardiac Enzymes:  Recent Labs Lab 10/20/16 2010 10/21/16 0209 10/21/16 0724 10/21/16 1645  TROPONINI 0.27* 0.23* 0.23* 0.29*    BNP: Invalid input(s): POCBNP  CBG:  Recent Labs Lab 10/21/16 0729 10/21/16 1150 10/21/16 1638 10/21/16 2045 10/22/16 0750  GLUCAP 90 170* 107* 190* 101*    Microbiology: Results for orders placed or performed during the hospital encounter of 10/20/16  MRSA PCR Screening     Status: None   Collection Time: 10/21/16  3:14 AM  Result Value Ref Range Status   MRSA by PCR NEGATIVE NEGATIVE Final    Comment:        The GeneXpert MRSA Assay (FDA approved for NASAL specimens only), is one component of a comprehensive MRSA colonization surveillance program. It is not intended to diagnose MRSA infection nor to guide or monitor treatment for MRSA infections.     Coagulation Studies: No results for input(s): LABPROT, INR in the last 72 hours.  Urinalysis:  Recent Labs  10/21/16  [place] 8.0  GLUCOSEU 150*  HGBUR SMALL*  BILIRUBINUR NEGATIVE  KETONESUR NEGATIVE  PROTEINUR 30*  NITRITE NEGATIVE  LEUKOCYTESUR MODERATE*      Imaging: Dg Knee 1-2 Views Right  Result Date: 10/22/2016 CLINICAL DATA:  Right knee pain without known injury. EXAM: RIGHT KNEE - 1-2 VIEW COMPARISON:  None. FINDINGS: No evidence of fracture, dislocation, or joint effusion. Mild narrowing of medial joint space is noted. Soft tissues are unremarkable. IMPRESSION: Mild degenerative joint disease. No acute abnormality seen in the right knee. Electronically Signed   By: Marijo Conception, M.D.   On: 10/22/2016 10:27     Medications:   . sodium chloride Stopped (10/21/16 0849)  . sodium chloride     . fentaNYL  25 mcg Transdermal Q72H  . ferric citrate  420 mg Oral TID  WC  . insulin aspart  0-5 Units Subcutaneous QHS  . insulin aspart  0-9 Units Subcutaneous TID WC  . insulin detemir  5 Units Subcutaneous Q2200  . senna  1 tablet Oral Daily  . verapamil  120 mg Oral Daily   acetaminophen **OR** acetaminophen, albuterol, ipratropium, ondansetron, oxyCODONE, prochlorperazine  Assessment/ Plan:  Jamie Keller is a 51 y.o. Hispanic female Spanish speaking only  with diabetes mellitus type II, hypertension, multiple myeloma, ESRD on hemodialysis.   [facility] MWF [address] AVF  1. End Stage Renal Disease:  Seen and examined with hemodialysis.  ESRD secondary to multiple myeloma - Continue MWF schedule. Treatment for later today after fistulogram.   2.  Anemia of chronic kidney disease: status post PRBC transfusion hemoglobin 7.2. With pancytopenia from multiple myeloma - epo with HD treatment.  - Appreciate heme/onc input  3. Hypertension: blood pressure at goal   - verapamil.   4. Secondary Hyperparathyroidism with hypercalcemia and hyperphosphatemia: outpatient PTH 35 - low, outpatient calcium 11. Phosphorus 11.3  Calcium improved to 9 on admission - Auryxia with meals. - Hypercalcemia can be secondary to Multiple myeloma   LOS: [place], [place] 8/17/201811:46 AM

## 2016-10-22 NOTE — Progress Notes (Signed)
Post hd vitals 

## 2016-10-22 NOTE — Progress Notes (Signed)
Pre hd info 

## 2016-10-22 NOTE — Progress Notes (Signed)
End of hd, pt alert, no c/o, stable 

## 2016-10-22 NOTE — Progress Notes (Signed)
Hd start, pt resting, c/o of nausea, stable

## 2016-10-22 NOTE — Evaluation (Signed)
Physical Therapy Evaluation Patient Details Name: Jamie Keller MRN: [id number] DOB: 09-20-1965 Today's Date: 10/22/2016   History of Present Illness  presented to ER secondary to generalized weakness with anemia (4.8) noted upon routine labwork; admitted with symptomatic anemia of chronic disease (current HgB 7.2). PMH significant for multiple myeloma with multiple bony metastases  Clinical Impression  Upon evaluation, patient alert and oriented; follows all commands and demonstrates good insight/safety awareness.  Bilat UE/LE strength and ROM grossly symmetrical and WFL, except R knee lacking full extension (approx -10 degrees) due to pain.  Able to complete bed mobility indep; sit/stand, basic transfers and short-distance gait (20') with RW, cga/close sup.  3-point gait pattern with decreased stance time/weight acceptance to R LE, but no overt buckling or LOB. May benefit from continued use of RW vs 4WRW to allow for increased WBing bilat UEs (to offset R LE). Would benefit from skilled PT to address above deficits and promote optimal return to PLOF; recommend transition to STR upon discharge from acute hospitalization.  Per attending physician, plan to x-ray R knee; however, okay for continued participation with therapy as tolerated (reports no visible deformity, no acute mechanism of injury).  Review of recent PET scan suggestive of increased activity R distal femur.    Follow Up Recommendations Home health PT    Equipment Recommendations  Rolling walker with 5" wheels    Recommendations for Other Services       Precautions / Restrictions Precautions Precautions: Fall Restrictions Weight Bearing Restrictions: No      Mobility  Bed Mobility Overal bed mobility: Modified Independent                Transfers Overall transfer level: Needs assistance Equipment used: Rolling walker (2 wheeled) Transfers: Sit to/from Stand Sit to Stand: Min guard         General  transfer comment: decreased active use/WBing R LE; cuing not to pull on RW  Ambulation/Gait Ambulation/Gait assistance: Min guard Ambulation Distance (Feet): 20 Feet Assistive device: Rolling walker (2 wheeled)       General Gait Details: 3-point step to gait pattern with decreased stance time/weight shift to R LE; maintains partial R knee flexion throughout gait cycle.  No overt buckling or LOB; good awareness of need for rest periods as needed  Stairs            Wheelchair Mobility    Modified Rankin (Stroke Patients Only)       Balance Overall balance assessment: Needs assistance Sitting-balance support: No upper extremity supported;Feet supported Sitting balance-Leahy Scale: Good     Standing balance support: Bilateral upper extremity supported Standing balance-Leahy Scale: Fair                               Pertinent Vitals/Pain Pain Assessment: Faces Faces Pain Scale: Hurts little more Pain Location: R knee Pain Descriptors / Indicators: Aching Pain Intervention(s): Limited activity within patient's tolerance;Monitored during session;Repositioned    Home Living Family/patient expects to be discharged to:: Private residence Living Arrangements: Spouse/significant other Available Help at Discharge: Family;Available PRN/intermittently Type of Home: House Home Access: Stairs to enter Entrance Stairs-Rails: None Entrance Stairs-Number of Steps: 2 Home Layout: One level Home Equipment: Walker - 4 wheels      Prior Function Level of Independence: Independent with assistive device(s)         Comments: Mod indep with [id number] for household and limited community mobility; indep with ADLs.  Denies fall history.  Husband assists with transport to/from dialysis.     Hand Dominance        Extremity/Trunk Assessment   Upper Extremity Assessment Upper Extremity Assessment: Overall WFL for tasks assessed    Lower Extremity Assessment Lower  Extremity Assessment: Overall WFL for tasks assessed (grossly at least 4-/5 throughout; lacks full R knee extension to due pain (approx -10 degrees))-not tested with resistance due to documented widespread bony metastases       Communication   Communication: Interpreter utilized Talbert Nan, interpreter services)  Cognition Arousal/Alertness: Awake/alert Behavior During Therapy: WFL for tasks assessed/performed Overall Cognitive Status: Within Functional Limits for tasks assessed                                        General Comments      Exercises     Assessment/Plan    PT Assessment Patient needs continued PT services  PT Problem List Decreased strength;Decreased range of motion;Decreased activity tolerance;Decreased balance;Decreased mobility;Decreased coordination;Decreased knowledge of use of DME;Decreased knowledge of precautions;Decreased safety awareness;Pain       PT Treatment Interventions DME instruction;Gait training;Stair training;Functional mobility training;Therapeutic activities;Therapeutic exercise;Patient/family education;Balance training    PT Goals (Current goals can be found in the Care Plan section)  Acute Rehab PT Goals Patient Stated Goal: to return home PT Goal Formulation: With patient Time For Goal Achievement: 11/05/16 Potential to Achieve Goals: Good    Frequency Min 2X/week   Barriers to discharge Decreased caregiver support      Co-evaluation               AM-PAC PT "6 Clicks" Daily Activity  Outcome Measure Difficulty turning over in bed (including adjusting bedclothes, sheets and blankets)?: None Difficulty moving from lying on back to sitting on the side of the bed? : None Difficulty sitting down on and standing up from a chair with arms (e.g., wheelchair, bedside commode, etc,.)?: A Little Help needed moving to and from a bed to chair (including a wheelchair)?: A Little Help needed walking in hospital room?: A  Little Help needed climbing 3-5 steps with a railing? : A Lot 6 Click Score: 19    End of Session Equipment Utilized During Treatment: Gait belt Activity Tolerance: Patient limited by fatigue;Patient limited by pain Patient left: in bed;with call bell/phone within reach;with bed alarm set Nurse Communication: Mobility status PT Visit Diagnosis: Muscle weakness (generalized) (M62.81);Difficulty in walking, not elsewhere classified (R26.2);Pain Pain - Right/Left: Right Pain - part of body: Knee    Time: 1518-3437 PT Time Calculation (min) (ACUTE ONLY): 19 min   Charges:   PT Evaluation $PT Eval Low Complexity: 1 Low     PT G Codes:   PT G-Codes **NOT FOR INPATIENT CLASS** Functional Assessment Tool Used: AM-PAC 6 Clicks Basic Mobility Functional Limitation: Mobility: Walking and moving around Mobility: Walking and Moving Around Current Status (D5789): At least 20 percent but less than 40 percent impaired, limited or restricted Mobility: Walking and Moving Around Goal Status [phone]): At least 1 percent but less than 20 percent impaired, limited or restricted    Meta Kroenke H. Owens Shark, PT, DPT, NCS 10/22/16, 11:11 AM [phone]

## 2016-10-22 NOTE — Progress Notes (Signed)
Pre hd assessment  

## 2016-10-23 LAB — HEPATITIS B SURFACE ANTIGEN: Hepatitis B Surface Ag: NEGATIVE

## 2016-10-23 LAB — GLUCOSE, CAPILLARY: GLUCOSE-CAPILLARY: 105 mg/dL — AB (ref 65–99)

## 2016-10-23 LAB — HEMOGLOBIN: HEMOGLOBIN: 8.4 g/dL — AB (ref 12.0–16.0)

## 2016-10-23 MED ORDER — FERRIC CITRATE 1 GM 210 MG(FE) PO TABS: 420.0000 mg | ORAL_TABLET | Freq: Three times a day (TID) | ORAL | 0 refills | Status: AC

## 2016-10-23 NOTE — Progress Notes (Addendum)
Interpreter services used for assessment and oral and written Spanish/English AVS with discharge instructions. Oral and written AVS instructions in Spanish and English done with son prior to discharge. Pt desires to take senna and calan at home upon arrival.  Denies co's/issues. Denies bleeding. Stressed importance to keep HD appts, keep appts at [facility] for labs/FU; diet and fluid restrictions with stated understanding by pt and son. Discharge home to self care/family. Transported in transport chair to private vehicle.

## 2016-10-23 NOTE — Care Management Note (Addendum)
Case Management Note  Patient Details  Name: [name] MRN: [id number] Date of Birth: 10-13-1965  Subjective/Objective:     Per previous CM note, Ms Manternach already has a RW in her home. A referral for HH-PT was called to Melene Muller at [facility] with a request to discuss any co-pays with Ms Derenzo family before initiating services.                Action/Plan:   Expected Discharge Date:  10/23/16               Expected Discharge Plan:     In-House Referral:     Discharge planning Services     Post Acute Care Choice:    Choice offered to:     DME Arranged:    DME Agency:     HH Arranged:    HH Agency:     Status of Service:     If discussed at [employer], dates discussed:    Additional Comments:  Terran Hollenkamp A, RN 10/23/2016, 8:38 AM

## 2016-10-23 NOTE — Discharge Summary (Signed)
[facility] NAME: Jamie Keller    MR#:  [id number]  DATE OF BIRTH:  Jul 13, 1965  DATE OF ADMISSION:  10/20/2016 ADMITTING PHYSICIAN: Harvie Bridge, DO  DATE OF DISCHARGE: 10/23/2016  PRIMARY CARE PHYSICIAN: Theotis Burrow, MD    ADMISSION DIAGNOSIS:  Thrombocytopenia ([place]) [D69.6] Symptomatic anemia [D64.9] Diarrhea, unspecified type [R19.7]  DISCHARGE DIAGNOSIS:  Symptomatic anemia due to progression of multiple myeloma s/p BT  SECONDARY DIAGNOSIS:   Past Medical History:  Diagnosis Date  . Anemia   . Anxiety   . Chronic kidney disease    DIALYSIS  . Depression   . Diabetes mellitus without complication ([place])   . Dialysis patient ([place])    Mon, Wed, Fri  . History of chemotherapy    chemo on Tuesday and Wednesday  . Hypertension   . Multiple myeloma ([place])   . Neuropathy associated with cancer ([place])   . Osteoarthritis   . Pancreatitis   . Pneumonia    September 2017, [facility]  . Post-menopausal 2014  . Sepsis [facility])     HOSPITAL COURSE:   51 year old female with past medical history of multiple myeloma, chronic anemia, end-stage renal disease on hemodialysis, osteoarthritis, hypertension who presented to the hospital due to weakness and lethargy and noted to be severely anemic.  1. Anemia/Panctyopenia - due to pt's Multiple Myeloma.   - pt came in with hgb of 5.Marland Kitchen5---1 unit BT---7.2--6.8--2nd unit--8.4 - Seen by Oncology-- pt's Ninlaro and Revlimid on hold given anemia/thrombocytopenia.   - started on ferric citrate by Nephrology--cont as out pt  2. Hx of Multiple Myeloma - prognosis poor, seen by oncology and patient has had relapse with multiple lytic lesions. Plan was to initiate 3 mg POIxazomib on days 1, 8, and 15 with day 22 off, but patient has yet to receive this drug. She did initiate 5 mg Revlimid approximately one week ago. Have discontinued Revlimid in the setting of severe  anemia and thrombocytopenia. Patient no longer receivesXgeva given her history of hypocalcemia. Because of patient's immigration status, she could not undergo transplant.  - Oncology recommend Palliative care consult which has been placed.   3. End-stage renal disease on hemodialysis-nephrology has been consulted.  - getting HD on MWF.  4. Diabetes type 2 without complication-continue Levemir, sling scale insulin. - BS stable.   5. Chronic pain-secondary to the multiple myeloma. Continue fentanyl patch, oxycodone as needed.  6. HTN - cont. verapamil  D/c home today with HHPT and RW vai interpreter discussed d/c plans CONSULTS OBTAINED:  Treatment Team:  Lavonia Dana, MD Lloyd Huger, MD  DRUG ALLERGIES:  No Known Allergies  DISCHARGE MEDICATIONS:   Current Discharge Medication List    START taking these medications   Details  ferric citrate (AURYXIA) 1 GM 210 MG(Fe) tablet Take 2 tablets (420 mg total) by mouth 3 (three) times daily with meals. Qty: 180 tablet, Refills: 0      CONTINUE these medications which have NOT CHANGED   Details  fentaNYL (DURAGESIC - DOSED MCG/HR) 25 MCG/HR patch Place 1 patch (25 mcg total) onto the skin every 3 (three) days. Qty: 10 patch, Refills: 0   Associated Diagnoses: Multiple myeloma in relapse (HCC)    Insulin Detemir (LEVEMIR FLEXPEN) 100 UNIT/ML Pen Inject 5 Units into the skin daily at 10 pm.    Insulin Pen Needle (NOVOFINE) 30G X 8 MM MISC Inject 10 each into the skin as needed. Qty: 5 packet,  Refills: 0    ondansetron (ZOFRAN) 8 MG tablet Take by mouth every 8 (eight) hours as needed for nausea or vomiting.    Oxycodone HCl 10 MG TABS Take 1 tablet (10 mg total) by mouth every 4 (four) hours as needed (take 1 tablet by mouth every 4 - 6 hours as needed for severe pain). Qty: 60 tablet, Refills: 0   Associated Diagnoses: Multiple myeloma in relapse (HCC)    prochlorperazine (COMPAZINE) 10 MG tablet Take 1 tablet  (10 mg total) by mouth every 6 (six) hours as needed for nausea or vomiting. Qty: 30 tablet, Refills: 1   Associated Diagnoses: Multiple myeloma in relapse ([place]); End stage renal disease (HCC)    senna (SENOKOT) 8.6 MG tablet Take 1 tablet by mouth daily.    verapamil (CALAN-SR) 120 MG CR tablet Take 120 mg by mouth daily.      STOP taking these medications     ixazomib citrate (NINLARO) 3 MG capsule      lenalidomide (REVLIMID) 5 MG capsule      predniSONE (STERAPRED UNI-PAK 21 TAB) 5 MG (21) TBPK tablet         If you experience worsening of your admission symptoms, develop shortness of breath, life threatening emergency, suicidal or homicidal thoughts you must seek medical attention immediately by calling 911 or calling your MD immediately  if symptoms less severe.  You Must read complete instructions/literature along with all the possible adverse reactions/side effects for all the Medicines you take and that have been prescribed to you. Take any new Medicines after you have completely understood and accept all the possible adverse reactions/side effects.   Please note  You were cared for by a hospitalist during your hospital stay. If you have any questions about your discharge medications or the care you received while you were in the hospital after you are discharged, you can call the unit and asked to speak with the hospitalist on call if the hospitalist that took care of you is not available. Once you are discharged, your primary care physician will handle any further medical issues. Please note that NO REFILLS for any discharge medications will be authorized once you are discharged, as it is imperative that you return to your primary care physician (or establish a relationship with a primary care physician if you do not have one) for your aftercare needs so that they can reassess your need for medications and monitor your lab values. Today   SUBJECTIVE    Feels better. Chronic  knee pain VITAL SIGNS:  Blood pressure (!) 164/62, pulse 93, temperature 99.3 F (37.4 C), temperature source Oral, resp. rate 20, height 5' (1.524 m), weight 47.5 kg (104 lb 11.5 oz), last menstrual period 02/25/1995, SpO2 99 %.  I/O:   Intake/Output Summary (Last 24 hours) at 10/23/16 0814 Last data filed at 10/22/16 2133  Gross per 24 hour  Intake              370 ml  Output              500 ml  Net             -130 ml    PHYSICAL EXAMINATION:  GENERAL:  51 y.o.-year-old patient lying in the bed with no acute distress. Pallor+ EYES: Pupils equal, round, reactive to light and accommodation. No scleral icterus. Extraocular muscles intact.  HEENT: Head atraumatic, normocephalic. Oropharynx and nasopharynx clear.  NECK:  Supple, no jugular venous distention. No  thyroid enlargement, no tenderness.  LUNGS: Normal breath sounds bilaterally, no wheezing, rales,rhonchi or crepitation. No use of accessory muscles of respiration.  CARDIOVASCULAR: S1, S2 normal. No murmurs, rubs, or gallops.  ABDOMEN: Soft, non-tender, non-distended. Bowel sounds present. No organomegaly or mass.  EXTREMITIES: No pedal edema, cyanosis, or clubbing. Left UE HD access NEUROLOGIC: Cranial nerves II through XII are intact. Muscle strength 5/5 in all extremities. Sensation intact. Gait not checked.  PSYCHIATRIC: The patient is alert and oriented x 3.  SKIN: No obvious rash, lesion, or ulcer.   DATA REVIEW:   CBC   Recent Labs Lab 10/22/16 0210  10/22/16 1145 10/23/16 0310  WBC 2.7*  --   --   --   HGB 7.5*  < > 6.8* 8.4*  HCT 21.6*  < > 19.7*  --   PLT 51*  --   --   --   < > = values in this interval not displayed.  Chemistries   Recent Labs Lab 10/20/16 2011  10/22/16 0830  NA  --   < > 134*  K  --   < > 3.1*  CL  --   < > 95*  CO2  --   < > 28  GLUCOSE  --   < > 150*  BUN  --   < > 37*  CREATININE  --   < > 2.94*  CALCIUM  --   < > 8.8*  MG 1.7  --   --   < > = values in this interval  not displayed.  Microbiology Results   Recent Results (from the past 240 hour(s))  MRSA PCR Screening     Status: None   Collection Time: 10/21/16  3:14 AM  Result Value Ref Range Status   MRSA by PCR NEGATIVE NEGATIVE Final    Comment:        The GeneXpert MRSA Assay (FDA approved for NASAL specimens only), is one component of a comprehensive MRSA colonization surveillance program. It is not intended to diagnose MRSA infection nor to guide or monitor treatment for MRSA infections.     RADIOLOGY:  Dg Knee 1-2 Views Right  Result Date: 10/22/2016 CLINICAL DATA:  Right knee pain without known injury. EXAM: RIGHT KNEE - 1-2 VIEW COMPARISON:  None. FINDINGS: No evidence of fracture, dislocation, or joint effusion. Mild narrowing of medial joint space is noted. Soft tissues are unremarkable. IMPRESSION: Mild degenerative joint disease. No acute abnormality seen in the right knee. Electronically Signed   By: Marijo Conception, M.D.   On: 10/22/2016 10:27     Management plans discussed with the patient, family and they are in agreement.  CODE STATUS:     Code Status Orders        Start     Ordered   10/20/16 2328  Full code  Continuous     10/20/16 2327    Code Status History    Date Active Date Inactive Code Status Order ID Comments User Context   07/05/2016  6:43 PM 07/08/2016  2:08 AM Full Code [id number]  Demetrios Loll, MD Inpatient   06/20/2016  8:00 PM 06/21/2016  8:46 PM Full Code [id number]  Idelle Crouch, MD Inpatient   05/25/2016  5:45 PM 05/26/2016  9:52 PM Full Code [id number]  Flora Lipps, MD ED   04/01/2016  9:52 AM 04/02/2016  5:34 PM Full Code [id number]  Hillary Bow, MD ED   01/14/2016  7:59 PM 01/15/2016  8:39 PM Full  Code [id number]  Loletha Grayer, MD ED   12/02/2015  1:10 AM 12/08/2015  7:22 PM Full Code [id number]  Lance Coon, MD ED   11/14/2015 12:52 AM 11/17/2015  1:12 PM Full Code [id number]  Holley Raring, NP ED   11/14/2015 12:06 AM 11/14/2015 12:12 AM Full  Code [id number]  Varughese, Azucena Cecil, NP ED      TOTAL TIME TAKING CARE OF THIS PATIENT: *40* minutes.    Rhonna Holster M.D on 10/23/2016 at 8:14 AM  Between 7am to 6pm - Pager - [phone] After 6pm go to [email or web address] - password [email or web address] [place]  Office  [phone]  CC: Primary care physician; Theotis Burrow, MD

## 2016-10-23 NOTE — Discharge Instructions (Signed)
°  Anemia inespecfica (Anemia, Nonspecific) La anemia es una enfermedad en la que la concentracin de glbulos rojos o el nivel de hemoglobina en la sangre estn por debajo de lo normal. La hemoglobina es la sustancia de los glbulos rojos que lleva el oxgeno a todo el cuerpo. La anemia da como resultado que los tejidos no reciban la cantidad suficiente de oxgeno. CAUSAS Las causas ms frecuentes de anemia son:  Jamie Keller. El sangrado puede ser interno o externo. Incluye sangrado excesivo debido al perodo (en las mujeres) o por los intestinos.  Dficit nutricional.  Enfermedad renal, tiroidea o heptica crnicas.  Enfermedades de la mdula sea que disminuyen la produccin de glbulos rojos.  Cncer y tratamientos para Science writer.  VIH, sida y sus tratamientos.  Trastornos del bazo que aumentan la destruccin de glbulos rojos.  Enfermedades de [employer].  Destruccin excesiva de glbulos rojos debido a una infeccin, a medicamentos y a Nurse, mental health. SIGNOS Y SNTOMAS  Debilidad leve.  Mareos.  Dolor de [place].  Palpitaciones.  Falta de aire, especialmente con el ejercicio.  Palidez.  Sensibilidad al fro.  Indigestin.  Nuseas.  Dificultad para dormir.  Dificultad para concentrarse. Los sntomas pueden ocurrir repentinamente o pueden Psychologist, forensic. DIAGNSTICO Con frecuencia es necesario realizar anlisis de [employer]. Estos ayudan al profesional a Adult nurse. Su mdico controlar la materia fecal para Hydrographic surveyor la presencia de [place] y buscar otras causas de prdida de [place]. TRATAMIENTO El tratamiento vara segn la causa de la anemia. Las opciones de tratamiento son:  Suplementos de hierro, vitamina Z61, o cido flico.  Medicamentos con hormonas.  Transfusin de [place]. Ser necesaria en los casos de prdida de [place] grave.  Hospitalizacin. Ser necesaria si la prdida de sangre es  continua y significativa.  Cambios en la dieta.  Extirpacin del bazo. INSTRUCCIONES PARA EL CUIDADO EN EL HOGAR Cumpla con todas las visitas de control. Generalmente demora varias semanas corregir la anemia, y es muy importante que el mdico controle su enfermedad y su respuesta al [place]. SOLICITE ATENCIN MDICA DE INMEDIATO SI:  Siente debilidad extrema, falta de aire o dolor en el pecho.  Se siente mareado o tiene dificultad para concentrarse.  Tiene una hemorragia vaginal abundante.  Aparece una erupcin cutnea.  La materia fecal es negra, de aspecto alquitranado.  Se desmaya.  Vomita sangre.  Vomita repetidas veces.  Siente dolor abdominal.  Tiene fiebre o sntomas persistentes durante ms de 2 - 3 das.  Tiene fiebre y los sntomas empeoran repentinamente.  Se deshidrata.  ASEGRESE DE QUE:  Comprende estas instrucciones.  Controlar su afeccin.  Recibir ayuda de inmediato si no mejora o si empeora.  Esta informacin no tiene Marine scientist el consejo del mdico. Asegrese de hacerle al mdico cualquier pregunta que tenga. Document Released: 02/22/2005 Document Revised: 10/25/2012 Document Reviewed: 08/18/2012 Elsevier Interactive Patient Education  2017 [employer] your HD as before

## 2016-10-24 LAB — BPAM RBC
BLOOD PRODUCT EXPIRATION DATE: 201809042359
BLOOD PRODUCT EXPIRATION DATE: 201809122359
BLOOD PRODUCT EXPIRATION DATE: 201809132359
Blood Product Expiration Date: 201809132359
ISSUE DATE / TIME: 201808162054
ISSUE DATE / TIME: 201808160840
ISSUE DATE / TIME: 201808171815
UNIT TYPE AND RH: 5100
UNIT TYPE AND RH: 5100
UNIT TYPE AND RH: 9500
Unit Type and Rh: 5100

## 2016-10-24 LAB — TYPE AND SCREEN
ABO/RH(D): B POS
ANTIBODY SCREEN: NEGATIVE
UNIT DIVISION: 0
UNIT DIVISION: 0
UNIT DIVISION: 0
Unit division: 0

## 2016-10-25 NOTE — Progress Notes (Signed)
Jamie Keller  [phone]) [phone] Fax:(336) [phone]  ID: [name] OB: [birth year]  MR#: [id number]  TKZ#:[id number]  [name], MD as PCP - General (Family Medicine)  CHIEF COMPLAINT: Multiple Myeloma in relapse with multiple bony lytic lesions, now with end-stage renal disease on dialysis.   INTERVAL HISTORY: Patient returns to clinic today for further evaluation, hospital follow-up, and consideration of blood transfusion. She continues to feel weak and fatigued, but this is improved since her discharge. She continues to have bilateral knee pain, but otherwise her pain is well-controlled. She is anxious about having not started treatment yet. She denies any fever or chills. She has a fair appetite, but her weight has remained stable.  She has dyspnea on exertion, but denies cough, hemoptysis, or chest pain. She complains of mild nausea and occasional vomiting. Patient offers no further specific complaints today.   REVIEW OF SYSTEMS:   Review of Systems  Constitutional: Positive for malaise/fatigue. Negative for chills, fever and weight loss.  HENT: Negative for congestion.   Respiratory: Positive for shortness of breath. Negative for cough.   Cardiovascular: Negative.  Negative for chest pain and leg swelling.  Gastrointestinal: Negative for abdominal pain, blood in stool, constipation, diarrhea, nausea and vomiting.  Genitourinary: Negative for hematuria.       On dialysis  Musculoskeletal: Positive for back pain and myalgias.  Skin: Negative for itching and rash.  Neurological: Positive for tingling, sensory change and weakness. Negative for focal weakness and headaches.  Psychiatric/Behavioral: Negative for hallucinations. The patient is nervous/anxious and has insomnia.    As per HPI. Otherwise, a complete review of systems is negative.   PAST MEDICAL HISTORY: Past Medical History:  Diagnosis Date  . Anemia     . Anxiety   . Chronic kidney disease    DIALYSIS  . Depression   . Diabetes mellitus without complication ([place])   . Dialysis patient ([place])    Mon, Wed, Fri  . History of chemotherapy    chemo on Tuesday and Wednesday  . Hypertension   . Multiple myeloma ([place])   . Neuropathy associated with cancer ([place])   . Osteoarthritis   . Pancreatitis   . Pneumonia    September 2017, [facility]  . Post-menopausal 2014  . Sepsis ([place])     PAST SURGICAL HISTORY: Past Surgical History:  Procedure Laterality Date  . AV FISTULA PLACEMENT Left 02/20/2016   Procedure: INSERTION OF ARTERIOVENOUS (AV) GORE-TEX GRAFT ARM;  Surgeon: Katha Cabal, MD;  Location: [facility] ORS;  Service: Vascular;  Laterality: Left;  . CESAREAN SECTION     x2  . dialysis catheter placement    . PERIPHERAL VASCULAR CATHETERIZATION N/A 11/19/2015   Procedure: Dialysis/Perma Catheter Insertion;  Surgeon: Algernon Huxley, MD;  Location: [place] CV LAB;  Service: Cardiovascular;  Laterality: N/A;  . PERIPHERAL VASCULAR CATHETERIZATION Left 03/10/2016   Procedure: Thrombectomy;  Surgeon: Katha Cabal, MD;  Location: [place] CV LAB;  Service: Cardiovascular;  Laterality: Left;  . PORTACATH PLACEMENT    . TUBAL LIGATION      FAMILY HISTORY: Reviewed and unchanged. No reported history of malignancy or chronic disease.     ADVANCED DIRECTIVES:    HEALTH MAINTENANCE: Social History  Substance Use Topics  . Smoking status: Never Smoker  . Smokeless tobacco: Never Used  . Alcohol use No     No Known Allergies  Current Outpatient Prescriptions  Medication Sig Dispense Refill  . acetaminophen (TYLENOL)  500 MG tablet Take 500 mg by mouth every 6 (six) hours as needed.    . citalopram (CELEXA) 20 MG tablet Take 1 tablet (20 mg total) by mouth daily. 30 tablet 5  . HYDROcodone-acetaminophen (NORCO) 5-325 MG tablet Take 1-2 tablets by mouth every 6 (six) hours as needed for moderate pain or severe pain. 50 tablet 0   . Insulin Detemir (LEVEMIR FLEXPEN) 100 UNIT/ML Pen Inject 5 Units into the skin daily at 10 pm.    . VERAPAMIL HCL ER PO Take 120 mg by mouth daily.    Marland Kitchen dronabinol (MARINOL) 2.5 MG capsule Take 1 capsule (2.5 mg total) by mouth 2 (two) times daily before a meal. (Patient not taking: Reported on 03/16/2016) 60 capsule 0  . ferrous sulfate 325 (65 FE) MG tablet Take 325 mg by mouth daily with breakfast.    . furosemide (LASIX) 80 MG tablet Take 1 tablet (80 mg total) by mouth daily. (Patient not taking: Reported on 03/16/2016) 30 tablet 0  . promethazine (PHENERGAN) 25 MG tablet Take 25 mg by mouth every 6 (six) hours as needed for nausea or vomiting.     No current facility-administered medications for this visit.    Facility-Administered Medications Ordered in Other Visits  Medication Dose Route Frequency Provider Last Rate Last Dose  . heparin lock flush 100 unit/mL  500 Units Intravenous Once Lloyd Huger, MD      . sodium chloride flush (NS) 0.9 % injection 10 mL  10 mL Intravenous Once Lloyd Huger, MD        Vitals:   10/26/16 0942  BP: (!) 148/74  Pulse: 80  Resp: 18  Temp: (!) 96.9 F (36.1 C)   ECOG FS:1 - Symptomatic  General: No acute distress. Eyes: anicteric sclera. Lungs: Clear to auscultation bilaterally. Heart: Regular rate and rhythm. No rubs, murmurs, or gallops. Abdomen: Soft, nontender, nondistended. No organomegaly noted, normoactive bowel sounds. Musculoskeletal: No edema, cyanosis, or clubbing. Neuro: Alert, answering all questions appropriately. Cranial nerves grossly intact. Skin: No rashes or petechiae noted.  Psych: Normal affect.   LAB RESULTS:  CBC    Component Value Date/Time   WBC 2.2 (L) 10/26/2016 0909   RBC 2.73 (L) 10/26/2016 0909   HGB 8.5 (L) 10/26/2016 0909   HGB 11.7 (L) 06/27/2014 1344   HCT 24.2 (L) 10/26/2016 0909   HCT 34.0 (L) 06/27/2014 1344   PLT 57 (L) 10/26/2016 0909   PLT 267 06/27/2014 1344   MCV 88.4  10/26/2016 0909   MCV 88 06/27/2014 1344   MCH 31.1 10/26/2016 0909   MCHC 35.1 10/26/2016 0909   RDW 15.9 (H) 10/26/2016 0909   RDW 13.4 06/27/2014 1344   LYMPHSABS 0.4 (L) 10/26/2016 0909   LYMPHSABS 0.9 (L) 06/27/2014 1344   MONOABS 0.5 10/26/2016 0909   MONOABS 0.9 06/27/2014 1344   EOSABS 0.0 10/26/2016 0909   EOSABS 0.2 06/27/2014 1344   BASOSABS 0.0 10/26/2016 0909   BASOSABS 0.1 06/27/2014 1344   BMET    Component Value Date/Time   NA 134 (L) 10/26/2016 0909   NA 137 06/27/2014 1344   K 4.2 10/26/2016 0909   K 4.0 06/27/2014 1344   CL 93 (L) 10/26/2016 0909   CL 106 06/27/2014 1344   CO2 29 10/26/2016 0909   CO2 26 06/27/2014 1344   GLUCOSE 190 (H) 10/26/2016 0909   GLUCOSE 83 06/27/2014 1344   BUN 52 (H) 10/26/2016 0909   BUN 27 (H) 06/27/2014 1344  CREATININE 4.49 (H) 10/26/2016 0909   CREATININE 1.36 (H) 06/27/2014 1344   CALCIUM 9.1 10/26/2016 0909   CALCIUM 9.1 06/27/2014 1344   GFRNONAA 10 (L) 10/26/2016 0909   GFRNONAA 46 (L) 06/27/2014 1344   GFRAA 12 (L) 10/26/2016 0909   GFRAA 53 (L) 06/27/2014 1344   Lab Results  Component Value Date   TOTALPROTELP 6.3 09/21/2016   ALBUMINELP 3.4 09/21/2016   A1GS 0.3 09/21/2016   A2GS 1.1 (H) 09/21/2016   BETS 1.1 09/21/2016   GAMS 0.3 (L) 09/21/2016   MSPIKE 0.3 (H) 09/21/2016   SPEI Comment 09/21/2016        STUDIES: No results found.  ASSESSMENT: Multiple Myeloma in relapse with multiple bony lytic lesions, end-stage renal disease.   PLAN:    1. Multiple Myeloma in relapse with multiple bony lytic lesions: PET scan results reviewed independently with significant progression of disease. Also, her kappa free light chains have increased significantly to greater then 9800. Daratumumab has been discontinued and will proceed with oral Ixazomib 3 mg on days 1, 8, and 15 with day 22 off. Revlimid has been discontinued secondary to severe pancytopenia. Will not give dexamethasone given her poorly controlled  diabetes. Hold Xgeva given her history of hypocalcemia. Because of patient's immigration status, she could not undergo transplant. Return to clinic in 2 weeks for further evaluation and to assess her toleration of treatment. 2. Pain: Continue fentanyl patch and oxycodone.  3. End-stage renal disease: Likely secondary to progression of disease. Continue dialysis on Monday, Wednesdays and Fridays. Has fistula. 4. Anemia: Decreased, but improved since discharge. Patient does not require transfusion at this time. Continue Procrit at dialysis as needed. Return to clinic in 1 week for repeat laboratory work and possible transfusion if necessary. 5. Hyperglycemia: Patient's blood glucose has improved since initiating insulin. Continue monitoring and treatment by primary care. No dexamethasone as above. 6. Thrombocytopenia: Improving, likely secondary to Revlimid. 7. Depression: Continue Celexa 20 mg daily.  8. Constipation: Continue OTC medications if needed. Monitor.  9. Hypocalcemia: Improved. Patient does not require 2 g of IV calcium gluconate today. Case previously discussed with nephrology and they will attempt to increase her calcium with dialysis. No Xgeva as above. 10. Prognosis: We previously discussed the possibility of hospice and end-of-life care, but patient is not interested in pursuing this at this time and wishes to continue with aggressive treatment. She did acknowledge that her treatment options are limited and end-of-life care we will likely need to be discussed again in the future.  Interpreter present during entire clinic visit.  Patient expressed understanding and was in agreement with this plan. She also understands that She can call clinic at any time with any questions, concerns, or complaints.    Lloyd Huger, MD 10/27/16 8:55 AM

## 2016-10-26 ENCOUNTER — Other Ambulatory Visit: Payer: Self-pay

## 2016-10-26 ENCOUNTER — Inpatient Hospital Stay (HOSPITAL_BASED_OUTPATIENT_CLINIC_OR_DEPARTMENT_OTHER): Payer: Self-pay | Admitting: Oncology

## 2016-10-26 ENCOUNTER — Telehealth: Payer: Self-pay | Admitting: Oncology

## 2016-10-26 ENCOUNTER — Inpatient Hospital Stay: Payer: Self-pay | Attending: Oncology

## 2016-10-26 ENCOUNTER — Inpatient Hospital Stay: Payer: Self-pay

## 2016-10-26 VITALS — BP 148/74 | HR 80 | Temp 96.9°F | Resp 18

## 2016-10-26 DIAGNOSIS — K5900 Constipation, unspecified: Secondary | ICD-10-CM | POA: Insufficient documentation

## 2016-10-26 DIAGNOSIS — M25561 Pain in right knee: Secondary | ICD-10-CM | POA: Insufficient documentation

## 2016-10-26 DIAGNOSIS — Z9221 Personal history of antineoplastic chemotherapy: Secondary | ICD-10-CM | POA: Insufficient documentation

## 2016-10-26 DIAGNOSIS — D696 Thrombocytopenia, unspecified: Secondary | ICD-10-CM | POA: Insufficient documentation

## 2016-10-26 DIAGNOSIS — R5383 Other fatigue: Secondary | ICD-10-CM | POA: Insufficient documentation

## 2016-10-26 DIAGNOSIS — Z794 Long term (current) use of insulin: Secondary | ICD-10-CM | POA: Insufficient documentation

## 2016-10-26 DIAGNOSIS — N186 End stage renal disease: Secondary | ICD-10-CM | POA: Insufficient documentation

## 2016-10-26 DIAGNOSIS — M25562 Pain in left knee: Secondary | ICD-10-CM

## 2016-10-26 DIAGNOSIS — R531 Weakness: Secondary | ICD-10-CM | POA: Insufficient documentation

## 2016-10-26 DIAGNOSIS — D649 Anemia, unspecified: Secondary | ICD-10-CM

## 2016-10-26 DIAGNOSIS — Z8619 Personal history of other infectious and parasitic diseases: Secondary | ICD-10-CM

## 2016-10-26 DIAGNOSIS — F329 Major depressive disorder, single episode, unspecified: Secondary | ICD-10-CM

## 2016-10-26 DIAGNOSIS — R0602 Shortness of breath: Secondary | ICD-10-CM

## 2016-10-26 DIAGNOSIS — E1165 Type 2 diabetes mellitus with hyperglycemia: Secondary | ICD-10-CM

## 2016-10-26 DIAGNOSIS — C9002 Multiple myeloma in relapse: Secondary | ICD-10-CM

## 2016-10-26 DIAGNOSIS — R112 Nausea with vomiting, unspecified: Secondary | ICD-10-CM | POA: Insufficient documentation

## 2016-10-26 DIAGNOSIS — I129 Hypertensive chronic kidney disease with stage 1 through stage 4 chronic kidney disease, or unspecified chronic kidney disease: Secondary | ICD-10-CM

## 2016-10-26 DIAGNOSIS — Z8719 Personal history of other diseases of the digestive system: Secondary | ICD-10-CM | POA: Insufficient documentation

## 2016-10-26 DIAGNOSIS — G629 Polyneuropathy, unspecified: Secondary | ICD-10-CM

## 2016-10-26 DIAGNOSIS — Z79899 Other long term (current) drug therapy: Secondary | ICD-10-CM

## 2016-10-26 DIAGNOSIS — Z992 Dependence on renal dialysis: Secondary | ICD-10-CM

## 2016-10-26 DIAGNOSIS — M1990 Unspecified osteoarthritis, unspecified site: Secondary | ICD-10-CM | POA: Insufficient documentation

## 2016-10-26 DIAGNOSIS — F419 Anxiety disorder, unspecified: Secondary | ICD-10-CM | POA: Insufficient documentation

## 2016-10-26 LAB — COMPREHENSIVE METABOLIC PANEL
ALBUMIN: 2.8 g/dL — AB (ref 3.5–5.0)
ALK PHOS: 260 U/L — AB (ref 38–126)
ALT: 17 U/L (ref 14–54)
ANION GAP: 12 (ref 5–15)
AST: 18 U/L (ref 15–41)
BILIRUBIN TOTAL: 0.9 mg/dL (ref 0.3–1.2)
BUN: 52 mg/dL — ABNORMAL HIGH (ref 6–20)
CALCIUM: 9.1 mg/dL (ref 8.9–10.3)
CO2: 29 mmol/L (ref 22–32)
Chloride: 93 mmol/L — ABNORMAL LOW (ref 101–111)
Creatinine, Ser: 4.49 mg/dL — ABNORMAL HIGH (ref 0.44–1.00)
GFR calc Af Amer: 12 mL/min — ABNORMAL LOW (ref >60)
GFR calc non Af Amer: 10 mL/min — ABNORMAL LOW (ref >60)
GLUCOSE: 190 mg/dL — AB (ref 65–99)
Potassium: 4.2 mmol/L (ref 3.5–5.1)
Sodium: 134 mmol/L — ABNORMAL LOW (ref 135–145)
TOTAL PROTEIN: 6.1 g/dL — AB (ref 6.5–8.1)

## 2016-10-26 LAB — CBC WITH DIFFERENTIAL/PLATELET
BASOS PCT: 1 %
Basophils Absolute: 0 10*3/uL (ref 0–0.1)
Eosinophils Absolute: 0 10*3/uL (ref 0–0.7)
Eosinophils Relative: 1 %
HEMATOCRIT: 24.2 % — AB (ref 35.0–47.0)
HEMOGLOBIN: 8.5 g/dL — AB (ref 12.0–16.0)
LYMPHS ABS: 0.4 10*3/uL — AB (ref 1.0–3.6)
LYMPHS PCT: 18 %
MCH: 31.1 pg (ref 26.0–34.0)
MCHC: 35.1 g/dL (ref 32.0–36.0)
MCV: 88.4 fL (ref 80.0–100.0)
MONOS PCT: 21 %
Monocytes Absolute: 0.5 10*3/uL (ref 0.2–0.9)
NEUTROS ABS: 1.3 10*3/uL — AB (ref 1.4–6.5)
NEUTROS PCT: 59 %
Platelets: 57 10*3/uL — ABNORMAL LOW (ref 150–440)
RBC: 2.73 MIL/uL — ABNORMAL LOW (ref 3.80–5.20)
RDW: 15.9 % — ABNORMAL HIGH (ref 11.5–14.5)
WBC: 2.2 10*3/uL — ABNORMAL LOW (ref 3.6–11.0)

## 2016-10-26 LAB — MAGNESIUM: Magnesium: 1.7 mg/dL (ref 1.7–2.4)

## 2016-10-26 LAB — SAMPLE TO BLOOD BANK

## 2016-10-26 MED ORDER — DICLOFENAC SODIUM 1 % TD GEL: 2.0000 g | Freq: Four times a day (QID) | TRANSDERMAL | 0 refills | Status: AC

## 2016-10-26 MED ORDER — FENTANYL 25 MCG/HR TD PT72: 25.0000 ug | MEDICATED_PATCH | TRANSDERMAL | 0 refills | Status: AC

## 2016-10-26 MED ORDER — OXYCODONE HCL 10 MG PO TABS: 10.0000 mg | ORAL_TABLET | ORAL | 0 refills | Status: AC | PRN

## 2016-10-26 NOTE — Progress Notes (Signed)
Patient is here today for follow up, she is doing ok, she has arthritis in her knee wanted to know can she have something stronger. Would like a refill on her patches for pain and oxycodone.

## 2016-10-26 NOTE — Telephone Encounter (Signed)
Spoke with translator and she told patient and her daughter that we need her tax return from last year in order to try to get her some assistance with her drug Ninlaro. Patient and daughter will work together to get the information and return to the office. Holding all paper work till  I get this information to send together.  [place] Patient Advocate 10/26/2016 10:20 AM

## 2016-11-02 ENCOUNTER — Other Ambulatory Visit: Payer: Self-pay | Admitting: Oncology

## 2016-11-02 ENCOUNTER — Other Ambulatory Visit: Payer: Self-pay | Admitting: *Deleted

## 2016-11-02 ENCOUNTER — Inpatient Hospital Stay: Payer: Self-pay

## 2016-11-02 DIAGNOSIS — D649 Anemia, unspecified: Secondary | ICD-10-CM

## 2016-11-02 DIAGNOSIS — C9002 Multiple myeloma in relapse: Secondary | ICD-10-CM

## 2016-11-02 LAB — CBC WITH DIFFERENTIAL/PLATELET
BASOS ABS: 0 10*3/uL (ref 0–0.1)
Basophils Relative: 1 %
EOS ABS: 0 10*3/uL (ref 0–0.7)
EOS PCT: 0 %
HCT: 20.5 % — ABNORMAL LOW (ref 35.0–47.0)
Hemoglobin: 7.2 g/dL — ABNORMAL LOW (ref 12.0–16.0)
LYMPHS PCT: 21 %
Lymphs Abs: 0.4 10*3/uL — ABNORMAL LOW (ref 1.0–3.6)
MCH: 30.9 pg (ref 26.0–34.0)
MCHC: 35.2 g/dL (ref 32.0–36.0)
MCV: 87.8 fL (ref 80.0–100.0)
MONO ABS: 0.4 10*3/uL (ref 0.2–0.9)
Monocytes Relative: 21 %
Neutro Abs: 1.1 10*3/uL — ABNORMAL LOW (ref 1.4–6.5)
Neutrophils Relative %: 57 %
PLATELETS: 111 10*3/uL — AB (ref 150–440)
RBC: 2.33 MIL/uL — ABNORMAL LOW (ref 3.80–5.20)
RDW: 15.3 % — AB (ref 11.5–14.5)
WBC: 2 10*3/uL — ABNORMAL LOW (ref 3.6–11.0)

## 2016-11-02 LAB — MAGNESIUM: MAGNESIUM: 1.9 mg/dL (ref 1.7–2.4)

## 2016-11-02 LAB — PREPARE RBC (CROSSMATCH)

## 2016-11-02 MED ORDER — SODIUM CHLORIDE 0.9 % IV SOLN
250.0000 mL | Freq: Once | INTRAVENOUS | Status: DC
  Filled 2016-11-02: qty 250

## 2016-11-02 MED ORDER — HEPARIN SOD (PORK) LOCK FLUSH 100 UNIT/ML IV SOLN: 500.0000 [IU] | Freq: Every day | INTRAVENOUS | Status: DC | PRN

## 2016-11-02 MED ORDER — ACETAMINOPHEN 325 MG PO TABS: 650.0000 mg | ORAL_TABLET | Freq: Once | ORAL | Status: DC

## 2016-11-02 MED ORDER — DIPHENHYDRAMINE HCL 50 MG/ML IJ SOLN: 25.0000 mg | Freq: Once | INTRAMUSCULAR | Status: DC

## 2016-11-02 NOTE — Progress Notes (Signed)
Patient did not receive blood transfusion today due to patient having antibodies, and blood bank will not have blood ready today. She is rescheduled to come back on Thursday for blood transfusion since has dialysis tomorrow. Dr. Grayland Ormond is aware. Explained to patient with interpreter present. She verbalized that she understands instructions.

## 2016-11-04 ENCOUNTER — Other Ambulatory Visit: Payer: Self-pay | Admitting: Oncology

## 2016-11-04 ENCOUNTER — Inpatient Hospital Stay: Payer: Self-pay

## 2016-11-04 DIAGNOSIS — C9002 Multiple myeloma in relapse: Secondary | ICD-10-CM

## 2016-11-04 LAB — SAMPLE TO BLOOD BANK

## 2016-11-04 MED ORDER — DIPHENHYDRAMINE HCL 50 MG/ML IJ SOLN
25.0000 mg | Freq: Once | INTRAMUSCULAR | Status: AC
  Administered 2016-11-04: 25 mg via INTRAVENOUS
  Filled 2016-11-04: qty 1

## 2016-11-04 MED ORDER — SODIUM CHLORIDE 0.9 % IV SOLN
250.0000 mL | Freq: Once | INTRAVENOUS | Status: AC
  Administered 2016-11-04: 250 mL via INTRAVENOUS
  Filled 2016-11-04: qty 250

## 2016-11-04 MED ORDER — SODIUM CHLORIDE 0.9% FLUSH
10.0000 mL | INTRAVENOUS | Status: DC | PRN
  Filled 2016-11-04: qty 10

## 2016-11-04 MED ORDER — ACETAMINOPHEN 325 MG PO TABS
650.0000 mg | ORAL_TABLET | Freq: Once | ORAL | Status: AC
  Administered 2016-11-04: 650 mg via ORAL
  Filled 2016-11-04: qty 2

## 2016-11-04 MED ORDER — HEPARIN SOD (PORK) LOCK FLUSH 100 UNIT/ML IV SOLN
500.0000 [IU] | Freq: Every day | INTRAVENOUS | Status: AC | PRN
  Administered 2016-11-04: 500 [IU]
  Filled 2016-11-04: qty 5

## 2016-11-05 LAB — TYPE AND SCREEN
ABO/RH(D): B POS
Antibody Screen: POSITIVE
Unit division: 0

## 2016-11-05 LAB — BPAM RBC
Blood Product Expiration Date: 201809252359
ISSUE DATE / TIME: 201808300957
Unit Type and Rh: 5100

## 2016-11-05 NOTE — Progress Notes (Deleted)
[place]  [phone] [phone]  ID: [name] OB: May 06, 1965  MR#: [id number]  AJO#:[id number]  [name], MD as PCP - General (Family Medicine)  CHIEF COMPLAINT: Multiple Myeloma in relapse with multiple bony lytic lesions, now with end-stage renal disease on dialysis.   INTERVAL HISTORY: Patient returns to clinic today for further evaluation, hospital follow-up, and consideration of blood transfusion. She continues to feel weak and fatigued, but this is improved since her discharge. She continues to have bilateral knee pain, but otherwise her pain is well-controlled. She is anxious about having not started treatment yet. She denies any fever or chills. She has a fair appetite, but her weight has remained stable.  She has dyspnea on exertion, but denies cough, hemoptysis, or chest pain. She complains of mild nausea and occasional vomiting. Patient offers no further specific complaints today.   REVIEW OF SYSTEMS:   Review of Systems  Constitutional: Positive for malaise/fatigue. Negative for chills, fever and weight loss.  HENT: Negative for congestion.   Respiratory: Positive for shortness of breath. Negative for cough.   Cardiovascular: Negative.  Negative for chest pain and leg swelling.  Gastrointestinal: Negative for abdominal pain, blood in stool, constipation, diarrhea, nausea and vomiting.  Genitourinary: Negative for hematuria.       On dialysis  Musculoskeletal: Positive for back pain and myalgias.  Skin: Negative for itching and rash.  Neurological: Positive for tingling, sensory change and weakness. Negative for focal weakness and headaches.  Psychiatric/Behavioral: Negative for hallucinations. The patient is nervous/anxious and has insomnia.    As per HPI. Otherwise, a complete review of systems is negative.   PAST MEDICAL HISTORY: Past Medical History:  Diagnosis Date  . Anemia     . Anxiety   . Chronic kidney disease    DIALYSIS  . Depression   . Diabetes mellitus without complication ([place])   . Dialysis patient ([place])    Mon, Wed, Fri  . History of chemotherapy    chemo on Tuesday and Wednesday  . Hypertension   . Multiple myeloma ([place])   . Neuropathy associated with cancer ([place])   . Osteoarthritis   . Pancreatitis   . Pneumonia    September 2017, [facility]  . Post-menopausal 2014  . Sepsis ([place])     PAST SURGICAL HISTORY: Past Surgical History:  Procedure Laterality Date  . AV FISTULA PLACEMENT Left 02/20/2016   Procedure: INSERTION OF ARTERIOVENOUS (AV) GORE-TEX GRAFT ARM;  Surgeon: Katha Cabal, MD;  Location: [facility] ORS;  Service: Vascular;  Laterality: Left;  . CESAREAN SECTION     x2  . dialysis catheter placement    . PERIPHERAL VASCULAR CATHETERIZATION N/A 11/19/2015   Procedure: Dialysis/Perma Catheter Insertion;  Surgeon: Algernon Huxley, MD;  Location: [place] [facility];  Service: Cardiovascular;  Laterality: N/A;  . PERIPHERAL VASCULAR CATHETERIZATION Left 03/10/2016   Procedure: Thrombectomy;  Surgeon: Katha Cabal, MD;  Location: [place] [facility];  Service: Cardiovascular;  Laterality: Left;  . PORTACATH PLACEMENT    . TUBAL LIGATION      FAMILY HISTORY: Reviewed and unchanged. No reported history of malignancy or chronic disease.     ADVANCED DIRECTIVES:    HEALTH MAINTENANCE: Social History  Substance Use Topics  . Smoking status: Never Smoker  . Smokeless tobacco: Never Used  . Alcohol use No     No Known Allergies  Current Outpatient Prescriptions  Medication Sig Dispense Refill  . acetaminophen (TYLENOL)  500 MG tablet Take 500 mg by mouth every 6 (six) hours as needed.    . citalopram (CELEXA) 20 MG tablet Take 1 tablet (20 mg total) by mouth daily. 30 tablet 5  . HYDROcodone-acetaminophen (NORCO) 5-325 MG tablet Take 1-2 tablets by mouth every 6 (six) hours as needed for moderate pain or severe pain. 50 tablet 0   . Insulin Detemir (LEVEMIR FLEXPEN) 100 UNIT/ML Pen Inject 5 Units into the skin daily at 10 pm.    . VERAPAMIL HCL ER PO Take 120 mg by mouth daily.    Marland Kitchen dronabinol (MARINOL) 2.5 MG capsule Take 1 capsule (2.5 mg total) by mouth 2 (two) times daily before a meal. (Patient not taking: Reported on 03/16/2016) 60 capsule 0  . ferrous sulfate 325 (65 FE) MG tablet Take 325 mg by mouth daily with breakfast.    . furosemide (LASIX) 80 MG tablet Take 1 tablet (80 mg total) by mouth daily. (Patient not taking: Reported on 03/16/2016) 30 tablet 0  . promethazine (PHENERGAN) 25 MG tablet Take 25 mg by mouth every 6 (six) hours as needed for nausea or vomiting.     No current facility-administered medications for this visit.    Facility-Administered Medications Ordered in Other Visits  Medication Dose Route Frequency Provider Last Rate Last Dose  . heparin lock flush 100 unit/mL  500 Units Intravenous Once Lloyd Huger, MD      . sodium chloride flush (NS) 0.9 % injection 10 mL  10 mL Intravenous Once Lloyd Huger, MD        There were no vitals filed for this visit. ECOG FS:1 - Symptomatic  General: No acute distress. Eyes: anicteric sclera. Lungs: Clear to auscultation bilaterally. Heart: Regular rate and rhythm. No rubs, murmurs, or gallops. Abdomen: Soft, nontender, nondistended. No organomegaly noted, normoactive bowel sounds. Musculoskeletal: No edema, cyanosis, or clubbing. Neuro: Alert, answering all questions appropriately. Cranial nerves grossly intact. Skin: No rashes or petechiae noted.  Psych: Normal affect.   LAB RESULTS:  CBC    Component Value Date/Time   WBC 2.0 (L) 11/02/2016 0852   RBC 2.33 (L) 11/02/2016 0852   HGB 7.2 (L) 11/02/2016 0852   HGB 11.7 (L) 06/27/2014 1344   HCT 20.5 (L) 11/02/2016 0852   HCT 34.0 (L) 06/27/2014 1344   PLT 111 (L) 11/02/2016 0852   PLT 267 06/27/2014 1344   MCV 87.8 11/02/2016 0852   MCV 88 06/27/2014 1344   MCH 30.9  11/02/2016 0852   MCHC 35.2 11/02/2016 0852   RDW 15.3 (H) 11/02/2016 0852   RDW 13.4 06/27/2014 1344   LYMPHSABS 0.4 (L) 11/02/2016 0852   LYMPHSABS 0.9 (L) 06/27/2014 1344   MONOABS 0.4 11/02/2016 0852   MONOABS 0.9 06/27/2014 1344   EOSABS 0.0 11/02/2016 0852   EOSABS 0.2 06/27/2014 1344   BASOSABS 0.0 11/02/2016 0852   BASOSABS 0.1 06/27/2014 1344   BMET    Component Value Date/Time   NA 134 (L) 10/26/2016 0909   NA 137 06/27/2014 1344   K 4.2 10/26/2016 0909   K 4.0 06/27/2014 1344   CL 93 (L) 10/26/2016 0909   CL 106 06/27/2014 1344   CO2 29 10/26/2016 0909   CO2 26 06/27/2014 1344   GLUCOSE 190 (H) 10/26/2016 0909   GLUCOSE 83 06/27/2014 1344   BUN 52 (H) 10/26/2016 0909   BUN 27 (H) 06/27/2014 1344   CREATININE 4.49 (H) 10/26/2016 0909   CREATININE 1.36 (H) 06/27/2014 1344  CALCIUM 9.1 10/26/2016 0909   CALCIUM 9.1 06/27/2014 1344   GFRNONAA 10 (L) 10/26/2016 0909   GFRNONAA 46 (L) 06/27/2014 1344   GFRAA 12 (L) 10/26/2016 0909   GFRAA 53 (L) 06/27/2014 1344   Lab Results  Component Value Date   TOTALPROTELP 6.3 09/21/2016   ALBUMINELP 3.4 09/21/2016   A1GS 0.3 09/21/2016   A2GS 1.1 (H) 09/21/2016   BETS 1.1 09/21/2016   GAMS 0.3 (L) 09/21/2016   MSPIKE 0.3 (H) 09/21/2016   SPEI Comment 09/21/2016        STUDIES: No results found.  ASSESSMENT: Multiple Myeloma in relapse with multiple bony lytic lesions, end-stage renal disease.   PLAN:    1. Multiple Myeloma in relapse with multiple bony lytic lesions: PET scan results reviewed independently with significant progression of disease. Also, her kappa free light chains have increased significantly to greater then 9800. Daratumumab has been discontinued and will proceed with oral Ixazomib 3 mg on days 1, 8, and 15 with day 22 off. Revlimid has been discontinued secondary to severe pancytopenia. Will not give dexamethasone given her poorly controlled diabetes. Hold Xgeva given her history of  hypocalcemia. Because of patient's immigration status, she could not undergo transplant. Return to clinic in 2 weeks for further evaluation and to assess her toleration of treatment. 2. Pain: Continue fentanyl patch and oxycodone.  3. End-stage renal disease: Likely secondary to progression of disease. Continue dialysis on Monday, Wednesdays and Fridays. Has fistula. 4. Anemia: Decreased, but improved since discharge. Patient does not require transfusion at this time. Continue Procrit at dialysis as needed. Return to clinic in 1 week for repeat laboratory work and possible transfusion if necessary. 5. Hyperglycemia: Patient's blood glucose has improved since initiating insulin. Continue monitoring and treatment by primary care. No dexamethasone as above. 6. Thrombocytopenia: Improving, likely secondary to Revlimid. 7. Depression: Continue Celexa 20 mg daily.  8. Constipation: Continue OTC medications if needed. Monitor.  9. Hypocalcemia: Improved. Patient does not require 2 g of IV calcium gluconate today. Case previously discussed with nephrology and they will attempt to increase her calcium with dialysis. No Xgeva as above. 10. Prognosis: We previously discussed the possibility of hospice and end-of-life care, but patient is not interested in pursuing this at this time and wishes to continue with aggressive treatment. She did acknowledge that her treatment options are limited and end-of-life care we will likely need to be discussed again in the future.  Interpreter present during entire clinic visit.  Patient expressed understanding and was in agreement with this plan. She also understands that She can call clinic at any time with any questions, concerns, or complaints.    Lloyd Huger, MD 11/05/16 1:10 PM

## 2016-11-08 ENCOUNTER — Encounter: Payer: Self-pay | Admitting: Emergency Medicine

## 2016-11-08 ENCOUNTER — Emergency Department: Payer: Medicaid Other

## 2016-11-08 ENCOUNTER — Inpatient Hospital Stay
Admission: EM | Admit: 2016-11-08 | Discharge: 2016-12-06 | DRG: 981 | Disposition: E | Payer: Medicaid Other | Attending: Internal Medicine | Admitting: Internal Medicine

## 2016-11-08 DIAGNOSIS — R040 Epistaxis: Secondary | ICD-10-CM | POA: Diagnosis not present

## 2016-11-08 DIAGNOSIS — D61818 Other pancytopenia: Secondary | ICD-10-CM | POA: Diagnosis present

## 2016-11-08 DIAGNOSIS — E1140 Type 2 diabetes mellitus with diabetic neuropathy, unspecified: Secondary | ICD-10-CM | POA: Diagnosis present

## 2016-11-08 DIAGNOSIS — Z9221 Personal history of antineoplastic chemotherapy: Secondary | ICD-10-CM | POA: Diagnosis not present

## 2016-11-08 DIAGNOSIS — Z794 Long term (current) use of insulin: Secondary | ICD-10-CM

## 2016-11-08 DIAGNOSIS — E1122 Type 2 diabetes mellitus with diabetic chronic kidney disease: Secondary | ICD-10-CM | POA: Diagnosis present

## 2016-11-08 DIAGNOSIS — G40901 Epilepsy, unspecified, not intractable, with status epilepticus: Secondary | ICD-10-CM | POA: Diagnosis present

## 2016-11-08 DIAGNOSIS — I6783 Posterior reversible encephalopathy syndrome: Secondary | ICD-10-CM | POA: Diagnosis present

## 2016-11-08 DIAGNOSIS — I161 Hypertensive emergency: Secondary | ICD-10-CM | POA: Diagnosis present

## 2016-11-08 DIAGNOSIS — Z992 Dependence on renal dialysis: Secondary | ICD-10-CM | POA: Diagnosis not present

## 2016-11-08 DIAGNOSIS — Y828 Other medical devices associated with adverse incidents: Secondary | ICD-10-CM | POA: Diagnosis not present

## 2016-11-08 DIAGNOSIS — M25562 Pain in left knee: Secondary | ICD-10-CM

## 2016-11-08 DIAGNOSIS — I132 Hypertensive heart and chronic kidney disease with heart failure and with stage 5 chronic kidney disease, or end stage renal disease: Secondary | ICD-10-CM | POA: Diagnosis present

## 2016-11-08 DIAGNOSIS — E876 Hypokalemia: Secondary | ICD-10-CM | POA: Diagnosis not present

## 2016-11-08 DIAGNOSIS — D709 Neutropenia, unspecified: Secondary | ICD-10-CM | POA: Diagnosis not present

## 2016-11-08 DIAGNOSIS — N2581 Secondary hyperparathyroidism of renal origin: Secondary | ICD-10-CM | POA: Diagnosis present

## 2016-11-08 DIAGNOSIS — R5081 Fever presenting with conditions classified elsewhere: Secondary | ICD-10-CM | POA: Diagnosis not present

## 2016-11-08 DIAGNOSIS — Z8249 Family history of ischemic heart disease and other diseases of the circulatory system: Secondary | ICD-10-CM

## 2016-11-08 DIAGNOSIS — Z8349 Family history of other endocrine, nutritional and metabolic diseases: Secondary | ICD-10-CM

## 2016-11-08 DIAGNOSIS — N186 End stage renal disease: Secondary | ICD-10-CM

## 2016-11-08 DIAGNOSIS — T82858A Stenosis of vascular prosthetic devices, implants and grafts, initial encounter: Secondary | ICD-10-CM | POA: Diagnosis not present

## 2016-11-08 DIAGNOSIS — Z66 Do not resuscitate: Secondary | ICD-10-CM | POA: Diagnosis not present

## 2016-11-08 DIAGNOSIS — C9002 Multiple myeloma in relapse: Secondary | ICD-10-CM | POA: Diagnosis present

## 2016-11-08 DIAGNOSIS — D631 Anemia in chronic kidney disease: Secondary | ICD-10-CM | POA: Diagnosis present

## 2016-11-08 DIAGNOSIS — G9340 Encephalopathy, unspecified: Secondary | ICD-10-CM | POA: Diagnosis present

## 2016-11-08 DIAGNOSIS — I509 Heart failure, unspecified: Secondary | ICD-10-CM | POA: Diagnosis present

## 2016-11-08 DIAGNOSIS — Z7189 Other specified counseling: Secondary | ICD-10-CM

## 2016-11-08 DIAGNOSIS — M1990 Unspecified osteoarthritis, unspecified site: Secondary | ICD-10-CM | POA: Diagnosis present

## 2016-11-08 DIAGNOSIS — I160 Hypertensive urgency: Secondary | ICD-10-CM | POA: Diagnosis present

## 2016-11-08 DIAGNOSIS — M25561 Pain in right knee: Secondary | ICD-10-CM

## 2016-11-08 DIAGNOSIS — J9601 Acute respiratory failure with hypoxia: Secondary | ICD-10-CM | POA: Diagnosis not present

## 2016-11-08 DIAGNOSIS — Z515 Encounter for palliative care: Secondary | ICD-10-CM | POA: Diagnosis not present

## 2016-11-08 DIAGNOSIS — R0602 Shortness of breath: Secondary | ICD-10-CM

## 2016-11-08 DIAGNOSIS — R062 Wheezing: Secondary | ICD-10-CM

## 2016-11-08 LAB — CBC
HCT: 27.3 % — ABNORMAL LOW (ref 35.0–47.0)
HEMOGLOBIN: 9.5 g/dL — AB (ref 12.0–16.0)
MCH: 30.5 pg (ref 26.0–34.0)
MCHC: 34.9 g/dL (ref 32.0–36.0)
MCV: 87.4 fL (ref 80.0–100.0)
Platelets: 106 10*3/uL — ABNORMAL LOW (ref 150–440)
RBC: 3.13 MIL/uL — ABNORMAL LOW (ref 3.80–5.20)
RDW: 15 % — ABNORMAL HIGH (ref 11.5–14.5)
WBC: 6.1 10*3/uL (ref 3.6–11.0)

## 2016-11-08 LAB — CBC WITH DIFFERENTIAL/PLATELET
Basophils Absolute: 0.1 10*3/uL (ref 0–0.1)
Basophils Relative: 1 %
EOS PCT: 0 %
Eosinophils Absolute: 0 10*3/uL (ref 0–0.7)
HCT: 29.2 % — ABNORMAL LOW (ref 35.0–47.0)
Hemoglobin: 10.2 g/dL — ABNORMAL LOW (ref 12.0–16.0)
LYMPHS ABS: 1.1 10*3/uL (ref 1.0–3.6)
LYMPHS PCT: 17 %
MCH: 30.8 pg (ref 26.0–34.0)
MCHC: 35.1 g/dL (ref 32.0–36.0)
MCV: 87.7 fL (ref 80.0–100.0)
MONO ABS: 1.3 10*3/uL — AB (ref 0.2–0.9)
MONOS PCT: 20 %
NEUTROS ABS: 4.1 10*3/uL (ref 1.4–6.5)
NEUTROS PCT: 62 %
PLATELETS: 112 10*3/uL — AB (ref 150–440)
RBC: 3.33 MIL/uL — AB (ref 3.80–5.20)
RDW: 14.6 % — ABNORMAL HIGH (ref 11.5–14.5)
WBC: 6.6 10*3/uL (ref 3.6–11.0)

## 2016-11-08 LAB — BASIC METABOLIC PANEL
ANION GAP: 18 — AB (ref 5–15)
Anion gap: 15 (ref 5–15)
BUN: 52 mg/dL — AB (ref 6–20)
BUN: 54 mg/dL — ABNORMAL HIGH (ref 6–20)
CALCIUM: 11.9 mg/dL — AB (ref 8.9–10.3)
CHLORIDE: 92 mmol/L — AB (ref 101–111)
CO2: 24 mmol/L (ref 22–32)
CO2: 26 mmol/L (ref 22–32)
CREATININE: 6.46 mg/dL — AB (ref 0.44–1.00)
Calcium: 11.9 mg/dL — ABNORMAL HIGH (ref 8.9–10.3)
Chloride: 92 mmol/L — ABNORMAL LOW (ref 101–111)
Creatinine, Ser: 6.46 mg/dL — ABNORMAL HIGH (ref 0.44–1.00)
GFR calc Af Amer: 8 mL/min — ABNORMAL LOW (ref >60)
GFR calc non Af Amer: 7 mL/min — ABNORMAL LOW (ref >60)
GFR calc non Af Amer: 7 mL/min — ABNORMAL LOW (ref >60)
GFR, EST AFRICAN AMERICAN: 8 mL/min — AB (ref >60)
Glucose, Bld: 234 mg/dL — ABNORMAL HIGH (ref 65–99)
Glucose, Bld: 268 mg/dL — ABNORMAL HIGH (ref 65–99)
POTASSIUM: 3.7 mmol/L (ref 3.5–5.1)
Potassium: 3.7 mmol/L (ref 3.5–5.1)
SODIUM: 134 mmol/L — AB (ref 135–145)
SODIUM: 133 mmol/L — AB (ref 135–145)

## 2016-11-08 LAB — HEPATIC FUNCTION PANEL
ALBUMIN: 3.1 g/dL — AB (ref 3.5–5.0)
ALK PHOS: 143 U/L — AB (ref 38–126)
ALT: 9 U/L — AB (ref 14–54)
AST: 27 U/L (ref 15–41)
BILIRUBIN INDIRECT: 1 mg/dL — AB (ref 0.3–0.9)
Bilirubin, Direct: 0.2 mg/dL (ref 0.1–0.5)
TOTAL PROTEIN: 6.7 g/dL (ref 6.5–8.1)
Total Bilirubin: 1.2 mg/dL (ref 0.3–1.2)

## 2016-11-08 LAB — PHOSPHORUS
Phosphorus: 5.8 mg/dL — ABNORMAL HIGH (ref 2.5–4.6)
Phosphorus: 2.2 mg/dL — ABNORMAL LOW (ref 2.5–4.6)

## 2016-11-08 LAB — ETHANOL: Alcohol, Ethyl (B): <5 mg/dL (ref <5)

## 2016-11-08 LAB — TROPONIN I: Troponin I: 0.04 ng/mL (ref <0.03)

## 2016-11-08 LAB — MAGNESIUM: Magnesium: 2.4 mg/dL (ref 1.7–2.4)

## 2016-11-08 LAB — GLUCOSE, CAPILLARY
GLUCOSE-CAPILLARY: 279 mg/dL — AB (ref 65–99)
GLUCOSE-CAPILLARY: 118 mg/dL — AB (ref 65–99)
GLUCOSE-CAPILLARY: 124 mg/dL — AB (ref 65–99)
Glucose-Capillary: 121 mg/dL — ABNORMAL HIGH (ref 65–99)
Glucose-Capillary: 143 mg/dL — ABNORMAL HIGH (ref 65–99)
Glucose-Capillary: 97 mg/dL (ref 65–99)

## 2016-11-08 LAB — TSH: TSH: 0.988 u[IU]/mL (ref 0.350–4.500)

## 2016-11-08 LAB — MRSA PCR SCREENING: MRSA BY PCR: NEGATIVE

## 2016-11-08 LAB — AMMONIA: Ammonia: 34 umol/L (ref 9–35)

## 2016-11-08 LAB — PROCALCITONIN: Procalcitonin: 3.08 ng/mL

## 2016-11-08 MED ORDER — ORAL CARE MOUTH RINSE
15.0000 mL | Freq: Two times a day (BID) | OROMUCOSAL | Status: DC
  Administered 2016-11-08 – 2016-11-20 (×17): 15 mL via OROMUCOSAL

## 2016-11-08 MED ORDER — PROCHLORPERAZINE MALEATE 10 MG PO TABS
10.0000 mg | ORAL_TABLET | Freq: Four times a day (QID) | ORAL | Status: DC | PRN
Start: 2016-11-08 — End: 2016-11-21
  Administered 2016-11-16: 10 mg via ORAL
  Filled 2016-11-08 (×3): qty 1

## 2016-11-08 MED ORDER — LORAZEPAM 2 MG/ML IJ SOLN
INTRAMUSCULAR | Status: AC
  Filled 2016-11-08: qty 1

## 2016-11-08 MED ORDER — ACETAMINOPHEN 325 MG PO TABS
650.0000 mg | ORAL_TABLET | Freq: Four times a day (QID) | ORAL | Status: DC | PRN
  Administered 2016-11-14: 650 mg via ORAL
  Filled 2016-11-08: qty 2

## 2016-11-08 MED ORDER — ACETAMINOPHEN 650 MG RE SUPP
650.0000 mg | Freq: Four times a day (QID) | RECTAL | Status: DC | PRN
  Administered 2016-11-08: 650 mg via RECTAL
  Filled 2016-11-08: qty 1

## 2016-11-08 MED ORDER — ALTEPLASE 2 MG IJ SOLR: 2.0000 mg | Freq: Once | INTRAMUSCULAR | Status: DC | PRN

## 2016-11-08 MED ORDER — VERAPAMIL HCL ER 120 MG PO TBCR
120.0000 mg | EXTENDED_RELEASE_TABLET | Freq: Every day | ORAL | Status: DC
  Administered 2016-11-09 – 2016-11-14 (×6): 120 mg via ORAL
  Filled 2016-11-08 (×7): qty 1

## 2016-11-08 MED ORDER — HEPARIN SODIUM (PORCINE) 1000 UNIT/ML DIALYSIS
1000.0000 [IU] | INTRAMUSCULAR | Status: DC | PRN
  Filled 2016-11-08: qty 1

## 2016-11-08 MED ORDER — FENTANYL 25 MCG/HR TD PT72
25.0000 ug | MEDICATED_PATCH | TRANSDERMAL | Status: DC
  Administered 2016-11-08 – 2016-11-20 (×5): 25 ug via TRANSDERMAL
  Filled 2016-11-08 (×6): qty 1

## 2016-11-08 MED ORDER — LORAZEPAM 2 MG/ML IJ SOLN
INTRAMUSCULAR | Status: AC
Start: 2016-11-08 — End: 2016-11-08
  Administered 2016-11-08: 2 mg
  Filled 2016-11-08: qty 1

## 2016-11-08 MED ORDER — SODIUM CHLORIDE 0.9% FLUSH: 3.0000 mL | INTRAVENOUS | Status: DC | PRN

## 2016-11-08 MED ORDER — INSULIN ASPART 100 UNIT/ML ~~LOC~~ SOLN
0.0000 [IU] | SUBCUTANEOUS | Status: DC
  Administered 2016-11-08 – 2016-11-09 (×4): 2 [IU] via SUBCUTANEOUS
  Administered 2016-11-09: 3 [IU] via SUBCUTANEOUS
  Administered 2016-11-09: 5 [IU] via SUBCUTANEOUS
  Filled 2016-11-08 (×6): qty 1

## 2016-11-08 MED ORDER — ENOXAPARIN SODIUM 40 MG/0.4ML ~~LOC~~ SOLN: 40.0000 mg | SUBCUTANEOUS | Status: DC

## 2016-11-08 MED ORDER — OXYCODONE HCL 5 MG PO TABS: 10.0000 mg | ORAL_TABLET | ORAL | Status: DC | PRN

## 2016-11-08 MED ORDER — SENNA 8.6 MG PO TABS
1.0000 | ORAL_TABLET | Freq: Every day | ORAL | Status: DC
  Administered 2016-11-09 – 2016-11-17 (×8): 8.6 mg via ORAL
  Filled 2016-11-08 (×8): qty 1

## 2016-11-08 MED ORDER — LORAZEPAM 2 MG/ML IJ SOLN
2.0000 mg | INTRAMUSCULAR | Status: DC | PRN
Start: 2016-11-08 — End: 2016-11-22
  Administered 2016-11-12 – 2016-11-20 (×3): 2 mg via INTRAVENOUS
  Filled 2016-11-08 (×3): qty 1

## 2016-11-08 MED ORDER — LIDOCAINE-PRILOCAINE 2.5-2.5 % EX CREA
1.0000 "application " | TOPICAL_CREAM | CUTANEOUS | Status: DC | PRN
  Filled 2016-11-08: qty 5

## 2016-11-08 MED ORDER — ONDANSETRON HCL 4 MG PO TABS: 4.0000 mg | ORAL_TABLET | Freq: Four times a day (QID) | ORAL | Status: DC | PRN

## 2016-11-08 MED ORDER — SODIUM CHLORIDE 0.9 % IV SOLN: 100.0000 mL | INTRAVENOUS | Status: DC | PRN

## 2016-11-08 MED ORDER — SODIUM CHLORIDE 0.9 % IV SOLN
1000.0000 mg | Freq: Once | INTRAVENOUS | Status: AC
  Administered 2016-11-08: 1000 mg via INTRAVENOUS
  Filled 2016-11-08: qty 10

## 2016-11-08 MED ORDER — PENTAFLUOROPROP-TETRAFLUOROETH EX AERO
1.0000 "application " | INHALATION_SPRAY | CUTANEOUS | Status: DC | PRN
  Filled 2016-11-08: qty 30

## 2016-11-08 MED ORDER — HEPARIN SODIUM (PORCINE) 5000 UNIT/ML IJ SOLN
5000.0000 [IU] | Freq: Two times a day (BID) | INTRAMUSCULAR | Status: DC
  Administered 2016-11-08 – 2016-11-15 (×15): 5000 [IU] via SUBCUTANEOUS
  Filled 2016-11-08 (×15): qty 1

## 2016-11-08 MED ORDER — ASPIRIN EC 81 MG PO TBEC
81.0000 mg | DELAYED_RELEASE_TABLET | Freq: Every day | ORAL | Status: DC
  Administered 2016-11-09 – 2016-11-21 (×13): 81 mg via ORAL
  Filled 2016-11-08 (×13): qty 1

## 2016-11-08 MED ORDER — HEPARIN SOD (PORK) LOCK FLUSH 100 UNIT/ML IV SOLN
250.0000 [IU] | INTRAVENOUS | Status: DC | PRN
  Filled 2016-11-08: qty 3

## 2016-11-08 MED ORDER — METOPROLOL TARTRATE 5 MG/5ML IV SOLN
5.0000 mg | Freq: Once | INTRAVENOUS | Status: AC
  Administered 2016-11-08: 5 mg via INTRAVENOUS

## 2016-11-08 MED ORDER — NICARDIPINE HCL IN NACL 20-0.86 MG/200ML-% IV SOLN
0.0000 mg/h | INTRAVENOUS | Status: DC
  Administered 2016-11-08: 5 mg/h via INTRAVENOUS
  Filled 2016-11-08: qty 200

## 2016-11-08 MED ORDER — ORAL CARE MOUTH RINSE
15.0000 mL | Freq: Two times a day (BID) | OROMUCOSAL | Status: DC
  Administered 2016-11-08 – 2016-11-09 (×2): 15 mL via OROMUCOSAL

## 2016-11-08 MED ORDER — SODIUM CHLORIDE 0.9 % IV SOLN
500.0000 mg | Freq: Two times a day (BID) | INTRAVENOUS | Status: DC
  Administered 2016-11-08 – 2016-11-10 (×4): 500 mg via INTRAVENOUS
  Filled 2016-11-08 (×5): qty 5

## 2016-11-08 MED ORDER — SODIUM CHLORIDE 0.9 % IV SOLN
INTRAVENOUS | Status: DC
  Administered 2016-11-08 – 2016-11-11 (×5): via INTRAVENOUS

## 2016-11-08 MED ORDER — METOPROLOL TARTRATE 5 MG/5ML IV SOLN
5.0000 mg | Freq: Four times a day (QID) | INTRAVENOUS | Status: DC
  Administered 2016-11-08 – 2016-11-10 (×9): 5 mg via INTRAVENOUS
  Filled 2016-11-08 (×9): qty 5

## 2016-11-08 MED ORDER — SODIUM CHLORIDE 0.9% FLUSH: 10.0000 mL | INTRAVENOUS | Status: DC | PRN

## 2016-11-08 MED ORDER — NICARDIPINE HCL IN NACL 20-0.86 MG/200ML-% IV SOLN
3.0000 mg/h | INTRAVENOUS | Status: DC
  Administered 2016-11-08: 8 mg/h via INTRAVENOUS
  Administered 2016-11-08: 4 mg/h via INTRAVENOUS
  Administered 2016-11-08 – 2016-11-09 (×4): 6 mg/h via INTRAVENOUS
  Filled 2016-11-08 (×9): qty 200

## 2016-11-08 MED ORDER — LIDOCAINE HCL (PF) 1 % IJ SOLN
5.0000 mL | INTRAMUSCULAR | Status: DC | PRN
  Filled 2016-11-08: qty 5

## 2016-11-08 MED ORDER — ONDANSETRON HCL 4 MG/2ML IJ SOLN
4.0000 mg | Freq: Four times a day (QID) | INTRAMUSCULAR | Status: DC | PRN
  Administered 2016-11-11 – 2016-11-19 (×8): 4 mg via INTRAVENOUS
  Filled 2016-11-08 (×10): qty 2

## 2016-11-08 MED ORDER — LABETALOL HCL 5 MG/ML IV SOLN
INTRAVENOUS | Status: AC
  Administered 2016-11-08: 20 mg
  Filled 2016-11-08: qty 4

## 2016-11-08 MED ORDER — BISACODYL 10 MG RE SUPP
10.0000 mg | Freq: Once | RECTAL | Status: AC
  Administered 2016-11-08: 10 mg via RECTAL
  Filled 2016-11-08: qty 1

## 2016-11-08 NOTE — Progress Notes (Signed)
[place] Progress Note Patient Name: Jamie Keller DOB: Jan 29, 1966 MRN: [id number]   Date of Service  11/09/2016  HPI/Events of Note  Remains hypertensive, tachy on cardene gtt Not taking PO C/o constipation  eICU Interventions  Lopressor 5 q 6 Taper off ccardene Dulcolax supp     Intervention Category Major Interventions: Hypertension - evaluation and management  Lashonta Pilling V. 11/19/2016, 5:37 PM

## 2016-11-08 NOTE — Progress Notes (Signed)
[facility]  ASSESSMENT

## 2016-11-08 NOTE — Progress Notes (Signed)
[facility]  ROUNDING NOTE   Subjective:  Patient well known to [place]. We follow her as an outpatient for end-stage renal disease secondary to progressive multiple myeloma. She presents now with witnessed seizure. She is seen in the critical care unit. Currently unable to provide history. Blood pressure was quite high upon admission. She is currently on a nicardipine drip and blood pressure has come down a bit.  Objective:  Vital signs in last 24 hours:  Temp:  [96.6 F (35.9 C)-98.7 F (37.1 C)] 98.7 F (37.1 C) (09/03 0800) Pulse Rate:  [89-122] 110 (09/03 0800) Resp:  [17-26] 22 (09/03 0800) BP: (139-211)/(55-91) 151/55 (09/03 0800) SpO2:  [94 %-100 %] 96 % (09/03 0800) Weight:  [47.2 kg (104 lb)] 47.2 kg (104 lb) (09/03 0213)  Weight change:  Filed Weights   11/07/2016 0213  Weight: 47.2 kg (104 lb)    Intake/Output: I/O last 3 completed shifts: In: 364 [I.V.:264; IV Piggyback:100] Out: -    Intake/Output this shift:  Total I/O In: 321.3 [I.V.:321.3] Out: -   Physical Exam: General: Critically ill appearing   Head: Normocephalic, atraumatic. Moist oral mucosal membranes  Eyes: Anicteric  Neck: Supple, trachea midline  Lungs:  Clear to auscultation, normal effort  Heart: S1S2 no rubs  Abdomen:  Soft, nontender, bowel sounds present  Extremities: Trace peripheral edema.  Neurologic: Lethargic, not following commands  Skin: No lesions  Access: LUE AV access    Basic Metabolic Panel:  Recent Labs Lab 11/02/16 0852 12/02/2016 0157 11/22/2016 0423  NA  --  134* 133*  K  --  3.7 3.7  CL  --  92* 92*  CO2  --  24 26  GLUCOSE  --  234* 268*  BUN  --  52* 54*  CREATININE  --  6.46* 6.46*  CALCIUM  --  11.9* 11.9*  MG 1.9  --  2.4  PHOS  --   --  5.8*    Liver Function Tests:  Recent Labs Lab 11/19/2016 0157  AST 27  ALT 9*  ALKPHOS 143*  BILITOT 1.2  PROT 6.7  ALBUMIN 3.1*   No results for input(s): LIPASE, AMYLASE in the last 168  hours.  Recent Labs Lab 11/20/2016 0223  AMMONIA 34    CBC:  Recent Labs Lab 11/02/16 0852 11/15/2016 0157 11/22/2016 0423  WBC 2.0* 6.6 6.1  NEUTROABS 1.1* 4.1  --   HGB 7.2* 10.2* 9.5*  HCT 20.5* 29.2* 27.3*  MCV 87.8 87.7 87.4  PLT 111* 112* 106*    Cardiac Enzymes:  Recent Labs Lab 12/03/2016 0157  TROPONINI 0.04*    BNP: Invalid input(s): POCBNP  CBG:  Recent Labs Lab 11/06/2016 0324  GLUCAP 1*    Microbiology: Results for orders placed or performed during the hospital encounter of 11/16/2016  MRSA PCR Screening     Status: None   Collection Time: 11/29/2016  3:41 AM  Result Value Ref Range Status   MRSA by PCR NEGATIVE NEGATIVE Final    Comment:        The GeneXpert MRSA Assay (FDA approved for NASAL specimens only), is one component of a comprehensive MRSA colonization surveillance program. It is not intended to diagnose MRSA infection nor to guide or monitor treatment for MRSA infections.     Coagulation Studies: No results for input(s): LABPROT, INR in the last 72 hours.  Urinalysis: No results for input(s): COLORURINE, LABSPEC, PHURINE, GLUCOSEU, HGBUR, BILIRUBINUR, KETONESUR, PROTEINUR, UROBILINOGEN, NITRITE, LEUKOCYTESUR in the last 72  hours.  Invalid input(s): APPERANCEUR    Imaging: Ct Head Wo Contrast  Result Date: 11/28/2016 CLINICAL DATA:  Altered level of consciousness.  Seizure. EXAM: CT HEAD WITHOUT CONTRAST TECHNIQUE: Contiguous axial images were obtained from the base of the skull through the vertex without intravenous contrast. COMPARISON:  Brain MRI 04/30/2016, head CT 12/08/2015 FINDINGS: Brain: Symmetric low-density within bilateral posterior parietooccipital lobes. No evidence of acute hemorrhage. No hydrocephalus, midline shift or mass effect. No subdural or extra-axial fluid collection. Vascular:  No hyperdense vessel. Skull: Suggestion of diffuse sclerosis. Subcentimeter lytic lesion in the high right frontoparietal calvarium  is new from prior exam. Subcentimeter lytic lesion in the left frontal bone is also new. Unchanged lytic skullbase lesion. Smaller subcentimeter lesions in the left frontal bone are unchanged from previous. Sinuses/Orbits: Paranasal sinuses and mastoid air cells are clear. The visualized orbits are unremarkable. Other: None. IMPRESSION: 1. Symmetric low-density within posterior parietooccipital lobes suspicious for PRES (posterior reversible encephalopathy syndrome). Consider MRI for further evaluation. 2. Subcentimeter calvarial lesions in right and left frontal bone there are new from October 2017 head CT likely related to myeloma. Electronically Signed   By: Jeb Levering M.D.   On: 11/18/2016 02:25   Dg Chest Port 1 View  Result Date: 11/12/2016 CLINICAL DATA:  Cough.  Seizure-like activity. EXAM: PORTABLE CHEST 1 VIEW COMPARISON:  Chest radiograph 05/26/2016. FINDINGS: Tip of the right chest port in the distal SVC. Heart size upper normal. Mediastinal contours are normal. Improved bilateral pleural effusions from prior exam, suspect small volume residual or recurrent pleural fluid. Improved pulmonary edema from prior exam. Bony sclerosis with remote bilateral rib fractures, likely related to myeloma. Lytic lesion in the right clavicle. IMPRESSION: 1. Small pleural effusions, improved from prior exam, may be persistent or recurrent. Improved pulmonary edema, mild residual or recurrent vascular congestion. 2. Bony scleroses and bilateral rib fractures likely related to myeloma. Electronically Signed   By: Jeb Levering M.D.   On: 11/20/2016 02:36     Medications:   . sodium chloride 75 mL/hr at 11/07/2016 0758  . levETIRAcetam    . niCARDipine 8 mg/hr (11/30/2016 0758)   . aspirin EC  81 mg Oral Daily  . fentaNYL  25 mcg Transdermal Q72H  . heparin subcutaneous  5,000 Units Subcutaneous Q12H  . insulin aspart  0-15 Units Subcutaneous Q4H  . LORazepam      . mouth rinse  15 mL Mouth Rinse BID   . mouth rinse  15 mL Mouth Rinse BID  . senna  1 tablet Oral Daily  . verapamil  120 mg Oral Daily   acetaminophen **OR** acetaminophen, LORazepam, ondansetron **OR** ondansetron (ZOFRAN) IV, oxyCODONE, prochlorperazine  Assessment/ Plan:  51 y.o. female  with diabetes mellitus type II, hypertension, multiple myeloma, ESRD on hemodialysis.   [facility] MWF [address] [facility]  1. End Stage Renal Disease: ESRD secondary to multiple myeloma - Patient due for hemodialysis today. Orders have been prepared for this.  2. Anemia of chronic kidney disease: Hemoglobin currently 9.5. Hold off on Epogen at this time given seizures.  3. Hypertension with posterior reversible encephalopathy syndrome. Agree with neurology consultation. Blood pressure was quite high upon presentation. Continue nicardipine drip for now.  4. Secondary Hyperparathyroidism:  Calcium high at 11.9 and secondary to her underlying extensive multiple myeloma. Check serum phosphorus with dialysis today.  5. Overall prognosis guarded.   LOS: 0 Alrick Cubbage 9/3/20188:55 AM

## 2016-11-08 NOTE — ED Notes (Signed)
Margarita Grizzle, RN informed of assigned bed

## 2016-11-08 NOTE — H&P (Signed)
[facility] [place] NAME: Jamie Keller    MR#:  [id number]  DATE OF BIRTH:  Sep 30, 1965  DATE OF ADMISSION:  12/02/2016  PRIMARY CARE PHYSICIAN: Alene Mires [name], MD   REQUESTING/REFERRING PHYSICIAN:   CHIEF COMPLAINT:   Chief Complaint  Patient presents with  . Seizures    HISTORY OF PRESENT ILLNESS: Jamie Keller  is a 51 y.o. female with a known history of anemia, end-stage renal disease on dialysis, multiple myeloma, diabetes mellitus, neuropathy, osteoarthritis presented to the emergency room from home, after family witnessed seizure. Patient had 2 episodes of seizures at home. She also had a vomiting. EMS was called for the seizures and blood pressure was noted to be elevated around systolic to 798 mmHg. Patient was given IV Ativan for seizures. Patient is sedated and lethargic and unable to give any history. She gets dialyzed on Monday Wednesday and Friday. No history of any seizures in the past.In the emergency room, patient's blood pressure was high and she was started on IV nicardipine drip.  PAST MEDICAL HISTORY:   Past Medical History:  Diagnosis Date  . Anemia   . Anxiety   . Chronic kidney disease    DIALYSIS  . Depression   . Diabetes mellitus without complication ([place])   . Dialysis patient ([place])    Mon, Wed, Fri  . History of chemotherapy    chemo on Tuesday and Wednesday  . Hypertension   . Multiple myeloma ([place])   . Neuropathy associated with cancer ([place])   . Osteoarthritis   . Pancreatitis   . Pneumonia    September 2017, [facility]  . Post-menopausal 2014  . Sepsis ([place])     PAST SURGICAL HISTORY: Past Surgical History:  Procedure Laterality Date  . A/V SHUNT INTERVENTION Left 07/07/2016   Procedure: A/V Shunt Intervention;  Surgeon: Algernon Huxley, MD;  Location: [facility];  Service: Cardiovascular;  Laterality: Left;  . AV FISTULA PLACEMENT Left 02/20/2016   Procedure:  INSERTION OF ARTERIOVENOUS (AV) GORE-TEX GRAFT ARM;  Surgeon: Katha Cabal, MD;  Location: [facility];  Service: Vascular;  Laterality: Left;  . CESAREAN SECTION     x2  . dialysis catheter placement    . PERIPHERAL VASCULAR CATHETERIZATION N/A 11/19/2015   Procedure: Dialysis/Perma Catheter Insertion;  Surgeon: Algernon Huxley, MD;  Location: [facility];  Service: Cardiovascular;  Laterality: N/A;  . PERIPHERAL VASCULAR CATHETERIZATION Left 03/10/2016   Procedure: Thrombectomy;  Surgeon: Katha Cabal, MD;  Location: [facility];  Service: Cardiovascular;  Laterality: Left;  . PERIPHERAL VASCULAR CATHETERIZATION N/A 03/31/2016   Procedure: Dialysis/Perma Catheter Removal;  Surgeon: Katha Cabal, MD;  Location: [facility];  Service: Cardiovascular;  Laterality: N/A;  . PORTACATH PLACEMENT    . TUBAL LIGATION      SOCIAL HISTORY:  Social History  Substance Use Topics  . Smoking status: Never Smoker  . Smokeless tobacco: Never Used  . Alcohol use No    FAMILY HISTORY:  Family History  Problem Relation Age of Onset  . Hypercholesterolemia Mother   . Hypertension Mother   . Hypertension Father     DRUG ALLERGIES: No Known Allergies  REVIEW OF SYSTEMS:  Unable to obtain secondary to sedation from ativan for seizure MEDICATIONS AT HOME:  Prior to Admission medications   Medication Sig Start Date End Date Taking? Authorizing Provider  diclofenac sodium (VOLTAREN) 1 % GEL Apply 2  g topically 4 (four) times daily. 10/26/16   Verlon Au, NP  fentaNYL (DURAGESIC - DOSED MCG/HR) 25 MCG/HR patch Place 1 patch (25 mcg total) onto the skin every 3 (three) days. 10/26/16   Lloyd Huger, MD  ferric citrate (AURYXIA) 1 GM 210 MG(Fe) tablet Take 2 tablets (420 mg total) by mouth 3 (three) times daily with meals. 10/23/16   Fritzi Mandes, MD  Insulin Detemir (LEVEMIR FLEXPEN) 100 UNIT/ML Pen Inject 5 Units into the skin daily at 10 pm.    [provider]  Insulin Pen Needle (NOVOFINE) 30G X 8 MM MISC Inject 10 each into the skin as needed. 04/02/16   Hillary Bow, MD  ondansetron (ZOFRAN) 8 MG tablet Take by mouth every 8 (eight) hours as needed for nausea or vomiting.    [provider]  Oxycodone HCl 10 MG TABS Take 1 tablet (10 mg total) by mouth every 4 (four) hours as needed (take 1 tablet by mouth every 4 - 6 hours as needed for severe pain). 10/26/16   Lloyd Huger, MD  prochlorperazine (COMPAZINE) 10 MG tablet Take 1 tablet (10 mg total) by mouth every 6 (six) hours as needed for nausea or vomiting. 09/07/16   Lloyd Huger, MD  senna (SENOKOT) 8.6 MG tablet Take 1 tablet by mouth daily.    [provider]  verapamil (CALAN-SR) 120 MG CR tablet Take 120 mg by mouth daily.    [provider]      PHYSICAL EXAMINATION:   VITAL SIGNS: Blood pressure (!) 154/63, pulse 95, temperature 97.7 F (36.5 C), temperature source Axillary, resp. rate 18, height 5' (1.524 m), weight 47.2 kg (104 lb), last menstrual period 02/25/1995, SpO2 97 %.  GENERAL:  51 y.o.-year-old patient lying in the bed on oxygen via nasal canula EYES: Pupils equal, round, reactive to light and accommodation. No scleral icterus. Extraocular muscles intact.  HEENT: Head atraumatic, normocephalic. Oropharynx dry and nasopharynx clear.  NECK:  Supple, no jugular venous distention. No thyroid enlargement, no tenderness.  LUNGS: Normal breath sounds bilaterally, no wheezing, rales,rhonchi or crepitation. No use of accessory muscles of respiration.  CARDIOVASCULAR: S1, S2 normal. No murmurs, rubs, or gallops.  ABDOMEN: Soft, nontender, nondistended. Bowel sounds present. No organomegaly or mass.  EXTREMITIES: No pedal edema, cyanosis, or clubbing.  NEUROLOGIC: Arousable to painful stimuli Moves all extremities Not oriented to time , place and person. PSYCHIATRIC: could not be assessed SKIN: No obvious rash, lesion, or  ulcer.   LABORATORY PANEL:   CBC  Recent Labs Lab 11/02/16 0852 12/02/2016 0157  WBC 2.0* 6.6  HGB 7.2* 10.2*  HCT 20.5* 29.2*  PLT 111* 112*  MCV 87.8 87.7  MCH 30.9 30.8  MCHC 35.2 35.1  RDW 15.3* 14.6*  LYMPHSABS 0.4* 1.1  MONOABS 0.4 1.3*  EOSABS 0.0 0.0  BASOSABS 0.0 0.1   ------------------------------------------------------------------------------------------------------------------  Chemistries   Recent Labs Lab 11/02/16 0852 11/07/2016 0157  NA  --  134*  K  --  3.7  CL  --  92*  CO2  --  24  GLUCOSE  --  234*  BUN  --  52*  CREATININE  --  6.46*  CALCIUM  --  11.9*  MG 1.9  --   AST  --  27  ALT  --  9*  ALKPHOS  --  143*  BILITOT  --  1.2   ------------------------------------------------------------------------------------------------------------------ estimated creatinine clearance is 7.3 mL/min (A) (by C-G formula based on  SCr of 6.46 mg/dL (H)). ------------------------------------------------------------------------------------------------------------------  Recent Labs  12/05/2016 0157  TSH 0.988     Coagulation profile No results for input(s): INR, PROTIME in the last 168 hours. ------------------------------------------------------------------------------------------------------------------- No results for input(s): DDIMER in the last 72 hours. -------------------------------------------------------------------------------------------------------------------  Cardiac Enzymes  Recent Labs Lab 11/19/2016 0157  TROPONINI 0.04*   ------------------------------------------------------------------------------------------------------------------ Invalid input(s): POCBNP  ---------------------------------------------------------------------------------------------------------------  Urinalysis    Component Value Date/Time   COLORURINE STRAW (A) 10/21/2016 0538   APPEARANCEUR HAZY (A) 10/21/2016 0538   APPEARANCEUR Clear 08/06/2013  2345   LABSPEC 1.004 (L) 10/21/2016 0538   LABSPEC 1.010 08/06/2013 2345   PHURINE 8.0 10/21/2016 0538   GLUCOSEU 150 (A) 10/21/2016 0538   GLUCOSEU 50 mg/dL 08/06/2013 2345   HGBUR SMALL (A) 10/21/2016 0538   BILIRUBINUR NEGATIVE 10/21/2016 0538   BILIRUBINUR Negative 08/06/2013 2345   [place] 10/21/2016 0538   PROTEINUR 30 (A) 10/21/2016 0538   NITRITE NEGATIVE 10/21/2016 0538   LEUKOCYTESUR MODERATE (A) 10/21/2016 0538   LEUKOCYTESUR Negative 08/06/2013 2345     RADIOLOGY: Ct Head Wo Contrast  Result Date: 11/29/2016 CLINICAL DATA:  Altered level of consciousness.  Seizure. EXAM: CT HEAD WITHOUT CONTRAST TECHNIQUE: Contiguous axial images were obtained from the base of the skull through the vertex without intravenous contrast. COMPARISON:  Brain MRI 04/30/2016, head CT 12/08/2015 FINDINGS: Brain: Symmetric low-density within bilateral posterior parietooccipital lobes. No evidence of acute hemorrhage. No hydrocephalus, midline shift or mass effect. No subdural or extra-axial fluid collection. Vascular:  No hyperdense vessel. Skull: Suggestion of diffuse sclerosis. Subcentimeter lytic lesion in the high right frontoparietal calvarium is new from prior exam. Subcentimeter lytic lesion in the left frontal bone is also new. Unchanged lytic skullbase lesion. Smaller subcentimeter lesions in the left frontal bone are unchanged from previous. Sinuses/Orbits: Paranasal sinuses and mastoid air cells are clear. The visualized orbits are unremarkable. Other: None. IMPRESSION: 1. Symmetric low-density within posterior parietooccipital lobes suspicious for PRES (posterior reversible encephalopathy syndrome). Consider MRI for further evaluation. 2. Subcentimeter calvarial lesions in right and left frontal bone there are new from October 2017 head CT likely related to myeloma. Electronically Signed   By: Jeb Levering M.D.   On: 11/25/2016 02:25   Dg Chest Port 1 View  Result Date:  11/07/2016 CLINICAL DATA:  Cough.  Seizure-like activity. EXAM: PORTABLE CHEST 1 VIEW COMPARISON:  Chest radiograph 05/26/2016. FINDINGS: Tip of the right chest port in the distal SVC. Heart size upper normal. Mediastinal contours are normal. Improved bilateral pleural effusions from prior exam, suspect small volume residual or recurrent pleural fluid. Improved pulmonary edema from prior exam. Bony sclerosis with remote bilateral rib fractures, likely related to myeloma. Lytic lesion in the right clavicle. IMPRESSION: 1. Small pleural effusions, improved from prior exam, may be persistent or recurrent. Improved pulmonary edema, mild residual or recurrent vascular congestion. 2. Bony scleroses and bilateral rib fractures likely related to myeloma. Electronically Signed   By: Jeb Levering M.D.   On: 11/11/2016 02:36    EKG: Orders placed or performed during the hospital encounter of 11/30/2016  . ED EKG  . ED EKG    IMPRESSION AND PLAN: 51 year old female patient with history of multiple myeloma, end-stage renal disease on dialysis, hypertension presented to the emergency room with seizure and elevated blood pressure.  Admitting diagnosis 1. Hypertensive urgency 2. Posterior reversible encephalopathy syndrome 3.New onset seizure 4. End-stage renal disease on dialysis Treatment plan Admit patient to stepdown unit Intensivist consultation Intensivist notified on-call IV  nicardipine drip for control of blood pressure IV when necessary Ativan for seizure Nephrology consult for dialysis EEG for seizures Neurology consultation DVT prophylaxis subcutaneous heparin  All the records are reviewed and case discussed with ED provider. Management plans discussed with the patient, family and they are in agreement.  CODE STATUS:FULL CODE    Code Status Orders        Start     Ordered   11/11/2016 0329  Full code  Continuous     12/04/2016 0328    Code Status History    Date Active Date  Inactive Code Status Order ID Comments User Context   10/20/2016 11:28 PM 10/23/2016  1:54 PM Full Code [id number]  HendersonUbaldo Glassing, DO Inpatient   07/05/2016  6:43 PM 07/08/2016  2:08 AM Full Code [id number]  Demetrios Loll, MD Inpatient   06/20/2016  8:00 PM 06/21/2016  8:46 PM Full Code [id number]  Idelle Crouch, MD Inpatient   05/25/2016  5:45 PM 05/26/2016  9:52 PM Full Code [id number]  Flora Lipps, MD ED   04/01/2016  9:52 AM 04/02/2016  5:34 PM Full Code [id number]  Hillary Bow, MD ED   01/14/2016  7:59 PM 01/15/2016  8:39 PM Full Code [id number]  Loletha Grayer, MD ED   12/02/2015  1:10 AM 12/08/2015  7:22 PM Full Code [id number]  Lance Coon, MD ED   11/14/2015 12:52 AM 11/17/2015  1:12 PM Full Code [id number]  Holley Raring, NP ED   11/14/2015 12:06 AM 11/14/2015 12:12 AM Full Code [id number]  Varughese, Azucena Cecil, NP ED       TOTAL CRITICAL CARE TIME TAKING CARE OF THIS PATIENT: 57 minutes.    Saundra Shelling M.D on 11/20/2016 at 3:38 AM  Between 7am to 6pm - Pager - [phone]  After 6pm go to [email or web address] - password [email or web address] [facility]  Office  [phone]  CC: Primary care physician; Theotis Burrow, MD

## 2016-11-08 NOTE — Progress Notes (Signed)
eLink Physician-Brief Progress Note Patient Name: Jamie Keller DOB: [birth year] MRN: [id number]   Date of Service  11/25/2016  HPI/Events of Note  51 yo female with PMH of ESRD Seizure. BP elevated in ED with SBP = 220. Now on Nicardipine IV infusion for BP control. PCCM asked to assume care in ICM. BP - 158/68, HR = 101, Sat = 95% and RR = 22 on admission to ICU.   eICU Interventions  No new orders.      Intervention Category Evaluation Type: New Patient Evaluation  Lysle Dingwall 11/07/2016, 3:45 AM

## 2016-11-08 NOTE — ED Notes (Signed)
Dr. Rifenbark at bedside 

## 2016-11-08 NOTE — ED Notes (Signed)
Pt to CT with Dr Mable Paris and Lorriane Shire, RN

## 2016-11-08 NOTE — ED Notes (Signed)
Nicardipine started by Wells Guiles, RN with this nurse verifying dose

## 2016-11-08 NOTE — Progress Notes (Signed)
HD STARTED  

## 2016-11-08 NOTE — Progress Notes (Signed)
[facility] [place] NAME: Jamie Keller    MR#:  [id number]  [place]:  [birth year]  SUBJECTIVE:  CHIEF COMPLAINT:   Chief Complaint  Patient presents with  . Seizures   The patient is unresponsive. On hemodialysis. REVIEW OF SYSTEMS:  Review of Systems  Unable to perform ROS: Mental status change    DRUG ALLERGIES:  No Known Allergies VITALS:  Blood pressure 140/66, pulse (!) 118, temperature 99 F (37.2 C), temperature source Axillary, resp. rate (!) 31, height 5' (1.524 m), weight 114 lb 10.2 oz (52 kg), last menstrual period 02/25/1995, SpO2 98 %. PHYSICAL EXAMINATION:  Physical Exam  Constitutional:  Unesponsive to verbal stimuli.  HENT:  Head: Normocephalic.  Mouth/Throat: Oropharynx is clear and moist.  Eyes: No scleral icterus.  Neck: Neck supple. No JVD present. No tracheal deviation present.  Cardiovascular: Normal rate, regular rhythm and normal heart sounds.  Exam reveals no gallop.   No murmur heard. Pulmonary/Chest: Effort normal and breath sounds normal. No respiratory distress. She has no wheezes. She has no rales.  Abdominal: Soft. Bowel sounds are normal. She exhibits no distension. There is no tenderness. There is no rebound.  Musculoskeletal: She exhibits no edema or tenderness.  Neurological: No cranial nerve deficit.  Unesponsive to verbal stimuli. Unable to exam.  Skin: No rash noted. No erythema.   LABORATORY PANEL:  Female CBC  Recent Labs Lab 11/15/2016 0423  WBC 6.1  HGB 9.5*  HCT 27.3*  PLT 106*   ------------------------------------------------------------------------------------------------------------------ Chemistries   Recent Labs Lab 11/11/2016 0157 11/25/2016 0423  NA 134* 133*  K 3.7 3.7  CL 92* 92*  CO2 24 26  GLUCOSE 234* 268*  BUN 52* 54*  CREATININE 6.46* 6.46*  CALCIUM 11.9* 11.9*  MG  --  2.4  AST 27  --   ALT 9*  --   ALKPHOS 143*  --   BILITOT 1.2  --     RADIOLOGY:  Ct Head Wo Contrast  Result Date: 12/04/2016 CLINICAL DATA:  Altered level of consciousness.  Seizure. EXAM: CT HEAD WITHOUT CONTRAST TECHNIQUE: Contiguous axial images were obtained from the base of the skull through the vertex without intravenous contrast. COMPARISON:  Brain MRI 04/30/2016, head CT 12/08/2015 FINDINGS: Brain: Symmetric low-density within bilateral posterior parietooccipital lobes. No evidence of acute hemorrhage. No hydrocephalus, midline shift or mass effect. No subdural or extra-axial fluid collection. Vascular:  No hyperdense vessel. Skull: Suggestion of diffuse sclerosis. Subcentimeter lytic lesion in the high right frontoparietal calvarium is new from prior exam. Subcentimeter lytic lesion in the left frontal bone is also new. Unchanged lytic skullbase lesion. Smaller subcentimeter lesions in the left frontal bone are unchanged from previous. Sinuses/Orbits: Paranasal sinuses and mastoid air cells are clear. The visualized orbits are unremarkable. Other: None. IMPRESSION: 1. Symmetric low-density within posterior parietooccipital lobes suspicious for PRES (posterior reversible encephalopathy syndrome). Consider MRI for further evaluation. 2. Subcentimeter calvarial lesions in right and left frontal bone there are new from October 2017 head CT likely related to myeloma. Electronically Signed   By: Jeb Levering M.D.   On: 11/16/2016 02:25   Dg Chest Port 1 View  Result Date: 11/15/2016 CLINICAL DATA:  Cough.  Seizure-like activity. EXAM: PORTABLE CHEST 1 VIEW COMPARISON:  Chest radiograph 05/26/2016. FINDINGS: Tip of the right chest port in the distal SVC. Heart size upper normal. Mediastinal contours are normal. Improved bilateral pleural effusions from prior exam, suspect small volume residual  or recurrent pleural fluid. Improved pulmonary edema from prior exam. Bony sclerosis with remote bilateral rib fractures, likely related to myeloma. Lytic lesion in the right  clavicle. IMPRESSION: 1. Small pleural effusions, improved from prior exam, may be persistent or recurrent. Improved pulmonary edema, mild residual or recurrent vascular congestion. 2. Bony scleroses and bilateral rib fractures likely related to myeloma. Electronically Signed   By: Jeb Levering M.D.   On: 12/01/2016 02:36   ASSESSMENT AND PLAN:   IMPRESSION AND PLAN: 51 year old female patient with history of multiple myeloma, end-stage renal disease on dialysis, hypertension presented to the emergency room with seizure and elevated blood pressure.   1. Hypertensive urgency 2. Posterior reversible encephalopathy syndrome 3.New onset seizure 4. End-stage renal disease on dialysis  Treatment plan Try to wean off IV nicardipine drip. Continue Keppra IV, IV when necessary Ativan for seizure, seizure precaution and follow-up neurology consult. Continue hemodialysis.  All the records are reviewed and case discussed with Care Management/Social Worker. Management plans discussed with the patient, family and they are in agreement.  CODE STATUS: Full Code  TOTAL TIME TAKING CARE OF THIS PATIENT: 37 minutes.   More than 50% of the time was spent in counseling/coordination of care: YES  POSSIBLE D/C IN 3 DAYS, DEPENDING ON CLINICAL CONDITION.   Demetrios Loll M.D on 12/05/2016 at 2:57 PM  Between 7am to 6pm - Pager - [phone]  After 6pm go to [email or web address] - [facility]

## 2016-11-08 NOTE — ED Notes (Signed)
CRITICAL LAB: TROPONIN is 0.04, [employer], Dr. Mable Paris notified, orders received

## 2016-11-08 NOTE — ED Notes (Addendum)
After Ativan, pt's sats dropped to 82%; nasal trumpet placed by MD to left nostril; sats up to 100% on oxygen 3L

## 2016-11-08 NOTE — Consult Note (Signed)
PULMONARY / CRITICAL CARE MEDICINE   Name: Jamie Keller MRN: [id number] DOB: Dec 05, 1965    ADMISSION DATE:  11/29/2016   CONSULTATION DATE:  11/07/16  REFERRING MD:  Dr Estanislado Pandy  REASON: NEW ONSET SEIZURES  CHIEF COMPLAINT: AMS  HISTORY OF PRESENT ILLNESS:   His is a 51 year old female with a past medical history of end-stage renal disease on hemodialysis, presenting with hypertensive crisis and new onset seizures.EMS was called because patient had patient had a seizure-like activity. Per patient's husband, she had not been feeling well all day with episodes of nausea and vomiting. Upon arrival in the ED, patient was awake but confused. She had another seizure while in the ED and was given lorazepam.she is currently somnolent and unable to follow commands. She is maintaining her airway and her SPO2 is in the high 90s. Patient has no documented history of seizures. Her systolic blood pressure was in the 200s. CT head was negative for any acute bleed, but suggested PRES. She was started on a Cardene infusion and admitted to the ICU for further management  PAST MEDICAL HISTORY :  She  has a past medical history of Anemia; Anxiety; Chronic kidney disease; Depression; Diabetes mellitus without complication ([place]); Dialysis patient [facility]); History of chemotherapy; Hypertension; Multiple myeloma ([place]); Neuropathy associated with cancer ([place]); Osteoarthritis; Pancreatitis; Pneumonia; Post-menopausal (2014); and Sepsis ([place]).  PAST SURGICAL HISTORY: She  has a past surgical history that includes Cesarean section; Cardiac catheterization (N/A, 11/19/2015); Portacath placement; dialysis catheter placement; Tubal ligation; AV fistula placement (Left, 02/20/2016); Cardiac catheterization (Left, 03/10/2016); Cardiac catheterization (N/A, 03/31/2016); and A/V SHUNT INTERVENTION (Left, 07/07/2016).  No Known Allergies  Current Facility-Administered Medications on File Prior to Encounter  Medication  .  LORazepam (ATIVAN) injection 1 mg   Current Outpatient Prescriptions on File Prior to Encounter  Medication Sig  . diclofenac sodium (VOLTAREN) 1 % GEL Apply 2 g topically 4 (four) times daily.  . fentaNYL (DURAGESIC - DOSED MCG/HR) 25 MCG/HR patch Place 1 patch (25 mcg total) onto the skin every 3 (three) days.  . ferric citrate (AURYXIA) 1 GM 210 MG(Fe) tablet Take 2 tablets (420 mg total) by mouth 3 (three) times daily with meals.  . Insulin Detemir (LEVEMIR FLEXPEN) 100 UNIT/ML Pen Inject 5 Units into the skin daily at 10 pm.  . Insulin Pen Needle (NOVOFINE) 30G X 8 MM MISC Inject 10 each into the skin as needed.  . ondansetron (ZOFRAN) 8 MG tablet Take by mouth every 8 (eight) hours as needed for nausea or vomiting.  . Oxycodone HCl 10 MG TABS Take 1 tablet (10 mg total) by mouth every 4 (four) hours as needed (take 1 tablet by mouth every 4 - 6 hours as needed for severe pain).  . prochlorperazine (COMPAZINE) 10 MG tablet Take 1 tablet (10 mg total) by mouth every 6 (six) hours as needed for nausea or vomiting.  . senna (SENOKOT) 8.6 MG tablet Take 1 tablet by mouth daily.  . verapamil (CALAN-SR) 120 MG CR tablet Take 120 mg by mouth daily.    FAMILY HISTORY:  Her indicated that her mother is alive. She indicated that her father is alive.    SOCIAL HISTORY: She  reports that she has never smoked. She has never used smokeless tobacco. She reports that she does not drink alcohol or use drugs.  REVIEW OF SYSTEMS:   Unable to obtain as patient is currently sedated  SUBJECTIVE:   VITAL SIGNS: BP (!) 158/68 (BP Location: Right Arm)  Pulse 95   Temp (!) 96.6 F (35.9 C) (Axillary)   Resp 18   Ht 5' (1.524 m)   Wt 104 lb (47.2 kg)   LMP 02/25/1995 Comment: Tubal ligation 1996  SpO2 97%   BMI 20.31 kg/m   HEMODYNAMICS:    VENTILATOR SETTINGS:    INTAKE / OUTPUT: No intake/output data recorded.  PHYSICAL EXAMINATION: General:  On a daily ill-looking Neuro:  Somnolent,  unresponsive to voice, but withdraws to noxious  stimulus, corneal reflexes intact HEENT: PERRLA, oral mucosa dry Cardiovascular:  Tachycardic, S1, S2, no murmur, regurg or gallop, no edema Lungs:  Bilateral Breath sounds, no wheezing or rhonchi Abdomen:  Soft, non tender, non distended, normal bowel sounds Musculoskeletal:  Positive range of motion. No deformities Skin:  Warm and dry LABS:  BMET  Recent Labs Lab 12/02/2016 0157  NA 134*  K 3.7  CL 92*  CO2 24  BUN 52*  CREATININE 6.46*  GLUCOSE 234*    Electrolytes  Recent Labs Lab 11/02/16 0852 11/26/2016 0157  CALCIUM  --  11.9*  MG 1.9  --     CBC  Recent Labs Lab 11/02/16 0852 12/03/2016 0157  WBC 2.0* 6.6  HGB 7.2* 10.2*  HCT 20.5* 29.2*  PLT 111* 112*    Coag's No results for input(s): APTT, INR in the last 168 hours.  Sepsis Markers No results for input(s): LATICACIDVEN, PROCALCITON, O2SATVEN in the last 168 hours.  ABG No results for input(s): PHART, PCO2ART, PO2ART in the last 168 hours.  Liver Enzymes  Recent Labs Lab 11/20/2016 0157  AST 27  ALT 9*  ALKPHOS 143*  BILITOT 1.2  ALBUMIN 3.1*    Cardiac Enzymes  Recent Labs Lab 11/06/2016 0157  TROPONINI 0.04*    Glucose No results for input(s): GLUCAP in the last 168 hours.  Imaging Ct Head Wo Contrast  Result Date: 11/07/2016 CLINICAL DATA:  Altered level of consciousness.  Seizure. EXAM: CT HEAD WITHOUT CONTRAST TECHNIQUE: Contiguous axial images were obtained from the base of the skull through the vertex without intravenous contrast. COMPARISON:  Brain MRI 04/30/2016, head CT 12/08/2015 FINDINGS: Brain: Symmetric low-density within bilateral posterior parietooccipital lobes. No evidence of acute hemorrhage. No hydrocephalus, midline shift or mass effect. No subdural or extra-axial fluid collection. Vascular:  No hyperdense vessel. Skull: Suggestion of diffuse sclerosis. Subcentimeter lytic lesion in the high right frontoparietal  calvarium is new from prior exam. Subcentimeter lytic lesion in the left frontal bone is also new. Unchanged lytic skullbase lesion. Smaller subcentimeter lesions in the left frontal bone are unchanged from previous. Sinuses/Orbits: Paranasal sinuses and mastoid air cells are clear. The visualized orbits are unremarkable. Other: None. IMPRESSION: 1. Symmetric low-density within posterior parietooccipital lobes suspicious for PRES (posterior reversible encephalopathy syndrome). Consider MRI for further evaluation. 2. Subcentimeter calvarial lesions in right and left frontal bone there are new from October 2017 head CT likely related to myeloma. Electronically Signed   By: Jeb Levering M.D.   On: 12/01/2016 02:25   Dg Chest Port 1 View  Result Date: 11/28/2016 CLINICAL DATA:  Cough.  Seizure-like activity. EXAM: PORTABLE CHEST 1 VIEW COMPARISON:  Chest radiograph 05/26/2016. FINDINGS: Tip of the right chest port in the distal SVC. Heart size upper normal. Mediastinal contours are normal. Improved bilateral pleural effusions from prior exam, suspect small volume residual or recurrent pleural fluid. Improved pulmonary edema from prior exam. Bony sclerosis with remote bilateral rib fractures, likely related to myeloma. Lytic lesion in the right clavicle.  IMPRESSION: 1. Small pleural effusions, improved from prior exam, may be persistent or recurrent. Improved pulmonary edema, mild residual or recurrent vascular congestion. 2. Bony scleroses and bilateral rib fractures likely related to myeloma. Electronically Signed   By: Jeb Levering M.D.   On: 11/28/2016 02:36     STUDIES:  EEG  CULTURES: Urine cultures Blood culture  ANTIBIOTICS: none  SIGNIFICANT EVENTS: 11/07/2016: Admitted  LINES/TUBES: Peripheral IVs  DISCUSSION: 51 year old female presenting with new onset seizures,acute encephalopathy and hypertensive crisis  ASSESSMENT  New onset seizures-CT head negative for any acute bleed  or infarct. Acute encephalopathy secondary to seizures. Hypertensive emergency. PRES Rule out any infectious process-chest x-ray unremarkable, urine culture and blood cultures pending End-stage renal disease on hemodialysis. Type 2 diabetes mellitus. History of multiple myeloma. History of anemia  PLAN] Seizure precautions Keppra 1 g IV 1 and then 500 mg IV every 12 hours EEG Neurologic consult Follow-up blood and urine cultures to eliminate any infectious process. Hemodialysis per nephrology Sliding-scale insulin coverage with blood glucose monitoring every 4 hours Cardene infusion to maintain systolic blood pressure between 140 and 160 mmHg GI and DVT prophylaxis  The changes in treatment plan pending clinical course and diagnostics FAMILY  - Updates: no family at bedside. We will updated when available  - Inter-disciplinary family meet or Palliative Care meeting due by:  day [place] ANP-BC Pulmonary and Critical Care Medicine [facility] Pager [phone] or [phone]  11/07/2016, 3:44 AM

## 2016-11-08 NOTE — ED Notes (Addendum)
Dr Mable Paris at bedside with Wells Guiles, RN Lorriane Shire, RN and this RN; pt arrives awake and following commands but confused; EMS arrived at pt's home to find her unresponsive with "seizure like activity"

## 2016-11-08 NOTE — Consult Note (Signed)
Referring Physician: Dr. Bridgett Larsson    Chief Complaint: Altered mental status, seizures in the setting of hypertensive emergency  HPI: Jamie Keller is an 51 y.o. female with a PMHx of ESRD on HD, multiple myeloma, DM who presented to the ED with 2 seizures at home, blood pressure noted to be 035 systolic. Given ativan and started on Keppra. CT head showed PRES. EEG ordered.   Past Medical History:  Diagnosis Date  . Anemia   . Anxiety   . Chronic kidney disease    DIALYSIS  . Depression   . Diabetes mellitus without complication ([place])   . Dialysis patient ([place])    Mon, Wed, Fri  . History of chemotherapy    chemo on Tuesday and Wednesday  . Hypertension   . Multiple myeloma ([place])   . Neuropathy associated with cancer ([place])   . Osteoarthritis   . Pancreatitis   . Pneumonia    September 2017, [facility]  . Post-menopausal 2014  . Sepsis [facility])     Past Surgical History:  Procedure Laterality Date  . A/V SHUNT INTERVENTION Left 07/07/2016   Procedure: A/V Shunt Intervention;  Surgeon: Algernon Huxley, MD;  Location: [place] CV [facility];  Service: Cardiovascular;  Laterality: Left;  . AV FISTULA PLACEMENT Left 02/20/2016   Procedure: INSERTION OF ARTERIOVENOUS (AV) GORE-TEX GRAFT ARM;  Surgeon: Katha Cabal, MD;  Location: [facility] ORS;  Service: Vascular;  Laterality: Left;  . CESAREAN SECTION     x2  . dialysis catheter placement    . PERIPHERAL VASCULAR CATHETERIZATION N/A 11/19/2015   Procedure: Dialysis/Perma Catheter Insertion;  Surgeon: Algernon Huxley, MD;  Location: [place] CV LAB;  Service: Cardiovascular;  Laterality: N/A;  . PERIPHERAL VASCULAR CATHETERIZATION Left 03/10/2016   Procedure: Thrombectomy;  Surgeon: Katha Cabal, MD;  Location: [place] CV [facility];  Service: Cardiovascular;  Laterality: Left;  . PERIPHERAL VASCULAR CATHETERIZATION N/A 03/31/2016   Procedure: Dialysis/Perma Catheter Removal;  Surgeon: Katha Cabal, MD;  Location: [place] [facility];   Service: Cardiovascular;  Laterality: N/A;  . PORTACATH PLACEMENT    . TUBAL LIGATION      Family History  Problem Relation Age of Onset  . Hypercholesterolemia Mother   . Hypertension Mother   . Hypertension Father    Social History:  reports that she has never smoked. She has never used smokeless tobacco. She reports that she does not drink alcohol or use drugs.  Allergies: No Known Allergies  Medications:  Current Facility-Administered Medications:  .  0.9 %  sodium chloride infusion, , Intravenous, Continuous, Pyreddy, Pavan, MD, Last Rate: 75 mL/hr at 11/30/2016 1900 .  0.9 %  sodium chloride infusion, 100 mL, Intravenous, PRN, Lateef, Munsoor, MD .  0.9 %  sodium chloride infusion, 100 mL, Intravenous, PRN, Lateef, Munsoor, MD .  acetaminophen (TYLENOL) tablet 650 mg, 650 mg, Oral, Q6H PRN **OR** acetaminophen (TYLENOL) suppository 650 mg, 650 mg, Rectal, Q6H PRN, Pyreddy, Pavan, MD, 650 mg at 11/30/2016 1641 .  alteplase (CATHFLO ACTIVASE) injection 2 mg, 2 mg, Intracatheter, Once PRN, Lateef, Munsoor, MD .  aspirin EC tablet 81 mg, 81 mg, Oral, Daily, Pyreddy, Pavan, MD .  fentaNYL (DURAGESIC - dosed mcg/hr) patch 25 mcg, 25 mcg, Transdermal, Q72H, Pyreddy, Pavan, MD, 25 mcg at 11/18/2016 1836 .  heparin injection 1,000 Units, 1,000 Units, Dialysis, PRN, Lateef, Munsoor, MD .  heparin injection 5,000 Units, 5,000 Units, Subcutaneous, Q12H, Pyreddy, Pavan, MD, 5,000 Units at 11/10/2016 1002 .  insulin  aspart (novoLOG) injection 0-15 Units, 0-15 Units, Subcutaneous, Q4H, Tukov, Magadalene S, NP, 2 Units at 11/27/2016 1715 .  levETIRAcetam (KEPPRA) 500 mg in sodium chloride 0.9 % 100 mL IVPB, 500 mg, Intravenous, Q12H, Tukov, Magadalene S, NP, Stopped at 11/12/2016 1620 .  lidocaine (PF) (XYLOCAINE) 1 % injection 5 mL, 5 mL, Intradermal, PRN, Lateef, Munsoor, MD .  lidocaine-prilocaine (EMLA) cream 1 application, 1 application, Topical, PRN, Lateef, Munsoor, MD .  LORazepam (ATIVAN) injection  2 mg, 2 mg, Intravenous, Q4H PRN, Pyreddy, Pavan, MD .  MEDLINE mouth rinse, 15 mL, Mouth Rinse, BID, Tukov, Magadalene S, NP, 15 mL at 11/25/2016 1003 .  MEDLINE mouth rinse, 15 mL, Mouth Rinse, BID, Dimas Chyle, MD, 15 mL at 11/20/2016 1003 .  metoprolol tartrate (LOPRESSOR) injection 5 mg, 5 mg, Intravenous, Q6H, Kara Mead V, MD, 5 mg at 11/06/2016 1817 .  nicardipine (CARDENE) 78m in 0.86% saline 2038mIV infusion (0.1 mg/ml), 3-15 mg/hr, Intravenous, Continuous, GuDimas ChyleMD, Last Rate: 60 mL/hr at 11/07/2016 1900, 6 mg/hr at 11/18/2016 1900 .  ondansetron (ZOFRAN) tablet 4 mg, 4 mg, Oral, Q6H PRN **OR** ondansetron (ZOFRAN) injection 4 mg, 4 mg, Intravenous, Q6H PRN, Pyreddy, Pavan, MD .  oxyCODONE (Oxy IR/ROXICODONE) immediate release tablet 10 mg, 10 mg, Oral, Q4H PRN, Pyreddy, Pavan, MD .  pentafluoroprop-tetrafluoroeth (GEBAUERS) aerosol 1 application, 1 application, Topical, PRN, Lateef, Munsoor, MD .  prochlorperazine (COMPAZINE) tablet 10 mg, 10 mg, Oral, Q6H PRN, Pyreddy, Pavan, MD .  senna (SENOKOT) tablet 8.6 mg, 1 tablet, Oral, Daily, Pyreddy, Pavan, MD .  verapamil (CALAN-SR) CR tablet 120 mg, 120 mg, Oral, Daily, Pyreddy, Pavan, MD  Facility-Administered Medications Ordered in Other Encounters:  .  LORazepam (ATIVAN) injection 1 mg, 1 mg, Intravenous, Once, Finnegan, TiKathlene NovemberMD  ROS: Cannot perform due to encephalopathy  Physical Examination: Blood pressure 140/66, pulse (!) 118, temperature 99 F (37.2 C), temperature source Axillary, resp. rate (!) 31, height 5' (1.524 m), weight 52 kg (114 lb 10.2 oz), last menstrual period 02/25/1995, SpO2 98 %.  PHYSICAL EXAM Physical exam: Exam: Gen: NAD, non verbal Eyes: anicteric sclerae, moist conjunctivae                    CV: no MRG, no carotid bruits, no peripheral edema Mental Status: Does not open eyes to voice  Neuro: Detailed Neurologic Exam  Speech:    Non verbal  Cranial Nerves:    The pupils are equal,  round, and reactive to light.. Attempted, Fundi not visualized.  Conjugate midline gaze. Face symmetric, Tongue midline. Cough and gag intact.  Motor Observation:    no involuntary movements noted. Tone appears normal.     Strength:and sensation    Moving all extremities equally to painful stim     Plantars mute.   Laboratory Studies:  Basic Metabolic Panel:  Recent Labs Lab 11/02/16 0852 11/14/2016 0157 11/18/2016 0423 11/26/2016 1202  NA  --  134* 133*  --   K  --  3.7 3.7  --   CL  --  92* 92*  --   CO2  --  24 26  --   GLUCOSE  --  234* 268*  --   BUN  --  52* 54*  --   CREATININE  --  6.46* 6.46*  --   CALCIUM  --  11.9* 11.9*  --   MG 1.9  --  2.4  --   PHOS  --   --  5.8* 2.2*    Liver Function Tests:  Recent Labs Lab 11/06/2016 0157  AST 27  ALT 9*  ALKPHOS 143*  BILITOT 1.2  PROT 6.7  ALBUMIN 3.1*   No results for input(s): LIPASE, AMYLASE in the last 168 hours.  Recent Labs Lab 11/22/2016 0223  AMMONIA 34    CBC:  Recent Labs Lab 11/02/16 0852 11/27/2016 0157 11/20/2016 0423  WBC 2.0* 6.6 6.1  NEUTROABS 1.1* 4.1  --   HGB 7.2* 10.2* 9.5*  HCT 20.5* 29.2* 27.3*  MCV 87.8 87.7 87.4  PLT 111* 112* 106*    Cardiac Enzymes:  Recent Labs Lab 11/06/2016 0157  TROPONINI 0.04*    BNP: Invalid input(s): POCBNP  CBG:  Recent Labs Lab 12/04/2016 0324 11/20/2016 0945 11/26/2016 1134  GLUCAP 279* 143* 118*    Microbiology: Results for orders placed or performed during the hospital encounter of 11/06/2016  MRSA PCR Screening     Status: None   Collection Time: 11/09/2016  3:41 AM  Result Value Ref Range Status   MRSA by PCR NEGATIVE NEGATIVE Final    Comment:        The GeneXpert MRSA Assay (FDA approved for NASAL specimens only), is one component of a comprehensive MRSA colonization surveillance program. It is not intended to diagnose MRSA infection nor to guide or monitor treatment for MRSA infections.     Coagulation Studies: No results  for input(s): LABPROT, INR in the last 72 hours.  Urinalysis: No results for input(s): COLORURINE, LABSPEC, PHURINE, GLUCOSEU, HGBUR, BILIRUBINUR, KETONESUR, PROTEINUR, UROBILINOGEN, NITRITE, LEUKOCYTESUR in the last 168 hours.  Invalid input(s): APPERANCEUR  Lipid Panel: No results found for: CHOL, TRIG, HDL, CHOLHDL, VLDL, LDLCALC  HgbA1C:  Lab Results  Component Value Date   HGBA1C 5.4 07/05/2016    Urine Drug Screen:  No results found for: LABOPIA, COCAINSCRNUR, LABBENZ, AMPHETMU, THCU, LABBARB  Alcohol Level:  Recent Labs Lab 11/07/2016 0157  ETH <5    Other results:.  Imaging: Ct Head Wo Contrast  Result Date: 11/19/2016 CLINICAL DATA:  Altered level of consciousness.  Seizure. EXAM: CT HEAD WITHOUT CONTRAST TECHNIQUE: Contiguous axial images were obtained from the base of the skull through the vertex without intravenous contrast. COMPARISON:  Brain MRI 04/30/2016, head CT 12/08/2015 FINDINGS: Brain: Symmetric low-density within bilateral posterior parietooccipital lobes. No evidence of acute hemorrhage. No hydrocephalus, midline shift or mass effect. No subdural or extra-axial fluid collection. Vascular:  No hyperdense vessel. Skull: Suggestion of diffuse sclerosis. Subcentimeter lytic lesion in the high right frontoparietal calvarium is new from prior exam. Subcentimeter lytic lesion in the left frontal bone is also new. Unchanged lytic skullbase lesion. Smaller subcentimeter lesions in the left frontal bone are unchanged from previous. Sinuses/Orbits: Paranasal sinuses and mastoid air cells are clear. The visualized orbits are unremarkable. Other: None. IMPRESSION: 1. Symmetric low-density within posterior parietooccipital lobes suspicious for PRES (posterior reversible encephalopathy syndrome). Consider MRI for further evaluation. 2. Subcentimeter calvarial lesions in right and left frontal bone there are new from October 2017 head CT likely related to myeloma. Electronically  Signed   By: Jeb Levering M.D.   On: 11/20/2016 02:25   Dg Chest Port 1 View  Result Date: 11/15/2016 CLINICAL DATA:  Cough.  Seizure-like activity. EXAM: PORTABLE CHEST 1 VIEW COMPARISON:  Chest radiograph 05/26/2016. FINDINGS: Tip of the right chest port in the distal SVC. Heart size upper normal. Mediastinal contours are normal. Improved bilateral pleural effusions from prior exam, suspect small volume residual or recurrent  pleural fluid. Improved pulmonary edema from prior exam. Bony sclerosis with remote bilateral rib fractures, likely related to myeloma. Lytic lesion in the right clavicle. IMPRESSION: 1. Small pleural effusions, improved from prior exam, may be persistent or recurrent. Improved pulmonary edema, mild residual or recurrent vascular congestion. 2. Bony scleroses and bilateral rib fractures likely related to myeloma. Electronically Signed   By: Jeb Levering M.D.   On: 11/22/2016 02:36    Assessment: 51 y.o. female with encephalopathy, seizures and PRES in the setting of hypertensive crisis.  Plan: 1. Continue Keppra  2. MRI  of the brain without contrast 3. PT consult, OT consult, Speech consult 4. EEG ordered 5. On nicardipine drop for HTN, currently receiving hemodialysis 6. Frequent neuro Check 7. Keppra should be continued outpatient until follow up outpatient neurology exam and possibly repeat MRI outpatient to follow PRES 8. We will continue to follow with you  Discussed with [facility] attending Dr. Dimas Chyle  This patient is critically ill and at significant risk of neurological worsening, death and care requires constant monitoring of vital signs, hemodynamics,respiratory and cardiac monitoring,review of multiple databases, neurological assessment, discussion with family, other specialists and medical decision making of high complexity.I  I spent 30 minutes of neurocritical care time in the care of this patient.  Sarina Ill, MD neurology 11/07/2016, 4:34  PM

## 2016-11-08 NOTE — ED Notes (Signed)
Pt seizing; MD at bedside

## 2016-11-08 NOTE — Progress Notes (Signed)
Lovenox changed to heparin [place] 5000 units q 12 hours for CrCl <15.

## 2016-11-08 NOTE — Progress Notes (Signed)
Hd completed 

## 2016-11-08 NOTE — ED Provider Notes (Signed)
[facility] Emergency Department Provider Note  ____________________________________________   First MD Initiated Contact with Patient 11/18/2016 [phone]     (approximate)  I have reviewed the triage vital signs and the nursing notes.   HISTORY  Chief Complaint Seizures  Level V exemption history Limited by the patient's altered mental status  HPI Jamie Keller is a 51 y.o. female who comes to the emergency department via EMS after having a generalized tonic-clonic seizure. According to the patient's husband she was feeling unwell today nauseated and vomited several times. She went to bed and while she was sleeping she began to shake so her husband called 911. EMS noted an elevated blood pressure to the 220s and a blood sugar in the 190s. She has a past medical history of stage IV multiple myeloma as well as end-stage renal disease for which she is dialyzed through her left upper extremity. She has never had a seizure before.   Past Medical History:  Diagnosis Date  . Anemia   . Anxiety   . Chronic kidney disease    DIALYSIS  . Depression   . Diabetes mellitus without complication ([place])   . Dialysis patient ([place])    Mon, Wed, Fri  . History of chemotherapy    chemo on Tuesday and Wednesday  . Hypertension   . Multiple myeloma ([place])   . Neuropathy associated with cancer ([place])   . Osteoarthritis   . Pancreatitis   . Pneumonia    September 2017, [facility]  . Post-menopausal 2014  . Sepsis [facility])     Patient Active Problem List   Diagnosis Date Noted  . Hypertensive urgency 11/26/2016  . Symptomatic anemia 10/20/2016  . Hypocalcemia   . Acute on chronic renal failure ([place]) 06/20/2016  . Abnormal EKG 06/20/2016  . Vomiting 06/20/2016  . Respiratory failure ([place]) 05/25/2016  . Respiratory distress   . Multiple myeloma in relapse ([place]) 05/01/2016  . Influenza B 04/02/2016  . Pulmonary edema 04/01/2016  . Hyperkalemia 04/01/2016  . Epigastric  pain   . Atypical pneumonia 12/26/2015  . Immunocompromised ([place]) 12/26/2015  . Sepsis ([place]) 12/01/2015  . HCAP (healthcare-associated pneumonia) 12/01/2015  . End stage renal disease ([place]) 12/01/2015  . HTN (hypertension) 12/01/2015  . Depression 12/01/2015  . Acute respiratory failure ([place]) 11/14/2015  . Acute renal failure ([place])   . Post-menopausal   . Bulging lumbar disc 05/28/2014    Past Surgical History:  Procedure Laterality Date  . A/V SHUNT INTERVENTION Left 07/07/2016   Procedure: A/V Shunt Intervention;  Surgeon: Algernon Huxley, MD;  Location: [place] [facility];  Service: Cardiovascular;  Laterality: Left;  . AV FISTULA PLACEMENT Left 02/20/2016   Procedure: INSERTION OF ARTERIOVENOUS (AV) GORE-TEX GRAFT ARM;  Surgeon: Katha Cabal, MD;  Location: [facility] ORS;  Service: Vascular;  Laterality: Left;  . CESAREAN SECTION     x2  . dialysis catheter placement    . PERIPHERAL VASCULAR CATHETERIZATION N/A 11/19/2015   Procedure: Dialysis/Perma Catheter Insertion;  Surgeon: Algernon Huxley, MD;  Location: [place] [facility];  Service: Cardiovascular;  Laterality: N/A;  . PERIPHERAL VASCULAR CATHETERIZATION Left 03/10/2016   Procedure: Thrombectomy;  Surgeon: Katha Cabal, MD;  Location: [place] [facility];  Service: Cardiovascular;  Laterality: Left;  . PERIPHERAL VASCULAR CATHETERIZATION N/A 03/31/2016   Procedure: Dialysis/Perma Catheter Removal;  Surgeon: Katha Cabal, MD;  Location: [place] [facility];  Service: Cardiovascular;  Laterality: N/A;  . PORTACATH PLACEMENT    .  TUBAL LIGATION      Prior to Admission medications   Medication Sig Start Date End Date Taking? Authorizing Provider  diclofenac sodium (VOLTAREN) 1 % GEL Apply 2 g topically 4 (four) times daily. 10/26/16   Verlon Au, NP  fentaNYL (DURAGESIC - DOSED MCG/HR) 25 MCG/HR patch Place 1 patch (25 mcg total) onto the skin every 3 (three) days. 10/26/16   Lloyd Huger, MD  ferric citrate  (AURYXIA) 1 GM 210 MG(Fe) tablet Take 2 tablets (420 mg total) by mouth 3 (three) times daily with meals. 10/23/16   Fritzi Mandes, MD  Insulin Detemir (LEVEMIR FLEXPEN) 100 UNIT/ML Pen Inject 5 Units into the skin daily at 10 pm.    [provider]  Insulin Pen Needle (NOVOFINE) 30G X 8 MM MISC Inject 10 each into the skin as needed. 04/02/16   Hillary Bow, MD  ondansetron (ZOFRAN) 8 MG tablet Take by mouth every 8 (eight) hours as needed for nausea or vomiting.    [provider]  Oxycodone HCl 10 MG TABS Take 1 tablet (10 mg total) by mouth every 4 (four) hours as needed (take 1 tablet by mouth every 4 - 6 hours as needed for severe pain). 10/26/16   Lloyd Huger, MD  prochlorperazine (COMPAZINE) 10 MG tablet Take 1 tablet (10 mg total) by mouth every 6 (six) hours as needed for nausea or vomiting. 09/07/16   Lloyd Huger, MD  senna (SENOKOT) 8.6 MG tablet Take 1 tablet by mouth daily.    [provider]  verapamil (CALAN-SR) 120 MG CR tablet Take 120 mg by mouth daily.    [provider]    Allergies Patient has no known allergies.  Family History  Problem Relation Age of Onset  . Hypercholesterolemia Mother   . Hypertension Mother   . Hypertension Father     Social History Social History  Substance Use Topics  . Smoking status: Never Smoker  . Smokeless tobacco: Never Used  . Alcohol use No    Review of Systems Level V exemption history Limited by the patient's clinical condition ____________________________________________   PHYSICAL EXAM:  VITAL SIGNS: ED Triage Vitals  Enc Vitals Group     BP      Pulse      Resp      Temp      Temp src      SpO2      Weight      Height      Head Circumference      Peak Flow      Pain Score      Pain Loc      Pain Edu?      Excl. in [place]?     Constitutional: Alert and oriented 2 to name and place quite confused and speaking slowly Eyes: PERRL EOMI. pupils midrange and  brisk Head: Atraumatic. Nose: No congestion/rhinnorhea. Mouth/Throat: No trismus bilateral tongue lateral bites Neck: No stridor.   Cardiovascular: Normal rate, regular rhythm. Grossly normal heart sounds.  Good peripheral circulation. Respiratory: Normal respiratory effort.  No retractions. Lungs CTAB and moving good air Gastrointestinal: Soft mild diffuse tenderness with no focality Musculoskeletal: No lower extremity edema   Neurologic:   No gross focal neurologic deficits are appreciated. Skin:  Skin is warm, dry and intact. No rash noted. Psychiatric: Slow mentation clearly confused  ____________________________________________   DIFFERENTIAL includes but not limited to  Hypertensive emergency, intracerebral hemorrhage, brain metastasis, hyponatremia, infection ____________________________________________  LABS (all labs ordered are listed, but only abnormal results are displayed)  Labs Reviewed  BASIC METABOLIC PANEL - Abnormal; Notable for the following:       Result Value   Sodium 134 (*)    Chloride 92 (*)    Glucose, Bld 234 (*)    BUN 52 (*)    Creatinine, Ser 6.46 (*)    Calcium 11.9 (*)    GFR calc non Af Amer 7 (*)    GFR calc Af Amer 8 (*)    Anion gap 18 (*)    All other components within normal limits  HEPATIC FUNCTION PANEL - Abnormal; Notable for the following:    Albumin 3.1 (*)    ALT 9 (*)    Alkaline Phosphatase 143 (*)    Indirect Bilirubin 1.0 (*)    All other components within normal limits  TROPONIN I - Abnormal; Notable for the following:    Troponin I 0.04 (*)    All other components within normal limits  CBC WITH DIFFERENTIAL/PLATELET - Abnormal; Notable for the following:    RBC 3.33 (*)    Hemoglobin 10.2 (*)    HCT 29.2 (*)    RDW 14.6 (*)    Platelets 112 (*)    Monocytes Absolute 1.3 (*)    All other components within normal limits  ETHANOL  TSH  URINALYSIS, COMPLETE (UACMP) WITH MICROSCOPIC  URINE DRUG SCREEN, QUALITATIVE  ([facility] ONLY)  AMMONIA    Hemoglobin stable. Elevated troponin likely secondary to demand. Elevated calcium concerning for progression of malignancy. No metabolic etiology of her seizure identified __________________________________________  EKG  ED ECG REPORT I, Darel Hong, the attending physician, personally viewed and interpreted this ECG.  Date: 11/13/2016 Rate: 91 Rhythm: normal sinus rhythm QRS Axis: normal Intervals: normal ST/T Wave abnormalities: normal Narrative Interpretation: no evidence of acute ischemia  ____________________________________________  RADIOLOGY  CT scan concerning for posterior reversible encephalopathy syndrome ____________________________________________   PROCEDURES  Procedure(s) performed: no  Procedures  Critical Care performed: yes  CRITICAL CARE Performed by: Darel Hong   Total critical care time: 35 minutes  Critical care time was exclusive of separately billable procedures and treating other patients.  Critical care was necessary to treat or prevent imminent or life-threatening deterioration.  Critical care was time spent personally by me on the following activities: development of treatment plan with patient and/or surrogate as well as nursing, discussions with consultants, evaluation of patient's response to treatment, examination of patient, obtaining history from patient or surrogate, ordering and performing treatments and interventions, ordering and review of laboratory studies, ordering and review of radiographic studies, pulse oximetry and re-evaluation of patient's condition.   Observation: no ____________________________________________   INITIAL IMPRESSION / ASSESSMENT AND PLAN / ED COURSE  Pertinent labs & imaging results that were available during my care of the patient were reviewed by me and considered in my medical decision making (see chart for details).  When the patient arrived she was alert and  oriented to her name and location, however did not know the year what happened to her. Her neuro exam was nonfocal. Prior to going to the CT scanner she had a second generalized tonic-clonic seizure lasting roughly 1 minute. She did desaturate to about 82% however came up nicely with oxygen and a nasal trumpet. I gave her 20 mg of labetalol in case this was a hypertensive emergency as well as 2 mg of lorazepam and took her to the CT scanner. She will require  inpatient admission given multiple seizures however the etiology of the seizures whether they are infectious, metabolic, or structural are pending.     ----------------------------------------- 2:39 AM on 11/12/2016 -----------------------------------------  CT scan is concerning for PRES.  Nicardipine drip started and she'll require admission to a monitored setting. ____________________________________________   FINAL CLINICAL IMPRESSION(S) / ED DIAGNOSES  Final diagnoses:  Status epilepticus ([place])  PRES (posterior reversible encephalopathy syndrome)      NEW MEDICATIONS STARTED DURING THIS VISIT:  New Prescriptions   No medications on file     Note:  This document was prepared using Dragon voice recognition software and may include unintentional dictation errors.     Darel Hong, MD 11/16/2016 [phone]

## 2016-11-08 NOTE — ED Notes (Signed)
Dr Mable Paris at bedside speaking with son, daughter and husband; pt resting with eyes closed, resp even; sats 100% on oxygen 2L;

## 2016-11-08 NOTE — ED Notes (Signed)
Pt back from CT

## 2016-11-08 NOTE — ED Notes (Signed)
Admitting MD at bedside.

## 2016-11-09 ENCOUNTER — Inpatient Hospital Stay: Payer: Self-pay | Admitting: Oncology

## 2016-11-09 ENCOUNTER — Inpatient Hospital Stay: Payer: Self-pay

## 2016-11-09 ENCOUNTER — Inpatient Hospital Stay: Payer: Medicaid Other

## 2016-11-09 ENCOUNTER — Inpatient Hospital Stay (HOSPITAL_COMMUNITY): Payer: Medicaid Other

## 2016-11-09 DIAGNOSIS — R569 Unspecified convulsions: Secondary | ICD-10-CM

## 2016-11-09 DIAGNOSIS — I6783 Posterior reversible encephalopathy syndrome: Secondary | ICD-10-CM

## 2016-11-09 DIAGNOSIS — I160 Hypertensive urgency: Secondary | ICD-10-CM

## 2016-11-09 DIAGNOSIS — G40901 Epilepsy, unspecified, not intractable, with status epilepticus: Principal | ICD-10-CM

## 2016-11-09 LAB — CBC WITH DIFFERENTIAL/PLATELET
BASOS PCT: 0 %
Basophils Absolute: 0 10*3/uL (ref 0–0.1)
EOS ABS: 0 10*3/uL (ref 0–0.7)
Eosinophils Relative: 0 %
HEMATOCRIT: 25.7 % — AB (ref 35.0–47.0)
HEMOGLOBIN: 8.9 g/dL — AB (ref 12.0–16.0)
LYMPHS PCT: 6 %
Lymphs Abs: 0.4 10*3/uL — ABNORMAL LOW (ref 1.0–3.6)
MCH: 30.6 pg (ref 26.0–34.0)
MCHC: 34.6 g/dL (ref 32.0–36.0)
MCV: 88.5 fL (ref 80.0–100.0)
Monocytes Absolute: 0.7 10*3/uL (ref 0.2–0.9)
Monocytes Relative: 11 %
NEUTROS ABS: 5 10*3/uL (ref 1.4–6.5)
Neutrophils Relative %: 83 %
Platelets: 103 10*3/uL — ABNORMAL LOW (ref 150–440)
RBC: 2.91 MIL/uL — ABNORMAL LOW (ref 3.80–5.20)
RDW: 15.4 % — ABNORMAL HIGH (ref 11.5–14.5)
WBC: 6.1 10*3/uL (ref 3.6–11.0)

## 2016-11-09 LAB — BASIC METABOLIC PANEL
Anion gap: 15 (ref 5–15)
BUN: 21 mg/dL — AB (ref 6–20)
CHLORIDE: 99 mmol/L — AB (ref 101–111)
CO2: 26 mmol/L (ref 22–32)
CREATININE: 3.49 mg/dL — AB (ref 0.44–1.00)
Calcium: 10.1 mg/dL (ref 8.9–10.3)
GFR calc Af Amer: 16 mL/min — ABNORMAL LOW (ref >60)
GFR calc non Af Amer: 14 mL/min — ABNORMAL LOW (ref >60)
Glucose, Bld: 126 mg/dL — ABNORMAL HIGH (ref 65–99)
Potassium: 3 mmol/L — ABNORMAL LOW (ref 3.5–5.1)
SODIUM: 140 mmol/L (ref 135–145)

## 2016-11-09 LAB — GLUCOSE, CAPILLARY
GLUCOSE-CAPILLARY: 123 mg/dL — AB (ref 65–99)
Glucose-Capillary: 120 mg/dL — ABNORMAL HIGH (ref 65–99)
Glucose-Capillary: 115 mg/dL — ABNORMAL HIGH (ref 65–99)
Glucose-Capillary: 202 mg/dL — ABNORMAL HIGH (ref 65–99)
Glucose-Capillary: 153 mg/dL — ABNORMAL HIGH (ref 65–99)

## 2016-11-09 LAB — PROCALCITONIN: PROCALCITONIN: 12.7 ng/mL

## 2016-11-09 LAB — PARATHYROID HORMONE, INTACT (NO CA): PTH: 23 pg/mL (ref 15–65)

## 2016-11-09 MED ORDER — SODIUM CHLORIDE 0.9% FLUSH
10.0000 mL | Freq: Two times a day (BID) | INTRAVENOUS | Status: DC
  Administered 2016-11-10 – 2016-11-21 (×21): 10 mL

## 2016-11-09 MED ORDER — CINACALCET HCL 30 MG PO TABS
30.0000 mg | ORAL_TABLET | Freq: Every day | ORAL | Status: DC
  Administered 2016-11-09 – 2016-11-13 (×4): 30 mg via ORAL
  Filled 2016-11-09 (×5): qty 1

## 2016-11-09 MED ORDER — SODIUM CHLORIDE 0.9% FLUSH
10.0000 mL | INTRAVENOUS | Status: DC | PRN
  Administered 2016-11-20: 10 mL
  Filled 2016-11-09: qty 40

## 2016-11-09 MED ORDER — IRBESARTAN 150 MG PO TABS
150.0000 mg | ORAL_TABLET | Freq: Every day | ORAL | Status: DC
  Administered 2016-11-09 – 2016-11-11 (×3): 150 mg via ORAL
  Filled 2016-11-09 (×3): qty 1

## 2016-11-09 NOTE — Progress Notes (Signed)
Pt transferred to room 236 from ICU. Interpreter present for assessment and medication administration. Pt A&O X2; disoriented to time and situation. Denies pain. VSS. Call bell provided to pt with instructions on how to use. Pt had 1 episode of emesis and was given IV Zofran with relief. Pt denies any other needs at this time. Will continue to monitor.

## 2016-11-09 NOTE — Consult Note (Signed)
PULMONARY / CRITICAL CARE MEDICINE   Name: Kamie Korber MRN: [id number] DOB: 1965-12-23    ADMISSION DATE:  11/25/2016   CONSULTATION DATE:  11/07/16  REFERRING MD:  Dr Estanislado Pandy  REASON: NEW ONSET SEIZURES  CHIEF COMPLAINT: AMS  HISTORY OF PRESENT ILLNESS:   His is a 51 year old female with a past medical history of end-stage renal disease on hemodialysis, presenting with hypertensive crisis and new onset seizures.EMS was called because patient had patient had a seizure-like activity. Per patient's husband, she had not been feeling well all day with episodes of nausea and vomiting. Upon arrival in the ED, patient was awake but confused. She had another seizure while in the ED and was given lorazepam.she is currently somnolent and unable to follow commands. She is maintaining her airway and her SPO2 is in the high 90s. Patient has no documented history of seizures. Her systolic blood pressure was in the 200s. CT head was negative for any acute bleed, but suggested PRES. She was started on a Cardene infusion and admitted to the ICU for further management   subjective  Patient remains intermittently confsed  On  Nicardipine infusion  remains step down status   on nicardipine infusion   REVIEW OF SYSTEMS:   Unable to obtain as patient is currently sedated    VITAL SIGNS: BP (!) 137/52 (BP Location: Right Arm)   Pulse (!) 111   Temp 98.3 F (36.8 C) (Oral)   Resp (!) 22   Ht 5' (1.524 m)   Wt 114 lb 10.2 oz (52 kg)   LMP 02/25/1995 Comment: Tubal ligation 1996  SpO2 95%   BMI 22.39 kg/m        INTAKE / OUTPUT: I/O last 3 completed shifts: In: 3793.8 [I.V.:3693.8; IV Piggyback:100] Out: 2000 [Other:2000]  PHYSICAL EXAMINATION: General:  On a daily ill-looking Neuro:  Somnolent, unresponsive to voice, but withdraws to noxious  stimulus, corneal reflexes intact HEENT: PERRLA, oral mucosa dry Cardiovascular:  Tachycardic, S1, S2, no murmur, regurg or gallop, no  edema Lungs:  Bilateral Breath sounds, no wheezing or rhonchi Abdomen:  Soft, non tender, non distended, normal bowel sounds Musculoskeletal:  Positive range of motion. No deformities Skin:  Warm and dry LABS:  BMET  Recent Labs Lab 11/30/2016 0157 12/02/2016 0423 11/09/16 0335  NA 134* 133* 140  K 3.7 3.7 3.0*  CL 92* 92* 99*  CO2 24 26 26   BUN 52* 54* 21*  CREATININE 6.46* 6.46* 3.49*  GLUCOSE 234* 268* 126*    Electrolytes  Recent Labs Lab 11/19/2016 0157 11/12/2016 0423 11/15/2016 1202 11/09/16 0335  CALCIUM 11.9* 11.9*  --  10.1  MG  --  2.4  --   --   PHOS  --  5.8* 2.2*  --     CBC  Recent Labs Lab 12/05/2016 0157 11/20/2016 0423 11/09/16 0335  WBC 6.6 6.1 6.1  HGB 10.2* 9.5* 8.9*  HCT 29.2* 27.3* 25.7*  PLT 112* 106* 103*    Coag's No results for input(s): APTT, INR in the last 168 hours.  Sepsis Markers  Recent Labs Lab 11/09/2016 0423 11/09/16 0335  PROCALCITON 3.08 12.70    ABG No results for input(s): PHART, PCO2ART, PO2ART in the last 168 hours.  Liver Enzymes  Recent Labs Lab 11/18/2016 0157  AST 27  ALT 9*  ALKPHOS 143*  BILITOT 1.2  ALBUMIN 3.1*    Cardiac Enzymes  Recent Labs Lab 11/29/2016 0157  TROPONINI 0.04*      STUDIES:  EEG  CULTURES: Urine cultures Blood culture  ANTIBIOTICS: none  SIGNIFICANT EVENTS: 11/07/2016: Admitted  LINES/TUBES: Peripheral IVs  DISCUSSION: 51 year old female presenting with new onset seizures,acute encephalopathy and hypertensive crisis  ASSESSMENT  New onset seizures-CT head negative for any acute bleed or infarct. Acute encephalopathy secondary to seizures. Hypertensive emergency. PRES Rule out any infectious process-chest x-ray unremarkable, urine culture and blood cultures pending End-stage renal disease on hemodialysis. Type 2 diabetes mellitus. History of multiple myeloma. History of anemia  PLAN] Seizure precautions Keppra 1 g IV 1 and then 500 mg IV every 12  hours EEG Follow Neurologic consult Follow-up blood and urine cultures to eliminate any infectious process. Hemodialysis per nephrology Sliding-scale insulin coverage with blood glucose monitoring every 4 hours Cardene infusion to maintain systolic blood pressure between 140 and 160 mmHg GI and DVT prophylaxis  The changes in treatment plan pending clinical course and diagnostics FAMILY  - Updates: no family at bedside. We will updated when available   Corrin Parker, M.D.  [facility]  Medical Director [place] Director [facility] Cardio-Pulmonary Department

## 2016-11-09 NOTE — Progress Notes (Signed)
Subjective: Patient with no further seizures on Keppra.  More awake and alert today.  Objective: Current vital signs: BP (!) 137/52 (BP Location: Right Arm)   Pulse (!) 111   Temp 98.3 F (36.8 C) (Oral)   Resp (!) 22   Ht 5' (1.524 m)   Wt 52 kg (114 lb 10.2 oz)   LMP 02/25/1995 Comment: Tubal ligation 1996  SpO2 95%   BMI 22.39 kg/m  Vital signs in last 24 hours: Temp:  [98.3 F (36.8 C)-100.8 F (38.2 C)] 98.3 F (36.8 C) (09/04 0400) Pulse Rate:  [108-136] 111 (09/04 0400) Resp:  [22-31] 22 (09/04 0400) BP: (129-163)/(42-125) 137/52 (09/04 0400) SpO2:  [95 %-98 %] 95 % (09/04 0400)  Intake/Output from previous day: 09/03 0701 - 09/04 0700 In: 3429.8 [I.V.:3429.8] Out: 2000  Intake/Output this shift: Total I/O In: 525 [P.O.:120; I.V.:405] Out: -  Nutritional status: Diet heart healthy/carb modified Room service appropriate? Yes; Fluid consistency: Thin  Neurologic Exam: Mental Status: Alert, knows the year but reports that it is Monday.  Follows commands.  Speech fluent. Cranial Nerves: II: Discs flat bilaterally; Visual fields grossly normal, pupils equal, round, reactive to light and accommodation III,IV, VI: ptosis not present, extra-ocular motions intact bilaterally V,VII: smile symmetric, facial light touch sensation normal bilaterally VIII: hearing normal bilaterally IX,X: gag reflex present XI: bilateral shoulder shrug XII: midline tongue extension Motor: Generalized weakness with no focal weakness noted and patient able to lift all extremities against gravity.    Lab Results: Basic Metabolic Panel:  Recent Labs Lab 11/22/2016 0157 11/19/2016 0423 11/24/2016 1202 11/09/16 0335  NA 134* 133*  --  140  K 3.7 3.7  --  3.0*  CL 92* 92*  --  99*  CO2 24 26  --  26  GLUCOSE 234* 268*  --  126*  BUN 52* 54*  --  21*  CREATININE 6.46* 6.46*  --  3.49*  CALCIUM 11.9* 11.9*  --  10.1  MG  --  2.4  --   --   PHOS  --  5.8* 2.2*  --     Liver Function  Tests:  Recent Labs Lab 11/24/2016 0157  AST 27  ALT 9*  ALKPHOS 143*  BILITOT 1.2  PROT 6.7  ALBUMIN 3.1*   No results for input(s): LIPASE, AMYLASE in the last 168 hours.  Recent Labs Lab 11/09/2016 0223  AMMONIA 34    CBC:  Recent Labs Lab 11/13/2016 0157 11/26/2016 0423 11/09/16 0335  WBC 6.6 6.1 6.1  NEUTROABS 4.1  --  5.0  HGB 10.2* 9.5* 8.9*  HCT 29.2* 27.3* 25.7*  MCV 87.7 87.4 88.5  PLT 112* 106* 103*    Cardiac Enzymes:  Recent Labs Lab 11/17/2016 0157  TROPONINI 0.04*    Lipid Panel: No results for input(s): CHOL, TRIG, HDL, CHOLHDL, VLDL, LDLCALC in the last 168 hours.  CBG:  Recent Labs Lab 12/03/2016 1954 11/26/2016 2346 11/09/16 0350 11/09/16 0742 11/09/16 1110  GLUCAP 124* 97 120* 115* 56*    Microbiology: Results for orders placed or performed during the hospital encounter of 11/27/2016  MRSA PCR Screening     Status: None   Collection Time: 12/04/2016  3:41 AM  Result Value Ref Range Status   MRSA by PCR NEGATIVE NEGATIVE Final    Comment:        The GeneXpert MRSA Assay (FDA approved for NASAL specimens only), is one component of a comprehensive MRSA colonization surveillance program. It is not intended  to diagnose MRSA infection nor to guide or monitor treatment for MRSA infections.     Coagulation Studies: No results for input(s): LABPROT, INR in the last 72 hours.  Imaging: Ct Head Wo Contrast  Result Date: 11/12/2016 CLINICAL DATA:  Altered level of consciousness.  Seizure. EXAM: CT HEAD WITHOUT CONTRAST TECHNIQUE: Contiguous axial images were obtained from the base of the skull through the vertex without intravenous contrast. COMPARISON:  Brain MRI 04/30/2016, head CT 12/08/2015 FINDINGS: Brain: Symmetric low-density within bilateral posterior parietooccipital lobes. No evidence of acute hemorrhage. No hydrocephalus, midline shift or mass effect. No subdural or extra-axial fluid collection. Vascular:  No hyperdense vessel.  Skull: Suggestion of diffuse sclerosis. Subcentimeter lytic lesion in the high right frontoparietal calvarium is new from prior exam. Subcentimeter lytic lesion in the left frontal bone is also new. Unchanged lytic skullbase lesion. Smaller subcentimeter lesions in the left frontal bone are unchanged from previous. Sinuses/Orbits: Paranasal sinuses and mastoid air cells are clear. The visualized orbits are unremarkable. Other: None. IMPRESSION: 1. Symmetric low-density within posterior parietooccipital lobes suspicious for PRES (posterior reversible encephalopathy syndrome). Consider MRI for further evaluation. 2. Subcentimeter calvarial lesions in right and left frontal bone there are new from October 2017 head CT likely related to myeloma. Electronically Signed   By: Jeb Levering M.D.   On: 11/09/2016 02:25   Mr Brain Wo Contrast  Result Date: 11/09/2016 CLINICAL DATA:  51 y/o  F; seizure episodes with hypertension. EXAM: MRI HEAD WITHOUT CONTRAST TECHNIQUE: Multiplanar, multiecho pulse sequences of the brain and surrounding structures were obtained without intravenous contrast. COMPARISON:  11/20/2016 CT head.  04/30/2016 MRI head. FINDINGS: Brain: T2 FLAIR hyperintense signal abnormality involving cortex and subcortical white matter with a predominantly posterior distribution in the supratentorial brain, typical of PRES. No reduced diffusion to suggest acute or early subacute infarction. No abnormal susceptibility hypointensity to indicate intracranial hemorrhage. There is cortical swelling with mild local mass effect associated with the areas of T2 FLAIR signal abnormality. No effacement of basilar cisterns, herniation, or extra-axial collection. Mild diffuse brain parenchymal volume loss. Vascular: Normal flow voids. 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 Sinuses/Orbits: Negative. Other: None. IMPRESSION: 1. Findings  typical of acute hypertensive encephalopathy, PRES, involving the posterior supratentorial brain. 2. No evidence for acute infarction, hemorrhage, or significant mass effect. 3. Mild brain parenchymal volume loss. Electronically Signed   By: Kristine Garbe M.D.   On: 11/09/2016 03:09   Dg Chest Port 1 View  Result Date: 11/17/2016 CLINICAL DATA:  Cough.  Seizure-like activity. EXAM: PORTABLE CHEST 1 VIEW COMPARISON:  Chest radiograph 05/26/2016. FINDINGS: Tip of the right chest port in the distal SVC. Heart size upper normal. Mediastinal contours are normal. Improved bilateral pleural effusions from prior exam, suspect small volume residual or recurrent pleural fluid. Improved pulmonary edema from prior exam. Bony sclerosis with remote bilateral rib fractures, likely related to myeloma. Lytic lesion in the right clavicle. IMPRESSION: 1. Small pleural effusions, improved from prior exam, may be persistent or recurrent. Improved pulmonary edema, mild residual or recurrent vascular congestion. 2. Bony scleroses and bilateral rib fractures likely related to myeloma. Electronically Signed   By: Jeb Levering M.D.   On: 11/22/2016 02:36    Medications:  I have reviewed the patient's current medications. Scheduled: . aspirin EC  81 mg Oral Daily  . cinacalcet  30 mg Oral Q supper  . fentaNYL  25 mcg Transdermal Q72H  .  heparin subcutaneous  5,000 Units Subcutaneous Q12H  . insulin aspart  0-15 Units Subcutaneous Q4H  . irbesartan  150 mg Oral Daily  . mouth rinse  15 mL Mouth Rinse BID  . mouth rinse  15 mL Mouth Rinse BID  . metoprolol tartrate  5 mg Intravenous Q6H  . senna  1 tablet Oral Daily  . verapamil  120 mg Oral Daily    Assessment/Plan: No further seizures.  Patient remains on Keppra.  BP improved.  MRI of the brain reviewed and findings consistent with PRES.    Recommendations: 1.  Agree with continued BP control 2.  Would continue Keppra with change to po once patient  able to tolerate po.     LOS: 1 day   Alexis Goodell, MD Neurology [phone] 11/09/2016  1:06 PM

## 2016-11-09 NOTE — Progress Notes (Signed)
[facility]  ROUNDING NOTE   Subjective:  Hemodialysis yesterday. Tolerated treatment well. UF of 2 litres.   History taken with assistance of a Administrator, sports.   Still complains of confusion.   Tmax 100.8  contineus on a nicardipine gtt.   MRI showed PRES  Calcium 10.1 (11.5)  Objective:  Vital signs in last 24 hours:  Temp:  [98.3 F (36.8 C)-100.8 F (38.2 C)] 98.3 F (36.8 C) (09/04 0400) Pulse Rate:  [108-136] 111 (09/04 0400) Resp:  [22-31] 22 (09/04 0400) BP: (119-163)/(42-125) 137/52 (09/04 0400) SpO2:  [95 %-98 %] 95 % (09/04 0400) Weight:  [52 kg (114 lb 10.2 oz)] 52 kg (114 lb 10.2 oz) (09/03 1100)  Weight change: 4.826 kg (10 lb 10.2 oz) Filed Weights   11/11/2016 0213 11/12/2016 1100  Weight: 47.2 kg (104 lb) 52 kg (114 lb 10.2 oz)    Intake/Output: I/O last 3 completed shifts: In: 3793.8 [I.V.:3693.8; IV Piggyback:100] Out: 2000 [Other:2000]   Intake/Output this shift:  Total I/O In: 525 [P.O.:120; I.V.:405] Out: -   Physical Exam: General: ill appearing   Head: Normocephalic, atraumatic. Moist oral mucosal membranes  Eyes: Anicteric  Neck: Supple, trachea midline  Lungs:  Clear to auscultation, normal effort  Heart: S1S2 no rubs  Abdomen:  Soft, nontender, bowel sounds present  Extremities: No peripheral edema.  Neurologic: Lethargic, alert times 3   Skin: No lesions  Access: LUE AV access    Basic Metabolic Panel:  Recent Labs Lab 11/20/2016 0157 11/23/2016 0423 12/05/2016 1202 11/09/16 0335  NA 134* 133*  --  140  K 3.7 3.7  --  3.0*  CL 92* 92*  --  99*  CO2 24 26  --  26  GLUCOSE 234* 268*  --  126*  BUN 52* 54*  --  21*  CREATININE 6.46* 6.46*  --  3.49*  CALCIUM 11.9* 11.9*  --  10.1  MG  --  2.4  --   --   PHOS  --  5.8* 2.2*  --     Liver Function Tests:  Recent Labs Lab 11/07/2016 0157  AST 27  ALT 9*  ALKPHOS 143*  BILITOT 1.2  PROT 6.7  ALBUMIN 3.1*   No results for input(s): LIPASE, AMYLASE  in the last 168 hours.  Recent Labs Lab 11/30/2016 0223  AMMONIA 34    CBC:  Recent Labs Lab 11/25/2016 0157 11/20/2016 0423 11/09/16 0335  WBC 6.6 6.1 6.1  NEUTROABS 4.1  --  5.0  HGB 10.2* 9.5* 8.9*  HCT 29.2* 27.3* 25.7*  MCV 87.7 87.4 88.5  PLT 112* 106* 103*    Cardiac Enzymes:  Recent Labs Lab 11/12/2016 0157  TROPONINI 0.04*    BNP: Invalid input(s): POCBNP  CBG:  Recent Labs Lab 11/23/2016 1629 11/25/2016 1954 12/04/2016 2346 11/09/16 0350 11/09/16 0742  GLUCAP 121* 124* 97 120* 115*    Microbiology: Results for orders placed or performed during the hospital encounter of 11/16/2016  MRSA PCR Screening     Status: None   Collection Time: 12/03/2016  3:41 AM  Result Value Ref Range Status   MRSA by PCR NEGATIVE NEGATIVE Final    Comment:        The GeneXpert MRSA Assay (FDA approved for NASAL specimens only), is one component of a comprehensive MRSA colonization surveillance program. It is not intended to diagnose MRSA infection nor to guide or monitor treatment for MRSA infections.     Coagulation Studies: No results for  input(s): LABPROT, INR in the last 72 hours.  Urinalysis: No results for input(s): COLORURINE, LABSPEC, PHURINE, GLUCOSEU, HGBUR, BILIRUBINUR, KETONESUR, PROTEINUR, UROBILINOGEN, NITRITE, LEUKOCYTESUR in the last 72 hours.  Invalid input(s): APPERANCEUR    Imaging: Ct Head Wo Contrast  Result Date: 12/02/2016 CLINICAL DATA:  Altered level of consciousness.  Seizure. EXAM: CT HEAD WITHOUT CONTRAST TECHNIQUE: Contiguous axial images were obtained from the base of the skull through the vertex without intravenous contrast. COMPARISON:  Brain MRI 04/30/2016, head CT 12/08/2015 FINDINGS: Brain: Symmetric low-density within bilateral posterior parietooccipital lobes. No evidence of acute hemorrhage. No hydrocephalus, midline shift or mass effect. No subdural or extra-axial fluid collection. Vascular:  No hyperdense vessel. Skull: Suggestion  of diffuse sclerosis. Subcentimeter lytic lesion in the high right frontoparietal calvarium is new from prior exam. Subcentimeter lytic lesion in the left frontal bone is also new. Unchanged lytic skullbase lesion. Smaller subcentimeter lesions in the left frontal bone are unchanged from previous. Sinuses/Orbits: Paranasal sinuses and mastoid air cells are clear. The visualized orbits are unremarkable. Other: None. IMPRESSION: 1. Symmetric low-density within posterior parietooccipital lobes suspicious for PRES (posterior reversible encephalopathy syndrome). Consider MRI for further evaluation. 2. Subcentimeter calvarial lesions in right and left frontal bone there are new from October 2017 head CT likely related to myeloma. Electronically Signed   By: Jeb Levering M.D.   On: 11/19/2016 02:25   Mr Brain Wo Contrast  Result Date: 11/09/2016 CLINICAL DATA:  51 y/o  F; seizure episodes with hypertension. EXAM: MRI HEAD WITHOUT CONTRAST TECHNIQUE: Multiplanar, multiecho pulse sequences of the brain and surrounding structures were obtained without intravenous contrast. COMPARISON:  12/04/2016 CT head.  04/30/2016 MRI head. FINDINGS: Brain: T2 FLAIR hyperintense signal abnormality involving cortex and subcortical white matter with a predominantly posterior distribution in the supratentorial brain, typical of PRES. No reduced diffusion to suggest acute or early subacute infarction. No abnormal susceptibility hypointensity to indicate intracranial hemorrhage. There is cortical swelling with mild local mass effect associated with the areas of T2 FLAIR signal abnormality. No effacement of basilar cisterns, herniation, or extra-axial collection. Mild diffuse brain parenchymal volume loss. Vascular: Normal flow voids. 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 Sinuses/Orbits: Negative. Other: None. IMPRESSION: 1. Findings typical of acute  hypertensive encephalopathy, PRES, involving the posterior supratentorial brain. 2. No evidence for acute infarction, hemorrhage, or significant mass effect. 3. Mild brain parenchymal volume loss. Electronically Signed   By: Kristine Garbe M.D.   On: 11/09/2016 03:09   Dg Chest Port 1 View  Result Date: 11/20/2016 CLINICAL DATA:  Cough.  Seizure-like activity. EXAM: PORTABLE CHEST 1 VIEW COMPARISON:  Chest radiograph 05/26/2016. FINDINGS: Tip of the right chest port in the distal SVC. Heart size upper normal. Mediastinal contours are normal. Improved bilateral pleural effusions from prior exam, suspect small volume residual or recurrent pleural fluid. Improved pulmonary edema from prior exam. Bony sclerosis with remote bilateral rib fractures, likely related to myeloma. Lytic lesion in the right clavicle. IMPRESSION: 1. Small pleural effusions, improved from prior exam, may be persistent or recurrent. Improved pulmonary edema, mild residual or recurrent vascular congestion. 2. Bony scleroses and bilateral rib fractures likely related to myeloma. Electronically Signed   By: Jeb Levering M.D.   On: 12/04/2016 02:36     Medications:   . sodium chloride 75 mL/hr at 11/09/16 0717  . sodium chloride    . sodium chloride    . levETIRAcetam Stopped (11/09/16 0515)  .  niCARDipine 4 mg/hr (11/09/16 0915)   . aspirin EC  81 mg Oral Daily  . fentaNYL  25 mcg Transdermal Q72H  . heparin subcutaneous  5,000 Units Subcutaneous Q12H  . insulin aspart  0-15 Units Subcutaneous Q4H  . mouth rinse  15 mL Mouth Rinse BID  . mouth rinse  15 mL Mouth Rinse BID  . metoprolol tartrate  5 mg Intravenous Q6H  . senna  1 tablet Oral Daily  . verapamil  120 mg Oral Daily   sodium chloride, sodium chloride, acetaminophen **OR** acetaminophen, alteplase, heparin, lidocaine (PF), lidocaine-prilocaine, LORazepam, ondansetron **OR** ondansetron (ZOFRAN) IV, oxyCODONE, pentafluoroprop-tetrafluoroeth,  prochlorperazine  Assessment/ Plan:  51 y.o.Hispanic Spanish Speaking only female with diabetes mellitus type II, hypertension, multiple myeloma, ESRD on hemodialysis.   [facility] MWF [address] AVF  1. End Stage Renal Disease: ESRD secondary to multiple myeloma - Hemodialysis treatment yesterday. Tolerated treatment well. UF of 2 liters.   2. Anemia of chronic kidney disease: Hemoglobin 8.9 - EPO with HD treatment  3. 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  - Nicardipine gtt. Weaning today - PRN metoprolol ordered - Home regimen of verapamil.  - Start irbesartan 142m daily.   4. Secondary Hyperparathyroidism:  Calcium elevated at 11.9 on admission.  Secondary to her underlying multiple myeloma. Phosphorus at goal.  - restart low dose cinacalcet. 353mdaily   LOS: 1 Sallyann Kinnaird 9/4/201810:22 AM

## 2016-11-09 NOTE — Progress Notes (Signed)
[facility] at [place] NAME: Jamie Keller    MR#:  [id number]  DATE OF BIRTH:  30-Oct-1965  SUBJECTIVE: sheis much more alert today. No shortness of breath. Son is at the bedside.off the nicardipine drip. BP is better. MRI of the brain did not show any acute stroke.  CHIEF COMPLAINT:   Chief Complaint  Patient presents with  . Seizures   The patient is unresponsive. On hemodialysis. REVIEW OF SYSTEMS:  Review of Systems  Unable to perform ROS: Mental status change  Constitutional: Negative for chills and fever.  HENT: Negative for hearing loss.   Eyes: Negative for blurred vision, double vision and photophobia.  Respiratory: Negative for cough, hemoptysis and shortness of breath.   Cardiovascular: Negative for palpitations, orthopnea and leg swelling.  Gastrointestinal: Negative for abdominal pain, diarrhea and vomiting.  Genitourinary: Negative for dysuria and urgency.  Musculoskeletal: Negative for myalgias and neck pain.  Skin: Negative for rash.  Neurological: Negative for dizziness, focal weakness, seizures, weakness and headaches.  Psychiatric/Behavioral: Negative for memory loss. The patient does not have insomnia.     DRUG ALLERGIES:  No Known Allergies VITALS:  Blood pressure (!) 137/52, pulse (!) 111, temperature 98.3 F (36.8 C), temperature source Oral, resp. rate (!) 22, height 5' (1.524 m), weight 52 kg (114 lb 10.2 oz), last menstrual period 02/25/1995, SpO2 95 %. PHYSICAL EXAMINATION:  Physical Exam  Constitutional:  Unesponsive to verbal stimuli.  HENT:  Head: Normocephalic.  Mouth/Throat: Oropharynx is clear and moist.  Eyes: No scleral icterus.  Neck: Neck supple. No JVD present. No tracheal deviation present.  Cardiovascular: Normal rate, regular rhythm and normal heart sounds.  Exam reveals no gallop.   No murmur heard. Pulmonary/Chest: Effort normal and breath sounds normal. No respiratory  distress. She has no wheezes. She has no rales.  Abdominal: Soft. Bowel sounds are normal. She exhibits no distension. There is no tenderness. There is no rebound.  Musculoskeletal: She exhibits no edema or tenderness.  Neurological: No cranial nerve deficit.  Alert, awake, oriented. Son is at the bedside. Patient denies any complaints. Is eating lunch.  Skin: No rash noted. No erythema.   LABORATORY PANEL:  Female CBC  Recent Labs Lab 11/09/16 0335  WBC 6.1  HGB 8.9*  HCT 25.7*  PLT 103*   ------------------------------------------------------------------------------------------------------------------ Chemistries   Recent Labs Lab 11/24/2016 0157 11/25/2016 0423 11/09/16 0335  NA 134* 133* 140  K 3.7 3.7 3.0*  CL 92* 92* 99*  CO2 24 26 26   GLUCOSE 234* 268* 126*  BUN 52* 54* 21*  CREATININE 6.46* 6.46* 3.49*  CALCIUM 11.9* 11.9* 10.1  MG  --  2.4  --   AST 27  --   --   ALT 9*  --   --   ALKPHOS 143*  --   --   BILITOT 1.2  --   --    RADIOLOGY:  Mr Brain Wo Contrast  Result Date: 11/09/2016 CLINICAL DATA:  51 y/o  F; seizure episodes with hypertension. EXAM: MRI HEAD WITHOUT CONTRAST TECHNIQUE: Multiplanar, multiecho pulse sequences of the brain and surrounding structures were obtained without intravenous contrast. COMPARISON:  11/06/2016 CT head.  04/30/2016 MRI head. FINDINGS: Brain: T2 FLAIR hyperintense signal abnormality involving cortex and subcortical white matter with a predominantly posterior distribution in the supratentorial brain, typical of PRES. No reduced diffusion to suggest acute or early subacute infarction. No abnormal susceptibility hypointensity to indicate intracranial hemorrhage. There is cortical  swelling with mild local mass effect associated with the areas of T2 FLAIR signal abnormality. No effacement of basilar cisterns, herniation, or extra-axial collection. Mild diffuse brain parenchymal volume loss. Vascular: Normal flow voids. Skull and upper  cervical spine: Diffusely reduced bone marrow signal likely representing sequelae of renal osteodystrophy. Sinuses/Orbits: Negative. Other: None. IMPRESSION: 1. Findings typical of acute hypertensive encephalopathy, PRES, involving the posterior supratentorial brain. 2. No evidence for acute infarction, hemorrhage, or significant mass effect. 3. Mild brain parenchymal volume loss. Electronically Signed   By: Kristine Garbe M.D.   On: 11/09/2016 03:09   ASSESSMENT AND PLAN:   IMPRESSION AND PLAN: 51 year old female patient with history of multiple myeloma, end-stage renal disease on dialysis, hypertension presented to the emergency room with seizure and elevated blood pressure.   1. Hypertensive urgency 2. Posterior reversible encephalopathy syndrome;MRI of the brain did not show any acute stroke. Showed only PRES>patient is off nicardipine drip. BP is acceptable.continue verapamil 120 mg by mouth daily, irbesartan 150 mg by mouth daily  3.New onset seizure;on IV  keppra till now because of altered mental status but today she is much better alert, awake, oriented,chnage to po keppra  4. End-stage renal disease on dialysis,nephro is following  History of multiple myeloma;follows up from oncology with Dr. Grayland Ormond.  All the records are reviewed and case discussed with Care Management/Social Worker. Management plans discussed with the patient, family and they are in agreement.  CODE STATUS: Full Code  TOTAL TIME TAKING CARE OF THIS PATIENT: 37 minutes.   More than 50% of the time was spent in counseling/coordination of care: YES  POSSIBLE D/C IN 3 DAYS, DEPENDING ON CLINICAL CONDITION.   Epifanio Lesches M.D on 11/09/2016 at 1:40 PM  Between 7am to 6pm - Pager - [phone]  After 6pm go to [email or web address] - [facility]

## 2016-11-10 ENCOUNTER — Encounter: Payer: Self-pay | Admitting: *Deleted

## 2016-11-10 LAB — CBC
HEMATOCRIT: 24.3 % — AB (ref 35.0–47.0)
Hemoglobin: 8.2 g/dL — ABNORMAL LOW (ref 12.0–16.0)
MCH: 30.1 pg (ref 26.0–34.0)
MCHC: 33.9 g/dL (ref 32.0–36.0)
MCV: 88.8 fL (ref 80.0–100.0)
Platelets: 93 10*3/uL — ABNORMAL LOW (ref 150–440)
RBC: 2.73 MIL/uL — ABNORMAL LOW (ref 3.80–5.20)
RDW: 15.7 % — AB (ref 11.5–14.5)
WBC: 5.4 10*3/uL (ref 3.6–11.0)

## 2016-11-10 LAB — RENAL FUNCTION PANEL
Albumin: 2.4 g/dL — ABNORMAL LOW (ref 3.5–5.0)
Anion gap: 12 (ref 5–15)
BUN: 43 mg/dL — AB (ref 6–20)
CALCIUM: 10.6 mg/dL — AB (ref 8.9–10.3)
CO2: 23 mmol/L (ref 22–32)
CREATININE: 5 mg/dL — AB (ref 0.44–1.00)
Chloride: 101 mmol/L (ref 101–111)
GFR calc Af Amer: 11 mL/min — ABNORMAL LOW (ref >60)
GFR calc non Af Amer: 9 mL/min — ABNORMAL LOW (ref >60)
GLUCOSE: 141 mg/dL — AB (ref 65–99)
PHOSPHORUS: 5.7 mg/dL — AB (ref 2.5–4.6)
Potassium: 3.5 mmol/L (ref 3.5–5.1)
SODIUM: 136 mmol/L (ref 135–145)

## 2016-11-10 LAB — URINALYSIS, COMPLETE (UACMP) WITH MICROSCOPIC
BILIRUBIN URINE: NEGATIVE
GLUCOSE, UA: 50 mg/dL — AB
KETONES UR: 5 mg/dL — AB
NITRITE: NEGATIVE
PROTEIN: 100 mg/dL — AB
Specific Gravity, Urine: 1.008 (ref 1.005–1.030)
pH: 8 (ref 5.0–8.0)

## 2016-11-10 LAB — GLUCOSE, CAPILLARY
GLUCOSE-CAPILLARY: 105 mg/dL — AB (ref 65–99)
GLUCOSE-CAPILLARY: 117 mg/dL — AB (ref 65–99)
Glucose-Capillary: 103 mg/dL — ABNORMAL HIGH (ref 65–99)
Glucose-Capillary: 144 mg/dL — ABNORMAL HIGH (ref 65–99)
Glucose-Capillary: 176 mg/dL — ABNORMAL HIGH (ref 65–99)

## 2016-11-10 LAB — URINE DRUG SCREEN, QUALITATIVE (ARMC ONLY)
AMPHETAMINES, UR SCREEN: NOT DETECTED
BARBITURATES, UR SCREEN: NOT DETECTED
Benzodiazepine, Ur Scrn: NOT DETECTED
COCAINE METABOLITE, UR ~~LOC~~: NOT DETECTED
Cannabinoid 50 Ng, Ur ~~LOC~~: NOT DETECTED
MDMA (ECSTASY) UR SCREEN: NOT DETECTED
METHADONE SCREEN, URINE: NOT DETECTED
OPIATE, UR SCREEN: NOT DETECTED
Phencyclidine (PCP) Ur S: NOT DETECTED
TRICYCLIC, UR SCREEN: NOT DETECTED

## 2016-11-10 LAB — PROCALCITONIN: PROCALCITONIN: 12.38 ng/mL

## 2016-11-10 MED ORDER — LEVETIRACETAM 500 MG PO TABS
500.0000 mg | ORAL_TABLET | Freq: Every day | ORAL | Status: DC
  Administered 2016-11-11 – 2016-11-21 (×11): 500 mg via ORAL
  Filled 2016-11-10 (×11): qty 1

## 2016-11-10 MED ORDER — LEVETIRACETAM 250 MG PO TABS
250.0000 mg | ORAL_TABLET | ORAL | Status: DC
  Administered 2016-11-10 – 2016-11-19 (×4): 250 mg via ORAL
  Filled 2016-11-10 (×5): qty 1

## 2016-11-10 MED ORDER — INSULIN ASPART 100 UNIT/ML ~~LOC~~ SOLN
0.0000 [IU] | Freq: Three times a day (TID) | SUBCUTANEOUS | Status: DC
  Administered 2016-11-10: 2 [IU] via SUBCUTANEOUS
  Administered 2016-11-10: 3 [IU] via SUBCUTANEOUS
  Administered 2016-11-11: 2 [IU] via SUBCUTANEOUS
  Administered 2016-11-11: 3 [IU] via SUBCUTANEOUS
  Administered 2016-11-12 (×2): 2 [IU] via SUBCUTANEOUS
  Administered 2016-11-12: 3 [IU] via SUBCUTANEOUS
  Administered 2016-11-13 – 2016-11-14 (×4): 2 [IU] via SUBCUTANEOUS
  Administered 2016-11-15: 5 [IU] via SUBCUTANEOUS
  Administered 2016-11-16 (×2): 2 [IU] via SUBCUTANEOUS
  Administered 2016-11-18 – 2016-11-19 (×2): 3 [IU] via SUBCUTANEOUS
  Administered 2016-11-20 (×3): 2 [IU] via SUBCUTANEOUS
  Administered 2016-11-20 – 2016-11-21 (×3): 3 [IU] via SUBCUTANEOUS
  Filled 2016-11-10 (×22): qty 1

## 2016-11-10 MED ORDER — ALTEPLASE 2 MG IJ SOLR
2.0000 mg | Freq: Once | INTRAMUSCULAR | Status: AC
  Administered 2016-11-10 (×2): 2 mg
  Filled 2016-11-10: qty 2

## 2016-11-10 MED ORDER — EPOETIN ALFA 10000 UNIT/ML IJ SOLN
10000.0000 [IU] | INTRAMUSCULAR | Status: DC
  Administered 2016-11-10: 10000 [IU] via INTRAVENOUS

## 2016-11-10 NOTE — Progress Notes (Signed)
HD initiated without issue. Patient resting quietly. Currently without complaint.

## 2016-11-10 NOTE — Progress Notes (Signed)
HD completed without issue. No UF as ordered. Patient tolerated well.

## 2016-11-10 NOTE — Progress Notes (Signed)
[facility] at [place] NAME: Jamie Keller    MR#:  [id number]  [place]:  07-03-1965  SUBJECTIVE: Patient is seen during dialysis, little bit more lethargic. No other issues. BP is better. .  CHIEF COMPLAINT:   Chief Complaint  Patient presents with  . Seizures   The patient is unresponsive. On hemodialysis. REVIEW OF SYSTEMS:  Review of Systems  Constitutional: Negative for chills and fever.  HENT: Negative for hearing loss.   Eyes: Negative for blurred vision, double vision and photophobia.  Respiratory: Negative for cough, hemoptysis and shortness of breath.   Cardiovascular: Negative for palpitations, orthopnea and leg swelling.  Gastrointestinal: Negative for abdominal pain, diarrhea and vomiting.  Genitourinary: Negative for dysuria and urgency.  Musculoskeletal: Negative for myalgias and neck pain.  Skin: Negative for rash.  Neurological: Negative for dizziness, focal weakness, seizures, weakness and headaches.  Psychiatric/Behavioral: Negative for memory loss. The patient does not have insomnia.     DRUG ALLERGIES:  No Known Allergies VITALS:  Blood pressure (!) 147/54, pulse 88, temperature 97.8 F (36.6 C), resp. rate 17, height 5' (1.524 m), weight 50.9 kg (112 lb 3.4 oz), last menstrual period 02/25/1995, SpO2 99 %. PHYSICAL EXAMINATION:  Physical Exam  Constitutional:  Unesponsive to verbal stimuli.  HENT:  Head: Normocephalic.  Mouth/Throat: Oropharynx is clear and moist.  Eyes: No scleral icterus.  Neck: Neck supple. No JVD present. No tracheal deviation present.  Cardiovascular: Normal rate, regular rhythm and normal heart sounds.  Exam reveals no gallop.   No murmur heard. Pulmonary/Chest: Effort normal and breath sounds normal. No respiratory distress. She has no wheezes. She has no rales.  Abdominal: Soft. Bowel sounds are normal. She exhibits no distension. There is no tenderness. There is no  rebound.  Musculoskeletal: She exhibits no edema or tenderness.  Neurological: No cranial nerve deficit.  .  Skin: No rash noted. No erythema.   LABORATORY PANEL:  Female CBC  Recent Labs Lab 11/10/16 1000  WBC 5.4  HGB 8.2*  HCT 24.3*  PLT 93*   ------------------------------------------------------------------------------------------------------------------ Chemistries   Recent Labs Lab 11/12/2016 0157 11/06/2016 0423 11/09/16 0335  NA 134* 133* 140  K 3.7 3.7 3.0*  CL 92* 92* 99*  CO2 _0 GLUCOSE 234* 268* 126*  BUN 52* 54* 21*  CREATININE 6.46* 6.46* 3.49*  CALCIUM 11.9* 11.9* 10.1  MG  --  2.4  --   AST 27  --   --   ALT 9*  --   --   ALKPHOS 143*  --   --   BILITOT 1.2  --   --    RADIOLOGY:  No results found. ASSESSMENT AND PLAN:   IMPRESSION AND PLAN: 51 year old female patient with history of multiple myeloma, end-stage renal disease on dialysis, hypertension presented to the emergency room with seizure and elevated blood pressure.   1. Hypertensive urgency 2. Posterior reversible encephalopathy syndrome;MRI of the brain did not show any acute stroke. Showed only PRES>patient is off nicardipine drip. BP is acceptable.continue verapamil 120 mg by mouth daily, irbesartan 150 mg by mouth daily Transfer out of ICU.  3.New onset seizure; change it to by mouth Keppra. Little bit more lethargic. Renally dose the Keppra. Spoke to pharmacy.  4. End-stage renal disease on dialysis,nephro is following  History of multiple myeloma;follows up from oncology with Dr. Grayland Ormond.  Monitored hospital because patient is little bit lethargic than yesterday. Hopefully she'll be  able to go home tomorrow. Discussed with nephrology.. All the records are reviewed and case discussed with Care Management/Social Worker. Management plans discussed with the patient, family and they are in agreement.  CODE STATUS: Full Code  TOTAL TIME TAKING CARE OF THIS PATIENT: 37 minutes.    More than 50% of the time was spent in counseling/coordination of care: YES  POSSIBLE D/C IN 3 DAYS, DEPENDING ON CLINICAL CONDITION.   Epifanio Lesches M.D on 11/10/2016 at 1:48 PM  Between 7am to 6pm - Pager - [phone]  After 6pm go to [email or web address] - [facility]

## 2016-11-10 NOTE — Care Management (Signed)
Patient transferred out of icu. Jamie Keller with [facility] updated. Chronic HD at [facility].  Confirmed that patient has never completed applications for [facility].  Patient admitted with seizures and hypertensive urgency requiring nicardipine drip. Most likely will discharge home on new Keppra.  Have coupon information for [email or web address] for reduced costs.  It is verbally reported that patient has a medicaid application in progress. Patient is followed by [facility] for multiple myeloma

## 2016-11-10 NOTE — Progress Notes (Signed)
Post hd assessment unchanged  

## 2016-11-10 NOTE — Progress Notes (Signed)
[facility]  ROUNDING NOTE   Subjective:   Seen and examined on hemodialysis treatment. Tolerating treatment well. UF even.   History taken with assistance of Spanish interpreter   HEMODIALYSIS FLOWSHEET:  Blood Flow Rate (mL/min): 300 mL/min Arterial Pressure (mmHg): -90 mmHg Venous Pressure (mmHg): 130 mmHg Transmembrane Pressure (mmHg): 60 mmHg Ultrafiltration Rate (mL/min): 170 mL/min Dialysate Flow Rate (mL/min): 800 ml/min Conductivity: Machine : 13.9 Conductivity: Machine : 13.9 Dialysis Fluid Bolus: Normal Saline Bolus Amount (mL): 250 mL Dialysate Change: 4K    Objective:  Vital signs in last 24 hours:  Temp:  [97.4 F (36.3 C)-98.7 F (37.1 C)] 98.1 F (36.7 C) (09/05 0957) Pulse Rate:  [78-90] 88 (09/05 1200) Resp:  [16-24] 21 (09/05 1200) BP: (139-154)/(48-63) 146/59 (09/05 1200) SpO2:  [95 %-100 %] 100 % (09/05 1200) Weight:  [50.9 kg (112 lb 3.2 oz)-50.9 kg (112 lb 3.4 oz)] 50.9 kg (112 lb 3.4 oz) (09/05 0957)  Weight change: -1.106 kg (-2 lb 7 oz) Filed Weights   11/18/2016 1100 11/09/16 2228 11/10/16 0957  Weight: 52 kg (114 lb 10.2 oz) 50.9 kg (112 lb 3.2 oz) 50.9 kg (112 lb 3.4 oz)    Intake/Output: I/O last 3 completed shifts: In: 3800 [P.O.:320; I.V.:3165; IV Piggyback:315] Out: 200 [Urine:200]   Intake/Output this shift:  No intake/output data recorded.  Physical Exam: General: ill appearing   Head: Normocephalic, atraumatic. Moist oral mucosal membranes  Eyes: Anicteric  Neck: Supple, trachea midline  Lungs:  Clear to auscultation, normal effort  Heart: S1S2 no rubs  Abdomen:  Soft, nontender, bowel sounds present  Extremities: No peripheral edema.  Neurologic: Lethargic, alert and oriented x 3   Skin: No lesions  Access: LUE AVF    Basic Metabolic Panel:  Recent Labs Lab 12/03/2016 0157 11/14/2016 0423 11/30/2016 1202 11/09/16 0335  NA 134* 133*  --  140  K 3.7 3.7  --  3.0*  CL 92* 92*  --  99*  CO2 24 26  --   26  GLUCOSE 234* 268*  --  126*  BUN 52* 54*  --  21*  CREATININE 6.46* 6.46*  --  3.49*  CALCIUM 11.9* 11.9*  --  10.1  MG  --  2.4  --   --   PHOS  --  5.8* 2.2*  --     Liver Function Tests:  Recent Labs Lab 11/15/2016 0157  AST 27  ALT 9*  ALKPHOS 143*  BILITOT 1.2  PROT 6.7  ALBUMIN 3.1*   No results for input(s): LIPASE, AMYLASE in the last 168 hours.  Recent Labs Lab 11/24/2016 0223  AMMONIA 34    CBC:  Recent Labs Lab 11/22/2016 0157 11/11/2016 0423 11/09/16 0335 11/10/16 1000  WBC 6.6 6.1 6.1 5.4  NEUTROABS 4.1  --  5.0  --   HGB 10.2* 9.5* 8.9* 8.2*  HCT 29.2* 27.3* 25.7* 24.3*  MCV 87.7 87.4 88.5 88.8  PLT 112* 106* 103* 93*    Cardiac Enzymes:  Recent Labs Lab 11/27/2016 0157  TROPONINI 0.04*    BNP: Invalid input(s): POCBNP  CBG:  Recent Labs Lab 11/09/16 0742 11/09/16 1110 11/09/16 1631 11/09/16 1935 11/10/16 0909  GLUCAP 115* 202* 153* 51* 103*    Microbiology: Results for orders placed or performed during the hospital encounter of 11/07/2016  MRSA PCR Screening     Status: None   Collection Time: 11/30/2016  3:41 AM  Result Value Ref Range Status   MRSA by PCR NEGATIVE  NEGATIVE Final    Comment:        The GeneXpert MRSA Assay (FDA approved for NASAL specimens only), is one component of a comprehensive MRSA colonization surveillance program. It is not intended to diagnose MRSA infection nor to guide or monitor treatment for MRSA infections.     Coagulation Studies: No results for input(s): LABPROT, INR in the last 72 hours.  Urinalysis: No results for input(s): COLORURINE, LABSPEC, PHURINE, GLUCOSEU, HGBUR, BILIRUBINUR, KETONESUR, PROTEINUR, UROBILINOGEN, NITRITE, LEUKOCYTESUR in the last 72 hours.  Invalid input(s): APPERANCEUR    Imaging: Mr Brain Wo Contrast  Result Date: 11/09/2016 CLINICAL DATA:  51 y/o  F; seizure episodes with hypertension. EXAM: MRI HEAD WITHOUT CONTRAST TECHNIQUE: Multiplanar, multiecho  pulse sequences of the brain and surrounding structures were obtained without intravenous contrast. COMPARISON:  12/03/2016 CT head.  04/30/2016 MRI head. FINDINGS: Brain: T2 FLAIR hyperintense signal abnormality involving cortex and subcortical white matter with a predominantly posterior distribution in the supratentorial brain, typical of PRES. No reduced diffusion to suggest acute or early subacute infarction. No abnormal susceptibility hypointensity to indicate intracranial hemorrhage. There is cortical swelling with mild local mass effect associated with the areas of T2 FLAIR signal abnormality. No effacement of basilar cisterns, herniation, or extra-axial collection. Mild diffuse brain parenchymal volume loss. Vascular: Normal flow voids. 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 Sinuses/Orbits: Negative. Other: None. IMPRESSION: 1. Findings typical of acute hypertensive encephalopathy, PRES, involving the posterior supratentorial brain. 2. No evidence for acute infarction, hemorrhage, or significant mass effect. 3. Mild brain parenchymal volume loss. Electronically Signed   By: Kristine Garbe M.D.   On: 11/09/2016 03:09     Medications:   . sodium chloride 75 mL/hr at 11/09/16 0717  . sodium chloride    . sodium chloride    . levETIRAcetam Stopped (11/10/16 0535)   . alteplase  2 mg Intracatheter Once  . aspirin EC  81 mg Oral Daily  . cinacalcet  30 mg Oral Q supper  . epoetin (EPOGEN/PROCRIT) injection  10,000 Units Intravenous Q M,W,F-HD  . fentaNYL  25 mcg Transdermal Q72H  . heparin subcutaneous  5,000 Units Subcutaneous Q12H  . insulin aspart  0-15 Units Subcutaneous TID WC & HS  . irbesartan  150 mg Oral Daily  . mouth rinse  15 mL Mouth Rinse BID  . metoprolol tartrate  5 mg Intravenous Q6H  . senna  1 tablet Oral Daily  . sodium chloride flush  10-40 mL Intracatheter Q12H  . verapamil   120 mg Oral Daily   sodium chloride, sodium chloride, acetaminophen **OR** acetaminophen, alteplase, heparin, lidocaine (PF), lidocaine-prilocaine, LORazepam, ondansetron **OR** ondansetron (ZOFRAN) IV, oxyCODONE, pentafluoroprop-tetrafluoroeth, prochlorperazine, sodium chloride flush  Assessment/ Plan:  Jamie Keller with diabetes mellitus type II, hypertension, multiple myeloma, ESRD on hemodialysis.   [facility] MWF [address] AVF  1. End Stage Renal Disease: ESRD secondary to multiple myeloma 4 K bath for hypokalemia - Hemodialysis treatment today. Tolerating treatment well. UF even - Continue MWF schedule  2. Anemia of chronic kidney disease with multiple myeloma: Hemoglobin 8.2 - EPO with HD treatment  3. Hypertension with posterior reversible encephalopathy syndrome (PRES) on MRI Nicardipine gtt on admission.  - Appreciate neurology input.  - PRN metoprolol ordered - Home regimen of verapamil.  - Started irbesartan 153m daily.   4. Secondary Hyperparathyroidism:  Calcium elevated at 11.9 on admission. Pending today's calcium level.  Secondary to her underlying multiple myeloma. Phosphorus at goal.  - restarted low dose cinacalcet. 64m daily   LOS: 2 Cynthya Yam 9/5/201812:23 PM

## 2016-11-10 NOTE — Progress Notes (Signed)
Pre hd 

## 2016-11-11 ENCOUNTER — Inpatient Hospital Stay: Payer: Medicaid Other

## 2016-11-11 LAB — GLUCOSE, CAPILLARY
GLUCOSE-CAPILLARY: 137 mg/dL — AB (ref 65–99)
GLUCOSE-CAPILLARY: 111 mg/dL — AB (ref 65–99)
Glucose-Capillary: 176 mg/dL — ABNORMAL HIGH (ref 65–99)
Glucose-Capillary: 135 mg/dL — ABNORMAL HIGH (ref 65–99)

## 2016-11-11 LAB — URINE CULTURE

## 2016-11-11 MED ORDER — ALBUTEROL SULFATE (2.5 MG/3ML) 0.083% IN NEBU
2.5000 mg | INHALATION_SOLUTION | RESPIRATORY_TRACT | Status: DC | PRN
  Administered 2016-11-11 – 2016-11-12 (×3): 2.5 mg via RESPIRATORY_TRACT
  Filled 2016-11-11 (×3): qty 3

## 2016-11-11 MED ORDER — ONDANSETRON HCL 4 MG PO TABS: 4.0000 mg | ORAL_TABLET | Freq: Once | ORAL | Status: DC

## 2016-11-11 MED ORDER — METOPROLOL TARTRATE 25 MG PO TABS
12.5000 mg | ORAL_TABLET | Freq: Two times a day (BID) | ORAL | Status: DC
  Administered 2016-11-11 – 2016-11-12 (×3): 12.5 mg via ORAL
  Filled 2016-11-11 (×4): qty 1

## 2016-11-11 MED ORDER — FUROSEMIDE 10 MG/ML IJ SOLN
40.0000 mg | Freq: Once | INTRAMUSCULAR | Status: AC
  Administered 2016-11-11: 40 mg via INTRAVENOUS
  Filled 2016-11-11: qty 4

## 2016-11-11 MED ORDER — LABETALOL HCL 5 MG/ML IV SOLN
10.0000 mg | INTRAVENOUS | Status: DC | PRN
  Administered 2016-11-11 – 2016-11-20 (×6): 10 mg via INTRAVENOUS
  Filled 2016-11-11 (×10): qty 4

## 2016-11-11 MED ORDER — IRBESARTAN 150 MG PO TABS
300.0000 mg | ORAL_TABLET | Freq: Every day | ORAL | Status: DC
  Administered 2016-11-12 – 2016-11-15 (×4): 300 mg via ORAL
  Filled 2016-11-11 (×4): qty 2

## 2016-11-11 MED ORDER — FUROSEMIDE 10 MG/ML IJ SOLN
40.0000 mg | Freq: Once | INTRAMUSCULAR | Status: AC
  Administered 2016-11-11: 40 mg via INTRAVENOUS

## 2016-11-11 MED ORDER — ONDANSETRON HCL 4 MG/2ML IJ SOLN
4.0000 mg | Freq: Once | INTRAMUSCULAR | Status: AC
Start: 2016-11-11 — End: 2016-11-11
  Administered 2016-11-11: 4 mg via INTRAVENOUS

## 2016-11-11 MED ORDER — FUROSEMIDE 10 MG/ML IJ SOLN
INTRAMUSCULAR | Status: AC
  Administered 2016-11-11: 40 mg via INTRAVENOUS
  Filled 2016-11-11: qty 4

## 2016-11-11 NOTE — Progress Notes (Signed)
HD tx ended 

## 2016-11-11 NOTE — Progress Notes (Signed)
Pre HD assessment  

## 2016-11-11 NOTE — Progress Notes (Signed)
HD tx started  

## 2016-11-11 NOTE — Progress Notes (Addendum)
I was asked to evaluate patient due to increased WOB and resp distress. She received lasix earlier today. She had fluids overnight   On my exam patient with RR in 30's sitting up increased WOB 96% 2 L o2 Just received NEB treatment CVS tachy no m/g/r LUNGS crackles b/l  ABD benign LE no edema  CXR pending ECHO pending  A/P 51 y/o f with ESRD on HD got HD yesterday with increased WOB this afternoon with MRI showing PRESS  1. Acute hypoxic resp failure: Needs HD Called Dr Abigail Butts she will undergo HD today 40 mg IV lasix now   2. HTN urgency:increased avapro needs better BP control  3. PRES 4. New onset seizure  Time 28 minutes D/w son at bedside and dr Vianne Bulls

## 2016-11-11 NOTE — Progress Notes (Signed)
Subjective: Patient without complaints.  No further seizures noted. Patient on Keppra  Objective: Current vital signs: BP (!) 182/65 (BP Location: Right Arm)   Pulse 87   Temp 98.1 F (36.7 C) (Oral)   Resp (!) 24   Ht 5' (1.524 m)   Wt 50.9 kg (112 lb 3.4 oz)   LMP 02/25/1995 Comment: Tubal ligation 1996  SpO2 96%   BMI 21.92 kg/m  Vital signs in last 24 hours: Temp:  [97.8 F (36.6 C)-98.3 F (36.8 C)] 98.1 F (36.7 C) (09/06 0800) Pulse Rate:  [85-99] 87 (09/06 0800) Resp:  [17-24] 24 (09/06 0800) BP: (144-188)/(54-72) 182/65 (09/06 0800) SpO2:  [93 %-100 %] 96 % (09/06 0800) Weight:  [50.9 kg (112 lb 3.4 oz)] 50.9 kg (112 lb 3.4 oz) (09/05 1258)  Intake/Output from previous day: 09/05 0701 - 09/06 0700 In: 1317.5 [I.V.:1317.5] Out: 301 [Urine:300; Stool:1] Intake/Output this shift: Total I/O In: 10 [I.V.:10] Out: 101 [Urine:100; Stool:1] Nutritional status: Diet heart healthy/carb modified Room service appropriate? No; Fluid consistency: Thin  Neurologic Exam: Mental Status: Alert.  Follows commands.  Speech fluent. Cranial Nerves: II: Discs flat bilaterally; Visual fields grossly normal, pupils equal, round, reactive to light and accommodation III,IV, VI: ptosis not present, extra-ocular motions intact bilaterally V,VII: mild right facial droop, facial light touch sensation normal bilaterally VIII: hearing normal bilaterally IX,X: gag reflex present XI: bilateral shoulder shrug XII: midline tongue extension Motor: Generalized weakness with no focal weakness noted and patient able to lift all extremities against gravity.     Lab Results: Basic Metabolic Panel:  Recent Labs Lab 11/13/2016 0157 11/18/2016 0423 11/23/2016 1202 11/09/16 0335 11/10/16 1000  NA 134* 133*  --  140 136  K 3.7 3.7  --  3.0* 3.5  CL 92* 92*  --  99* 101  CO2 24 26  --  26 23  GLUCOSE 234* 268*  --  126* 141*  BUN 52* 54*  --  21* 43*  CREATININE 6.46* 6.46*  --  3.49* 5.00*   CALCIUM 11.9* 11.9*  --  10.1 10.6*  MG  --  2.4  --   --   --   PHOS  --  5.8* 2.2*  --  5.7*    Liver Function Tests:  Recent Labs Lab 11/06/2016 0157 11/10/16 1000  AST 27  --   ALT 9*  --   ALKPHOS 143*  --   BILITOT 1.2  --   PROT 6.7  --   ALBUMIN 3.1* 2.4*   No results for input(s): LIPASE, AMYLASE in the last 168 hours.  Recent Labs Lab 11/30/2016 0223  AMMONIA 34    CBC:  Recent Labs Lab 11/13/2016 0157 11/23/2016 0423 11/09/16 0335 11/10/16 1000  WBC 6.6 6.1 6.1 5.4  NEUTROABS 4.1  --  5.0  --   HGB 10.2* 9.5* 8.9* 8.2*  HCT 29.2* 27.3* 25.7* 24.3*  MCV 87.7 87.4 88.5 88.8  PLT 112* 106* 103* 93*    Cardiac Enzymes:  Recent Labs Lab 11/24/2016 0157  TROPONINI 0.04*    Lipid Panel: No results for input(s): CHOL, TRIG, HDL, CHOLHDL, VLDL, LDLCALC in the last 168 hours.  CBG:  Recent Labs Lab 11/10/16 1340 11/10/16 1455 11/10/16 1625 11/10/16 2033 11/11/16 0710  GLUCAP 105* 144* 176* 117* 137*    Microbiology: Results for orders placed or performed during the hospital encounter of 11/13/2016  MRSA PCR Screening     Status: None   Collection Time: 11/27/2016  3:41  AM  Result Value Ref Range Status   MRSA by PCR NEGATIVE NEGATIVE Final    Comment:        The GeneXpert MRSA Assay (FDA approved for NASAL specimens only), is one component of a comprehensive MRSA colonization surveillance program. It is not intended to diagnose MRSA infection nor to guide or monitor treatment for MRSA infections.   Urine Culture     Status: Abnormal   Collection Time: 11/10/16  2:19 AM  Result Value Ref Range Status   Specimen Description URINE, RANDOM  Final   Special Requests NONE  Final   Culture MULTIPLE SPECIES PRESENT, SUGGEST RECOLLECTION (A)  Final   Report Status 11/11/2016 FINAL  Final    Coagulation Studies: No results for input(s): LABPROT, INR in the last 72 hours.  Imaging: No results found.  Medications:  I have reviewed the  patient's current medications. Scheduled: . aspirin EC  81 mg Oral Daily  . cinacalcet  30 mg Oral Q supper  . epoetin (EPOGEN/PROCRIT) injection  10,000 Units Intravenous Q M,W,F-HD  . fentaNYL  25 mcg Transdermal Q72H  . heparin subcutaneous  5,000 Units Subcutaneous Q12H  . insulin aspart  0-15 Units Subcutaneous TID WC & HS  . irbesartan  150 mg Oral Daily  . levETIRAcetam  500 mg Oral Daily   And  . levETIRAcetam  250 mg Oral Once per day on Mon Wed Fri  . mouth rinse  15 mL Mouth Rinse BID  . metoprolol tartrate  12.5 mg Oral BID  . ondansetron  4 mg Oral Once  . senna  1 tablet Oral Daily  . sodium chloride flush  10-40 mL Intracatheter Q12H  . verapamil  120 mg Oral Daily    Assessment/Plan: No further seizures noted. Patient on Keppra.  Blood pressures trending upward with SBP>180.  Recommendations: 1.  BP control with SBP<160 2.  Continue Keppra at current dose   LOS: 3 days   Alexis Goodell, MD Neurology [phone] 11/11/2016  10:54 AM

## 2016-11-11 NOTE — Progress Notes (Addendum)
[facility]  ROUNDING NOTE   Subjective:   Hemodialysis treatment yesterday. UF of 0  Was given IV NS over night.   History taken with assistance of Spanish Interpreter.   Complains of nausea and wheezing.   Objective:  Vital signs in last 24 hours:  Temp:  [98.1 F (36.7 C)-98.3 F (36.8 C)] 98.2 F (36.8 C) (09/06 1211) Pulse Rate:  [87-99] 89 (09/06 1211) Resp:  [17-24] 20 (09/06 1211) BP: (154-188)/(58-72) 160/63 (09/06 1211) SpO2:  [93 %-98 %] 95 % (09/06 1211)  Weight change: 0.007 kg (0.2 oz) Filed Weights   11/09/16 2228 11/10/16 0957 11/10/16 1258  Weight: 50.9 kg (112 lb 3.2 oz) 50.9 kg (112 lb 3.4 oz) 50.9 kg (112 lb 3.4 oz)    Intake/Output: I/O last 3 completed shifts: In: 2592.5 [I.V.:2592.5] Out: 2 [Urine:500; Stool:1]   Intake/Output this shift:  Total I/O In: 10 [I.V.:10] Out: 101 [Urine:100; Stool:1]  Physical Exam: General: ill appearing   Head: Normocephalic, atraumatic. Moist oral mucosal membranes  Eyes: Anicteric  Neck: Supple, trachea midline  Lungs:  Clear to auscultation, normal effort  Heart: S1S2 no rubs  Abdomen:  Soft, nontender, bowel sounds present  Extremities: No peripheral edema.  Neurologic: Lethargic, alert and oriented x 3   Skin: No lesions  Access: LUE AVF    Basic Metabolic Panel:  Recent Labs Lab 11/24/2016 0157 11/10/2016 0423 11/26/2016 1202 11/09/16 0335 11/10/16 1000  NA 134* 133*  --  140 136  K 3.7 3.7  --  3.0* 3.5  CL 92* 92*  --  99* 101  CO2 24 26  --  26 23  GLUCOSE 234* 268*  --  126* 141*  BUN 52* 54*  --  21* 43*  CREATININE 6.46* 6.46*  --  3.49* 5.00*  CALCIUM 11.9* 11.9*  --  10.1 10.6*  MG  --  2.4  --   --   --   PHOS  --  5.8* 2.2*  --  5.7*    Liver Function Tests:  Recent Labs Lab 11/26/2016 0157 11/10/16 1000  AST 27  --   ALT 9*  --   ALKPHOS 143*  --   BILITOT 1.2  --   PROT 6.7  --   ALBUMIN 3.1* 2.4*   No results for input(s): LIPASE, AMYLASE in the last  168 hours.  Recent Labs Lab 11/20/2016 0223  AMMONIA 34    CBC:  Recent Labs Lab 11/10/2016 0157 11/10/2016 0423 11/09/16 0335 11/10/16 1000  WBC 6.6 6.1 6.1 5.4  NEUTROABS 4.1  --  5.0  --   HGB 10.2* 9.5* 8.9* 8.2*  HCT 29.2* 27.3* 25.7* 24.3*  MCV 87.7 87.4 88.5 88.8  PLT 112* 106* 103* 93*    Cardiac Enzymes:  Recent Labs Lab 11/13/2016 0157  TROPONINI 0.04*    BNP: Invalid input(s): POCBNP  CBG:  Recent Labs Lab 11/10/16 1455 11/10/16 1625 11/10/16 2033 11/11/16 0710 11/11/16 1109  GLUCAP 144* 176* 117* 61* 176*    Microbiology: Results for orders placed or performed during the hospital encounter of 11/15/2016  MRSA PCR Screening     Status: None   Collection Time: 11/18/2016  3:41 AM  Result Value Ref Range Status   MRSA by PCR NEGATIVE NEGATIVE Final    Comment:        The GeneXpert MRSA Assay (FDA approved for NASAL specimens only), is one component of a comprehensive MRSA colonization surveillance program. It is not intended to  diagnose MRSA infection nor to guide or monitor treatment for MRSA infections.   Urine Culture     Status: Abnormal   Collection Time: 11/10/16  2:19 AM  Result Value Ref Range Status   Specimen Description URINE, RANDOM  Final   Special Requests NONE  Final   Culture MULTIPLE SPECIES PRESENT, SUGGEST RECOLLECTION (A)  Final   Report Status 11/11/2016 FINAL  Final    Coagulation Studies: No results for input(s): LABPROT, INR in the last 72 hours.  Urinalysis:  Recent Labs  11/10/16 0219  COLORURINE YELLOW*  LABSPEC 1.008  PHURINE 8.0  GLUCOSEU 50*  HGBUR SMALL*  BILIRUBINUR NEGATIVE  KETONESUR 5*  PROTEINUR 100*  NITRITE NEGATIVE  LEUKOCYTESUR LARGE*      Imaging: No results found.   Medications:    . aspirin EC  81 mg Oral Daily  . cinacalcet  30 mg Oral Q supper  . epoetin (EPOGEN/PROCRIT) injection  10,000 Units Intravenous Q M,W,F-HD  . fentaNYL  25 mcg Transdermal Q72H  . heparin  subcutaneous  5,000 Units Subcutaneous Q12H  . insulin aspart  0-15 Units Subcutaneous TID WC & HS  . irbesartan  150 mg Oral Daily  . levETIRAcetam  500 mg Oral Daily   And  . levETIRAcetam  250 mg Oral Once per day on Mon Wed Fri  . mouth rinse  15 mL Mouth Rinse BID  . metoprolol tartrate  12.5 mg Oral BID  . ondansetron  4 mg Oral Once  . senna  1 tablet Oral Daily  . sodium chloride flush  10-40 mL Intracatheter Q12H  . verapamil  120 mg Oral Daily   acetaminophen **OR** acetaminophen, albuterol, alteplase, heparin, labetalol, lidocaine (PF), lidocaine-prilocaine, LORazepam, ondansetron **OR** ondansetron (ZOFRAN) IV, oxyCODONE, pentafluoroprop-tetrafluoroeth, prochlorperazine, sodium chloride flush  Assessment/ Plan:  51 y.o.Hispanic Spanish Speaking only female with diabetes mellitus type II, hypertension, multiple myeloma, ESRD on hemodialysis.   [facility] MWF [address] AVF  1. End Stage Renal Disease: ESRD secondary to multiple myeloma Hemodialysis treatment yesterday. Tolerated treatment.  - Continue MWF schedule - IV furosemide x 1 due to iatrogenic fluid overload.   2. Anemia of chronic kidney disease with multiple myeloma: Hemoglobin 8.2 - EPO with HD treatment  3. Hypertension with posterior reversible encephalopathy syndrome (PRES) on MRI Nicardipine gtt on admission.  - Home regimen of verapamil.  - Started irbesartan 129m daily.   4. Secondary Hyperparathyroidism:  Calcium elevated at 11.9 on admission. Secondary to her underlying multiple myeloma. Phosphorus at goal.  - restarted low dose cinacalcet. 339mdaily   LOS: 3 Safina Huard 9/6/20183:00 PM   Addendum at 14:30  Called due to patient having increasing shortness of breath and no improvement in clinical status since IV furosemide given at 0950. Assessed patient who is tachypnic, tachycardic and using accessory muscles to breath.  Son at bedside.   - Will do an emergent hemodialysis  treatment tonight. 2 hours UF goal of 2 liters.  Orders prepared.  Evaluate daily for dialysis need.   KOLavonia Dana

## 2016-11-11 NOTE — Progress Notes (Signed)
Post HD  

## 2016-11-11 NOTE — Progress Notes (Signed)
[facility] [place] NAME: Jamie Keller    MR#:  [id number]  [place]:  [birth year]  Blood pressure was high this morning, it was 188/72. Patient received IV Lasix, IV labetalol.repeat blood pressure 160/73. Patient complains of shortness of breath. O2 saturation 95% on room air. Has some nausea but no vomiting. Son at bedside.  CHIEF COMbloINT:   Chief Complaint  Patient presents with  . Seizures   The patient is unresponsive. On hemodialysis. REVIEW OF SYSTEMS:  Review of Systems  Constitutional: Negative for chills and fever.  HENT: Negative for hearing loss.   Eyes: Negative for blurred vision, double vision and photophobia.  Respiratory: Positive for shortness of breath. Negative for cough and hemoptysis.   Cardiovascular: Negative for palpitations, orthopnea and leg swelling.  Gastrointestinal: Negative for abdominal pain, diarrhea and vomiting.  Genitourinary: Negative for dysuria and urgency.  Musculoskeletal: Negative for myalgias and neck pain.  Skin: Negative for rash.  Neurological: Negative for dizziness, focal weakness, seizures, weakness and headaches.  Psychiatric/Behavioral: Negative for memory loss. The patient does not have insomnia.     DRUG ALLERGIES:  No Known Allergies VITALS:  Blood pressure (!) 160/63, pulse 89, temperature 98.2 F (36.8 C), temperature source Oral, resp. rate 20, height 5' (1.524 m), weight 50.9 kg (112 lb 3.4 oz), last menstrual period 02/25/1995, SpO2 95 %. PHYSICAL EXAMINATION:  Physical Exam  Constitutional:  Unesponsive to verbal stimuli.  HENT:  Head: Normocephalic.  Mouth/Throat: Oropharynx is clear and moist.  Eyes: No scleral icterus.  Neck: Neck supple. No JVD present. No tracheal deviation present.  Cardiovascular: Normal rate, regular rhythm and normal heart sounds.  Exam reveals no gallop.   No murmur heard. Pulmonary/Chest: Effort normal and breath sounds  normal. No respiratory distress. She has no wheezes. She has no rales.  Abdominal: Soft. Bowel sounds are normal. She exhibits no distension. There is no tenderness. There is no rebound.  Musculoskeletal: She exhibits no edema or tenderness.  Neurological: No cranial nerve deficit.  .  Skin: No rash noted. No erythema.   LABORATORY PANEL:  Female CBC  Recent Labs Lab 11/10/16 1000  WBC 5.4  HGB 8.2*  HCT 24.3*  PLT 93*   ------------------------------------------------------------------------------------------------------------------ Chemistries   Recent Labs Lab 11/10/2016 0157 11/14/2016 0423  11/10/16 1000  NA 134* 133*  < > 136  K 3.7 3.7  < > 3.5  CL 92* 92*  < > 101  CO2 24 26  < > 23  GLUCOSE 234* 268*  < > 141*  BUN 52* 54*  < > 43*  CREATININE 6.46* 6.46*  < > 5.00*  CALCIUM 11.9* 11.9*  < > 10.6*  MG  --  2.4  --   --   AST 27  --   --   --   ALT 9*  --   --   --   ALKPHOS 143*  --   --   --   BILITOT 1.2  --   --   --   < > = values in this interval not displayed. RADIOLOGY:  No results found. ASSESSMENT AND PLAN:   IMPRESSION AND PLAN: 51 year old female patient with history of multiple myeloma, end-stage renal disease on dialysis, hypertension presented to the emergency room with seizure and elevated blood pressure.   1. Hypertensive urgency 2. Posterior reversible encephalopathy syndrome;MRI of the brain did not show any acute stroke. Showed only PRES>patient is off nicardipine  drip. Transferred out of ICU.off BP high this morning and patient also has shortness of breath. Received IV labetalol, Lasix..patient received IV normal saline last night. On verapamil, metoprolol tartrate,for the blood pressure.  3.New onset seizure; changed it to by mouth Keppra. Little bit more lethargic. Renally dose the Keppra. Spoke to pharmacy.  4. End-stage renal disease on dialysis,nephro is following  History of multiple myeloma;follows up from oncology with Dr.  Grayland Ormond.  because of episode of shortness of breath, elevated that pressure, nausea and needs to stay in the hospital for close monitoring due to her multiple medical problems of multiple myeloma, ESRD, new onset seizures. Discussed with son. . All the records are reviewed and case discussed with Care Management/Social Worker. Management plans discussed with the patient, family and they are in agreement.  CODE STATUS: Full Code  TOTAL TIME TAKING CARE OF THIS PATIENT: 37 minutes.   More than 50% of the time was spent in counseling/coordination of care: YES  POSSIBLE D/C IN 3 DAYS, DEPENDING ON CLINICAL CONDITION.   Epifanio Lesches M.D on 11/11/2016 at 12:43 PM  Between 7am to 6pm - Pager - [phone]  After 6pm go to [email or web address] - [facility]

## 2016-11-11 NOTE — Progress Notes (Signed)
PRE HD   

## 2016-11-12 LAB — GLUCOSE, CAPILLARY
GLUCOSE-CAPILLARY: 132 mg/dL — AB (ref 65–99)
GLUCOSE-CAPILLARY: 161 mg/dL — AB (ref 65–99)
GLUCOSE-CAPILLARY: 126 mg/dL — AB (ref 65–99)

## 2016-11-12 LAB — BASIC METABOLIC PANEL WITH GFR
Anion gap: 15 (ref 5–15)
BUN: 24 mg/dL — ABNORMAL HIGH (ref 6–20)
CO2: 28 mmol/L (ref 22–32)
Calcium: 11.1 mg/dL — ABNORMAL HIGH (ref 8.9–10.3)
Chloride: 95 mmol/L — ABNORMAL LOW (ref 101–111)
Creatinine, Ser: 2.9 mg/dL — ABNORMAL HIGH (ref 0.44–1.00)
GFR calc Af Amer: 20 mL/min — ABNORMAL LOW (ref >60)
GFR calc non Af Amer: 18 mL/min — ABNORMAL LOW (ref >60)
Glucose, Bld: 124 mg/dL — ABNORMAL HIGH (ref 65–99)
Potassium: 3.6 mmol/L (ref 3.5–5.1)
Sodium: 138 mmol/L (ref 135–145)

## 2016-11-12 LAB — CBC
HCT: 25.1 % — ABNORMAL LOW (ref 35.0–47.0)
HEMOGLOBIN: 8.6 g/dL — AB (ref 12.0–16.0)
MCH: 30.7 pg (ref 26.0–34.0)
MCHC: 34.4 g/dL (ref 32.0–36.0)
MCV: 89.3 fL (ref 80.0–100.0)
Platelets: 80 10*3/uL — ABNORMAL LOW (ref 150–440)
RBC: 2.81 MIL/uL — AB (ref 3.80–5.20)
RDW: 15.2 % — ABNORMAL HIGH (ref 11.5–14.5)
WBC: 5.3 10*3/uL (ref 3.6–11.0)

## 2016-11-12 MED ORDER — METOPROLOL TARTRATE 25 MG PO TABS
25.0000 mg | ORAL_TABLET | Freq: Two times a day (BID) | ORAL | Status: DC
  Administered 2016-11-12 – 2016-11-13 (×2): 25 mg via ORAL
  Filled 2016-11-12 (×2): qty 1

## 2016-11-12 MED ORDER — HOME MED STORE IN PYXIS: 1.0000 | Freq: Once | Status: DC

## 2016-11-12 MED ORDER — HYDRALAZINE HCL 25 MG PO TABS
25.0000 mg | ORAL_TABLET | Freq: Three times a day (TID) | ORAL | Status: DC
  Administered 2016-11-12 – 2016-11-13 (×2): 25 mg via ORAL
  Filled 2016-11-12 (×2): qty 1

## 2016-11-12 MED ORDER — OXYCODONE HCL 5 MG PO TABS
10.0000 mg | ORAL_TABLET | ORAL | Status: DC | PRN
  Administered 2016-11-12 – 2016-11-20 (×8): 10 mg via ORAL
  Filled 2016-11-12 (×10): qty 2

## 2016-11-12 MED ORDER — IXAZOMIB CITRATE 3 MG PO CAPS
3.0000 mg | ORAL_CAPSULE | ORAL | Status: DC
  Administered 2016-11-12: 3 mg via ORAL
  Filled 2016-11-12: qty 1

## 2016-11-12 NOTE — Progress Notes (Signed)
Pre hd info 

## 2016-11-12 NOTE — Progress Notes (Signed)
Post hd assessment 

## 2016-11-12 NOTE — Care Management (Signed)
Updated Jamie Keller with [facility] on patient status.  Have placed a Good Rx coupon in the paper  chart for keppra 500mg  and irbesartan good for Jamie Keller as these will be new meds.  Patient has been referred to [facility] and applications have never been completed.

## 2016-11-12 NOTE — Progress Notes (Signed)
Hd end, pt alert, no c/o, stable, report to primary rn

## 2016-11-12 NOTE — Progress Notes (Signed)
Post hd vitals 

## 2016-11-12 NOTE — Progress Notes (Signed)
[facility] [facility] [place] NAME: Jamie Keller    MR#:  [id number]  Enlow:  [birth year]  And had shortness of breath yesterday, received emergency hemodialysis because of fluid overload. Remote 2 L of fluid yesterday. Today she feels little better but BP is still high. Scheduled for hemodialysis later today. Still has some nausea. By mouth intake is poor.   CHIEF COMbloINT:   Chief Complaint  Patient presents with  . Seizures   . On hemodialysis. REVIEW OF SYSTEMS:  Review of Systems  Constitutional: Negative for chills and fever.  HENT: Negative for hearing loss.   Eyes: Negative for blurred vision, double vision and photophobia.  Respiratory: Positive for shortness of breath. Negative for cough and hemoptysis.   Cardiovascular: Negative for palpitations, orthopnea and leg swelling.  Gastrointestinal: Negative for abdominal pain, diarrhea and vomiting.  Genitourinary: Negative for dysuria and urgency.  Musculoskeletal: Negative for myalgias and neck pain.  Skin: Negative for rash.  Neurological: Negative for dizziness, focal weakness, seizures, weakness and headaches.  Psychiatric/Behavioral: Negative for memory loss. The patient does not have insomnia.     DRUG ALLERGIES:  No Known Allergies VITALS:  Blood pressure (!) 183/62, pulse 96, temperature 98.3 F (36.8 C), temperature source Oral, resp. rate 20, height 5' (1.524 m), weight 50.9 kg (112 lb 3.4 oz), last menstrual period 02/25/1995, SpO2 94 %. PHYSICAL EXAMINATION:  Physical Exam  Constitutional:  Alert, awake, oriented.  HENT:  Head: Normocephalic.  Mouth/Throat: Oropharynx is clear and moist.  Eyes: No scleral icterus.  Neck: Neck supple. No JVD present. No tracheal deviation present.  Cardiovascular: Normal rate, regular rhythm and normal heart sounds.  Exam reveals no gallop.   No murmur heard. Pulmonary/Chest: Effort normal and breath sounds normal. No  respiratory distress. She has no wheezes. She has no rales.  Abdominal: Soft. Bowel sounds are normal. She exhibits no distension. There is no tenderness. There is no rebound.  Musculoskeletal: She exhibits no edema or tenderness.  Neurological: No cranial nerve deficit.  .  Skin: No rash noted. No erythema.   LABORATORY PANEL:  Female CBC  Recent Labs Lab 11/12/16 0429  WBC 5.3  HGB 8.6*  HCT 25.1*  PLT 80*   ------------------------------------------------------------------------------------------------------------------ Chemistries   Recent Labs Lab 12/03/2016 0157 11/18/2016 0423  11/12/16 0429  NA 134* 133*  < > 138  K 3.7 3.7  < > 3.6  CL 92* 92*  < > 95*  CO2 24 26  < > 28  GLUCOSE 234* 268*  < > 124*  BUN 52* 54*  < > 24*  CREATININE 6.46* 6.46*  < > 2.90*  CALCIUM 11.9* 11.9*  < > 11.1*  MG  --  2.4  --   --   AST 27  --   --   --   ALT 9*  --   --   --   ALKPHOS 143*  --   --   --   BILITOT 1.2  --   --   --   < > = values in this interval not displayed. RADIOLOGY:  Dg Chest Port 1 View  Result Date: 11/11/2016 CLINICAL DATA:  Shortness of breath. EXAM: PORTABLE CHEST 1 VIEW COMPARISON:  November 08, 2016 FINDINGS: The distal tip of the right Port-A-Cath terminates near the caval atrial junction. Increasing bilateral pulmonary opacities and probable effusions, left greater than right are identified. No pneumothorax. No other changes. IMPRESSION: Suspected developing  effusions and edema. An infectious etiology is considered less likely. Electronically Signed   By: Dorise Bullion III M.D   On: 11/11/2016 16:18   ASSESSMENT AND PLAN:   IMPRESSION AND PLAN: 51 year old female patient with history of multiple myeloma, end-stage renal disease on dialysis, hypertension presented to the emergency room with seizure and elevated blood pressure.   1. Hypertensive urgency 2. Posterior reversible encephalopathy syndrome;MRI of the brain did not show any acute stroke.  Showed only PRES>patient is off nicardipine drip. Transferred out of ICU.off Patient had respiratory distress and shortness of breath and received emergency hemodialysis yesterday and she will get another hemodialysis today. Blood pressure is still high so increased the dose of metoprolol to 25 mg twice a day, added hydralazine 25 mg 3 times a day.  3.New onset seizure; changed it to by mouth Keppra.  Renally dose the Keppra. Spoke to pharmacy.  4. End-stage renal disease on dialysis,nephro is following  History of multiple myeloma;follows up from oncology with Dr. Grayland Ormond. Patient is on fentanyl patch for the pain control.on  ixazonib .  because of episode of shortness of breath, elevated t  Prognosis really poor and high risk for cardiac arrest.. . All the records are reviewed and case discussed with Care Management/Social Worker. Management plans discussed with the patient, family and they are in agreement.  CODE STATUS: Full Code  TOTAL TIME TAKING CARE OF THIS PATIENT: 37 minutes.   More than 50% of the time was spent in counseling/coordination of care: YES  POSSIBLE D/C IN 3 DAYS, DEPENDING ON CLINICAL CONDITION.   Epifanio Lesches M.D on 11/12/2016 at 11:57 AM  Between 7am to 6pm - Pager - [phone]  After 6pm go to [email or web address] - [facility]

## 2016-11-12 NOTE — Progress Notes (Signed)
Pre hd assessment  

## 2016-11-12 NOTE — Progress Notes (Signed)
Hd start, alert, stable, restless but no c/o, interpreter/language line used

## 2016-11-12 NOTE — Progress Notes (Signed)
[facility]  ROUNDING NOTE   Subjective:   Emergent hemodialysis last night. UF 2 litres.   Continues to have nausea/vomiting and epistaxis  History taken with assistance of Spanish Interpreter.   Objective:  Vital signs in last 24 hours:  Temp:  [98 F (36.7 C)-98.4 F (36.9 C)] 98.3 F (36.8 C) (09/07 0825) Pulse Rate:  [84-108] 96 (09/07 1000) Resp:  [20-24] 20 (09/07 0825) BP: (160-204)/(61-81) 183/62 (09/07 1000) SpO2:  [91 %-99 %] 94 % (09/07 0825) Weight:  [50.9 kg (112 lb 3.4 oz)] 50.9 kg (112 lb 3.4 oz) (09/06 1712)  Weight change: 0 kg (0 lb) Filed Weights   11/10/16 0957 11/10/16 1258 11/11/16 1712  Weight: 50.9 kg (112 lb 3.4 oz) 50.9 kg (112 lb 3.4 oz) 50.9 kg (112 lb 3.4 oz)    Intake/Output: I/O last 3 completed shifts: In: 727.5 [I.V.:727.5] Out: 2651 [Urine:300; Emesis/NG output:350; Other:2000; Stool:1]   Intake/Output this shift:  Total I/O In: 10 [I.V.:10] Out: -   Physical Exam: General: ill appearing   Head: Normocephalic, atraumatic. Moist oral mucosal membranes  Eyes: Anicteric  Neck: Supple, trachea midline  Lungs:  Bilateral crackles  Heart: S1S2 no rubs  Abdomen:  Soft, nontender, bowel sounds present  Extremities: No peripheral edema.  Neurologic: Lethargic, alert and oriented x 3   Skin: No lesions  Access: LUE AVF    Basic Metabolic Panel:  Recent Labs Lab 12/02/2016 0157 11/29/2016 0423 11/24/2016 1202 11/09/16 0335 11/10/16 1000 11/12/16 0429  NA 134* 133*  --  140 136 138  K 3.7 3.7  --  3.0* 3.5 3.6  CL 92* 92*  --  99* 101 95*  CO2 24 26  --  _0 GLUCOSE 234* 268*  --  126* 141* 124*  BUN 52* 54*  --  21* 43* 24*  CREATININE 6.46* 6.46*  --  3.49* 5.00* 2.90*  CALCIUM 11.9* 11.9*  --  10.1 10.6* 11.1*  MG  --  2.4  --   --   --   --   PHOS  --  5.8* 2.2*  --  5.7*  --     Liver Function Tests:  Recent Labs Lab 11/24/2016 0157 11/10/16 1000  AST 27  --   ALT 9*  --   ALKPHOS 143*  --    BILITOT 1.2  --   PROT 6.7  --   ALBUMIN 3.1* 2.4*   No results for input(s): LIPASE, AMYLASE in the last 168 hours.  Recent Labs Lab 12/04/2016 0223  AMMONIA 34    CBC:  Recent Labs Lab 11/30/2016 0157 12/01/2016 0423 11/09/16 0335 11/10/16 1000  WBC 6.6 6.1 6.1 5.4  NEUTROABS 4.1  --  5.0  --   HGB 10.2* 9.5* 8.9* 8.2*  HCT 29.2* 27.3* 25.7* 24.3*  MCV 87.7 87.4 88.5 88.8  PLT 112* 106* 103* 93*    Cardiac Enzymes:  Recent Labs Lab 11/22/2016 0157  TROPONINI 0.04*    BNP: Invalid input(s): POCBNP  CBG:  Recent Labs Lab 11/11/16 0710 11/11/16 1109 11/11/16 1550 11/11/16 2119 11/12/16 0730  GLUCAP 137* 176* 135* 111* 132*    Microbiology: Results for orders placed or performed during the hospital encounter of 11/22/2016  MRSA PCR Screening     Status: None   Collection Time: 11/23/2016  3:41 AM  Result Value Ref Range Status   MRSA by PCR NEGATIVE NEGATIVE Final    Comment:  The GeneXpert MRSA Assay (FDA approved for NASAL specimens only), is one component of a comprehensive MRSA colonization surveillance program. It is not intended to diagnose MRSA infection nor to guide or monitor treatment for MRSA infections.   Urine Culture     Status: Abnormal   Collection Time: 11/10/16  2:19 AM  Result Value Ref Range Status   Specimen Description URINE, RANDOM  Final   Special Requests NONE  Final   Culture MULTIPLE SPECIES PRESENT, SUGGEST RECOLLECTION (A)  Final   Report Status 11/11/2016 FINAL  Final    Coagulation Studies: No results for input(s): LABPROT, INR in the last 72 hours.  Urinalysis:  Recent Labs  11/10/16 0219  COLORURINE YELLOW*  LABSPEC 1.008  PHURINE 8.0  GLUCOSEU 50*  HGBUR SMALL*  BILIRUBINUR NEGATIVE  KETONESUR 5*  PROTEINUR 100*  NITRITE NEGATIVE  LEUKOCYTESUR LARGE*      Imaging: Dg Chest Port 1 View  Result Date: 11/11/2016 CLINICAL DATA:  Shortness of breath. EXAM: PORTABLE CHEST 1 VIEW COMPARISON:   November 08, 2016 FINDINGS: The distal tip of the right Port-A-Cath terminates near the caval atrial junction. Increasing bilateral pulmonary opacities and probable effusions, left greater than right are identified. No pneumothorax. No other changes. IMPRESSION: Suspected developing effusions and edema. An infectious etiology is considered less likely. Electronically Signed   By: Dorise Bullion III M.D   On: 11/11/2016 16:18     Medications:    . aspirin EC  81 mg Oral Daily  . cinacalcet  30 mg Oral Q supper  . fentaNYL  25 mcg Transdermal Q72H  . heparin subcutaneous  5,000 Units Subcutaneous Q12H  . insulin aspart  0-15 Units Subcutaneous TID WC & HS  . irbesartan  300 mg Oral Daily  . ixazomib citrate  3 mg Oral Weekly  . levETIRAcetam  500 mg Oral Daily   And  . levETIRAcetam  250 mg Oral Once per day on Mon Wed Fri  . mouth rinse  15 mL Mouth Rinse BID  . metoprolol tartrate  12.5 mg Oral BID  . ondansetron  4 mg Oral Once  . senna  1 tablet Oral Daily  . sodium chloride flush  10-40 mL Intracatheter Q12H  . verapamil  120 mg Oral Daily   acetaminophen **OR** acetaminophen, albuterol, labetalol, LORazepam, ondansetron **OR** ondansetron (ZOFRAN) IV, oxyCODONE, prochlorperazine, sodium chloride flush  Assessment/ Plan:  51 y.o.Hispanic Spanish Speaking only female with diabetes mellitus type II, hypertension, multiple myeloma, ESRD on hemodialysis.   [facility] MWF [address] AVF  1. End Stage Renal Disease: ESRD secondary to multiple myeloma Hemodialysis treatment yesterday emergently for fluid overload. Tolerated treatment. UF 2 liters - Continue MWF schedule. Dialysis for later today. Orders prepared.   2. Anemia of chronic kidney disease with multiple myeloma: Hemoglobin 8.2 - EPO with HD treatment  3. Hypertension with posterior reversible encephalopathy syndrome (PRES) on MRI Nicardipine gtt on admission.  - Home regimen of verapamil.  - Started irbesartan  on this admission.   4. Secondary Hyperparathyroidism:  Calcium elevated at 11.9 on admission. Calcium elevated 11.1 Secondary to her underlying multiple myeloma. Phosphorus at goal.  - restarted low dose cinacalcet. 48m daily on this admission.    LOS: 4Benzie Wilson Sample 9/7/201810:41 AM

## 2016-11-13 LAB — GLUCOSE, CAPILLARY
Glucose-Capillary: 124 mg/dL — ABNORMAL HIGH (ref 65–99)
Glucose-Capillary: 148 mg/dL — ABNORMAL HIGH (ref 65–99)
Glucose-Capillary: 129 mg/dL — ABNORMAL HIGH (ref 65–99)
Glucose-Capillary: 102 mg/dL — ABNORMAL HIGH (ref 65–99)

## 2016-11-13 MED ORDER — CLONIDINE HCL 0.1 MG PO TABS
0.2000 mg | ORAL_TABLET | Freq: Two times a day (BID) | ORAL | Status: DC
  Administered 2016-11-13 – 2016-11-14 (×3): 0.2 mg via ORAL
  Filled 2016-11-13 (×3): qty 2

## 2016-11-13 MED ORDER — HYDRALAZINE HCL 25 MG PO TABS
25.0000 mg | ORAL_TABLET | Freq: Three times a day (TID) | ORAL | Status: DC
  Administered 2016-11-13 – 2016-11-21 (×19): 25 mg via ORAL
  Filled 2016-11-13 (×17): qty 1

## 2016-11-13 MED ORDER — METOPROLOL TARTRATE 50 MG PO TABS: 50.0000 mg | ORAL_TABLET | Freq: Two times a day (BID) | ORAL | Status: DC

## 2016-11-13 MED ORDER — HYDRALAZINE HCL 25 MG PO TABS
25.0000 mg | ORAL_TABLET | Freq: Once | ORAL | Status: AC
  Administered 2016-11-13: 25 mg via ORAL
  Filled 2016-11-13: qty 1

## 2016-11-13 MED ORDER — METOPROLOL TARTRATE 25 MG PO TABS
25.0000 mg | ORAL_TABLET | Freq: Two times a day (BID) | ORAL | Status: DC
  Administered 2016-11-13 – 2016-11-21 (×15): 25 mg via ORAL
  Filled 2016-11-13 (×15): qty 1

## 2016-11-13 MED ORDER — HYDRALAZINE HCL 50 MG PO TABS: 50.0000 mg | ORAL_TABLET | Freq: Three times a day (TID) | ORAL | Status: DC

## 2016-11-13 MED ORDER — METOPROLOL TARTRATE 25 MG PO TABS
25.0000 mg | ORAL_TABLET | Freq: Once | ORAL | Status: AC
  Administered 2016-11-13: 25 mg via ORAL
  Filled 2016-11-13: qty 1

## 2016-11-13 NOTE — Progress Notes (Signed)
[facility]  ROUNDING NOTE   Subjective:   Hemodialysis yesterday. Tolerated treatment well. UF of 1.5 liters. Three consecutive days of hemodialysis.   States her nausea has improved. Vomited after breakfast.   History taken with assistance of Spanish Interpreter.   Objective:  Vital signs in last 24 hours:  Temp:  [97.9 F (36.6 C)-98.5 F (36.9 C)] 98.3 F (36.8 C) (09/08 0805) Pulse Rate:  [93-107] 104 (09/08 0805) Resp:  [17-30] 24 (09/08 0805) BP: (177-193)/(67-76) 181/70 (09/08 0805) SpO2:  [90 %-100 %] 96 % (09/08 0805)  Weight change:  Filed Weights   11/10/16 0957 11/10/16 1258 11/11/16 1712  Weight: 50.9 kg (112 lb 3.4 oz) 50.9 kg (112 lb 3.4 oz) 50.9 kg (112 lb 3.4 oz)    Intake/Output: I/O last 3 completed shifts: In: 10 [I.V.:10] Out: 3950 [Emesis/NG output:450; Other:3500]   Intake/Output this shift:  Total I/O In: 600 [P.O.:600] Out: -   Physical Exam: General: ill appearing   Head: Normocephalic, atraumatic. Moist oral mucosal membranes  Eyes: Anicteric  Neck: Supple, trachea midline  Lungs:  clear  Heart: S1S2 no rubs  Abdomen:  Soft, nontender, bowel sounds present  Extremities: No peripheral edema.  Neurologic: Lethargic, alert and oriented x 3   Skin: No lesions  Access: LUE AVF    Basic Metabolic Panel:  Recent Labs Lab 11/15/2016 0157 11/13/2016 0423 11/12/2016 1202 11/09/16 0335 11/10/16 1000 11/12/16 0429  NA 134* 133*  --  140 136 138  K 3.7 3.7  --  3.0* 3.5 3.6  CL 92* 92*  --  99* 101 95*  CO2 24 26  --  _0 GLUCOSE 234* 268*  --  126* 141* 124*  BUN 52* 54*  --  21* 43* 24*  CREATININE 6.46* 6.46*  --  3.49* 5.00* 2.90*  CALCIUM 11.9* 11.9*  --  10.1 10.6* 11.1*  MG  --  2.4  --   --   --   --   PHOS  --  5.8* 2.2*  --  5.7*  --     Liver Function Tests:  Recent Labs Lab 11/09/2016 0157 11/10/16 1000  AST 27  --   ALT 9*  --   ALKPHOS 143*  --   BILITOT 1.2  --   PROT 6.7  --   ALBUMIN 3.1*  2.4*   No results for input(s): LIPASE, AMYLASE in the last 168 hours.  Recent Labs Lab 11/06/2016 0223  AMMONIA 34    CBC:  Recent Labs Lab 11/27/2016 0157 11/19/2016 0423 11/09/16 0335 11/10/16 1000 11/12/16 0429  WBC 6.6 6.1 6.1 5.4 5.3  NEUTROABS 4.1  --  5.0  --   --   HGB 10.2* 9.5* 8.9* 8.2* 8.6*  HCT 29.2* 27.3* 25.7* 24.3* 25.1*  MCV 87.7 87.4 88.5 88.8 89.3  PLT 112* 106* 103* 93* 80*    Cardiac Enzymes:  Recent Labs Lab 11/16/2016 0157  TROPONINI 0.04*    BNP: Invalid input(s): POCBNP  CBG:  Recent Labs Lab 11/11/16 2119 11/12/16 0730 11/12/16 1101 11/12/16 2131 11/13/16 0756  GLUCAP 111* 132* 161* 126* 87*    Microbiology: Results for orders placed or performed during the hospital encounter of 11/14/2016  MRSA PCR Screening     Status: None   Collection Time: 11/10/2016  3:41 AM  Result Value Ref Range Status   MRSA by PCR NEGATIVE NEGATIVE Final    Comment:        The GeneXpert  MRSA Assay (FDA approved for NASAL specimens only), is one component of a comprehensive MRSA colonization surveillance program. It is not intended to diagnose MRSA infection nor to guide or monitor treatment for MRSA infections.   Urine Culture     Status: Abnormal   Collection Time: 11/10/16  2:19 AM  Result Value Ref Range Status   Specimen Description URINE, RANDOM  Final   Special Requests NONE  Final   Culture MULTIPLE SPECIES PRESENT, SUGGEST RECOLLECTION (A)  Final   Report Status 11/11/2016 FINAL  Final    Coagulation Studies: No results for input(s): LABPROT, INR in the last 72 hours.  Urinalysis: No results for input(s): COLORURINE, LABSPEC, PHURINE, GLUCOSEU, HGBUR, BILIRUBINUR, KETONESUR, PROTEINUR, UROBILINOGEN, NITRITE, LEUKOCYTESUR in the last 72 hours.  Invalid input(s): APPERANCEUR    Imaging: Dg Chest Port 1 View  Result Date: 11/11/2016 CLINICAL DATA:  Shortness of breath. EXAM: PORTABLE CHEST 1 VIEW COMPARISON:  November 08, 2016  FINDINGS: The distal tip of the right Port-A-Cath terminates near the caval atrial junction. Increasing bilateral pulmonary opacities and probable effusions, left greater than right are identified. No pneumothorax. No other changes. IMPRESSION: Suspected developing effusions and edema. An infectious etiology is considered less likely. Electronically Signed   By: Dorise Bullion III M.D   On: 11/11/2016 16:18     Medications:    . aspirin EC  81 mg Oral Daily  . cinacalcet  30 mg Oral Q supper  . fentaNYL  25 mcg Transdermal Q72H  . heparin subcutaneous  5,000 Units Subcutaneous Q12H  . hydrALAZINE  25 mg Oral Q8H  . insulin aspart  0-15 Units Subcutaneous TID WC & HS  . irbesartan  300 mg Oral Daily  . ixazomib citrate  3 mg Oral Weekly  . levETIRAcetam  500 mg Oral Daily   And  . levETIRAcetam  250 mg Oral Once per day on Mon Wed Fri  . mouth rinse  15 mL Mouth Rinse BID  . metoprolol tartrate  25 mg Oral BID  . ondansetron  4 mg Oral Once  . senna  1 tablet Oral Daily  . sodium chloride flush  10-40 mL Intracatheter Q12H  . verapamil  120 mg Oral Daily   acetaminophen **OR** acetaminophen, albuterol, labetalol, LORazepam, ondansetron **OR** ondansetron (ZOFRAN) IV, oxyCODONE, prochlorperazine, sodium chloride flush  Assessment/ Plan:  51 y.o.Hispanic Spanish Speaking only female with diabetes mellitus type II, hypertension, multiple myeloma, ESRD on hemodialysis.   [facility] MWF [address] AVF  1. End Stage Renal Disease: ESRD secondary to multiple myeloma Hemodialysis treatment last 3 days. Total UF of 3.5 liters.  - Continue MWF schedule.    2. Anemia of chronic kidney disease with multiple myeloma:   - EPO with HD treatment  3. Hypertension with posterior reversible encephalopathy syndrome (PRES) on MRI Nicardipine gtt on admission.  - Home regimen of verapamil.  - Started irbesartan on this admission.   4. Secondary Hyperparathyroidism:  Calcium elevated at  11.9 on admission. Calcium elevated 11.1 Secondary to her underlying multiple myeloma. Phosphorus at goal.  - restarted low dose cinacalcet 53m daily on this admission.    LOS: 5[place]9/8/201811:21 AM

## 2016-11-13 NOTE — Progress Notes (Signed)
[place] [facility] [place] NAME: Jamie Keller    MR#:  [id number]  DATE OF BIRTH:  Jan 29, 1966    CHIEF COMbloINT:   Chief Complaint  Patient presents with  . Seizures  Patient denies any headache or blurry vision. Feeling weak and tired. Shortness of breath is improving . On hemodialysis. REVIEW OF SYSTEMS:  Review of Systems  Constitutional: Negative for chills and fever.  HENT: Negative for hearing loss.   Eyes: Negative for blurred vision, double vision and photophobia.  Respiratory: Negative for cough, hemoptysis and shortness of breath.   Cardiovascular: Negative for palpitations, orthopnea and leg swelling.  Gastrointestinal: Negative for abdominal pain, diarrhea and vomiting.  Genitourinary: Negative for dysuria and urgency.  Musculoskeletal: Negative for myalgias and neck pain.  Skin: Negative for rash.  Neurological: Negative for dizziness, focal weakness, seizures, weakness and headaches.  Psychiatric/Behavioral: Negative for memory loss. The patient does not have insomnia.     DRUG ALLERGIES:  No Known Allergies VITALS:  Blood pressure (!) 180/71, pulse (!) 104, temperature (!) 97.5 F (36.4 C), temperature source Oral, resp. rate 18, height 5' (1.524 m), weight 50.9 kg (112 lb 3.4 oz), last menstrual period 02/25/1995, SpO2 98 %. PHYSICAL EXAMINATION:  Physical Exam  Constitutional:  Alert, awake, oriented.  HENT:  Head: Normocephalic.  Mouth/Throat: Oropharynx is clear and moist.  Eyes: No scleral icterus.  Neck: Neck supple. No JVD present. No tracheal deviation present.  Cardiovascular: Normal rate, regular rhythm and normal heart sounds.  Exam reveals no gallop.   No murmur heard. Pulmonary/Chest: Effort normal and breath sounds normal. No respiratory distress. She has no wheezes. She has no rales.  Abdominal: Soft. Bowel sounds are normal. She exhibits no distension. There is no tenderness. There is no  rebound.  Musculoskeletal: She exhibits no edema or tenderness.  Neurological: No cranial nerve deficit.  .  Skin: No rash noted. No erythema.   LABORATORY PANEL:  Female CBC  Recent Labs Lab 11/12/16 0429  WBC 5.3  HGB 8.6*  HCT 25.1*  PLT 80*   ------------------------------------------------------------------------------------------------------------------ Chemistries   Recent Labs Lab 11/09/2016 0157 11/16/2016 0423  11/12/16 0429  NA 134* 133*  < > 138  K 3.7 3.7  < > 3.6  CL 92* 92*  < > 95*  CO2 24 26  < > 28  GLUCOSE 234* 268*  < > 124*  BUN 52* 54*  < > 24*  CREATININE 6.46* 6.46*  < > 2.90*  CALCIUM 11.9* 11.9*  < > 11.1*  MG  --  2.4  --   --   AST 27  --   --   --   ALT 9*  --   --   --   ALKPHOS 143*  --   --   --   BILITOT 1.2  --   --   --   < > = values in this interval not displayed. RADIOLOGY:  No results found. ASSESSMENT AND PLAN:   IMPRESSION AND PLAN: 51 year old female patient with history of multiple myeloma, end-stage renal disease on dialysis, hypertension presented to the emergency room with seizure and elevated blood pressure.   #. Hypertensive urgency Continue metoprolol and hydralazine. Clonidine is added to the regimen for better control of the blood pressure, titrate as needed. Goal systolic blood pressure less than 160  #. Posterior reversible encephalopathy syndrome;MRI of the brain did not show any acute stroke. Showed only PRES>patient is off  nicardipine drip. Transferred out of ICU. Patient had respiratory distress and shortness of breath and received emergency hemodialysis yesterday and she will get another hemodialysis today. Blood pressure is still high so increased the dose of metoprolol to 25 mg twice a day, added hydralazine 25 mg 3 times a day.  #.New onset seizure; changed it to by mouth Keppra.  Renally dose the Keppra. Spoke to pharmacy.  # End-stage renal disease on dialysis,nephro is following Patient's shortness of  breath improved after 3 consecutive days of hemodialysis  #History of multiple myeloma;follows up from oncology with Dr. Grayland Ormond. Patient is on fentanyl patch for the pain control.on  ixazonib .  #Generalized weakness PT consult for deconditioning   Prognosis really poor and high risk for cardiac arrest.. . All the records are reviewed and case discussed with Care Management/Social Worker. Management plans discussed with the patient, family and they are in agreement.  CODE STATUS: Full Code  TOTAL TIME TAKING CARE OF THIS PATIENT: 37 minutes.   More than 50% of the time was spent in counseling/coordination of care: YES  POSSIBLE D/C IN 3 DAYS, DEPENDING ON CLINICAL CONDITION.   [name] M.D on 11/13/2016 at 4:19 PM  Between 7am to 6pm - Pager - [phone]  After 6pm go to [email or web address] - [facility]

## 2016-11-14 ENCOUNTER — Inpatient Hospital Stay: Payer: Medicaid Other

## 2016-11-14 LAB — BASIC METABOLIC PANEL
Anion gap: 13 (ref 5–15)
BUN: 40 mg/dL — AB (ref 6–20)
CO2: 28 mmol/L (ref 22–32)
Calcium: 11.4 mg/dL — ABNORMAL HIGH (ref 8.9–10.3)
Chloride: 93 mmol/L — ABNORMAL LOW (ref 101–111)
Creatinine, Ser: 4.35 mg/dL — ABNORMAL HIGH (ref 0.44–1.00)
GFR calc Af Amer: 12 mL/min — ABNORMAL LOW (ref >60)
GFR, EST NON AFRICAN AMERICAN: 11 mL/min — AB (ref >60)
GLUCOSE: 99 mg/dL (ref 65–99)
POTASSIUM: 4.5 mmol/L (ref 3.5–5.1)
Sodium: 134 mmol/L — ABNORMAL LOW (ref 135–145)

## 2016-11-14 LAB — GLUCOSE, CAPILLARY
GLUCOSE-CAPILLARY: 112 mg/dL — AB (ref 65–99)
GLUCOSE-CAPILLARY: 147 mg/dL — AB (ref 65–99)
GLUCOSE-CAPILLARY: 85 mg/dL (ref 65–99)
GLUCOSE-CAPILLARY: 100 mg/dL — AB (ref 65–99)

## 2016-11-14 MED ORDER — PIPERACILLIN-TAZOBACTAM 3.375 G IVPB
3.3750 g | Freq: Two times a day (BID) | INTRAVENOUS | Status: DC
  Administered 2016-11-14 – 2016-11-21 (×13): 3.375 g via INTRAVENOUS
  Filled 2016-11-14 (×13): qty 50

## 2016-11-14 MED ORDER — CINACALCET HCL 30 MG PO TABS
60.0000 mg | ORAL_TABLET | Freq: Every day | ORAL | Status: DC
  Administered 2016-11-14 – 2016-11-20 (×5): 60 mg via ORAL
  Filled 2016-11-14 (×8): qty 2

## 2016-11-14 MED ORDER — IBUPROFEN 600 MG PO TABS
600.0000 mg | ORAL_TABLET | Freq: Once | ORAL | Status: AC
  Administered 2016-11-14: 600 mg via ORAL
  Filled 2016-11-14: qty 1

## 2016-11-14 MED ORDER — VANCOMYCIN HCL IN DEXTROSE 1-5 GM/200ML-% IV SOLN
1000.0000 mg | Freq: Once | INTRAVENOUS | Status: AC
  Administered 2016-11-14: 1000 mg via INTRAVENOUS
  Filled 2016-11-14: qty 200

## 2016-11-14 MED ORDER — VANCOMYCIN HCL 500 MG IV SOLR
500.0000 mg | INTRAVENOUS | Status: DC
  Administered 2016-11-15: 500 mg via INTRAVENOUS
  Filled 2016-11-14 (×4): qty 500

## 2016-11-14 MED ORDER — ALBUMIN HUMAN 25 % IV SOLN
12.5000 g | Freq: Once | INTRAVENOUS | Status: DC
  Filled 2016-11-14 (×2): qty 50

## 2016-11-14 MED ORDER — CLONIDINE HCL 0.1 MG PO TABS
0.1000 mg | ORAL_TABLET | Freq: Two times a day (BID) | ORAL | Status: DC
  Administered 2016-11-14 – 2016-11-21 (×14): 0.1 mg via ORAL
  Filled 2016-11-14 (×14): qty 1

## 2016-11-14 NOTE — Progress Notes (Signed)
Pharmacy Antibiotic Note  Jamie Keller is a 51 y.o. female admitted on 11/30/2016 with sepsis.  Pharmacy has been consulted for zosyn and vancomycin dosing.  Plan: Zosyn 3.375g IV q12h (4 hour infusion). Vancomycin 1gm iv x 1 followed by vancomycin 500mg  iv every HD session (MWF). Trough will need to be drawn prior to HD session this week, goal 15-20.   Height: 5' (152.4 cm) Weight: 112 lb 3.4 oz (50.9 kg) IBW/kg (Calculated) : 45.5  Temp (24hrs), Avg:98.8 F (37.1 C), Min:97.5 F (36.4 C), Max:101.3 F (38.5 C)   Recent Labs Lab 11/16/2016 0157 12/04/2016 0423 11/09/16 0335 11/10/16 1000 11/12/16 0429 11/14/16 0538  WBC 6.6 6.1 6.1 5.4 5.3  --   CREATININE 6.46* 6.46* 3.49* 5.00* 2.90* 4.35*    Estimated Creatinine Clearance: 10.9 mL/min (A) (by C-G formula based on SCr of 4.35 mg/dL (H)).    No Known Allergies  Antimicrobials this admission: 9/9 vancomycin >>  9/9 zosyn >>    Microbiology results  Recent Results (from the past 240 hour(s))  MRSA PCR Screening     Status: None   Collection Time: 11/24/2016  3:41 AM  Result Value Ref Range Status   MRSA by PCR NEGATIVE NEGATIVE Final    Comment:        The GeneXpert MRSA Assay (FDA approved for NASAL specimens only), is one component of a comprehensive MRSA colonization surveillance program. It is not intended to diagnose MRSA infection nor to guide or monitor treatment for MRSA infections.   Urine Culture     Status: Abnormal   Collection Time: 11/10/16  2:19 AM  Result Value Ref Range Status   Specimen Description URINE, RANDOM  Final   Special Requests NONE  Final   Culture MULTIPLE SPECIES PRESENT, SUGGEST RECOLLECTION (A)  Final   Report Status 11/11/2016 FINAL  Final     Thank you for allowing pharmacy to be a part of this patient's care.  Donna Christen Kellis Topete 11/14/2016 10:39 AM

## 2016-11-14 NOTE — Progress Notes (Signed)
  End of hd 

## 2016-11-14 NOTE — Progress Notes (Signed)
Jamie Keller NAME: Jamie Keller    MR#:  [id number]  DATE OF BIRTH:  [birth year]    CHIEF COMbloINT:   Chief Complaint  Patient presents with  . Seizures  Patient He is very weak and lethargic. Febrile today. X-ray with pulmonary edema, for hemodialysis today REVIEW OF SYSTEMS:  Review of Systems  Constitutional: Positive for fever. Negative for chills.  HENT: Negative for hearing loss.   Eyes: Negative for blurred vision, double vision and photophobia.  Respiratory: Negative for cough, hemoptysis and shortness of breath.   Cardiovascular: Negative for palpitations, orthopnea and leg swelling.  Gastrointestinal: Negative for abdominal pain, diarrhea and vomiting.  Genitourinary: Negative for dysuria and urgency.  Musculoskeletal: Negative for myalgias and neck pain.  Skin: Negative for rash.  Neurological: Negative for dizziness, focal weakness, seizures, weakness and headaches.  Psychiatric/Behavioral: Negative for memory loss. The patient does not have insomnia.     DRUG ALLERGIES:  No Known Allergies VITALS:  Blood pressure (!) 102/44, pulse (!) 59, temperature 98.4 F (36.9 C), temperature source Oral, resp. rate 16, height 5' (1.524 m), weight 50.9 kg (112 lb 3.4 oz), last menstrual period 02/25/1995, SpO2 99 %. PHYSICAL EXAMINATION:  Physical Exam  Constitutional:  Alert, awake, oriented.  HENT:  Head: Normocephalic.  Mouth/Throat: Oropharynx is clear and moist.  Eyes: No scleral icterus.  Neck: Neck supple. No JVD present. No tracheal deviation present.  Cardiovascular: Normal rate, regular rhythm and normal heart sounds.  Exam reveals no gallop.   No murmur heard. Pulmonary/Chest: Effort normal and breath sounds normal. No respiratory distress. She has no wheezes. She has no rales.  Abdominal: Soft. Bowel sounds are normal. She exhibits no distension. There is no tenderness. There is no rebound.    Musculoskeletal: She exhibits no edema or tenderness.  Neurological: No cranial nerve deficit.  .  Skin: No rash noted. No erythema.   LABORATORY PANEL:  Female CBC  Recent Labs Lab 11/12/16 0429  WBC 5.3  HGB 8.6*  HCT 25.1*  PLT 80*   ------------------------------------------------------------------------------------------------------------------ Chemistries   Recent Labs Lab 11/28/2016 0157 11/25/2016 0423  11/14/16 0538  NA 134* 133*  < > 134*  K 3.7 3.7  < > 4.5  CL 92* 92*  < > 93*  CO2 24 26  < > 28  GLUCOSE 234* 268*  < > 99  BUN 52* 54*  < > 40*  CREATININE 6.46* 6.46*  < > 4.35*  CALCIUM 11.9* 11.9*  < > 11.4*  MG  --  2.4  --   --   AST 27  --   --   --   ALT 9*  --   --   --   ALKPHOS 143*  --   --   --   BILITOT 1.2  --   --   --   < > = values in this interval not displayed. RADIOLOGY:  Dg Chest 2 View  Result Date: 11/14/2016 CLINICAL DATA:  Patient with shortness of breath. History of multiple myeloma. End-stage renal disease. EXAM: CHEST  2 VIEW COMPARISON:  Chest radiograph 11/11/2016 FINDINGS: Right anterior chest wall Port-A-Cath is present with tip projecting over the superior vena cava. Monitoring leads overlie the patient. Stable cardiomegaly. Right-greater-than-left diffuse interstitial pulmonary opacities. Moderate right and small left layering pleural effusions. No pneumothorax. IMPRESSION: Cardiomegaly with pulmonary edema and layering bilateral pleural effusions. Electronically Signed   By: Polly Cobia.D.  On: 11/14/2016 10:49   ASSESSMENT AND PLAN:   IMPRESSION AND PLAN: 51 year old female patient with history of multiple myeloma, end-stage renal disease on dialysis, hypertension presented to the emergency room with seizure and elevated blood pressure.   #fever-etiology unclear Will check lactic acid Pancultures ordered Broad-spectrum IV antibiotics IV Zosyn and vancomycin as started Chest x-ray with pulmonary edema Infectious  disease consult is placed   #. Hypertensive urgency Clinically improved Continue metoprolol 25 mg and hydralazine 25 mg every 8 hours. Clonidine is added to the regimen for better control of the blood pressure, titrate as needed. Goal systolic blood pressure less than 160  #. Posterior reversible encephalopathy syndrome;MRI of the brain did not show any acute stroke. Showed only PRES>patient is off nicardipine drip. Transferred out of ICU. Patient had respiratory distress and shortness of breath and received emergency hemodialysis yesterday and she will get another hemodialysis today. Blood pressure is still high so increased the dose of metoprolol to 25 mg twice a day, added hydralazine 25 mg 3 times a day.clonidine is added to the regimen  #.New onset seizure; by mouth Keppra.  Renally dose the Keppra.  No other episodes of seizures noticed  # End-stage renal disease on dialysis,nephro is following Patient's shortness of breath improved after 3 consecutive days of hemodialysis Chest x-ray with pulmonary edema today, for dialysis today Calcium level is high at 11.4,Sensipar dose increased to 60 mg  #History of multiple myeloma;follows up from oncology with Dr. Grayland Ormond. Patient is on fentanyl patch for the pain control.on  ixazonib .  #Generalized weakness PT consult for deconditioning   Prognosis really poor and high risk for cardiac arrest.. . All the records are reviewed and case discussed with Care Management/Social Worker. Management plans discussed with the patient, [place] and they are in agreement.  CODE STATUS: Full Code  TOTAL TIME TAKING CARE OF THIS PATIENT: 37 minutes.   More than 50% of the time was spent in counseling/coordination of care: YES  POSSIBLE D/C IN 3 DAYS, DEPENDING ON CLINICAL CONDITION.   [name] M.D on 11/14/2016 at 1:28 PM  Between 7am to 6pm - Pager - [phone]  After 6pm go to [email or web address] - Research scientist (physical sciences) Hospitalists

## 2016-11-14 NOTE — Progress Notes (Signed)
Pre hd info 

## 2016-11-14 NOTE — Progress Notes (Signed)
[facility]  ROUNDING NOTE   Subjective:   Patient states she feels better.   Tmax 101.3  Calcium 11.4  History taken with assistance of Spanish Interpreter.   Objective:  Vital signs in last 24 hours:  Temp:  [97.8 F (36.6 C)-101.3 F (38.5 C)] 98.4 F (36.9 C) (09/09 1148) Pulse Rate:  [59-104] 59 (09/09 1148) Resp:  [16-19] 16 (09/09 1148) BP: (102-180)/(44-71) 102/44 (09/09 1148) SpO2:  [92 %-100 %] 99 % (09/09 1148)  Weight change:  Filed Weights   11/10/16 0957 11/10/16 1258 11/11/16 1712  Weight: 50.9 kg (112 lb 3.4 oz) 50.9 kg (112 lb 3.4 oz) 50.9 kg (112 lb 3.4 oz)    Intake/Output: I/O last 3 completed shifts: In: 600 [P.O.:600] Out: 100 [Emesis/NG output:100]   Intake/Output this shift:  No intake/output data recorded.  Physical Exam: General: ill appearing   Head: Normocephalic, atraumatic. Moist oral mucosal membranes  Eyes: Anicteric  Neck: Supple, trachea midline  Lungs:  Bilateral crackles  Heart: S1S2 no rubs  Abdomen:  Soft, nontender, bowel sounds present  Extremities: No peripheral edema.  Neurologic: Lethargic, alert and oriented x 3   Skin: No lesions  Access: LUE AVF    Basic Metabolic Panel:  Recent Labs Lab 12/01/2016 0423 11/10/2016 1202 11/09/16 0335 11/10/16 1000 11/12/16 0429 11/14/16 0538  NA 133*  --  140 136 138 134*  K 3.7  --  3.0* 3.5 3.6 4.5  CL 92*  --  99* 101 95* 93*  CO2 26  --  26 23 28 28   GLUCOSE 268*  --  126* 141* 124* 99  BUN 54*  --  21* 43* 24* 40*  CREATININE 6.46*  --  3.49* 5.00* 2.90* 4.35*  CALCIUM 11.9*  --  10.1 10.6* 11.1* 11.4*  MG 2.4  --   --   --   --   --   PHOS 5.8* 2.2*  --  5.7*  --   --     Liver Function Tests:  Recent Labs Lab 11/27/2016 0157 11/10/16 1000  AST 27  --   ALT 9*  --   ALKPHOS 143*  --   BILITOT 1.2  --   PROT 6.7  --   ALBUMIN 3.1* 2.4*   No results for input(s): LIPASE, AMYLASE in the last 168 hours.  Recent Labs Lab 11/07/2016 0223   AMMONIA 34    CBC:  Recent Labs Lab 11/09/2016 0157 12/01/2016 0423 11/09/16 0335 11/10/16 1000 11/12/16 0429  WBC 6.6 6.1 6.1 5.4 5.3  NEUTROABS 4.1  --  5.0  --   --   HGB 10.2* 9.5* 8.9* 8.2* 8.6*  HCT 29.2* 27.3* 25.7* 24.3* 25.1*  MCV 87.7 87.4 88.5 88.8 89.3  PLT 112* 106* 103* 93* 80*    Cardiac Enzymes:  Recent Labs Lab 11/28/2016 0157  TROPONINI 0.04*    BNP: Invalid input(s): POCBNP  CBG:  Recent Labs Lab 11/13/16 1142 11/13/16 1644 11/13/16 2112 11/14/16 0731 11/14/16 1140  GLUCAP 148* 129* 102* 112* 147*    Microbiology: Results for orders placed or performed during the hospital encounter of 11/29/2016  MRSA PCR Screening     Status: None   Collection Time: 11/06/2016  3:41 AM  Result Value Ref Range Status   MRSA by PCR NEGATIVE NEGATIVE Final    Comment:        The GeneXpert MRSA Assay (FDA approved for NASAL specimens only), is one component of a comprehensive MRSA colonization surveillance  program. It is not intended to diagnose MRSA infection nor to guide or monitor treatment for MRSA infections.   Urine Culture     Status: Abnormal   Collection Time: 11/10/16  2:19 AM  Result Value Ref Range Status   Specimen Description URINE, RANDOM  Final   Special Requests NONE  Final   Culture MULTIPLE SPECIES PRESENT, SUGGEST RECOLLECTION (A)  Final   Report Status 11/11/2016 FINAL  Final    Coagulation Studies: No results for input(s): LABPROT, INR in the last 72 hours.  Urinalysis: No results for input(s): COLORURINE, LABSPEC, PHURINE, GLUCOSEU, HGBUR, BILIRUBINUR, KETONESUR, PROTEINUR, UROBILINOGEN, NITRITE, LEUKOCYTESUR in the last 72 hours.  Invalid input(s): APPERANCEUR    Imaging: Dg Chest 2 View  Result Date: 11/14/2016 CLINICAL DATA:  Patient with shortness of breath. History of multiple myeloma. End-stage renal disease. EXAM: CHEST  2 VIEW COMPARISON:  Chest radiograph 11/11/2016 FINDINGS: Right anterior chest wall Port-A-Cath  is present with tip projecting over the superior vena cava. Monitoring leads overlie the patient. Stable cardiomegaly. Right-greater-than-left diffuse interstitial pulmonary opacities. Moderate right and small left layering pleural effusions. No pneumothorax. IMPRESSION: Cardiomegaly with pulmonary edema and layering bilateral pleural effusions. Electronically Signed   By: Lovey Newcomer M.D.   On: 11/14/2016 10:49     Medications:   . piperacillin-tazobactam (ZOSYN)  IV 3.375 g (11/14/16 1109)  . [START ON 11/15/2016] vancomycin     . aspirin EC  81 mg Oral Daily  . cinacalcet  60 mg Oral Q supper  . cloNIDine  0.2 mg Oral BID  . fentaNYL  25 mcg Transdermal Q72H  . heparin subcutaneous  5,000 Units Subcutaneous Q12H  . hydrALAZINE  25 mg Oral Q8H  . insulin aspart  0-15 Units Subcutaneous TID WC & HS  . irbesartan  300 mg Oral Daily  . ixazomib citrate  3 mg Oral Weekly  . levETIRAcetam  500 mg Oral Daily   And  . levETIRAcetam  250 mg Oral Once per day on Mon Wed Fri  . mouth rinse  15 mL Mouth Rinse BID  . metoprolol tartrate  25 mg Oral BID  . ondansetron  4 mg Oral Once  . senna  1 tablet Oral Daily  . sodium chloride flush  10-40 mL Intracatheter Q12H  . verapamil  120 mg Oral Daily   acetaminophen **OR** acetaminophen, albuterol, labetalol, LORazepam, ondansetron **OR** ondansetron (ZOFRAN) IV, oxyCODONE, prochlorperazine, sodium chloride flush  Assessment/ Plan:  51 y.o.Hispanic Spanish Speaking only female with diabetes mellitus type II, hypertension, multiple myeloma, ESRD on hemodialysis.   [facility] MWF [address] AVF  1. End Stage Renal Disease: ESRD secondary to multiple myeloma.  CXR with more pulmonary edema. Patient now requiring oxygen.  Hemodialysis treatment 9/5, 9/6 and 9/7. Total UF of 3.5 liters.  - Hemodialysis for today. Orders prepared.  - Resume MWF schedule  2. Anemia of chronic kidney disease with multiple myeloma:  Hemoglobin 8.6 - EPO with  HD treatment MWF  3. Hypertension with posterior reversible encephalopathy syndrome (PRES) on MRI Nicardipine gtt on admission.  - Home regimen of verapamil.  - Started irbesartan on this admission.   4. Secondary Hyperparathyroidism:  Calcium elevated at 11.9 on admission. Calcium elevated 11.4 Secondary to her underlying multiple myeloma. Phosphorus at goal.  - restarted low dose cinacalcet on this admission, increase to 46m daily today   LOS: [place]9/9/201812:43 PM

## 2016-11-14 NOTE — Progress Notes (Signed)
Post hd assessment 

## 2016-11-14 NOTE — Progress Notes (Signed)
PT Hold Note  Patient Details Name: Jamie Keller MRN: [id number] DOB: 1965/06/14   Evaluation Hold:    Reason Eval/Treat Not Completed: Medical issues which prohibited therapy. Order received and chart reviewed. Pt currently febrile. Spoke with RN who reports that pt is very weak today and currently on cooling blanket. Not appropriate for PT evaluation. Will hold evaluation until later date/time when pt is more stable.   Lyndel Safe Huprich PT, DPT   Huprich,Jason 11/14/2016, 11:50 AM

## 2016-11-14 NOTE — Progress Notes (Signed)
Hd start, responds to voice, c/o of weakness and fatigue, vss

## 2016-11-14 NOTE — Progress Notes (Signed)
Pre hd assessment  

## 2016-11-14 NOTE — Progress Notes (Signed)
Post hd vitals 

## 2016-11-15 ENCOUNTER — Inpatient Hospital Stay: Payer: Medicaid Other

## 2016-11-15 LAB — CBC WITH DIFFERENTIAL/PLATELET
BASOS PCT: 0 %
Basophils Absolute: 0 10*3/uL (ref 0–0.1)
EOS PCT: 1 %
Eosinophils Absolute: 0 10*3/uL (ref 0–0.7)
HEMATOCRIT: 21.1 % — AB (ref 35.0–47.0)
HEMOGLOBIN: 7.1 g/dL — AB (ref 12.0–16.0)
Lymphocytes Relative: 7 %
Lymphs Abs: 0.2 10*3/uL — ABNORMAL LOW (ref 1.0–3.6)
MCH: 30.2 pg (ref 26.0–34.0)
MCHC: 33.9 g/dL (ref 32.0–36.0)
MCV: 88.9 fL (ref 80.0–100.0)
MONOS PCT: 9 %
Monocytes Absolute: 0.3 10*3/uL (ref 0.2–0.9)
NEUTROS PCT: 83 %
Neutro Abs: 2.4 10*3/uL (ref 1.4–6.5)
Platelets: 33 10*3/uL — ABNORMAL LOW (ref 150–440)
RBC: 2.37 MIL/uL — AB (ref 3.80–5.20)
RDW: 15.2 % — ABNORMAL HIGH (ref 11.5–14.5)
WBC: 2.9 10*3/uL — AB (ref 3.6–11.0)

## 2016-11-15 LAB — RENAL FUNCTION PANEL
Albumin: 2.6 g/dL — ABNORMAL LOW (ref 3.5–5.0)
Anion gap: 12 (ref 5–15)
BUN: 28 mg/dL — AB (ref 6–20)
CALCIUM: 11.3 mg/dL — AB (ref 8.9–10.3)
CHLORIDE: 93 mmol/L — AB (ref 101–111)
CO2: 28 mmol/L (ref 22–32)
CREATININE: 3.93 mg/dL — AB (ref 0.44–1.00)
GFR calc non Af Amer: 12 mL/min — ABNORMAL LOW (ref >60)
GFR, EST AFRICAN AMERICAN: 14 mL/min — AB (ref >60)
Glucose, Bld: 202 mg/dL — ABNORMAL HIGH (ref 65–99)
Phosphorus: 5.8 mg/dL — ABNORMAL HIGH (ref 2.5–4.6)
Potassium: 4.5 mmol/L (ref 3.5–5.1)
SODIUM: 133 mmol/L — AB (ref 135–145)

## 2016-11-15 LAB — GLUCOSE, CAPILLARY
GLUCOSE-CAPILLARY: 99 mg/dL (ref 65–99)
Glucose-Capillary: 211 mg/dL — ABNORMAL HIGH (ref 65–99)
Glucose-Capillary: 110 mg/dL — ABNORMAL HIGH (ref 65–99)

## 2016-11-15 LAB — CBC
HCT: 17.9 % — ABNORMAL LOW (ref 35.0–47.0)
Hemoglobin: 6.2 g/dL — ABNORMAL LOW (ref 12.0–16.0)
MCH: 31.2 pg (ref 26.0–34.0)
MCHC: 34.8 g/dL (ref 32.0–36.0)
MCV: 89.7 fL (ref 80.0–100.0)
PLATELETS: 29 10*3/uL — AB (ref 150–440)
RBC: 2 MIL/uL — ABNORMAL LOW (ref 3.80–5.20)
RDW: 15.2 % — AB (ref 11.5–14.5)
WBC: 2.2 10*3/uL — ABNORMAL LOW (ref 3.6–11.0)

## 2016-11-15 LAB — HEPATIC FUNCTION PANEL
ALK PHOS: 102 U/L (ref 38–126)
ALT: 11 U/L — AB (ref 14–54)
AST: 22 U/L (ref 15–41)
Albumin: 2.4 g/dL — ABNORMAL LOW (ref 3.5–5.0)
BILIRUBIN DIRECT: 0.2 mg/dL (ref 0.1–0.5)
BILIRUBIN TOTAL: 0.6 mg/dL (ref 0.3–1.2)
Indirect Bilirubin: 0.4 mg/dL (ref 0.3–0.9)
Total Protein: 5.2 g/dL — ABNORMAL LOW (ref 6.5–8.1)

## 2016-11-15 LAB — BASIC METABOLIC PANEL
ANION GAP: 13 (ref 5–15)
BUN: 23 mg/dL — ABNORMAL HIGH (ref 6–20)
CHLORIDE: 95 mmol/L — AB (ref 101–111)
CO2: 30 mmol/L (ref 22–32)
Calcium: 11.3 mg/dL — ABNORMAL HIGH (ref 8.9–10.3)
Creatinine, Ser: 3.15 mg/dL — ABNORMAL HIGH (ref 0.44–1.00)
GFR calc Af Amer: 18 mL/min — ABNORMAL LOW (ref >60)
GFR, EST NON AFRICAN AMERICAN: 16 mL/min — AB (ref >60)
GLUCOSE: 100 mg/dL — AB (ref 65–99)
POTASSIUM: 4.6 mmol/L (ref 3.5–5.1)
Sodium: 138 mmol/L (ref 135–145)

## 2016-11-15 MED ORDER — VERAPAMIL HCL ER 120 MG PO TBCR
120.0000 mg | EXTENDED_RELEASE_TABLET | Freq: Every day | ORAL | Status: DC
  Administered 2016-11-16 – 2016-11-21 (×5): 120 mg via ORAL
  Filled 2016-11-15 (×7): qty 1

## 2016-11-15 MED ORDER — EPOETIN ALFA 10000 UNIT/ML IJ SOLN
10000.0000 [IU] | INTRAMUSCULAR | Status: DC
  Administered 2016-11-15 – 2016-11-19 (×3): 10000 [IU] via INTRAVENOUS

## 2016-11-15 MED ORDER — IRBESARTAN 150 MG PO TABS
300.0000 mg | ORAL_TABLET | Freq: Every day | ORAL | Status: DC
  Administered 2016-11-16 – 2016-11-20 (×5): 300 mg via ORAL
  Filled 2016-11-15 (×5): qty 2

## 2016-11-15 MED ORDER — SODIUM CHLORIDE 0.9 % IV SOLN
Freq: Once | INTRAVENOUS | Status: AC
  Administered 2016-11-16: 12:00:00 via INTRAVENOUS

## 2016-11-15 NOTE — Progress Notes (Signed)
CRITICAL VALUE ALERT  Critical Value:  Platelet at 29  Date & Time Notied:  11/15/2016, 1305  Provider Notified: Dr. Margaretmary Eddy  Orders Received/Actions taken: awaiting for order if needed.

## 2016-11-15 NOTE — Progress Notes (Signed)
Hd start 

## 2016-11-15 NOTE — Progress Notes (Signed)
Dr. Berline Lopes made aware that pt has + antibody screen/ will be a delay when blood is ready/ will transfuse when blood ready

## 2016-11-15 NOTE — Progress Notes (Signed)
[name]: Jamie Keller    MR#:  [id number]  DATE OF BIRTH:  1966-01-05    CHIEF COMbloINT:   Chief Complaint  Patient presents with  . Seizures  Patient is very weak and reporting bilateral knee pain. Afebrile today. X-ray with pulmonary edema, for hemodialysis today,had hemodialysis yesterday REVIEW OF SYSTEMS:  Review of Systems  Constitutional: Positive for fever. Negative for chills.  HENT: Negative for hearing loss.   Eyes: Negative for blurred vision, double vision and photophobia.  Respiratory: Negative for cough, hemoptysis and shortness of breath.   Cardiovascular: Negative for palpitations, orthopnea and leg swelling.  Gastrointestinal: Negative for abdominal pain, diarrhea and vomiting.  Genitourinary: Negative for dysuria and urgency.  Musculoskeletal: Positive for joint pain. Negative for myalgias and neck pain.  Skin: Negative for rash.  Neurological: Negative for dizziness, focal weakness, seizures, weakness and headaches.  Psychiatric/Behavioral: Negative for memory loss. The patient does not have insomnia.     DRUG ALLERGIES:  No Known Allergies VITALS:  Blood pressure (!) 152/47, pulse 91, temperature 99.2 F (37.3 C), resp. rate 18, height 5' (1.524 m), weight 50.9 kg (112 lb 3.4 oz), last menstrual period 02/25/1995, SpO2 97 %. PHYSICAL EXAMINATION:  Physical Exam  Constitutional:  Alert, awake, oriented.  HENT:  Head: Normocephalic.  Mouth/Throat: Oropharynx is clear and moist.  Eyes: No scleral icterus.  Neck: Neck supple. No JVD present. No tracheal deviation present.  Cardiovascular: Normal rate, regular rhythm and normal heart sounds.  Exam reveals no gallop.   No murmur heard. Pulmonary/Chest: Effort normal and breath sounds normal. No respiratory distress. She has no wheezes. She has no rales.  Abdominal: Soft. Bowel sounds are normal. She exhibits no distension. There is no  tenderness. There is no rebound.  Musculoskeletal: She exhibits no edema or tenderness.  Bilateral knee tenderness is present  Neurological: No cranial nerve deficit.  .  Skin: No rash noted. No erythema.   LABORATORY PANEL:  Female CBC  Recent Labs Lab 11/15/16 0435  WBC 2.9*  HGB 7.1*  HCT 21.1*  PLT 33*   ------------------------------------------------------------------------------------------------------------------ Chemistries   Recent Labs Lab 11/15/16 0435  NA 138  K 4.6  CL 95*  CO2 30  GLUCOSE 100*  BUN 23*  CREATININE 3.15*  CALCIUM 11.3*   RADIOLOGY:  No results found. ASSESSMENT AND PLAN:   IMPRESSION AND PLAN: 51 year old female patient with history of multiple myeloma, end-stage renal disease on dialysis, hypertension presented to the emergency room with seizure and elevated blood pressure.   #fever-etiology unclear Will check lactic acid Pancultures ordered,blood cultures are negative so far, urine cultures are pending Broad-spectrum IV antibiotics IV Zosyn and vancomycin started 11/14/2016 Chest x-ray with pulmonary edema Infectious disease consult is pending  #History of multiple myeloma with lytic bony lesions Patient is complaining bilateral knee pain, could be from underlying multiple myeloma will get x-rays Consult oncology regarding prognosis; will consider palliative care consult if needed Patient is on fentanyl patch for the pain control.on  ixazonib .    #. Hypertensive urgency Clinically improving Continue metoprolol 25 mg , verapamil 120 mg and hydralazine 25 mg every 8 hours. Clonidine is added to the regimen for better control of the blood pressure, titrate as needed. Goal systolic blood pressure less than 160  #. Posterior reversible encephalopathy syndrome;MRI of the brain did not show any acute stroke. Showed only PRES>patient is off nicardipine drip. Transferred out of ICU. Patient  had respiratory distress and shortness of  breath and received emergency hemodialysis yesterday and she will get another hemodialysis today. Blood pressure is still high so increased the dose of metoprolol to 25 mg twice a day, added hydralazine 25 mg 3 times a day.clonidine is added to the regimen  #.New onset seizure; by mouth Keppra.  Renally dose the Keppra.  No other episodes of seizures noticed  # End-stage renal disease on dialysis,nephro is following Patient's shortness of breath improved after 3 consecutive days of hemodialysis Chest x-ray with pulmonary edema on 11/14/2016 had hemodialysis yesterday and scheduled for today Calcium level is high at 11.4,Sensipar dose increased to 60 mg,oncology consulted    Prognosis really poor and high risk for cardiac arrest.. . All the records are reviewed and case discussed with Care Management/Social Worker. Management plans discussed with the patient, [name] , and husband at bedside and they are in agreement.  CODE STATUS: Full Code  TOTAL TIME TAKING CARE OF THIS PATIENT: 37 minutes.   More than 50% of the time was spent in counseling/coordination of care: YES  POSSIBLE D/C IN 3 DAYS, DEPENDING ON CLINICAL CONDITION.   [name] M.D on 11/15/2016 at 11:45 AM  Between 7am to 6pm - Pager - [phone]  After 6pm go to [email or web address] - [employer] Hospitalists

## 2016-11-15 NOTE — Progress Notes (Signed)
Post hd vitals 

## 2016-11-15 NOTE — Consult Note (Signed)
[facility] Infectious Disease     Reason for Consult:sepsis   Referring Physician: Gouru, A Date of Admission:  11/23/2016   Active Problems:   Hypertensive urgency   Hypercalcemia   Status epilepticus ([place])   HPI: Jamie Keller is a 51 y.o. female admitted with seizure at home and vomiting. Found to have hypertensive crisis.  She has hx ESRD, MM, DM.  On admit 9/3 wbc 6 and fever to 100.8.  UA 6-30 wbc, UCX mixed. CXR effusions and CHF.  MRI showed PRES.  No abx given. Was afebrile until 9/8 when developed fever to 101.3 and wbc decreased t0 2.9.  Started vanco and zosyn. [place] neg. CXR again showed pulm edema. Had knee pain as well with xrays done.  She does have a AVF in LUE and a portacath in R chest wall but denies pain in the sites.  Seen today on HD.   Past Medical History:  Diagnosis Date  . Anemia   . Anxiety   . Chronic kidney disease    DIALYSIS  . Depression   . Diabetes mellitus without complication ([place])   . Dialysis patient ([place])    Mon, Wed, Fri  . History of chemotherapy    chemo on Tuesday and Wednesday  . Hypertension   . Multiple myeloma ([place])   . Neuropathy associated with cancer ([place])   . Osteoarthritis   . Pancreatitis   . Pneumonia    September 2017, [facility]  . Post-menopausal 2014  . Sepsis [facility])    Past Surgical History:  Procedure Laterality Date  . A/V SHUNT INTERVENTION Left 07/07/2016   Procedure: A/V Shunt Intervention;  Surgeon: Algernon Huxley, MD;  Location: [place] CV LAB;  Service: Cardiovascular;  Laterality: Left;  . AV FISTULA PLACEMENT Left 02/20/2016   Procedure: INSERTION OF ARTERIOVENOUS (AV) GORE-TEX GRAFT ARM;  Surgeon: Katha Cabal, MD;  Location: [facility] ORS;  Service: Vascular;  Laterality: Left;  . CESAREAN SECTION     x2  . dialysis catheter placement    . PERIPHERAL VASCULAR CATHETERIZATION N/A 11/19/2015   Procedure: Dialysis/Perma Catheter Insertion;  Surgeon: Algernon Huxley, MD;  Location: [place] CV LAB;   Service: Cardiovascular;  Laterality: N/A;  . PERIPHERAL VASCULAR CATHETERIZATION Left 03/10/2016   Procedure: Thrombectomy;  Surgeon: Katha Cabal, MD;  Location: [place] CV LAB;  Service: Cardiovascular;  Laterality: Left;  . PERIPHERAL VASCULAR CATHETERIZATION N/A 03/31/2016   Procedure: Dialysis/Perma Catheter Removal;  Surgeon: Katha Cabal, MD;  Location: [place] CV LAB;  Service: Cardiovascular;  Laterality: N/A;  . PORTACATH PLACEMENT    . TUBAL LIGATION     Social History  Substance Use Topics  . Smoking status: Never Smoker  . Smokeless tobacco: Never Used  . Alcohol use No   Family History  Problem Relation Age of Onset  . Hypercholesterolemia Mother   . Hypertension Mother   . Hypertension Father     Allergies: No Known Allergies  Current antibiotics: Antibiotics Given (last 72 hours)    Date/Time Action Medication Dose Rate   11/14/16 1109 New Bag/Given   vancomycin (VANCOCIN) IVPB 1000 mg/200 mL premix 1,000 mg 200 mL/hr   11/14/16 1109 New Bag/Given   piperacillin-tazobactam (ZOSYN) IVPB 3.375 g 3.375 g 12.5 mL/hr   11/14/16 2112 New Bag/Given   piperacillin-tazobactam (ZOSYN) IVPB 3.375 g 3.375 g 12.5 mL/hr   11/15/16 0856 New Bag/Given   piperacillin-tazobactam (ZOSYN) IVPB 3.375 g 3.375 g 12.5 mL/hr  MEDICATIONS: . aspirin EC  81 mg Oral Daily  . cinacalcet  60 mg Oral Q supper  . cloNIDine  0.1 mg Oral BID  . epoetin (EPOGEN/PROCRIT) injection  10,000 Units Intravenous Q M,W,F-HD  . fentaNYL  25 mcg Transdermal Q72H  . hydrALAZINE  25 mg Oral Q8H  . insulin aspart  0-15 Units Subcutaneous TID WC & HS  . [START ON 11/16/2016] irbesartan  300 mg Oral QHS  . ixazomib citrate  3 mg Oral Weekly  . levETIRAcetam  500 mg Oral Daily   And  . levETIRAcetam  250 mg Oral Once per day on Mon Wed Fri  . mouth rinse  15 mL Mouth Rinse BID  . metoprolol tartrate  25 mg Oral BID  . ondansetron  4 mg Oral Once  . senna  1 tablet Oral Daily   . sodium chloride flush  10-40 mL Intracatheter Q12H  . verapamil  120 mg Oral Daily    Review of Systems - 11 systems reviewed and negative per HPI   OBJECTIVE: Temp:  [98 F (36.7 C)-102.1 F (38.9 C)] 98.8 F (37.1 C) (09/10 1525) Pulse Rate:  [62-102] 82 (09/10 1530) Resp:  [16-21] 20 (09/10 1530) BP: (107-176)/(41-57) 144/57 (09/10 1530) SpO2:  [96 %-100 %] 100 % (09/10 1530) Physical Exam  Constitutional:  oriented to person, place, and time.lethargic and frail appearing  HENT: [place]/AT, PERRLA, no scleral icterus Mouth/Throat: Oropharynx is clear and dry . No oropharyngeal exudate.  Cardiovascular: Normal rate, regular rhythm and normal heart sounds. Pulmonary/Chest: Effort normal and breath sounds normal. No respiratory distress.  has no wheezes.  Neck = supple, no nuchal rigidity R chest wall Portacath in place L ue avf being accessed for HD Abdominal: Soft. Bowel sounds are normal.  exhibits no distension. There is no tenderness.  Lymphadenopathy: no cervical adenopathy. No axillary adenopathy Neurological: alert and oriented to person, place, and time.  Skin: Skin is warm and dry. No rash noted. No erythema.  Psychiatric: a normal mood and affect.  behavior is normal.    LABS: Results for orders placed or performed during the hospital encounter of 11/20/2016 (from the past 48 hour(s))  Glucose, capillary     Status: Abnormal   Collection Time: 11/13/16  4:44 PM  Result Value Ref Range   Glucose-Capillary 129 (H) 65 - 99 mg/dL  Glucose, capillary     Status: Abnormal   Collection Time: 11/13/16  9:12 PM  Result Value Ref Range   Glucose-Capillary 102 (H) 65 - 99 mg/dL  Basic metabolic panel     Status: Abnormal   Collection Time: 11/14/16  5:38 AM  Result Value Ref Range   Sodium 134 (L) 135 - 145 mmol/L   Potassium 4.5 3.5 - 5.1 mmol/L   Chloride 93 (L) 101 - 111 mmol/L   CO2 28 22 - 32 mmol/L   Glucose, Bld 99 65 - 99 mg/dL   BUN 40 (H) 6 - 20 mg/dL    Creatinine, Ser 4.35 (H) 0.44 - 1.00 mg/dL   Calcium 11.4 (H) 8.9 - 10.3 mg/dL   GFR calc non Af Amer 11 (L) >60 mL/min   GFR calc Af Amer 12 (L) >60 mL/min    Comment: (NOTE) The eGFR has been calculated using the CKD EPI equation. This calculation has not been validated in all clinical situations. eGFR's persistently <60 mL/min signify possible Chronic Kidney Disease.    Anion gap 13 5 - 15  Glucose, capillary  Status: Abnormal   Collection Time: 11/14/16  7:31 AM  Result Value Ref Range   Glucose-Capillary 112 (H) 65 - 99 mg/dL  CULTURE, BLOOD (ROUTINE X 2) w Reflex to ID Panel     Status: None (Preliminary result)   Collection Time: 11/14/16  8:26 AM  Result Value Ref Range   Specimen Description BLOOD RIGHT ANTECUBITAL    Special Requests      BOTTLES DRAWN AEROBIC AND ANAEROBIC Blood Culture adequate volume   Culture NO GROWTH < 24 HOURS    Report Status PENDING   CULTURE, BLOOD (ROUTINE X 2) w Reflex to ID Panel     Status: None (Preliminary result)   Collection Time: 11/14/16  8:38 AM  Result Value Ref Range   Specimen Description BLOOD BLOOD RIGHT HAND    Special Requests      BOTTLES DRAWN AEROBIC AND ANAEROBIC Blood Culture adequate volume   Culture NO GROWTH < 24 HOURS    Report Status PENDING   Glucose, capillary     Status: Abnormal   Collection Time: 11/14/16 11:40 AM  Result Value Ref Range   Glucose-Capillary 147 (H) 65 - 99 mg/dL  Glucose, capillary     Status: None   Collection Time: 11/14/16  5:57 PM  Result Value Ref Range   Glucose-Capillary 85 65 - 99 mg/dL  Glucose, capillary     Status: Abnormal   Collection Time: 11/14/16  9:01 PM  Result Value Ref Range   Glucose-Capillary 100 (H) 65 - 99 mg/dL  CBC with Differential/Platelet     Status: Abnormal   Collection Time: 11/15/16  4:35 AM  Result Value Ref Range   WBC 2.9 (L) 3.6 - 11.0 K/uL   RBC 2.37 (L) 3.80 - 5.20 MIL/uL   Hemoglobin 7.1 (L) 12.0 - 16.0 g/dL   HCT 21.1 (L) 35.0 - 47.0 %    MCV 88.9 80.0 - 100.0 fL   MCH 30.2 26.0 - 34.0 pg   MCHC 33.9 32.0 - 36.0 g/dL   RDW 15.2 (H) 11.5 - 14.5 %   Platelets 33 (L) 150 - 440 K/uL   Neutrophils Relative % 83 %   Lymphocytes Relative 7 %   Monocytes Relative 9 %   Eosinophils Relative 1 %   Basophils Relative 0 %   Neutro Abs 2.4 1.4 - 6.5 K/uL   Lymphs Abs 0.2 (L) 1.0 - 3.6 K/uL   Monocytes Absolute 0.3 0.2 - 0.9 K/uL   Eosinophils Absolute 0.0 0 - 0.7 K/uL   Basophils Absolute 0.0 0 - 0.1 K/uL   RBC Morphology SPHEROCYTES     Comment: MIXED RBC POPULATION POLYCHROMASIA PRESENT   Basic metabolic panel     Status: Abnormal   Collection Time: 11/15/16  4:35 AM  Result Value Ref Range   Sodium 138 135 - 145 mmol/L   Potassium 4.6 3.5 - 5.1 mmol/L   Chloride 95 (L) 101 - 111 mmol/L   CO2 30 22 - 32 mmol/L   Glucose, Bld 100 (H) 65 - 99 mg/dL   BUN 23 (H) 6 - 20 mg/dL   Creatinine, Ser 3.15 (H) 0.44 - 1.00 mg/dL   Calcium 11.3 (H) 8.9 - 10.3 mg/dL   GFR calc non Af Amer 16 (L) >60 mL/min   GFR calc Af Amer 18 (L) >60 mL/min    Comment: (NOTE) The eGFR has been calculated using the CKD EPI equation. This calculation has not been validated in all clinical situations. eGFR's persistently <60  mL/min signify possible Chronic Kidney Disease.    Anion gap 13 5 - 15  Glucose, capillary     Status: None   Collection Time: 11/15/16  7:36 AM  Result Value Ref Range   Glucose-Capillary 99 65 - 99 mg/dL  Glucose, capillary     Status: Abnormal   Collection Time: 11/15/16 11:17 AM  Result Value Ref Range   Glucose-Capillary 211 (H) 65 - 99 mg/dL  Renal function panel     Status: Abnormal   Collection Time: 11/15/16 11:49 AM  Result Value Ref Range   Sodium 133 (L) 135 - 145 mmol/L   Potassium 4.5 3.5 - 5.1 mmol/L   Chloride 93 (L) 101 - 111 mmol/L   CO2 28 22 - 32 mmol/L   Glucose, Bld 202 (H) 65 - 99 mg/dL   BUN 28 (H) 6 - 20 mg/dL   Creatinine, Ser 3.93 (H) 0.44 - 1.00 mg/dL   Calcium 11.3 (H) 8.9 - 10.3 mg/dL    Phosphorus 5.8 (H) 2.5 - 4.6 mg/dL   Albumin 2.6 (L) 3.5 - 5.0 g/dL   GFR calc non Af Amer 12 (L) >60 mL/min   GFR calc Af Amer 14 (L) >60 mL/min    Comment: (NOTE) The eGFR has been calculated using the CKD EPI equation. This calculation has not been validated in all clinical situations. eGFR's persistently <60 mL/min signify possible Chronic Kidney Disease.    Anion gap 12 5 - 15  CBC     Status: Abnormal   Collection Time: 11/15/16 11:49 AM  Result Value Ref Range   WBC 2.2 (L) 3.6 - 11.0 K/uL   RBC 2.00 (L) 3.80 - 5.20 MIL/uL   Hemoglobin 6.2 (L) 12.0 - 16.0 g/dL   HCT 17.9 (L) 35.0 - 47.0 %   MCV 89.7 80.0 - 100.0 fL   MCH 31.2 26.0 - 34.0 pg   MCHC 34.8 32.0 - 36.0 g/dL   RDW 15.2 (H) 11.5 - 14.5 %   Platelets 29 (LL) 150 - 440 K/uL    Comment: PLATELET COUNT CONFIRMED BY SMEAR CRITICAL RESULT CALLED TO, READ BACK BY AND VERIFIED WITH: JESSICA CHRISTMAS AT 1241 ON 11/15/16 [place].   Type and screen     Status: None (Preliminary result)   Collection Time: 11/15/16 11:49 AM  Result Value Ref Range   ABO/RH(D) B POS    Antibody Screen POS    Sample Expiration 11/18/2016    Antibody Identification PENDING   Prepare RBC     Status: None (Preliminary result)   Collection Time: 11/15/16 11:49 AM  Result Value Ref Range   Order Confirmation PENDING    No components found for: ESR, C REACTIVE PROTEIN MICRO: Recent Results (from the past 720 hour(s))  MRSA PCR Screening     Status: None   Collection Time: 10/21/16  3:14 AM  Result Value Ref Range Status   MRSA by PCR NEGATIVE NEGATIVE Final    Comment:        The GeneXpert MRSA Assay (FDA approved for NASAL specimens only), is one component of a comprehensive MRSA colonization surveillance program. It is not intended to diagnose MRSA infection nor to guide or monitor treatment for MRSA infections.   MRSA PCR Screening     Status: None   Collection Time: 11/25/2016  3:41 AM  Result Value Ref Range Status   MRSA by PCR  NEGATIVE NEGATIVE Final    Comment:        The GeneXpert MRSA  Assay (FDA approved for NASAL specimens only), is one component of a comprehensive MRSA colonization surveillance program. It is not intended to diagnose MRSA infection nor to guide or monitor treatment for MRSA infections.   Urine Culture     Status: Abnormal   Collection Time: 11/10/16  2:19 AM  Result Value Ref Range Status   Specimen Description URINE, RANDOM  Final   Special Requests NONE  Final   Culture MULTIPLE SPECIES PRESENT, SUGGEST RECOLLECTION (A)  Final   Report Status 11/11/2016 FINAL  Final  CULTURE, BLOOD (ROUTINE X 2) w Reflex to ID Panel     Status: None (Preliminary result)   Collection Time: 11/14/16  8:26 AM  Result Value Ref Range Status   Specimen Description BLOOD RIGHT ANTECUBITAL  Final   Special Requests   Final    BOTTLES DRAWN AEROBIC AND ANAEROBIC Blood Culture adequate volume   Culture NO GROWTH < 24 HOURS  Final   Report Status PENDING  Incomplete  CULTURE, BLOOD (ROUTINE X 2) w Reflex to ID Panel     Status: None (Preliminary result)   Collection Time: 11/14/16  8:38 AM  Result Value Ref Range Status   Specimen Description BLOOD BLOOD RIGHT HAND  Final   Special Requests   Final    BOTTLES DRAWN AEROBIC AND ANAEROBIC Blood Culture adequate volume   Culture NO GROWTH < 24 HOURS  Final   Report Status PENDING  Incomplete    IMAGING: Dg Chest 2 View  Result Date: 11/14/2016 CLINICAL DATA:  Patient with shortness of breath. History of multiple myeloma. End-stage renal disease. EXAM: CHEST  2 VIEW COMPARISON:  Chest radiograph 11/11/2016 FINDINGS: Right anterior chest wall Port-A-Cath is present with tip projecting over the superior vena cava. Monitoring leads overlie the patient. Stable cardiomegaly. Right-greater-than-left diffuse interstitial pulmonary opacities. Moderate right and small left layering pleural effusions. No pneumothorax. IMPRESSION: Cardiomegaly with pulmonary edema and  layering bilateral pleural effusions. Electronically Signed   By: Lovey Newcomer M.D.   On: 11/14/2016 10:49   Dg Knee 1-2 Views Left  Result Date: 11/15/2016 CLINICAL DATA:  Pain.  History of multiple myeloma EXAM: LEFT KNEE - 1-2 VIEW COMPARISON:  None. FINDINGS: Frontal lateral views were obtained it. There is no appreciable fracture or dislocation. No evident joint effusion. Joint spaces appear normal. There is slight spurring medially. No blastic or lytic bone lesions are evident. IMPRESSION: Mild spurring medially. No appreciable joint space narrowing or erosion. No fracture or joint effusion. Electronically Signed   By: Lowella Grip III M.D.   On: 11/15/2016 13:42   Dg Knee 1-2 Views Right  Result Date: 11/15/2016 CLINICAL DATA:  Pain.  History of multiple myeloma EXAM: RIGHT KNEE - 1-2 VIEW COMPARISON:  October 22, 2016 FINDINGS: Frontal lateral views were obtained. There is a moderate joint effusion. No fracture or dislocation. There is a lytic appearing lesion distal femoral diaphysis measuring 1.2 x 0.4 cm. There is subtle lucency in the proximal tibial diaphysis as well. There is periosteal reaction along the medial proximal tibia without apparent fracture in this area. There is subtle nearby lucency. IMPRESSION: 1.  Joint effusion. 2.  Areas of lucency, potentially representing multiple myeloma. 3.  No acute appearing fracture or dislocation. 4. Periosteal reaction along the medial proximal tibial metaphysis with nearby lucency. Question traumatic etiology versus potential reaction to neoplasm or subtle osteomyelitis. This finding may warrant MRI of the right knee to further evaluate the marrow in this region. Electronically Signed  By: Lowella Grip III M.D.   On: 11/15/2016 13:46   Dg Knee 1-2 Views Right  Result Date: 10/22/2016 CLINICAL DATA:  Right knee pain without known injury. EXAM: RIGHT KNEE - 1-2 VIEW COMPARISON:  None. FINDINGS: No evidence of fracture, dislocation, or  joint effusion. Mild narrowing of medial joint space is noted. Soft tissues are unremarkable. IMPRESSION: Mild degenerative joint disease. No acute abnormality seen in the right knee. Electronically Signed   By: Marijo Conception, M.D.   On: 10/22/2016 10:27   Ct Head Wo Contrast  Result Date: 11/27/2016 CLINICAL DATA:  Altered level of consciousness.  Seizure. EXAM: CT HEAD WITHOUT CONTRAST TECHNIQUE: Contiguous axial images were obtained from the base of the skull through the vertex without intravenous contrast. COMPARISON:  Brain MRI 04/30/2016, head CT 12/08/2015 FINDINGS: Brain: Symmetric low-density within bilateral posterior parietooccipital lobes. No evidence of acute hemorrhage. No hydrocephalus, midline shift or mass effect. No subdural or extra-axial fluid collection. Vascular:  No hyperdense vessel. Skull: Suggestion of diffuse sclerosis. Subcentimeter lytic lesion in the high right frontoparietal calvarium is new from prior exam. Subcentimeter lytic lesion in the left frontal bone is also new. Unchanged lytic skullbase lesion. Smaller subcentimeter lesions in the left frontal bone are unchanged from previous. Sinuses/Orbits: Paranasal sinuses and mastoid air cells are clear. The visualized orbits are unremarkable. Other: None. IMPRESSION: 1. Symmetric low-density within posterior parietooccipital lobes suspicious for PRES (posterior reversible encephalopathy syndrome). Consider MRI for further evaluation. 2. Subcentimeter calvarial lesions in right and left frontal bone there are new from October 2017 head CT likely related to myeloma. Electronically Signed   By: Jeb Levering M.D.   On: 11/12/2016 02:25   Mr Brain Wo Contrast  Result Date: 11/09/2016 CLINICAL DATA:  51 y/o  F; seizure episodes with hypertension. EXAM: MRI HEAD WITHOUT CONTRAST TECHNIQUE: Multiplanar, multiecho pulse sequences of the brain and surrounding structures were obtained without intravenous contrast. COMPARISON:   11/29/2016 CT head.  04/30/2016 MRI head. FINDINGS: Brain: T2 FLAIR hyperintense signal abnormality involving cortex and subcortical white matter with a predominantly posterior distribution in the supratentorial brain, typical of PRES. No reduced diffusion to suggest acute or early subacute infarction. No abnormal susceptibility hypointensity to indicate intracranial hemorrhage. There is cortical swelling with mild local mass effect associated with the areas of T2 FLAIR signal abnormality. No effacement of basilar cisterns, herniation, or extra-axial collection. Mild diffuse brain parenchymal volume loss. Vascular: Normal flow voids. 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 Sinuses/Orbits: Negative. Other: None. IMPRESSION: 1. Findings typical of acute hypertensive encephalopathy, PRES, involving the posterior supratentorial brain. 2. No evidence for acute infarction, hemorrhage, or significant mass effect. 3. Mild brain parenchymal volume loss. Electronically Signed   By: Kristine Garbe M.D.   On: 11/09/2016 03:09   Dg Chest Port 1 View  Result Date: 11/11/2016 CLINICAL DATA:  Shortness of breath. EXAM: PORTABLE CHEST 1 VIEW COMPARISON:  November 08, 2016 FINDINGS: The distal tip of the right Port-A-Cath terminates near the caval atrial junction. Increasing bilateral pulmonary opacities and probable effusions, left greater than right are identified. No pneumothorax. No other changes. IMPRESSION: Suspected developing effusions and edema. An infectious etiology is considered less likely. Electronically Signed   By: Dorise Bullion III M.D   On: 11/11/2016 16:18   Dg Chest Port 1 View  Result Date: 11/26/2016 CLINICAL DATA:  Cough.  Seizure-like activity. EXAM: PORTABLE CHEST 1 VIEW COMPARISON:  Chest  radiograph 05/26/2016. FINDINGS: Tip of the right chest port in the distal SVC. Heart size upper normal. Mediastinal  contours are normal. Improved bilateral pleural effusions from prior exam, suspect small volume residual or recurrent pleural fluid. Improved pulmonary edema from prior exam. Bony sclerosis with remote bilateral rib fractures, likely related to myeloma. Lytic lesion in the right clavicle. IMPRESSION: 1. Small pleural effusions, improved from prior exam, may be persistent or recurrent. Improved pulmonary edema, mild residual or recurrent vascular congestion. 2. Bony scleroses and bilateral rib fractures likely related to myeloma. Electronically Signed   By: Jeb Levering M.D.   On: 11/26/2016 02:36    Assessment:   Ranesha Val is a 51 y.o. female with ESRD and advanced myeloma admitted with seizures and HTN emergency with PRES, now on hospital day 6 with with fevers, knee pain and progressive lymphopenia, anemia and TCP.   She is on [place] for MM (has been associated with cytopenias and  PRES.  Recently started on keppra so drug fever possible although less likely.  Multiple possible etiologies including line infection with portacath, PNA, bacteremia.  ? If delayed transfusion reaction possible - received PRBC  Recommendations Continue current abx - vanco and zosyn  Await bcx results If recurrent fever will need likely CT chest  Consult oncology  Thank you very much for allowing me to participate in the care of this patient. Please call with questions.   Cheral Marker. Ola Spurr, MD

## 2016-11-15 NOTE — Progress Notes (Signed)
Assessment and educated completed using interpreter [phone].   Update: Patient reassessed and educated on cooling blanket using interpretor [phone]

## 2016-11-15 NOTE — Evaluation (Signed)
Physical Therapy Evaluation Patient Details Name: Jamie Keller MRN: [id number] DOB: 11/07/1965 Today's Date: 11/15/2016   History of Present Illness  Jamie Keller  is a 51 y.o. female with a known history of anemia, end-stage renal disease on dialysis, multiple myeloma, diabetes mellitus, neuropathy, osteoarthritis presented to the emergency room from home, after family witnessed seizure. Patient had 2 episodes of seizures at home. She also had vomiting. EMS was called for the seizures and blood pressure was noted to be elevated around systolic to 932 mmHg. Patient was given IV Ativan for seizures. Patient was sedated and lethargic in the ED. She gets dialyzed on Monday Wednesday and Friday. No history of any seizures in the past. In the emergency room, patient's blood pressure was high and she was started on IV nicardipine drip. At time of PT evaluation she is admitted to 2A. Pt is very lethargic and is AOx1 at time of evaluation. History obtained from medical record.   Clinical Impression  Pt admitted with above diagnosis. Pt currently with functional limitations due to the deficits listed below (see PT Problem List). Patient is very lethargic and weak today. She falls asleep frequently throughout the evaluation and is unable to provide history so information is obtained from the medical record. No family present. She requires max assistance to get to the edge of bed. Initially unsteady in sitting requiring min to modA+1 support but with extended time sitting is able to remain upright with contact-guard only. Able to except only very minimal challenge to balance in sitting. Patient is too weak/unsafe to attempt standing today with therapist. After returning to supine attempted to guide patient in supine exercises however she continues to fall asleep and is unable to follow commands. She will need placement at skilled nursing facility following discharge in order to facilitate safe return  home. Pt will benefit from PT services to address deficits in strength, balance, and mobility in order to return to full function at home.     Follow Up Recommendations SNF    Equipment Recommendations  None recommended by PT    Recommendations for Other Services       Precautions / Restrictions Precautions Precautions: Fall Restrictions Weight Bearing Restrictions: No      Mobility  Bed Mobility Overal bed mobility: Needs Assistance Bed Mobility: Supine to Sit;Sit to Supine     Supine to sit: Max assist Sit to supine: Max assist   General bed mobility comments: Patient requires max assistance to get to the edge of bed. Initially unsteady in sitting requiring min to modA+1 support but with extended time sitting is able to remain upright with contact-guard only. Able to except only very minimal challenge to balance in sitting.  Transfers                 General transfer comment: Patient is too weak to attempt transfers or ambulation at this time.   Ambulation/Gait                Stairs            Wheelchair Mobility    Modified Rankin (Stroke Patients Only)       Balance Overall balance assessment: Needs assistance Sitting-balance support: No upper extremity supported;Feet supported Sitting balance-Leahy Scale: Fair         Standing balance comment: Unable to attempt during evaluation  Pertinent Vitals/Pain Pain Assessment: 0-10 Pain Score: 7  Pain Location: R knee Pain Descriptors / Indicators: Aching Pain Intervention(s): Limited activity within patient's tolerance    Home Living Family/patient expects to be discharged to:: Private residence Living Arrangements: Spouse/significant other Available Help at Discharge: Family;Available PRN/intermittently Type of Home: House Home Access: Stairs to enter Entrance Stairs-Rails: None Entrance Stairs-Number of Steps: 2 Home Layout: One level Home  Equipment: Walker - 4 wheels      Prior Function Level of Independence: Independent with assistive device(s)         Comments: Mod indep with [id number] for household and limited community mobility; indep with ADLs.  Denies fall history.  Husband assists with transport to/from dialysis.     Hand Dominance        Extremity/Trunk Assessment   Upper Extremity Assessment Upper Extremity Assessment: Generalized weakness;RUE deficits/detail RUE Deficits / Details: Bilateral shoulder flexion weakness <3/5, poor command follow. Weak elbow flexion/extension and poor grip strength    Lower Extremity Assessment Lower Extremity Assessment: Generalized weakness;RLE deficits/detail RLE Deficits / Details: Pt requires assist for SLR in sitting. Unable to perform LAQ due to weakness. Knee extension strength is <3/5. Poor command follow with strength testing       Communication   Communication: Interpreter utilized Visual merchandiser interpreter utilized)  Cognition Arousal/Alertness: Lethargic Behavior During Therapy: Flat affect Overall Cognitive Status: No family/caregiver present to determine baseline cognitive functioning Area of Impairment: Orientation                 Orientation Level: Disoriented to;Place;Time;Situation                    General Comments      Exercises     Assessment/Plan    PT Assessment Patient needs continued PT services  PT Problem List Decreased strength;Decreased range of motion;Decreased activity tolerance;Decreased balance;Decreased mobility;Decreased coordination;Decreased knowledge of use of DME;Decreased knowledge of precautions;Decreased safety awareness;Pain;Decreased cognition       PT Treatment Interventions DME instruction;Gait training;Stair training;Functional mobility training;Therapeutic activities;Therapeutic exercise;Patient/family education;Cognitive remediation;Neuromuscular re-education;Balance training    PT Goals (Current goals can  be found in the Care Plan section)  Acute Rehab PT Goals PT Goal Formulation: Patient unable to participate in goal setting    Frequency Min 2X/week   Barriers to discharge        Co-evaluation               AM-PAC PT "6 Clicks" Daily Activity  Outcome Measure Difficulty turning over in bed (including adjusting bedclothes, sheets and blankets)?: Unable Difficulty moving from lying on back to sitting on the side of the bed? : Unable Difficulty sitting down on and standing up from a chair with arms (e.g., wheelchair, bedside commode, etc,.)?: Unable Help needed moving to and from a bed to chair (including a wheelchair)?: Total Help needed walking in hospital room?: Total Help needed climbing 3-5 steps with a railing? : Total 6 Click Score: 6    End of Session Equipment Utilized During Treatment: Gait belt Activity Tolerance: Patient limited by fatigue;Patient limited by lethargy Patient left: in bed;with call bell/phone within reach;with bed alarm set Nurse Communication: Mobility status PT Visit Diagnosis: Muscle weakness (generalized) (M62.81);Difficulty in walking, not elsewhere classified (R26.2);Pain Pain - Right/Left: Right Pain - part of body: Knee    Time: 1055-1110 PT Time Calculation (min) (ACUTE ONLY): 15 min   Charges:   PT Evaluation $PT Eval Moderate Complexity: 1 Mod     PT  G Codes:   PT G-Codes **NOT FOR INPATIENT CLASS** Functional Assessment Tool Used: AM-PAC 6 Clicks Basic Mobility Functional Limitation: Mobility: Walking and moving around Mobility: Walking and Moving Around Current Status (C1798): 100 percent impaired, limited or restricted Mobility: Walking and Moving Around Goal Status (V0254): At least 40 percent but less than 60 percent impaired, limited or restricted    Phillips Grout PT, DPT    Huprich,Jason 11/15/2016, 11:46 AM

## 2016-11-15 NOTE — Progress Notes (Signed)
Pre hd assessment  

## 2016-11-15 NOTE — Plan of Care (Signed)
Problem: Activity: Goal: Risk for activity intolerance will decrease Outcome: Not Progressing Patient tires very easily; too weak to get out of bed.

## 2016-11-15 NOTE — Progress Notes (Signed)
Post hd assessment 

## 2016-11-15 NOTE — Progress Notes (Signed)
Pre hd info 

## 2016-11-15 NOTE — Progress Notes (Signed)
  End of hd 

## 2016-11-15 NOTE — Progress Notes (Signed)
[facility]  ROUNDING NOTE   Subjective:   Patient denies any acute c/o. No SOB this morning. Seems to be tolerating clears.  History taken with assistance of Spanish Interpreter.  Mental status is still not back to baseline as she has moments of confusion T Max 101.9  Objective:  Vital signs in last 24 hours:  Temp:  [98 F (36.7 C)-102.1 F (38.9 C)] 99.2 F (37.3 C) (09/10 1300) Pulse Rate:  [56-102] 89 (09/10 1300) Resp:  [15-19] 18 (09/10 1300) BP: (101-176)/(41-57) 150/51 (09/10 1300) SpO2:  [96 %-100 %] 98 % (09/10 1300)  Weight change:  Filed Weights   11/10/16 0957 11/10/16 1258 11/11/16 1712  Weight: 50.9 kg (112 lb 3.4 oz) 50.9 kg (112 lb 3.4 oz) 50.9 kg (112 lb 3.4 oz)    Intake/Output: I/O last 3 completed shifts: In: 60 [IV Piggyback:50] Out: 2000 [Other:2000]   Intake/Output this shift:  No intake/output data recorded.  Physical Exam: General: ill appearing   Head: Normocephalic, atraumatic. Moist oral mucosal membranes  Eyes: Anicteric  Neck: Supple, trachea midline  Lungs:  Bilateral crackles  Heart: S1S2 no rubs  Abdomen:  Soft, nontender, bowel sounds present  Extremities: No peripheral edema.  Neurologic: Alert, able to answer questions  Skin: No lesions  Access: LUE AVF,     Basic Metabolic Panel:  Recent Labs Lab 11/10/16 1000 11/12/16 0429 11/14/16 0538 11/15/16 0435 11/15/16 1149  NA 136 138 134* 138 133*  K 3.5 3.6 4.5 4.6 4.5  CL 101 95* 93* 95* 93*  CO2 _0 GLUCOSE 141* 124* 99 100* 202*  BUN 43* 24* 40* 23* 28*  CREATININE 5.00* 2.90* 4.35* 3.15* 3.93*  CALCIUM 10.6* 11.1* 11.4* 11.3* 11.3*  PHOS 5.7*  --   --   --  5.8*    Liver Function Tests:  Recent Labs Lab 11/10/16 1000 11/15/16 1149  ALBUMIN 2.4* 2.6*   No results for input(s): LIPASE, AMYLASE in the last 168 hours. No results for input(s): AMMONIA in the last 168 hours.  CBC:  Recent Labs Lab 11/09/16 0335 11/10/16 1000  11/12/16 0429 11/15/16 0435 11/15/16 1149  WBC 6.1 5.4 5.3 2.9* 2.2*  NEUTROABS 5.0  --   --  2.4  --   HGB 8.9* 8.2* 8.6* 7.1* 6.2*  HCT 25.7* 24.3* 25.1* 21.1* 17.9*  MCV 88.5 88.8 89.3 88.9 89.7  PLT 103* 93* 80* 33* 29*    Cardiac Enzymes: No results for input(s): CKTOTAL, CKMB, CKMBINDEX, TROPONINI in the last 168 hours.  BNP: Invalid input(s): POCBNP  CBG:  Recent Labs Lab 11/14/16 1140 11/14/16 1757 11/14/16 2101 11/15/16 0736 11/15/16 1117  GLUCAP 147* 85 100* 99 211*    Microbiology: Results for orders placed or performed during the hospital encounter of 11/20/2016  MRSA PCR Screening     Status: None   Collection Time: 11/30/2016  3:41 AM  Result Value Ref Range Status   MRSA by PCR NEGATIVE NEGATIVE Final    Comment:        The GeneXpert MRSA Assay (FDA approved for NASAL specimens only), is one component of a comprehensive MRSA colonization surveillance program. It is not intended to diagnose MRSA infection nor to guide or monitor treatment for MRSA infections.   Urine Culture     Status: Abnormal   Collection Time: 11/10/16  2:19 AM  Result Value Ref Range Status   Specimen Description URINE, RANDOM  Final   Special Requests NONE  Final   Culture MULTIPLE SPECIES PRESENT, SUGGEST RECOLLECTION (A)  Final   Report Status 11/11/2016 FINAL  Final  CULTURE, BLOOD (ROUTINE X 2) w Reflex to ID Panel     Status: None (Preliminary result)   Collection Time: 11/14/16  8:26 AM  Result Value Ref Range Status   Specimen Description BLOOD RIGHT ANTECUBITAL  Final   Special Requests   Final    BOTTLES DRAWN AEROBIC AND ANAEROBIC Blood Culture adequate volume   Culture NO GROWTH < 24 HOURS  Final   Report Status PENDING  Incomplete  CULTURE, BLOOD (ROUTINE X 2) w Reflex to ID Panel     Status: None (Preliminary result)   Collection Time: 11/14/16  8:38 AM  Result Value Ref Range Status   Specimen Description BLOOD BLOOD RIGHT HAND  Final   Special Requests    Final    BOTTLES DRAWN AEROBIC AND ANAEROBIC Blood Culture adequate volume   Culture NO GROWTH < 24 HOURS  Final   Report Status PENDING  Incomplete    Coagulation Studies: No results for input(s): LABPROT, INR in the last 72 hours.  Urinalysis: No results for input(s): COLORURINE, LABSPEC, PHURINE, GLUCOSEU, HGBUR, BILIRUBINUR, KETONESUR, PROTEINUR, UROBILINOGEN, NITRITE, LEUKOCYTESUR in the last 72 hours.  Invalid input(s): APPERANCEUR    Imaging: Dg Chest 2 View  Result Date: 11/14/2016 CLINICAL DATA:  Patient with shortness of breath. History of multiple myeloma. End-stage renal disease. EXAM: CHEST  2 VIEW COMPARISON:  Chest radiograph 11/11/2016 FINDINGS: Right anterior chest wall Port-A-Cath is present with tip projecting over the superior vena cava. Monitoring leads overlie the patient. Stable cardiomegaly. Right-greater-than-left diffuse interstitial pulmonary opacities. Moderate right and small left layering pleural effusions. No pneumothorax. IMPRESSION: Cardiomegaly with pulmonary edema and layering bilateral pleural effusions. Electronically Signed   By: Lovey Newcomer M.D.   On: 11/14/2016 10:49   Dg Knee 1-2 Views Left  Result Date: 11/15/2016 CLINICAL DATA:  Pain.  History of multiple myeloma EXAM: LEFT KNEE - 1-2 VIEW COMPARISON:  None. FINDINGS: Frontal lateral views were obtained it. There is no appreciable fracture or dislocation. No evident joint effusion. Joint spaces appear normal. There is slight spurring medially. No blastic or lytic bone lesions are evident. IMPRESSION: Mild spurring medially. No appreciable joint space narrowing or erosion. No fracture or joint effusion. Electronically Signed   By: Lowella Grip III M.D.   On: 11/15/2016 13:42   Dg Knee 1-2 Views Right  Result Date: 11/15/2016 CLINICAL DATA:  Pain.  History of multiple myeloma EXAM: RIGHT KNEE - 1-2 VIEW COMPARISON:  October 22, 2016 FINDINGS: Frontal lateral views were obtained. There is a  moderate joint effusion. No fracture or dislocation. There is a lytic appearing lesion distal femoral diaphysis measuring 1.2 x 0.4 cm. There is subtle lucency in the proximal tibial diaphysis as well. There is periosteal reaction along the medial proximal tibia without apparent fracture in this area. There is subtle nearby lucency. IMPRESSION: 1.  Joint effusion. 2.  Areas of lucency, potentially representing multiple myeloma. 3.  No acute appearing fracture or dislocation. 4. Periosteal reaction along the medial proximal tibial metaphysis with nearby lucency. Question traumatic etiology versus potential reaction to neoplasm or subtle osteomyelitis. This finding may warrant MRI of the right knee to further evaluate the marrow in this region. Electronically Signed   By: Lowella Grip III M.D.   On: 11/15/2016 13:46     Medications:   . sodium chloride    . albumin human    .  piperacillin-tazobactam (ZOSYN)  IV Stopped (11/15/16 1256)  . vancomycin     . aspirin EC  81 mg Oral Daily  . cinacalcet  60 mg Oral Q supper  . cloNIDine  0.1 mg Oral BID  . epoetin (EPOGEN/PROCRIT) injection  10,000 Units Intravenous Q M,W,F-HD  . fentaNYL  25 mcg Transdermal Q72H  . hydrALAZINE  25 mg Oral Q8H  . insulin aspart  0-15 Units Subcutaneous TID WC & HS  . irbesartan  300 mg Oral Daily  . ixazomib citrate  3 mg Oral Weekly  . levETIRAcetam  500 mg Oral Daily   And  . levETIRAcetam  250 mg Oral Once per day on Mon Wed Fri  . mouth rinse  15 mL Mouth Rinse BID  . metoprolol tartrate  25 mg Oral BID  . ondansetron  4 mg Oral Once  . senna  1 tablet Oral Daily  . sodium chloride flush  10-40 mL Intracatheter Q12H  . verapamil  120 mg Oral Daily   acetaminophen **OR** acetaminophen, albuterol, labetalol, LORazepam, ondansetron **OR** ondansetron (ZOFRAN) IV, oxyCODONE, prochlorperazine, sodium chloride flush  Assessment/ Plan:  51 y.o.Hispanic Spanish Speaking only female with diabetes mellitus  type II, hypertension, multiple myeloma, ESRD on hemodialysis.   [facility] MWF [address] AVF  1. End Stage Renal Disease: ESRD secondary to multiple myeloma.  CXR with pulmonary edema. Patient now requiring oxygen.  Hemodialysis treatment 9/5, 9/6, 9/7, 9/8. Total UF of 5.5 liters.  - Hemodialysis for today to get back on regular schedule.   2. Anemia of chronic kidney disease with multiple myeloma:  Hemoglobin 6.2, with low platelets and WBC - EPO with HD treatment MWF - blood transfusion planned for today  3. Hypertension with posterior reversible encephalopathy syndrome (PRES) on MRI Nicardipine gtt on admission.  - Home regimen of verapamil.  - Started irbesartan on this admission.   4. Secondary Hyperparathyroidism:  Calcium elevated at 11.9 on admission. Calcium elevated 11.3 Secondary to her underlying multiple myeloma. Phosphorus at goal.  - restarted low dose cinacalcet on this admission, increase to 20m daily today  5. Fever  - ? Infectious source ? Port    LOS: 7 Shiara Mcgough 9/10/20181:58 PM

## 2016-11-16 DIAGNOSIS — Z8619 Personal history of other infectious and parasitic diseases: Secondary | ICD-10-CM

## 2016-11-16 DIAGNOSIS — N186 End stage renal disease: Secondary | ICD-10-CM

## 2016-11-16 DIAGNOSIS — R569 Unspecified convulsions: Secondary | ICD-10-CM

## 2016-11-16 DIAGNOSIS — I129 Hypertensive chronic kidney disease with stage 1 through stage 4 chronic kidney disease, or unspecified chronic kidney disease: Secondary | ICD-10-CM

## 2016-11-16 DIAGNOSIS — Z992 Dependence on renal dialysis: Secondary | ICD-10-CM

## 2016-11-16 DIAGNOSIS — Z8701 Personal history of pneumonia (recurrent): Secondary | ICD-10-CM

## 2016-11-16 DIAGNOSIS — F419 Anxiety disorder, unspecified: Secondary | ICD-10-CM

## 2016-11-16 DIAGNOSIS — C9000 Multiple myeloma not having achieved remission: Secondary | ICD-10-CM

## 2016-11-16 DIAGNOSIS — Z79899 Other long term (current) drug therapy: Secondary | ICD-10-CM

## 2016-11-16 DIAGNOSIS — F329 Major depressive disorder, single episode, unspecified: Secondary | ICD-10-CM

## 2016-11-16 DIAGNOSIS — D709 Neutropenia, unspecified: Secondary | ICD-10-CM

## 2016-11-16 DIAGNOSIS — Z9221 Personal history of antineoplastic chemotherapy: Secondary | ICD-10-CM

## 2016-11-16 DIAGNOSIS — G629 Polyneuropathy, unspecified: Secondary | ICD-10-CM

## 2016-11-16 DIAGNOSIS — D61818 Other pancytopenia: Secondary | ICD-10-CM

## 2016-11-16 DIAGNOSIS — Z8719 Personal history of other diseases of the digestive system: Secondary | ICD-10-CM

## 2016-11-16 DIAGNOSIS — Z7982 Long term (current) use of aspirin: Secondary | ICD-10-CM

## 2016-11-16 DIAGNOSIS — M1990 Unspecified osteoarthritis, unspecified site: Secondary | ICD-10-CM

## 2016-11-16 LAB — CBC WITH DIFFERENTIAL/PLATELET
BASOS ABS: 0 10*3/uL (ref 0–0.1)
Basophils Relative: 0 %
EOS PCT: 1 %
Eosinophils Absolute: 0 10*3/uL (ref 0–0.7)
HEMATOCRIT: 17.4 % — AB (ref 35.0–47.0)
Hemoglobin: 5.9 g/dL — ABNORMAL LOW (ref 12.0–16.0)
LYMPHS PCT: 14 %
Lymphs Abs: 0.4 10*3/uL — ABNORMAL LOW (ref 1.0–3.6)
MCH: 29.7 pg (ref 26.0–34.0)
MCHC: 33.9 g/dL (ref 32.0–36.0)
MCV: 87.7 fL (ref 80.0–100.0)
MONO ABS: 0.4 10*3/uL (ref 0.2–0.9)
MONOS PCT: 14 %
Neutro Abs: 1.9 10*3/uL (ref 1.4–6.5)
Neutrophils Relative %: 71 %
PLATELETS: 20 10*3/uL — AB (ref 150–440)
RBC: 1.98 MIL/uL — ABNORMAL LOW (ref 3.80–5.20)
RDW: 15.5 % — AB (ref 11.5–14.5)
WBC: 2.7 10*3/uL — ABNORMAL LOW (ref 3.6–11.0)

## 2016-11-16 LAB — PREPARE RBC (CROSSMATCH)

## 2016-11-16 LAB — GLUCOSE, CAPILLARY
GLUCOSE-CAPILLARY: 129 mg/dL — AB (ref 65–99)
GLUCOSE-CAPILLARY: 131 mg/dL — AB (ref 65–99)
GLUCOSE-CAPILLARY: 105 mg/dL — AB (ref 65–99)
Glucose-Capillary: 148 mg/dL — ABNORMAL HIGH (ref 65–99)

## 2016-11-16 LAB — HEMOGLOBIN AND HEMATOCRIT, BLOOD
HCT: 22.4 % — ABNORMAL LOW (ref 35.0–47.0)
Hemoglobin: 7.7 g/dL — ABNORMAL LOW (ref 12.0–16.0)

## 2016-11-16 MED ORDER — IBUPROFEN 400 MG PO TABS
400.0000 mg | ORAL_TABLET | Freq: Once | ORAL | Status: AC
Start: 2016-11-16 — End: 2016-11-16
  Administered 2016-11-16: 400 mg via ORAL
  Filled 2016-11-16: qty 1

## 2016-11-16 MED ORDER — SODIUM CHLORIDE 0.9 % IV SOLN: Freq: Once | INTRAVENOUS | Status: DC

## 2016-11-16 MED ORDER — DIPHENHYDRAMINE HCL 50 MG/ML IJ SOLN
12.5000 mg | Freq: Once | INTRAMUSCULAR | Status: AC
  Administered 2016-11-16: 12.5 mg via INTRAVENOUS
  Filled 2016-11-16: qty 1

## 2016-11-16 NOTE — Progress Notes (Signed)
[facility] NAME: Jamie Keller    MR#:  [id number]  DATE OF BIRTH:  [birth year]    CHIEF COMbloINT:   Chief Complaint  Patient presents with  . Seizures  Patient is very weak  REVIEW OF SYSTEMS:  Review of Systems  Constitutional: Positive for fever. Negative for chills.  HENT: Negative for hearing loss.   Eyes: Negative for blurred vision, double vision and photophobia.  Respiratory: Negative for cough, hemoptysis and shortness of breath.   Cardiovascular: Negative for palpitations, orthopnea and leg swelling.  Gastrointestinal: Negative for abdominal pain, diarrhea and vomiting.  Genitourinary: Negative for dysuria and urgency.  Musculoskeletal: Positive for joint pain. Negative for myalgias and neck pain.  Skin: Negative for rash.  Neurological: Negative for dizziness, focal weakness, seizures, weakness and headaches.  Psychiatric/Behavioral: Negative for memory loss. The patient does not have insomnia.     DRUG ALLERGIES:  No Known Allergies VITALS:  Blood pressure (!) 94/39, pulse (!) 58, temperature 97.8 F (36.6 C), temperature source Oral, resp. rate 16, height 5' (1.524 m), weight 50.9 kg (112 lb 3.4 oz), last menstrual period 02/25/1995, SpO2 100 %. PHYSICAL EXAMINATION:  Physical Exam  Constitutional:  Alert, awake, oriented.  HENT:  Head: Normocephalic.  Mouth/Throat: Oropharynx is clear and moist.  Eyes: No scleral icterus.  Neck: Neck supple. No JVD present. No tracheal deviation present.  Cardiovascular: Normal rate, regular rhythm and normal heart sounds.  Exam reveals no gallop.   No murmur heard. Pulmonary/Chest: Effort normal and breath sounds normal. No respiratory distress. She has no wheezes. She has no rales.  Abdominal: Soft. Bowel sounds are normal. She exhibits no distension. There is no tenderness. There is no rebound.  Musculoskeletal: She exhibits no edema or tenderness.    Bilateral knee tenderness is present  Neurological: No cranial nerve deficit.  .  Skin: No rash noted. No erythema.   LABORATORY PANEL:  Female CBC  Recent Labs Lab 11/16/16 0600  WBC 2.7*  HGB 5.9*  HCT 17.4*  PLT 20*   ------------------------------------------------------------------------------------------------------------------ Chemistries   Recent Labs Lab 11/15/16 1149  NA 133*  K 4.5  CL 93*  CO2 28  GLUCOSE 202*  BUN 28*  CREATININE 3.93*  CALCIUM 11.3*  AST 22  ALT 11*  ALKPHOS 102  BILITOT 0.6   RADIOLOGY:  No results found. ASSESSMENT AND PLAN:   IMPRESSION AND PLAN: 51 year old female patient with history of multiple myeloma, end-stage renal disease on dialysis, hypertension presented to the emergency room with seizure and elevated blood pressure.   #fever-etiology unclear Pancultures ordered,blood cultures are negative so far, urine cultures are pending Broad-spectrum IV antibiotics IV Zosyn and vancomycin started 11/14/2016 Chest x-ray with pulmonary edema Infectious Disease recommendations appreciated  #History of multiple myeloma with lytic bony lesions-pancytopenia Patient is complaining bilateral knee pain, could be from underlying multiple myeloma will get x-rays Discussed with Dr. Grayland Ormond who reports that her prognosis is poor with no good treatment options and recommended palliative care consult , palliative care consult is placed  Patient is on fentanyl patch for the pain control.on  ixazonib . Patient has multiple antibodies but okay to transfuse blood per Dr. Grayland Ormond Monitor CBC and including platelet count    #. Hypertensive urgency Clinically improving Continue metoprolol 25 mg , verapamil 120 mg and hydralazine 25 mg every 8 hours. Clonidine is added to the regimen for better control of the blood pressure, titrate as needed. Goal systolic  blood pressure less than 160  #. Posterior reversible encephalopathy syndrome;MRI of  the brain did not show any acute stroke. Showed only PRES>patient is off nicardipine drip. Transferred out of ICU. Patient had respiratory distress and shortness of breath and received emergency hemodialysis yesterday and she will get another hemodialysis today. Blood pressure is still high so increased the dose of metoprolol to 25 mg twice a day, added hydralazine 25 mg 3 times a day.clonidine is added to the regimen  #.New onset seizure; by mouth Keppra.  Renally dose the Keppra.  No other episodes of seizures noticed  # End-stage renal disease on dialysis,nephro is following Patient's shortness of breath improved after 3 consecutive days of hemodialysis Chest x-ray with pulmonary edema on 11/14/2016 had hemodialysis yesterday and scheduled for today Calcium level is high at 11.4,Sensipar dose increased to 60 mg    Prognosis really poor and high risk for cardiac arrest.. . All the records are reviewed and case discussed with Care Management/Social Worker. Management plans discussed with the patient, Son Jamie Keller , and explained patient's poor prognosis with declining clinical status .he is agreeable with palliative care consult   CODE STATUS: Full Code  TOTAL TIME TAKING CARE OF THIS PATIENT: 37 minutes.   More than 50% of the time was spent in counseling/coordination of care: YES  POSSIBLE D/C IN 3 DAYS, DEPENDING ON CLINICAL CONDITION.   Jamie Keller M.D on 11/16/2016 at 5:03 PM  Between 7am to 6pm - Pager - [phone]  After 6pm go to [email or web address] - [facility]

## 2016-11-16 NOTE — Progress Notes (Signed)
[facility]  ROUNDING NOTE   Subjective:   Pt reports she feels a little better compared to yesterday. Is eating food in bed today. Denies being able to transfer to chair. No nausea or vomiting today. Denies shortness of breath. Denies lower extremity swelling. Continues to have nosebleeds.  Hemoglobin not improving, decreased from 6.2 yesterday to 5.9 today Platelets not improving, decreased from 29 yesterday to 20 today   Objective:  Vital signs in last 24 hours:  Temp:  [97.5 F (36.4 C)-100.6 F (38.1 C)] 97.5 F (36.4 C) (09/11 1235) Pulse Rate:  [57-98] 98 (09/11 1255) Resp:  [14-21] 14 (09/11 1255) BP: (87-152)/(35-63) 87/38 (09/11 1255) SpO2:  [92 %-100 %] 100 % (09/11 1255)  Weight change:  Filed Weights   11/10/16 0957 11/10/16 1258 11/11/16 1712  Weight: 50.9 kg (112 lb 3.4 oz) 50.9 kg (112 lb 3.4 oz) 50.9 kg (112 lb 3.4 oz)    Intake/Output: I/O last 3 completed shifts: In: 97 [IV Piggyback:50] Out: 2000 [Other:2000]   Intake/Output this shift:  Total I/O In: 310 [P.O.:240; Blood:70] Out: -   Physical Exam: General: Somewhat improved, sitting and eating in bed  Head: Normocephalic, atraumatic. Moist oral mucosal membranes  Eyes: Anicteric  Neck: Supple, trachea midline, Right IJ port  Lungs:  Clear to auscultation bilaterally, normal respiratory effort  Heart: S1S2 no rubs  Abdomen:  Soft, nontender, bowel sounds present  Extremities: No peripheral edema.  Neurologic: Alert, able to answer questions  Skin: No lesions  Access: LUE AVF    Basic Metabolic Panel:  Recent Labs Lab 11/10/16 1000 11/12/16 0429 11/14/16 0538 11/15/16 0435 11/15/16 1149  NA 136 138 134* 138 133*  K 3.5 3.6 4.5 4.6 4.5  CL 101 95* 93* 95* 93*  CO2 _0 GLUCOSE 141* 124* 99 100* 202*  BUN 43* 24* 40* 23* 28*  CREATININE 5.00* 2.90* 4.35* 3.15* 3.93*  CALCIUM 10.6* 11.1* 11.4* 11.3* 11.3*  PHOS 5.7*  --   --   --  5.8*    Liver Function  Tests:  Recent Labs Lab 11/10/16 1000 11/15/16 1149  AST  --  22  ALT  --  11*  ALKPHOS  --  102  BILITOT  --  0.6  PROT  --  5.2*  ALBUMIN 2.4* 2.4*  2.6*   No results for input(s): LIPASE, AMYLASE in the last 168 hours. No results for input(s): AMMONIA in the last 168 hours.  CBC:  Recent Labs Lab 11/10/16 1000 11/12/16 0429 11/15/16 0435 11/15/16 1149 11/16/16 0600  WBC 5.4 5.3 2.9* 2.2* 2.7*  NEUTROABS  --   --  2.4  --  1.9  HGB 8.2* 8.6* 7.1* 6.2* 5.9*  HCT 24.3* 25.1* 21.1* 17.9* 17.4*  MCV 88.8 89.3 88.9 89.7 87.7  PLT 93* 80* 33* 29* 20*    Cardiac Enzymes: No results for input(s): CKTOTAL, CKMB, CKMBINDEX, TROPONINI in the last 168 hours.  BNP: Invalid input(s): POCBNP  CBG:  Recent Labs Lab 11/15/16 0736 11/15/16 1117 11/15/16 2052 11/16/16 0748 11/16/16 1131  GLUCAP 99 211* 110* 148* 129*    Microbiology: Results for orders placed or performed during the hospital encounter of 11/11/2016  MRSA PCR Screening     Status: None   Collection Time: 12/03/2016  3:41 AM  Result Value Ref Range Status   MRSA by PCR NEGATIVE NEGATIVE Final    Comment:        The GeneXpert MRSA Assay (FDA  approved for NASAL specimens only), is one component of a comprehensive MRSA colonization surveillance program. It is not intended to diagnose MRSA infection nor to guide or monitor treatment for MRSA infections.   Urine Culture     Status: Abnormal   Collection Time: 11/10/16  2:19 AM  Result Value Ref Range Status   Specimen Description URINE, RANDOM  Final   Special Requests NONE  Final   Culture MULTIPLE SPECIES PRESENT, SUGGEST RECOLLECTION (A)  Final   Report Status 11/11/2016 FINAL  Final  CULTURE, BLOOD (ROUTINE X 2) w Reflex to ID Panel     Status: None (Preliminary result)   Collection Time: 11/14/16  8:26 AM  Result Value Ref Range Status   Specimen Description BLOOD RIGHT ANTECUBITAL  Final   Special Requests   Final    BOTTLES DRAWN AEROBIC  AND ANAEROBIC Blood Culture adequate volume   Culture NO GROWTH 2 DAYS  Final   Report Status PENDING  Incomplete  CULTURE, BLOOD (ROUTINE X 2) w Reflex to ID Panel     Status: None (Preliminary result)   Collection Time: 11/14/16  8:38 AM  Result Value Ref Range Status   Specimen Description BLOOD BLOOD RIGHT HAND  Final   Special Requests   Final    BOTTLES DRAWN AEROBIC AND ANAEROBIC Blood Culture adequate volume   Culture NO GROWTH 2 DAYS  Final   Report Status PENDING  Incomplete    Coagulation Studies: No results for input(s): LABPROT, INR in the last 72 hours.  Urinalysis: No results for input(s): COLORURINE, LABSPEC, PHURINE, GLUCOSEU, HGBUR, BILIRUBINUR, KETONESUR, PROTEINUR, UROBILINOGEN, NITRITE, LEUKOCYTESUR in the last 72 hours.  Invalid input(s): APPERANCEUR    Imaging: Dg Knee 1-2 Views Left  Result Date: 11/15/2016 CLINICAL DATA:  Pain.  History of multiple myeloma EXAM: LEFT KNEE - 1-2 VIEW COMPARISON:  None. FINDINGS: Frontal lateral views were obtained it. There is no appreciable fracture or dislocation. No evident joint effusion. Joint spaces appear normal. There is slight spurring medially. No blastic or lytic bone lesions are evident. IMPRESSION: Mild spurring medially. No appreciable joint space narrowing or erosion. No fracture or joint effusion. Electronically Signed   By: Lowella Grip III M.D.   On: 11/15/2016 13:42   Dg Knee 1-2 Views Right  Result Date: 11/15/2016 CLINICAL DATA:  Pain.  History of multiple myeloma EXAM: RIGHT KNEE - 1-2 VIEW COMPARISON:  October 22, 2016 FINDINGS: Frontal lateral views were obtained. There is a moderate joint effusion. No fracture or dislocation. There is a lytic appearing lesion distal femoral diaphysis measuring 1.2 x 0.4 cm. There is subtle lucency in the proximal tibial diaphysis as well. There is periosteal reaction along the medial proximal tibia without apparent fracture in this area. There is subtle nearby lucency.  IMPRESSION: 1.  Joint effusion. 2.  Areas of lucency, potentially representing multiple myeloma. 3.  No acute appearing fracture or dislocation. 4. Periosteal reaction along the medial proximal tibial metaphysis with nearby lucency. Question traumatic etiology versus potential reaction to neoplasm or subtle osteomyelitis. This finding may warrant MRI of the right knee to further evaluate the marrow in this region. Electronically Signed   By: Lowella Grip III M.D.   On: 11/15/2016 13:46     Medications:   . sodium chloride    . albumin human    . piperacillin-tazobactam (ZOSYN)  IV Stopped (11/16/16 1205)  . vancomycin 500 mg (11/15/16 1732)   . aspirin EC  81 mg Oral Daily  .  cinacalcet  60 mg Oral Q supper  . cloNIDine  0.1 mg Oral BID  . epoetin (EPOGEN/PROCRIT) injection  10,000 Units Intravenous Q M,W,F-HD  . fentaNYL  25 mcg Transdermal Q72H  . hydrALAZINE  25 mg Oral Q8H  . insulin aspart  0-15 Units Subcutaneous TID WC & HS  . irbesartan  300 mg Oral QHS  . ixazomib citrate  3 mg Oral Weekly  . levETIRAcetam  500 mg Oral Daily   And  . levETIRAcetam  250 mg Oral Once per day on Mon Wed Fri  . mouth rinse  15 mL Mouth Rinse BID  . metoprolol tartrate  25 mg Oral BID  . ondansetron  4 mg Oral Once  . senna  1 tablet Oral Daily  . sodium chloride flush  10-40 mL Intracatheter Q12H  . verapamil  120 mg Oral Daily   acetaminophen **OR** acetaminophen, albuterol, labetalol, LORazepam, ondansetron **OR** ondansetron (ZOFRAN) IV, oxyCODONE, prochlorperazine, sodium chloride flush  Assessment/ Plan:  51 y.o.Hispanic Spanish Speaking only female with diabetes mellitus type II, hypertension, multiple myeloma, ESRD on hemodialysis.   [facility] MWF [address] AVF  1. End Stage Renal Disease: ESRD secondary to multiple myeloma.  -Will schedule hemodialysis on her regular day tomorrow.  2. Anemia of chronic kidney disease with multiple myeloma:  Hemoglobin 5.9 with low  platelets and WBC - EPO with HD treatment MWF - blood transfusion planned for today  3. Hypertension with posterior reversible encephalopathy syndrome (PRES) on MRI Nicardipine gtt on admission.  - Home regimen of verapamil.  - Started irbesartan on this admission.   4. Secondary Hyperparathyroidism:  Calcium elevated at 11.9 on admission. Calcium elevated 11.3 Secondary to her underlying multiple myeloma. Phosphorus at goal.   5. Fever  - Receiving Zosyn    LOS: 8 Sharnell Knight 9/11/20181:35 PM

## 2016-11-16 NOTE — Consult Note (Signed)
[facility] PROGRESS NOTE Date of Admission:  12/01/2016     ID: Jamie Keller is a 51 y.o. female with  Myeloma, fevers Active Problems:   Hypertensive urgency   Hypercalcemia   Status epilepticus ([place])   Subjective: Fevers seem to be decreasing but hgb remains low, getting another unit. Feels weak. Diaphoretic  ROS  Unable to obtain  Medications:  Antibiotics Given (last 72 hours)    Date/Time Action Medication Dose Rate   11/14/16 1109 New Bag/Given   vancomycin (VANCOCIN) IVPB 1000 mg/200 mL premix 1,000 mg 200 mL/hr   11/14/16 1109 New Bag/Given   piperacillin-tazobactam (ZOSYN) IVPB 3.375 g 3.375 g 12.5 mL/hr   11/14/16 2112 New Bag/Given   piperacillin-tazobactam (ZOSYN) IVPB 3.375 g 3.375 g 12.5 mL/hr   11/15/16 0856 New Bag/Given   piperacillin-tazobactam (ZOSYN) IVPB 3.375 g 3.375 g 12.5 mL/hr   11/15/16 1732 New Bag/Given   vancomycin (VANCOCIN) 500 mg in sodium chloride 0.9 % 100 mL IVPB 500 mg 100 mL/hr   11/15/16 2230 New Bag/Given   piperacillin-tazobactam (ZOSYN) IVPB 3.375 g 3.375 g 12.5 mL/hr   11/16/16 0805 New Bag/Given   piperacillin-tazobactam (ZOSYN) IVPB 3.375 g 3.375 g 12.5 mL/hr     . aspirin EC  81 mg Oral Daily  . cinacalcet  60 mg Oral Q supper  . cloNIDine  0.1 mg Oral BID  . epoetin (EPOGEN/PROCRIT) injection  10,000 Units Intravenous Q M,W,F-HD  . fentaNYL  25 mcg Transdermal Q72H  . hydrALAZINE  25 mg Oral Q8H  . insulin aspart  0-15 Units Subcutaneous TID WC & HS  . irbesartan  300 mg Oral QHS  . ixazomib citrate  3 mg Oral Weekly  . levETIRAcetam  500 mg Oral Daily   And  . levETIRAcetam  250 mg Oral Once per Jamie on Mon Wed Fri  . mouth rinse  15 mL Mouth Rinse BID  . metoprolol tartrate  25 mg Oral BID  . ondansetron  4 mg Oral Once  . senna  1 tablet Oral Daily  . sodium chloride flush  10-40 mL Intracatheter Q12H  . verapamil  120 mg Oral Daily    Objective: Vital signs in last 24 hours: Temp:   [97.5 F (36.4 C)-100.6 F (38.1 C)] 97.8 F (36.6 C) (09/11 1449) Pulse Rate:  [55-98] 55 (09/11 1449) Resp:  [14-21] 14 (09/11 1449) BP: (87-152)/(35-63) 90/36 (09/11 1449) SpO2:  [92 %-100 %] 100 % (09/11 1449) Constitutional:  .lethargic and frail appearing, diaphoretic, being transfuesd HENT: [place]/AT, pale Mouth/Throat: Oropharynx is clear and dry . No oropharyngeal exudate.  Cardiovascular: Normal rate, regular rhythm and normal heart sounds. Pulmonary/Chest: Effort normal and breath sounds normal. No respiratory distress.  has no wheezes.  Neck = supple, no nuchal rigidity R chest wall Portacath in place L ue avf being accessed for HD Abdominal: Soft. Bowel sounds are normal.  exhibits no distension. There is no tenderness.  Lymphadenopathy: no cervical adenopathy. No axillary adenopathy Neurological: alert and oriented to person, place, and time.  Skin: Skin is warm and dry. No rash noted. No erythema.  Psychiatric: a normal mood and affect.  behavior is normal.   Lab Results  Recent Labs  11/15/16 0435 11/15/16 1149 11/16/16 0600  WBC 2.9* 2.2* 2.7*  HGB 7.1* 6.2* 5.9*  HCT 21.1* 17.9* 17.4*  NA 138 133*  --   K 4.6 4.5  --   CL 95* 93*  --   CO2 30 28  --  BUN 23* 28*  --   CREATININE 3.15* 3.93*  --     Microbiology: Results for orders placed or performed during the hospital encounter of 11/24/2016  MRSA PCR Screening     Status: None   Collection Time: 11/24/2016  3:41 AM  Result Value Ref Range Status   MRSA by PCR NEGATIVE NEGATIVE Final    Comment:        The GeneXpert MRSA Assay (FDA approved for NASAL specimens only), is one component of a comprehensive MRSA colonization surveillance program. It is not intended to diagnose MRSA infection nor to guide or monitor treatment for MRSA infections.   Urine Culture     Status: Abnormal   Collection Time: 11/10/16  2:19 AM  Result Value Ref Range Status   Specimen Description URINE, RANDOM  Final    Special Requests NONE  Final   Culture MULTIPLE SPECIES PRESENT, SUGGEST RECOLLECTION (A)  Final   Report Status 11/11/2016 FINAL  Final  CULTURE, BLOOD (ROUTINE X 2) w Reflex to ID Panel     Status: None (Preliminary result)   Collection Time: 11/14/16  8:26 AM  Result Value Ref Range Status   Specimen Description BLOOD RIGHT ANTECUBITAL  Final   Special Requests   Final    BOTTLES DRAWN AEROBIC AND ANAEROBIC Blood Culture adequate volume   Culture NO GROWTH 2 DAYS  Final   Report Status PENDING  Incomplete  CULTURE, BLOOD (ROUTINE X 2) w Reflex to ID Panel     Status: None (Preliminary result)   Collection Time: 11/14/16  8:38 AM  Result Value Ref Range Status   Specimen Description BLOOD BLOOD RIGHT HAND  Final   Special Requests   Final    BOTTLES DRAWN AEROBIC AND ANAEROBIC Blood Culture adequate volume   Culture NO GROWTH 2 DAYS  Final   Report Status PENDING  Incomplete    Studies/Results: Dg Knee 1-2 Views Left  Result Date: 11/15/2016 CLINICAL DATA:  Pain.  History of multiple myeloma EXAM: LEFT KNEE - 1-2 VIEW COMPARISON:  None. FINDINGS: Frontal lateral views were obtained it. There is no appreciable fracture or dislocation. No evident joint effusion. Joint spaces appear normal. There is slight spurring medially. No blastic or lytic bone lesions are evident. IMPRESSION: Mild spurring medially. No appreciable joint space narrowing or erosion. No fracture or joint effusion. Electronically Signed   By: Lowella Grip III M.D.   On: 11/15/2016 13:42   Dg Knee 1-2 Views Right  Result Date: 11/15/2016 CLINICAL DATA:  Pain.  History of multiple myeloma EXAM: RIGHT KNEE - 1-2 VIEW COMPARISON:  October 22, 2016 FINDINGS: Frontal lateral views were obtained. There is a moderate joint effusion. No fracture or dislocation. There is a lytic appearing lesion distal femoral diaphysis measuring 1.2 x 0.4 cm. There is subtle lucency in the proximal tibial diaphysis as well. There is  periosteal reaction along the medial proximal tibia without apparent fracture in this area. There is subtle nearby lucency. IMPRESSION: 1.  Joint effusion. 2.  Areas of lucency, potentially representing multiple myeloma. 3.  No acute appearing fracture or dislocation. 4. Periosteal reaction along the medial proximal tibial metaphysis with nearby lucency. Question traumatic etiology versus potential reaction to neoplasm or subtle osteomyelitis. This finding may warrant MRI of the right knee to further evaluate the marrow in this region. Electronically Signed   By: Lowella Grip III M.D.   On: 11/15/2016 13:46    Assessment/Plan: Jamie Keller is a 51 y.o.  female with ESRD and advanced myeloma admitted with seizures and HTN emergency with PRES, then on hospital Jamie 6 developed  fevers, knee pain and progressive lymphopenia, anemia and TCP.   She is on Ninlaro for MM (has been associated with cytopenias and  PRES). Recently started on keppra so drug fever possible although less likely.  Multiple possible etiologies including line infection with portacath, PNA, bacteremia, acut gout with knee pain. Also question If delayed transfusion reaction possible - received PRBC but HGB remains quite low. Wylie neg, UCX mixed  Recommendations Continue current abx - vanco and zosyn  Await bcx results If recurrent fever will need likely CT chest  Consult oncology. Thank you very much for the consult. Will follow with you.  Leonel Ramsay   11/16/2016, 3:11 PM

## 2016-11-16 NOTE — Care Management (Signed)
Barrier low hemoglobin requiring transfusion.  Platelets remain low.  ID consulted. Palliative consult is pending

## 2016-11-16 NOTE — Consult Note (Signed)
[place]  [phone] [phone]  ID: [name] OB: April 13, 1965  MR#: [id number]  AGT#:[id number]  [name], MD as PCP - General (Family Medicine)  CHIEF COMPLAINT: Progressive multiple myeloma, pancytopenia, seizures, end-stage renal disease.  INTERVAL HISTORY: Patient is a 51 year old female with progressive multiple myeloma who was recently admitted to hospital with new onset seizures possibly secondary to hypertensive emergency. She developed neutropenic fevers, without definitive source. She has progressive pancytopenia requiring blood transfusions. She also gets dialysis 3 times a week for end-stage renal disease. Patient is lethargic but arousable. Review of systems is difficult. She offers no specific complaints.  Visit was done in the presence of an interpreter.  REVIEW OF SYSTEMS:   Review of Systems  Unable to perform ROS: Medical condition    PAST MEDICAL HISTORY: Past Medical History:  Diagnosis Date  . Anemia   . Anxiety   . Chronic kidney disease    DIALYSIS  . Depression   . Diabetes mellitus without complication ([place])   . Dialysis patient ([place])    Mon, Wed, Fri  . History of chemotherapy    chemo on Tuesday and Wednesday  . Hypertension   . Multiple myeloma ([place])   . Neuropathy associated with cancer ([place])   . Osteoarthritis   . Pancreatitis   . Pneumonia    September 2017, [facility]  . Post-menopausal 2014  . Sepsis ([place])     PAST SURGICAL HISTORY: Past Surgical History:  Procedure Laterality Date  . A/V SHUNT INTERVENTION Left 07/07/2016   Procedure: A/V Shunt Intervention;  Surgeon: Algernon Huxley, MD;  Location: [place] [facility];  Service: Cardiovascular;  Laterality: Left;  . AV FISTULA PLACEMENT Left 02/20/2016   Procedure: INSERTION OF ARTERIOVENOUS (AV) GORE-TEX GRAFT ARM;  Surgeon: Katha Cabal, MD;  Location: [facility];  Service: Vascular;  Laterality:  Left;  . CESAREAN SECTION     x2  . dialysis catheter placement    . PERIPHERAL VASCULAR CATHETERIZATION N/A 11/19/2015   Procedure: Dialysis/Perma Catheter Insertion;  Surgeon: Algernon Huxley, MD;  Location: [place] [facility];  Service: Cardiovascular;  Laterality: N/A;  . PERIPHERAL VASCULAR CATHETERIZATION Left 03/10/2016   Procedure: Thrombectomy;  Surgeon: Katha Cabal, MD;  Location: [place] [facility];  Service: Cardiovascular;  Laterality: Left;  . PERIPHERAL VASCULAR CATHETERIZATION N/A 03/31/2016   Procedure: Dialysis/Perma Catheter Removal;  Surgeon: Katha Cabal, MD;  Location: [place] [facility];  Service: Cardiovascular;  Laterality: N/A;  . PORTACATH PLACEMENT    . TUBAL LIGATION      FAMILY HISTORY: Family History  Problem Relation Age of Onset  . Hypercholesterolemia Mother   . Hypertension Mother   . Hypertension Father     ADVANCED DIRECTIVES (Y/N):  @ADVDIR @  HEALTH MAINTENANCE: Social History  Substance Use Topics  . Smoking status: Never Smoker  . Smokeless tobacco: Never Used  . Alcohol use No     Colonoscopy:  PAP:  Bone density:  Lipid panel:  No Known Allergies  Current Facility-Administered Medications  Medication Dose Route Frequency Provider Last Rate Last Dose  . 0.9 %  sodium chloride infusion   Intravenous Once Gouru, Aruna, MD      . acetaminophen (TYLENOL) tablet 650 mg  650 mg Oral Q6H PRN Saundra Shelling, MD   650 mg at 11/14/16 0943   Or  . acetaminophen (TYLENOL) suppository 650 mg  650 mg Rectal Q6H PRN Saundra Shelling, MD  650 mg at 11/13/2016 1641  . albumin human 25 % solution 12.5 g  12.5 g Intravenous Once Kolluru, Sarath, MD      . albuterol (PROVENTIL) (2.5 MG/3ML) 0.083% nebulizer solution 2.5 mg  2.5 mg Nebulization Q4H PRN Kolluru, Sarath, MD   2.5 mg at 11/12/16 2304  . aspirin EC tablet 81 mg  81 mg Oral Daily Saundra Shelling, MD   81 mg at 11/16/16 0804  . cinacalcet (SENSIPAR) tablet 60 mg  60 mg Oral Q supper  Kolluru, Sarath, MD   60 mg at 11/16/16 1614  . cloNIDine (CATAPRES) tablet 0.1 mg  0.1 mg Oral BID Gouru, Aruna, MD   0.1 mg at 11/16/16 0804  . epoetin alfa (EPOGEN,PROCRIT) injection 10,000 Units  10,000 Units Intravenous Q M,W,F-HD Lavonia Dana, MD   10,000 Units at 11/15/16 1732  . fentaNYL (DURAGESIC - dosed mcg/hr) patch 25 mcg  25 mcg Transdermal Q72H Saundra Shelling, MD   25 mcg at 11/14/16 0944  . hydrALAZINE (APRESOLINE) tablet 25 mg  25 mg Oral Q8H Gouru, Aruna, MD   25 mg at 11/16/16 0550  . insulin aspart (novoLOG) injection 0-15 Units  0-15 Units Subcutaneous TID WC & HS Mikael Spray, NP   2 Units at 11/16/16 1205  . irbesartan (AVAPRO) tablet 300 mg  300 mg Oral QHS Singh, Harmeet, MD      . ixazomib citrate (NINLARO) capsule 3 mg  3 mg Oral Weekly Harrie Foreman, MD   3 mg at 11/12/16 0901  . labetalol (NORMODYNE,TRANDATE) injection 10 mg  10 mg Intravenous Q2H PRN Lance Coon, MD   10 mg at 11/12/16 0909  . levETIRAcetam (KEPPRA) tablet 500 mg  500 mg Oral Daily Epifanio Lesches, MD   500 mg at 11/16/16 0805   And  . levETIRAcetam (KEPPRA) tablet 250 mg  250 mg Oral Once per day on Mon Wed Fri Epifanio Lesches, MD   250 mg at 11/12/16 1839  . LORazepam (ATIVAN) injection 2 mg  2 mg Intravenous Q4H PRN Saundra Shelling, MD   2 mg at 11/13/16 2251  . MEDLINE mouth rinse  15 mL Mouth Rinse BID Tukov, Magadalene S, NP   15 mL at 11/14/16 1000  . metoprolol tartrate (LOPRESSOR) tablet 25 mg  25 mg Oral BID Gouru, Aruna, MD   25 mg at 11/16/16 0805  . ondansetron (ZOFRAN) tablet 4 mg  4 mg Oral Q6H PRN Saundra Shelling, MD       Or  . ondansetron (ZOFRAN) injection 4 mg  4 mg Intravenous Q6H PRN Saundra Shelling, MD   4 mg at 11/15/16 0856  . ondansetron (ZOFRAN) tablet 4 mg  4 mg Oral Once Kolluru, Sarath, MD      . oxyCODONE (Oxy IR/ROXICODONE) immediate release tablet 10 mg  10 mg Oral Q4H PRN Epifanio Lesches, MD   10 mg at 11/16/16 0624  .  piperacillin-tazobactam (ZOSYN) IVPB 3.375 g  3.375 g Intravenous Q12H Coffee, Donna Christen, [facility]   Stopped at 11/16/16 1205  . prochlorperazine (COMPAZINE) tablet 10 mg  10 mg Oral Q6H PRN Saundra Shelling, MD   10 mg at 11/16/16 0805  . senna (SENOKOT) tablet 8.6 mg  1 tablet Oral Daily Pyreddy, Pavan, MD   8.6 mg at 11/16/16 0804  . sodium chloride flush (NS) 0.9 % injection 10-40 mL  10-40 mL Intracatheter Q12H Epifanio Lesches, MD   10 mL at 11/16/16 0805  . sodium chloride flush (NS) 0.9 % injection 10-40  mL  10-40 mL Intracatheter PRN Epifanio Lesches, MD      . vancomycin (VANCOCIN) 500 mg in sodium chloride 0.9 % 100 mL IVPB  500 mg Intravenous Q M,W,F-HD Coffee, Donna Christen, RPH 100 mL/hr at 11/15/16 1732 500 mg at 11/15/16 1732  . verapamil (CALAN-SR) CR tablet 120 mg  120 mg Oral Daily Gouru, Aruna, MD   120 mg at 11/16/16 0806   Facility-Administered Medications Ordered in Other Encounters  Medication Dose Route Frequency Provider Last Rate Last Dose  . LORazepam (ATIVAN) injection 1 mg  1 mg Intravenous Once Lloyd Huger, MD        OBJECTIVE: Vitals:   11/16/16 1611 11/16/16 1927  BP: (!) 94/39 (!) 144/45  Pulse: (!) 58 (!) 59  Resp: 16 17  Temp: 97.8 F (36.6 C) (!) 97.5 F (36.4 C)  SpO2: 100% 98%     Body mass index is 21.92 kg/m.    ECOG FS:4 - Bedbound  General: Ill-appearing, no acute distress. Eyes: Pink conjunctiva, anicteric sclera. Lungs: Clear to auscultation bilaterally. Heart: Regular rate and rhythm. No rubs, murmurs, or gallops. Abdomen: Soft, normoactive bowel sounds. Musculoskeletal: No edema, cyanosis, or clubbing. Neuro: Francee Piccolo, difficult to arouse. Cranial nerves grossly intact. Skin: No rashes or petechiae noted. Psych: Normal affect.   LAB RESULTS:  Lab Results  Component Value Date   NA 133 (L) 11/15/2016   K 4.5 11/15/2016   CL 93 (L) 11/15/2016   CO2 28 11/15/2016   GLUCOSE 202 (H) 11/15/2016   BUN 28 (H) 11/15/2016   CREATININE  3.93 (H) 11/15/2016   CALCIUM 11.3 (H) 11/15/2016   PROT 5.2 (L) 11/15/2016   ALBUMIN 2.6 (L) 11/15/2016   ALBUMIN 2.4 (L) 11/15/2016   AST 22 11/15/2016   ALT 11 (L) 11/15/2016   ALKPHOS 102 11/15/2016   BILITOT 0.6 11/15/2016   GFRNONAA 12 (L) 11/15/2016   GFRAA 14 (L) 11/15/2016    Lab Results  Component Value Date   WBC 2.7 (L) 11/16/2016   NEUTROABS 1.9 11/16/2016   HGB 7.7 (L) 11/16/2016   HCT 22.4 (L) 11/16/2016   MCV 87.7 11/16/2016   PLT 20 (LL) 11/16/2016     STUDIES: Dg Chest 2 View  Result Date: 11/14/2016 CLINICAL DATA:  Patient with shortness of breath. History of multiple myeloma. End-stage renal disease. EXAM: CHEST  2 VIEW COMPARISON:  Chest radiograph 11/11/2016 FINDINGS: Right anterior chest wall Port-A-Cath is present with tip projecting over the superior vena cava. Monitoring leads overlie the patient. Stable cardiomegaly. Right-greater-than-left diffuse interstitial pulmonary opacities. Moderate right and small left layering pleural effusions. No pneumothorax. IMPRESSION: Cardiomegaly with pulmonary edema and layering bilateral pleural effusions. Electronically Signed   By: Lovey Newcomer M.D.   On: 11/14/2016 10:49   Dg Knee 1-2 Views Left  Result Date: 11/15/2016 CLINICAL DATA:  Pain.  History of multiple myeloma EXAM: LEFT KNEE - 1-2 VIEW COMPARISON:  None. FINDINGS: Frontal lateral views were obtained it. There is no appreciable fracture or dislocation. No evident joint effusion. Joint spaces appear normal. There is slight spurring medially. No blastic or lytic bone lesions are evident. IMPRESSION: Mild spurring medially. No appreciable joint space narrowing or erosion. No fracture or joint effusion. Electronically Signed   By: Lowella Grip III M.D.   On: 11/15/2016 13:42   Dg Knee 1-2 Views Right  Result Date: 11/15/2016 CLINICAL DATA:  Pain.  History of multiple myeloma EXAM: RIGHT KNEE - 1-2 VIEW COMPARISON:  October 22, 2016 FINDINGS:  Frontal lateral  views were obtained. There is a moderate joint effusion. No fracture or dislocation. There is a lytic appearing lesion distal femoral diaphysis measuring 1.2 x 0.4 cm. There is subtle lucency in the proximal tibial diaphysis as well. There is periosteal reaction along the medial proximal tibia without apparent fracture in this area. There is subtle nearby lucency. IMPRESSION: 1.  Joint effusion. 2.  Areas of lucency, potentially representing multiple myeloma. 3.  No acute appearing fracture or dislocation. 4. Periosteal reaction along the medial proximal tibial metaphysis with nearby lucency. Question traumatic etiology versus potential reaction to neoplasm or subtle osteomyelitis. This finding may warrant MRI of the right knee to further evaluate the marrow in this region. Electronically Signed   By: Lowella Grip III M.D.   On: 11/15/2016 13:46   Dg Knee 1-2 Views Right  Result Date: 10/22/2016 CLINICAL DATA:  Right knee pain without known injury. EXAM: RIGHT KNEE - 1-2 VIEW COMPARISON:  None. FINDINGS: No evidence of fracture, dislocation, or joint effusion. Mild narrowing of medial joint space is noted. Soft tissues are unremarkable. IMPRESSION: Mild degenerative joint disease. No acute abnormality seen in the right knee. Electronically Signed   By: Marijo Conception, M.D.   On: 10/22/2016 10:27   Ct Head Wo Contrast  Result Date: 11/19/2016 CLINICAL DATA:  Altered level of consciousness.  Seizure. EXAM: CT HEAD WITHOUT CONTRAST TECHNIQUE: Contiguous axial images were obtained from the base of the skull through the vertex without intravenous contrast. COMPARISON:  Brain MRI 04/30/2016, head CT 12/08/2015 FINDINGS: Brain: Symmetric low-density within bilateral posterior parietooccipital lobes. No evidence of acute hemorrhage. No hydrocephalus, midline shift or mass effect. No subdural or extra-axial fluid collection. Vascular:  No hyperdense vessel. Skull: Suggestion of diffuse sclerosis. Subcentimeter  lytic lesion in the high right frontoparietal calvarium is new from prior exam. Subcentimeter lytic lesion in the left frontal bone is also new. Unchanged lytic skullbase lesion. Smaller subcentimeter lesions in the left frontal bone are unchanged from previous. Sinuses/Orbits: Paranasal sinuses and mastoid air cells are clear. The visualized orbits are unremarkable. Other: None. IMPRESSION: 1. Symmetric low-density within posterior parietooccipital lobes suspicious for PRES (posterior reversible encephalopathy syndrome). Consider MRI for further evaluation. 2. Subcentimeter calvarial lesions in right and left frontal bone there are new from October 2017 head CT likely related to myeloma. Electronically Signed   By: Jeb Levering M.D.   On: 11/29/2016 02:25   Mr Brain Wo Contrast  Result Date: 11/09/2016 CLINICAL DATA:  51 y/o  F; seizure episodes with hypertension. EXAM: MRI HEAD WITHOUT CONTRAST TECHNIQUE: Multiplanar, multiecho pulse sequences of the brain and surrounding structures were obtained without intravenous contrast. COMPARISON:  11/07/2016 CT head.  04/30/2016 MRI head. FINDINGS: Brain: T2 FLAIR hyperintense signal abnormality involving cortex and subcortical white matter with a predominantly posterior distribution in the supratentorial brain, typical of PRES. No reduced diffusion to suggest acute or early subacute infarction. No abnormal susceptibility hypointensity to indicate intracranial hemorrhage. There is cortical swelling with mild local mass effect associated with the areas of T2 FLAIR signal abnormality. No effacement of basilar cisterns, herniation, or extra-axial collection. Mild diffuse brain parenchymal volume loss. Vascular: Normal flow voids. 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 Sinuses/Orbits: Negative. Other: None. IMPRESSION: 1. Findings typical of acute hypertensive encephalopathy, PRES,  involving the posterior supratentorial brain. 2. No evidence for acute infarction, hemorrhage, or significant mass effect. 3. Mild brain parenchymal volume loss.  Electronically Signed   By: Kristine Garbe M.D.   On: 11/09/2016 03:09   Dg Chest Port 1 View  Result Date: 11/11/2016 CLINICAL DATA:  Shortness of breath. EXAM: PORTABLE CHEST 1 VIEW COMPARISON:  November 08, 2016 FINDINGS: The distal tip of the right Port-A-Cath terminates near the caval atrial junction. Increasing bilateral pulmonary opacities and probable effusions, left greater than right are identified. No pneumothorax. No other changes. IMPRESSION: Suspected developing effusions and edema. An infectious etiology is considered less likely. Electronically Signed   By: Dorise Bullion III M.D   On: 11/11/2016 16:18   Dg Chest Port 1 View  Result Date: 11/16/2016 CLINICAL DATA:  Cough.  Seizure-like activity. EXAM: PORTABLE CHEST 1 VIEW COMPARISON:  Chest radiograph 05/26/2016. FINDINGS: Tip of the right chest port in the distal SVC. Heart size upper normal. Mediastinal contours are normal. Improved bilateral pleural effusions from prior exam, suspect small volume residual or recurrent pleural fluid. Improved pulmonary edema from prior exam. Bony sclerosis with remote bilateral rib fractures, likely related to myeloma. Lytic lesion in the right clavicle. IMPRESSION: 1. Small pleural effusions, improved from prior exam, may be persistent or recurrent. Improved pulmonary edema, mild residual or recurrent vascular congestion. 2. Bony scleroses and bilateral rib fractures likely related to myeloma. Electronically Signed   By: Jeb Levering M.D.   On: 11/27/2016 02:36    ASSESSMENT: Progressive multiple myeloma, pancytopenia, seizures, end-stage renal disease.  PLAN:    1. Progressive multiple myeloma: Patient recently started on Nin[place]o. This has now been discontinued. Given her significantly decreased performance status, severe  pancytopenia, and seizures no further treatments have been planned. Discussed with patient that she does not have any further treatment options and she should consider comfort measures. No family was at bedside and patient that did not respond during conversation. Palliative care has been consulted and recommended family meeting to further discuss end-of-life care. 2. Pancytopenia: Likely multifactorial including progressive multiple myeloma and possible sepsis. She received one unit of packed red blood cells today. Monitor daily CBC. 3. Seizures: Possibly secondary to hypertensive emergency. Patient on Keppra with no further seizure activity reported. 4. End-stage renal disease: Continue dialysis as per nephrology. 5. Neutropenic fevers: No obvious source of infection. Appreciate ID input.  I will be out of the hospital on Wednesday, November 17, 2016. Please call the on-call physician if there are any questions or concerns.  Entire visit was done the presence of an interpreter.   Lloyd Huger, MD   11/16/2016 9:28 PM

## 2016-11-16 NOTE — Progress Notes (Signed)
Patient assessed and educated using interpreter [phone]. All questions answered, no other concerns at this time. Will continue to monitor.  Jamie Keller M \

## 2016-11-16 NOTE — Progress Notes (Signed)
Pt administered one unit of PRBC and unit verified with Dewaine Oats, RN, after unit verified and started, unit of blood completed in error and was not able to edit to chart on unit of blood. Made Dewaine Oats and Deborah Chalk, RNs aware and instructed to chart a progress note regarding administration of blood product. pts blood started at 1240  At a rate of 86ml/hr. Stayed at pts bedside for first 15 minutes and no signs/symptoms of reaction noted.  Pre transfusion vitals documented at 1235. 15 minutes post blood start vitals taken at 1255 and rate increased to 185ml/hr. Blood completed at 1611 and completion vitals taken at this time. Pt still shows no s/s of reaction. A total of 365ml of blood administered.  Loma Sousa, RN

## 2016-11-17 ENCOUNTER — Encounter: Admission: EM | Disposition: E | Payer: Self-pay | Source: Home / Self Care | Attending: Internal Medicine

## 2016-11-17 DIAGNOSIS — I10 Essential (primary) hypertension: Secondary | ICD-10-CM

## 2016-11-17 DIAGNOSIS — Z515 Encounter for palliative care: Secondary | ICD-10-CM

## 2016-11-17 DIAGNOSIS — C9002 Multiple myeloma in relapse: Secondary | ICD-10-CM

## 2016-11-17 DIAGNOSIS — T82868A Thrombosis of vascular prosthetic devices, implants and grafts, initial encounter: Secondary | ICD-10-CM

## 2016-11-17 DIAGNOSIS — R0602 Shortness of breath: Secondary | ICD-10-CM

## 2016-11-17 DIAGNOSIS — Z7189 Other specified counseling: Secondary | ICD-10-CM

## 2016-11-17 HISTORY — PX: A/V SHUNT INTERVENTION: CATH118220

## 2016-11-17 LAB — CBC
HCT: 24 % — ABNORMAL LOW (ref 35.0–47.0)
HEMOGLOBIN: 8.3 g/dL — AB (ref 12.0–16.0)
MCH: 30.9 pg (ref 26.0–34.0)
MCHC: 34.8 g/dL (ref 32.0–36.0)
MCV: 88.8 fL (ref 80.0–100.0)
PLATELETS: 20 10*3/uL — AB (ref 150–440)
RBC: 2.7 MIL/uL — AB (ref 3.80–5.20)
RDW: 15.1 % — ABNORMAL HIGH (ref 11.5–14.5)
WBC: 3.9 10*3/uL (ref 3.6–11.0)

## 2016-11-17 LAB — BASIC METABOLIC PANEL
ANION GAP: 13 (ref 5–15)
BUN: 30 mg/dL — ABNORMAL HIGH (ref 6–20)
CALCIUM: 12.1 mg/dL — AB (ref 8.9–10.3)
CO2: 28 mmol/L (ref 22–32)
Chloride: 90 mmol/L — ABNORMAL LOW (ref 101–111)
Creatinine, Ser: 3.83 mg/dL — ABNORMAL HIGH (ref 0.44–1.00)
GFR, EST AFRICAN AMERICAN: 15 mL/min — AB (ref >60)
GFR, EST NON AFRICAN AMERICAN: 13 mL/min — AB (ref >60)
GLUCOSE: 111 mg/dL — AB (ref 65–99)
Potassium: 4.5 mmol/L (ref 3.5–5.1)
SODIUM: 131 mmol/L — AB (ref 135–145)

## 2016-11-17 LAB — GLUCOSE, CAPILLARY
GLUCOSE-CAPILLARY: 106 mg/dL — AB (ref 65–99)
GLUCOSE-CAPILLARY: 107 mg/dL — AB (ref 65–99)
Glucose-Capillary: 119 mg/dL — ABNORMAL HIGH (ref 65–99)
Glucose-Capillary: 99 mg/dL (ref 65–99)

## 2016-11-17 LAB — VANCOMYCIN, RANDOM: VANCOMYCIN RM: 12

## 2016-11-17 SURGERY — A/V SHUNT INTERVENTION
Anesthesia: Moderate Sedation

## 2016-11-17 MED ORDER — HEPARIN SOD (PORK) LOCK FLUSH 100 UNIT/ML IV SOLN
500.0000 [IU] | Freq: Once | INTRAVENOUS | Status: AC
  Administered 2016-11-17: 500 [IU] via INTRAVENOUS

## 2016-11-17 MED ORDER — MUSCLE RUB 10-15 % EX CREA
TOPICAL_CREAM | Freq: Two times a day (BID) | CUTANEOUS | Status: DC
  Administered 2016-11-18 – 2016-11-20 (×6): via TOPICAL
  Filled 2016-11-17: qty 85

## 2016-11-17 MED ORDER — HEPARIN (PORCINE) IN NACL 2-0.9 UNIT/ML-% IJ SOLN
INTRAMUSCULAR | Status: AC
  Filled 2016-11-17: qty 1000

## 2016-11-17 MED ORDER — VANCOMYCIN HCL IN DEXTROSE 750-5 MG/150ML-% IV SOLN
750.0000 mg | INTRAVENOUS | Status: DC
  Filled 2016-11-17: qty 150

## 2016-11-17 MED ORDER — MIDAZOLAM HCL 2 MG/2ML IJ SOLN
INTRAMUSCULAR | Status: DC | PRN
  Administered 2016-11-17: 1 mg via INTRAVENOUS

## 2016-11-17 MED ORDER — ONDANSETRON HCL 4 MG/2ML IJ SOLN
4.0000 mg | Freq: Three times a day (TID) | INTRAMUSCULAR | Status: DC
  Administered 2016-11-18 – 2016-11-21 (×10): 4 mg via INTRAVENOUS
  Filled 2016-11-17 (×9): qty 2

## 2016-11-17 MED ORDER — HEPARIN SOD (PORK) LOCK FLUSH 100 UNIT/ML IV SOLN
INTRAVENOUS | Status: AC
  Administered 2016-11-17: 500 [IU] via INTRAVENOUS
  Filled 2016-11-17: qty 5

## 2016-11-17 MED ORDER — SODIUM CHLORIDE 0.9 % IV SOLN
500.0000 mg | INTRAVENOUS | Status: DC
  Administered 2016-11-18: 500 mg via INTRAVENOUS
  Filled 2016-11-17 (×2): qty 500

## 2016-11-17 MED ORDER — DEXTROSE 5 % IV SOLN
1.5000 g | INTRAVENOUS | Status: AC
  Administered 2016-11-17: 16:00:00 via INTRAVENOUS

## 2016-11-17 MED ORDER — SODIUM CHLORIDE 0.9 % IV SOLN
INTRAVENOUS | Status: DC
  Administered 2016-11-17: 15:00:00 via INTRAVENOUS

## 2016-11-17 MED ORDER — IOPAMIDOL (ISOVUE-300) INJECTION 61%
INTRAVENOUS | Status: DC | PRN
  Administered 2016-11-17: 25 mL via INTRA_ARTERIAL

## 2016-11-17 MED ORDER — FENTANYL CITRATE (PF) 100 MCG/2ML IJ SOLN
INTRAMUSCULAR | Status: AC
  Filled 2016-11-17: qty 2

## 2016-11-17 MED ORDER — MIDAZOLAM HCL 5 MG/5ML IJ SOLN
INTRAMUSCULAR | Status: AC
  Filled 2016-11-17: qty 5

## 2016-11-17 MED ORDER — FENTANYL CITRATE (PF) 100 MCG/2ML IJ SOLN
INTRAMUSCULAR | Status: DC | PRN
  Administered 2016-11-17: 25 ug via INTRAVENOUS

## 2016-11-17 MED ORDER — HEPARIN SODIUM (PORCINE) 1000 UNIT/ML IJ SOLN
INTRAMUSCULAR | Status: AC
  Filled 2016-11-17: qty 1

## 2016-11-17 MED ORDER — LIDOCAINE-EPINEPHRINE (PF) 2 %-1:200000 IJ SOLN
INTRAMUSCULAR | Status: AC
  Filled 2016-11-17: qty 20

## 2016-11-17 SURGICAL SUPPLY — 15 items
BALLN LUTONIX AV 8X60X75 (BALLOONS) ×3
BALLOON LUTONIX AV 8X60X75 (BALLOONS) ×1 IMPLANT
CANNULA 5F STIFF (CANNULA) ×3 IMPLANT
CATH BEACON 5 .035 40 KMP TP (CATHETERS) ×1 IMPLANT
CATH BEACON 5 .038 40 KMP TP (CATHETERS) ×2
DEVICE PRESTO INFLATION (MISCELLANEOUS) ×3 IMPLANT
DRAPE BRACHIAL (DRAPES) ×3 IMPLANT
PACK ANGIOGRAPHY (CUSTOM PROCEDURE TRAY) ×3 IMPLANT
SHEATH BRITE TIP 6FRX5.5 (SHEATH) ×3 IMPLANT
SHEATH BRITE TIP 7FRX5.5 (SHEATH) ×3 IMPLANT
STENT VIABAHN 8X100X120 (Permanent Stent) ×2 IMPLANT
STENT VIABAHN 8X10X120 (Permanent Stent) ×1 IMPLANT
SUT MNCRL AB 4-0 PS2 18 (SUTURE) ×3 IMPLANT
WIRE G V18X300CM (WIRE) ×3 IMPLANT
WIRE MAGIC TOR.035 180C (WIRE) ×3 IMPLANT

## 2016-11-17 NOTE — Progress Notes (Signed)
[facility]  ROUNDING NOTE   Subjective:   Pt was seen on the hemodialysis unit. Agrees that she has been eating well. Problem with AV access today, venous access clotted. Could not do hemodialysis today.   Hemoglobin improving to 8.3 today Platelets continue to be low Calcium increased at 12.1   Objective:  Vital signs in last 24 hours:  Temp:  [97.5 F (36.4 C)-98.6 F (37 C)] 98.6 F (37 C) (09/12 1200) Pulse Rate:  [58-76] 73 (09/12 1200) Resp:  [13-18] 16 (09/12 1200) BP: (94-177)/(39-62) 154/53 (09/12 1200) SpO2:  [97 %-100 %] 97 % (09/12 1200)  Weight change:  Filed Weights   11/10/16 0957 11/10/16 1258 11/11/16 1712  Weight: 50.9 kg (112 lb 3.4 oz) 50.9 kg (112 lb 3.4 oz) 50.9 kg (112 lb 3.4 oz)    Intake/Output: I/O last 3 completed shifts: In: 360 [P.O.:240; Blood:70; IV Piggyback:50] Out: 0    Intake/Output this shift:  Total I/O In: 70 [P.O.:60; I.V.:10] Out: -   Physical Exam: General: Somewhat improved, laying in bed  Head:  Moist oral mucosal membranes  Eyes: Anicteric  Neck: Right IJ port  Lungs:  Clear to auscultation bilaterally, normal respiratory effort  Heart: S1S2 no rubs  Abdomen:  Soft, nontender  Extremities: No peripheral edema.  Neurologic: Alert  Skin: No lesions  Access: LUE AVF, venous clotted when attempting hemodialysis    Basic Metabolic Panel:  Recent Labs Lab 11/12/16 0429 11/14/16 0538 11/15/16 0435 11/15/16 1149 11/12/2016 0454  NA 138 134* 138 133* 131*  K 3.6 4.5 4.6 4.5 4.5  CL 95* 93* 95* 93* 90*  CO2 28 28 30 28 28   GLUCOSE 124* 99 100* 202* 111*  BUN 24* 40* 23* 28* 30*  CREATININE 2.90* 4.35* 3.15* 3.93* 3.83*  CALCIUM 11.1* 11.4* 11.3* 11.3* 12.1*  PHOS  --   --   --  5.8*  --     Liver Function Tests:  Recent Labs Lab 11/15/16 1149  AST 22  ALT 11*  ALKPHOS 102  BILITOT 0.6  PROT 5.2*  ALBUMIN 2.4*  2.6*   No results for input(s): LIPASE, AMYLASE in the last 168 hours. No  results for input(s): AMMONIA in the last 168 hours.  CBC:  Recent Labs Lab 11/12/16 0429 11/15/16 0435 11/15/16 1149 11/16/16 0600 11/16/16 1824 11/14/2016 0454  WBC 5.3 2.9* 2.2* 2.7*  --  3.9  NEUTROABS  --  2.4  --  1.9  --   --   HGB 8.6* 7.1* 6.2* 5.9* 7.7* 8.3*  HCT 25.1* 21.1* 17.9* 17.4* 22.4* 24.0*  MCV 89.3 88.9 89.7 87.7  --  88.8  PLT 80* 33* 29* 20*  --  20*    Cardiac Enzymes: No results for input(s): CKTOTAL, CKMB, CKMBINDEX, TROPONINI in the last 168 hours.  BNP: Invalid input(s): POCBNP  CBG:  Recent Labs Lab 11/16/16 0748 11/16/16 1131 11/16/16 1640 11/16/16 2036 11/20/2016 0709  GLUCAP 148* 129* 131* 105* 106*    Microbiology: Results for orders placed or performed during the hospital encounter of 11/07/2016  MRSA PCR Screening     Status: None   Collection Time: 11/24/2016  3:41 AM  Result Value Ref Range Status   MRSA by PCR NEGATIVE NEGATIVE Final    Comment:        The GeneXpert MRSA Assay (FDA approved for NASAL specimens only), is one component of a comprehensive MRSA colonization surveillance program. It is not intended to diagnose MRSA infection nor  to guide or monitor treatment for MRSA infections.   Urine Culture     Status: Abnormal   Collection Time: 11/10/16  2:19 AM  Result Value Ref Range Status   Specimen Description URINE, RANDOM  Final   Special Requests NONE  Final   Culture MULTIPLE SPECIES PRESENT, SUGGEST RECOLLECTION (A)  Final   Report Status 11/11/2016 FINAL  Final  CULTURE, BLOOD (ROUTINE X 2) w Reflex to ID Panel     Status: None (Preliminary result)   Collection Time: 11/14/16  8:26 AM  Result Value Ref Range Status   Specimen Description BLOOD RIGHT ANTECUBITAL  Final   Special Requests   Final    BOTTLES DRAWN AEROBIC AND ANAEROBIC Blood Culture adequate volume   Culture NO GROWTH 3 DAYS  Final   Report Status PENDING  Incomplete  CULTURE, BLOOD (ROUTINE X 2) w Reflex to ID Panel     Status: None  (Preliminary result)   Collection Time: 11/14/16  8:38 AM  Result Value Ref Range Status   Specimen Description BLOOD BLOOD RIGHT HAND  Final   Special Requests   Final    BOTTLES DRAWN AEROBIC AND ANAEROBIC Blood Culture adequate volume   Culture NO GROWTH 3 DAYS  Final   Report Status PENDING  Incomplete    Coagulation Studies: No results for input(s): LABPROT, INR in the last 72 hours.  Urinalysis: No results for input(s): COLORURINE, LABSPEC, PHURINE, GLUCOSEU, HGBUR, BILIRUBINUR, KETONESUR, PROTEINUR, UROBILINOGEN, NITRITE, LEUKOCYTESUR in the last 72 hours.  Invalid input(s): APPERANCEUR    Imaging: No results found.   Medications:   . sodium chloride    . albumin human    . piperacillin-tazobactam (ZOSYN)  IV Stopped (12/05/2016 0218)  . [START ON 11/19/2016] vancomycin    . vancomycin     . aspirin EC  81 mg Oral Daily  . cinacalcet  60 mg Oral Q supper  . cloNIDine  0.1 mg Oral BID  . epoetin (EPOGEN/PROCRIT) injection  10,000 Units Intravenous Q M,W,F-HD  . fentaNYL  25 mcg Transdermal Q72H  . hydrALAZINE  25 mg Oral Q8H  . insulin aspart  0-15 Units Subcutaneous TID WC & HS  . irbesartan  300 mg Oral QHS  . levETIRAcetam  500 mg Oral Daily   And  . levETIRAcetam  250 mg Oral Once per day on Mon Wed Fri  . mouth rinse  15 mL Mouth Rinse BID  . metoprolol tartrate  25 mg Oral BID  . ondansetron (ZOFRAN) IV  4 mg Intravenous TID AC  . ondansetron  4 mg Oral Once  . senna  1 tablet Oral Daily  . sodium chloride flush  10-40 mL Intracatheter Q12H  . verapamil  120 mg Oral Daily   acetaminophen **OR** acetaminophen, albuterol, labetalol, LORazepam, ondansetron **OR** ondansetron (ZOFRAN) IV, oxyCODONE, prochlorperazine, sodium chloride flush  Assessment/ Plan:  51 y.o.Hispanic Spanish Speaking only female with diabetes mellitus type II, hypertension, multiple myeloma, ESRD on hemodialysis.   [facility] MWF [address] AVF  1. End Stage Renal Disease: ESRD  secondary to multiple myeloma.  -Venous clot during hemodialysis today, attempted to cannulize two times without success. Sent to Dr. Lucky Cowboy for declot today.  - Schedule hemodialysis tomorrow  2. Anemia of chronic kidney disease with multiple myeloma:  - Hemoglobin improved to 8.3 with blood transfusion yesterday, continues to have low platelets - EPO with HD treatment tomorrow  3. 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  - Home regimen of verapamil.  - Started irbesartan on this admission.   4. Secondary Hyperparathyroidism:  Calcium elevated at 11.9 on admission. -Calcium continues to be elevated,12.1 today -Secondary to her underlying multiple myeloma. - Phosphorus elevated 9/10 at 5.8, will start binders when pt is able to eat a normal diet  5. Fever  - Receiving Zosyn and vancomycin.  - Fever workup in progress.   LOS: 9 Jamie Keller 9/12/20183:07 PM

## 2016-11-17 NOTE — Consult Note (Signed)
Pharmacy Antibiotic Note  Jamie Keller is a 51 y.o. female admitted on 11/13/2016 with neutropenic fevers.  Pharmacy has been consulted for vancomycin/zosyn dosing. Patient is an ESRD pt on HD MWF  Plan: Patient received an extra HD session on Sunday, but did not received vancomycin dose after session. Pre-HD level this AM is 12 (goal 15-25). I will give 750mg  vancomycin at the end on HD today and then resume 500mg  q HD thereafter. Will check another vanc level Monday prior to HD. Zosyn 3.375g q 12 hr  Height: 5' (152.4 cm) Weight: 112 lb 3.4 oz (50.9 kg) IBW/kg (Calculated) : 45.5  Temp (24hrs), Avg:97.9 F (36.6 C), Min:97.5 F (36.4 C), Max:98.8 F (37.1 C)   Recent Labs Lab 11/12/16 0429 11/14/16 0538 11/15/16 0435 11/15/16 1149 11/16/16 0600 11/13/2016 0454  WBC 5.3  --  2.9* 2.2* 2.7* 3.9  CREATININE 2.90* 4.35* 3.15* 3.93*  --  3.83*  VANCORANDOM  --   --   --   --   --  12    Estimated Creatinine Clearance: 12.3 mL/min (A) (by C-G formula based on SCr of 3.83 mg/dL (H)).    No Known Allergies  Antimicrobials this admission: 9/9 vancomycin >>  9/9 zosyn >>   Dose adjustments this admission:   Microbiology results: 9/9 BCx: NG 3 days 9/9 UCx: mult species  9/3 MRSA PCR: neg  Thank you for allowing pharmacy to be a part of this patient's care.  Ramond Dial, Pharm.D, BCPS Clinical Pharmacist  11/07/2016 7:45 AM

## 2016-11-17 NOTE — Progress Notes (Signed)
[place], [place].   11/16/2016  Patient: Jamie Keller   Date of Birth:  1965-05-04  Date of admission:  11/15/2016  Date of Discharge  not decided yet   To Whom it May Concern:   [name]  Is admitted in hospital and have terminal medical condition, and outcome may not be good.  If you have any questions or concerns, please don't hesitate to call.  Sincerely,   Vaughan Basta M.D Pager [phone] Office : [phone]   .

## 2016-11-17 NOTE — Clinical Social Work Note (Signed)
CSW received referral for SNF.  Case discussed with case manager and plan is to discharge home with home health.  CSW to sign off please re-consult if social work needs arise.  Eamon Tantillo R. Solangel Mcmanaway, MSW, LCSWA [phone]  

## 2016-11-17 NOTE — Op Note (Signed)
[facility]    OPERATIVE NOTE   PROCEDURE: 1.  Left brachial artery to axillary vein arteriovenous graft cannulation under ultrasound guidance 2.  Left arm shuntogram 3.  Percutaneous transluminal angioplasty of the venous anastomosis and axillary vein with 59m diameter by 6 cm length Lutonix drug-coated angioplasty balloon 4.  Viabahn stent placement to the axillary vein and venous anastomosis with 8 mm diameter by 10 cm length covered stent for extravasation residual stenosis after angioplasty  PRE-OPERATIVE DIAGNOSIS: 1. ESRD 2. Malfunctioning left brachial artery to axillary vein arteriovenous graft  POST-OPERATIVE DIAGNOSIS: same as above   SURGEON: JLeotis Pain MD  ANESTHESIA: local with MCS  ESTIMATED BLOOD LOSS: 20 cc  FINDING(S): 1. Hyperplastic stenosis about 3 cm beyond the previously placed stent at the venous anastomosis of greater than 80%. The remainder of the AV graft was patent except some mild stenosis at the distal edge of the previous stent of less than 40%. The central venous circulation was patent.  SPECIMEN(S):  None   CONTRAST: 25 cc  FLUORO TIME: 1.6 minutes  MODERATE CONSCIOUS SEDATION TIME:  Approximately 20 minutes using 1 mg of Versed and 25 mcg of Fentanyl  INDICATIONS: Jamie Keller a 51y.o. female who presents with malfunctioning left brachial artery to axillary vein arteriovenous graft. This was unable to run on her dialysis treatment today. The patient is scheduled for left arm shuntogram.  The patient is aware the risks include but are not limited to: bleeding, infection, thrombosis of the cannulated access, and possible anaphylactic reaction to the contrast.  The patient is aware of the risks of the procedure and elects to proceed forward.  DESCRIPTION: After full informed written consent was obtained, the patient was brought back to the angiography suite and placed supine upon the angiography table.  The  patient was connected to monitoring equipment. Moderate conscious sedation was administered during a face to face encounter throughout the procedure with my supervision of the RN administering medicines and monitoring the patient's vital signs, pulse oximetry, telemetry and mental status throughout from the start of the procedure until the patient was taken to the recovery room The left arm was prepped and draped in the standard fashion for a percutaneous access intervention.  Under ultrasound guidance, the left brachial artery to axillary vein arteriovenous graft was cannulated with a micropuncture needle under direct ultrasound guidance and a permanent image was performed.  The microwire was advanced into the graft and the needle was exchanged for the a microsheath.  I then upsized to a 6 Fr Sheath and imaging was performed.  Hand injections were completed to image the access including the central venous system. This demonstrated hyperplastic stenosis about 3 cm beyond the previously placed stent at the venous anastomosis of greater than 80%. The remainder of the AV graft was patent except some mild stenosis at the distal edge of the previous stent of less than 40%. The central venous circulation was patent.  Based on the images, this patient will need intervention to this axillary vein stenosis. I elected not to give heparin given her marked thrombocytopenia.  Based on the imaging 8 mm x  6  cm  Lutonix drug coated angioplasty balloon was selected.  The balloon was centered around the venous anastomosis/axillary vein stenosis and inflated to 14 ATM for 1 minute(s).  On completion imaging, mild residual stenosis was present but extravasation was seen.  I elected to cover this area with a Viabahn stent. We upsized  to a 7 [place] sheath and exchanged for a 0.018 wire. An 8 mm diameter by 10 cm length covered stent was selected to encompass the lesion and good back into the previously placed stent across the venous  anastomosis. This was deployed encompassing the lesion and the area of extravasation and then postdilated with an 8 mm balloon with excellent angiographic completion result and less than 10% residual stenosis.     Based on the completion imaging, no further intervention is necessary.  The wire and balloon were removed from the sheath.  A 4-0 Monocryl purse-string suture was sewn around the sheath.  The sheath was removed while tying down the suture.  A sterile bandage was applied to the puncture site.  COMPLICATIONS: None  CONDITION: Stable   Leotis Pain  12/04/2016 4:13 PM    This note was created with Dragon Medical transcription system. Any errors in dictation are purely unintentional.

## 2016-11-17 NOTE — Consult Note (Signed)
Consultation Note Date: 11/25/2016   Patient Name: Jamie Keller  DOB: November 19, 1965  MRN: [id number]  Age / Sex: 51 y.o., female  PCP: Revelo, [name], MD Referring Physician: Vaughan Basta, *  Reason for Consultation: Establishing goals of care  HPI/Patient Profile: 51 y.o. female  with past medical history of ESRD, multiple myeloma, diabetes mellitus, CHF,  who was admitted on 12/02/2016 with seizures and neutropenic fever.  She was having hypertensive crisis and developed PRES syndrome.  She has been battling multiple myeloma for approximately 4 years.  At this point she is so week that oncology does not advise further chemotherapy or radiation, rather they recommend a palliative approach.  Clinical Assessment and Goals of Care:  Our goals of care meeting was made possible with the help of Kennyth Lose the [place] interpreter.  I have reviewed medical records including EPIC notes, labs and imaging, received report from Dr. Anselm Jungling, assessed the patient and then met at the bedside along with her husband, [place], two daughters, and son to discuss diagnosis prognosis, GOC, EOL wishes, disposition and options.  I introduced Palliative Medicine as specialized medical care for people living with serious illness. It focuses on providing relief from the symptoms and stress of a serious illness. The goal is to improve quality of life for both the patient and the family.  As far as functional and nutritional status she does not feel like eating much at all.  She tells me she feels like vomiting after she eats. She is unable to walk due to pain in her knees and the feeling of pins and needles in her feet.  Initially the family gave mixed signals about how much information they wanted to share with the patient.  Her husband did not want her to lose hope.  Her eldest daughter felt strongly that the  patient should know the truth about her condition and that she should participate in planning.  I attempted to elicit values and goals of care important to the patient.  She was clear that the most important thing in the world to her was spending time with family and being at home.    We had fairly open conversation about her limited life expectancy, on going hemodialysis and hospice.  We tried to address code status, but the interpreter felt that perhaps the patient did not understand exactly what we were asking.  The patient now understands that she is too weak for further chemotherapy.   She does want to continue hemodialysis as long as possible.  She understands that when she is too weak for hemodialysis she will have a life expectancy of less than 10 days.  We discuss hospice services at home (once dialysis is stopped) vs hospice house.   The patient asked how soon she can be discharged.  She wants to have hemodialysis and be at home.  Her daughter and husband plan to care for her at home.   They feel that when she accepts Hospice she will likely start with services at home and  then potentially progress to [facility].  The patient was exhausted but we attempted to discuss code status.  The patient stated she would want to fight.  I explained that the resuscitation may be traumatic to her body, and the patient asked how?  I mentioned breaking of ribs and the potential for internal bleeding.  I asked for guidance - If she were placed on life support how would we know when to discontinue it?  Would she want things like artificial feeding?  I don't believe the patient was able to understand what I was asking.  We discussed symptom management - nausea after eating, pain in knees, neuropathy in feet.      Primary Decision Maker:  PATIENT, husband would be surrogate decision maker however it was clear that the husband and family respect the oldest  dtr Marylyn.    SUMMARY OF RECOMMENDATIONS     Continue full code status.  I will attempt to re-address this tomorrow.  Continue hemodialysis as long as she is able, and it is being offered  When hemodialysis is no longer being offered re-address hospice services in the home vs hospice house  Will order zofran AC in an attempt to prevent post prandial nausea.  Family asked about CBD oil for knees and feet - I support their use of CBD oil outside of the hospital  Will request ben gay to be applied to knees.  Will request chaplain visit to lift spirits and provide comfort.  Family asked about assistance with a ramp for their home.    Code Status/Advance Care Planning:  full  Psycho-social/Spiritual:   Desire for further Chaplaincy support: yes  Prognosis:  Weeks given ESRD and advanced multiple myeloma with no further chemo or radiation options.    Discharge Planning: To home with family support      Primary Diagnoses: Present on Admission: . Hypertensive urgency   I have reviewed the medical record, interviewed the patient and family, and examined the patient. The following aspects are pertinent.  Past Medical History:  Diagnosis Date  . Anemia   . Anxiety   . Chronic kidney disease    DIALYSIS  . Depression   . Diabetes mellitus without complication ([place])   . Dialysis patient ([place])    Mon, Wed, Fri  . History of chemotherapy    chemo on Tuesday and Wednesday  . Hypertension   . Multiple myeloma ([place])   . Neuropathy associated with cancer ([place])   . Osteoarthritis   . Pancreatitis   . Pneumonia    September 2017, [facility]  . Post-menopausal 2014  . Sepsis [facility])    Social History   Social History  . Marital status: Married    Spouse name: N/A  . Number of children: N/A  . Years of education: N/A   Occupational History  . retired    Social History Main Topics  . Smoking status: Never Smoker  . Smokeless tobacco: Never Used  . Alcohol use No  . Drug use: No  . Sexual activity: Not Asked    Other Topics Concern  . None   Social History Narrative  . None   Family History  Problem Relation Age of Onset  . Hypercholesterolemia Mother   . Hypertension Mother   . Hypertension Father    Scheduled Meds: . aspirin EC  81 mg Oral Daily  . cinacalcet  60 mg Oral Q supper  . cloNIDine  0.1 mg Oral BID  . epoetin (EPOGEN/PROCRIT) injection  10,000 Units Intravenous Q M,W,F-HD  . fentaNYL  25 mcg Transdermal Q72H  . hydrALAZINE  25 mg Oral Q8H  . insulin aspart  0-15 Units Subcutaneous TID WC & HS  . irbesartan  300 mg Oral QHS  . levETIRAcetam  500 mg Oral Daily   And  . levETIRAcetam  250 mg Oral Once per day on Mon Wed Fri  . mouth rinse  15 mL Mouth Rinse BID  . metoprolol tartrate  25 mg Oral BID  . MUSCLE RUB   Topical BID  . ondansetron (ZOFRAN) IV  4 mg Intravenous TID AC  . ondansetron  4 mg Oral Once  . senna  1 tablet Oral Daily  . sodium chloride flush  10-40 mL Intracatheter Q12H  . verapamil  120 mg Oral Daily   Continuous Infusions: . sodium chloride    . albumin human    . piperacillin-tazobactam (ZOSYN)  IV Stopped (11/24/2016 0218)  . [START ON 11/19/2016] vancomycin    . vancomycin     PRN Meds:.acetaminophen **OR** acetaminophen, albuterol, labetalol, LORazepam, ondansetron **OR** ondansetron (ZOFRAN) IV, oxyCODONE, prochlorperazine, sodium chloride flush No Known Allergies Review of Systems +nausea, exhaustion, knee pain, the feeling of pins and needles in her feet.  Physical Exam  Well developed ill appearing female, awake, fatigued appearing, coherent CV rrr with 3/6 systolic murmur Resp no distress Abdomen soft, +bs LE with no swelling.  Vital Signs: BP (!) 154/53   Pulse 73   Temp 98.6 F (37 C)   Resp 16   Ht 5' (1.524 m)   Wt 50.9 kg (112 lb 3.4 oz)   LMP 02/25/1995 Comment: Tubal ligation 1996  SpO2 97%   BMI 21.92 kg/m  Pain Assessment: No/denies pain   Pain Score: 0-No pain   SpO2: SpO2: 97 % O2 Device:SpO2: 97 % O2  Flow Rate: .O2 Flow Rate (L/min): 2 L/min  IO: Intake/output summary:  Intake/Output Summary (Last 24 hours) at 11/15/2016 1513 Last data filed at 11/19/2016 1025  Gross per 24 hour  Intake              120 ml  Output                0 ml  Net              120 ml    LBM: Last BM Date: 11/19/2016 Baseline Weight: Weight: 47.2 kg (104 lb) Most recent weight: Weight: 50.9 kg (112 lb 3.4 oz)     Palliative Assessment/Data:   Flowsheet Rows     Most Recent Value  Intake Tab  Referral Department  Hospitalist  Unit at Time of Referral  Cardiac/Telemetry Unit  Palliative Care Primary Diagnosis  Cancer  Date Notified  11/16/16  Palliative Care Type  New Palliative care  Reason for referral  Clarify Goals of Care  Date of Admission  11/30/2016  Date first seen by Palliative Care  11/14/2016  # of days Palliative referral response time  1 Day(s)  # of days IP prior to Palliative referral  8  Clinical Assessment  Palliative Performance Scale Score  30%  Psychosocial & Spiritual Assessment  Palliative Care Outcomes      Time In: 2:00 Time Out: 3:30 Time Total: 90 min Greater than 50%  of this time was spent counseling and coordinating care related to the above assessment and plan.  Signed by: Florentina Jenny, PA-C Palliative Medicine Pager: [phone]  Please contact Palliative Medicine Team phone at [phone]  for questions and concerns.  For individual provider: See Shea Evans

## 2016-11-17 NOTE — Progress Notes (Signed)
Hd vitals, pt stable, no c/o, see notes

## 2016-11-17 NOTE — Consult Note (Signed)
[facility] Consult Note  MRN : [id number]  Jamie Keller is a 51 y.o. (14-Mar-1965) female who presents with chief complaint of  Chief Complaint  Patient presents with  . Seizures  .  History of Present Illness: I am asked to see the patient by Dr. Candiss Norse for evaluation of a non-functional access.  She has an AV graft in her left arm placed last year.  It has had one intervention several months ago.  This was working reasonably well until she went to dialysis today at which point they were unable to get venous blood return and were essentially "pulling clots". She has not had significant swelling in her arm. She has some pain from the access sites but is pretty mild. No fever or chills. She has been in the hospital for over a week with multiple ongoing issues and is quite debilitated. She presented with seizures and her multiple myeloma has become quite progressed. She is now short of breath with rest and is on oxygen. There was no trauma, injury, or inciting event that caused the access to not work. It had worked fine until today.  Current Facility-Administered Medications  Medication Dose Route Frequency Provider Last Rate Last Dose  . [MAR Hold] 0.9 %  sodium chloride infusion   Intravenous Once Gouru, Aruna, MD      . 0.9 %  sodium chloride infusion   Intravenous Continuous Algernon Huxley, MD 20 mL/hr at 11/20/2016 1529    . [MAR Hold] acetaminophen (TYLENOL) tablet 650 mg  650 mg Oral Q6H PRN Saundra Shelling, MD   650 mg at 11/14/16 0943   Or  . [MAR Hold] acetaminophen (TYLENOL) suppository 650 mg  650 mg Rectal Q6H PRN Saundra Shelling, MD   650 mg at 12/01/2016 1641  . [MAR Hold] albumin human 25 % solution 12.5 g  12.5 g Intravenous Once Kolluru, Lurena Nida, MD      . [facility] Hold] albuterol (PROVENTIL) (2.5 MG/3ML) 0.083% nebulizer solution 2.5 mg  2.5 mg Nebulization Q4H PRN Kolluru, Sarath, MD   2.5 mg at 11/12/16 2304  . [MAR Hold] aspirin EC tablet 81 mg   81 mg Oral Daily Saundra Shelling, MD   81 mg at 11/26/2016 0936  . cefUROXime (ZINACEF) 1.5 g in dextrose 5 % 50 mL IVPB  1.5 g Intravenous 30 min Pre-Op Algernon Huxley, MD 100 mL/hr at 11/16/2016 1530    . [MAR Hold] cinacalcet (SENSIPAR) tablet 60 mg  60 mg Oral Q supper Kolluru, Sarath, MD   60 mg at 11/16/16 1614  . [MAR Hold] cloNIDine (CATAPRES) tablet 0.1 mg  0.1 mg Oral BID Gouru, Aruna, MD   0.1 mg at 11/29/2016 1000  . [MAR Hold] epoetin alfa (EPOGEN,PROCRIT) injection 10,000 Units  10,000 Units Intravenous Q M,W,F-HD Lavonia Dana, MD   10,000 Units at 11/15/16 1732  . [MAR Hold] fentaNYL (DURAGESIC - dosed mcg/hr) patch 25 mcg  25 mcg Transdermal Q72H Saundra Shelling, MD   25 mcg at 12/04/2016 0936  . [MAR Hold] hydrALAZINE (APRESOLINE) tablet 25 mg  25 mg Oral Q8H Gouru, Aruna, MD   25 mg at 12/04/2016 0504  . [MAR Hold] insulin aspart (novoLOG) injection 0-15 Units  0-15 Units Subcutaneous TID WC & HS Mikael Spray, NP   2 Units at 11/16/16 1205  . [MAR Hold] irbesartan (AVAPRO) tablet 300 mg  300 mg Oral QHS Murlean Iba, MD   300 mg at 11/16/16 2218  . [facility] Hold]  labetalol (NORMODYNE,TRANDATE) injection 10 mg  10 mg Intravenous Q2H PRN Lance Coon, MD   10 mg at 11/12/16 0909  . [MAR Hold] levETIRAcetam (KEPPRA) tablet 500 mg  500 mg Oral Daily Epifanio Lesches, MD   500 mg at 11/23/2016 0936   And  . [MAR Hold] levETIRAcetam (KEPPRA) tablet 250 mg  250 mg Oral Once per day on Mon Wed Fri Epifanio Lesches, MD   250 mg at 11/12/16 1839  . [MAR Hold] LORazepam (ATIVAN) injection 2 mg  2 mg Intravenous Q4H PRN Saundra Shelling, MD   2 mg at 11/13/16 2251  . [facility] Hold] MEDLINE mouth rinse  15 mL Mouth Rinse BID Tukov, Magadalene S, NP   15 mL at 11/14/16 1000  . [MAR Hold] metoprolol tartrate (LOPRESSOR) tablet 25 mg  25 mg Oral BID Gouru, Aruna, MD   25 mg at 11/07/2016 1000  . [MAR Hold] MUSCLE RUB CREA   Topical BID Dellinger, Bobby Rumpf, PA-C      . [MAR Hold] ondansetron (ZOFRAN)  tablet 4 mg  4 mg Oral Q6H PRN Saundra Shelling, MD       Or  . Doug Sou Hold] ondansetron (ZOFRAN) injection 4 mg  4 mg Intravenous Q6H PRN Saundra Shelling, MD   4 mg at 11/15/16 0856  . [MAR Hold] ondansetron (ZOFRAN) injection 4 mg  4 mg Intravenous TID AC Dellinger, Marianne L, PA-C      . [MAR Hold] ondansetron (ZOFRAN) tablet 4 mg  4 mg Oral Once Kolluru, Lurena Nida, MD      . [facility] Hold] oxyCODONE (Oxy IR/ROXICODONE) immediate release tablet 10 mg  10 mg Oral Q4H PRN Epifanio Lesches, MD   10 mg at 11/16/16 0624  . [MAR Hold] piperacillin-tazobactam (ZOSYN) IVPB 3.375 g  3.375 g Intravenous Q12H Coffee, Donna Christen, [facility]   Stopped at 11/26/2016 2831  . [MAR Hold] prochlorperazine (COMPAZINE) tablet 10 mg  10 mg Oral Q6H PRN Saundra Shelling, MD   10 mg at 11/16/16 0805  . [MAR Hold] senna (SENOKOT) tablet 8.6 mg  1 tablet Oral Daily Pyreddy, Pavan, MD   8.6 mg at 11/16/2016 0934  . [MAR Hold] sodium chloride flush (NS) 0.9 % injection 10-40 mL  10-40 mL Intracatheter Q12H Epifanio Lesches, MD   10 mL at 11/11/2016 0936  . [MAR Hold] sodium chloride flush (NS) 0.9 % injection 10-40 mL  10-40 mL Intracatheter PRN Epifanio Lesches, MD      . [facility] Hold] vancomycin (VANCOCIN) 500 mg in sodium chloride 0.9 % 100 mL IVPB  500 mg Intravenous Q M,W,F-HD Ramond Dial, RPH      . [MAR Hold] vancomycin (VANCOCIN) IVPB 750 mg/150 ml premix  750 mg Intravenous Q Wed-HD Ramond Dial, RPH      . [MAR Hold] verapamil (CALAN-SR) CR tablet 120 mg  120 mg Oral Daily Gouru, Aruna, MD   120 mg at 11/16/16 0806   Facility-Administered Medications Ordered in Other Encounters  Medication Dose Route Frequency Provider Last Rate Last Dose  . LORazepam (ATIVAN) injection 1 mg  1 mg Intravenous Once Lloyd Huger, MD        Past Medical History:  Diagnosis Date  . Anemia   . Anxiety   . Chronic kidney disease    DIALYSIS  . Depression   . Diabetes mellitus without complication ([place])   . Dialysis patient  ([place])    Mon, Wed, Fri  . History of chemotherapy    chemo on Tuesday and Wednesday  .  Hypertension   . Multiple myeloma ([place])   . Neuropathy associated with cancer ([place])   . Osteoarthritis   . Pancreatitis   . Pneumonia    September 2017, [facility]  . Post-menopausal 2014  . Sepsis [facility])     Past Surgical History:  Procedure Laterality Date  . A/V SHUNT INTERVENTION Left 07/07/2016   Procedure: A/V Shunt Intervention;  Surgeon: Algernon Huxley, MD;  Location: [facility];  Service: Cardiovascular;  Laterality: Left;  . AV FISTULA PLACEMENT Left 02/20/2016   Procedure: INSERTION OF ARTERIOVENOUS (AV) GORE-TEX GRAFT ARM;  Surgeon: Katha Cabal, MD;  Location: [facility] ORS;  Service: Vascular;  Laterality: Left;  . CESAREAN SECTION     x2  . dialysis catheter placement    . PERIPHERAL VASCULAR CATHETERIZATION N/A 11/19/2015   Procedure: Dialysis/Perma Catheter Insertion;  Surgeon: Algernon Huxley, MD;  Location: [facility];  Service: Cardiovascular;  Laterality: N/A;  . PERIPHERAL VASCULAR CATHETERIZATION Left 03/10/2016   Procedure: Thrombectomy;  Surgeon: Katha Cabal, MD;  Location: [place] [facility];  Service: Cardiovascular;  Laterality: Left;  . PERIPHERAL VASCULAR CATHETERIZATION N/A 03/31/2016   Procedure: Dialysis/Perma Catheter Removal;  Surgeon: Katha Cabal, MD;  Location: [facility];  Service: Cardiovascular;  Laterality: N/A;  . PORTACATH PLACEMENT    . TUBAL LIGATION      Social History Social History  Substance Use Topics  . Smoking status: Never Smoker  . Smokeless tobacco: Never Used  . Alcohol use No  No IVDU  Family History Family History  Problem Relation Age of Onset  . Hypercholesterolemia Mother   . Hypertension Mother   . Hypertension Father   No bleeding or clotting disorders  No Known Allergies   REVIEW OF SYSTEMS (Negative unless checked)  Constitutional: [] Weight loss  [] Fever  [] Chills Cardiac: [] Chest pain    [] Chest pressure   [] Palpitations   [] Shortness of breath when laying flat   [x] Shortness of breath at rest   [] Shortness of breath with exertion. Vascular:  [] Pain in legs with walking   [] Pain in legs at rest   [] Pain in legs when laying flat   [] Claudication   [] Pain in feet when walking  [] Pain in feet at rest  [] Pain in feet when laying flat   [] History of DVT   [] Phlebitis   [x] Swelling in legs   [] Varicose veins   [] Non-healing ulcers Pulmonary:   [x] Uses home oxygen   [] Productive cough   [] Hemoptysis   [] Wheeze  [] COPD   [] Asthma Neurologic:  [x] Dizziness  [] Blackouts   [x] Seizures   [] History of stroke   [] History of TIA  [] Aphasia   [] Temporary blindness   [] Dysphagia   [x] Weakness or numbness in arms   [x] Weakness or numbness in legs Musculoskeletal:  [x] Arthritis   [] Joint swelling   [] Joint pain   [] Low back pain Hematologic:  [] Easy bruising  [] Easy bleeding   [] Hypercoagulable state   [x] Anemic  [] Hepatitis Gastrointestinal:  [] Blood in stool   [] Vomiting blood  [] Gastroesophageal reflux/heartburn   [] Difficulty swallowing. Genitourinary:  [x] Chronic kidney disease   [] Difficult urination  [] Frequent urination  [] Burning with urination   [] Blood in urine Skin:  [] Rashes   [] Ulcers   [] Wounds Psychological:  [] History of anxiety   []  History of major depression.  Physical Examination  Vitals:   11/18/2016 0900 11/07/2016 1115 11/13/2016 1200 11/10/2016 1520  BP: (!) 177/55 (!) 157/55 (!) 154/53   Pulse: 76 75 73  Resp: 18 13 16 18   Temp: 97.8 F (36.6 C) (!) 97.5 F (36.4 C) 98.6 F (37 C) 98 F (36.7 C)  TempSrc: Oral Oral  Oral  SpO2: 99% 97% 97%   Weight:    50.8 kg (112 lb)  Height:    5' (1.524 m)   Body mass index is 21.87 kg/m. Gen:  Thin debilitated female. Head: [place]/AT, + temporalis wasting.  Ear/Nose/Throat: Hearing grossly intact, nares w/o erythema or drainage, oropharynx w/o Erythema/Exudate Eyes: Sclera non-icteric, conjunctiva clear Neck: Trachea midline.  No  JVD.  Pulmonary:  Good air movement, respirations not labored, equal bilaterally.  Cardiac: RRR, normal S1, S2. Vascular: thrill is present in left arm AVG Vessel Right Left  Radial Palpable Palpable                                    Musculoskeletal: M/S 5/5 throughout.  Extremities without ischemic changes.  No deformity or atrophy. 1-2+ LE edema. Neurologic: Sensation grossly intact in extremities.  Symmetrical.  Speech is fluent. Motor exam as listed above. Psychiatric: Judgment intact, Mood & affect appropriate for pt's clinical situation. Dermatologic: No rashes or ulcers noted.  No cellulitis or open wounds. Lymph : No Cervical, Axillary, or Inguinal lymphadenopathy.      CBC Lab Results  Component Value Date   WBC 3.9 11/20/2016   HGB 8.3 (L) 11/16/2016   HCT 24.0 (L) 11/19/2016   MCV 88.8 11/20/2016   PLT 20 (LL) 11/12/2016    BMET    Component Value Date/Time   NA 131 (L) 11/09/2016 0454   NA 137 06/27/2014 1344   K 4.5 12/01/2016 0454   K 4.0 06/27/2014 1344   CL 90 (L) 11/07/2016 0454   CL 106 06/27/2014 1344   CO2 28 11/10/2016 0454   CO2 26 06/27/2014 1344   GLUCOSE 111 (H) 11/23/2016 0454   GLUCOSE 83 06/27/2014 1344   BUN 30 (H) 11/13/2016 0454   BUN 27 (H) 06/27/2014 1344   CREATININE 3.83 (H) 11/22/2016 0454   CREATININE 1.36 (H) 06/27/2014 1344   CALCIUM 12.1 (H) 11/07/2016 0454   CALCIUM 9.1 06/27/2014 1344   GFRNONAA 13 (L) 11/23/2016 0454   GFRNONAA 46 (L) 06/27/2014 1344   GFRAA 15 (L) 11/16/2016 0454   GFRAA 53 (L) 06/27/2014 1344   Estimated Creatinine Clearance: 12.3 mL/min (A) (by C-G formula based on SCr of 3.83 mg/dL (H)).  COAG Lab Results  Component Value Date   INR 1.27 02/17/2016   INR 1.51 01/14/2016    Radiology Dg Chest 2 View  Result Date: 11/14/2016 CLINICAL DATA:  Patient with shortness of breath. History of multiple myeloma. End-stage renal disease. EXAM: CHEST  2 VIEW COMPARISON:  Chest radiograph  11/11/2016 FINDINGS: Right anterior chest wall Port-A-Cath is present with tip projecting over the superior vena cava. Monitoring leads overlie the patient. Stable cardiomegaly. Right-greater-than-left diffuse interstitial pulmonary opacities. Moderate right and small left layering pleural effusions. No pneumothorax. IMPRESSION: Cardiomegaly with pulmonary edema and layering bilateral pleural effusions. Electronically Signed   By: Lovey Newcomer M.D.   On: 11/14/2016 10:49   Dg Knee 1-2 Views Left  Result Date: 11/15/2016 CLINICAL DATA:  Pain.  History of multiple myeloma EXAM: LEFT KNEE - 1-2 VIEW COMPARISON:  None. FINDINGS: Frontal lateral views were obtained it. There is no appreciable fracture or dislocation. No evident joint effusion. Joint spaces appear normal. There is slight  spurring medially. No blastic or lytic bone lesions are evident. IMPRESSION: Mild spurring medially. No appreciable joint space narrowing or erosion. No fracture or joint effusion. Electronically Signed   By: Lowella Grip III M.D.   On: 11/15/2016 13:42   Dg Knee 1-2 Views Right  Result Date: 11/15/2016 CLINICAL DATA:  Pain.  History of multiple myeloma EXAM: RIGHT KNEE - 1-2 VIEW COMPARISON:  October 22, 2016 FINDINGS: Frontal lateral views were obtained. There is a moderate joint effusion. No fracture or dislocation. There is a lytic appearing lesion distal femoral diaphysis measuring 1.2 x 0.4 cm. There is subtle lucency in the proximal tibial diaphysis as well. There is periosteal reaction along the medial proximal tibia without apparent fracture in this area. There is subtle nearby lucency. IMPRESSION: 1.  Joint effusion. 2.  Areas of lucency, potentially representing multiple myeloma. 3.  No acute appearing fracture or dislocation. 4. Periosteal reaction along the medial proximal tibial metaphysis with nearby lucency. Question traumatic etiology versus potential reaction to neoplasm or subtle osteomyelitis. This finding  may warrant MRI of the right knee to further evaluate the marrow in this region. Electronically Signed   By: Lowella Grip III M.D.   On: 11/15/2016 13:46   Dg Knee 1-2 Views Right  Result Date: 10/22/2016 CLINICAL DATA:  Right knee pain without known injury. EXAM: RIGHT KNEE - 1-2 VIEW COMPARISON:  None. FINDINGS: No evidence of fracture, dislocation, or joint effusion. Mild narrowing of medial joint space is noted. Soft tissues are unremarkable. IMPRESSION: Mild degenerative joint disease. No acute abnormality seen in the right knee. Electronically Signed   By: Marijo Conception, M.D.   On: 10/22/2016 10:27   Ct Head Wo Contrast  Result Date: 11/07/2016 CLINICAL DATA:  Altered level of consciousness.  Seizure. EXAM: CT HEAD WITHOUT CONTRAST TECHNIQUE: Contiguous axial images were obtained from the base of the skull through the vertex without intravenous contrast. COMPARISON:  Brain MRI 04/30/2016, head CT 12/08/2015 FINDINGS: Brain: Symmetric low-density within bilateral posterior parietooccipital lobes. No evidence of acute hemorrhage. No hydrocephalus, midline shift or mass effect. No subdural or extra-axial fluid collection. Vascular:  No hyperdense vessel. Skull: Suggestion of diffuse sclerosis. Subcentimeter lytic lesion in the high right frontoparietal calvarium is new from prior exam. Subcentimeter lytic lesion in the left frontal bone is also new. Unchanged lytic skullbase lesion. Smaller subcentimeter lesions in the left frontal bone are unchanged from previous. Sinuses/Orbits: Paranasal sinuses and mastoid air cells are clear. The visualized orbits are unremarkable. Other: None. IMPRESSION: 1. Symmetric low-density within posterior parietooccipital lobes suspicious for PRES (posterior reversible encephalopathy syndrome). Consider MRI for further evaluation. 2. Subcentimeter calvarial lesions in right and left frontal bone there are new from October 2017 head CT likely related to myeloma.  Electronically Signed   By: Jeb Levering M.D.   On: 11/15/2016 02:25   Mr Brain Wo Contrast  Result Date: 11/09/2016 CLINICAL DATA:  51 y/o  F; seizure episodes with hypertension. EXAM: MRI HEAD WITHOUT CONTRAST TECHNIQUE: Multiplanar, multiecho pulse sequences of the brain and surrounding structures were obtained without intravenous contrast. COMPARISON:  11/16/2016 CT head.  04/30/2016 MRI head. FINDINGS: Brain: T2 FLAIR hyperintense signal abnormality involving cortex and subcortical white matter with a predominantly posterior distribution in the supratentorial brain, typical of PRES. No reduced diffusion to suggest acute or early subacute infarction. No abnormal susceptibility hypointensity to indicate intracranial hemorrhage. There is cortical swelling with mild local mass effect associated with the areas of T2 FLAIR  signal abnormality. No effacement of basilar cisterns, herniation, or extra-axial collection. Mild diffuse brain parenchymal volume loss. Vascular: Normal flow voids. 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 Sinuses/Orbits: Negative. Other: None. IMPRESSION: 1. Findings typical of acute hypertensive encephalopathy, PRES, involving the posterior supratentorial brain. 2. No evidence for acute infarction, hemorrhage, or significant mass effect. 3. Mild brain parenchymal volume loss. Electronically Signed   By: Kristine Garbe M.D.   On: 11/09/2016 03:09   Dg Chest Port 1 View  Result Date: 11/11/2016 CLINICAL DATA:  Shortness of breath. EXAM: PORTABLE CHEST 1 VIEW COMPARISON:  November 08, 2016 FINDINGS: The distal tip of the right Port-A-Cath terminates near the caval atrial junction. Increasing bilateral pulmonary opacities and probable effusions, left greater than right are identified. No pneumothorax. No other changes. IMPRESSION: Suspected developing effusions and edema. An infectious etiology is  considered less likely. Electronically Signed   By: Dorise Bullion III M.D   On: 11/11/2016 16:18   Dg Chest Port 1 View  Result Date: 11/16/2016 CLINICAL DATA:  Cough.  Seizure-like activity. EXAM: PORTABLE CHEST 1 VIEW COMPARISON:  Chest radiograph 05/26/2016. FINDINGS: Tip of the right chest port in the distal SVC. Heart size upper normal. Mediastinal contours are normal. Improved bilateral pleural effusions from prior exam, suspect small volume residual or recurrent pleural fluid. Improved pulmonary edema from prior exam. Bony sclerosis with remote bilateral rib fractures, likely related to myeloma. Lytic lesion in the right clavicle. IMPRESSION: 1. Small pleural effusions, improved from prior exam, may be persistent or recurrent. Improved pulmonary edema, mild residual or recurrent vascular congestion. 2. Bony scleroses and bilateral rib fractures likely related to myeloma. Electronically Signed   By: Jeb Levering M.D.   On: 11/12/2016 02:36      Assessment/Plan 1. Dysfunction of dialysis access. A run able to access and run her AV graft today which has been working normally up until today. Given this finding, a shuntogram with possible intervention will be performed to try to make dysfunctional. It was discussed that if this could not be opened, a catheter would have to be placed. Risks and benefits were discussed with the patient and she is agreeable to proceed. 2. End-stage renal disease. Access issues as above. 3. Multiple myeloma. Severe. Likely an underlying cause of her renal failure. 4. HTN. Stable on outpatient medications and blood pressure control important in reducing the progression of atherosclerotic disease. On appropriate oral medications.    Leotis Pain, MD  11/07/2016 3:34 PM    This note was created with Dragon medical transcription system.  Any error is purely unintentional

## 2016-11-17 NOTE — Progress Notes (Signed)
PT Attempt Note  Patient Details Name: Jamie Keller MRN: [id number] DOB: [birth year]  Treatment Attempt:    Reason Eval/Treat Not Completed: Patient at procedure or test/unavailable. Chart reviewed. Attempted to see patient however she is currently out of room for a procedure. Will attempt on later date/time as pt is available.  Lyndel Safe Tiyah Zelenak PT, DPT   Kate Sweetman 11/20/2016, 4:50 PM

## 2016-11-17 NOTE — Progress Notes (Signed)
Pt arrived for HD with bandages from previous HD tx 11/15/16 still on AVF. Arterial cannulated and flushed. Venous cannulated twice and was unsuccessful due to clotted venous site. Candiss Norse MD aware. Pt stable, report to primary RN, pt returned to room.

## 2016-11-17 NOTE — Progress Notes (Signed)
Chaplain responded to OR for "Pallative Care." Pt visit the pt in Cath Lab, but was not able to meet with pt because pt had just been taken into the procedure with this interpretor. [place] talked with nurses in the Cath Lab and will follow up pt as needed.   11/20/2016 1600  Clinical Encounter Type  Visited With Patient;Health care provider  Visit Type Initial;Spiritual support;Other (Comment)  Referral From Nurse  Consult/Referral To Chaplain  Spiritual Encounters  Spiritual Needs Other (Comment)

## 2016-11-17 NOTE — Progress Notes (Signed)
[place] INFECTIOUS DISEASE PROGRESS NOTE Date of Admission:  11/20/2016     ID: Jamie Keller is a 51 y.o. female with MM, fevers Active Problems:   Hypertensive urgency   Hypercalcemia   Status epilepticus (HCC)   Subjective: Seems stronger and more alert today. No fevers. Complains of nausea vomiting.   ROS  Eleven systems are reviewed and negative except per hpi  Medications:  Antibiotics Given (last 72 hours)    Date/Time Action Medication Dose Rate   11/14/16 2112 New Bag/Given   [MAR Hold] piperacillin-tazobactam (ZOSYN) IVPB 3.375 g (MAR Hold since 11/28/2016 1517) 3.375 g 12.5 mL/hr   11/15/16 0856 New Bag/Given   [MAR Hold] piperacillin-tazobactam (ZOSYN) IVPB 3.375 g (MAR Hold since 11/10/2016 1517) 3.375 g 12.5 mL/hr   11/15/16 1732 New Bag/Given   vancomycin (VANCOCIN) 500 mg in sodium chloride 0.9 % 100 mL IVPB 500 mg 100 mL/hr   11/15/16 2230 New Bag/Given   [MAR Hold] piperacillin-tazobactam (ZOSYN) IVPB 3.375 g (MAR Hold since 12/02/2016 1517) 3.375 g 12.5 mL/hr   11/16/16 0805 New Bag/Given   [MAR Hold] piperacillin-tazobactam (ZOSYN) IVPB 3.375 g (MAR Hold since 11/06/2016 1517) 3.375 g 12.5 mL/hr   11/16/16 2218 New Bag/Given   [MAR Hold] piperacillin-tazobactam (ZOSYN) IVPB 3.375 g (MAR Hold since 11/27/2016 1517) 3.375 g 12.5 mL/hr   11/16/2016 1530 New Bag/Given   cefUROXime (ZINACEF) 1.5 g in dextrose 5 % 50 mL IVPB  100 mL/hr     . fentaNYL      . midazolam      . [MAR Hold] aspirin EC  81 mg Oral Daily  . [MAR Hold] cinacalcet  60 mg Oral Q supper  . [MAR Hold] cloNIDine  0.1 mg Oral BID  . [MAR Hold] epoetin (EPOGEN/PROCRIT) injection  10,000 Units Intravenous Q M,W,F-HD  . [MAR Hold] fentaNYL  25 mcg Transdermal Q72H  . [MAR Hold] hydrALAZINE  25 mg Oral Q8H  . [MAR Hold] insulin aspart  0-15 Units Subcutaneous TID WC & HS  . [MAR Hold] irbesartan  300 mg Oral QHS  . [MAR Hold] levETIRAcetam  500 mg Oral Daily   And  . [MAR Hold]  levETIRAcetam  250 mg Oral Once per day on Mon Wed Fri  . [MAR Hold] mouth rinse  15 mL Mouth Rinse BID  . [MAR Hold] metoprolol tartrate  25 mg Oral BID  . [MAR Hold] MUSCLE RUB   Topical BID  . [MAR Hold] ondansetron (ZOFRAN) IV  4 mg Intravenous TID AC  . [MAR Hold] ondansetron  4 mg Oral Once  . [MAR Hold] senna  1 tablet Oral Daily  . [MAR Hold] sodium chloride flush  10-40 mL Intracatheter Q12H  . [MAR Hold] verapamil  120 mg Oral Daily    Objective: Vital signs in last 24 hours: Temp:  [97.5 F (36.4 C)-98.6 F (37 C)] 98 F (36.7 C) (09/12 1520) Pulse Rate:  [58-76] 73 (09/12 1200) Resp:  [13-18] 18 (09/12 1520) BP: (94-177)/(39-62) 154/53 (09/12 1200) SpO2:  [97 %-100 %] 97 % (09/12 1200) Weight:  [50.8 kg (112 lb)] 50.8 kg (112 lb) (09/12 1520) Constitutional: alert  frail appearing,  HENT: [place]/AT, pale Mouth/Throat: Oropharynx is clear and dry . No oropharyngeal exudate.  Cardiovascular: Normal rate, regular rhythm and normal heart sounds. Pulmonary/Chest: Effort normal and breath sounds normal. No respiratory distress. has no wheezes.  Neck = supple, no nuchal rigidity R chest wall Portacath in place L ue avf  Abdominal: Soft. Bowel  sounds are normal. exhibits no distension. There is no tenderness.  Lymphadenopathy: no cervical adenopathy. No axillary adenopathy Neurological: alert and oriented to person, place, and time.  Skin: Skin is warm and dry. No rash noted. No erythema.  Psychiatric: a normal mood and affect. behavior is normal.   Lab Results  Recent Labs  11/15/16 1149 11/16/16 0600 11/16/16 1824 12/01/2016 0454  WBC 2.2* 2.7*  --  3.9  HGB 6.2* 5.9* 7.7* 8.3*  HCT 17.9* 17.4* 22.4* 24.0*  NA 133*  --   --  131*  K 4.5  --   --  4.5  CL 93*  --   --  90*  CO2 28  --   --  28  BUN 28*  --   --  30*  CREATININE 3.93*  --   --  3.83*    Microbiology: Results for orders placed or performed during the hospital encounter of 12/02/2016  MRSA PCR  Screening     Status: None   Collection Time: 11/15/2016  3:41 AM  Result Value Ref Range Status   MRSA by PCR NEGATIVE NEGATIVE Final    Comment:        The GeneXpert MRSA Assay (FDA approved for NASAL specimens only), is one component of a comprehensive MRSA colonization surveillance program. It is not intended to diagnose MRSA infection nor to guide or monitor treatment for MRSA infections.   Urine Culture     Status: Abnormal   Collection Time: 11/10/16  2:19 AM  Result Value Ref Range Status   Specimen Description URINE, RANDOM  Final   Special Requests NONE  Final   Culture MULTIPLE SPECIES PRESENT, SUGGEST RECOLLECTION (A)  Final   Report Status 11/11/2016 FINAL  Final  CULTURE, BLOOD (ROUTINE X 2) w Reflex to ID Panel     Status: None (Preliminary result)   Collection Time: 11/14/16  8:26 AM  Result Value Ref Range Status   Specimen Description BLOOD RIGHT ANTECUBITAL  Final   Special Requests   Final    BOTTLES DRAWN AEROBIC AND ANAEROBIC Blood Culture adequate volume   Culture NO GROWTH 3 DAYS  Final   Report Status PENDING  Incomplete  CULTURE, BLOOD (ROUTINE X 2) w Reflex to ID Panel     Status: None (Preliminary result)   Collection Time: 11/14/16  8:38 AM  Result Value Ref Range Status   Specimen Description BLOOD BLOOD RIGHT HAND  Final   Special Requests   Final    BOTTLES DRAWN AEROBIC AND ANAEROBIC Blood Culture adequate volume   Culture NO GROWTH 3 DAYS  Final   Report Status PENDING  Incomplete    Studies/Results: No results found.  Assessment/Plan: Jamie Keller a 51 y.o.femalewith ESRD and advanced myeloma admitted with seizures and HTN emergency with PRES, then on hospital day 6 developed  fevers, knee pain and progressive lymphopenia, anemia and TCP. She is on Ninlaro for MM (has been associated with cytopenias and PRES).Recently started on keppra so drug fever possible although less likely. Multiple possible etiologies including  line infection with portacath, PNA, bacteremia, acut gout with knee pain. Also question If delayed transfusion reaction possible - received PRBC but HGB remains quite low. BCX neg, UCX mixed. No obvious source of infection. Cultures negative   Recommendations DC vanco and zosyn. Would give augmentin for 3 more days. Monitor for recurrent fevers.   Thank you very much for the consult. Will follow with you.  Leonel Ramsay   11/17/2016,  3:39 PM

## 2016-11-17 NOTE — Care Management (Addendum)
Palliative met with patient and family.  Patient wishes to continue with dialysis and chemo.  She wishes to discharge home and will not consider a facility.  Family at bedside are in agreement. Family confirms a medicaid application has been initiated but have not heard anything on status of the medicaid application "in months."  Caseworker is Ms Leonides Schanz.and family says they have left many messages which have not been returned.  This CM left voicemail message for this caseworker.  Patient will need a hospital bed at discharge.  She has a wheelchair and walker.  Spoke with [facility] regarding [facility]/ hospital bed.  Patient's dialysis catheter has malfunctioned and vascular will treat today.

## 2016-11-17 NOTE — Progress Notes (Signed)
Patient here for declot per Dr Lucky Cowboy, interpreter present with patient, questions answered, consent signed for procedure. Vitals stable.

## 2016-11-18 ENCOUNTER — Encounter: Payer: Self-pay | Admitting: Vascular Surgery

## 2016-11-18 DIAGNOSIS — I6783 Posterior reversible encephalopathy syndrome: Secondary | ICD-10-CM

## 2016-11-18 DIAGNOSIS — N186 End stage renal disease: Secondary | ICD-10-CM

## 2016-11-18 DIAGNOSIS — Z992 Dependence on renal dialysis: Secondary | ICD-10-CM

## 2016-11-18 LAB — CBC
HCT: 24.2 % — ABNORMAL LOW (ref 35.0–47.0)
Hemoglobin: 8.5 g/dL — ABNORMAL LOW (ref 12.0–16.0)
MCH: 31.5 pg (ref 26.0–34.0)
MCHC: 35.1 g/dL (ref 32.0–36.0)
MCV: 89.6 fL (ref 80.0–100.0)
PLATELETS: 20 10*3/uL — AB (ref 150–440)
RBC: 2.7 MIL/uL — ABNORMAL LOW (ref 3.80–5.20)
RDW: 15.1 % — AB (ref 11.5–14.5)
WBC: 5 10*3/uL (ref 3.6–11.0)

## 2016-11-18 LAB — RENAL FUNCTION PANEL
ALBUMIN: 2.2 g/dL — AB (ref 3.5–5.0)
Anion gap: 13 (ref 5–15)
BUN: 42 mg/dL — AB (ref 6–20)
CHLORIDE: 89 mmol/L — AB (ref 101–111)
CO2: 26 mmol/L (ref 22–32)
CREATININE: 5.17 mg/dL — AB (ref 0.44–1.00)
Calcium: 11.7 mg/dL — ABNORMAL HIGH (ref 8.9–10.3)
GFR calc Af Amer: 10 mL/min — ABNORMAL LOW (ref >60)
GFR, EST NON AFRICAN AMERICAN: 9 mL/min — AB (ref >60)
Glucose, Bld: 143 mg/dL — ABNORMAL HIGH (ref 65–99)
Phosphorus: 5.6 mg/dL — ABNORMAL HIGH (ref 2.5–4.6)
Potassium: 4.4 mmol/L (ref 3.5–5.1)
SODIUM: 128 mmol/L — AB (ref 135–145)

## 2016-11-18 LAB — GLUCOSE, CAPILLARY
GLUCOSE-CAPILLARY: 114 mg/dL — AB (ref 65–99)
GLUCOSE-CAPILLARY: 179 mg/dL — AB (ref 65–99)
GLUCOSE-CAPILLARY: 116 mg/dL — AB (ref 65–99)
Glucose-Capillary: 111 mg/dL — ABNORMAL HIGH (ref 65–99)

## 2016-11-18 MED ORDER — VANCOMYCIN HCL 500 MG IV SOLR
250.0000 mg | Freq: Once | INTRAVENOUS | Status: AC
  Administered 2016-11-18: 250 mg via INTRAVENOUS
  Filled 2016-11-18 (×3): qty 250

## 2016-11-18 MED ORDER — VANCOMYCIN HCL IN DEXTROSE 750-5 MG/150ML-% IV SOLN
750.0000 mg | INTRAVENOUS | Status: DC
  Filled 2016-11-18: qty 150

## 2016-11-18 NOTE — Progress Notes (Signed)
Pre hd info 

## 2016-11-18 NOTE — Progress Notes (Signed)
[facility] at [place] NAME: Jamie Keller    MR#:  [id number]  DATE OF BIRTH:  September 23, 1965    CHIEF COMbloINT:   Chief Complaint  Patient presents with  . Seizures  Patient is very weak - could not have HD today as her fistula is clotted, sleepy.  REVIEW OF SYSTEMS:  Review of Systems  Constitutional: Positive for fever. Negative for chills.  HENT: Negative for hearing loss.   Eyes: Negative for blurred vision, double vision and photophobia.  Respiratory: Negative for cough, hemoptysis and shortness of breath.   Cardiovascular: Negative for palpitations, orthopnea and leg swelling.  Gastrointestinal: Negative for abdominal pain, diarrhea and vomiting.  Genitourinary: Negative for dysuria and urgency.  Musculoskeletal: Positive for joint pain. Negative for myalgias and neck pain.  Skin: Negative for rash.  Neurological: Negative for dizziness, focal weakness, seizures, weakness and headaches.  Psychiatric/Behavioral: Negative for memory loss. The patient does not have insomnia.     DRUG ALLERGIES:  No Known Allergies VITALS:  Blood pressure (!) 126/55, pulse 87, temperature 97.8 F (36.6 C), temperature source Oral, resp. rate 20, height 5' (1.524 m), weight 50.8 kg (112 lb), last menstrual period 02/25/1995, SpO2 98 %. PHYSICAL EXAMINATION:  Physical Exam  Constitutional:  Alert, awake, oriented.  HENT:  Head: Normocephalic.  Mouth/Throat: Oropharynx is clear and moist.  Eyes: No scleral icterus.  Neck: Neck supple. No JVD present. No tracheal deviation present.  Cardiovascular: Normal rate, regular rhythm and normal heart sounds.  Exam reveals no gallop.   No murmur heard. Pulmonary/Chest: Effort normal and breath sounds normal. No respiratory distress. She has no wheezes. She has no rales.  Abdominal: Soft. Bowel sounds are normal. She exhibits no distension. There is no tenderness. There is no rebound.    Musculoskeletal: She exhibits no edema or tenderness.  Bilateral knee tenderness is present  Neurological: No cranial nerve deficit.  .  Skin: No rash noted. No erythema.   LABORATORY PANEL:  Female CBC  Recent Labs Lab 11/18/16 0409  WBC 5.0  HGB 8.5*  HCT 24.2*  PLT 20*   ------------------------------------------------------------------------------------------------------------------ Chemistries   Recent Labs Lab 11/15/16 1149  11/18/16 0409  NA 133*  < > 128*  K 4.5  < > 4.4  CL 93*  < > 89*  CO2 28  < > 26  GLUCOSE 202*  < > 143*  BUN 28*  < > 42*  CREATININE 3.93*  < > 5.17*  CALCIUM 11.3*  < > 11.7*  AST 22  --   --   ALT 11*  --   --   ALKPHOS 102  --   --   BILITOT 0.6  --   --   < > = values in this interval not displayed. RADIOLOGY:  No results found. ASSESSMENT AND PLAN:   IMPRESSION AND PLAN: 51 year old female patient with history of multiple myeloma, end-stage renal disease on dialysis, hypertension presented to the emergency room with seizure and elevated blood pressure.   #fever-etiology unclear Pancultures ordered,blood cultures are negative so far, urine cultures are pending Broad-spectrum IV antibiotics IV Zosyn and vancomycin started 11/14/2016 Chest x-ray with pulmonary edema Infectious Disease recommendations appreciated   Suggested to continue IV abx one more day.   Have diarrhea now. Will check C diff and GI panel.  #History of multiple myeloma with lytic bony lesions-pancytopenia Patient is complaining bilateral knee pain, could be from underlying multiple myeloma will get x-rays Discussed  with Dr. Grayland Ormond who reports that her prognosis is poor with no good treatment options and recommended palliative care consult , palliative care consult is placed  Patient is on fentanyl patch for the pain control. Patient has multiple antibodies but okay to transfuse blood per Dr. Grayland Ormond Monitor CBC and including platelet count  #.  Hypertensive urgency Clinically improving Continue metoprolol 25 mg , verapamil 120 mg and hydralazine 25 mg every 8 hours. Clonidine is added to the regimen for better control of the blood pressure, titrate as needed. Goal systolic blood pressure less than 160  #. Posterior reversible encephalopathy syndrome;MRI of the brain did not show any acute stroke. Showed only PRES>patient is off nicardipine drip. Transferred out of ICU. Patient had respiratory distress and shortness of breath and received emergency hemodialysis yesterday and she will get another hemodialysis today. Blood pressure is still high so increased the dose of metoprolol to 25 mg twice a day, added hydralazine 25 mg 3 times a day.clonidine is added to the regimen  #.New onset seizure; by mouth Keppra.  Renally dose the Keppra.  No other episodes of seizures noticed  # End-stage renal disease on dialysis,nephro is following Patient's shortness of breath improved after 3 consecutive days of hemodialysis Chest x-ray with pulmonary edema on 11/14/2016 had hemodialysis  Calcium level is high at 11.4,Sensipar dose increased to 60 mg   # clotted HD fistula   Nephro consulted vascular for procedure for declotting.   Declotting done, HD done today.  Prognosis really poor and high risk for cardiac arrest.. . All the records are reviewed and case discussed with Care Management/Social Worker. Management plans discussed with the patient, her daughter , and explained patient's poor prognosis with declining clinical status .he is agreeable with palliative care consult   CODE STATUS: Full Code  TOTAL TIME TAKING CARE OF THIS PATIENT: 35 minutes.   More than 50% of the time was spent in counseling/coordination of care: YES  POSSIBLE D/C IN 3 DAYS, DEPENDING ON CLINICAL CONDITION.   Vaughan Basta M.D on 11/18/2016 at 5:11 PM  Between 7am to 6pm - Pager - [phone]  After 6pm go to [email or web address] - Teacher, early years/pre Hospitalists

## 2016-11-18 NOTE — Progress Notes (Signed)
[facility] at [place] NAME: Jamie Keller    MR#:  [id number]  DATE OF BIRTH:  [birth year]    CHIEF COMbloINT:   Chief Complaint  Patient presents with  . Seizures  Patient is very weak - could not have HD today as her fistula is clotted, sleepy.  REVIEW OF SYSTEMS:  Review of Systems  Constitutional: Positive for fever. Negative for chills.  HENT: Negative for hearing loss.   Eyes: Negative for blurred vision, double vision and photophobia.  Respiratory: Negative for cough, hemoptysis and shortness of breath.   Cardiovascular: Negative for palpitations, orthopnea and leg swelling.  Gastrointestinal: Negative for abdominal pain, diarrhea and vomiting.  Genitourinary: Negative for dysuria and urgency.  Musculoskeletal: Positive for joint pain. Negative for myalgias and neck pain.  Skin: Negative for rash.  Neurological: Negative for dizziness, focal weakness, seizures, weakness and headaches.  Psychiatric/Behavioral: Negative for memory loss. The patient does not have insomnia.     DRUG ALLERGIES:  No Known Allergies VITALS:  Blood pressure (!) 126/55, pulse 87, temperature 97.8 F (36.6 C), temperature source Oral, resp. rate 20, height 5' (1.524 m), weight 50.8 kg (112 lb), last menstrual period 02/25/1995, SpO2 98 %. PHYSICAL EXAMINATION:  Physical Exam  Constitutional:  Alert, awake, oriented.  HENT:  Head: Normocephalic.  Mouth/Throat: Oropharynx is clear and moist.  Eyes: No scleral icterus.  Neck: Neck supple. No JVD present. No tracheal deviation present.  Cardiovascular: Normal rate, regular rhythm and normal heart sounds.  Exam reveals no gallop.   No murmur heard. Pulmonary/Chest: Effort normal and breath sounds normal. No respiratory distress. She has no wheezes. She has no rales.  Abdominal: Soft. Bowel sounds are normal. She exhibits no distension. There is no tenderness. There is no rebound.    Musculoskeletal: She exhibits no edema or tenderness.  Bilateral knee tenderness is present  Neurological: No cranial nerve deficit.  .  Skin: No rash noted. No erythema.   LABORATORY PANEL:  Female CBC  Recent Labs Lab 11/18/16 0409  WBC 5.0  HGB 8.5*  HCT 24.2*  PLT 20*   ------------------------------------------------------------------------------------------------------------------ Chemistries   Recent Labs Lab 11/15/16 1149  11/18/16 0409  NA 133*  < > 128*  K 4.5  < > 4.4  CL 93*  < > 89*  CO2 28  < > 26  GLUCOSE 202*  < > 143*  BUN 28*  < > 42*  CREATININE 3.93*  < > 5.17*  CALCIUM 11.3*  < > 11.7*  AST 22  --   --   ALT 11*  --   --   ALKPHOS 102  --   --   BILITOT 0.6  --   --   < > = values in this interval not displayed. RADIOLOGY:  No results found. ASSESSMENT AND PLAN:   IMPRESSION AND PLAN: 51 year old female patient with history of multiple myeloma, end-stage renal disease on dialysis, hypertension presented to the emergency room with seizure and elevated blood pressure.   #fever-etiology unclear Pancultures ordered,blood cultures are negative so far, urine cultures are pending Broad-spectrum IV antibiotics IV Zosyn and vancomycin started 11/14/2016 Chest x-ray with pulmonary edema Infectious Disease recommendations appreciated  #History of multiple myeloma with lytic bony lesions-pancytopenia Patient is complaining bilateral knee pain, could be from underlying multiple myeloma will get x-rays Discussed with Dr. Grayland Ormond who reports that her prognosis is poor with no good treatment options and recommended palliative care consult , palliative  care consult is placed  Patient is on fentanyl patch for the pain control. Patient has multiple antibodies but okay to transfuse blood per Dr. Grayland Ormond Monitor CBC and including platelet count   #. Hypertensive urgency Clinically improving Continue metoprolol 25 mg , verapamil 120 mg and hydralazine  25 mg every 8 hours. Clonidine is added to the regimen for better control of the blood pressure, titrate as needed. Goal systolic blood pressure less than 160  #. Posterior reversible encephalopathy syndrome;MRI of the brain did not show any acute stroke. Showed only PRES>patient is off nicardipine drip. Transferred out of ICU. Patient had respiratory distress and shortness of breath and received emergency hemodialysis yesterday and she will get another hemodialysis today. Blood pressure is still high so increased the dose of metoprolol to 25 mg twice a day, added hydralazine 25 mg 3 times a day.clonidine is added to the regimen  #.New onset seizure; by mouth Keppra.  Renally dose the Keppra.  No other episodes of seizures noticed  # End-stage renal disease on dialysis,nephro is following Patient's shortness of breath improved after 3 consecutive days of hemodialysis Chest x-ray with pulmonary edema on 11/14/2016 had hemodialysis  Calcium level is high at 11.4,Sensipar dose increased to 60 mg   # clotted HD fistula   Nephro consulted vascular for procedure for declotting.  Prognosis really poor and high risk for cardiac arrest.. . All the records are reviewed and case discussed with Care Management/Social Worker. Management plans discussed with the patient, her daughter , and explained patient's poor prognosis with declining clinical status .he is agreeable with palliative care consult   CODE STATUS: Full Code  TOTAL TIME TAKING CARE OF THIS PATIENT: 37 minutes.   More than 50% of the time was spent in counseling/coordination of care: YES  POSSIBLE D/C IN 3 DAYS, DEPENDING ON CLINICAL CONDITION.   Vaughan Basta M.D on 11/18/2016 at 5:05 PM  Between 7am to 6pm - Pager - [phone]  After 6pm go to [email or web address] - [employer] Hospitalists

## 2016-11-18 NOTE — Progress Notes (Signed)
Pre hd assessment  

## 2016-11-18 NOTE — Progress Notes (Signed)
Hd end, pt alert, c/o of stomach being upset, one bm, stable, report to primary rn

## 2016-11-18 NOTE — Progress Notes (Signed)
Post hd assessment 

## 2016-11-18 NOTE — Progress Notes (Signed)
Post hd vitals 

## 2016-11-18 NOTE — Progress Notes (Signed)
Daily Progress Note   Patient Name: Jamie Keller       Date: 11/18/2016 DOB: 05/19/1965  Age: 51 y.o. MRN#: [id number] Attending Physician: Vaughan Basta, * Primary Care Physician: Theotis Burrow, MD Admit Date: 11/07/2016  Reason for Consultation/Follow-up: Establishing goals of care  Subjective: Per RN patient was able to receive HD today but signed off 30 min early due to abdominal pain. Per Attending patient will stay tonight as a c-diff is being sent out and ID is still settling on oral antibiotics.  If all goes well she may be discharged to home tomorrow.  The patient (dtr translated) reports she is feeling better overall.  No vomiting so far today - but she also has not eaten yet today.  Her knees are still very painful and she is having pins and needles in her legs. The patient thanked me for the conversation yesterday.  She stated it is important to know what is going on with her body so that she can plan appropriately.    The dtr commented that the family (daughters) wanted to give the patient a day or two to process yesterday's conversation and then they will talk with her again about Hospice / code status / hemodialysis / choices.     Assessment: 64 yof with end stage multiple myeloma and ESRD on hemodialysis.  Very weak, symptomatic, wants to go home.   Patient Profile/HPI:  51 y.o. female  with past medical history of ESRD, multiple myeloma, diabetes mellitus, CHF,  who was admitted on 11/27/2016 with seizures and neutropenic fever.  She was having hypertensive crisis and developed PRES syndrome.  She has been battling multiple myeloma for approximately 4 years.  At this point she is so week that oncology does not advise further chemotherapy or radiation,  rather they recommend a palliative approach.   Length of Stay: 10  Current Medications: Scheduled Meds:  . aspirin EC  81 mg Oral Daily  . cinacalcet  60 mg Oral Q supper  . cloNIDine  0.1 mg Oral BID  . epoetin (EPOGEN/PROCRIT) injection  10,000 Units Intravenous Q M,W,F-HD  . fentaNYL  25 mcg Transdermal Q72H  . hydrALAZINE  25 mg Oral Q8H  . insulin aspart  0-15 Units Subcutaneous TID WC & HS  . irbesartan  300 mg Oral QHS  .  levETIRAcetam  500 mg Oral Daily   And  . levETIRAcetam  250 mg Oral Once per day on Mon Wed Fri  . mouth rinse  15 mL Mouth Rinse BID  . metoprolol tartrate  25 mg Oral BID  . MUSCLE RUB   Topical BID  . ondansetron (ZOFRAN) IV  4 mg Intravenous TID AC  . ondansetron  4 mg Oral Once  . senna  1 tablet Oral Daily  . sodium chloride flush  10-40 mL Intracatheter Q12H  . verapamil  120 mg Oral Daily    Continuous Infusions: . sodium chloride    . albumin human    . piperacillin-tazobactam (ZOSYN)  IV 3.375 g (11/18/16 1403)  . [START ON 11/19/2016] vancomycin Stopped (11/18/16 1347)  . vancomycin      PRN Meds: acetaminophen **OR** acetaminophen, albuterol, labetalol, LORazepam, ondansetron **OR** ondansetron (ZOFRAN) IV, oxyCODONE, prochlorperazine, sodium chloride flush  Physical Exam     Fatigued appearing 51 yo female.  Lying in bed NAD, A&O with clear speech. Dtr at bedside.  Vital Signs: BP (!) 126/55 (BP Location: Right Arm)   Pulse 87   Temp 97.8 F (36.6 C) (Oral)   Resp 20   Ht 5' (1.524 m)   Wt 50.8 kg (112 lb)   LMP 02/25/1995 Comment: Tubal ligation 1996  SpO2 98%   BMI 21.87 kg/m  SpO2: SpO2: 98 % O2 Device: O2 Device: Not Delivered O2 Flow Rate: O2 Flow Rate (L/min): 2 L/min  Intake/output summary:  Intake/Output Summary (Last 24 hours) at 11/18/16 1423 Last data filed at 11/18/16 1245  Gross per 24 hour  Intake                0 ml  Output             1300 ml  Net            -1300 ml   LBM: Last BM Date:  11/18/16 Baseline Weight: Weight: 47.2 kg (104 lb) Most recent weight: Weight: 50.8 kg (112 lb)       Palliative Assessment/Data:    Flowsheet Rows     Most Recent Value  Intake Tab  Referral Department  Hospitalist  Unit at Time of Referral  Cardiac/Telemetry Unit  Palliative Care Primary Diagnosis  Cancer  Date Notified  11/16/16  Palliative Care Type  New Palliative care  Reason for referral  Clarify Goals of Care  Date of Admission  11/20/2016  Date first seen by Palliative Care  11/30/2016  # of days Palliative referral response time  1 Day(s)  # of days IP prior to Palliative referral  8  Clinical Assessment  Palliative Performance Scale Score  30%  Psychosocial & Spiritual Assessment  Palliative Care Outcomes      Patient Active Problem List   Diagnosis Date Noted  . Palliative care encounter   . Goals of care, counseling/discussion   . Hypercalcemia 11/09/2016  . Status epilepticus ([place])   . Hypertensive urgency 11/12/2016  . Symptomatic anemia 10/20/2016  . Hypocalcemia   . Acute on chronic renal failure ([employer]) 06/20/2016  . Abnormal EKG 06/20/2016  . Vomiting 06/20/2016  . Respiratory failure ([place]) 05/25/2016  . Respiratory distress   . Multiple myeloma in relapse ([place]) 05/01/2016  . Influenza B 04/02/2016  . Pulmonary edema 04/01/2016  . Hyperkalemia 04/01/2016  . Epigastric pain   . Atypical pneumonia 12/26/2015  . Immunocompromised ([place]) 12/26/2015  . Sepsis ([place]) 12/01/2015  . HCAP (healthcare-associated pneumonia)  12/01/2015  . End stage renal disease ([place]) 12/01/2015  . HTN (hypertension) 12/01/2015  . Depression 12/01/2015  . Acute respiratory failure ([place]) 11/14/2015  . Acute renal failure ([place])   . Post-menopausal   . Bulging lumbar disc 05/28/2014    Palliative Care Plan    Recommendations/Plan:   PO or IV Zofran before meals (on d/c please write for zofran at home)  Anticipate d/c to home with continued HD  Patient ontemplating  EOL decisions.  Will need to re-address.   Goals of Care and Additional Recommendations:  Limitations on Scope of Treatment: Full Scope Treatment  Code Status:  Full code  Prognosis:   < 3 months given ESRD on HD and End stage MM.   Discharge Planning:  Home in the care of her family.  Care plan was discussed with case management and the patient.  Thank you for allowing the Palliative Medicine Team to assist in the care of this patient.  Total time spent:  25.min.     Greater than 50%  of this time was spent counseling and coordinating care related to the above assessment and plan.  Florentina Jenny, PA-C Palliative Medicine  Please contact Palliative MedicineTeam phone at [phone] for questions and concerns between 7 am - 7 pm.   Please see AMION for individual provider pager numbers.

## 2016-11-18 NOTE — Care Management (Signed)
Marlena - patient's daughter - I point of contact to discuss  medicaid.  [facility] is assessing for charity care assist

## 2016-11-18 NOTE — Progress Notes (Signed)
[facility]  ROUNDING NOTE   Subjective:   Patient seen during dialysis Tolerating well   HEMODIALYSIS FLOWSHEET:  Blood Flow Rate (mL/min): 300 mL/min Arterial Pressure (mmHg): -160 mmHg Venous Pressure (mmHg): 110 mmHg Transmembrane Pressure (mmHg): 70 mmHg Ultrafiltration Rate (mL/min): 910 mL/min Dialysate Flow Rate (mL/min): 600 ml/min Conductivity: Machine : 14 Conductivity: Machine : 14 Dialysis Fluid Bolus: Normal Saline Bolus Amount (mL): 250 mL Dialysate Change:  (3k)     Objective:  Vital signs in last 24 hours:  Temp:  [97.8 F (36.6 C)-98.5 F (36.9 C)] 97.8 F (36.6 C) (09/13 1245) Pulse Rate:  [67-99] 93 (09/13 1245) Resp:  [14-29] 17 (09/13 1245) BP: (112-159)/(46-59) 127/56 (09/13 1245) SpO2:  [93 %-100 %] 98 % (09/13 1245) Weight:  [50.8 kg (112 lb)] 50.8 kg (112 lb) (09/12 1520)  Weight change:  Filed Weights   11/10/16 1258 11/11/16 1712 11/14/2016 1520  Weight: 50.9 kg (112 lb 3.4 oz) 50.9 kg (112 lb 3.4 oz) 50.8 kg (112 lb)    Intake/Output: I/O last 3 completed shifts: In: 120 [P.O.:60; I.V.:10; IV Piggyback:50] Out: 0    Intake/Output this shift:  Total I/O In: -  Out: 1300 [Other:1300]  Physical Exam: General: Somewhat improved, laying in bed  Head:  Moist oral mucosal membranes  Eyes: Anicteric  Neck: Right IJ port  Lungs:  Clear to auscultation bilaterally, normal respiratory effort  Heart: S1S2 no rubs  Abdomen:  Soft, nontender  Extremities: No peripheral edema.  Neurologic: Resting quietly  Skin: No lesions  Access: LUE AVF, venous clotted when attempting hemodialysis    Basic Metabolic Panel:  Recent Labs Lab 11/14/16 0538 11/15/16 0435 11/15/16 1149 11/26/2016 0454 11/18/16 0409  NA 134* 138 133* 131* 128*  K 4.5 4.6 4.5 4.5 4.4  CL 93* 95* 93* 90* 89*  CO2 28 30 28 28 26   GLUCOSE 99 100* 202* 111* 143*  BUN 40* 23* 28* 30* 42*  CREATININE 4.35* 3.15* 3.93* 3.83* 5.17*  CALCIUM 11.4* 11.3*  11.3* 12.1* 11.7*  PHOS  --   --  5.8*  --  5.6*    Liver Function Tests:  Recent Labs Lab 11/15/16 1149 11/18/16 0409  AST 22  --   ALT 11*  --   ALKPHOS 102  --   BILITOT 0.6  --   PROT 5.2*  --   ALBUMIN 2.4*  2.6* 2.2*   No results for input(s): LIPASE, AMYLASE in the last 168 hours. No results for input(s): AMMONIA in the last 168 hours.  CBC:  Recent Labs Lab 11/15/16 0435 11/15/16 1149 11/16/16 0600 11/16/16 1824 11/10/2016 0454 11/18/16 0409  WBC 2.9* 2.2* 2.7*  --  3.9 5.0  NEUTROABS 2.4  --  1.9  --   --   --   HGB 7.1* 6.2* 5.9* 7.7* 8.3* 8.5*  HCT 21.1* 17.9* 17.4* 22.4* 24.0* 24.2*  MCV 88.9 89.7 87.7  --  88.8 89.6  PLT 33* 29* 20*  --  20* 20*    Cardiac Enzymes: No results for input(s): CKTOTAL, CKMB, CKMBINDEX, TROPONINI in the last 168 hours.  BNP: Invalid input(s): POCBNP  CBG:  Recent Labs Lab 11/07/2016 0709 11/23/2016 1636 11/13/2016 1706 11/24/2016 2043 11/18/16 0753  GLUCAP 106* 119* 107* 99 111*    Microbiology: Results for orders placed or performed during the hospital encounter of 11/16/2016  MRSA PCR Screening     Status: None   Collection Time: 11/14/2016  3:41 AM  Result Value Ref  Range Status   MRSA by PCR NEGATIVE NEGATIVE Final    Comment:        The GeneXpert MRSA Assay (FDA approved for NASAL specimens only), is one component of a comprehensive MRSA colonization surveillance program. It is not intended to diagnose MRSA infection nor to guide or monitor treatment for MRSA infections.   Urine Culture     Status: Abnormal   Collection Time: 11/10/16  2:19 AM  Result Value Ref Range Status   Specimen Description URINE, RANDOM  Final   Special Requests NONE  Final   Culture MULTIPLE SPECIES PRESENT, SUGGEST RECOLLECTION (A)  Final   Report Status 11/11/2016 FINAL  Final  CULTURE, BLOOD (ROUTINE X 2) w Reflex to ID Panel     Status: None (Preliminary result)   Collection Time: 11/14/16  8:26 AM  Result Value Ref Range  Status   Specimen Description BLOOD RIGHT ANTECUBITAL  Final   Special Requests   Final    BOTTLES DRAWN AEROBIC AND ANAEROBIC Blood Culture adequate volume   Culture NO GROWTH 4 DAYS  Final   Report Status PENDING  Incomplete  CULTURE, BLOOD (ROUTINE X 2) w Reflex to ID Panel     Status: None (Preliminary result)   Collection Time: 11/14/16  8:38 AM  Result Value Ref Range Status   Specimen Description BLOOD BLOOD RIGHT HAND  Final   Special Requests   Final    BOTTLES DRAWN AEROBIC AND ANAEROBIC Blood Culture adequate volume   Culture NO GROWTH 4 DAYS  Final   Report Status PENDING  Incomplete    Coagulation Studies: No results for input(s): LABPROT, INR in the last 72 hours.  Urinalysis: No results for input(s): COLORURINE, LABSPEC, PHURINE, GLUCOSEU, HGBUR, BILIRUBINUR, KETONESUR, PROTEINUR, UROBILINOGEN, NITRITE, LEUKOCYTESUR in the last 72 hours.  Invalid input(s): APPERANCEUR    Imaging: No results found.   Medications:   . sodium chloride    . albumin human    . piperacillin-tazobactam (ZOSYN)  IV Stopped (11/18/16 0149)  . [START ON 11/19/2016] vancomycin 500 mg (11/18/16 1154)  . vancomycin     . aspirin EC  81 mg Oral Daily  . cinacalcet  60 mg Oral Q supper  . cloNIDine  0.1 mg Oral BID  . epoetin (EPOGEN/PROCRIT) injection  10,000 Units Intravenous Q M,W,F-HD  . fentaNYL  25 mcg Transdermal Q72H  . hydrALAZINE  25 mg Oral Q8H  . insulin aspart  0-15 Units Subcutaneous TID WC & HS  . irbesartan  300 mg Oral QHS  . levETIRAcetam  500 mg Oral Daily   And  . levETIRAcetam  250 mg Oral Once per day on Mon Wed Fri  . mouth rinse  15 mL Mouth Rinse BID  . metoprolol tartrate  25 mg Oral BID  . MUSCLE RUB   Topical BID  . ondansetron (ZOFRAN) IV  4 mg Intravenous TID AC  . ondansetron  4 mg Oral Once  . senna  1 tablet Oral Daily  . sodium chloride flush  10-40 mL Intracatheter Q12H  . verapamil  120 mg Oral Daily   acetaminophen **OR** acetaminophen,  albuterol, labetalol, LORazepam, ondansetron **OR** ondansetron (ZOFRAN) IV, oxyCODONE, prochlorperazine, sodium chloride flush  Assessment/ Plan:  51 y.o.Hispanic Spanish Speaking only female with diabetes mellitus type II, hypertension, multiple myeloma, ESRD on hemodialysis.   [facility] M[facility] [address] AV[facility]  1. End Stage Renal Disease: ESRD secondary to multiple myeloma.  - successful angioplasty done yesterday. - Patient is  able to continue dialysis today. 16-gauge needles.  Send blood for  2. Anemia of chronic kidney disease with multiple myeloma:  - Hemoglobin improved to 8.5 with blood transfusion his admission, continues to have low platelets - EPO with HD treatment continue  3. Hypertension with posterior reversible encephalopathy syndrome (PRES) on MRI Nicardipine gtt on admission.  - Home regimen of verapamil.  - Started irbesartan on this admission.   4. Secondary Hyperparathyroidism:  Calcium elevated at 11.9 on admission. -Calcium continues to be elevated,11.7 today -Secondary to her underlying multiple myeloma. - Phosphorus elevated 9/10 at 5.6, will start binders when pt is able to eat a normal diet  5. Fever  - Receiving Zosyn and vancomycin.  - Fever workup in progress. - normal temperature today   LOS: 10 Tore Carreker 9/13/201812:55 PM

## 2016-11-18 NOTE — Progress Notes (Signed)
Hd start, alert, no c/o, stable 16g needles, bfr 300, language line used

## 2016-11-18 NOTE — Care Management (Addendum)
Spoke with patient's daughter Hulen Skains regarding medicaid status and she confirms that patient has not met citizenship criteria to qualify for straight medicaid.  Aleene Davidson with financial services will look into another emergency medicaid application for this stay but coverage will be limited.  Informed  that patient has been approved for SN SW services through [facility]. Have requested hospital bed.  Found that [facility] have some metal ramps for house with 2-3 steps. Will investigate and will contact [facility] if this meets patient's needs. Reaching out to determine if members of care team feel palliative follow up at home would be of benefit.

## 2016-11-18 NOTE — Progress Notes (Signed)
PT Cancellation Note  Patient Details Name: Jamie Keller MRN: [id number] DOB: [birth year]   Cancelled Treatment:    Reason Eval/Treat Not Completed: Patient at procedure or test/unavailable   Session attempted, at dialysis.  Will re-attempt this pm as time allows.   Chesley Noon 11/18/2016, 12:00 PM

## 2016-11-18 NOTE — Progress Notes (Signed)
Received report from Susan,RN. Patient AAOx3, SR on the monitor. Zoysn infusing to chest port. Patent denies the need for pain medication at this time. Daughter at bedside. Susan,RN aware of blood documentation needing to be completed.

## 2016-11-18 NOTE — Progress Notes (Addendum)
Interpreter Sherre Scarlet was called for pt during assessment, went over plan of care with pt and any questions or concerns were answered.

## 2016-11-18 NOTE — Care Management (Signed)
Spoke with Jamie Keller at [facility] regarding medicaid application.  The application that was requested in March was for emergency dialysis.  She has been in communication "with someone at [facility] regarding this."  CM does not know at present whether patient meets the requirements of medicaid - such as citizenship.  There is a [employer] manager number present on this account.  CM left a message for financial counselor- Faythe Dingwall lambert to call CM back to discuss.  SM will speak with patient and or family to determine if patient is eligible for medicaid.  If not, will again provide the application for [facility] which have been provided in the past but not completed.  Have left message for Elease Etienne- Service Navigator at the [facility]

## 2016-11-19 LAB — CBC WITH DIFFERENTIAL/PLATELET
BAND NEUTROPHILS: 1 %
BASOS PCT: 0 %
Basophils Absolute: 0 10*3/uL (ref 0–0.1)
Blasts: 0 %
EOS ABS: 0 10*3/uL (ref 0–0.7)
EOS PCT: 0 %
HCT: 22.6 % — ABNORMAL LOW (ref 35.0–47.0)
HEMOGLOBIN: 7.8 g/dL — AB (ref 12.0–16.0)
LYMPHS ABS: 0.7 10*3/uL — AB (ref 1.0–3.6)
LYMPHS PCT: 12 %
MCH: 31.3 pg (ref 26.0–34.0)
MCHC: 34.7 g/dL (ref 32.0–36.0)
MCV: 90.3 fL (ref 80.0–100.0)
MONO ABS: 0.2 10*3/uL (ref 0.2–0.9)
Metamyelocytes Relative: 2 %
Monocytes Relative: 4 %
Myelocytes: 1 %
NEUTROS PCT: 80 %
NRBC: 1 /100{WBCs} — AB
Neutro Abs: 5 10*3/uL (ref 1.4–6.5)
OTHER: 0 %
PLATELETS: 17 10*3/uL — AB (ref 150–440)
PROMYELOCYTES ABS: 0 %
RBC: 2.5 MIL/uL — ABNORMAL LOW (ref 3.80–5.20)
RDW: 15.3 % — AB (ref 11.5–14.5)
WBC: 5.9 10*3/uL (ref 3.6–11.0)

## 2016-11-19 LAB — RENAL FUNCTION PANEL
ANION GAP: 14 (ref 5–15)
Albumin: 2.3 g/dL — ABNORMAL LOW (ref 3.5–5.0)
BUN: 32 mg/dL — ABNORMAL HIGH (ref 6–20)
CALCIUM: 13.3 mg/dL — AB (ref 8.9–10.3)
CHLORIDE: 89 mmol/L — AB (ref 101–111)
CO2: 28 mmol/L (ref 22–32)
CREATININE: 4.39 mg/dL — AB (ref 0.44–1.00)
GFR calc Af Amer: 12 mL/min — ABNORMAL LOW (ref >60)
GFR calc non Af Amer: 11 mL/min — ABNORMAL LOW (ref >60)
GLUCOSE: 128 mg/dL — AB (ref 65–99)
Phosphorus: 5.7 mg/dL — ABNORMAL HIGH (ref 2.5–4.6)
Potassium: 4.4 mmol/L (ref 3.5–5.1)
SODIUM: 131 mmol/L — AB (ref 135–145)

## 2016-11-19 LAB — BPAM RBC
BLOOD PRODUCT EXPIRATION DATE: 201810092359
BLOOD PRODUCT EXPIRATION DATE: 201810092359
ISSUE DATE / TIME: 201809111219
UNIT TYPE AND RH: 5100
UNIT TYPE AND RH: 7300

## 2016-11-19 LAB — TYPE AND SCREEN
ABO/RH(D): B POS
Antibody Screen: POSITIVE
UNIT DIVISION: 0
Unit division: 0

## 2016-11-19 LAB — HEPATIC FUNCTION PANEL
ALK PHOS: 134 U/L — AB (ref 38–126)
ALT: 11 U/L — AB (ref 14–54)
AST: 22 U/L (ref 15–41)
Albumin: 2.4 g/dL — ABNORMAL LOW (ref 3.5–5.0)
BILIRUBIN DIRECT: 0.1 mg/dL (ref 0.1–0.5)
BILIRUBIN INDIRECT: 0.9 mg/dL (ref 0.3–0.9)
Total Bilirubin: 1 mg/dL (ref 0.3–1.2)
Total Protein: 5.7 g/dL — ABNORMAL LOW (ref 6.5–8.1)

## 2016-11-19 LAB — GLUCOSE, CAPILLARY
GLUCOSE-CAPILLARY: 172 mg/dL — AB (ref 65–99)
GLUCOSE-CAPILLARY: 108 mg/dL — AB (ref 65–99)

## 2016-11-19 LAB — CULTURE, BLOOD (ROUTINE X 2)
CULTURE: NO GROWTH
Culture: NO GROWTH
SPECIAL REQUESTS: ADEQUATE
Special Requests: ADEQUATE

## 2016-11-19 LAB — LIPASE, BLOOD: Lipase: 57 U/L — ABNORMAL HIGH (ref 11–51)

## 2016-11-19 LAB — VANCOMYCIN, RANDOM: VANCOMYCIN RM: 9

## 2016-11-19 MED ORDER — SODIUM CHLORIDE 0.9 % IV SOLN: Freq: Once | INTRAVENOUS | Status: DC

## 2016-11-19 NOTE — Progress Notes (Signed)
PT Cancellation Note  Patient Details Name: Jamie Keller MRN: [id number] DOB: 1965-03-16   Cancelled Treatment:    Reason Eval/Treat Not Completed: Patient at procedure or test/unavailable. Currently out of room for HD. Will re-attempt, time permitting.   Mazi Schuff 11/19/2016, 11:29 AM Greggory Stallion, PT, DPT [phone]

## 2016-11-19 NOTE — Progress Notes (Signed)
HD COMPLETED  

## 2016-11-19 NOTE — Progress Notes (Signed)
[facility]  ROUNDING NOTE   Subjective:      HEMODIALYSIS FLOWSHEET:  Blood Flow Rate (mL/min): 350 mL/min Arterial Pressure (mmHg): -210 mmHg Venous Pressure (mmHg): 160 mmHg Transmembrane Pressure (mmHg): 70 mmHg Ultrafiltration Rate (mL/min): 670 mL/min Dialysate Flow Rate (mL/min): 600 ml/min Conductivity: Machine : 14 Conductivity: Machine : 14 Dialysis Fluid Bolus: Normal Saline Bolus Amount (mL): 250 mL Dialysate Change:  (3k)  Patient seen during dialysis.  Tolerating well.  No acute complaints or events reported by nursing.   Objective:  Vital signs in last 24 hours:  Temp:  [97.8 F (36.6 C)-98.4 F (36.9 C)] 98 F (36.7 C) (09/14 1045) Pulse Rate:  [70-93] 83 (09/14 1230) Resp:  [13-20] 14 (09/14 1230) BP: (126-176)/(44-61) 146/55 (09/14 1230) SpO2:  [96 %-99 %] 99 % (09/14 0800) Weight:  [55.6 kg (122 lb 9.2 oz)] 55.6 kg (122 lb 9.2 oz) (09/14 1100)  Weight change:  Filed Weights   11/11/16 1712 11/07/2016 1520 11/19/16 1100  Weight: 50.9 kg (112 lb 3.4 oz) 50.8 kg (112 lb) 55.6 kg (122 lb 9.2 oz)    Intake/Output: I/O last 3 completed shifts: In: 64 [I.V.:10; IV Piggyback:50] Out: 1600 [Urine:300; Other:1300]   Intake/Output this shift:  No intake/output data recorded.  Physical Exam: General: laying in bed  Head:  Moist oral mucosal membranes  Eyes: Anicteric  Neck: Right IJ port  Lungs:  Clear to auscultation bilaterally, normal respiratory effort  Heart: S1S2 no rubs  Abdomen:  Soft, nontender  Extremities: No peripheral edema.  Neurologic: Resting quietly  Skin: No lesions  Access: LUE AVF,     Basic Metabolic Panel:  Recent Labs Lab 11/15/16 0435 11/15/16 1149 11/16/2016 0454 11/18/16 0409 11/19/16 1040  NA 138 133* 131* 128* 131*  K 4.6 4.5 4.5 4.4 4.4  CL 95* 93* 90* 89* 89*  CO2 _0 GLUCOSE 100* 202* 111* 143* 128*  BUN 23* 28* 30* 42* 32*  CREATININE 3.15* 3.93* 3.83* 5.17* 4.39*  CALCIUM  11.3* 11.3* 12.1* 11.7* 13.3*  PHOS  --  5.8*  --  5.6* 5.7*    Liver Function Tests:  Recent Labs Lab 11/15/16 1149 11/18/16 0409 11/19/16 1040  AST 22  --   --   ALT 11*  --   --   ALKPHOS 102  --   --   BILITOT 0.6  --   --   PROT 5.2*  --   --   ALBUMIN 2.4*  2.6* 2.2* 2.3*   No results for input(s): LIPASE, AMYLASE in the last 168 hours. No results for input(s): AMMONIA in the last 168 hours.  CBC:  Recent Labs Lab 11/15/16 0435 11/15/16 1149 11/16/16 0600 11/16/16 1824 11/16/2016 0454 11/18/16 0409 11/19/16 1040  WBC 2.9* 2.2* 2.7*  --  3.9 5.0 5.9  NEUTROABS 2.4  --  1.9  --   --   --  PENDING  HGB 7.1* 6.2* 5.9* 7.7* 8.3* 8.5* 7.8*  HCT 21.1* 17.9* 17.4* 22.4* 24.0* 24.2* 22.6*  MCV 88.9 89.7 87.7  --  88.8 89.6 90.3  PLT 33* 29* 20*  --  20* 20* PENDING    Cardiac Enzymes: No results for input(s): CKTOTAL, CKMB, CKMBINDEX, TROPONINI in the last 168 hours.  BNP: Invalid input(s): POCBNP  CBG:  Recent Labs Lab 11/19/2016 2043 11/18/16 0753 11/18/16 1346 11/18/16 1806 11/18/16 2107  GLUCAP 99 111* 114* 179* 116*    Microbiology: Results for orders placed or performed  during the hospital encounter of 11/30/2016  MRSA PCR Screening     Status: None   Collection Time: 11/12/2016  3:41 AM  Result Value Ref Range Status   MRSA by PCR NEGATIVE NEGATIVE Final    Comment:        The GeneXpert MRSA Assay (FDA approved for NASAL specimens only), is one component of a comprehensive MRSA colonization surveillance program. It is not intended to diagnose MRSA infection nor to guide or monitor treatment for MRSA infections.   Urine Culture     Status: Abnormal   Collection Time: 11/10/16  2:19 AM  Result Value Ref Range Status   Specimen Description URINE, RANDOM  Final   Special Requests NONE  Final   Culture MULTIPLE SPECIES PRESENT, SUGGEST RECOLLECTION (A)  Final   Report Status 11/11/2016 FINAL  Final  CULTURE, BLOOD (ROUTINE X 2) w Reflex to ID  Panel     Status: None   Collection Time: 11/14/16  8:26 AM  Result Value Ref Range Status   Specimen Description BLOOD RIGHT ANTECUBITAL  Final   Special Requests   Final    BOTTLES DRAWN AEROBIC AND ANAEROBIC Blood Culture adequate volume   Culture NO GROWTH 5 DAYS  Final   Report Status 11/19/2016 FINAL  Final  CULTURE, BLOOD (ROUTINE X 2) w Reflex to ID Panel     Status: None   Collection Time: 11/14/16  8:38 AM  Result Value Ref Range Status   Specimen Description BLOOD BLOOD RIGHT HAND  Final   Special Requests   Final    BOTTLES DRAWN AEROBIC AND ANAEROBIC Blood Culture adequate volume   Culture NO GROWTH 5 DAYS  Final   Report Status 11/19/2016 FINAL  Final    Coagulation Studies: No results for input(s): LABPROT, INR in the last 72 hours.  Urinalysis: No results for input(s): COLORURINE, LABSPEC, PHURINE, GLUCOSEU, HGBUR, BILIRUBINUR, KETONESUR, PROTEINUR, UROBILINOGEN, NITRITE, LEUKOCYTESUR in the last 72 hours.  Invalid input(s): APPERANCEUR    Imaging: No results found.   Medications:   . sodium chloride    . piperacillin-tazobactam (ZOSYN)  IV Stopped (11/19/16 0109)  . vancomycin Stopped (11/18/16 1347)   . aspirin EC  81 mg Oral Daily  . cinacalcet  60 mg Oral Q supper  . cloNIDine  0.1 mg Oral BID  . epoetin (EPOGEN/PROCRIT) injection  10,000 Units Intravenous Q M,W,F-HD  . fentaNYL  25 mcg Transdermal Q72H  . hydrALAZINE  25 mg Oral Q8H  . insulin aspart  0-15 Units Subcutaneous TID WC & HS  . irbesartan  300 mg Oral QHS  . levETIRAcetam  500 mg Oral Daily   And  . levETIRAcetam  250 mg Oral Once per day on Mon Wed Fri  . mouth rinse  15 mL Mouth Rinse BID  . metoprolol tartrate  25 mg Oral BID  . MUSCLE RUB   Topical BID  . ondansetron (ZOFRAN) IV  4 mg Intravenous TID AC  . ondansetron  4 mg Oral Once  . sodium chloride flush  10-40 mL Intracatheter Q12H  . verapamil  120 mg Oral Daily   acetaminophen **OR** acetaminophen, albuterol,  labetalol, LORazepam, ondansetron **OR** ondansetron (ZOFRAN) IV, oxyCODONE, prochlorperazine, sodium chloride flush  Assessment/ Plan:  51 y.o.Hispanic Spanish Speaking only female with diabetes mellitus type II, hypertension, multiple myeloma, ESRD on hemodialysis.   [facility] M[facility] [address] [facility]  1. End Stage Renal Disease: ESRD secondary to multiple myeloma.  - successful angioplasty ff AV  access done on Wednesday. - Patient seen during dialysis Tolerating well  - BFR 350 with 16G  2. Anemia of chronic kidney disease with multiple myeloma:  - Hemoglobin improved to 7.8 with blood transfusion this admission, continues to have low platelets - EPO with HD treatment continued  3. Secondary Hyperparathyroidism:  Calcium elevated at 11.9 on admission. -Calcium continues to be elevated,13.3 today -Secondary to her underlying multiple myeloma. - Phosphorus elevated 9/10 at 5.7 will start binders when pt is able to eat a normal diet  4. Fever  - Receiving Zosyn and vancomycin.  - Fever workup in progress. - normal temperature today   LOS: 11 Jamie Keller 9/14/201812:34 PM

## 2016-11-19 NOTE — Progress Notes (Addendum)
New referral for out patient Palliative to follow at home received from [facility]. Patient information faxed to referral. Possible discharge home tomorrow. Thank you. Flo Shanks RN, BSN, [facility] [place], hospital Liaison [phone] c

## 2016-11-19 NOTE — Progress Notes (Signed)
PRE DIALYSIS ASSESSMENT 

## 2016-11-19 NOTE — Progress Notes (Signed)
 [facility] - [place] [facility]   PATIENT NAME: Jamie Keller    MR#:  [id number]  DATE OF BIRTH:  04/17/1965    CHIEF COMbloINT:   Chief Complaint  Patient presents with  . Seizures  Patient is very weak - could not have HD today as her fistula is clotted, sleepy.  REVIEW OF SYSTEMS:  Review of Systems  Constitutional: Positive for fever. Negative for chills.  HENT: Negative for hearing loss.   Eyes: Negative for blurred vision, double vision and photophobia.  Respiratory: Negative for cough, hemoptysis and shortness of breath.   Cardiovascular: Negative for palpitations, orthopnea and leg swelling.  Gastrointestinal: Negative for abdominal pain, diarrhea and vomiting.  Genitourinary: Negative for dysuria and urgency.  Musculoskeletal: Positive for joint pain. Negative for myalgias and neck pain.  Skin: Negative for rash.  Neurological: Negative for dizziness, focal weakness, seizures, weakness and headaches.  Psychiatric/Behavioral: Negative for memory loss. The patient does not have insomnia.     DRUG ALLERGIES:  No Known Allergies VITALS:  Blood pressure (!) 160/54, pulse 90, temperature 98 F (36.7 C), temperature source Oral, resp. rate 18, height 5' (1.524 m), weight 52.7 kg (116 lb 2.9 oz), last menstrual period 02/25/1995, SpO2 95 %. PHYSICAL EXAMINATION:  Physical Exam  Constitutional:  Alert, awake, oriented.  HENT:  Head: Normocephalic.  Mouth/Throat: Oropharynx is clear and moist.  Eyes: No scleral icterus.  Neck: Neck supple. No JVD present. No tracheal deviation present.  Cardiovascular: Normal rate, regular rhythm and normal heart sounds.  Exam reveals no gallop.   No murmur heard. Pulmonary/Chest: Effort normal and breath sounds normal. No respiratory distress. She has no wheezes. She has no rales.  Abdominal: Soft. Bowel sounds are normal. She exhibits no distension. There is no tenderness. There is no rebound.    Musculoskeletal: She exhibits no edema or tenderness.  Bilateral knee tenderness is present  Neurological: No cranial nerve deficit.  .  Skin: No rash noted. No erythema.   LABORATORY PANEL:  Female CBC  Recent Labs Lab 11/19/16 1040  WBC 5.9  HGB 7.8*  HCT 22.6*  PLT 17*   ------------------------------------------------------------------------------------------------------------------ Chemistries   Recent Labs Lab 11/15/16 1149  11/19/16 1040  NA 133*  < > 131*  K 4.5  < > 4.4  CL 93*  < > 89*  CO2 28  < > 28  GLUCOSE 202*  < > 128*  BUN 28*  < > 32*  CREATININE 3.93*  < > 4.39*  CALCIUM 11.3*  < > 13.3*  AST 22  --   --   ALT 11*  --   --   ALKPHOS 102  --   --   BILITOT 0.6  --   --   < > = values in this interval not displayed. RADIOLOGY:  No results found. ASSESSMENT AND PLAN:   IMPRESSION AND PLAN: 51-year-old female patient with history of multiple myeloma, end-stage renal disease on dialysis, hypertension presented to the emergency room with seizure and elevated blood pressure.   #fever-etiology unclear Pancultures ordered,blood cultures are negative so far, urine cultures are pending Broad-spectrum IV antibiotics IV Zosyn and vancomycin started 11/14/2016 Chest x-ray with pulmonary edema Infectious Disease recommendations appreciated   Suggested to continue IV abx one more day.   Had c/o diarrhea, but stool is now soft to formed, no need to check C diff.  # nausea and vomit once on 11/19/16    Check lipase and LFTs.  #History   of multiple myeloma with lytic bony lesions-pancytopenia Patient is complaining bilateral knee pain, could be from underlying multiple myeloma will get x-rays Discussed with Dr. Grayland Ormond who reports that her prognosis is poor with no good treatment options and recommended palliative care consult , palliative care consult is placed  Patient is on fentanyl patch for the pain control. Patient has multiple antibodies but okay to  transfuse blood per Dr. Grayland Ormond Monitor CBC and including platelet count  # thrombocytopenia    Transfusion on 11/19/16  #. Hypertensive urgency Clinically improving Continue metoprolol 25 mg , verapamil 120 mg and hydralazine 25 mg every 8 hours. Clonidine is added to the regimen for better control of the blood pressure, titrate as needed. Goal systolic blood pressure less than 160  #. Posterior reversible encephalopathy syndrome;MRI of the brain did not show any acute stroke. Showed only PRES>patient is off nicardipine drip. Transferred out of ICU. Patient had respiratory distress and shortness of breath and received emergency hemodialysis yesterday and she will get another hemodialysis today. Blood pressure is still high so increased the dose of metoprolol to 25 mg twice a day, added hydralazine 25 mg 3 times a day.clonidine is added to the regimen  #.New onset seizure; by mouth Keppra.  Renally dose the Keppra.  No other episodes of seizures noticed  # End-stage renal disease on dialysis,nephro is following Patient's shortness of breath improved after 3 consecutive days of hemodialysis Chest x-ray with pulmonary edema on 11/14/2016 had hemodialysis  Calcium level is high at 11.4,Sensipar dose increased to 60 mg   # clotted HD fistula   Nephro consulted vascular for procedure for declotting.   Declotting done, HD done   Prognosis really poor and high risk for cardiac arrest.. . All the records are reviewed and case discussed with Care Management/Social Worker. Management plans discussed with the patient, her daughter , and explained patient's poor prognosis with declining clinical status .he is agreeable with palliative care consult   CODE STATUS: Full Code  TOTAL TIME TAKING CARE OF THIS PATIENT: 35 minutes.   More than 50% of the time was spent in counseling/coordination of care: YES  POSSIBLE D/C IN 3 DAYS, DEPENDING ON CLINICAL CONDITION.   Vaughan Basta M.D on  11/19/2016 at 5:35 PM  Between 7am to 6pm - Pager - [phone]  After 6pm go to [email or web address] - [facility]

## 2016-11-19 NOTE — Progress Notes (Signed)
HD STARTED  

## 2016-11-19 NOTE — Care Management (Addendum)
Anticipate discharge 9/15 if gi symptoms under control.  Spoke with daughter Hulen Skains.  Reviewed plan for [facility]: RN and SW. Agreeable to have palliative follow also. (Notified palliative).  Obtaining order for hospital bed.  Discussed CM looking into ramps but need to know the number of steps and the height of each step. Identifed to palliative and [facility] that Hulen Skains is the point of contact for all visits and plans.  Provided with another application for [facility] [place] [facility] Patient suffers from multiple myeloma with lytic bony lesions causing severe overall pain and shortness of breath .  Bed wedges do not provide enough elevation to resolve breathing issues. Shortness of breath and pain cause patient to require frequent changes in body position which cannot be achieved with a normal bed.

## 2016-11-19 NOTE — Progress Notes (Signed)
POST DIALYSIS ASSESSMENT 

## 2016-11-19 NOTE — Consult Note (Signed)
Pharmacy Antibiotic Note  Jamie Keller is a 51 y.o. female admitted on 11/17/2016 with neutropenic fevers.  Pharmacy has been consulted for vancomycin/zosyn dosing. Patient is an ESRD pt on HD MWF  Plan: Continue zosyn 3.375g q 12hr Vancomycin 500mg  q HD Trough Monday prior to HD  Height: 5' (152.4 cm) Weight: 122 lb 9.2 oz (55.6 kg) IBW/kg (Calculated) : 45.5  Temp (24hrs), Avg:98.1 F (36.7 C), Min:97.8 F (36.6 C), Max:98.4 F (36.9 C)   Recent Labs Lab 11/15/16 0435 11/15/16 1149 11/16/16 0600 11/12/2016 0454 11/18/16 0409 11/19/16 1040  WBC 2.9* 2.2* 2.7* 3.9 5.0 5.9  CREATININE 3.15* 3.93*  --  3.83* 5.17* 4.39*  VANCORANDOM  --   --   --  12  --   --     Estimated Creatinine Clearance: 11.7 mL/min (A) (by C-G formula based on SCr of 4.39 mg/dL (H)).    No Known Allergies  Antimicrobials this admission: 9/9 vancomycin >>  9/9 zosyn >>   Dose adjustments this admission:   Microbiology results: 9/9 BCx: NG 5 days 9/9 UCx: mult species  9/3 MRSA PCR: neg afebrile  Thank you for allowing pharmacy to be a part of this patient's care.  Ramond Dial, Pharm.D, BCPS Clinical Pharmacist  11/19/2016 1:30 PM

## 2016-11-20 LAB — GASTROINTESTINAL PANEL BY PCR, STOOL (REPLACES STOOL CULTURE)
ADENOVIRUS F40/41: NOT DETECTED
Astrovirus: NOT DETECTED
CRYPTOSPORIDIUM: NOT DETECTED
CYCLOSPORA CAYETANENSIS: NOT DETECTED
Campylobacter species: NOT DETECTED
ENTAMOEBA HISTOLYTICA: NOT DETECTED
ENTEROAGGREGATIVE E COLI (EAEC): NOT DETECTED
ENTEROPATHOGENIC E COLI (EPEC): DETECTED — AB
Enterotoxigenic E coli (ETEC): NOT DETECTED
GIARDIA LAMBLIA: NOT DETECTED
Norovirus GI/GII: NOT DETECTED
Plesimonas shigelloides: NOT DETECTED
Rotavirus A: NOT DETECTED
Salmonella species: NOT DETECTED
Sapovirus (I, II, IV, and V): NOT DETECTED
Shiga like toxin producing E coli (STEC): NOT DETECTED
Shigella/Enteroinvasive E coli (EIEC): NOT DETECTED
VIBRIO CHOLERAE: NOT DETECTED
VIBRIO SPECIES: NOT DETECTED
YERSINIA ENTEROCOLITICA: NOT DETECTED

## 2016-11-20 LAB — PREPARE PLATELET PHERESIS: Unit division: 0

## 2016-11-20 LAB — GLUCOSE, CAPILLARY
GLUCOSE-CAPILLARY: 165 mg/dL — AB (ref 65–99)
GLUCOSE-CAPILLARY: 126 mg/dL — AB (ref 65–99)
Glucose-Capillary: 125 mg/dL — ABNORMAL HIGH (ref 65–99)
Glucose-Capillary: 136 mg/dL — ABNORMAL HIGH (ref 65–99)

## 2016-11-20 LAB — BASIC METABOLIC PANEL
ANION GAP: 14 (ref 5–15)
BUN: 16 mg/dL (ref 6–20)
CHLORIDE: 89 mmol/L — AB (ref 101–111)
CO2: 29 mmol/L (ref 22–32)
Calcium: 13.3 mg/dL (ref 8.9–10.3)
Creatinine, Ser: 2.67 mg/dL — ABNORMAL HIGH (ref 0.44–1.00)
GFR calc non Af Amer: 19 mL/min — ABNORMAL LOW (ref >60)
GFR, EST AFRICAN AMERICAN: 22 mL/min — AB (ref >60)
Glucose, Bld: 137 mg/dL — ABNORMAL HIGH (ref 65–99)
Potassium: 4.4 mmol/L (ref 3.5–5.1)
Sodium: 132 mmol/L — ABNORMAL LOW (ref 135–145)

## 2016-11-20 LAB — BPAM PLATELET PHERESIS
BLOOD PRODUCT EXPIRATION DATE: 201809172359
ISSUE DATE / TIME: 201809142031
Unit Type and Rh: 1700

## 2016-11-20 LAB — C DIFFICILE QUICK SCREEN W PCR REFLEX
C Diff antigen: NEGATIVE
C Diff interpretation: NOT DETECTED
C Diff toxin: NEGATIVE

## 2016-11-20 NOTE — Progress Notes (Signed)
Physical Therapy Treatment Patient Details Name: Jamie Keller MRN: [id number] DOB: [birth year] Today's Date: 11/20/2016    History of Present Illness Jamie Keller  is a 51 y.o. female with a known history of anemia, end-stage renal disease on dialysis, multiple myeloma, diabetes mellitus, neuropathy, osteoarthritis presented to the emergency room from home, after family witnessed seizure. Patient had 2 episodes of seizures at home. She also had vomiting. EMS was called for the seizures and blood pressure was noted to be elevated around systolic to 242 mmHg. Patient was given IV Ativan for seizures. Patient was sedated and lethargic in the ED. She gets dialyzed on Monday Wednesday and Friday. No history of any seizures in the past. In the emergency room, patient's blood pressure was high and she was started on IV nicardipine drip. At time of PT evaluation she is admitted to 2A. Pt is very lethargic and is AOx1 at time of evaluation. History obtained from medical record.     PT Comments    Discussed with primary nurse.  BP 195/71.  OK'ed for limites supine exercises.  Participated in exercises as described below. Bed mobility and attempts at sitting deferred at this time.  Follow Up Recommendations  SNF     Equipment Recommendations  None recommended by PT    Recommendations for Other Services       Precautions / Restrictions Precautions Precautions: Fall Restrictions Weight Bearing Restrictions: No    Mobility  Bed Mobility               General bed mobility comments: deferred due to BP  Transfers                    Ambulation/Gait                 Stairs            Wheelchair Mobility    Modified Rankin (Stroke Patients Only)       Balance                                            Cognition Arousal/Alertness: Awake/alert Behavior During Therapy: Flat affect Overall Cognitive Status: Within Functional  Limits for tasks assessed                                        Exercises Other Exercises Other Exercises: BLE A/AAROM for ankle pumps, heel slides, SLR and ab/add x 10    General Comments        Pertinent Vitals/Pain Pain Assessment: 0-10 Pain Score: 7  Pain Location: "bones" Pain Descriptors / Indicators: Aching Pain Intervention(s): Limited activity within patient's tolerance    Home Living                      Prior Function            PT Goals (current goals can now be found in the care plan section)      Frequency    Min 2X/week      PT Plan Current plan remains appropriate    Co-evaluation              AM-PAC PT "6 Clicks" Daily Activity  Outcome Measure  Difficulty turning over in bed (including adjusting  bedclothes, sheets and blankets)?: Unable Difficulty moving from lying on back to sitting on the side of the bed? : Unable Difficulty sitting down on and standing up from a chair with arms (e.g., wheelchair, bedside commode, etc,.)?: Unable Help needed moving to and from a bed to chair (including a wheelchair)?: Total Help needed walking in hospital room?: Total Help needed climbing 3-5 steps with a railing? : Total 6 Click Score: 6    End of Session   Activity Tolerance: Patient tolerated treatment well Patient left: in bed;with call bell/phone within reach;with bed alarm set;with family/visitor present Nurse Communication: Other (comment) Pain - Right/Left: Right Pain - part of body: Knee     Time: 0821-0830 PT Time Calculation (min) (ACUTE ONLY): 9 min  Charges:  $Therapeutic Exercise: 8-22 mins                    G Codes:       Chesley Noon, PTA 11/20/16, 8:45 AM

## 2016-11-20 NOTE — Progress Notes (Signed)
Pt. Transfused 1 unit of plasma. Tolerated well with no reactions observed. Will continue to monitor pt.

## 2016-11-20 NOTE — Progress Notes (Signed)
Pt. Transferred to room 1, family notified and CCMD made aware.

## 2016-11-20 NOTE — Progress Notes (Signed)
[facility] [place] NAME: Elbert Polyakov    MR#:  [id number]  Hettinger:  1965-04-13  Told the daughter that she is not feeling well today. Patient has mild abdominal pain and soft stools. No fever. Planes of generalized body pains and fatigue.   CHIEF COMbloINT:   Chief Complaint  Patient presents with  . Seizures    REVIEW OF SYSTEMS:  Review of Systems  Constitutional: Negative for chills and fever.  HENT: Negative for hearing loss.   Eyes: Negative for blurred vision, double vision and photophobia.  Respiratory: Negative for cough, hemoptysis and shortness of breath.   Cardiovascular: Negative for palpitations, orthopnea and leg swelling.  Gastrointestinal: Negative for abdominal pain, diarrhea and vomiting.  Genitourinary: Negative for dysuria and urgency.  Musculoskeletal: Positive for joint pain. Negative for myalgias and neck pain.  Skin: Negative for rash.  Neurological: Negative for dizziness, focal weakness, seizures, weakness and headaches.  Psychiatric/Behavioral: Negative for memory loss. The patient does not have insomnia.     DRUG ALLERGIES:  No Known Allergies VITALS:  Blood pressure (!) 198/77, pulse (!) 103, temperature 98.1 F (36.7 C), temperature source Oral, resp. rate 18, height 5' (1.524 m), weight 52.7 kg (116 lb 2.9 oz), last menstrual period 02/25/1995, SpO2 93 %. PHYSICAL EXAMINATION:  Physical Exam  Constitutional:  Alert, awake, oriented.  HENT:  Head: Normocephalic.  Mouth/Throat: Oropharynx is clear and moist.  Eyes: No scleral icterus.  Neck: Neck supple. No JVD present. No tracheal deviation present.  Cardiovascular: Normal rate, regular rhythm and normal heart sounds.  Exam reveals no gallop.   No murmur heard. Pulmonary/Chest: Effort normal and breath sounds normal. No respiratory distress. She has no wheezes. She has no rales.  Abdominal: Soft. Bowel sounds are normal. She  exhibits no distension. There is no tenderness. There is no rebound.  Musculoskeletal: She exhibits no edema or tenderness.  Bilateral knee tenderness is present  Neurological: No cranial nerve deficit.  .  Skin: No rash noted. No erythema.   LABORATORY PANEL:  Female CBC  Recent Labs Lab 11/19/16 1040  WBC 5.9  HGB 7.8*  HCT 22.6*  PLT 17*   ------------------------------------------------------------------------------------------------------------------ Chemistries   Recent Labs Lab 11/19/16 1040 11/20/16 0747  NA 131* 132*  K 4.4 4.4  CL 89* 89*  CO2 28 29  GLUCOSE 128* 137*  BUN 32* 16  CREATININE 4.39* 2.67*  CALCIUM 13.3* 13.3*  AST 22  --   ALT 11*  --   ALKPHOS 134*  --   BILITOT 1.0  --    RADIOLOGY:  No results found. ASSESSMENT AND PLAN:   IMPRESSION AND PLAN: 51 year old female patient with history of multiple myeloma, end-stage renal disease on dialysis, hypertension presented to the emergency room with seizure and elevated blood pressure.   #fever-etiology unclear Pancultures ordered,blood cultures are negative so far, urine cultures are pending Broad-spectrum IV antibiotics IV Zosyn and vancomycin started 11/14/2016 Chest x-ray with pulmonary edema Infectious Disease recommendations appreciated   Suggested to continue IV abx one more day.   Had c/o diarrhea, but stool is now soft to formed, no need to check C diff.  # nausea and vomit once on 11/19/16    Check lipase and LFTs.  #History of multiple myeloma with lytic bony lesions-pancytopenia Patient is complaining bilateral knee pain, could be from underlying multiple myeloma  Discussed with Dr. Grayland Ormond who reports that her prognosis is poor with no good  treatment options and recommended palliative care consult , palliative care consult is placed Patient will have palliative care services, hospice services at home. Skilled therapy requested by physical therapy but patient has no insurance and  has good family support. She will be going home likely tomorrow if she feels well. Patient is on fentanyl patch for the pain control. Patient has multiple antibodies but okay to transfuse blood per Dr. Grayland Ormond Monitor CBC and including platelet count  # thrombocytopenia platelet transfusion yesterday.   #. Hyper .tensive urgency Clinically improving Continue metoprolol 25 mg , verapamil 120 mg and hydralazine 25 mg every 8 hours. Clonidine is added to the regimen for better control of the blood pressure, titrate as needed. Goal systolic blood pressure less than 160  #. Posterior reversible encephalopathy syndrome;MRI of the brain did not show any acute stroke. Showed only PRES>patient is off nicardipine drip. Transferred out of ICU.  #.New onset seizure; by mouth Keppra.  Renally dose the Keppra.  No other episodes of seizures noticed  # End-stage renal disease on dialysis,nephro is following Patient's shortness of breath improved after 3 consecutive days of hemodialysis   # clotted HD fistula   Nephro consulted vascular for procedure for declotting.   Declotting done, HD done   Prognosis really poor and high risk for cardiac arrest.. .patient will likely go home tomorrow with hospital bed, rolling walker. I called her daughter again today she told me that patient does not feel well enough to go home today. Enter opathogenic Escherichia coli diarrhea: Symptomatic treatment at this time. No antibiotics are needed.   All the records are reviewed and case discussed with Care Management/Social Worker. Management plans discussed with the patient, her daughter , and explained patient's poor prognosis with declining clinical status .he is agreeable with palliative care consult   CODE STATUS: Full Code  TOTAL TIME TAKING CARE OF THIS PATIENT: 35 minutes.   More than 50% of the time was spent in counseling/coordination of care: YES discussed with patient's daughter.  POSSIBLE D/C IN 3  DAYS, DEPENDING ON CLINICAL CONDITION.   Epifanio Lesches M.D on 11/20/2016 at 12:10 PM  Between 7am to 6pm - Pager - [phone]  After 6pm go to [email or web address] - [facility]

## 2016-11-21 LAB — GLUCOSE, CAPILLARY
GLUCOSE-CAPILLARY: 190 mg/dL — AB (ref 65–99)
Glucose-Capillary: 115 mg/dL — ABNORMAL HIGH (ref 65–99)
Glucose-Capillary: 156 mg/dL — ABNORMAL HIGH (ref 65–99)
Glucose-Capillary: 509 mg/dL (ref 65–99)

## 2016-11-21 MED ORDER — HYDRALAZINE HCL 50 MG PO TABS
50.0000 mg | ORAL_TABLET | Freq: Three times a day (TID) | ORAL | Status: DC
  Administered 2016-11-21: 50 mg via ORAL
  Filled 2016-11-21 (×2): qty 1

## 2016-11-21 MED ORDER — CARVEDILOL 6.25 MG PO TABS
6.2500 mg | ORAL_TABLET | Freq: Two times a day (BID) | ORAL | Status: DC
  Administered 2016-11-21: 6.25 mg via ORAL
  Filled 2016-11-21: qty 1

## 2016-11-21 MED ORDER — AMOXICILLIN-POT CLAVULANATE 500-125 MG PO TABS
1.0000 | ORAL_TABLET | ORAL | Status: DC
  Filled 2016-11-21: qty 1

## 2016-11-21 MED ORDER — BIOTENE DRY MOUTH MT LIQD: 15.0000 mL | OROMUCOSAL | Status: DC | PRN

## 2016-11-21 MED ORDER — GLYCOPYRROLATE 1 MG PO TABS
1.0000 mg | ORAL_TABLET | ORAL | Status: DC | PRN
  Filled 2016-11-21: qty 1

## 2016-11-21 MED ORDER — GLYCOPYRROLATE 0.2 MG/ML IJ SOLN
0.2000 mg | INTRAMUSCULAR | Status: DC | PRN
  Filled 2016-11-21: qty 1

## 2016-11-21 MED ORDER — MORPHINE SULFATE (PF) 2 MG/ML IV SOLN
1.0000 mg | INTRAVENOUS | Status: DC | PRN
  Administered 2016-11-21: 1 mg via INTRAVENOUS
  Filled 2016-11-21: qty 1

## 2016-11-21 MED ORDER — POLYVINYL ALCOHOL 1.4 % OP SOLN
1.0000 [drp] | Freq: Four times a day (QID) | OPHTHALMIC | Status: DC | PRN
  Filled 2016-11-21: qty 15

## 2016-11-22 MED FILL — Medication: Qty: 1 | Status: AC

## 2016-12-06 NOTE — Progress Notes (Signed)
Responded to rapid response/code blue. Patient bagged with ambu bag 100% o2 due to agonal respirations. Decision made to cease all efforts for this patient. Placed patient on 100% nrb mask

## 2016-12-06 NOTE — Progress Notes (Signed)
Pt. Having shallow respirations with agonal breathing, Dr. Doy Hutching at bedside, pt's. Code status discussed with family by Dr. Doy Hutching. Family stated it was pt's wishes to be full code but they wanted to wait for the rest of the family to make a decision about code status. Will continue to monitor pt.

## 2016-12-06 NOTE — Progress Notes (Signed)
Family left, tele box removed pt. Prepared pt. For morgue.

## 2016-12-06 NOTE — Progress Notes (Signed)
Family wishes pt. Not to receive morphine at this time. Daughter stated they will let me know when they are ready for pt. To have morphine.

## 2016-12-06 NOTE — ED Provider Notes (Signed)
[facility] Department of Emergency Medicine   Code Blue CONSULT NOTE  Chief Complaint: Cardiac arrest/unresponsive   Level V Caveat: Unresponsive  History of present illness: I was contacted by the hospital for a CODE BLUE cardiac arrest upstairs and presented to the patient's bedside. shortness patient has a very extensive and complicated past medical history including multiple myeloma with endstage renal disease with chronic encephalopathy presenting with seizures with CODE BLUE called for unresponsiveness as well as bradycardia.  on my arrival the patient was unresponsive with agonal respirations. Pulse ox was 81%. She had bradycardia on the monitor after receiving atropine. She did have palpable pulses.  ROS: Unable to obtain, Level V caveat  Scheduled Meds: . fentaNYL  25 mcg Transdermal Q72H  . mouth rinse  15 mL Mouth Rinse BID   Continuous Infusions: PRN Meds:.acetaminophen **OR** acetaminophen, antiseptic oral rinse, glycopyrrolate **OR** glycopyrrolate **OR** glycopyrrolate, LORazepam, morphine injection, ondansetron **OR** ondansetron (ZOFRAN) IV, polyvinyl alcohol Past Medical History:  Diagnosis Date  . Anemia   . Anxiety   . Chronic kidney disease    DIALYSIS  . Depression   . Diabetes mellitus without complication ([place])   . Dialysis patient ([place])    Mon, Wed, Fri  . History of chemotherapy    chemo on Tuesday and Wednesday  . Hypertension   . Multiple myeloma ([place])   . Neuropathy associated with cancer ([place])   . Osteoarthritis   . Pancreatitis   . Pneumonia    September 2017, [facility]  . Post-menopausal 2014  . Sepsis [facility])    Past Surgical History:  Procedure Laterality Date  . A/V SHUNT INTERVENTION Left 07/07/2016   Procedure: A/V Shunt Intervention;  Surgeon: Algernon Huxley, MD;  Location: [place] CV LAB;  Service: Cardiovascular;  Laterality: Left;  . A/V SHUNT INTERVENTION N/A 11/12/2016   Procedure: A/V SHUNT INTERVENTION;   Surgeon: Algernon Huxley, MD;  Location: [place] CV LAB;  Service: Cardiovascular;  Laterality: N/A;  . AV FISTULA PLACEMENT Left 02/20/2016   Procedure: INSERTION OF ARTERIOVENOUS (AV) GORE-TEX GRAFT ARM;  Surgeon: Katha Cabal, MD;  Location: [facility] ORS;  Service: Vascular;  Laterality: Left;  . CESAREAN SECTION     x2  . dialysis catheter placement    . PERIPHERAL VASCULAR CATHETERIZATION N/A 11/19/2015   Procedure: Dialysis/Perma Catheter Insertion;  Surgeon: Algernon Huxley, MD;  Location: [place] CV LAB;  Service: Cardiovascular;  Laterality: N/A;  . PERIPHERAL VASCULAR CATHETERIZATION Left 03/10/2016   Procedure: Thrombectomy;  Surgeon: Katha Cabal, MD;  Location: [place] CV LAB;  Service: Cardiovascular;  Laterality: Left;  . PERIPHERAL VASCULAR CATHETERIZATION N/A 03/31/2016   Procedure: Dialysis/Perma Catheter Removal;  Surgeon: Katha Cabal, MD;  Location: [place] CV LAB;  Service: Cardiovascular;  Laterality: N/A;  . PORTACATH PLACEMENT    . TUBAL LIGATION     Social History   Social History  . Marital status: Married    Spouse name: N/A  . Number of children: N/A  . Years of education: N/A   Occupational History  . retired    Social History Main Topics  . Smoking status: Never Smoker  . Smokeless tobacco: Never Used  . Alcohol use No  . Drug use: No  . Sexual activity: Not on file   Other Topics Concern  . Not on file   Social History Narrative  . No narrative on file   No Known Allergies  Last set of Vital Signs (not  current) Vitals:   11-26-2016 1934 11-26-2016 1947  BP: (!) 75/40 (!) 87/55  Pulse: (!) 39 (!) 55  Resp: (!) 4 12  Temp:    SpO2: 90% (!) 81%      Physical Exam  Gen: unresponsive Cardiovascular: bradycardic and weak peripheral pulses Resp: agonal breathing, Breath sounds equal bilaterally with bagging  Abd: nondistended  Neuro: GCS 3, unresponsive to pain  HEENT: No blood in posterior pharynx,  Neck: No  crepitus  Musculoskeletal: No deformity  Skin: warm    Medical Decision making  patient with critical illness with agonal respirations and bradycardia with hypotension. On my arrival to the room we initiated bag valve mask and also given additional dose of atropine. She was started on 1 L normal saline bolus. Reportedly Dr. Doy Hutching of the hospitalist group was also at bedside and had been initiating discussions with family regarding goals of care. After discussion with the family, with respect to her exceedingly poor prognosis of a decided to proceed with comfort measures   Assessment and Plan  continue care by hospitalist service. Comfort measures only.    Merlyn Lot, MD 11-26-2016 [id number]

## 2016-12-06 NOTE — Progress Notes (Signed)
[facility] [place] NAME: Jamie Keller    MR#:  [id number]  [place]:  Jun 23, 1965  Patient has fatigue, weakness. Daughter at bedside. He didn't sleep all night because of back pain and bone pains. No further diarrhea.   CHIEF COMbloINT:   Chief Complaint  Patient presents with  . Seizures    REVIEW OF SYSTEMS:  Review of Systems  Constitutional: Positive for malaise/fatigue. Negative for chills and fever.  HENT: Negative for hearing loss.   Eyes: Negative for blurred vision, double vision and photophobia.  Respiratory: Negative for cough, hemoptysis and shortness of breath.   Cardiovascular: Negative for palpitations, orthopnea and leg swelling.  Gastrointestinal: Negative for abdominal pain, diarrhea and vomiting.  Genitourinary: Negative for dysuria and urgency.  Musculoskeletal: Positive for joint pain. Negative for myalgias and neck pain.  Skin: Negative for rash.  Neurological: Positive for weakness. Negative for dizziness, focal weakness, seizures and headaches.  Psychiatric/Behavioral: Negative for memory loss. The patient does not have insomnia.     DRUG ALLERGIES:  No Known Allergies VITALS:  Blood pressure (!) 170/57, pulse (!) 101, temperature 98.9 F (37.2 C), temperature source Axillary, resp. rate (!) 22, height 5' (1.524 m), weight 52.7 kg (116 lb 2.9 oz), last menstrual period 02/25/1995, SpO2 95 %. PHYSICAL EXAMINATION:  Physical Exam  Constitutional:  Alert, awake, oriented.  HENT:  Head: Normocephalic.  Mouth/Throat: Oropharynx is clear and moist.  Eyes: No scleral icterus.  Neck: Neck supple. No JVD present. No tracheal deviation present.  Cardiovascular: Normal rate, regular rhythm and normal heart sounds.  Exam reveals no gallop.   No murmur heard. Pulmonary/Chest: Effort normal and breath sounds normal. No respiratory distress. She has no wheezes. She has no rales.  Abdominal: Soft.  Bowel sounds are normal. She exhibits no distension. There is no tenderness. There is no rebound.  Musculoskeletal: She exhibits no edema or tenderness.  Bilateral knee tenderness is present  Neurological: No cranial nerve deficit.  .  Skin: No rash noted. No erythema.   LABORATORY PANEL:  Female CBC  Recent Labs Lab 11/19/16 1040  WBC 5.9  HGB 7.8*  HCT 22.6*  PLT 17*   ------------------------------------------------------------------------------------------------------------------ Chemistries   Recent Labs Lab 11/19/16 1040 11/20/16 0747  NA 131* 132*  K 4.4 4.4  CL 89* 89*  CO2 28 29  GLUCOSE 128* 137*  BUN 32* 16  CREATININE 4.39* 2.67*  CALCIUM 13.3* 13.3*  AST 22  --   ALT 11*  --   ALKPHOS 134*  --   BILITOT 1.0  --    RADIOLOGY:  No results found. ASSESSMENT AND PLAN:   IMPRESSION AND PLAN: 51 year old female patient with history of multiple myeloma, end-stage renal disease on dialysis, hypertension presented to the emergency room with seizure and elevated blood pressure.   #fever-etiology unclear, Fever is coming down now. Pancultures ordered,blood cultures are negative so far, urine cultures are pending Broad-spectrum IV antibiotics IV Zosyn and vancomycin started 11/14/2016 Chest x-ray with pulmonary edema Infectious Disease recommendations appreciated   Patient has enteropathogenic Escherichia coli .  Conservative Treatment Only.  # nausea and vomit once on 11/19/16 No further nausea or vomiting. But by mouth intake is poor. multiplemyelloma with lytic bony lesions-pancytopenia Patient is complaining bilateral knee pain, could be from underlying multiple myeloma  Discussed with Dr. Grayland Ormond who reports that her prognosis is poor with no good treatment options and recommended palliative care consult , palliative care  consult is placed Patient will have palliative care services, hospice services at home. Skilled therapy requested by physical therapy  but patient  Patient needs to go in wheelchair to hemodialysis when she goes home. But she did not even get up from bed since admission. We'll try to see if she can go to dialysis tomorrow in wheelchair.  Family he is unsafe to take her home today without any arrangements.. . Patient is on fentanyl patch for the pain control. Patient has multiple antibodies but okay to transfuse blood per Dr. Grayland Ormond Monitor CBC and including platelet count  # thrombocytopenia platelet transfusion yesterday.   #. Hyper .tensive urgency Clinically improving, blood pressure elevated at times. Increase hydralazine to 50 mg 3 times a day.  Continue metoprolol 25 mg , verapamil 120 mg and . Clonidine is added to the regimen for better control of the blood pressure, titrate as needed. Goal systolic blood pressure less than 160  #. Posterior reversible encephalopathy syndrome;MRI of the brain did not show any acute stroke. Showed only PRES>patient is off nicardipine drip. Transferred out of ICU.  #.New onset seizure; by mouth Keppra.  Renally dose the Keppra.  No other episodes of seizures noticed  # End-stage renal disease on dialysis,nephro is following Patient's shortness of breath improved after 3 consecutive days of hemodialysis   # clotted HD fistula   Nephro consulted vascular for procedure for declotting.   Declotting done, HD done   Prognosis really poor and high risk for cardiac arrest.. .patient will likely go home tomorrow with hospital bed, rolling walker. I called her daughter again today she told me that patient does not feel well enough to go home today. Enter opathogenic Escherichia coli diarrhea: Symptomatic treatment at this time. No antibiotics are needed.   All the records are reviewed and case discussed with Care Management/Social Worker. Management plans discussed with the patient, her daughter , and explained patient's poor prognosis with declining clinical status .he is agreeable  with palliative care consult   CODE STATUS: Full Code  TOTAL TIME TAKING CARE OF THIS PATIENT: 35 minutes.   More than 50% of the time was spent in counseling/coordination of care: YES discussed with patient's daughter.  POSSIBLE D/C IN 3 DAYS, DEPENDING ON CLINICAL CONDITION.   Epifanio Lesches M.D on 12-18-2016 at 9:08 AM  Between 7am to 6pm - Pager - [phone]  After 6pm go to [email or web address] - [facility]

## 2016-12-06 NOTE — Progress Notes (Signed)
Responded to code blue in patient's room. Patient found to have agonal breaths with a pulse. Rescue breaths initiated by RT. While preparing to intubate patient, family decided to make her a DNR/DNI/Comfort care. Procedure aborted and end of life care orders placed in patient's chart. Support provided to the family.  Artis Beggs S. [facility] ANP-BC Pulmonary and Critical Care Medicine [facility] Pager [phone] or [phone]

## 2016-12-06 NOTE — Progress Notes (Signed)
Rapid Response Event Note  Overview:RR called to rm 239 pt connected to code cart pads in place HR reading Bradycardia in 20's pt has agonal breathing with a visual gaze. Family at bedside discussing code status.      Initial Focused Assessment:   Interventions:Atropine was given with response normal saline bolus. Resp. At bedside . Code blue called to possibly intubate . Family then agreed to change code status to DNR  Plan of Care (if not transferred):pt code status changed remained in rm 239 on comfort care  Event Summary:   at  [place]    at          [facility]

## 2016-12-06 NOTE — Progress Notes (Signed)
CH responded to a PG for a RR. RR became a Code Blue. Two family members were beside at the call of the Code. Other family members awaited in the hallway. The family had a difficult time agreeing to a DNR, but came to the decision based on the MD's update. [place] provided a calming presence and prayer. Family wanted time alone with Pt. [place] will follow up as needed.    12-09-2016 2000  Clinical Encounter Type  Visited With Patient;Patient and family together;Health care provider  Visit Type Initial;Spiritual support;Code;Patient actively dying (Code RR and Code Blue)  Referral From Nurse  Spiritual Encounters  Spiritual Needs Prayer;Emotional;Grief support

## 2016-12-06 NOTE — Progress Notes (Signed)
Pt very lethargic. Not taking any food. Need to crush meds. Vss. Family at bedside. Dr. Candiss Norse spoke with them.

## 2016-12-06 NOTE — Progress Notes (Signed)
Pt. Expired at 2205. Second nurse verification no pulse, heart beat or breathing observed. Verified with Crystal, RN. Family informed. Priest at bedside along with Chaplain.

## 2016-12-06 NOTE — Discharge Summary (Signed)
    Death Note please see Last Note for all details.   In breif -unfortunate 51 year old female patient admitted onSeptember 3 for seizures, found to have hypertensive urgency, posterior reversible encephalopathy, new onset seizures. Admitted to stepdown unit, started on Keppra, IV nicardipine drip for hypertension, Ativan and also for seizures, obtained neurology, cardiology, nephrology consults. Patient has advanced multiple myeloma, ESRD on hemodialysis. Blood pressure was controlled, transferred out of ICU to regular floor, seen by neurologist, Keppra has been changed 2 tablets. However patient condition continued to deteriorate with severe deconditioning and not able to get out of the bed. Patient did have fever so she also was seen by Dr. date July the started on IV Zosyn, vancomycin on September 9, patient received antibiotics in IV form. Still September 15. Can continue Her by mouth intake, nausea, shortness of breath, severe deconditioning. Plan was to discharge her home with hospital bed, rolling walker but patient had agonal breathing,, CODE BLUE was called on 26-Nov-2022 evening, condition discussed with patient's family, family decided to make her comfort care and son wanted DO NOT RESUSCITATE. Patient has poor prognosis because of her advanced multiple myeloma, ESRD, hypertensive urgency, epilepsy. Patient pronounced dead on 2022/11/26 at 10:05 PM.   Jamie Keller TJQ:[id number] is a 51 y.o. female, Outpatient Primary MD for the patient is Revelo, [name], MD  Pronounced dead by on September 16/2018 at 22.05.                  Cause of death; respiratory distress and cardiac arrest End-stage renal disease  Advanced multiple myeloma with lytic lesions, pancytopenia Hypertensive emergency Seizures Posterior reversible encephalopathy secondary to hypertensive  emergency   Total clinical and documentation time for today Under 30 minutes   Last Note' disease refer to my progress note, CODE BLUE note also.

## 2016-12-06 NOTE — Progress Notes (Signed)
[place] was asked to contact a priest to perform the last rights. [place] contacted the priest and comforted the family.

## 2016-12-06 NOTE — Progress Notes (Signed)
Pt having shallow resps. Skin warm moist. Bp76/41.spoke with family about code status. They were unable to make a decision about this. Spoke with dr. Doy Hutching to request that he speak to the family to ascertain their wishes. Family wanted to speak with other family members.

## 2016-12-06 NOTE — Progress Notes (Signed)
CCDM, bed placement, nursing supervisor, [employer],  and Dr. Ara Kussmaul notified of pt. Expiring at 2205.

## 2016-12-06 NOTE — Progress Notes (Signed)
Pt. Asystole on monitor. Went to assess the pt. Priest at bedside doing last rights. Once Jamie Keller was finished pt assessed, no heart beat, pulse or breathing noted. Went to get another nurse for verification.

## 2016-12-06 NOTE — Progress Notes (Signed)
Jamie Keller is a 51 y.o. female patient. 1. Status epilepticus ([place])   2. PRES (posterior reversible encephalopathy syndrome)   3. Multiple myeloma in relapse ([place])   4. Wheezing   5. SOB (shortness of breath)   6. Knee pain, bilateral    Past Medical History:  Diagnosis Date  . Anemia   . Anxiety   . Chronic kidney disease    DIALYSIS  . Depression   . Diabetes mellitus without complication ([place])   . Dialysis patient ([place])    Mon, Wed, Fri  . History of chemotherapy    chemo on Tuesday and Wednesday  . Hypertension   . Multiple myeloma ([place])   . Neuropathy associated with cancer ([place])   . Osteoarthritis   . Pancreatitis   . Pneumonia    September 2017, [facility]  . Post-menopausal 2014  . Sepsis ([place])        No Known Allergies Active Problems:   Hypertensive urgency   Hypercalcemia   Status epilepticus (H[facility])   Palliative care encounter   Goals of care, counseling/discussion   PRES (posterior reversible encephalopathy syndrome)   ESRD on dialysis ([place])  Blood pressure (!) 87/55, pulse (!) 55, temperature 100 F (37.8 C), temperature source Axillary, resp. rate 12, height 5' (1.524 m), weight 52.7 kg (116 lb 2.9 oz), last menstrual period 02/25/1995, SpO2 (!) 81 %.  Subjective CTSP with agonal respirations Objective: exam per previous progress note Assessment & Plan Discussed with family who have decided to make pt DNR with comfort care due to poor prognosis and impending death  SPARKS,JEFFREY D 02-Dec-2016

## 2016-12-06 NOTE — Progress Notes (Signed)
Daughter requested we call [employer] for patients other daughter in [employer] at [employer] so she can get FMLA. Supervisor notified and making arrangements with [employer], family informed.

## 2016-12-06 NOTE — Progress Notes (Signed)
Family requested Chaplain come to room and a [place] as well. Chaplain paged and he said he could contact the [place].

## 2016-12-06 NOTE — Progress Notes (Signed)
[facility]  ROUNDING NOTE   Subjective:   Patient remains in poor health. Nursing staff reports that her appetite is very poor. Her daughter is in the room. Laying in the bed. Patient did not wake up for interview.   Objective:  Vital signs in last 24 hours:  Temp:  [98.1 F (36.7 C)-100 F (37.8 C)] 100 F (37.8 C) (09/16 1207) Pulse Rate:  [81-112] 81 (09/16 1207) Resp:  [18-22] 18 (09/16 1207) BP: (136-191)/(50-74) 136/50 (09/16 1207) SpO2:  [90 %-95 %] 92 % (09/16 1207)  Weight change:  Filed Weights   11/18/2016 1520 11/19/16 1100 11/19/16 1345  Weight: 50.8 kg (112 lb) 55.6 kg (122 lb 9.2 oz) 52.7 kg (116 lb 2.9 oz)    Intake/Output: I/O last 3 completed shifts: In: 456 [Blood:456] Out: 0    Intake/Output this shift:  No intake/output data recorded.  Physical Exam: General: laying in bed  Head:  Moist oral mucosal membranes  Eyes: Anicteric  Neck: Right IJ port  Lungs:  Clear to auscultation bilaterally, normal respiratory effort  Heart: S1S2 no rubs  Abdomen:  Soft, nontender  Extremities: No peripheral edema.  Neurologic: Resting quietly  Skin: No lesions  Access: LUE AVF,     Basic Metabolic Panel:  Recent Labs Lab 11/15/16 1149 12/02/2016 0454 11/18/16 0409 11/19/16 1040 11/20/16 0747  NA 133* 131* 128* 131* 132*  K 4.5 4.5 4.4 4.4 4.4  CL 93* 90* 89* 89* 89*  CO2 28 28 26 28 29   GLUCOSE 202* 111* 143* 128* 137*  BUN 28* 30* 42* 32* 16  CREATININE 3.93* 3.83* 5.17* 4.39* 2.67*  CALCIUM 11.3* 12.1* 11.7* 13.3* 13.3*  PHOS 5.8*  --  5.6* 5.7*  --     Liver Function Tests:  Recent Labs Lab 11/15/16 1149 11/18/16 0409 11/19/16 1040  AST 22  --  22  ALT 11*  --  11*  ALKPHOS 102  --  134*  BILITOT 0.6  --  1.0  PROT 5.2*  --  5.7*  ALBUMIN 2.4*  2.6* 2.2* 2.4*  2.3*    Recent Labs Lab 11/19/16 1040  LIPASE 57*   No results for input(s): AMMONIA in the last 168 hours.  CBC:  Recent Labs Lab 11/15/16 0435  11/15/16 1149 11/16/16 0600 11/16/16 1824 12/03/2016 0454 11/18/16 0409 11/19/16 1040  WBC 2.9* 2.2* 2.7*  --  3.9 5.0 5.9  NEUTROABS 2.4  --  1.9  --   --   --  5.0  HGB 7.1* 6.2* 5.9* 7.7* 8.3* 8.5* 7.8*  HCT 21.1* 17.9* 17.4* 22.4* 24.0* 24.2* 22.6*  MCV 88.9 89.7 87.7  --  88.8 89.6 90.3  PLT 33* 29* 20*  --  20* 20* 17*    Cardiac Enzymes: No results for input(s): CKTOTAL, CKMB, CKMBINDEX, TROPONINI in the last 168 hours.  BNP: Invalid input(s): POCBNP  CBG:  Recent Labs Lab 11/20/16 1151 11/20/16 1700 11/20/16 2058 Nov 24, 2016 0750 11/24/2016 1208  GLUCAP 165* 126* 136* 115* 190*    Microbiology: Results for orders placed or performed during the hospital encounter of 11/27/2016  MRSA PCR Screening     Status: None   Collection Time: 12/03/2016  3:41 AM  Result Value Ref Range Status   MRSA by PCR NEGATIVE NEGATIVE Final    Comment:        The GeneXpert MRSA Assay (FDA approved for NASAL specimens only), is one component of a comprehensive MRSA colonization surveillance program. It is not intended  to diagnose MRSA infection nor to guide or monitor treatment for MRSA infections.   Urine Culture     Status: Abnormal   Collection Time: 11/10/16  2:19 AM  Result Value Ref Range Status   Specimen Description URINE, RANDOM  Final   Special Requests NONE  Final   Culture MULTIPLE SPECIES PRESENT, SUGGEST RECOLLECTION (A)  Final   Report Status 11/11/2016 FINAL  Final  CULTURE, BLOOD (ROUTINE X 2) w Reflex to ID Panel     Status: None   Collection Time: 11/14/16  8:26 AM  Result Value Ref Range Status   Specimen Description BLOOD RIGHT ANTECUBITAL  Final   Special Requests   Final    BOTTLES DRAWN AEROBIC AND ANAEROBIC Blood Culture adequate volume   Culture NO GROWTH 5 DAYS  Final   Report Status 11/19/2016 FINAL  Final  CULTURE, BLOOD (ROUTINE X 2) w Reflex to ID Panel     Status: None   Collection Time: 11/14/16  8:38 AM  Result Value Ref Range Status    Specimen Description BLOOD BLOOD RIGHT HAND  Final   Special Requests   Final    BOTTLES DRAWN AEROBIC AND ANAEROBIC Blood Culture adequate volume   Culture NO GROWTH 5 DAYS  Final   Report Status 11/19/2016 FINAL  Final  C difficile quick scan w PCR reflex     Status: None   Collection Time: 11/20/16  7:00 AM  Result Value Ref Range Status   C Diff antigen NEGATIVE NEGATIVE Final   C Diff toxin NEGATIVE NEGATIVE Final   C Diff interpretation No C. difficile detected.  Final  Gastrointestinal Panel by PCR , Stool     Status: Abnormal   Collection Time: 11/20/16  7:00 AM  Result Value Ref Range Status   Campylobacter species NOT DETECTED NOT DETECTED Final   Plesimonas shigelloides NOT DETECTED NOT DETECTED Final   Salmonella species NOT DETECTED NOT DETECTED Final   Yersinia enterocolitica NOT DETECTED NOT DETECTED Final   Vibrio species NOT DETECTED NOT DETECTED Final   Vibrio cholerae NOT DETECTED NOT DETECTED Final   Enteroaggregative E coli (EAEC) NOT DETECTED NOT DETECTED Final   Enteropathogenic E coli (EPEC) DETECTED (A) NOT DETECTED Final    Comment: RESULT CALLED TO, READ BACK BY AND VERIFIED WITH: SUSAN PRESTO ON 11/20/16 AT 0915 QSD    Enterotoxigenic E coli (ETEC) NOT DETECTED NOT DETECTED Final   Shiga like toxin producing E coli (STEC) NOT DETECTED NOT DETECTED Final   Shigella/Enteroinvasive E coli (EIEC) NOT DETECTED NOT DETECTED Final   Cryptosporidium NOT DETECTED NOT DETECTED Final   Cyclospora cayetanensis NOT DETECTED NOT DETECTED Final   Entamoeba histolytica NOT DETECTED NOT DETECTED Final   Giardia lamblia NOT DETECTED NOT DETECTED Final   Adenovirus F40/41 NOT DETECTED NOT DETECTED Final   Astrovirus NOT DETECTED NOT DETECTED Final   Norovirus GI/GII NOT DETECTED NOT DETECTED Final   Rotavirus A NOT DETECTED NOT DETECTED Final   Sapovirus (I, II, IV, and V) NOT DETECTED NOT DETECTED Final    Coagulation Studies: No results for input(s): LABPROT, INR in  the last 72 hours.  Urinalysis: No results for input(s): COLORURINE, LABSPEC, PHURINE, GLUCOSEU, HGBUR, BILIRUBINUR, KETONESUR, PROTEINUR, UROBILINOGEN, NITRITE, LEUKOCYTESUR in the last 72 hours.  Invalid input(s): APPERANCEUR    Imaging: No results found.   Medications:   . sodium chloride    . sodium chloride     . amoxicillin-clavulanate  1 tablet Oral Q24H  .  aspirin EC  81 mg Oral Daily  . carvedilol  6.25 mg Oral BID WC  . cinacalcet  60 mg Oral Q supper  . cloNIDine  0.1 mg Oral BID  . epoetin (EPOGEN/PROCRIT) injection  10,000 Units Intravenous Q M,W,F-HD  . fentaNYL  25 mcg Transdermal Q72H  . hydrALAZINE  50 mg Oral Q8H  . insulin aspart  0-15 Units Subcutaneous TID WC & HS  . irbesartan  300 mg Oral QHS  . levETIRAcetam  500 mg Oral Daily   And  . levETIRAcetam  250 mg Oral Once per day on Mon Wed Fri  . mouth rinse  15 mL Mouth Rinse BID  . MUSCLE RUB   Topical BID  . ondansetron (ZOFRAN) IV  4 mg Intravenous TID AC  . ondansetron  4 mg Oral Once  . sodium chloride flush  10-40 mL Intracatheter Q12H  . verapamil  120 mg Oral Daily   acetaminophen **OR** acetaminophen, albuterol, labetalol, LORazepam, ondansetron **OR** ondansetron (ZOFRAN) IV, oxyCODONE, prochlorperazine, sodium chloride flush  Assessment/ Plan:  51 y.o.Hispanic Spanish Speaking only female with diabetes mellitus type II, hypertension, multiple myeloma, ESRD on hemodialysis.   [facility] MWF [address] AVF  1. End Stage Renal Disease: ESRD secondary to multiple myeloma.  - successful angioplasty of AV access done on Wednesday. - Next hemodialysis planned for Monday - Trial of hemodialysis in the chair as patient has not been out of bed this hospitalization  2. Anemia of chronic kidney disease with multiple myeloma:  - Hemoglobin improved to 7.8 with blood transfusion this admission, continues to have low platelets - EPO with HD treatment continued  3. Secondary  Hyperparathyroidism:   -Calcium continues to be elevated,13.3 at last check -Secondary to her underlying multiple myeloma. - Phosphorus elevated 9/14 at 5.7 - will start binders when pt is able to eat a normal diet  4. Fever  - Evaluated by ID. Augmentin recommended.  5. Multiple myeloma - poor prognosis - palliative care discussion in progress - limited options; hospice is being considered   LOS: 13 Avigdor Dollar 09-Oct-20181:18 PM

## 2016-12-06 NOTE — Progress Notes (Signed)
Pt's HR dropping in the 30's, she continues to agonally breath. Rapid Response call, pads placed on pt. Back board also in place, code cart brought to room. Rapid Response arrived, NS started, atropine given, pt. Bagged In preparation for intubation. Dr. Doy Hutching spoke with the family once again and they decided to make pt. A DNR and place on comfort care. Pt. Cleaned, pads removed and prepared for the family to visit. Pt placed on a non-re breather. Will continue to monitor pt.

## 2016-12-06 DEATH — deceased

## 2016-12-17 ENCOUNTER — Other Ambulatory Visit: Payer: Self-pay | Admitting: Nurse Practitioner

## 2017-08-28 IMAGING — CR DG CHEST 2V
2 series · 2 of 2 positions shown · non-contrast
Comparison: 11/13/2015

CLINICAL DATA: Abdominal pain.  TB.

EXAM:
CHEST  2 VIEW

[chest pa]
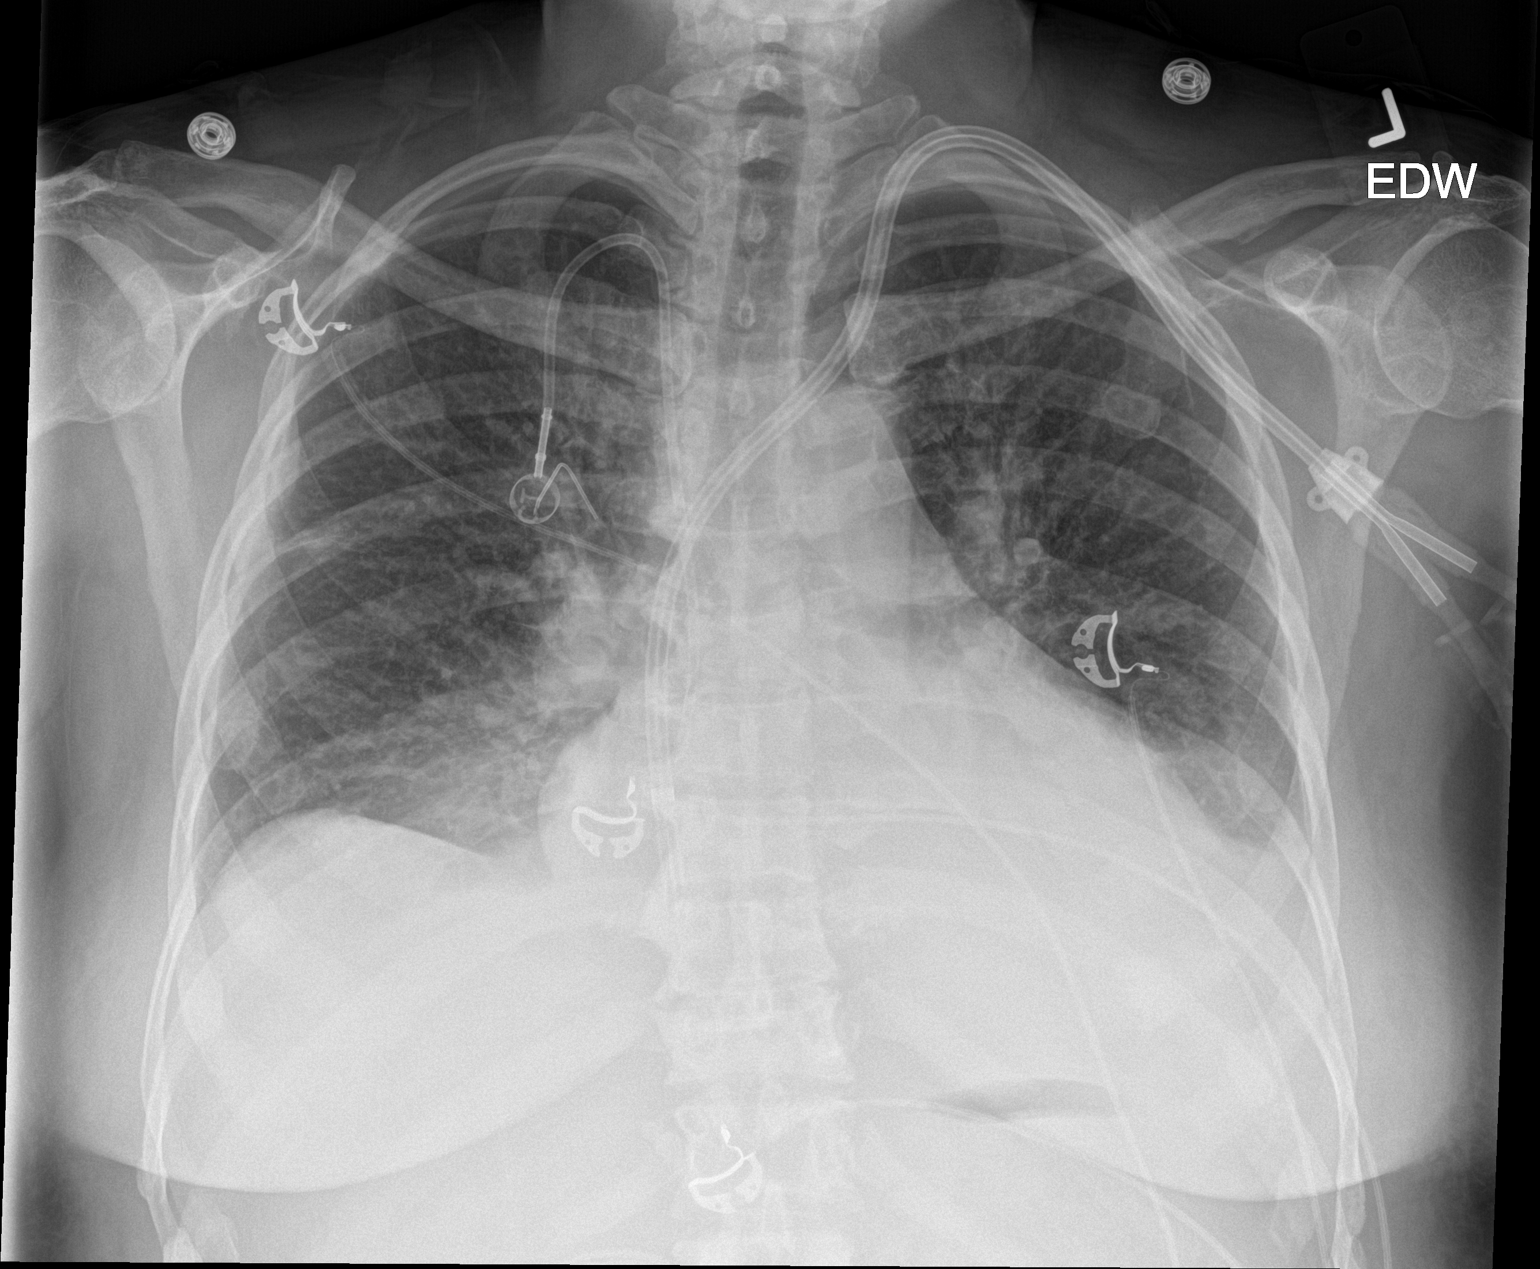

[chest lat]
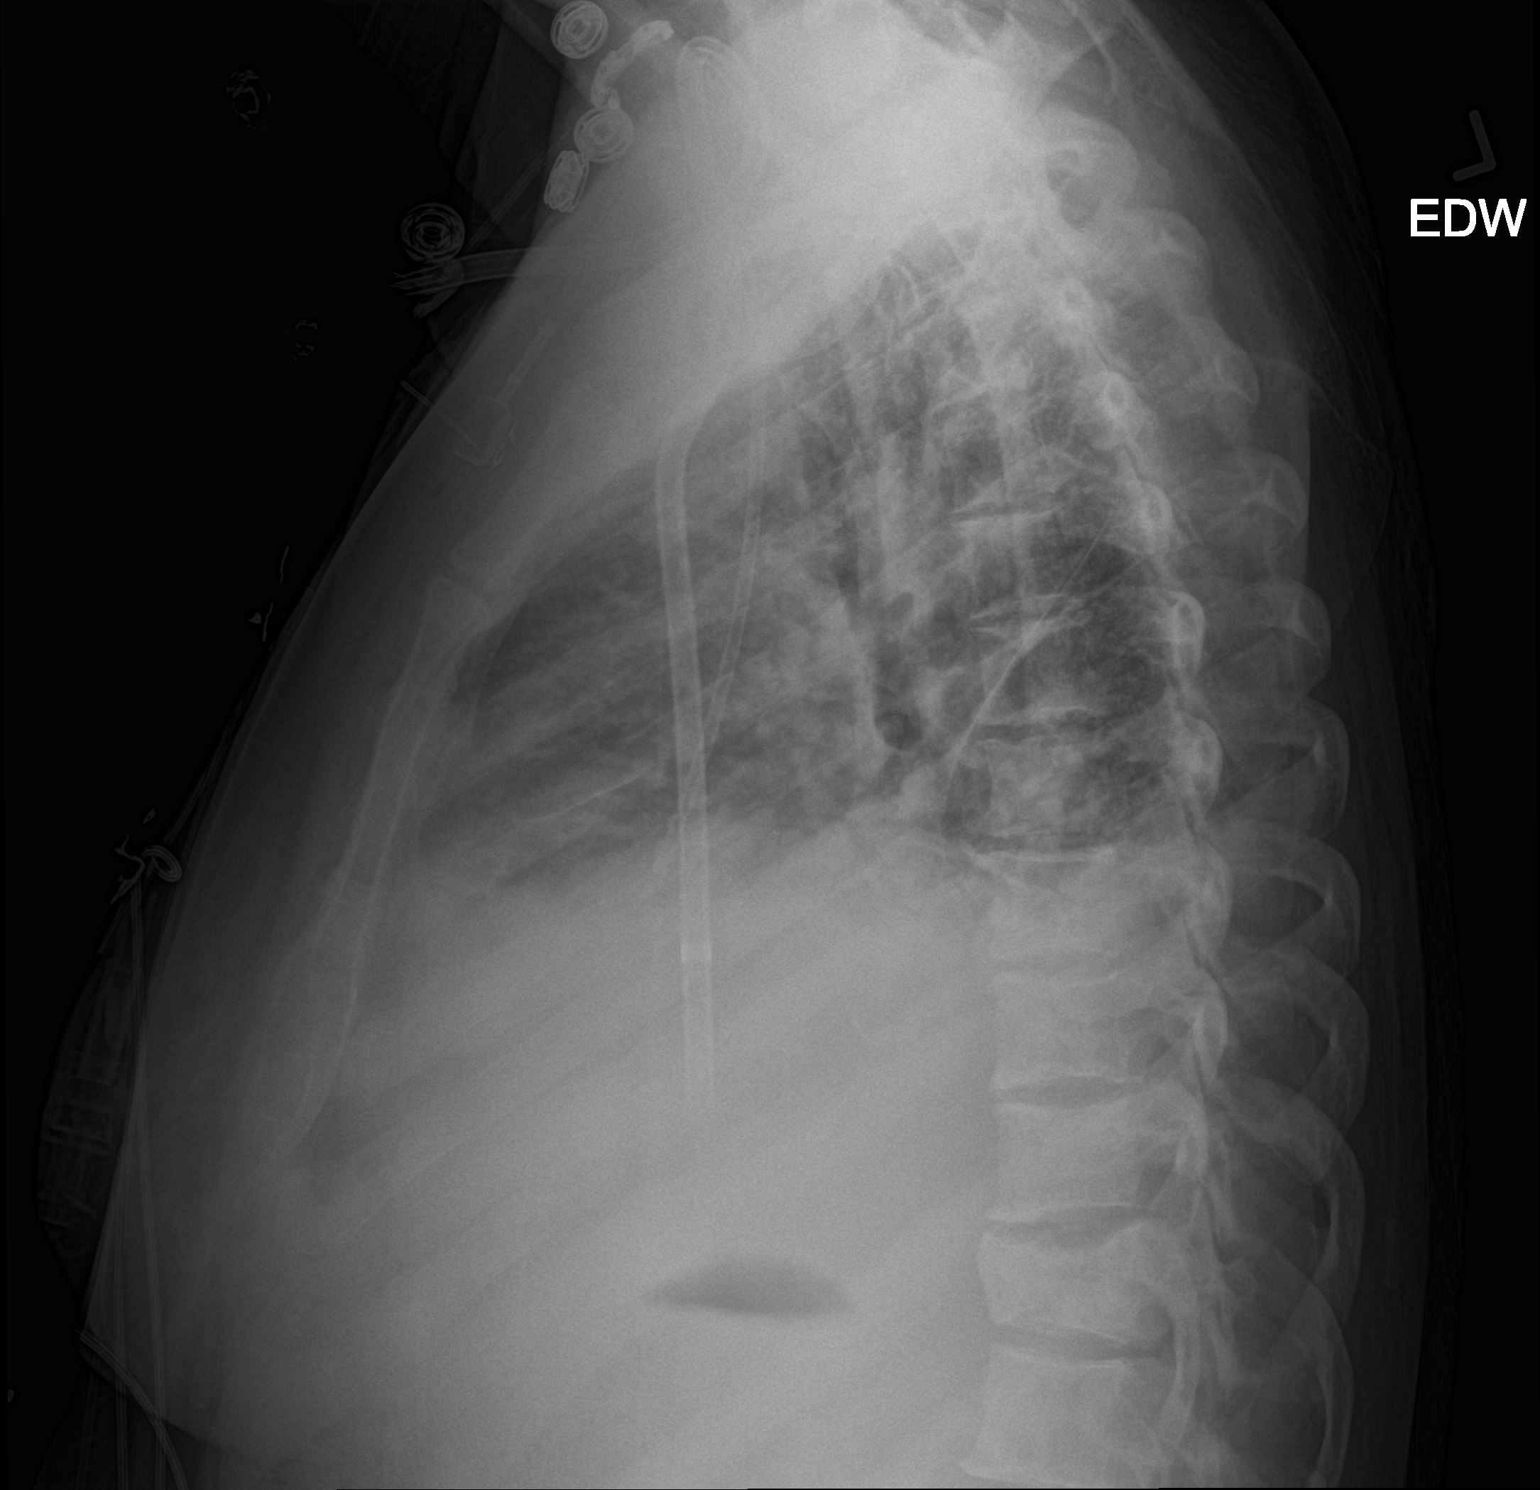

[2 of 2 positions shown; findings below may reference images not displayed]

FINDINGS: Cardiopericardial enlargement that is stable. New dialysis catheter
from the left with tips at the right atrium. Small pleural effusions
and bibasilar atelectasis, increased. No septal thickening to
suggest edema. History of multiple myeloma with spinal sclerosis and
chronic T11 compression fracture
IMPRESSION: Increased pleural effusions and bibasilar atelectasis. Cannot
exclude superimposed pneumonia.

## 2018-08-23 IMAGING — CR DG CHEST 2V
3 series · 3 of 3 positions shown · non-contrast
Comparison: Chest radiograph 11/11/2016

CLINICAL DATA: Patient with shortness of breath. History of
multiple myeloma. End-stage renal disease.

EXAM:
CHEST  2 VIEW

[chest lat (1 of 2)]
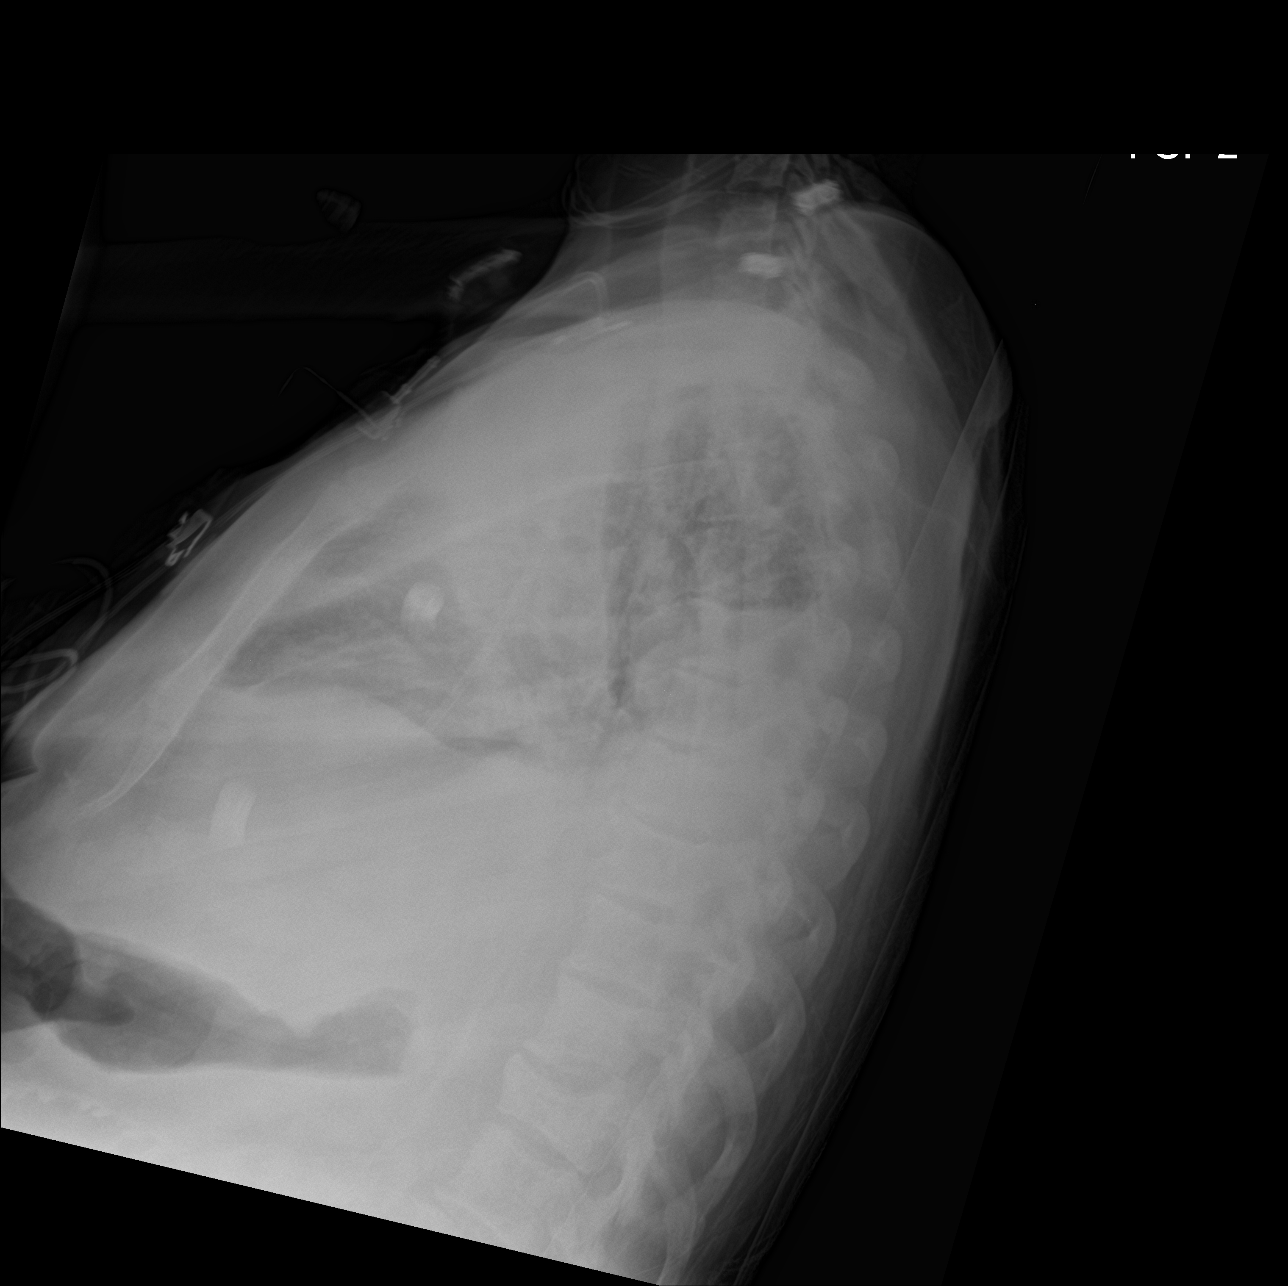

[chest ap]
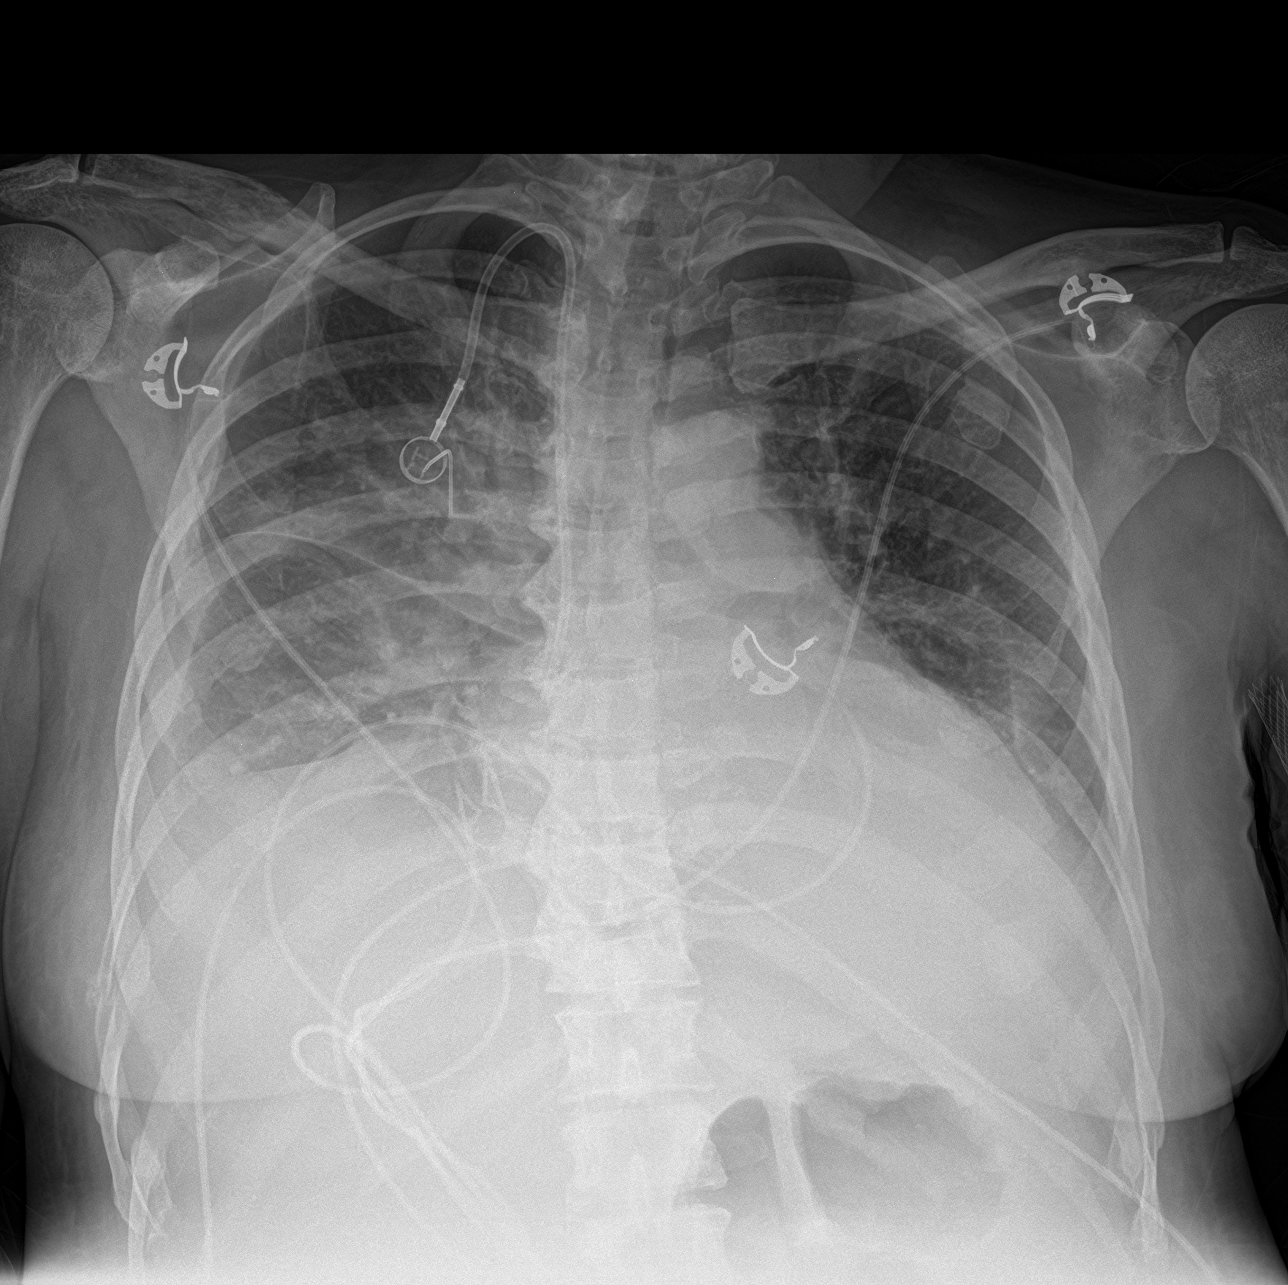

[chest lat (2 of 2)]
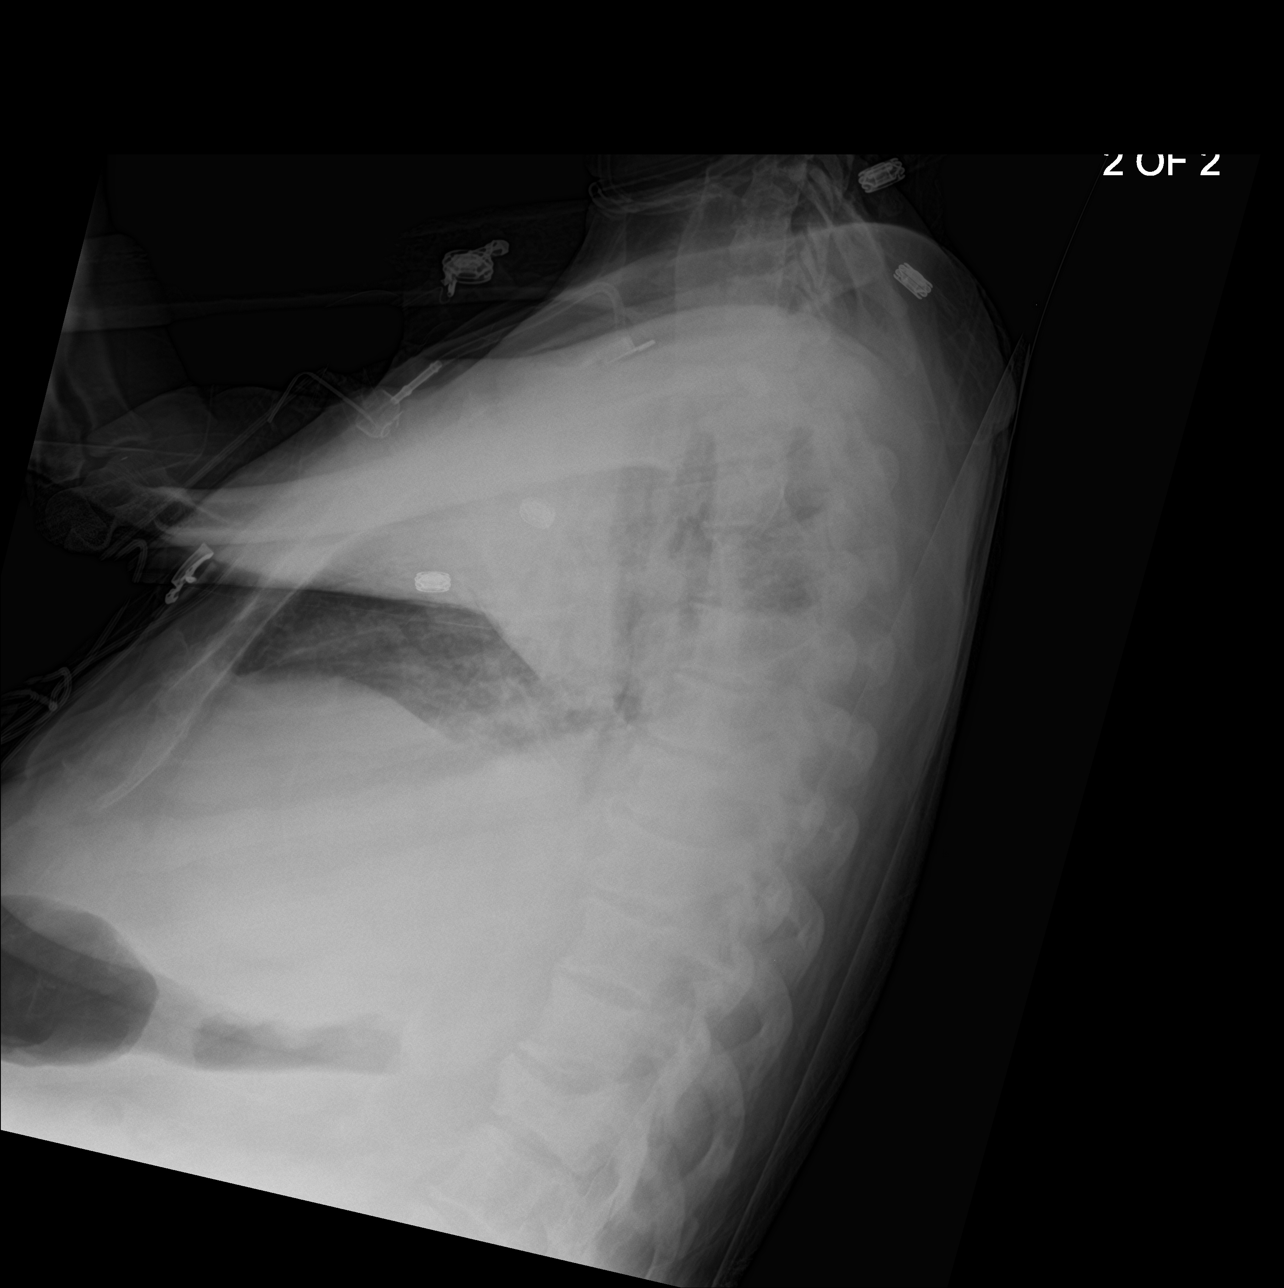

[3 of 3 positions shown; findings below may reference images not displayed]

FINDINGS: Right anterior chest wall Port-A-Cath is present with tip projecting
over the superior vena cava. Monitoring leads overlie the patient.
Stable cardiomegaly. Right-greater-than-left diffuse interstitial
pulmonary opacities. Moderate right and small left layering pleural
effusions. No pneumothorax.
IMPRESSION: Cardiomegaly with pulmonary edema and layering bilateral pleural
effusions.

## 2018-08-24 IMAGING — DX DG KNEE 1-2V*L*
2 series · 2 of 2 positions shown · non-contrast
Comparison: None.

CLINICAL DATA: Pain.  History of multiple myeloma

EXAM:
LEFT KNEE - 1-2 VIEW

[knee ap]
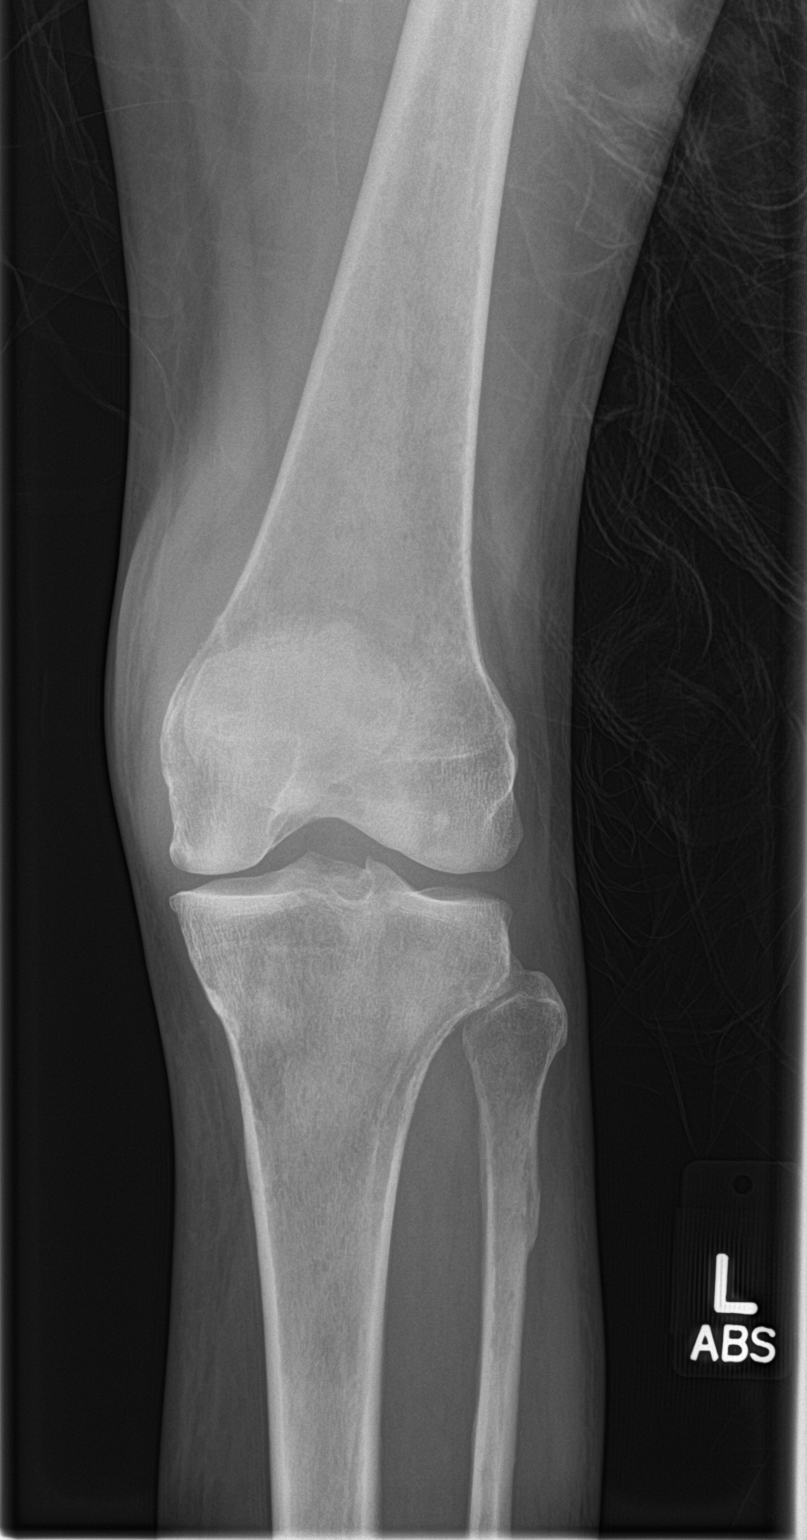

[knee lat]
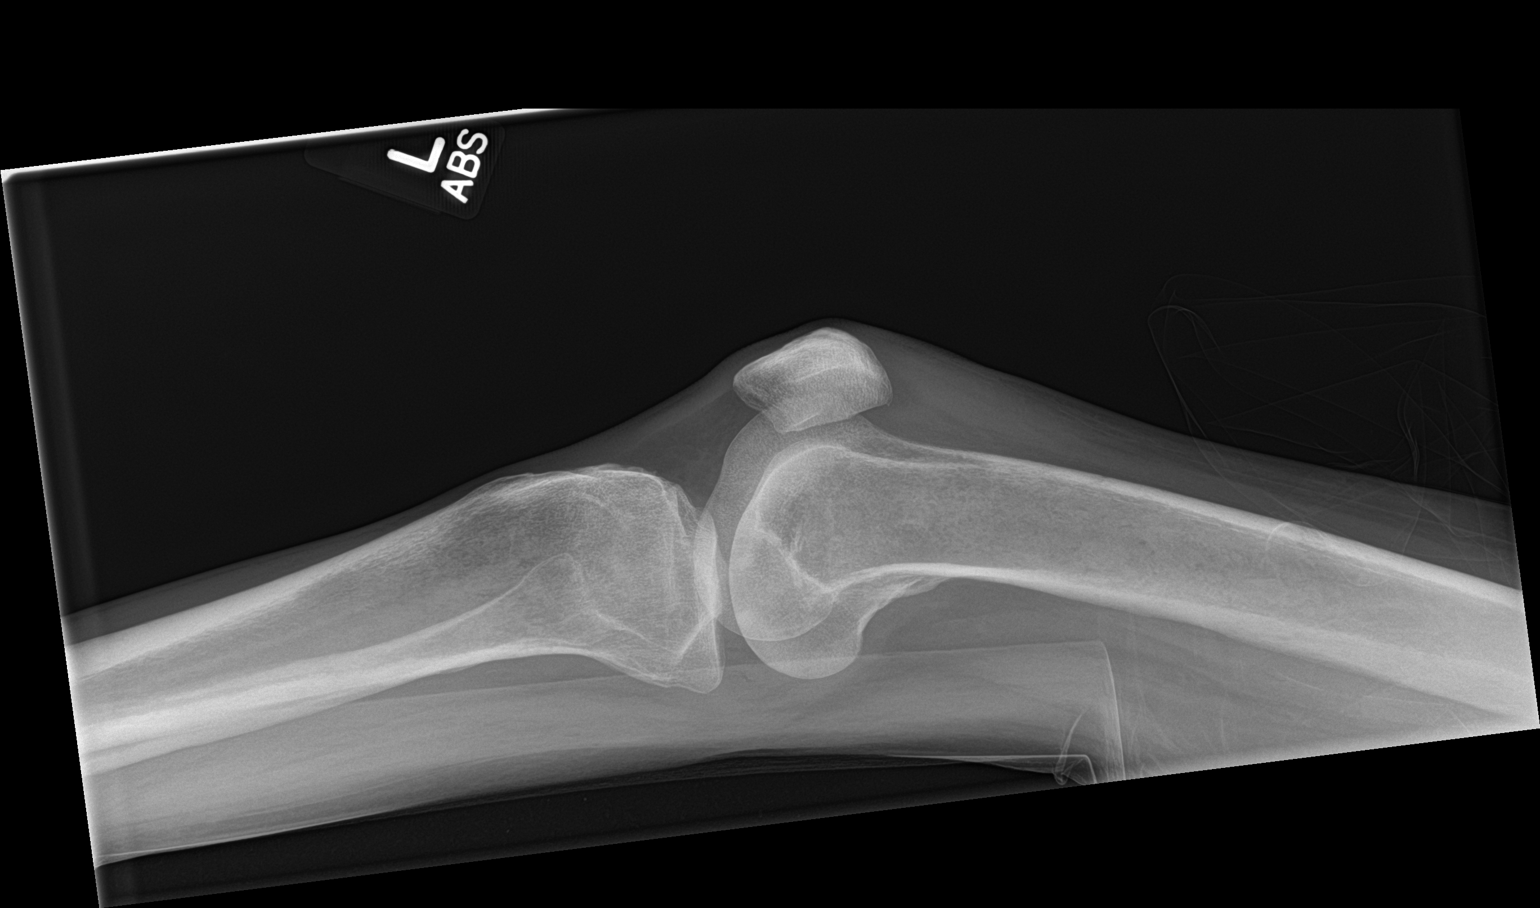

[2 of 2 positions shown; findings below may reference images not displayed]

FINDINGS: Frontal lateral views were obtained it. There is no appreciable
fracture or dislocation. No evident joint effusion. Joint spaces
appear normal. There is slight spurring medially. No blastic or
lytic bone lesions are evident.
IMPRESSION: Mild spurring medially. No appreciable joint space narrowing or
erosion. No fracture or joint effusion.
